# Patient Record
Sex: Female | Born: 1940 | Race: Black or African American | Hispanic: No | Smoking: Current some day smoker
Health system: Southern US, Community
[De-identification: ages and names within clinical notes are randomized; demographics above are authoritative.]

## PROBLEM LIST (undated history)

## (undated) DIAGNOSIS — J84112 Idiopathic pulmonary fibrosis: Secondary | ICD-10-CM

## (undated) DIAGNOSIS — E119 Type 2 diabetes mellitus without complications: Secondary | ICD-10-CM

## (undated) DIAGNOSIS — D696 Thrombocytopenia, unspecified: Secondary | ICD-10-CM

## (undated) DIAGNOSIS — E039 Hypothyroidism, unspecified: Secondary | ICD-10-CM

## (undated) DIAGNOSIS — I10 Essential (primary) hypertension: Secondary | ICD-10-CM

## (undated) DIAGNOSIS — E785 Hyperlipidemia, unspecified: Secondary | ICD-10-CM

## (undated) DIAGNOSIS — I4891 Unspecified atrial fibrillation: Secondary | ICD-10-CM

## (undated) HISTORY — DX: Thrombocytopenia, unspecified: D69.6

## (undated) HISTORY — PX: TUBAL LIGATION: SHX77

## (undated) HISTORY — DX: Hypothyroidism, unspecified: E03.9

## (undated) HISTORY — DX: Hyperlipidemia, unspecified: E78.5

## (undated) HISTORY — DX: Unspecified atrial fibrillation: I48.91

## (undated) HISTORY — DX: Type 2 diabetes mellitus without complications: E11.9

---

## 2003-03-20 ENCOUNTER — Inpatient Hospital Stay (HOSPITAL_COMMUNITY): Admission: EM | Admit: 2003-03-20 | Discharge: 2003-03-21 | Payer: Self-pay | Admitting: Emergency Medicine

## 2003-03-20 ENCOUNTER — Encounter: Payer: Self-pay | Admitting: Emergency Medicine

## 2003-03-21 ENCOUNTER — Encounter: Payer: Self-pay | Admitting: Cardiology

## 2007-06-25 ENCOUNTER — Other Ambulatory Visit: Admission: RE | Admit: 2007-06-25 | Discharge: 2007-06-25 | Payer: Self-pay | Admitting: *Deleted

## 2008-10-05 ENCOUNTER — Ambulatory Visit: Payer: Self-pay | Admitting: Hematology

## 2008-10-09 LAB — MORPHOLOGY
PLT EST: DECREASED
RBC Comments: NORMAL

## 2008-10-09 LAB — CBC WITH DIFFERENTIAL/PLATELET
BASO%: 0.1 % (ref 0.0–2.0)
Eosinophils Absolute: 0 10*3/uL (ref 0.0–0.5)
HCT: 45.9 % (ref 34.8–46.6)
MCHC: 34.5 g/dL (ref 32.0–36.0)
MONO#: 0.4 10*3/uL (ref 0.1–0.9)
NEUT#: 4.6 10*3/uL (ref 1.5–6.5)
NEUT%: 63.6 % (ref 39.6–76.8)
RBC: 4.43 10*6/uL (ref 3.70–5.32)
WBC: 7.2 10*3/uL (ref 3.9–10.0)
lymph#: 2.1 10*3/uL (ref 0.9–3.3)

## 2008-10-09 LAB — CHCC SMEAR

## 2008-10-13 LAB — SPEP & IFE WITH QIG
Albumin ELP: 53.6 % — ABNORMAL LOW (ref 55.8–66.1)
Alpha-2-Globulin: 12.5 % — ABNORMAL HIGH (ref 7.1–11.8)
Beta 2: 5.7 % (ref 3.2–6.5)
Beta Globulin: 5.8 % (ref 4.7–7.2)
IgA: 392 mg/dL — ABNORMAL HIGH (ref 68–378)

## 2008-10-13 LAB — TSH: TSH: 11.328 u[IU]/mL — ABNORMAL HIGH (ref 0.350–4.500)

## 2008-10-13 LAB — T4, FREE: Free T4: 0.65 ng/dL — ABNORMAL LOW (ref 0.89–1.80)

## 2008-10-13 LAB — VITAMIN B12: Vitamin B-12: 837 pg/mL (ref 211–911)

## 2008-10-20 ENCOUNTER — Ambulatory Visit (HOSPITAL_COMMUNITY): Admission: RE | Admit: 2008-10-20 | Discharge: 2008-10-20 | Payer: Self-pay | Admitting: Oncology

## 2008-10-26 ENCOUNTER — Encounter: Admission: RE | Admit: 2008-10-26 | Discharge: 2008-10-26 | Payer: Self-pay | Admitting: Family Medicine

## 2008-12-07 ENCOUNTER — Ambulatory Visit: Payer: Self-pay | Admitting: Hematology

## 2009-04-02 ENCOUNTER — Encounter (HOSPITAL_BASED_OUTPATIENT_CLINIC_OR_DEPARTMENT_OTHER): Admission: RE | Admit: 2009-04-02 | Discharge: 2009-07-01 | Payer: Self-pay | Admitting: General Surgery

## 2011-01-01 ENCOUNTER — Encounter: Payer: Self-pay | Admitting: Oncology

## 2011-04-25 NOTE — Assessment & Plan Note (Signed)
[facility]   NAME:  Elizabeth Melton, Elizabeth Melton NO.:  [id number]   MEDICAL RECORD NO.:  [id number]      DATE OF BIRTH:  June 08, 1941   PHYSICIAN:  Kathrin Penner, M.D.    VISIT DATE:  04/12/2009                                   OFFICE VISIT   PROBLEM:  Traumatic laceration of the right lower extremity over the  shin anteromedially, on last measurement was 0.9 x 0.5 x 0.1.   The patient is a diabetic female who hurt her leg on the [employer] stove.  The ulcer had been present previously for approximately 2 months.   Last treatment, she was selectively debrided and treated with Promogran  and hydrogel 3 times weekly.  The patient returns today for followup  evaluation.   PHYSICAL EXAMINATION:  VITAL SIGNS:  Temperature 99.1, pulse 84,  respirations 18, and blood pressure 147/75.  Capillary blood glucose  119.  The wound is now healed.  The base of the wound is completely  epithelized with return of melanin-pigmented cells.  There is no  erythema or edema.   ASSESSMENT:  Healed traumatic wound of the right anterior leg.   PLAN:  To discharge from clinic.  Followup p.r.n.      Kathrin Penner, M.D.  Electronically Signed     PB/MEDQ  D:  04/12/2009  T:  04/13/2009  Job:  [id number]

## 2011-04-25 NOTE — Assessment & Plan Note (Signed)
[facility]   NAME:  Elizabeth Melton, KIGER NO.:  [id number]   MEDICAL RECORD NO.:  [id number]      DATE OF BIRTH:  1941-03-24   PHYSICIAN:  Kathrin Penner, M.D.    VISIT DATE:  04/05/2009                                   OFFICE VISIT   PROBLEM:  Traumatic laceration of the right lower extremity (shin);  wound size 0.9 x 0.5 x 0.1 cm; onset of injury, March 2010.   The patient is a 70 year old female with history of recent onset of  diabetes mellitus, which is currently being controlled on diet.  The  patient noted that she cut her shin on the stove in March of this year  and now she has a nonhealing pretibial ulcer.   PAST MEDICAL HISTORY:  The patient has had no previous surgical  operations.   CURRENT MEDICATIONS:  1. Lovastatin 20 mg daily.  2. Levothyroxine 25 mcg daily.  3. Fish oil 1000 mg daily.  4. Vitamin D3 2000 units daily.  5. B complex.   This patient is a known recent diabetic.  She has a history of  hypertension.   ALLERGIES:  She has no known drug allergies.   SOCIAL HISTORY:  She is a married black female.  She smokes  approximately one-half pack of cigarettes daily.  She drinks alcohol two  to thrice 3 times weekly.   REVIEW OF SYSTEMS:  Twelve-point review of systems is carried out and is  negative, except as noted above.   PHYSICAL EXAMINATION:  GENERAL:  This patient is 5 feet 6 inches tall  and weight 160 pounds.  She has had some recent weight gain.  VITAL SIGNS:  Temperature is 99.1, pulse is 71, respirations 18, blood  pressure 144/76.  Her most recent capillary blood glucose is 162.  HEENT/NECK:  There is no thyromegaly and no cervical adenopathy.  No  carotid bruits.  No scleral icterus.  CARDIOPULMONARY:  Lungs are clear to auscultation bilaterally.  Heart  shows regular rate and rhythm without murmurs, rubs, or gallops.  ABDOMEN:  Normoactive bowel sounds with no palpable masses or  visceromegaly.  There  are no hernias noted.  EXTREMITIES:  Right pretibial ulcer with necrotic base.  No odor and  with minimal exudate.   TREATMENT:  Selective debridement of this ulcer following topical  anesthetic with lidocaine gel and Promogran hydrogel wound dressing, to  be changed 3 times weekly by the patient.   DISPOSITION:  Return to clinic in 1 week for wound care.  Referral to  the diabetic teaching service.      Kathrin Penner, M.D.  Electronically Signed     PB/MEDQ  D:  04/06/2009  T:  04/06/2009  Job:  [id number]

## 2011-04-26 ENCOUNTER — Other Ambulatory Visit (HOSPITAL_COMMUNITY)
Admission: RE | Admit: 2011-04-26 | Discharge: 2011-04-26 | Disposition: A | Discharge status: Home / Self Care | Payer: Medicare Other | Source: Ambulatory Visit | Attending: Family Medicine | Admitting: Family Medicine

## 2011-04-26 DIAGNOSIS — Z124 Encounter for screening for malignant neoplasm of cervix: Secondary | ICD-10-CM | POA: Insufficient documentation

## 2011-04-28 NOTE — Discharge Summary (Signed)
   NAMESAMANTHAMARIE, EZZELL NO.:  [id number]   MEDICAL RECORD NO.:  [id number]                   PATIENT TYPE:  INP   LOCATION:  2001                                 FACILITY:  [place]   PHYSICIAN:  Satira Sark, M.D. [facility]        DATE OF BIRTH:  Apr 10, 1941   DATE OF ADMISSION:  03/20/2003  DATE OF DISCHARGE:  03/21/2003                           DISCHARGE SUMMARY - REFERRING   DISCHARGE DIAGNOSES:  1. Chest pain.  2. Short PR interval.  3. Hyperthyroidism status post radioiodine ablation in 1980.  4. History of laceration to the tip of the right index finger secondary to     machine accident in the 1980s.  5. Tobacco abuse.   HOSPITAL COURSE:  The patient is a 70 year old female who was admitted on  03/20/2003 with a six month history of progressive angina.  She was admitted  on 03/20/2003 and underwent further cardiac testing which included a cardiac  enzymes which were negative.  Sodium 140, potassium 3.7, BUN 12, creatinine  1.0.  Hemoglobin 14.0, hematocrit 41.7.  Total cholesterol 183.  Triglycerides 117.  HDL 51, LDL 109.  TSH 21.205 and hemoglobin A-1C 6.2.  She underwent a stress Cardiolite that revealed no ischemia with a normal  ejection fraction.  She was also noted on the EKG to have a short PR  interval, and there was a question of __________  .  After an overnight stay  in the hospital the patient was discharged to home in stable condition, and  she will need to have a Holter and echo arranged in the office.  In addition  at discharge, her TSH with a stat hemoglobin A-1C status __________  needed  to be followed up as an outpatient.  She will be contacted by the office for  follow up appointment and at this time her labs will need to be reviewed  with her.   PREADMISSION MEDICATIONS:  She is to resume her medications that she was  taking prior to admission which included over the counter medicine.   DISPOSITION:  She is discharged  home in stable condition.      Joesphine Bare, P.A. [facility]                      Satira Sark, M.D. [facility]    LB/MEDQ  D:  04/15/2003  T:  04/15/2003  Job:  [id number]

## 2011-04-28 NOTE — H&P (Signed)
Elizabeth Melton, OYSTER NO.:  [id number]   MEDICAL RECORD NO.:  [id number]                   PATIENT TYPE:  EMS   LOCATION:  [facility]                                 FACILITY:  [place]   PHYSICIAN:  Satira Sark, M.D. [facility]        DATE OF BIRTH:  Jan 16, 1941   DATE OF ADMISSION:  03/20/2003  DATE OF DISCHARGE:                                HISTORY & PHYSICAL   PRIMARY CARE PHYSICIAN:  None.   CHIEF COMPLAINT:  Chest pain.   HISTORY OF PRESENT ILLNESS:  The patient is a 70 year old woman with a  longstanding history of tobacco abuse and reported previous history of  hyperthyroidism, status post radioiodine ablation in the late 1980s, who  presents with a six-month history of progressive chest discomfort.  She  describes a sensation of palpitations with pain in her left scapular area  which radiates around the left breast and ultimately to the left arm with a  feeling of paresthesia.  Her chest pain feels like a squeezing and is also  associated with dyspnea.  Typically these symptoms last about five minutes  and do not occur in any specific pattern or with any specific precipitant.  She was at work today and developed an episode that was more prolonged,  lasting up to three hours, which was unusual.  She presented to the  emergency department after being referred to urgent care.  At present, she  feels better.  She does not have any typical exertional symptomatology and  has no known history of coronary artery disease or myocardial infarction.  She does not have regular medical care and has had no prior screening for  coronary artery disease.   ALLERGIES:  No known drug allergies.   CURRENT MEDICATIONS:  No prescription medicines are taken.  The patient uses  over the counter sinus preparations.   PAST MEDICAL HISTORY:  1. Reported history of hyperthyroidism, status post radioiodine ablation in     the late 1980s.  The patient is not on Synthroid  replacement at this time     and she has not had her thyroid status checked in many years.  2. History of laceration with loss of the tip of the right index finger     secondary to a machine accident in the 1980s.  3. Unknown lipid status.  4. No known history of hypertension or type 2 diabetes mellitus.   SOCIAL HISTORY:  The patient is married and has three grown children.  She  lives in [place], [place].  She works at [employer] on an  [employer].  She has smoked one pack per day for approximately 45 years.  She drinks alcohol regularly, admitting to one or two drinks each evening  after work, perhaps more on the weekends.  She denies any illicit drug use.   FAMILY HISTORY:  Noncontributory for premature coronary artery disease,  arrhythmia, or sudden death.   REVIEW OF  SYSTEMS:  As described in the history of present illness.  The  patient complains of chronic pollen allergies and sinus congestion.  She has  had no recent fever or chills.  She denies productive cough, nausea, emesis,  melena, hematochezia, or changes in bowel or bladder habits.   PHYSICAL EXAMINATION:  VITAL SIGNS:  The blood pressure is 140/80, heart  rate 60 and regular, respirations 24 initially and down to 18, and the  patient is afebrile.  WEIGHT:  165 pounds.  HEIGHT:  5 feet 6 inches tall.  GENERAL APPEARANCE:  This is an overweight woman lying supine in no acute  distress.  HEENT:  Conjunctivae and lids normal.  The oropharynx is clear.  NECK:  Supple without elevated jugular venous pressure, thyromegaly, or  thyroid tenderness.  No carotid bruits are noted.  LUNGS:  Clear to auscultation bilaterally with no wheezing or rhonchi.  CARDIAC:  Regular rate and rhythm without S3 gallop or significant murmur.  There is no pericardial rub.  ABDOMEN:  Obese without hepatomegaly or bruits.  EXTREMITIES:  No significant cyanosis, clubbing, or edema.  Pulses are 2+.  SKIN:  No ulcerative changes  are noted.  MUSCULOSKELETAL:  No kyphosis is noted.  NEUROPSYCHIATRIC:  The patient is alert and oriented x 3.   LABORATORY DATA:  The 12-lead electrocardiogram today shows normal sinus  rhythm with a short PR interval measuring 129 msec.  There are no clear  delta waves noted.  There are nonspecific inferolateral T-wave changes with  predominantly flattening, but some inversion in lead 3.   The initial CK was 304, CK-MB 4.1, relative index 1.3, and troponin I less  than 0.01.  BUN 7, creatinine 0.9.  Liver function tests are normal.  The  potassium is 3.9.  The INR is 0.9.  The WBC is 6.9, hemoglobin 14.2, and  platelets 151.   The chest x-ray is currently pending.   IMPRESSION:  1. Progressive chest pain syndrome over the last six months with typical and     atypical features in a 70 year old woman with longstanding tobacco use     and reported history of prior hyperthyroidism, status post radioiodine     ablation.  She had a more prolonged episode today at rest.  The     electrocardiogram is nonspecific, but does show a short PR interval with     no clear delta waves.  She has had no arrhythmias on telemetry.  The     initial troponin I is negative.  2. Unknown lipid status.  3. Mildly elevated glucose at 120 on random check today.  The patient denies     any prior history of type 2 diabetes mellitus.  4. Unknown thyroid status following remote radioiodine ablation for     hyperthyroidism in the late 1980s.   PLAN:  1. Will admit the patient to telemetry, cycle cardiac markers, and     tentatively plan on an exercise Cardiolite for tomorrow, assuming the     patient rules out for myocardial infarction.  2. Follow telemetry for any specific arrhythmias, specifically given the     short PR interval.  There is a possibility of a preexcitation syndrome,     although this is not clearly defined at this time. 3. Would check a TSH, fasting lipid profile, and a hemoglobin A1C.  4.  May ultimately need an event monitor and 2-D echocardiography as an     outpatient if the above work-up is reassuring.  Satira Sark, M.D. [facility]    SGM/MEDQ  D:  03/20/2003  T:  03/20/2003  Job:  [id number]

## 2012-08-26 ENCOUNTER — Other Ambulatory Visit: Payer: Self-pay

## 2012-08-26 DIAGNOSIS — I739 Peripheral vascular disease, unspecified: Secondary | ICD-10-CM

## 2012-09-20 ENCOUNTER — Encounter: Payer: Self-pay | Admitting: Vascular Surgery

## 2012-09-23 ENCOUNTER — Encounter: Payer: Self-pay | Admitting: Vascular Surgery

## 2012-09-24 ENCOUNTER — Encounter (INDEPENDENT_AMBULATORY_CARE_PROVIDER_SITE_OTHER): Payer: Medicare Other | Admitting: *Deleted

## 2012-09-24 ENCOUNTER — Encounter: Payer: Self-pay | Admitting: Vascular Surgery

## 2012-09-24 ENCOUNTER — Ambulatory Visit (INDEPENDENT_AMBULATORY_CARE_PROVIDER_SITE_OTHER): Payer: Medicare Other | Admitting: Vascular Surgery

## 2012-09-24 VITALS — BP 167/75 | HR 71 | Resp 20 | Ht 66.5 in | Wt 160.0 lb

## 2012-09-24 DIAGNOSIS — M79606 Pain in leg, unspecified: Secondary | ICD-10-CM | POA: Insufficient documentation

## 2012-09-24 DIAGNOSIS — I739 Peripheral vascular disease, unspecified: Secondary | ICD-10-CM

## 2012-09-24 DIAGNOSIS — M79609 Pain in unspecified limb: Secondary | ICD-10-CM

## 2012-09-24 NOTE — Progress Notes (Signed)
[facility]   Patient name: Elizabeth Melton MRN: [id number] DOB: 09-Jan-1941 Sex: female   Referred by: Drema Dallas  Reason for referral:  Chief Complaint  Patient presents with  . New Evaluation    C/O LEG PAIN AND CRAMPING    HISTORY OF PRESENT ILLNESS: The patient reports that he had diffuse unusual complex of lower extremity complaints. She reports feeling as though something is moving inside the calf muscles on her right leg greater than left. It appears though she is describing muscle spasms or fasciculation in her calf. She also has complaints of numbness in both thighs. This is greater on the left side in the right. She reports this is worse when she is standing and reports no relationship with this with walking. She was a reports fairly typical bilateral night cramps. Does have a history of prior diabetes and hypertension. No history of DVT or lower extremity tissue loss.  Past Medical History  Diagnosis Date  . Hypothyroidism   . Hyperlipidemia   . Diabetes mellitus without complication   . Thrombocytopenia     Past Surgical History  Procedure Date  . Tubal ligation     History   Social History  . Marital Status: Married    Spouse Name: N/A    Number of Children: N/A  . Years of Education: N/A   Occupational History  . Not on file.   Social History Main Topics  . Smoking status: Current Some Day Smoker -- 50 years    Types: Cigarettes  . Smokeless tobacco: Former Systems developer    Quit date: 09/24/1962  . Alcohol Use: Yes  . Drug Use: No  . Sexually Active: Not on file   Other Topics Concern  . Not on file   Social History Narrative  . No narrative on file    Family History  Problem Relation Age of Onset  . Diabetes Mother   . Kidney disease Mother   . Other Father     tuberculosis    Allergies as of 09/24/2012  . (No Known Allergies)    Current Outpatient Prescriptions on File Prior to Visit  Medication Sig Dispense  Refill  . acetaminophen (TYLENOL) 500 MG tablet Take 500 mg by mouth every 6 (six) hours as needed.      Marland Kitchen b complex vitamins tablet Take 1 tablet by mouth daily.      . Calcium Carb-Cholecalciferol (CALCIUM + D3) 600-200 MG-UNIT TABS Take by mouth.      Marland Kitchen glucose blood test strip 1 each by Other route as needed. Use as instructed      . levothyroxine (SYNTHROID, LEVOTHROID) 75 MCG tablet Take 75 mcg by mouth daily.      Marland Kitchen lovastatin (MEVACOR) 40 MG tablet Take 40 mg by mouth at bedtime.      Marland Kitchen SMART SENSE THIN LANCETS 26G MISC by Does not apply route.         REVIEW OF SYSTEMS:  Positives indicated with an "X"  CARDIOVASCULAR:  _0  chest pain   _1  chest pressure   _2  palpitations   _3  orthopnea   _4  dyspnea on exertion   _5  claudication   _6  rest pain   _7  DVT   _8  phlebitis PULMONARY:   _9  productive cough   _10  asthma   _11  wheezing NEUROLOGIC:   _12  weakness  [x ] paresthesias  _13  aphasia  _14   amaurosis  [x ] dizziness HEMATOLOGIC:   _0  bleeding problems   _1  clotting disorders MUSCULOSKELETAL:  _2  joint pain   _3  joint swelling GASTROINTESTINAL: _4   blood in stool  _5   hematemesis GENITOURINARY:  _6   dysuria  _7   hematuria PSYCHIATRIC:  _8  history of major depression INTEGUMENTARY:  [x ] rashes  _9  ulcers CONSTITUTIONAL:  _10  fever   [x ] chills  PHYSICAL EXAMINATION:  General: The patient is a well-nourished female, in no acute distress. Vital signs are BP 167/75  Pulse 71  Resp 20  Ht 5' 6.5" (1.689 m)  Wt 160 lb (72.576 kg)  BMI 25.44 kg/m2 Pulmonary: There is a good air exchange bilaterally without wheezing or rales. Abdomen: Soft and non-tender with normal pitch bowel sounds. Musculoskeletal: There are no major deformities.  There is no significant extremity pain. Neurologic: No focal weakness or paresthesias are detected, Skin: There are no ulcer or rashes noted. Psychiatric: The patient has normal affect. Cardiovascular: There is a regular  rate and rhythm without significant murmur appreciated. Pulse status: 2+ radial pulses bilaterally. She has 2+ femoral pulses bilaterally. 2+ left dorsalis pedis pulse and 1+ left posterior tibial pulse. No palpable pedal pulses on the right  I have reviewed lower extremity ankle arm indices from inside imaging on 06/12/2012. Right ankle arm index was 0.5 left 0.78 [facility] Vascular Lab Studies:  Ordered and Independently Reviewed she had duplex imaging of her lower trim of these. This reveals bilateral superficial femoral artery stenoses greater in the right than on the left. She has biphasic waveforms at the tibial vessels on the left and monophasic on the right.  Impression and Plan:  Had a long discussion with the patient. I do not feel that any of her current symptoms are related to her superficial femoral occlusive disease. I explained that certainly would not explain her numbness in her thighs and the fasciculations in her calves. On further questioning she specifically denies any claudication type symptoms. I reassured her that she is a not in the limb threatening ischemia or level of arterial insufficiency. I explained risk factor modification such as smoking cessation. Patient understands. She will notify should she develop any tissue loss or worsening symptoms. Otherwise she will see [place] on an as-needed basis    Emigdio Wildeman [facility] Office: [phone]

## 2014-03-22 ENCOUNTER — Encounter: Payer: Self-pay | Admitting: *Deleted

## 2014-03-22 DIAGNOSIS — E119 Type 2 diabetes mellitus without complications: Secondary | ICD-10-CM

## 2014-03-22 DIAGNOSIS — E1169 Type 2 diabetes mellitus with other specified complication: Secondary | ICD-10-CM | POA: Insufficient documentation

## 2014-03-22 DIAGNOSIS — E1165 Type 2 diabetes mellitus with hyperglycemia: Secondary | ICD-10-CM | POA: Insufficient documentation

## 2014-03-22 DIAGNOSIS — E039 Hypothyroidism, unspecified: Secondary | ICD-10-CM | POA: Insufficient documentation

## 2014-03-22 DIAGNOSIS — E785 Hyperlipidemia, unspecified: Secondary | ICD-10-CM

## 2014-12-16 ENCOUNTER — Other Ambulatory Visit: Payer: Self-pay | Admitting: Family Medicine

## 2014-12-16 ENCOUNTER — Ambulatory Visit
Admission: RE | Admit: 2014-12-16 | Discharge: 2014-12-16 | Disposition: A | Discharge status: Home / Self Care | Payer: Commercial Managed Care - HMO | Source: Ambulatory Visit | Attending: Family Medicine | Admitting: Family Medicine

## 2014-12-16 DIAGNOSIS — L989 Disorder of the skin and subcutaneous tissue, unspecified: Secondary | ICD-10-CM

## 2014-12-16 DIAGNOSIS — M7989 Other specified soft tissue disorders: Secondary | ICD-10-CM | POA: Diagnosis not present

## 2015-04-22 DIAGNOSIS — M899 Disorder of bone, unspecified: Secondary | ICD-10-CM | POA: Diagnosis not present

## 2015-04-22 DIAGNOSIS — Z1231 Encounter for screening mammogram for malignant neoplasm of breast: Secondary | ICD-10-CM | POA: Diagnosis not present

## 2015-06-07 DIAGNOSIS — E039 Hypothyroidism, unspecified: Secondary | ICD-10-CM | POA: Diagnosis not present

## 2015-06-07 DIAGNOSIS — E119 Type 2 diabetes mellitus without complications: Secondary | ICD-10-CM | POA: Diagnosis not present

## 2015-06-07 DIAGNOSIS — E559 Vitamin D deficiency, unspecified: Secondary | ICD-10-CM | POA: Diagnosis not present

## 2015-06-07 DIAGNOSIS — Z1211 Encounter for screening for malignant neoplasm of colon: Secondary | ICD-10-CM | POA: Diagnosis not present

## 2015-06-07 DIAGNOSIS — Z0000 Encounter for general adult medical examination without abnormal findings: Secondary | ICD-10-CM | POA: Diagnosis not present

## 2015-06-07 DIAGNOSIS — E785 Hyperlipidemia, unspecified: Secondary | ICD-10-CM | POA: Diagnosis not present

## 2015-06-07 DIAGNOSIS — D696 Thrombocytopenia, unspecified: Secondary | ICD-10-CM | POA: Diagnosis not present

## 2015-06-07 DIAGNOSIS — Z1389 Encounter for screening for other disorder: Secondary | ICD-10-CM | POA: Diagnosis not present

## 2015-09-09 DIAGNOSIS — R7989 Other specified abnormal findings of blood chemistry: Secondary | ICD-10-CM | POA: Diagnosis not present

## 2015-09-09 DIAGNOSIS — E119 Type 2 diabetes mellitus without complications: Secondary | ICD-10-CM | POA: Diagnosis not present

## 2015-09-09 DIAGNOSIS — E559 Vitamin D deficiency, unspecified: Secondary | ICD-10-CM | POA: Diagnosis not present

## 2015-09-09 DIAGNOSIS — R740 Nonspecific elevation of levels of transaminase and lactic acid dehydrogenase [LDH]: Secondary | ICD-10-CM | POA: Diagnosis not present

## 2016-01-06 DIAGNOSIS — M25569 Pain in unspecified knee: Secondary | ICD-10-CM | POA: Diagnosis not present

## 2016-01-10 DIAGNOSIS — M76892 Other specified enthesopathies of left lower limb, excluding foot: Secondary | ICD-10-CM | POA: Diagnosis not present

## 2016-01-10 DIAGNOSIS — M1712 Unilateral primary osteoarthritis, left knee: Secondary | ICD-10-CM | POA: Diagnosis not present

## 2016-01-10 DIAGNOSIS — M170 Bilateral primary osteoarthritis of knee: Secondary | ICD-10-CM | POA: Diagnosis not present

## 2016-01-19 DIAGNOSIS — M1712 Unilateral primary osteoarthritis, left knee: Secondary | ICD-10-CM | POA: Diagnosis not present

## 2016-01-24 DIAGNOSIS — M1712 Unilateral primary osteoarthritis, left knee: Secondary | ICD-10-CM | POA: Diagnosis not present

## 2016-01-26 DIAGNOSIS — M1712 Unilateral primary osteoarthritis, left knee: Secondary | ICD-10-CM | POA: Diagnosis not present

## 2016-01-31 DIAGNOSIS — M1712 Unilateral primary osteoarthritis, left knee: Secondary | ICD-10-CM | POA: Diagnosis not present

## 2016-02-02 DIAGNOSIS — M1712 Unilateral primary osteoarthritis, left knee: Secondary | ICD-10-CM | POA: Diagnosis not present

## 2016-02-21 DIAGNOSIS — M76892 Other specified enthesopathies of left lower limb, excluding foot: Secondary | ICD-10-CM | POA: Diagnosis not present

## 2016-02-21 DIAGNOSIS — M1712 Unilateral primary osteoarthritis, left knee: Secondary | ICD-10-CM | POA: Diagnosis not present

## 2016-05-19 DIAGNOSIS — Z1231 Encounter for screening mammogram for malignant neoplasm of breast: Secondary | ICD-10-CM | POA: Diagnosis not present

## 2016-06-07 DIAGNOSIS — E785 Hyperlipidemia, unspecified: Secondary | ICD-10-CM | POA: Diagnosis not present

## 2016-06-07 DIAGNOSIS — E039 Hypothyroidism, unspecified: Secondary | ICD-10-CM | POA: Diagnosis not present

## 2016-06-07 DIAGNOSIS — D696 Thrombocytopenia, unspecified: Secondary | ICD-10-CM | POA: Diagnosis not present

## 2016-06-07 DIAGNOSIS — Z1389 Encounter for screening for other disorder: Secondary | ICD-10-CM | POA: Diagnosis not present

## 2016-06-07 DIAGNOSIS — Z0000 Encounter for general adult medical examination without abnormal findings: Secondary | ICD-10-CM | POA: Diagnosis not present

## 2016-06-07 DIAGNOSIS — E559 Vitamin D deficiency, unspecified: Secondary | ICD-10-CM | POA: Diagnosis not present

## 2016-06-07 DIAGNOSIS — H538 Other visual disturbances: Secondary | ICD-10-CM | POA: Diagnosis not present

## 2016-06-07 DIAGNOSIS — R011 Cardiac murmur, unspecified: Secondary | ICD-10-CM | POA: Diagnosis not present

## 2016-06-07 DIAGNOSIS — E119 Type 2 diabetes mellitus without complications: Secondary | ICD-10-CM | POA: Diagnosis not present

## 2016-06-14 ENCOUNTER — Other Ambulatory Visit (HOSPITAL_COMMUNITY): Payer: Self-pay | Admitting: Family Medicine

## 2016-06-14 DIAGNOSIS — R011 Cardiac murmur, unspecified: Secondary | ICD-10-CM

## 2016-06-29 ENCOUNTER — Ambulatory Visit (HOSPITAL_COMMUNITY): Payer: Commercial Managed Care - HMO | Attending: Cardiology

## 2016-06-29 ENCOUNTER — Other Ambulatory Visit: Payer: Self-pay

## 2016-06-29 DIAGNOSIS — I340 Nonrheumatic mitral (valve) insufficiency: Secondary | ICD-10-CM | POA: Insufficient documentation

## 2016-06-29 DIAGNOSIS — R011 Cardiac murmur, unspecified: Secondary | ICD-10-CM | POA: Diagnosis not present

## 2016-06-29 DIAGNOSIS — I351 Nonrheumatic aortic (valve) insufficiency: Secondary | ICD-10-CM | POA: Insufficient documentation

## 2016-06-29 DIAGNOSIS — E119 Type 2 diabetes mellitus without complications: Secondary | ICD-10-CM | POA: Insufficient documentation

## 2016-06-29 DIAGNOSIS — I517 Cardiomegaly: Secondary | ICD-10-CM | POA: Diagnosis not present

## 2016-06-29 DIAGNOSIS — E785 Hyperlipidemia, unspecified: Secondary | ICD-10-CM | POA: Diagnosis not present

## 2016-06-29 DIAGNOSIS — Z720 Tobacco use: Secondary | ICD-10-CM | POA: Diagnosis not present

## 2016-08-07 DIAGNOSIS — E119 Type 2 diabetes mellitus without complications: Secondary | ICD-10-CM | POA: Diagnosis not present

## 2017-04-30 DIAGNOSIS — M810 Age-related osteoporosis without current pathological fracture: Secondary | ICD-10-CM | POA: Diagnosis not present

## 2017-04-30 DIAGNOSIS — M85852 Other specified disorders of bone density and structure, left thigh: Secondary | ICD-10-CM | POA: Diagnosis not present

## 2017-05-21 DIAGNOSIS — Z1231 Encounter for screening mammogram for malignant neoplasm of breast: Secondary | ICD-10-CM | POA: Diagnosis not present

## 2017-06-19 DIAGNOSIS — Z0000 Encounter for general adult medical examination without abnormal findings: Secondary | ICD-10-CM | POA: Diagnosis not present

## 2017-06-19 DIAGNOSIS — E785 Hyperlipidemia, unspecified: Secondary | ICD-10-CM | POA: Diagnosis not present

## 2017-06-19 DIAGNOSIS — E039 Hypothyroidism, unspecified: Secondary | ICD-10-CM | POA: Diagnosis not present

## 2017-06-19 DIAGNOSIS — Z1389 Encounter for screening for other disorder: Secondary | ICD-10-CM | POA: Diagnosis not present

## 2017-06-19 DIAGNOSIS — Z720 Tobacco use: Secondary | ICD-10-CM | POA: Diagnosis not present

## 2017-06-19 DIAGNOSIS — M810 Age-related osteoporosis without current pathological fracture: Secondary | ICD-10-CM | POA: Diagnosis not present

## 2017-06-19 DIAGNOSIS — E119 Type 2 diabetes mellitus without complications: Secondary | ICD-10-CM | POA: Diagnosis not present

## 2017-06-19 DIAGNOSIS — D696 Thrombocytopenia, unspecified: Secondary | ICD-10-CM | POA: Diagnosis not present

## 2017-06-19 DIAGNOSIS — E559 Vitamin D deficiency, unspecified: Secondary | ICD-10-CM | POA: Diagnosis not present

## 2017-07-16 DIAGNOSIS — E119 Type 2 diabetes mellitus without complications: Secondary | ICD-10-CM | POA: Diagnosis not present

## 2017-07-16 DIAGNOSIS — E559 Vitamin D deficiency, unspecified: Secondary | ICD-10-CM | POA: Diagnosis not present

## 2017-07-16 DIAGNOSIS — Z79899 Other long term (current) drug therapy: Secondary | ICD-10-CM | POA: Diagnosis not present

## 2018-02-19 DIAGNOSIS — Z1211 Encounter for screening for malignant neoplasm of colon: Secondary | ICD-10-CM | POA: Diagnosis not present

## 2018-02-19 DIAGNOSIS — Z1212 Encounter for screening for malignant neoplasm of rectum: Secondary | ICD-10-CM | POA: Diagnosis not present

## 2018-05-28 DIAGNOSIS — Z1231 Encounter for screening mammogram for malignant neoplasm of breast: Secondary | ICD-10-CM | POA: Diagnosis not present

## 2018-06-19 DIAGNOSIS — Z1389 Encounter for screening for other disorder: Secondary | ICD-10-CM | POA: Diagnosis not present

## 2018-06-19 DIAGNOSIS — E039 Hypothyroidism, unspecified: Secondary | ICD-10-CM | POA: Diagnosis not present

## 2018-06-19 DIAGNOSIS — D696 Thrombocytopenia, unspecified: Secondary | ICD-10-CM | POA: Diagnosis not present

## 2018-06-19 DIAGNOSIS — E119 Type 2 diabetes mellitus without complications: Secondary | ICD-10-CM | POA: Diagnosis not present

## 2018-06-19 DIAGNOSIS — E785 Hyperlipidemia, unspecified: Secondary | ICD-10-CM | POA: Diagnosis not present

## 2018-06-19 DIAGNOSIS — Z0000 Encounter for general adult medical examination without abnormal findings: Secondary | ICD-10-CM | POA: Diagnosis not present

## 2018-06-19 DIAGNOSIS — E559 Vitamin D deficiency, unspecified: Secondary | ICD-10-CM | POA: Diagnosis not present

## 2018-06-19 DIAGNOSIS — Z720 Tobacco use: Secondary | ICD-10-CM | POA: Diagnosis not present

## 2018-06-19 DIAGNOSIS — M810 Age-related osteoporosis without current pathological fracture: Secondary | ICD-10-CM | POA: Diagnosis not present

## 2018-07-25 ENCOUNTER — Encounter: Payer: Self-pay | Admitting: Cardiology

## 2018-08-01 ENCOUNTER — Telehealth: Payer: Self-pay | Admitting: Acute Care

## 2018-08-06 NOTE — Telephone Encounter (Signed)
Called and spoke with pt who stated she was wanting to know when her appt was supposed to be that Dr. Drema Dallas called to set up.  Stated to pt that I am going to send the message to Doroteo Glassman for her to call pt to let her know when the visit with Judson Roch will be.  Am also going to route this to Eric Form, NP as well.

## 2018-08-06 NOTE — Telephone Encounter (Signed)
Langley Gauss would you please follow up on this? Do we have a referral for the screening program?? Thanks so much.

## 2018-08-07 ENCOUNTER — Other Ambulatory Visit: Payer: Self-pay | Admitting: Acute Care

## 2018-08-07 DIAGNOSIS — F17210 Nicotine dependence, cigarettes, uncomplicated: Secondary | ICD-10-CM

## 2018-08-07 DIAGNOSIS — Z122 Encounter for screening for malignant neoplasm of respiratory organs: Secondary | ICD-10-CM

## 2018-08-07 NOTE — Telephone Encounter (Signed)
Referral is in workque.  I have Left message for pt to call to schedule.  Will close this message and refer to referral notes.

## 2018-08-15 ENCOUNTER — Encounter: Payer: Self-pay | Admitting: Acute Care

## 2018-08-15 ENCOUNTER — Ambulatory Visit (INDEPENDENT_AMBULATORY_CARE_PROVIDER_SITE_OTHER): Payer: Medicare HMO | Admitting: Acute Care

## 2018-08-15 ENCOUNTER — Ambulatory Visit (INDEPENDENT_AMBULATORY_CARE_PROVIDER_SITE_OTHER)
Admission: RE | Admit: 2018-08-15 | Discharge: 2018-08-15 | Disposition: A | Discharge status: Home / Self Care | Payer: Medicare HMO | Source: Ambulatory Visit | Attending: Acute Care | Admitting: Acute Care

## 2018-08-15 DIAGNOSIS — F17210 Nicotine dependence, cigarettes, uncomplicated: Secondary | ICD-10-CM

## 2018-08-15 DIAGNOSIS — Z87891 Personal history of nicotine dependence: Secondary | ICD-10-CM

## 2018-08-15 DIAGNOSIS — Z122 Encounter for screening for malignant neoplasm of respiratory organs: Secondary | ICD-10-CM

## 2018-08-15 NOTE — Progress Notes (Signed)
Shared Decision Making Visit Lung Cancer Screening Program [phone])   Eligibility:  Age 77 y.o.  Pack Years Smoking History Calculation 45 pack year smoking history (# packs/per year x # years smoked)  Recent History of coughing up blood  no  Unexplained weight loss? no ( >Than 15 pounds within the last 6 months )  Prior History Lung / other cancer no (Diagnosis within the last 5 years already requiring surveillance chest CT Scans).  Smoking Status Current Smoker  Former Smokers: Years since quit: NA  Quit Date: NA  Visit Components:  Discussion included one or more decision making aids. yes  Discussion included risk/benefits of screening. yes  Discussion included potential follow up diagnostic testing for abnormal scans. yes  Discussion included meaning and risk of over diagnosis. yes  Discussion included meaning and risk of False Positives. yes  Discussion included meaning of total radiation exposure. yes  Counseling Included:  Importance of adherence to annual lung cancer LDCT screening. yes  Impact of comorbidities on ability to participate in the program. yes  Ability and willingness to under diagnostic treatment. yes  Smoking Cessation Counseling:  Current Smokers:   Discussed importance of smoking cessation. yes  Information about tobacco cessation classes and interventions provided to patient. yes  Patient provided with "ticket" for LDCT Scan. yes  Symptomatic Patient. no  Counseling  Diagnosis Code: Tobacco Use Z72.0  Asymptomatic Patient yes  Counseling (Intermediate counseling: > three minutes counseling) V7846  Former Smokers:   Discussed the importance of maintaining cigarette abstinence. yes  Diagnosis Code: Personal History of Nicotine Dependence. N62.952  Information about tobacco cessation classes and interventions provided to patient. Yes  Patient provided with "ticket" for LDCT Scan. yes  Written Order for Lung Cancer  Screening with LDCT placed in Epic. Yes (CT Chest Lung Cancer Screening Low Dose W/O CM) [id number] Z12.2-Screening of respiratory organs Z87.891-Personal history of nicotine dependence  I have spent 25 minutes of face to face time with Ms. Lemme discussing the risks and benefits of lung cancer screening. We viewed a power point together that explained in detail the above noted topics. We paused at intervals to allow for questions to be asked and answered to ensure understanding.We discussed that the single most powerful action that she can take to decrease her risk of developing lung cancer is to quit smoking. We discussed whether or not she is ready to commit to setting a quit date. We discussed options for tools to aid in quitting smoking including nicotine replacement therapy, non-nicotine medications, support groups, Quit Smart classes, and behavior modification. We discussed that often times setting smaller, more achievable goals, such as eliminating 1 cigarette a day for a week and then 2 cigarettes a day for a week can be helpful in slowly decreasing the number of cigarettes smoked. This allows for a sense of accomplishment as well as providing a clinical benefit. I gave her the " Be Stronger Than Your Excuses" card with contact information for community resources, classes, free nicotine replacement therapy, and access to mobile apps, text messaging, and on-line smoking cessation help. I have also given her my card and contact information in the event she needs to contact me. We discussed the time and location of the scan, and that either Doroteo Glassman RN or I will call with the results within 24-48 hours of receiving them. I have offered her  a copy of the power point we viewed  as a resource in the event they need reinforcement of  the concepts we discussed today in the office. The patient verbalized understanding of all of  the above and had no further questions upon leaving the office. They have my  contact information in the event they have any further questions.  I spent 4 minutes counseling on smoking cessation and the health risks of continued tobacco abuse.  I explained to the patient that there has been a high incidence of coronary artery disease noted on these exams. I explained that this is a non-gated exam therefore degree or severity cannot be determined. This patient is on statin therapy. I have asked the patient to follow-up with their PCP regarding any incidental finding of coronary artery disease and management with diet or medication as their PCP  feels is clinically indicated. The patient verbalized understanding of the above and had no further questions upon completion of the visit.      Magdalen Spatz, NP 08/15/2018 12:05 PM

## 2018-08-16 ENCOUNTER — Encounter: Payer: Self-pay | Admitting: Cardiology

## 2018-08-16 ENCOUNTER — Ambulatory Visit: Payer: Medicare HMO | Admitting: Cardiology

## 2018-08-16 VITALS — BP 158/74 | HR 72 | Ht 66.0 in | Wt 188.4 lb

## 2018-08-16 DIAGNOSIS — I10 Essential (primary) hypertension: Secondary | ICD-10-CM

## 2018-08-16 DIAGNOSIS — M79606 Pain in leg, unspecified: Secondary | ICD-10-CM | POA: Diagnosis not present

## 2018-08-16 DIAGNOSIS — E118 Type 2 diabetes mellitus with unspecified complications: Secondary | ICD-10-CM | POA: Diagnosis not present

## 2018-08-16 DIAGNOSIS — I351 Nonrheumatic aortic (valve) insufficiency: Secondary | ICD-10-CM

## 2018-08-16 DIAGNOSIS — R0602 Shortness of breath: Secondary | ICD-10-CM | POA: Diagnosis not present

## 2018-08-16 DIAGNOSIS — E785 Hyperlipidemia, unspecified: Secondary | ICD-10-CM | POA: Diagnosis not present

## 2018-08-16 DIAGNOSIS — Z720 Tobacco use: Secondary | ICD-10-CM | POA: Diagnosis not present

## 2018-08-16 NOTE — Patient Instructions (Addendum)
Medication Instructions: Your physician recommends that you continue on your current medications as directed. Please refer to the Current Medication list given to you today.   Labwork: None  Procedures/Testing: Your physician has requested that you have an echocardiogram. Echocardiography is a painless test that uses sound waves to create images of your heart. It provides your doctor with information about the size and shape of your heart and how well your heart's chambers and valves are working. This procedure takes approximately one hour. There are no restrictions for this procedure.   Your physician has requested that you have a lexiscan myoview. For further information please visit [email or web address]. Please follow instruction sheet, as given.   Your physician has requested that you have an ankle brachial index (ABI). During this test an ultrasound and blood pressure cuff are used to evaluate the arteries that supply the arms and legs with blood. Allow thirty minutes for this exam. There are no restrictions or special instructions.    Follow-Up: Your physician recommends that you schedule a follow-up appointment in: 1 month with Dr. Meda Coffee or Pecolia Ades NP   Any Additional Special Instructions Will Be Listed Below (If Applicable).    DASH Eating Plan DASH stands for "Dietary Approaches to Stop Hypertension." The DASH eating plan is a healthy eating plan that has been shown to reduce high blood pressure (hypertension). It may also reduce your risk for type 2 diabetes, heart disease, and stroke. The DASH eating plan may also help with weight loss. What are tips for following this plan? General guidelines  Avoid eating more than 2,300 mg (milligrams) of salt (sodium) a day. If you have hypertension, you may need to reduce your sodium intake to 1,500 mg a day.  Limit alcohol intake to no more than 1 drink a day for nonpregnant women and 2 drinks a day for men. One drink equals 12  oz of beer, 5 oz of wine, or 1 oz of hard liquor.  Work with your health care provider to maintain a healthy body weight or to lose weight. Ask what an ideal weight is for you.  Get at least 30 minutes of exercise that causes your heart to beat faster (aerobic exercise) most days of the week. Activities may include walking, swimming, or biking.  Work with your health care provider or diet and nutrition specialist (dietitian) to adjust your eating plan to your individual calorie needs. Reading food labels  Check food labels for the amount of sodium per serving. Choose foods with less than 5 percent of the Daily Value of sodium. Generally, foods with less than 300 mg of sodium per serving fit into this eating plan.  To find whole grains, look for the word "whole" as the first word in the ingredient list. Shopping  Buy products labeled as "low-sodium" or "no salt added."  Buy fresh foods. Avoid canned foods and premade or frozen meals. Cooking  Avoid adding salt when cooking. Use salt-free seasonings or herbs instead of table salt or sea salt. Check with your health care provider or pharmacist before using salt substitutes.  Do not fry foods. Cook foods using healthy methods such as baking, boiling, grilling, and broiling instead.  Cook with heart-healthy oils, such as olive, canola, soybean, or sunflower oil. Meal planning   Eat a balanced diet that includes: ? 5 or more servings of fruits and vegetables each day. At each meal, try to fill half of your plate with fruits and vegetables. ? Up to 6-8 servings  of whole grains each day. ? Less than 6 oz of lean meat, poultry, or fish each day. A 3-oz serving of meat is about the same size as a deck of cards. One egg equals 1 oz. ? 2 servings of low-fat dairy each day. ? A serving of nuts, seeds, or beans 5 times each week. ? Heart-healthy fats. Healthy fats called Omega-3 fatty acids are found in foods such as flaxseeds and coldwater fish,  like sardines, salmon, and mackerel.  Limit how much you eat of the following: ? Canned or prepackaged foods. ? Food that is high in trans fat, such as fried foods. ? Food that is high in saturated fat, such as fatty meat. ? Sweets, desserts, sugary drinks, and other foods with added sugar. ? Full-fat dairy products.  Do not salt foods before eating.  Try to eat at least 2 vegetarian meals each week.  Eat more home-cooked food and less restaurant, buffet, and fast food.  When eating at a restaurant, ask that your food be prepared with less salt or no salt, if possible. What foods are recommended? The items listed may not be a complete list. Talk with your dietitian about what dietary choices are best for you. Grains Whole-grain or whole-wheat bread. Whole-grain or whole-wheat pasta. Brown rice. [place] Morrow. Bulgur. Whole-grain and low-sodium cereals. Pita bread. Low-fat, low-sodium crackers. Whole-wheat flour tortillas. Vegetables Fresh or frozen vegetables (raw, steamed, roasted, or grilled). Low-sodium or reduced-sodium tomato and vegetable juice. Low-sodium or reduced-sodium tomato sauce and tomato paste. Low-sodium or reduced-sodium canned vegetables. Fruits All fresh, dried, or frozen fruit. Canned fruit in natural juice (without added sugar). Meat and other protein foods Skinless chicken or [place]. Ground chicken or [place]. Pork with fat trimmed off. Fish and seafood. Egg whites. Dried beans, peas, or lentils. Unsalted nuts, nut butters, and seeds. Unsalted canned beans. Lean cuts of beef with fat trimmed off. Low-sodium, lean deli meat. Dairy Low-fat (1%) or fat-free (skim) milk. Fat-free, low-fat, or reduced-fat cheeses. Nonfat, low-sodium ricotta or cottage cheese. Low-fat or nonfat yogurt. Low-fat, low-sodium cheese. Fats and oils Soft margarine without trans fats. Vegetable oil. Low-fat, reduced-fat, or light mayonnaise and salad dressings (reduced-sodium). Canola,  safflower, olive, soybean, and sunflower oils. Avocado. Seasoning and other foods Herbs. Spices. Seasoning mixes without salt. Unsalted popcorn and pretzels. Fat-free sweets. What foods are not recommended? The items listed may not be a complete list. Talk with your dietitian about what dietary choices are best for you. Grains Baked goods made with fat, such as croissants, muffins, or some breads. Dry pasta or rice meal packs. Vegetables Creamed or fried vegetables. Vegetables in a cheese sauce. Regular canned vegetables (not low-sodium or reduced-sodium). Regular canned tomato sauce and paste (not low-sodium or reduced-sodium). Regular tomato and vegetable juice (not low-sodium or reduced-sodium). Angie Fava. Olives. Fruits Canned fruit in a light or heavy syrup. Fried fruit. Fruit in cream or butter sauce. Meat and other protein foods Fatty cuts of meat. Ribs. Fried meat. Berniece Salines. Sausage. Bologna and other processed lunch meats. Salami. Fatback. Hotdogs. Bratwurst. Salted nuts and seeds. Canned beans with added salt. Canned or smoked fish. Whole eggs or egg yolks. Chicken or [place] with skin. Dairy Whole or 2% milk, cream, and half-and-half. Whole or full-fat cream cheese. Whole-fat or sweetened yogurt. Full-fat cheese. Nondairy creamers. Whipped toppings. Processed cheese and cheese spreads. Fats and oils Butter. Stick margarine. Lard. Shortening. Ghee. Bacon fat. Tropical oils, such as coconut, palm kernel, or palm oil. Seasoning and other foods  Salted popcorn and pretzels. Onion salt, garlic salt, seasoned salt, table salt, and sea salt. Worcestershire sauce. Tartar sauce. Barbecue sauce. Teriyaki sauce. Soy sauce, including reduced-sodium. Steak sauce. Canned and packaged gravies. Fish sauce. Oyster sauce. Cocktail sauce. Horseradish that you find on the shelf. Ketchup. Mustard. Meat flavorings and tenderizers. Bouillon cubes. Hot sauce and Tabasco sauce. Premade or packaged marinades. Premade or  packaged taco seasonings. Relishes. Regular salad dressings. Where to find more information:  National Heart, Lung, and [place]: https://wilson-eaton.com/  American Heart Association: www.heart.org Summary  The DASH eating plan is a healthy eating plan that has been shown to reduce high blood pressure (hypertension). It may also reduce your risk for type 2 diabetes, heart disease, and stroke.  With the DASH eating plan, you should limit salt (sodium) intake to 2,300 mg a day. If you have hypertension, you may need to reduce your sodium intake to 1,500 mg a day.  When on the DASH eating plan, aim to eat more fresh fruits and vegetables, whole grains, lean proteins, low-fat dairy, and heart-healthy fats.  Work with your health care provider or diet and nutrition specialist (dietitian) to adjust your eating plan to your individual calorie needs. This information is not intended to replace advice given to you by your health care provider. Make sure you discuss any questions you have with your health care provider. Document Released: 11/16/2011 Document Revised: 11/20/2016 Document Reviewed: 11/20/2016 Elsevier Interactive Patient Education  [employer].   If you need a refill on your cardiac medications before your next appointment, please call your pharmacy.

## 2018-08-16 NOTE — Progress Notes (Signed)
Cardiology Office Note:    Date:  08/16/2018   ID:  Elizabeth Melton, DOB 1941/11/01, MRN [id number]  PCP:  Leighton Ruff, MD  Cardiologist:   Ena Dawley, MD  - New today     Referring MD: Leighton Ruff, MD   Chief Complaint  Patient presents with  . Aortic Insuffiency    History of Present Illness:    Elizabeth Melton is a 77 y.o. female who is being seen today for the evaluation of valvular heart disease at the request of Leighton Ruff, MD.   The patient has a past medical history significant for HTN, HLD, DM type 2, hypothyroidism, current tobacco use, vitamin D deficiency. She has moderate aortic regurgitation by echo in 06/2016 with normal EF.   She is here today alone. She is not sure why she is here. She notes a cramping in her right lower scapular area that sometimes is very sharp. It occurs sometimes at night and at random times, not related to activity. Not every day, about 3-4 times per week. She sometimes drinks apple cider vinegar in water which helps or her husband messages it. She also gets leg cramps which have recently been better controlled with  [employer] topical foam.   She denies any chest pain/pressure/tightness. She has DOE with minimal exertion liking walking ~500 feet and especially with going up hill or up stairs. She has to stop for a break going up 1 flight of stairs. She has some limitation with activity by her knees. She denies palpitations, significant lightheadedness, syncope. She has occ mild ankle/pedal edema, none now.  She has burning in her feet like standing on hot coals with activity and standing like when doing the dishes. She also gets pain in bilateral thighs with walking.   She eats seafood twice a week but fries it. She eats a lot of snacks foods and not much fruits and vegetables. She has smoked 1 PPD since her early 14's. She is trying to quit and has cut back to 1/2 PPD. She is not aware of any significant family history  of CAD.   No flowsheet data found.   Past Medical History:  Diagnosis Date  . Diabetes mellitus without complication ([place])   . Hyperlipidemia   . Hypothyroidism   . Thrombocytopenia ([place])     Past Surgical History:  Procedure Laterality Date  . TUBAL LIGATION      Current Medications: Current Meds  Medication Sig  . acetaminophen (TYLENOL) 500 MG tablet Take 500 mg by mouth every 6 (six) hours as needed.  Marland Kitchen b complex vitamins tablet Take 1 tablet by mouth daily.  . Calcium Carb-Cholecalciferol (CALCIUM + D3) 600-200 MG-UNIT TABS Take by mouth.  Marland Kitchen glucose blood test strip 1 each by Other route as needed. Use as instructed  . levothyroxine (SYNTHROID, LEVOTHROID) 75 MCG tablet Take 75 mcg by mouth daily.  Marland Kitchen lovastatin (MEVACOR) 40 MG tablet Take 40 mg by mouth at bedtime.  Marland Kitchen SMART SENSE THIN LANCETS 26G MISC by Does not apply route.     Allergies:   Patient has no known allergies.   Social History   Socioeconomic History  . Marital status: Married    Spouse name: Not on file  . Number of children: Not on file  . Years of education: Not on file  . Highest education level: Not on file  Occupational History  . Not on file  Social Needs  . Financial resource strain: Not on file  .  Food insecurity:    Worry: Not on file    Inability: Not on file  . Transportation needs:    Medical: Not on file    Non-medical: Not on file  Tobacco Use  . Smoking status: Current Every Day Smoker    Packs/day: 0.75    Years: 60.00    Pack years: 45.00    Types: Cigarettes  . Smokeless tobacco: Former Systems developer    Quit date: 09/24/1962  . Tobacco comment: Planning to start patches 08/2018  Substance and Sexual Activity  . Alcohol use: Yes  . Drug use: No  . Sexual activity: Not on file  Lifestyle  . Physical activity:    Days per week: Not on file    Minutes per session: Not on file  . Stress: Not on file  Relationships  . Social connections:    Talks on phone: Not on file     Gets together: Not on file    Attends religious service: Not on file    Active member of club or organization: Not on file    Attends meetings of clubs or organizations: Not on file    Relationship status: Not on file  Other Topics Concern  . Not on file  Social History Narrative  . Not on file     Family History: The patient's family history includes Diabetes in her mother; Hyperlipidemia in her sister; Hypertension in her mother and sister; Kidney disease in her mother; Other in her father. ROS:   Please see the history of present illness.     All other systems reviewed and are negative.  EKGs/Labs/Other Studies Reviewed:    The following studies were reviewed today:  Echo 06/29/2016 Study Conclusions - Left ventricle: The cavity size was normal. Systolic function was   normal. The estimated ejection fraction was in the range of 60%   to 65%. Wall motion was normal; there were no regional wall   motion abnormalities. Features are consistent with a pseudonormal   left ventricular filling pattern, with concomitant abnormal   relaxation and increased filling pressure (grade 2 diastolic   dysfunction). Doppler parameters are consistent with high   ventricular filling pressure. - Aortic valve: There was moderate regurgitation. - Mitral valve: Calcified annulus. Mildly thickened, mildly   calcified leaflets . There was mild regurgitation. - Left atrium: The atrium was severely dilated. - Pulmonary arteries: PA peak pressure: 46 mm Hg (S).  Impressions: - The right ventricular systolic pressure was increased consistent   with moderate pulmonary hypertension.  EKG:  EKG is ordered today.  The ekg ordered today demonstrates NSR with flattened T waves inferiorly.   Recent Labs: No results found for requested labs within last 8760 hours.   Recent Lipid Panel No results found for: CHOL, TRIG, HDL, CHOLHDL, VLDL, LDLCALC, LDLDIRECT  Physical Exam:    VS:  BP (!) 158/74    Pulse 72   Ht _0  (1.676 m)   Wt 188 lb 6.4 oz (85.5 kg)   SpO2 91%   BMI 30.41 kg/m     Wt Readings from Last 3 Encounters:  08/16/18 188 lb 6.4 oz (85.5 kg)  09/24/12 160 lb (72.6 kg)     GEN:  Well nourished, well developed in no acute distress HEENT: Normal NECK: No JVD; No carotid bruits LYMPHATICS: No lymphadenopathy CARDIAC: RRR, no murmurs, rubs, gallops RESPIRATORY:  Clear to auscultation without rales, wheezing or rhonchi  ABDOMEN: Soft, non-tender, non-distended MUSCULOSKELETAL:  No edema; No deformity  SKIN: Warm and dry NEUROLOGIC:  Alert and oriented x 3 PSYCHIATRIC:  Normal affect   ASSESSMENT:    1. SOB (shortness of breath) on exertion   2. Essential (primary) hypertension   3. Hyperlipidemia, unspecified hyperlipidemia type   4. Aortic valve insufficiency, etiology of cardiac valve disease unspecified   5. Type 2 diabetes mellitus with complication, without long-term current use of insulin ([place])   6. Tobacco abuse   7. Pain of lower extremity, unspecified laterality    PLAN:    This patient's case was discussed in depth with Dr. Meda Coffee. The plan below was formulated per our discussion.  In order of problems listed above:  CV risk factors: diabetes, age, HTN, HLD, smoking. She is noted to have coronary calcium on LM, LAD and RCA on low dose chest CT done yesterday.   DOE: She has significant DOE on minimal exertion which may be attributed to long time smoking. She has some intermittent "cramping" in her right scapular region which could represent an anginal equivalent. Considering her risk factors and significant DOE, Will check lexiscan myoview. I discussed my suspicion for coronary artery disease and that if her test is abnormal we would arrange for cardiac cath to definitively evaluate, with possibel intervention. She is in agreement.   Hypertension: losartan 25 in the past, stopped as it made her feel bad.  Normal renal function by labs in 06/2018. I  advised her on good BP control with goal <130/80. We' ll see her back in a month after testing for further discussion of risk factor modification.   Hyperlipidemia: fish oil, lovastatin 40 mg daily. LDL was 95 in 05/2016. 98 06/20/2017. Good control. Would need to be lower if we discover CAD.   Aortic regurgitation: moderate by echo in 06/2016. No murmur on exam. Will update echo.   DM Type 2: pt reports blood sugars at home run <140. A1c 7.7 in July. Recommend goal <7.   Tobacco abuse: 1 PPD since her early 20's. Working on cessation. Down to 1/2 PPD. She has a plan to help her quit, weaning down with nicotine gum.   Leg pain with activity: Thigh pain with walking. Long time smoker at risk for PAD. Will check ABI's.   Hypothyroidism: s/p radioactive iodine in 1980's. On thyroid replacement. followed by PCP. TSH 1.74 06/21/2018  Medication Adjustments/Labs and Tests Ordered: Current medicines are reviewed at length with the patient today.  Concerns regarding medicines are outlined above. Labs and tests ordered and medication changes are outlined in the patient instructions below:  Patient Instructions  Medication Instructions: Your physician recommends that you continue on your current medications as directed. Please refer to the Current Medication list given to you today.   Labwork: None  Procedures/Testing: Your physician has requested that you have an echocardiogram. Echocardiography is a painless test that uses sound waves to create images of your heart. It provides your doctor with information about the size and shape of your heart and how well your heart's chambers and valves are working. This procedure takes approximately one hour. There are no restrictions for this procedure.   Your physician has requested that you have a lexiscan myoview. For further information please visit [email or web address]. Please follow instruction sheet, as given.   Your physician has requested that you  have an ankle brachial index (ABI). During this test an ultrasound and blood pressure cuff are used to evaluate the arteries that supply the arms and legs with blood. Allow thirty minutes for this  exam. There are no restrictions or special instructions.    Follow-Up: Your physician recommends that you schedule a follow-up appointment in: 1 month with Dr. Meda Coffee or Pecolia Ades NP   Any Additional Special Instructions Will Be Listed Below (If Applicable).    DASH Eating Plan DASH stands for "Dietary Approaches to Stop Hypertension." The DASH eating plan is a healthy eating plan that has been shown to reduce high blood pressure (hypertension). It may also reduce your risk for type 2 diabetes, heart disease, and stroke. The DASH eating plan may also help with weight loss. What are tips for following this plan? General guidelines  Avoid eating more than 2,300 mg (milligrams) of salt (sodium) a day. If you have hypertension, you may need to reduce your sodium intake to 1,500 mg a day.  Limit alcohol intake to no more than 1 drink a day for nonpregnant women and 2 drinks a day for men. One drink equals 12 oz of beer, 5 oz of wine, or 1 oz of hard liquor.  Work with your health care provider to maintain a healthy body weight or to lose weight. Ask what an ideal weight is for you.  Get at least 30 minutes of exercise that causes your heart to beat faster (aerobic exercise) most days of the week. Activities may include walking, swimming, or biking.  Work with your health care provider or diet and nutrition specialist (dietitian) to adjust your eating plan to your individual calorie needs. Reading food labels  Check food labels for the amount of sodium per serving. Choose foods with less than 5 percent of the Daily Value of sodium. Generally, foods with less than 300 mg of sodium per serving fit into this eating plan.  To find whole grains, look for the word "whole" as the first word in the  ingredient list. Shopping  Buy products labeled as "low-sodium" or "no salt added."  Buy fresh foods. Avoid canned foods and premade or frozen meals. Cooking  Avoid adding salt when cooking. Use salt-free seasonings or herbs instead of table salt or sea salt. Check with your health care provider or pharmacist before using salt substitutes.  Do not fry foods. Cook foods using healthy methods such as baking, boiling, grilling, and broiling instead.  Cook with heart-healthy oils, such as olive, canola, soybean, or sunflower oil. Meal planning   Eat a balanced diet that includes: ? 5 or more servings of fruits and vegetables each day. At each meal, try to fill half of your plate with fruits and vegetables. ? Up to 6-8 servings of whole grains each day. ? Less than 6 oz of lean meat, poultry, or fish each day. A 3-oz serving of meat is about the same size as a deck of cards. One egg equals 1 oz. ? 2 servings of low-fat dairy each day. ? A serving of nuts, seeds, or beans 5 times each week. ? Heart-healthy fats. Healthy fats called Omega-3 fatty acids are found in foods such as flaxseeds and coldwater fish, like sardines, salmon, and mackerel.  Limit how much you eat of the following: ? Canned or prepackaged foods. ? Food that is high in trans fat, such as fried foods. ? Food that is high in saturated fat, such as fatty meat. ? Sweets, desserts, sugary drinks, and other foods with added sugar. ? Full-fat dairy products.  Do not salt foods before eating.  Try to eat at least 2 vegetarian meals each week.  Eat more home-cooked food  and less restaurant, buffet, and fast food.  When eating at a restaurant, ask that your food be prepared with less salt or no salt, if possible. What foods are recommended? The items listed may not be a complete list. Talk with your dietitian about what dietary choices are best for you. Grains Whole-grain or whole-wheat bread. Whole-grain or whole-wheat  pasta. Brown rice. [place]. Bulgur. Whole-grain and low-sodium cereals. Pita bread. Low-fat, low-sodium crackers. Whole-wheat flour tortillas. Vegetables Fresh or frozen vegetables (raw, steamed, roasted, or grilled). Low-sodium or reduced-sodium tomato and vegetable juice. Low-sodium or reduced-sodium tomato sauce and tomato paste. Low-sodium or reduced-sodium canned vegetables. Fruits All fresh, dried, or frozen fruit. Canned fruit in natural juice (without added sugar). Meat and other protein foods Skinless chicken or [place]. Ground chicken or [place]. Pork with fat trimmed off. Fish and seafood. Egg whites. Dried beans, peas, or lentils. Unsalted nuts, nut butters, and seeds. Unsalted canned beans. Lean cuts of beef with fat trimmed off. Low-sodium, lean deli meat. Dairy Low-fat (1%) or fat-free (skim) milk. Fat-free, low-fat, or reduced-fat cheeses. Nonfat, low-sodium ricotta or cottage cheese. Low-fat or nonfat yogurt. Low-fat, low-sodium cheese. Fats and oils Soft margarine without trans fats. Vegetable oil. Low-fat, reduced-fat, or light mayonnaise and salad dressings (reduced-sodium). Canola, safflower, olive, soybean, and sunflower oils. Avocado. Seasoning and other foods Herbs. Spices. Seasoning mixes without salt. Unsalted popcorn and pretzels. Fat-free sweets. What foods are not recommended? The items listed may not be a complete list. Talk with your dietitian about what dietary choices are best for you. Grains Baked goods made with fat, such as croissants, muffins, or some breads. Dry pasta or rice meal packs. Vegetables Creamed or fried vegetables. Vegetables in a cheese sauce. Regular canned vegetables (not low-sodium or reduced-sodium). Regular canned tomato sauce and paste (not low-sodium or reduced-sodium). Regular tomato and vegetable juice (not low-sodium or reduced-sodium). Angie Fava. Olives. Fruits Canned fruit in a light or heavy syrup. Fried fruit. Fruit in cream or  butter sauce. Meat and other protein foods Fatty cuts of meat. Ribs. Fried meat. Berniece Salines. Sausage. Bologna and other processed lunch meats. Salami. Fatback. Hotdogs. Bratwurst. Salted nuts and seeds. Canned beans with added salt. Canned or smoked fish. Whole eggs or egg yolks. Chicken or [place] with skin. Dairy Whole or 2% milk, cream, and half-and-half. Whole or full-fat cream cheese. Whole-fat or sweetened yogurt. Full-fat cheese. Nondairy creamers. Whipped toppings. Processed cheese and cheese spreads. Fats and oils Butter. Stick margarine. Lard. Shortening. Ghee. Bacon fat. Tropical oils, such as coconut, palm kernel, or palm oil. Seasoning and other foods Salted popcorn and pretzels. Onion salt, garlic salt, seasoned salt, table salt, and sea salt. Worcestershire sauce. Tartar sauce. Barbecue sauce. Teriyaki sauce. Soy sauce, including reduced-sodium. Steak sauce. Canned and packaged gravies. Fish sauce. Oyster sauce. Cocktail sauce. Horseradish that you find on the shelf. Ketchup. Mustard. Meat flavorings and tenderizers. Bouillon cubes. Hot sauce and Tabasco sauce. Premade or packaged marinades. Premade or packaged taco seasonings. Relishes. Regular salad dressings. Where to find more information:  National Heart, Lung, and [place]: https://wilson-eaton.com/  American Heart Association: www.heart.org Summary  The DASH eating plan is a healthy eating plan that has been shown to reduce high blood pressure (hypertension). It may also reduce your risk for type 2 diabetes, heart disease, and stroke.  With the DASH eating plan, you should limit salt (sodium) intake to 2,300 mg a day. If you have hypertension, you may need to reduce your sodium intake to 1,500 mg a  day.  When on the DASH eating plan, aim to eat more fresh fruits and vegetables, whole grains, lean proteins, low-fat dairy, and heart-healthy fats.  Work with your health care provider or diet and nutrition specialist (dietitian) to  adjust your eating plan to your individual calorie needs. This information is not intended to replace advice given to you by your health care provider. Make sure you discuss any questions you have with your health care provider. Document Released: 11/16/2011 Document Revised: 11/20/2016 Document Reviewed: 11/20/2016 Elsevier Interactive Patient Education  [employer].   If you need a refill on your cardiac medications before your next appointment, please call your pharmacy.      Signed, Daune Perch, NP  08/16/2018 5:58 PM    [facility]

## 2018-08-19 ENCOUNTER — Telehealth: Payer: Self-pay | Admitting: Acute Care

## 2018-08-19 ENCOUNTER — Other Ambulatory Visit: Payer: Self-pay | Admitting: Acute Care

## 2018-08-19 ENCOUNTER — Encounter: Payer: Self-pay | Admitting: *Deleted

## 2018-08-19 DIAGNOSIS — J849 Interstitial pulmonary disease, unspecified: Secondary | ICD-10-CM

## 2018-08-19 DIAGNOSIS — F17210 Nicotine dependence, cigarettes, uncomplicated: Secondary | ICD-10-CM

## 2018-08-19 DIAGNOSIS — Z122 Encounter for screening for malignant neoplasm of respiratory organs: Secondary | ICD-10-CM

## 2018-08-19 NOTE — Telephone Encounter (Signed)
  Sarah: thanks  Raquel Sarna  Please get HRCT supine and prone + get ILD questionnaire done and full  PFT and Serum: ESR, ACE, ANA, DS-DNA, RF, anti-CCP, ssA, ssB, scl-70, ANCA screen, MPO, PR-3, Total CK,  RNP, Aldolase,  Hypersensitivity Pneumonitis Panel   And get her to ILD clinic please next 1 - 2 months. Please complete all of above first  Thanks    SIGNATURE    Dr. Brand Males, M.D., F.C.C.P,  Pulmonary and Critical Care Medicine Staff Physician, [place] Director - Interstitial Lung Disease  Program  [facility] [place], [place], [address]  Pager: [phone], If no answer or between  15:00h - 7:00h: call [phone] Telephone: [phone]  3:15 PM 08/19/2018

## 2018-08-19 NOTE — Addendum Note (Signed)
Addended by: Lorretta Harp on: 08/19/2018 05:31 PM   Modules accepted: Orders

## 2018-08-19 NOTE — Telephone Encounter (Signed)
Here is another patient with ILD changes noted on Lung Cancer Screening scan. She is having a full cards work up 9/18 and 9/20. I spoke with her about being seen by Pulmonary in October to evaluate the findings . She has a follow up 6 month scan for the nodule finding, but I thought you may want to assess her as a consult for possible ILD. She was in agreement with this plan. Thanks

## 2018-08-19 NOTE — Telephone Encounter (Signed)
Called and spoke with pt letting her know that based on the lung cancer screen ct, Judson Roch had spoken with MR and both agreed for her to come in for a consult with Dr. [name].  Pt expressed understanding. I have scheduled pt's consult with MR and the Full PFT and placed all the orders for the HRCT, labs, and PFT orders.  I am mailing the ILD questionaire packet and also a letter to pt with all the appointment inforamtion or her reminder.  Nothing further needed.

## 2018-08-20 ENCOUNTER — Other Ambulatory Visit: Payer: Self-pay | Admitting: Cardiology

## 2018-08-20 DIAGNOSIS — I739 Peripheral vascular disease, unspecified: Secondary | ICD-10-CM

## 2018-08-22 NOTE — Addendum Note (Signed)
Addended by: Rose Phi on: 08/22/2018 09:43 AM   Modules accepted: Orders

## 2018-08-26 ENCOUNTER — Telehealth (HOSPITAL_COMMUNITY): Payer: Self-pay | Admitting: *Deleted

## 2018-08-26 NOTE — Telephone Encounter (Signed)
Patient given detailed instructions per Myocardial Perfusion Study Information Sheet for the test on 08/28/18 at 1015. Patient notified to arrive 15 minutes early and that it is imperative to arrive on time for appointment to keep from having the test rescheduled.  If you need to cancel or reschedule your appointment, please call the office within 24 hours of your appointment. . Patient verbalized understanding.Melat Wrisley, Ranae Palms

## 2018-08-28 ENCOUNTER — Ambulatory Visit (INDEPENDENT_AMBULATORY_CARE_PROVIDER_SITE_OTHER)
Admission: RE | Admit: 2018-08-28 | Discharge: 2018-08-28 | Disposition: A | Discharge status: Home / Self Care | Payer: Medicare HMO | Source: Ambulatory Visit | Attending: Internal Medicine | Admitting: Internal Medicine

## 2018-08-28 ENCOUNTER — Ambulatory Visit (HOSPITAL_COMMUNITY): Payer: Medicare HMO | Attending: Cardiology

## 2018-08-28 ENCOUNTER — Other Ambulatory Visit: Payer: Self-pay

## 2018-08-28 ENCOUNTER — Ambulatory Visit (HOSPITAL_BASED_OUTPATIENT_CLINIC_OR_DEPARTMENT_OTHER): Payer: Medicare HMO

## 2018-08-28 VITALS — Ht 66.0 in | Wt 188.0 lb

## 2018-08-28 DIAGNOSIS — Z720 Tobacco use: Secondary | ICD-10-CM | POA: Diagnosis not present

## 2018-08-28 DIAGNOSIS — R918 Other nonspecific abnormal finding of lung field: Secondary | ICD-10-CM | POA: Diagnosis not present

## 2018-08-28 DIAGNOSIS — I083 Combined rheumatic disorders of mitral, aortic and tricuspid valves: Secondary | ICD-10-CM | POA: Diagnosis not present

## 2018-08-28 DIAGNOSIS — E119 Type 2 diabetes mellitus without complications: Secondary | ICD-10-CM | POA: Diagnosis not present

## 2018-08-28 DIAGNOSIS — E785 Hyperlipidemia, unspecified: Secondary | ICD-10-CM | POA: Diagnosis not present

## 2018-08-28 DIAGNOSIS — R0602 Shortness of breath: Secondary | ICD-10-CM | POA: Diagnosis not present

## 2018-08-28 DIAGNOSIS — I351 Nonrheumatic aortic (valve) insufficiency: Secondary | ICD-10-CM

## 2018-08-28 DIAGNOSIS — J849 Interstitial pulmonary disease, unspecified: Secondary | ICD-10-CM | POA: Diagnosis not present

## 2018-08-28 DIAGNOSIS — I119 Hypertensive heart disease without heart failure: Secondary | ICD-10-CM | POA: Insufficient documentation

## 2018-08-28 LAB — MYOCARDIAL PERFUSION IMAGING
CHL CUP NUCLEAR SDS: 1
CSEPPHR: 91 {beats}/min
LV dias vol: 76 mL (ref 46–106)
LV sys vol: 29 mL
NUC STRESS TID: 1.14
Rest HR: 66 {beats}/min
SRS: 0
SSS: 1

## 2018-08-28 MED ORDER — REGADENOSON 0.4 MG/5ML IV SOLN
0.4000 mg | Freq: Once | INTRAVENOUS | Status: AC
  Administered 2018-08-28: 0.4 mg via INTRAVENOUS

## 2018-08-28 MED ORDER — TECHNETIUM TC 99M TETROFOSMIN IV KIT
30.1000 | PACK | Freq: Once | INTRAVENOUS | Status: AC | PRN
  Administered 2018-08-28: 30.1 via INTRAVENOUS
  Filled 2018-08-28: qty 31

## 2018-08-28 MED ORDER — TECHNETIUM TC 99M TETROFOSMIN IV KIT
9.7000 | PACK | Freq: Once | INTRAVENOUS | Status: AC | PRN
  Administered 2018-08-28: 9.7 via INTRAVENOUS
  Filled 2018-08-28: qty 10

## 2018-08-29 ENCOUNTER — Telehealth: Payer: Self-pay

## 2018-08-29 NOTE — Telephone Encounter (Signed)
-----  Message from Daune Perch, NP sent at 08/28/2018  7:09 PM EDT ----- Stress test was normal. I am not sure why pt has shortness of breath with activity but I would strongly advise smoking cessation. Try to start some gentle exercise for a few minutes per day and work up to walking for 30 minutes daily.   Please send copy to Leighton Ruff, MD  Daune Perch, NP

## 2018-08-29 NOTE — Telephone Encounter (Signed)
Notes recorded by Frederik Schmidt, RN on 08/29/2018 at 8:23 AM EDT Informed patient of stress test results and recommendations. She verbalized understanding. ------

## 2018-08-30 ENCOUNTER — Ambulatory Visit (HOSPITAL_COMMUNITY)
Admission: RE | Admit: 2018-08-30 | Discharge: 2018-08-30 | Disposition: A | Discharge status: Home / Self Care | Payer: Medicare HMO | Source: Ambulatory Visit | Attending: Internal Medicine | Admitting: Internal Medicine

## 2018-08-30 ENCOUNTER — Other Ambulatory Visit: Payer: Self-pay | Admitting: Cardiology

## 2018-08-30 DIAGNOSIS — I739 Peripheral vascular disease, unspecified: Secondary | ICD-10-CM

## 2018-09-02 ENCOUNTER — Telehealth: Payer: Self-pay

## 2018-09-02 DIAGNOSIS — I739 Peripheral vascular disease, unspecified: Secondary | ICD-10-CM

## 2018-09-02 NOTE — Telephone Encounter (Signed)
Spoke with pt she is aware of results. Pt verbalized understanding. Referral put in for [facility]

## 2018-09-04 ENCOUNTER — Ambulatory Visit (INDEPENDENT_AMBULATORY_CARE_PROVIDER_SITE_OTHER): Payer: Medicare HMO | Admitting: Vascular Surgery

## 2018-09-04 ENCOUNTER — Encounter: Payer: Self-pay | Admitting: Vascular Surgery

## 2018-09-04 VITALS — BP 143/68 | HR 81 | Temp 97.7°F | Resp 18 | Ht 66.0 in | Wt 188.0 lb

## 2018-09-04 DIAGNOSIS — I739 Peripheral vascular disease, unspecified: Secondary | ICD-10-CM | POA: Diagnosis not present

## 2018-09-04 NOTE — Progress Notes (Signed)
REASON FOR CONSULT:    Peripheral vascular disease.  The consult is requested by Daune Perch.   HPI:   Elizabeth Melton is a pleasant 77 y.o. female, who is referred for evaluation of peripheral vascular disease.  The patient was previously seen in our office in October 2013 by Dr. Sherren Mocha Early with lower extremity complaints.  At that time, the patient had an ABI of 0.5 on the right and 0.78 on the left.  She had bilateral superficial femoral artery occlusive disease at that time.  She was to follow-up as needed.  On my history, she states that she has been having pain in both legs for several years.  She experiences pain in both calves which is brought on by ambulation and relieved with rest.  She also experiences some pain even with standing however.  In addition she has a left knee pain.  Otherwise her symptoms appear to be more significant on the right side.  She describes cramping in her calves when she sitting and at night but I do not get any clear-cut history of rest pain or history of nonhealing ulcers.  She also describes burning pain in both feet when she is standing and sometimes at night.  Her risk factors for peripheral vascular disease include diabetes, hypercholesterolemia, and tobacco use.  She had smoked 1 pack/days for many years and is cut back to half a pack per day.  She started smoking when she was 77 years old.  She denies any history of hypertension or family history of premature cardiovascular disease.  Past Medical History:  Diagnosis Date  . Diabetes mellitus without complication ([place])   . Hyperlipidemia   . Hypothyroidism   . Thrombocytopenia ([place])     Family History  Problem Relation Age of Onset  . Diabetes Mother   . Kidney disease Mother   . Hypertension Mother   . Other Father        tuberculosis  . Hypertension Sister   . Hyperlipidemia Sister     SOCIAL HISTORY: Social History   Socioeconomic History  . Marital status: Married   Spouse name: Not on file  . Number of children: Not on file  . Years of education: Not on file  . Highest education level: Not on file  Occupational History  . Not on file  Social Needs  . Financial resource strain: Not on file  . Food insecurity:    Worry: Not on file    Inability: Not on file  . Transportation needs:    Medical: Not on file    Non-medical: Not on file  Tobacco Use  . Smoking status: Current Every Day Smoker    Packs/day: 0.75    Years: 60.00    Pack years: 45.00    Types: Cigarettes  . Smokeless tobacco: Former Systems developer    Quit date: 09/24/1962  . Tobacco comment: Planning to start patches 08/2018  Substance and Sexual Activity  . Alcohol use: Yes  . Drug use: No  . Sexual activity: Not on file  Lifestyle  . Physical activity:    Days per week: Not on file    Minutes per session: Not on file  . Stress: Not on file  Relationships  . Social connections:    Talks on phone: Not on file    Gets together: Not on file    Attends religious service: Not on file    Active member of club or organization: Not on file  Attends meetings of clubs or organizations: Not on file    Relationship status: Not on file  . Intimate partner violence:    Fear of current or ex partner: Not on file    Emotionally abused: Not on file    Physically abused: Not on file    Forced sexual activity: Not on file  Other Topics Concern  . Not on file  Social History Narrative  . Not on file    No Known Allergies  Current Outpatient Medications  Medication Sig Dispense Refill  . acetaminophen (TYLENOL) 500 MG tablet Take 500 mg by mouth every 6 (six) hours as needed.    Marland Kitchen b complex vitamins tablet Take 1 tablet by mouth daily.    . Calcium Carb-Cholecalciferol (CALCIUM + D3) 600-200 MG-UNIT TABS Take by mouth.    Marland Kitchen glucose blood test strip 1 each by Other route as needed. Use as instructed    . levothyroxine (SYNTHROID, LEVOTHROID) 75 MCG tablet Take 75 mcg by mouth daily.    Marland Kitchen  lovastatin (MEVACOR) 40 MG tablet Take 40 mg by mouth at bedtime.    Marland Kitchen SMART SENSE THIN LANCETS 26G MISC by Does not apply route.    . Vitamin D, Ergocalciferol, (DRISDOL) 50000 units CAPS capsule Take 1 capsule by mouth once a week.     No current facility-administered medications for this visit.     REVIEW OF SYSTEMS:  _0  denotes positive finding, _1  denotes negative finding Cardiac  Comments:  Chest pain or chest pressure:    Shortness of breath upon exertion:    Short of breath when lying flat:    Irregular heart rhythm:        Vascular    Pain in calf, thigh, or hip brought on by ambulation: x   Pain in feet at night that wakes you up from your sleep:     Blood clot in your veins:    Leg swelling:         Pulmonary    Oxygen at home:    Productive cough:     Wheezing:         Neurologic    Sudden weakness in arms or legs:     Sudden numbness in arms or legs:     Sudden onset of difficulty speaking or slurred speech:    Temporary loss of vision in one eye:     Problems with dizziness:         Gastrointestinal    Blood in stool:     Vomited blood:         Genitourinary    Burning when urinating:     Blood in urine:        Psychiatric    Major depression:         Hematologic    Bleeding problems:    Problems with blood clotting too easily:        Skin    Rashes or ulcers:        Constitutional    Fever or chills:     PHYSICAL EXAM:   Vitals:   09/04/18 1249  BP: (!) 143/68  Pulse: 81  Resp: 18  Temp: 97.7 F (36.5 C)  SpO2: 93%  Weight: 188 lb (85.3 kg)  Height: 5' 6" (1.676 m)    GENERAL: The patient is a well-nourished female, in no acute distress. The vital signs are documented above. CARDIAC: There is a regular rate and rhythm.  VASCULAR: I do  not detect carotid bruits. She has palpable femoral pulses.  I cannot palpate popliteal or pedal pulses. On my exam she has a monophasic dorsalis pedis and posterior tibial signal on the right. On  the left side she has a biphasic posterior tibial signal with a monophasic dorsalis pedis signal. PULMONARY: There is good air exchange bilaterally without wheezing or rales. ABDOMEN: Soft and non-tender with normal pitched bowel sounds.  I do not palpate an abdominal aortic aneurysm. MUSCULOSKELETAL: There are no major deformities or cyanosis. NEUROLOGIC: No focal weakness or paresthesias are detected. SKIN: There are no ulcers or rashes noted. PSYCHIATRIC: The patient has a normal affect.  DATA:    DUPLEX: I did review the duplex scan of the lower extremities that was done on 08/30/2018.  On the right side there was some stenosis in the common femoral artery and also moderate to severe stenosis in the superficial femoral artery.  On the left side there was moderate to severe stenosis in the common femoral artery and also disease in the superficial femoral artery and popliteal artery.  ARTERIAL DOPPLER STUDY: I have also reviewed the arterial Doppler study that was done on 08/30/2018.  On the right side there is a monophasic anterior tibial signal and peroneal signal with a multiphasic posterior tibial signal.  ABI on the right is 98% and toe pressure is 55 mmHg.  On the left side there is a monophasic dorsalis pedis with a multiphasic posterior tibial and peroneal signal.  ABI is 67% on the left with a toe pressure of 63 mmHg.  ECHO: I reviewed the echo that was done on 08/28/2018.  Systolic function was normal.  Ejection fraction was estimated at 60 to 65%.   ASSESSMENT & PLAN:   PERIPHERAL VASCULAR DISEASE: This patient does have evidence of infrainguinal arterial occlusive disease bilaterally.  She has stable claudication with no rest pain or nonhealing ulcers.  We have discussed the importance of tobacco cessation.  I have explained that continued tobacco use will likely result in progression of her disease and this this could become a more serious problem.  In addition I encouraged her  to get on a structured walking program.  I explained that if her symptoms are disabling that we could consider arteriography in hopes of finding something that can be addressed from an endovascular standpoint.  However based on her previous duplex she had common femoral artery disease also bilaterally.  She is comfortable continuing with conservative treatment for now.  I have ordered follow-up ABIs in 9 months and I will see her back at that time.  She knows to call sooner if her symptoms progress.   Deitra Mayo [facility] [phone]

## 2018-09-18 ENCOUNTER — Ambulatory Visit: Payer: Medicare HMO | Admitting: Cardiology

## 2018-09-18 DIAGNOSIS — Z720 Tobacco use: Secondary | ICD-10-CM | POA: Insufficient documentation

## 2018-09-18 DIAGNOSIS — I739 Peripheral vascular disease, unspecified: Secondary | ICD-10-CM | POA: Insufficient documentation

## 2018-09-18 DIAGNOSIS — R0989 Other specified symptoms and signs involving the circulatory and respiratory systems: Secondary | ICD-10-CM

## 2018-09-18 DIAGNOSIS — I10 Essential (primary) hypertension: Secondary | ICD-10-CM | POA: Insufficient documentation

## 2018-09-18 DIAGNOSIS — I351 Nonrheumatic aortic (valve) insufficiency: Secondary | ICD-10-CM | POA: Insufficient documentation

## 2018-09-18 NOTE — Progress Notes (Deleted)
Cardiology Office Note:    Date:  09/18/2018   ID:  Elizabeth Melton, DOB 01/22/1941, MRN [id number]  PCP:  Leighton Ruff, MD  Cardiologist:  Ena Dawley, MD  Referring MD: Leighton Ruff, MD   No chief complaint on file. ***  History of Present Illness:    Elizabeth Melton is a 77 y.o. female with a past medical history significant for HTN, HLD, DM type 2, hypothyroidism, current tobacco use, vitamin D deficiency. She has moderate aortic regurgitation by echo in 06/2016 with normal EF.   I saw her in the office on 08/16/2018 at which time she had complaints of dyspnea on exertion.  She underwent a Lexiscan Myoview on 08/28/2018 which was normal with no evidence of ischemia and normal EF.  She also had echocardiogram that showed normal LV function with EF 60-65%, no regional wall motion abnormalities, mild-moderate AI, MR and moderate TR, relatively unchanged since 2017.   She also complained of leg pain.  ABIs were checked and were abnormal.  Lower extremity Dopplers were done on 08/30/2018 and revealed infra inguinal arterial occlusive disease bilaterally.  She was referred to vascular surgeon and was seen by Dr. Doren Custard on 09/04/2018 who outlined a program of conservative treatment for now.  She was advised on smoking cessation and a structured walking program.  He will follow-up on ABIs in 9 months.  She is here today  Past Medical History:  Diagnosis Date  . Diabetes mellitus without complication ([place])   . Hyperlipidemia   . Hypothyroidism   . Thrombocytopenia ([place])     Past Surgical History:  Procedure Laterality Date  . TUBAL LIGATION      Current Medications: No outpatient medications have been marked as taking for the 09/18/18 encounter (Appointment) with Daune Perch, NP.     Allergies:   Patient has no known allergies.   Social History   Socioeconomic History  . Marital status: Married    Spouse name: Not on file  . Number of children: Not on file    . Years of education: Not on file  . Highest education level: Not on file  Occupational History  . Not on file  Social Needs  . Financial resource strain: Not on file  . Food insecurity:    Worry: Not on file    Inability: Not on file  . Transportation needs:    Medical: Not on file    Non-medical: Not on file  Tobacco Use  . Smoking status: Current Every Day Smoker    Packs/day: 0.75    Years: 60.00    Pack years: 45.00    Types: Cigarettes  . Smokeless tobacco: Former Systems developer    Quit date: 09/24/1962  . Tobacco comment: Planning to start patches 08/2018  Substance and Sexual Activity  . Alcohol use: Yes  . Drug use: No  . Sexual activity: Not on file  Lifestyle  . Physical activity:    Days per week: Not on file    Minutes per session: Not on file  . Stress: Not on file  Relationships  . Social connections:    Talks on phone: Not on file    Gets together: Not on file    Attends religious service: Not on file    Active member of club or organization: Not on file    Attends meetings of clubs or organizations: Not on file    Relationship status: Not on file  Other Topics Concern  . Not on file  Social History Narrative  . Not on file     Family History: The patient's ***family history includes Diabetes in her mother; Hyperlipidemia in her sister; Hypertension in her mother and sister; Kidney disease in her mother; Other in her father. ROS:   Please see the history of present illness.    *** All other systems reviewed and are negative.  EKGs/Labs/Other Studies Reviewed:    The following studies were reviewed today:  Echocardiogram 08/28/2018 Study Conclusions - Left ventricle: The cavity size was normal. Wall thickness was   normal. Systolic function was normal. The estimated ejection   fraction was in the range of 60% to 65%. Wall motion was normal;   there were no regional wall motion abnormalities. Normal GLS at   -18%. Doppler parameters are consistent with  abnormal left   ventricular relaxation (grade 1 diastolic dysfunction). The E/e&'   ratio is >15, suggesting elevated LV filling pressure. - Aortic valve: Trileaflet. Sclerosis without stenosis. There was   mild to moderate regurgitation. - Mitral valve: Mildly thickened leaflets . There was mild to   moderate regurgitation. - Left atrium: Moderately dilated. - Tricuspid valve: There was moderate regurgitation. - Pulmonary arteries: PA peak pressure: 48 mm Hg (S). - Inferior vena cava: The vessel was normal in size. The   respirophasic diameter changes were in the normal range (>= 50%),   consistent with normal central venous pressure.  Impressions: - Compared to a prior study in 2017, there are few changes. There   is mild to moderate AI, MR and moderate TR with a stable RVSP of   48 mmHg.  Myocardial perfusion imaging 08/28/2018  Nuclear stress EF: 62%.  There was no ST segment deviation noted during stress.  The left ventricular ejection fraction is normal (55-65%).  The study is normal.   1. EF 62%, normal wall motion.  2. No evidence for ischemia or infarction on perfusion images.   Normal study.   Lower extremity Dopplers 08/30/2018 Final Interpretation: Right: 30-49% stenosis noted in the common femoral artery. 75-99% stenosis noted in the proximal SFA, distal to the ostium. 50-74% stenosis in the mid SFA. Three vessel run-off.  Left: 50-74% stenosis noted in the common femoral artery. 30-49% stenosis noted in the proximal SFA, distal to the ostium and mid SFA. 75-99% stenosis noted in the AK popliteal artery. Three vessel run-off.  ABIs 08/30/2018 Right: ABIs are unreliable suggesting medial calcifications. The right toe-brachial index is abnormal.  Left: Resting left ankle-brachial index indicates moderate left lower extremity arterial disease. The left toe-brachial index is abnormal.   EKG:  EKG is *** ordered today.  The ekg ordered today demonstrates  ***  Recent Labs: No results found for requested labs within last 8760 hours.   Recent Lipid Panel No results found for: CHOL, TRIG, HDL, CHOLHDL, VLDL, LDLCALC, LDLDIRECT  Physical Exam:    VS:  There were no vitals taken for this visit.    Wt Readings from Last 3 Encounters:  09/04/18 188 lb (85.3 kg)  08/28/18 188 lb (85.3 kg)  08/16/18 188 lb 6.4 oz (85.5 kg)     Physical Exam***   ASSESSMENT:    1. Hyperlipidemia, unspecified hyperlipidemia type   2. Essential (primary) hypertension   3. PAD (peripheral artery disease) ([place])   4. Diabetes mellitus without complication ([place])   5. Tobacco abuse   6. Aortic valve insufficiency, etiology of cardiac valve disease unspecified    PLAN:    In order of problems  listed above:  DOE: Patient had normal stress test and echocardiogram.  Hypertension:   Add amlodipine (losartan 25 in the past, stopped as it made her feel bad.  Normal renal function by labs in 06/2018. I advised her on good BP control with goal <130/80. We' ll see her back in a month after testing for further discussion of risk factor modification. )  Hyperlipidemia:  fish oil, lovastatin 40 mg daily. LDL was 95 in 05/2016. 98 06/20/2017. Good control.     Peripheral vascular disease: Evidence of infra inguinal arterial occlusive disease bilaterally.  Now being followed by vascular surgery.  On statin.  DM Type 2: pt reports blood sugars at home run <140. A1c 7.7 in July. Recommend goal <7.   Tobacco abuse: 1 PPD since her early 20's. Working on cessation. Down to 1/2 PPD. She has a plan to help her quit, weaning down with nicotine gum.   Aortic regurgitation: moderate by echo in 06/2016. No murmur on exam.  Echo on 08/30/2018 shows mild-moderate AI, little change since last echo.  Continue to follow with echo every 1-2 years  Medication Adjustments/Labs and Tests Ordered: Current medicines are reviewed at length with the patient today.  Concerns regarding  medicines are outlined above. Labs and tests ordered and medication changes are outlined in the patient instructions below:  There are no Patient Instructions on file for this visit.   Signed, Daune Perch, NP  09/18/2018 5:13 AM    [facility]

## 2018-09-20 ENCOUNTER — Encounter: Payer: Self-pay | Admitting: Cardiology

## 2018-09-24 ENCOUNTER — Institutional Professional Consult (permissible substitution): Payer: Medicare HMO | Admitting: Internal Medicine

## 2018-11-18 ENCOUNTER — Telehealth: Payer: Self-pay | Admitting: Internal Medicine

## 2018-11-18 NOTE — Telephone Encounter (Signed)
  Elizabeth Melton  was a referral from [place]. HRCT showed ILD in sept 2019. But she has not followed up with autoimmune panel or PFT and visit. Please see your sept 2019 phone note . Ensure patient comes to ILD clinic first avai  Thanks    SIGNATURE    Dr. Brand Males, M.D., F.C.C.P,  Pulmonary and Critical Care Medicine Staff Physician, [facility] Director - Interstitial Lung Disease  Program  [facility] [place] [facility] [place], [place], [address]  Pager: [phone], If no answer or between  15:00h - 7:00h: call [phone] Telephone: [phone]  4:42 PM 11/18/2018    IMPRESSION: 1. 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 2. Scattered solid pulmonary nodules, largest 7 mm in the basilar right upper lobe, all stable since recent screening chest CT study. Please refer to 08/15/2018 screening chest CT report for follow-up recommendations. 3. Mild mediastinal lymphadenopathy is stable since 08/15/2018 CT and nonspecific, and can be reassessed on the follow-up screening chest CT study. 4. Three-vessel coronary atherosclerosis.  Aortic Atherosclerosis (ICD10-I70.0) and Emphysema (ICD10-J43.9).   Electronically Signed   By: Ilona Sorrel M.D.   On: 08/28/2018 12:56

## 2018-11-19 NOTE — Telephone Encounter (Signed)
Pt was scheduled for a consult with MR 09/24/18 but she did not show up for the consult appt. Pt was to have a PFT at 1pm followed by the consult appt with MR 09/24/18 but the appts were cancelled. The reason stated why pt cancelled appts was due to pt not wishing to reschedule at this time. labwork was also ordered but pt did not get the labwork done either.  Routing to MR as an Pharmacist, hospital

## 2018-11-19 NOTE — Telephone Encounter (Signed)
Called and spoke with pt. Pt stated due to cost of doctor's appt bills and also the bills from tests that will need to be done, pt stated she cannot afford any of that at this time and as of right now, pt is not going to schedule an appt. I stated to pt if she decided that she wanted to schedule a consult with MR to call our office. Pt expressed understanding. Nothing further needed.

## 2018-11-19 NOTE — Telephone Encounter (Signed)
Please call her again and see if she wants to come. IF she says no, then document and we can close off

## 2019-02-26 ENCOUNTER — Other Ambulatory Visit: Payer: Self-pay | Admitting: Family Medicine

## 2019-02-26 DIAGNOSIS — R911 Solitary pulmonary nodule: Secondary | ICD-10-CM

## 2019-06-03 DIAGNOSIS — Z1231 Encounter for screening mammogram for malignant neoplasm of breast: Secondary | ICD-10-CM | POA: Diagnosis not present

## 2019-06-23 DIAGNOSIS — Z720 Tobacco use: Secondary | ICD-10-CM | POA: Diagnosis not present

## 2019-06-23 DIAGNOSIS — E039 Hypothyroidism, unspecified: Secondary | ICD-10-CM | POA: Diagnosis not present

## 2019-06-23 DIAGNOSIS — J449 Chronic obstructive pulmonary disease, unspecified: Secondary | ICD-10-CM | POA: Diagnosis not present

## 2019-06-23 DIAGNOSIS — I700 Atherosclerosis of aorta: Secondary | ICD-10-CM | POA: Diagnosis not present

## 2019-06-23 DIAGNOSIS — E785 Hyperlipidemia, unspecified: Secondary | ICD-10-CM | POA: Diagnosis not present

## 2019-06-23 DIAGNOSIS — D696 Thrombocytopenia, unspecified: Secondary | ICD-10-CM | POA: Diagnosis not present

## 2019-06-23 DIAGNOSIS — E559 Vitamin D deficiency, unspecified: Secondary | ICD-10-CM | POA: Diagnosis not present

## 2019-06-23 DIAGNOSIS — E119 Type 2 diabetes mellitus without complications: Secondary | ICD-10-CM | POA: Diagnosis not present

## 2019-06-23 DIAGNOSIS — M810 Age-related osteoporosis without current pathological fracture: Secondary | ICD-10-CM | POA: Diagnosis not present

## 2019-06-23 DIAGNOSIS — Z0389 Encounter for observation for other suspected diseases and conditions ruled out: Secondary | ICD-10-CM | POA: Diagnosis not present

## 2019-07-15 DIAGNOSIS — Z13820 Encounter for screening for osteoporosis: Secondary | ICD-10-CM | POA: Diagnosis not present

## 2019-07-15 DIAGNOSIS — R29890 Loss of height: Secondary | ICD-10-CM | POA: Diagnosis not present

## 2019-07-15 DIAGNOSIS — M8589 Other specified disorders of bone density and structure, multiple sites: Secondary | ICD-10-CM | POA: Diagnosis not present

## 2019-07-17 ENCOUNTER — Ambulatory Visit
Admission: RE | Admit: 2019-07-17 | Discharge: 2019-07-17 | Disposition: A | Discharge status: Home / Self Care | Payer: Medicare HMO | Source: Ambulatory Visit | Attending: Family Medicine | Admitting: Family Medicine

## 2019-07-17 DIAGNOSIS — R918 Other nonspecific abnormal finding of lung field: Secondary | ICD-10-CM | POA: Diagnosis not present

## 2019-07-17 DIAGNOSIS — R911 Solitary pulmonary nodule: Secondary | ICD-10-CM

## 2019-08-11 DIAGNOSIS — I10 Essential (primary) hypertension: Secondary | ICD-10-CM | POA: Diagnosis not present

## 2019-08-11 DIAGNOSIS — E119 Type 2 diabetes mellitus without complications: Secondary | ICD-10-CM | POA: Diagnosis not present

## 2019-08-11 DIAGNOSIS — E039 Hypothyroidism, unspecified: Secondary | ICD-10-CM | POA: Diagnosis not present

## 2019-08-11 DIAGNOSIS — E785 Hyperlipidemia, unspecified: Secondary | ICD-10-CM | POA: Diagnosis not present

## 2019-08-11 DIAGNOSIS — M810 Age-related osteoporosis without current pathological fracture: Secondary | ICD-10-CM | POA: Diagnosis not present

## 2019-08-11 DIAGNOSIS — J449 Chronic obstructive pulmonary disease, unspecified: Secondary | ICD-10-CM | POA: Diagnosis not present

## 2019-08-13 DIAGNOSIS — E119 Type 2 diabetes mellitus without complications: Secondary | ICD-10-CM | POA: Diagnosis not present

## 2019-08-13 DIAGNOSIS — I10 Essential (primary) hypertension: Secondary | ICD-10-CM | POA: Diagnosis not present

## 2019-08-13 DIAGNOSIS — M810 Age-related osteoporosis without current pathological fracture: Secondary | ICD-10-CM | POA: Diagnosis not present

## 2019-08-13 DIAGNOSIS — J449 Chronic obstructive pulmonary disease, unspecified: Secondary | ICD-10-CM | POA: Diagnosis not present

## 2019-08-13 DIAGNOSIS — E039 Hypothyroidism, unspecified: Secondary | ICD-10-CM | POA: Diagnosis not present

## 2019-08-13 DIAGNOSIS — E785 Hyperlipidemia, unspecified: Secondary | ICD-10-CM | POA: Diagnosis not present

## 2019-09-04 ENCOUNTER — Other Ambulatory Visit: Payer: Self-pay

## 2019-09-04 ENCOUNTER — Other Ambulatory Visit: Payer: Self-pay | Admitting: *Deleted

## 2019-09-04 ENCOUNTER — Ambulatory Visit: Payer: Medicare HMO | Admitting: Internal Medicine

## 2019-09-04 ENCOUNTER — Encounter: Payer: Self-pay | Admitting: Internal Medicine

## 2019-09-04 VITALS — BP 144/60 | HR 66 | Temp 97.6°F | Ht 66.0 in | Wt 186.6 lb

## 2019-09-04 DIAGNOSIS — J849 Interstitial pulmonary disease, unspecified: Secondary | ICD-10-CM | POA: Diagnosis not present

## 2019-09-04 DIAGNOSIS — Z122 Encounter for screening for malignant neoplasm of respiratory organs: Secondary | ICD-10-CM

## 2019-09-04 DIAGNOSIS — Z87891 Personal history of nicotine dependence: Secondary | ICD-10-CM

## 2019-09-04 DIAGNOSIS — F17210 Nicotine dependence, cigarettes, uncomplicated: Secondary | ICD-10-CM

## 2019-09-04 LAB — SEDIMENTATION RATE: Sed Rate: 40 mm/hr — ABNORMAL HIGH (ref 0–30)

## 2019-09-04 NOTE — Patient Instructions (Addendum)
Lung nodule - 97m Right Middle Lobe Sept 2019 -> Aug 2020 with increase  Plan  - do HRCT in around Thanksgiving 2020  Smoking  - please quit  - high dose flu shot 09/04/2019 if you have not had it  Interstitial Lung Disease    - I am concerned you  have Interstitial Lung Disease (ILD)  -  There are MANY varieties of this - To narrow down possibilities and assess severity please do the following tests  - do questionnaire 09/04/2019 itself  - do spirometry and dlco test (no BD response, no DLCO; choose a location depending on schedule and convenience)  - do walking test on room air in the office (not 6 min walk test)  - if walk test 09/04/2019 abnormal - do overnight pulse ox  -  Do Serum: ESR, ACE, ANA, DS-DNA, RF, ANCA profieanti-CCP,Total CK,  Aldolase, scl-70, ssA, ssB, , anti-JO-1,  & Hypersensitivity Pneumonitis Panel   Followup   - 2-4 weeks to discuss results and next step

## 2019-09-04 NOTE — Progress Notes (Signed)
OV 09/04/2019  Subjective:  Patient ID: Elizabeth Melton, female , DOB: Jan 08, 1941 , age 78 y.o. , MRN: [id number] , ADDRESS: [id number] [place] [address]   09/04/2019 -   Chief Complaint  Patient presents with  . Pulmonary Consult    Pt referred here by Dr. Drema Dallas for abnormal CT chest. Pt c/o DOE X years - states this is stable. Pt denies cough and CP/tightness and f/c/s.      HPI Elizabeth Melton 78 y.o. -presents to the interstitial lung disease clinic for evaluation of pulmonary fibrosis.  She was seen by the lung nodule program approximately a year ago when lung cancer screening CT chest showed interstitial lung disease changes.  The CT scan also showed a 7 mm right middle lobe nodule by the minor fissure.  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  [.  We then asked her to do interstitial lung disease questionnaire and serology and show for the clinic.  However she did not.  She is here today a year later.  In the interim she did have another CT scan of the chest.  The CT scan of the chest is not high-resolution CT chest.  I visualized the scan.  The 7 mm right middle lobe nodule has enlarged.  The ILD changes are still present.  She tells me that she skipped visit a year ago because of insurance issues.  [place] Integrated Comprehensive ILD Questionnaire  Symptoms: Reports insidious onset of shortness of breath for the last few to several years.  He does the same since onset.  She also reports its episodic dyspnea.  SYMPTOM SCALE - ILD 09/04/2019   O2 use ra  Shortness of Breath 0 -> 5 scale with 5 being worst (score 6 If unable to do)  At rest 0  Simple tasks - showers, clothes change, eating, shaving 3  Household (dishes, doing bed, laundry) 3  Shopping 3  Walking level at own pace 3  Walking  keeping up with others of same age 66  Walking up Stairs 4.5  Walking up Hill 4.5  Total (40 - 48) Dyspnea Score x  How bad is your cough? x  How bad is your fatigue x    Her activities are limited by joint pain.  She also reports a cough it is the same since it started.  It is mild in intensity.  Occasionally brings out clear white phlegm.  It does affect her voice and she does clear her throat and she does feel a tickle in her throat.  No hemoptysis.    Past Medical History : Positive for diabetes blood clots not otherwise specified but negative for asthma, COPD, heart failure, rheumatoid arthritis, scleroderma, lupus, polymyositis, Sjogren's, GERD or hiatal hernia, sleep apnea, pulmonary hypertension, stroke.  No seizures.  Denies any heart disease or pleurisy   ROS: Positive for fatigue for the last few years and arthralgia for the last few years.  Also dry mouth for the last few years.  Does admit to snoring for unclear amount of time.  Has nonspecific rash for the last few weeks.  Denies any dysphagia or acid reflux or weight loss or ulcers in the mouth or vagina.   FAMILY HISTORY of LUNG DISEASE: Denies any COPD or pulmonary fibrosis or hypersensitivity pneumonitis  EXPOSURE HISTORY: Continues to smoke cigarettes since age 14.  He currently smokes 10 cigarettes a day.  She smokes cigars in the past.  Never smoked.  No electronic cigarette use.  No marijuana use currently but did smoke marijuana 20 years ago.  No cocaine use.  No intravenous drug use.   HOME and HOBBY DETAILS : Lives in a single-family [place] home for the last 34 years.  Extensive organic antigen exposure in the house history is negative.  In particular noted to have environment.  No more than will be on the shower curtain.  No mold or mildew anywhere in the house.  Does use a humidifier but no more than it.  Does not use a steam iron.  Does not have a Jacuzzi of swimming pool.  No misting found inside the house.  No  pet birds or parakeets.  No pet gerbils.  Does not use feather pillows.  Does not play any wind instruments.  Does not do much gardening.   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  Has worked as a Theme park manager.  Has worked as a Insurance claims handler with cosmetics.  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  Details are not known.  She has worked opening In jars of relaxer chemicals and during this time was exposed with his gas and fumes.  Rest are negative.   PULMONARY TOXICITY HISTORY (27 items):  -Denies     Simple office walk 185 feet x  3 laps goal with forehead probe 09/04/2019   O2 used RA  Number laps completed 3 attempted but at stopped at 1 due to dyspnea an left knee pain  Comments about pace slow  Resting Pulse Ox/HR 99% and 66/min  Final Pulse Ox/HR 93% and 105/min  Desaturated </= 88% No but did not do all 3  Desaturated <= 3% points yes  Got Tachycardic >/= 90/min Yes, 6 points  Symptoms at end of test Dyspnea and left knee pain  Miscellaneous comments Stopped early due to knee pain and dyspnea         ROS - per HPI     has a past medical history of Diabetes mellitus without complication ([place]), Hyperlipidemia, Hypothyroidism, and Thrombocytopenia ([place]).   reports that she has been smoking cigarettes. She has a 45.00 pack-year smoking history. She quit smokeless tobacco use about 56 years ago.  Past Surgical History:  Procedure Laterality Date  . TUBAL LIGATION      No Known Allergies   There is no immunization history on file for this patient.  Family History  Problem Relation Age of Onset  . Diabetes Mother   . Kidney disease Mother   . Hypertension Mother   . Other Father        tuberculosis  . Hypertension Sister   . Hyperlipidemia Sister      Current Outpatient Medications:  .  acetaminophen (TYLENOL) 500 MG tablet, Take 500 mg by mouth every 6 (six) hours as needed., Disp: , Rfl:  .   b complex vitamins tablet, Take 1 tablet by mouth daily., Disp: , Rfl:  .  Calcium Carb-Cholecalciferol (CALCIUM + D3) 600-200 MG-UNIT TABS, Take by mouth., Disp: , Rfl:  .  glucose blood test strip, 1 each by Other route as needed. Use as instructed, Disp: , Rfl:  .  levothyroxine (SYNTHROID, LEVOTHROID) 75 MCG tablet, Take 75 mcg by mouth daily., Disp: , Rfl:  .  lovastatin (MEVACOR) 40  MG tablet, Take 40 mg by mouth at bedtime., Disp: , Rfl:  .  Omega-3 1000 MG CAPS, Take by mouth., Disp: , Rfl:  .  SMART SENSE THIN LANCETS 26G MISC, by Does not apply route., Disp: , Rfl:  .  Vitamin D, Ergocalciferol, (DRISDOL) 50000 units CAPS capsule, Take 1 capsule by mouth once a week., Disp: , Rfl:       Objective:   Vitals:   09/04/19 1341  BP: (!) 144/60  Pulse: 66  Temp: 97.6 F (36.4 C)  TempSrc: Skin  SpO2: 98%  Weight: 186 lb 9.6 oz (84.6 kg)  Height: _0  (1.676 m)    Estimated body mass index is 30.12 kg/m as calculated from the following:   Height as of this encounter: _1  (1.676 m).   Weight as of this encounter: 186 lb 9.6 oz (84.6 kg).  _2 @  [employer]   09/04/19 1341  Weight: 186 lb 9.6 oz (84.6 kg)     Physical Exam  General Appearance:    Alert, cooperative, no distress, appears stated age - yes , Deconditioned looking - no , OBESE  - yes, Sitting on Wheelchair -  no  Head:    Normocephalic, without obvious abnormality, atraumatic  Eyes:    PERRL, conjunctiva/corneas clear,  Ears:    Normal TM's and external ear canals, both ears  Nose:   Nares normal, septum midline, mucosa normal, no drainage    or sinus tenderness. OXYGEN ON  - no . Patient is @ RA  Throat:   Lips, mucosa, and tongue normal; teeth and gums normal. Cyanosis on lips - no  Neck:   Supple, symmetrical, trachea midline, no adenopathy;    thyroid:  no enlargement/tenderness/nodules; no carotid   bruit or JVD  Back:     Symmetric, no curvature, ROM normal, no CVA tenderness   Lungs:     Distress - no , Wheeze no, Barrell Chest - no, Purse lip breathing - no, Crackles - mild at base   Chest Wall:    No tenderness or deformity.    Heart:    Regular rate and rhythm, S1 and S2 normal, no rub   or gallop, Murmur - no  Breast Exam:    NOT DONE  Abdomen:     Soft, non-tender, bowel sounds active all four quadrants,    no masses, no organomegaly. Visceral obesity - yes  Genitalia:   NOT DONE  Rectal:   NOT DONE  Extremities:   Extremities - normal, Has Cane - no, Clubbing - MILD POSSIBLY YES, Edema - no  Pulses:   2+ and symmetric all extremities  Skin:   Stigmata of Connective Tissue Disease - no .STIGMATA of CONNECTIVE TISSUE DISEASE  - Distal digital fissuring (ie, "mechanic hands") - no - Distal digital tip ulceration - no -Inflammatory arthritis or polyarticular morning joint stiffness ?60 minutes - no - Palmar telangiectasia - no - Raynaud phenomenon - no - Unexplained digital edema - no - Unexplained fixed rash on the digital extensor surfaces (Gottron's sign) - no ... - Deformities of RA - no - Scleroderma  - no - Malar Rash -  no   Lymph nodes:   Cervical, supraclavicular, and axillary nodes normal  Psychiatric:  Neurologic:   Pleasant - yes, Anxious - no, Flat affect - no  CAm-ICU - neg, Alert and Oriented x 3 - yes, Moves all 4s - yes, Speech - normal, Cognition - intact  Assessment:       ICD-10-CM   1. Interstitial pulmonary disease (HCC)  J84.9 Pulmonary Function Test    Sedimentation rate    Angiotensin converting enzyme    ANA    Anti-DNA antibody, double-stranded    Rheumatoid factor    Cyclic citrul peptide antibody, IgG    ANCA Screen Reflex Titer    Mpo/pr-3 (anca) antibodies    CK total and CKMB (cardiac)not at [facility]    Aldolase    Anti-scleroderma antibody    Sjogrens syndrome-A extractable nuclear antibody    Sjogrens syndrome-B extractable nuclear antibody    RNP Antibodies    Jo-1 antibody-IgG     Hypersensitivity pnuemonitis profile   With a probable UIP pattern and in my opinion this could even be consistent with UIP and history being positive mainly for inorganic exposure and ongoing smoking 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    Plan:     Patient Instructions  Lung nodule - 1m Right Middle Lobe Sept 2019 -> Aug 2020 with increase  Plan  - do HRCT in around Thanksgiving 2020  Smoking  - please quit  - high dose flu shot 09/04/2019 if you have not had it  Interstitial Lung Disease    - I am concerned you  have Interstitial Lung Disease (ILD)  -  There are MANY varieties of this - To narrow down possibilities and assess severity please do the following tests  - do questionnaire 09/04/2019 itself  - do spirometry and dlco test (no BD response, no DLCO; choose a location depending on schedule and convenience)  - do walking test on room air in the office (not 6 min walk test)  - if walk test 09/04/2019 abnormal - do overnight pulse ox  -  Do Serum: ESR, ACE, ANA, DS-DNA, RF, anti-CCP,Total CK,  Aldolase, scl-70, ssA, ssB, , anti-JO-1,  & Hypersensitivity Pneumonitis Panel   Followup   - 2-4 weeks to discuss results and next step > 50% of this > 40 min visit spent in face to face counseling or/and coordination of care - by this undersigned MD - Dr MBrand Males This includes one or more of the following documented above: discussion of test results, diagnostic or treatment recommendations, prognosis, risks and benefits of management options, instructions, education, compliance or risk-factor reduction   SIGNATURE    Dr. MBrand Males M.D., F.C.C.P,  Pulmonary and Critical Care Medicine Staff Physician, CTedrowDirector - Interstitial Lung Disease  Program  [place] [id number]  Pager: [phone] If no answer or between  15:00h - 7:00h: call [phone] Telephone: [phone]  2:05 PM 09/04/2019

## 2019-09-05 LAB — RNP ANTIBODIES: ENA RNP Ab: <0.2 AI (ref 0.0–0.9)

## 2019-09-10 LAB — CK TOTAL AND CKMB (NOT AT ARMC)
CK, MB: 3.5 ng/mL (ref 0–5.0)
Relative Index: 1.8 (ref 0–4.0)
Total CK: 196 U/L — ABNORMAL HIGH (ref 29–143)

## 2019-09-10 LAB — JO-1 ANTIBODY-IGG: Jo-1 Autoabs: <1 AI

## 2019-09-10 LAB — ANTI-DNA ANTIBODY, DOUBLE-STRANDED: ds DNA Ab: 6 IU/mL — ABNORMAL HIGH

## 2019-09-10 LAB — HYPERSENSITIVITY PNUEMONITIS PROFILE
ASPERGILLUS FUMIGATUS: NEGATIVE
Faenia retivirgula: NEGATIVE
Pigeon Serum: NEGATIVE
S. VIRIDIS: NEGATIVE
T. CANDIDUS: NEGATIVE
T. VULGARIS: NEGATIVE

## 2019-09-10 LAB — ANTI-SCLERODERMA ANTIBODY: Scleroderma (Scl-70) (ENA) Antibody, IgG: <1 AI

## 2019-09-10 LAB — MPO/PR-3 (ANCA) ANTIBODIES
Myeloperoxidase Abs: <1 AI
Serine Protease 3: <1 AI

## 2019-09-10 LAB — CYCLIC CITRUL PEPTIDE ANTIBODY, IGG: Cyclic Citrullin Peptide Ab: <16 UNITS

## 2019-09-10 LAB — ANTI-NUCLEAR AB-TITER (ANA TITER): ANA Titer 1: 1:80 {titer} — ABNORMAL HIGH

## 2019-09-10 LAB — ANGIOTENSIN CONVERTING ENZYME: Angiotensin-Converting Enzyme: 49 U/L (ref 9–67)

## 2019-09-10 LAB — ANA: Anti Nuclear Antibody (ANA): POSITIVE — AB

## 2019-09-10 LAB — SJOGRENS SYNDROME-A EXTRACTABLE NUCLEAR ANTIBODY: SSA (Ro) (ENA) Antibody, IgG: <1 AI

## 2019-09-10 LAB — SJOGRENS SYNDROME-B EXTRACTABLE NUCLEAR ANTIBODY: SSB (La) (ENA) Antibody, IgG: <1 AI

## 2019-09-10 LAB — ALDOLASE: Aldolase: 5.2 U/L (ref <=8.1)

## 2019-09-10 LAB — ANCA SCREEN W REFLEX TITER: ANCA Screen: NEGATIVE

## 2019-09-10 LAB — RHEUMATOID FACTOR: Rheumatoid fact SerPl-aCnc: <14 IU/mL (ref <14)

## 2019-09-18 ENCOUNTER — Encounter: Payer: Self-pay | Admitting: Internal Medicine

## 2019-09-22 DIAGNOSIS — J849 Interstitial pulmonary disease, unspecified: Secondary | ICD-10-CM | POA: Diagnosis not present

## 2019-09-26 ENCOUNTER — Ambulatory Visit: Payer: Medicare HMO | Admitting: Internal Medicine

## 2019-09-26 ENCOUNTER — Telehealth: Payer: Self-pay | Admitting: Internal Medicine

## 2019-09-26 DIAGNOSIS — G4734 Idiopathic sleep related nonobstructive alveolar hypoventilation: Secondary | ICD-10-CM

## 2019-09-26 NOTE — Telephone Encounter (Signed)
Let Elizabeth Melton knw that ono 09/19/2019 pulse  </= 88% for 303 min. Plan start 2L [place] at night

## 2019-09-30 NOTE — Telephone Encounter (Signed)
Called and spoke to patient. Relayed results from Dr. [name]. Patient verbalized understanding. Oxygen order placed. Nothing further needed at this time.

## 2019-10-02 ENCOUNTER — Telehealth: Payer: Self-pay | Admitting: Internal Medicine

## 2019-10-02 NOTE — Telephone Encounter (Signed)
Spoke with the pt  She is asking how much o2 to use at night and how long she will have to use this  I advised her phone note 09/26/19 she is to use 2lpm with sleep  I advised she should use this until she can discuss with MR at the next visit and then he can let her know how long she may need this  She verbalized understanding  Nothing further needed

## 2019-10-06 DIAGNOSIS — J849 Interstitial pulmonary disease, unspecified: Secondary | ICD-10-CM | POA: Diagnosis not present

## 2019-10-06 DIAGNOSIS — G4736 Sleep related hypoventilation in conditions classified elsewhere: Secondary | ICD-10-CM | POA: Diagnosis not present

## 2019-10-20 ENCOUNTER — Other Ambulatory Visit: Payer: Self-pay

## 2019-10-20 ENCOUNTER — Ambulatory Visit (INDEPENDENT_AMBULATORY_CARE_PROVIDER_SITE_OTHER)
Admission: RE | Admit: 2019-10-20 | Discharge: 2019-10-20 | Disposition: A | Discharge status: Home / Self Care | Payer: Medicare HMO | Source: Ambulatory Visit | Attending: Internal Medicine | Admitting: Internal Medicine

## 2019-10-20 ENCOUNTER — Other Ambulatory Visit (HOSPITAL_COMMUNITY)
Admission: RE | Admit: 2019-10-20 | Discharge: 2019-10-20 | Disposition: A | Discharge status: Home / Self Care | Payer: Medicare HMO | Source: Ambulatory Visit | Attending: Adult Health | Admitting: Adult Health

## 2019-10-20 DIAGNOSIS — R0602 Shortness of breath: Secondary | ICD-10-CM | POA: Diagnosis not present

## 2019-10-20 DIAGNOSIS — Z01812 Encounter for preprocedural laboratory examination: Secondary | ICD-10-CM | POA: Insufficient documentation

## 2019-10-20 DIAGNOSIS — Z20828 Contact with and (suspected) exposure to other viral communicable diseases: Secondary | ICD-10-CM | POA: Insufficient documentation

## 2019-10-20 DIAGNOSIS — J849 Interstitial pulmonary disease, unspecified: Secondary | ICD-10-CM | POA: Diagnosis not present

## 2019-10-21 LAB — NOVEL CORONAVIRUS, NAA (HOSP ORDER, SEND-OUT TO REF LAB; TAT 18-24 HRS): SARS-CoV-2, NAA: NOT DETECTED

## 2019-10-23 ENCOUNTER — Other Ambulatory Visit: Payer: Self-pay

## 2019-10-23 ENCOUNTER — Encounter: Payer: Self-pay | Admitting: Adult Health

## 2019-10-23 ENCOUNTER — Ambulatory Visit (INDEPENDENT_AMBULATORY_CARE_PROVIDER_SITE_OTHER): Payer: Medicare HMO | Admitting: Internal Medicine

## 2019-10-23 ENCOUNTER — Ambulatory Visit: Payer: Medicare HMO | Admitting: Adult Health

## 2019-10-23 DIAGNOSIS — Z720 Tobacco use: Secondary | ICD-10-CM | POA: Diagnosis not present

## 2019-10-23 DIAGNOSIS — I700 Atherosclerosis of aorta: Secondary | ICD-10-CM | POA: Diagnosis not present

## 2019-10-23 DIAGNOSIS — J9611 Chronic respiratory failure with hypoxia: Secondary | ICD-10-CM | POA: Diagnosis not present

## 2019-10-23 DIAGNOSIS — J849 Interstitial pulmonary disease, unspecified: Secondary | ICD-10-CM | POA: Diagnosis not present

## 2019-10-23 LAB — PULMONARY FUNCTION TEST
DL/VA % pred: 59 %
DL/VA: 2.42 ml/min/mmHg/L
DLCO unc % pred: 46 %
DLCO unc: 8.62 ml/min/mmHg
FEF 25-75 Pre: 1.14 L/sec
FEF2575-%Pred-Pre: 81 %
FEV1-%Pred-Pre: 95 %
FEV1-Pre: 1.53 L
FEV1FVC-%Pred-Pre: 96 %
FEV6-%Pred-Pre: 103 %
FEV6-Pre: 2.06 L
FEV6FVC-%Pred-Pre: 104 %
FVC-%Pred-Pre: 100 %
FVC-Pre: 2.1 L
Pre FEV1/FVC ratio: 73 %
Pre FEV6/FVC Ratio: 100 %

## 2019-10-23 NOTE — Progress Notes (Signed)
Spirometry and Dlco done today. 

## 2019-10-23 NOTE — Progress Notes (Signed)
_0  ID: Lelon Perla, female    DOB: 1941-08-27, 78 y.o.   MRN: [id number]  Chief Complaint  Patient presents with   Follow-up    ILD     Referring provider: Leighton Ruff, MD  HPI: 78 year old female active smoker seen for pulmonary consult September 04, 2019 for possible pulmonary fibrosis.  Patient underwent lung cancer screening CT chest 2019 that showed interstitial lung changes.  And pulmonary nodule  TEST/EVENTS :  Lung cancer screening CT chest September 2019 showed mild emphysema, 6.4 mm right upper lobe nodule, interstitial lung changes  High-resolution CT chest August 28, 2018-scattered pulmonary nodules largest 7 mm stable since 2019.  Extensive patchy confluent subpleural reticulation and groundglass attenuation, mild traction bronchiectasis and scattered areas of probable early honeycombing. 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  She was seen for pulmonary consult last visit for possible pulmonary fibrosis noted on screening CT chest.  Patient is currently an active smoker.  Pulmonary function testing done today shows no airflow obstruction or restriction.  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  Moderate emphysema.  Dilation of pulmonary trunk concerning for pulmonary artery hypertension.  And aortic  atherosclerosis. Overnight oximetry test 09/19/19/2020 showed nocturnal desaturations.  Patient was started on oxygen 2 L at nighttime.   Feels that since starting oxygen she is feeling better especially in the morning hours. She says she does get short of breath with activity.  But remains active and is able to do all of her housework and cooking without any difficulty.  Says she does occasionally have to stop and rest.  She does have some cough that is minimally productive. We discussed her test results.  And recent CT chest findings. She does not want to begin meds at this time, did discuss that she would discuss later dater if necessay but does not want to .   We discussed smoking cessation.  Declines pharm referral for smoking cessation   No Known Allergies   There is no immunization history on file for this patient.  Past Medical History:  Diagnosis Date   Diabetes mellitus without complication ([place]    Hyperlipidemia    Hypothyroidism    Thrombocytopenia (HCC)     Tobacco History: Social History   Tobacco Use  Smoking Status Current Every Day Smoker   Packs/day: 0.75   Years: 60.00   Pack years: 45.00   Types: Cigarettes  Smokeless Tobacco Former [employer] developer  Quit date: 09/24/1962  Tobacco Comment   Planning to start patches 08/2018   Ready to quit: No Counseling given: Yes Comment: Planning to start patches 08/2018   Outpatient Medications Prior to Visit  Medication Sig Dispense Refill   acetaminophen (TYLENOL) 500 MG tablet Take 500 mg by mouth every 6 (six) hours as needed.     b complex vitamins tablet Take 1 tablet by mouth daily.     Calcium Carb-Cholecalciferol (CALCIUM +  D3) 600-200 MG-UNIT TABS Take 2 tablets by mouth daily.      cholecalciferol (VITAMIN D3) 25 MCG (1000 UT) tablet Take 1,000 Units by mouth daily.     glucose blood test strip 1 each by Other route as needed. Use as instructed     levothyroxine (SYNTHROID, LEVOTHROID) 75 MCG tablet  Take 75 mcg by mouth daily.     lovastatin (MEVACOR) 40 MG tablet Take 40 mg by mouth at bedtime.     Omega-3 1000 MG CAPS Take by mouth.     SMART SENSE THIN LANCETS 26G MISC by Does not apply route.     Vitamin D, Ergocalciferol, (DRISDOL) 50000 units CAPS capsule Take 1 capsule by mouth once a week.     No facility-administered medications prior to visit.      Review of Systems:   Constitutional:   No  weight loss, night sweats,  Fevers, chills,  +fatigue, or  lassitude.  HEENT:   No headaches,  Difficulty swallowing,  Tooth/dental problems, or  Sore throat,                No sneezing, itching, ear ache, nasal congestion, post nasal drip,   CV:  No chest pain,  Orthopnea, PND, swelling in lower extremities, anasarca, dizziness, palpitations, syncope.   GI  No heartburn, indigestion, abdominal pain, nausea, vomiting, diarrhea, change in bowel habits, loss of appetite, bloody stools.   Resp: .  No wheezing.  No chest wall deformity  Skin: no rash or lesions.  GU: no dysuria, change in color of urine, no urgency or frequency.  No flank pain, no hematuria   MS:  No joint pain or swelling.  No decreased range of motion.  No back pain.    Physical Exam  BP 140/68 (BP Location: Left Arm, Cuff Size: Normal)    Pulse 76    Temp (!) 96.3 F (35.7 C) (Temporal)    Ht 5' 3.5" (1.613 m)    Wt 184 lb (83.5 kg)    SpO2 92%    BMI 32.08 kg/m   GEN: A/Ox3; pleasant , NAD elderly   HEENT:  [place]/AT, NOSE-clear, THROAT-clear, no lesions, no postnasal drip or exudate noted.   NECK:  Supple w/ fair ROM; no JVD; normal carotid impulses w/o bruits; no thyromegaly or nodules palpated; no lymphadenopathy.    RESP bibasilar crackles faint . no accessory muscle use, no dullness to percussion  CARD:  RRR, no m/r/g, no peripheral edema, pulses intact, no cyanosis or clubbing.  GI:   Soft & nt; nml bowel sounds; no organomegaly or masses detected.   Musco: Warm bil, no deformities or joint  swelling noted.   Neuro: alert, no focal deficits noted.    Skin: Warm, no lesions or rashes    Lab Results:   BMET No results found for: NA, K, CL, CO2, GLUCOSE, BUN, CREATININE, CALCIUM, GFRNONAA, GFRAA  BNP No results found for: BNP  ProBNP No results found for: PROBNP  Imaging: Ct Chest High Resolution  Result Date: 10/20/2019 CLINICAL DATA:  78 year old female with history of increasing shortness of breath with exertion for the past 3 weeks. EXAM: CT CHEST WITHOUT CONTRAST TECHNIQUE: Multidetector CT imaging of the chest was performed following the standard protocol without intravenous contrast. High resolution imaging of the lungs, as well as inspiratory and expiratory imaging, was performed. COMPARISON:  Chest CT 07/17/2019. FINDINGS: Cardiovascular: Heart size is normal. There is no significant pericardial fluid, thickening or pericardial calcification. There is  aortic atherosclerosis, as well as atherosclerosis of the great vessels of the mediastinum and the coronary arteries, including calcified atherosclerotic plaque in the left main, left anterior descending, left circumflex and right coronary arteries. Calcifications of the aortic valve. Dilatation of the pulmonic trunk (3.6 cm in diameter). Mediastinum/Nodes: No pathologically enlarged mediastinal or hilar lymph nodes. Esophagus is unremarkable in appearance. No axillary lymphadenopathy. Lungs/Pleura: High-resolution images demonstrate widespread areas of septal thickening, subpleural reticulation, traction bronchiectasis and areas of probable early honeycombing, similar to the prior study. No definitive craniocaudal gradient. There is also diffuse bronchial wall thickening with moderate centrilobular and paraseptal emphysema. Inspiratory and expiratory imaging demonstrates mild air trapping indicative of small airways disease. No acute consolidative airspace disease. No pleural effusions. Small pulmonary nodule in the inferior  aspect of the right upper lobe abutting the minor fissure (axial image 75 of series 3) measuring 7 x 6 mm, stable. Several other smaller pulmonary nodules have either resolved or stable compared to the prior examinations as well. No other definite suspicious appearing pulmonary nodules or masses are noted. Upper Abdomen: Aortic atherosclerosis. Musculoskeletal: There are no aggressive appearing lytic or blastic lesions noted in the visualized portions of the skeleton. IMPRESSION: 1. There is a spectrum of findings in the lungs considered probable usual interstitial pneumonia (UIP) per current ATS guidelines, stable compared to the prior examination. 2. Diffuse bronchial wall thickening with moderate centrilobular and paraseptal emphysema; imaging findings suggestive of underlying COPD. 3. Dilatation of the pulmonic trunk (3.6 cm in diameter), concerning for pulmonary arterial hypertension. 4. Aortic atherosclerosis, in addition to left main and 3 vessel coronary artery disease. Assessment for potential risk factor modification, dietary therapy or pharmacologic therapy may be warranted, if clinically indicated. 5. There are calcifications of the aortic valve. Echocardiographic correlation for evaluation of potential valvular dysfunction may be warranted if clinically indicated. Aortic Atherosclerosis (ICD10-I70.0) and Emphysema (ICD10-J43.9). Electronically Signed   By: Vinnie Langton M.D.   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  Work-up for autoimmune and connective tissue diseases essentially unrevealing except for a weakly positive ANA , .  Double-stranded  DNA antibody was indeterminate at level at 6.  ESR was elevated at 40 O2 saturations are adequate on room air.  Nocturnal hypoxemia noted on overnight oximetry test now on nocturnal oxygen.  Patient is active with no significant change in her level of activity due to breathing at this time.  We discussed her CT findings and went over interstitial lung disease reviewing her scans.  Pulmonary function testing shows normal lung function with no airflow obstruction or restriction however she has a significant severe diffusing defect.  We went over possible treatment options including antifibrotic's with Ofev and Esbriet.  She declines medications at this time.  After discussion she says she would discuss this again in the future but at this time does not want to begin on medications.  She does not review her breathing as having significant issues.  We discussed smoking cessation in detail.  Also could consider pulmonary rehab referral going forward.  Plan  Patient Instructions  Work on not smoking .  Activity as tolerated.  Continue on Oxygen 2l/m At bedtime   Follow up with Dr. [name] in 3 months in ILD clinic  Please contact office for sooner follow up if symptoms do not improve or worsen or seek emergency care         Chronic respiratory failure with hypoxia ([place]) Continue oxygen at bedtime 2 L  Tobacco abuse Smoking cessation discussed     Rexene Edison, NP 10/23/2019

## 2019-10-23 NOTE — Assessment & Plan Note (Signed)
Smoking cessation discussed

## 2019-10-23 NOTE — Assessment & Plan Note (Signed)
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  Work-up for autoimmune and connective tissue diseases essentially unrevealing except for a weakly positive ANA , .  Double-stranded DNA antibody was indeterminate at level at 6.  ESR was elevated at 40 O2 saturations are adequate on room air.  Nocturnal hypoxemia noted on overnight oximetry test now on nocturnal oxygen.  Patient is active with no significant change in her level of activity due to breathing at this time.  We discussed her CT findings and went over interstitial lung disease reviewing her scans.  Pulmonary function testing shows normal lung function with no airflow obstruction or restriction however she has a significant severe diffusing defect.  We went over possible treatment options including antifibrotic's with Ofev and Esbriet.  She declines medications at this time.  After discussion she says she would discuss this again in the future but at this time does not want to begin on medications.  She does not review her breathing as having significant issues.  We discussed smoking cessation in detail.  Also could consider pulmonary rehab referral going forward.  Plan  Patient Instructions  Work on not smoking .  Activity as tolerated.  Continue on Oxygen 2l/m At bedtime   Follow up with Dr. [name] in 3 months in ILD clinic  Please contact office for sooner follow up if symptoms do not improve or worsen or seek emergency care

## 2019-10-23 NOTE — Assessment & Plan Note (Signed)
Continue oxygen at bedtime 2 L

## 2019-10-23 NOTE — Patient Instructions (Addendum)
Work on not smoking .  Activity as tolerated.  Continue on Oxygen 2l/m At bedtime   Follow up with Dr. [name] in 3 months in ILD clinic  Please contact office for sooner follow up if symptoms do not improve or worsen or seek emergency care

## 2019-10-28 DIAGNOSIS — E785 Hyperlipidemia, unspecified: Secondary | ICD-10-CM | POA: Diagnosis not present

## 2019-10-28 DIAGNOSIS — J449 Chronic obstructive pulmonary disease, unspecified: Secondary | ICD-10-CM | POA: Diagnosis not present

## 2019-10-28 DIAGNOSIS — I10 Essential (primary) hypertension: Secondary | ICD-10-CM | POA: Diagnosis not present

## 2019-10-28 DIAGNOSIS — E039 Hypothyroidism, unspecified: Secondary | ICD-10-CM | POA: Diagnosis not present

## 2019-10-28 DIAGNOSIS — M810 Age-related osteoporosis without current pathological fracture: Secondary | ICD-10-CM | POA: Diagnosis not present

## 2019-10-28 DIAGNOSIS — E119 Type 2 diabetes mellitus without complications: Secondary | ICD-10-CM | POA: Diagnosis not present

## 2019-11-06 DIAGNOSIS — G4736 Sleep related hypoventilation in conditions classified elsewhere: Secondary | ICD-10-CM | POA: Diagnosis not present

## 2019-11-06 DIAGNOSIS — J849 Interstitial pulmonary disease, unspecified: Secondary | ICD-10-CM | POA: Diagnosis not present

## 2019-11-25 NOTE — Progress Notes (Signed)
Elizabeth Melton,   She has ILD and has seen TP the APP. TP has recommended followup in 3 months. Plese ensure followup in Feb-April (March prob best) with spiro/dlco. She has declined anti fibrotic.  ILD clinic in 30 months  Thanks  MR

## 2019-11-27 ENCOUNTER — Telehealth: Payer: Self-pay | Admitting: Internal Medicine

## 2019-11-27 NOTE — Telephone Encounter (Signed)
Spoke with pt, had questions regarding her O2 tubing.  All questions were answered to the best of my ability.  Nothing further needed at this time- will close encounter.

## 2019-12-06 DIAGNOSIS — J849 Interstitial pulmonary disease, unspecified: Secondary | ICD-10-CM | POA: Diagnosis not present

## 2019-12-06 DIAGNOSIS — G4736 Sleep related hypoventilation in conditions classified elsewhere: Secondary | ICD-10-CM | POA: Diagnosis not present

## 2019-12-08 DIAGNOSIS — R197 Diarrhea, unspecified: Secondary | ICD-10-CM | POA: Diagnosis not present

## 2019-12-08 DIAGNOSIS — R6883 Chills (without fever): Secondary | ICD-10-CM | POA: Diagnosis not present

## 2019-12-08 DIAGNOSIS — R110 Nausea: Secondary | ICD-10-CM | POA: Diagnosis not present

## 2019-12-08 DIAGNOSIS — R5383 Other fatigue: Secondary | ICD-10-CM | POA: Diagnosis not present

## 2019-12-09 DIAGNOSIS — R5383 Other fatigue: Secondary | ICD-10-CM | POA: Diagnosis not present

## 2019-12-09 DIAGNOSIS — R6883 Chills (without fever): Secondary | ICD-10-CM | POA: Diagnosis not present

## 2019-12-09 DIAGNOSIS — R197 Diarrhea, unspecified: Secondary | ICD-10-CM | POA: Diagnosis not present

## 2019-12-09 DIAGNOSIS — R110 Nausea: Secondary | ICD-10-CM | POA: Diagnosis not present

## 2020-01-05 ENCOUNTER — Telehealth: Payer: Self-pay | Admitting: Internal Medicine

## 2020-01-05 DIAGNOSIS — J849 Interstitial pulmonary disease, unspecified: Secondary | ICD-10-CM

## 2020-01-05 NOTE — Telephone Encounter (Signed)
Spoke with pt, requesting an order placed to [employer] to pick up and d/c all supplemental O2.  Pt states she is not wearing it at rest or with exertion and feels fine.    MR please advise if ok to d/c.  Thanks!

## 2020-01-06 DIAGNOSIS — J849 Interstitial pulmonary disease, unspecified: Secondary | ICD-10-CM | POA: Diagnosis not present

## 2020-01-06 DIAGNOSIS — G4736 Sleep related hypoventilation in conditions classified elsewhere: Secondary | ICD-10-CM | POA: Diagnosis not present

## 2020-01-08 NOTE — Telephone Encounter (Signed)
Ok that is her wish - that is fine then  Plan  - ensure followup per precent ov

## 2020-01-09 NOTE — Telephone Encounter (Signed)
Spoke with the pt and notified order sent She verbalized understanding  Nothing further needed

## 2020-01-09 NOTE — Telephone Encounter (Signed)
Pt called back.  Let her know order was placed.  Pt requesting call back.  [phone].

## 2020-01-09 NOTE — Telephone Encounter (Signed)
Order placed. I called pt to let her know but there was no answer. I LM to have her call back. She has an appt with MR on 01/29/2020.

## 2020-01-14 ENCOUNTER — Telehealth: Payer: Self-pay | Admitting: Internal Medicine

## 2020-01-14 DIAGNOSIS — E119 Type 2 diabetes mellitus without complications: Secondary | ICD-10-CM | POA: Diagnosis not present

## 2020-01-14 DIAGNOSIS — J849 Interstitial pulmonary disease, unspecified: Secondary | ICD-10-CM

## 2020-01-14 DIAGNOSIS — J449 Chronic obstructive pulmonary disease, unspecified: Secondary | ICD-10-CM | POA: Diagnosis not present

## 2020-01-14 DIAGNOSIS — E039 Hypothyroidism, unspecified: Secondary | ICD-10-CM | POA: Diagnosis not present

## 2020-01-14 DIAGNOSIS — M810 Age-related osteoporosis without current pathological fracture: Secondary | ICD-10-CM | POA: Diagnosis not present

## 2020-01-14 DIAGNOSIS — I10 Essential (primary) hypertension: Secondary | ICD-10-CM | POA: Diagnosis not present

## 2020-01-14 DIAGNOSIS — E785 Hyperlipidemia, unspecified: Secondary | ICD-10-CM | POA: Diagnosis not present

## 2020-01-14 DIAGNOSIS — J9611 Chronic respiratory failure with hypoxia: Secondary | ICD-10-CM

## 2020-01-14 NOTE — Telephone Encounter (Signed)
pt calling to inform Dr. [name] that the oxygen company has not came to pick up the oxygen supplies. They stated they have not received the faxed order. can be faxed to [employer] [phone]

## 2020-01-14 NOTE — Telephone Encounter (Signed)
Order placed for pt to have her O2 picked up by [employer]. Called and spoke with pt letting her know this had been done and pt verbalized understanding. Nothing further needed.

## 2020-01-29 ENCOUNTER — Ambulatory Visit: Payer: Medicare HMO | Admitting: Internal Medicine

## 2020-02-05 NOTE — Progress Notes (Signed)
Ild - prob uip. No change in seveity since aug 2020. Also emphysema and enlarged PA and co at calcification. Will discuss all this at fu

## 2020-02-12 DIAGNOSIS — J449 Chronic obstructive pulmonary disease, unspecified: Secondary | ICD-10-CM | POA: Diagnosis not present

## 2020-02-12 DIAGNOSIS — E119 Type 2 diabetes mellitus without complications: Secondary | ICD-10-CM | POA: Diagnosis not present

## 2020-02-12 DIAGNOSIS — I10 Essential (primary) hypertension: Secondary | ICD-10-CM | POA: Diagnosis not present

## 2020-02-12 DIAGNOSIS — E785 Hyperlipidemia, unspecified: Secondary | ICD-10-CM | POA: Diagnosis not present

## 2020-02-12 DIAGNOSIS — M810 Age-related osteoporosis without current pathological fracture: Secondary | ICD-10-CM | POA: Diagnosis not present

## 2020-02-12 DIAGNOSIS — E039 Hypothyroidism, unspecified: Secondary | ICD-10-CM | POA: Diagnosis not present

## 2020-02-23 ENCOUNTER — Ambulatory Visit: Payer: Medicare HMO | Admitting: Internal Medicine

## 2020-02-25 ENCOUNTER — Ambulatory Visit: Payer: Medicare HMO | Admitting: Internal Medicine

## 2020-02-25 ENCOUNTER — Other Ambulatory Visit: Payer: Self-pay

## 2020-02-25 ENCOUNTER — Encounter: Payer: Self-pay | Admitting: Internal Medicine

## 2020-02-25 VITALS — BP 162/72 | HR 82 | Temp 97.3°F | Ht 66.0 in | Wt 180.8 lb

## 2020-02-25 DIAGNOSIS — Z720 Tobacco use: Secondary | ICD-10-CM | POA: Diagnosis not present

## 2020-02-25 DIAGNOSIS — G4734 Idiopathic sleep related nonobstructive alveolar hypoventilation: Secondary | ICD-10-CM

## 2020-02-25 DIAGNOSIS — J849 Interstitial pulmonary disease, unspecified: Secondary | ICD-10-CM

## 2020-02-25 DIAGNOSIS — I288 Other diseases of pulmonary vessels: Secondary | ICD-10-CM | POA: Diagnosis not present

## 2020-02-25 DIAGNOSIS — R911 Solitary pulmonary nodule: Secondary | ICD-10-CM | POA: Diagnosis not present

## 2020-02-25 MED ORDER — SPIRIVA RESPIMAT 2.5 MCG/ACT IN AERS: 2.0000 | INHALATION_SPRAY | Freq: Every day | RESPIRATORY_TRACT | 0 refills | Status: DC

## 2020-02-25 NOTE — Patient Instructions (Addendum)
Lung nodule - 14m Right Middle Lobe Sept 2019 -> Aug 2020 with increase - stable Nov 2020  Plan  - rpeat HRCT June/July 2021   Smoking  - please quit  -  Interstitial Lung Disease  - you might have  A variety called IPF or another variety called NSIP or DIP from smoking - some of this can be progressive  - youy like bronchoscopy method for more confirmatory diagnosis - but respect your reluctance for going through workup or anti-fibrotic medicine at this stage - regardless you need cardiac evaluatipn and clearance first  Plan - hold off diagnostic workup til cardiac evaluation complete  - do spirometry and dlco in June/july 2021  - rpeat HRCT June/July 2021  Pulmonary artery enlargement on CT  Plan- - get ECHO  - see Dr BHaroldine Lawsor Dr MAundra Dubinnext few to several weeks for evaluation of right heart cath  Followup   - June/july 2021 but after completing above

## 2020-02-25 NOTE — Progress Notes (Signed)
OV 09/04/2019  Subjective:  Patient ID: Elizabeth Melton, female , DOB: Jan 08, 1941 , age 79 y.o. , MRN: [id number] , ADDRESS: [address] [place] [address]   09/04/2019 -   Chief Complaint  Patient presents with  . Pulmonary Consult    Pt referred here by Dr. Drema Dallas for abnormal CT chest. Pt c/o DOE X years - states this is stable. Pt denies cough and CP/tightness and f/c/s.      HPI Elizabeth Melton 79 y.o. -presents to the interstitial lung disease clinic for evaluation of pulmonary fibrosis.  She was seen by the lung nodule program approximately a year ago when lung cancer screening CT chest showed interstitial lung disease changes.  The CT scan also showed a 7 mm right middle lobe nodule by the minor fissure.  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  [.  We then asked her to do interstitial lung disease questionnaire and serology and show for the clinic.  However she did not.  She is here today a year later.  In the interim she did have another CT scan of the chest.  The CT scan of the chest is not high-resolution CT chest.  I visualized the scan.  The 7 mm right middle lobe nodule has enlarged.  The ILD changes are still present.  She tells me that she skipped visit a year ago because of insurance issues.  [facility] ILD Questionnaire  Symptoms: Reports insidious onset of shortness of breath for the last few to several years.  He does the same since onset.  She also reports its episodic dyspnea.  SYMPTOM SCALE - ILD 09/04/2019   O2 use ra  Shortness of Breath 0 -> 5 scale with 5 being worst (score 6 If unable to do)  At rest 0  Simple tasks - showers, clothes change, eating, shaving 3  Household (dishes, doing bed, laundry) 3  Shopping 3  Walking level at own pace 3  Walking  keeping up with others of same age 66  Walking up Stairs 4.5  Walking up Hill 4.5  Total (40 - 48) Dyspnea Score x  How bad is your cough? x  How bad is your fatigue x    Her activities are limited by joint pain.  She also reports a cough it is the same since it started.  It is mild in intensity.  Occasionally brings out clear white phlegm.  It does affect her voice and she does clear her throat and she does feel a tickle in her throat.  No hemoptysis.    Past Medical History : Positive for diabetes blood clots not otherwise specified but negative for asthma, COPD, heart failure, rheumatoid arthritis, scleroderma, lupus, polymyositis, Sjogren's, GERD or hiatal hernia, sleep apnea, pulmonary hypertension, stroke.  No seizures.  Denies any heart disease or pleurisy   ROS: Positive for fatigue for the last few years and arthralgia for the last few years.  Also dry mouth for the last few years.  Does admit to snoring for unclear amount of time.  Has nonspecific rash for the last few weeks.  Denies any dysphagia or acid reflux or weight loss or ulcers in the mouth or vagina.   FAMILY HISTORY of LUNG DISEASE: Denies any COPD or pulmonary fibrosis or hypersensitivity pneumonitis  EXPOSURE HISTORY: Continues to smoke cigarettes since age 89.  He currently smokes 10 cigarettes a day.  She smokes cigars in the past.  Never smoked.  No electronic cigarette use.  No marijuana use currently but did smoke marijuana 20 years ago.  No cocaine use.  No intravenous drug use.   HOME and HOBBY DETAILS : Lives in a single-family [place] home for the last 34 years.  Extensive organic antigen exposure in the house history is negative.  In particular noted to have environment.  No more than will be on the shower curtain.  No mold or mildew anywhere in the house.  Does use a humidifier but no more than it.  Does not use a steam iron.  Does not have a Jacuzzi of swimming pool.  No misting found inside the house.  No  pet birds or parakeets.  No pet gerbils.  Does not use feather pillows.  Does not play any wind instruments.  Does not do much gardening.   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  Has worked as a Theme park manager.  Has worked as a Insurance claims handler with cosmetics.  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  Details are not known.  She has worked opening In jars of relaxer chemicals and during this time was exposed with his gas and fumes.  Rest are negative.   PULMONARY TOXICITY HISTORY (27 items):  -Denies         10/23/2019 Follow up : ILD , O2 RF  Patient returns for a 69-monthfollow-up.  She was seen for pulmonary consult last visit for possible pulmonary fibrosis noted on screening CT chest.  Patient is currently an active smoker.  Pulmonary function testing done today shows no airflow obstruction or restriction.  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  Moderate emphysema.  Dilation of pulmonary trunk concerning for pulmonary artery hypertension.  And aortic atherosclerosis. Overnight oximetry test 09/19/19/2020 showed nocturnal desaturations.  Patient was started on oxygen 2 L at nighttime.   Feels that since starting oxygen she is feeling better especially in the morning hours. She says she does get short of breath with activity.  But remains active and is able to do all of her housework and cooking without any difficulty.  Says she does occasionally have to stop and rest.  She does have some cough that is minimally productive. We discussed her test results.  And recent CT chest findings. She does not want to begin meds at this time, did discuss that she would discuss later dater if necessay but does not want to .   We discussed smoking cessation.  Declines pharm referral for smoking cessation   No Known Allergies  Study  Conclusions   - Left ventricle: The cavity size was normal. Wall thickness was  normal. Systolic function was normal. The estimated ejection  fraction was in the range of 60% to 65%. Wall motion was normal;  there were no regional wall motion abnormalities. Normal GLS at  -18%. Doppler parameters are consistent with abnormal left  ventricular relaxation (grade 1 diastolic dysfunction). The E/e&'  ratio is >15, suggesting elevated LV filling pressure.  - Aortic valve: Trileaflet. Sclerosis without stenosis. There was  mild to moderate regurgitation.  - Mitral valve: Mildly thickened leaflets . There was mild to  moderate regurgitation.  - Left atrium: Moderately  dilated.  - Tricuspid valve: There was moderate regurgitation.  - Pulmonary arteries: PA peak pressure: 48 mm Hg (S).  - Inferior vena cava: The vessel was normal in size. The  respirophasic diameter changes were in the normal range (>= 50%),  consistent with normal central venous pressure.   Impressions:   - Compared to a prior study in 2017, there are few changes. There  is mild to moderate AI, MR and moderate TR with a stable RVSP of  48 mmHg.    ROS - per HPI   OV 02/25/2020  Subjective:  Patient ID: Elizabeth Melton, female , DOB: [birth year] , age 8 y.o. , MRN: [id number] , ADDRESS: [address] [place] [address]   02/25/2020 -   Chief Complaint  Patient presents with  . Follow-up   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 and trace positive ANA [occupational farm work and cosmetic work]  Follow-up associated emphysema  Follow-up ongoing smoking  Background echocardiogram 2017 with moderate aortic incompetence moderate tricuspid regurgitation and right ventricular systolic pressure of 48  HPI Stephani L Boreman 52 y.o. -presents for follow-up.  Last visit with me was September 2020.  In the interim followed up with nurse practitioner nov 2020.  Initially was given nighttime  oxygen.  She said it helped but later she is now saying it did not help.  So she discontinued it.  She says it has made no difference.  In the interim she is continue to smoke.  She has significant amount of dyspnea burden as documented below she is also very fatigued.  However she is more bothered right now by her left knee pain and left ankle pain.  She is uses a cane chronically but the pain in her left ankle is been present only for a few days.  She got a talk about this with her primary care physician.  She continues to smoke.  She is not on any inhalers for emphysema.  At last visit with nurse practitioner she was reluctant to take antifibrotic's.   SYMPTOM SCALE - ILD 02/25/2020   O2 use ra  Shortness of Breath 0 -> 5 scale with 5 being worst (score 6 If unable to do)  At rest 1.5  Simple tasks - showers, clothes change, eating, shaving 3  Household (dishes, doing bed, laundry) 2.5  Shopping 3.5  Walking level at own pace 3.5  Walking up Stairs 4.5  Total (30-36) Dyspnea Score 18.5  How bad is your cough? 0  How bad is your fatigue 2.5  How bad is nausea 0  How bad is vomiting?  0  How bad is diarrhea? 0  How bad is anxiety? 0  How bad is depression 0         Simple office walk 185 feet x  3 laps goal with forehead probe 09/04/2019  02/25/2020   O2 used RA ra  Number laps completed 3 attempted but at stopped at 1 due to dyspnea an left knee pain Did not walk due to left heel pain. Has cane  Comments about pace slow   Resting Pulse Ox/HR 99% and 66/min   Final Pulse Ox/HR 93% and 105/min   Desaturated </= 88% No but did not do all 3   Desaturated <= 3% points yes   Got Tachycardic >/= 90/min Yes, 6 points   Symptoms at end of test Dyspnea and left knee pain   Miscellaneous comments Stopped early due to knee pain and dyspnea  ROS - per HPI  Results for TERRYANN, VERBEEK (MRN [id number]) as of 02/25/2020 12:29  Ref. Range 09/04/2019 14:19  CK Total Latest Ref Range:  29 - 143 U/L 196 (H)   Results for OFELIA, PODOLSKI (MRN [id number]) as of 02/25/2020 12:29  Ref. Range 09/04/2019 14:19  Anti Nuclear Antibody (ANA) Latest Ref Range: NEGATIVE  POSITIVE (A)  ANA Pattern 1 Unknown Cytoplasmic (A)  ANA Titer 1 Latest Units: titer 1:80 (H)  Results for SHANA, ZAVALETA (MRN [id number]) as of 02/25/2020 12:29  Ref. Range 09/04/2019 14:19  Sed Rate Latest Ref Range: 0 - 30 mm/hr 40 (H)    has a past medical history of Diabetes mellitus without complication ([facility]), Hyperlipidemia, Hypothyroidism, and Thrombocytopenia ([place]).   reports that she has been smoking cigarettes. She has a 45.00 pack-year smoking history. She quit smokeless tobacco use about 57 years ago.  Past Surgical History:  Procedure Laterality Date  . TUBAL LIGATION      No Known Allergies   There is no immunization history on file for this patient.  Family History  Problem Relation Age of Onset  . Diabetes Mother   . Kidney disease Mother   . Hypertension Mother   . Other Father        tuberculosis  . Hypertension Sister   . Hyperlipidemia Sister      Current Outpatient Medications:  .  acetaminophen (TYLENOL) 500 MG tablet, Take 500 mg by mouth every 6 (six) hours as needed., Disp: , Rfl:  .  b complex vitamins tablet, Take 1 tablet by mouth daily., Disp: , Rfl:  .  Calcium Carb-Cholecalciferol (CALCIUM + D3) 600-200 MG-UNIT TABS, Take 2 tablets by mouth daily. , Disp: , Rfl:  .  cholecalciferol (VITAMIN D3) 25 MCG (1000 UT) tablet, Take 1,000 Units by mouth daily., Disp: , Rfl:  .  glucose blood test strip, 1 each by Other route as needed. Use as instructed, Disp: , Rfl:  .  levothyroxine (SYNTHROID, LEVOTHROID) 75 MCG tablet, Take 75 mcg by mouth daily., Disp: , Rfl:  .  lovastatin (MEVACOR) 40 MG tablet, Take 40 mg by mouth at bedtime., Disp: , Rfl:  .  Omega-3 1000 MG CAPS, Take by mouth., Disp: , Rfl:  .  SMART SENSE THIN LANCETS 26G MISC, by Does not apply route., Disp:  , Rfl:  .  Tiotropium Bromide Monohydrate (SPIRIVA RESPIMAT) 2.5 MCG/ACT AERS, Inhale 2 puffs into the lungs daily., Disp: 8 g, Rfl: 0      Objective:   Vitals:   02/25/20 1157  BP: (!) 162/72  Pulse: 82  Temp: (!) 97.3 F (36.3 C)  TempSrc: Temporal  SpO2: 96%  Weight: 180 lb 12.8 oz (82 kg)  Height: _0  (1.676 m)    Estimated body mass index is 29.18 kg/m as calculated from the following:   Height as of this encounter: _1  (1.676 m).   Weight as of this encounter: 180 lb 12.8 oz (82 kg).  _2 @  Filed Weights   02/25/20 1157  Weight: 180 lb 12.8 oz (82 kg)     Physical Exam Overweight female sitting with a cane.  Scattered crackles present.  Visceral obesity present.  No stigmata of connective tissue disease.  No distress.    Assessment:       ICD-10-CM   1. ILD (interstitial lung disease) ([facility])  J84.9 ECHOCARDIOGRAM COMPLETE  2. Nocturnal hypoxia  G47.34   3. Tobacco abuse  Z72.0   4.  Solitary pulmonary nodule  R91.1   5. 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 She Is not ready for diagnostics and concept of fibrosis and anti-fibrotics. Also, at risk for bronchoscopy esp ith enlarged PA. Will get RHC eval first.     Plan:     Patient Instructions  Lung nodule - 52m Right Middle Lobe Sept 2019 -> Aug 2020 with increase - stable Nov 2020  Plan  - rpeat HRCT June/July 2021   Smoking  - please quit  -  Interstitial Lung Disease  - you might have  A variety called IPF or another variety called NSIP or DIP from smoking - some of this can be progressive  - youy like bronchoscopy method for more confirmatory diagnosis - but respect your reluctance for going through workup or anti-fibrotic medicine at this stage - regardless you need cardiac evaluatipn and clearance first  Plan - hold off diagnostic workup til cardiac evaluation complete  - do spirometry and dlco in June/july 2021  - rpeat HRCT June/July 2021  Pulmonary  artery enlargement on CT  Plan- - get ECHO  - see Dr BHaroldine Lawsor Dr MAundra Dubinnext few to several weeks for evaluation of right heart cath  Followup   - June/july 2021 but after completing above  ( Level 05 visit: Estb 40-54 min  in  visit type: on-site physical face to visit  in total care time and counseling or/and coordination of care by this undersigned MD - Dr MBrand Males This includes one or more of the following on this same day 02/25/2020: pre-charting, chart review, note writing, documentation discussion of test results, diagnostic or treatment recommendations, prognosis, risks and benefits of management options, instructions, education, compliance or risk-factor reduction. It excludes time spent by the CNorveltor office staff in the care of the patient. Actual time 40 min)    SIGNATURE    Dr. MBrand Males M.D., F.C.C.P,  Pulmonary and Critical Care Medicine Staff Physician, CElk RapidsDirector - Interstitial Lung Disease  Program  Pulmonary [place] [address] Pager: [phone] If no answer or between  15:00h - 7:00h: call [phone] Telephone: [phone]  3:21 PM 02/25/2020

## 2020-03-08 ENCOUNTER — Ambulatory Visit (HOSPITAL_COMMUNITY): Payer: Medicare HMO | Attending: Internal Medicine

## 2020-03-13 ENCOUNTER — Other Ambulatory Visit (HOSPITAL_COMMUNITY)
Admission: RE | Admit: 2020-03-13 | Discharge: 2020-03-13 | Disposition: A | Discharge status: Home / Self Care | Payer: Medicare HMO | Source: Ambulatory Visit | Attending: Internal Medicine | Admitting: Internal Medicine

## 2020-03-13 DIAGNOSIS — Z01812 Encounter for preprocedural laboratory examination: Secondary | ICD-10-CM | POA: Insufficient documentation

## 2020-03-13 DIAGNOSIS — Z20822 Contact with and (suspected) exposure to covid-19: Secondary | ICD-10-CM | POA: Diagnosis not present

## 2020-03-13 LAB — SARS CORONAVIRUS 2 (TAT 6-24 HRS): SARS Coronavirus 2: NEGATIVE

## 2020-03-16 ENCOUNTER — Other Ambulatory Visit: Payer: Self-pay | Admitting: Internal Medicine

## 2020-03-16 DIAGNOSIS — J849 Interstitial pulmonary disease, unspecified: Secondary | ICD-10-CM

## 2020-03-17 ENCOUNTER — Ambulatory Visit: Payer: Medicare HMO | Admitting: Internal Medicine

## 2020-03-17 ENCOUNTER — Other Ambulatory Visit: Payer: Self-pay

## 2020-03-17 DIAGNOSIS — J849 Interstitial pulmonary disease, unspecified: Secondary | ICD-10-CM | POA: Diagnosis not present

## 2020-03-17 LAB — PULMONARY FUNCTION TEST
DL/VA % pred: 72 %
DL/VA: 2.99 ml/min/mmHg/L
DLCO cor % pred: 47 %
DLCO cor: 8.93 ml/min/mmHg
DLCO unc % pred: 47 %
DLCO unc: 8.93 ml/min/mmHg
FEF 25-75 Pre: 1.1 L/sec
FEF2575-%Pred-Pre: 78 %
FEV1-%Pred-Pre: 99 %
FEV1-Pre: 1.59 L
FEV1FVC-%Pred-Pre: 97 %
FEV6-%Pred-Pre: 108 %
FEV6-Pre: 2.15 L
FEV6FVC-%Pred-Pre: 104 %
FVC-%Pred-Pre: 103 %
FVC-Pre: 2.15 L
Pre FEV1/FVC ratio: 74 %
Pre FEV6/FVC Ratio: 100 %

## 2020-03-17 NOTE — Progress Notes (Signed)
Spirometry and Dlco done today. 

## 2020-03-21 ENCOUNTER — Telehealth: Payer: Self-pay | Admitting: Internal Medicine

## 2020-03-21 NOTE — Telephone Encounter (Signed)
I see her PFT report. I do not see followup with me or appt with DR Bensimohn. Will be opening up clinic in June soon. Please schedule

## 2020-03-22 NOTE — Telephone Encounter (Signed)
Recall has been placed for pt to have appt scheduled in June. Nothing further needed.

## 2020-03-23 DIAGNOSIS — E039 Hypothyroidism, unspecified: Secondary | ICD-10-CM | POA: Diagnosis not present

## 2020-03-23 DIAGNOSIS — I10 Essential (primary) hypertension: Secondary | ICD-10-CM | POA: Diagnosis not present

## 2020-03-23 DIAGNOSIS — E785 Hyperlipidemia, unspecified: Secondary | ICD-10-CM | POA: Diagnosis not present

## 2020-03-23 DIAGNOSIS — M810 Age-related osteoporosis without current pathological fracture: Secondary | ICD-10-CM | POA: Diagnosis not present

## 2020-03-23 DIAGNOSIS — E119 Type 2 diabetes mellitus without complications: Secondary | ICD-10-CM | POA: Diagnosis not present

## 2020-03-23 DIAGNOSIS — J449 Chronic obstructive pulmonary disease, unspecified: Secondary | ICD-10-CM | POA: Diagnosis not present

## 2020-03-26 DIAGNOSIS — Z720 Tobacco use: Secondary | ICD-10-CM | POA: Diagnosis not present

## 2020-03-26 DIAGNOSIS — F1220 Cannabis dependence, uncomplicated: Secondary | ICD-10-CM | POA: Diagnosis not present

## 2020-03-26 DIAGNOSIS — J849 Interstitial pulmonary disease, unspecified: Secondary | ICD-10-CM | POA: Diagnosis not present

## 2020-03-29 ENCOUNTER — Other Ambulatory Visit: Payer: Self-pay

## 2020-03-29 ENCOUNTER — Ambulatory Visit (HOSPITAL_COMMUNITY): Payer: Medicare HMO | Attending: Cardiovascular Disease

## 2020-03-29 DIAGNOSIS — E785 Hyperlipidemia, unspecified: Secondary | ICD-10-CM | POA: Insufficient documentation

## 2020-03-29 DIAGNOSIS — I340 Nonrheumatic mitral (valve) insufficiency: Secondary | ICD-10-CM | POA: Diagnosis not present

## 2020-03-29 DIAGNOSIS — I272 Pulmonary hypertension, unspecified: Secondary | ICD-10-CM

## 2020-03-29 DIAGNOSIS — J849 Interstitial pulmonary disease, unspecified: Secondary | ICD-10-CM

## 2020-03-29 DIAGNOSIS — I10 Essential (primary) hypertension: Secondary | ICD-10-CM | POA: Insufficient documentation

## 2020-03-30 ENCOUNTER — Other Ambulatory Visit: Payer: Self-pay | Admitting: *Deleted

## 2020-03-30 DIAGNOSIS — Z87891 Personal history of nicotine dependence: Secondary | ICD-10-CM

## 2020-03-30 DIAGNOSIS — F17210 Nicotine dependence, cigarettes, uncomplicated: Secondary | ICD-10-CM

## 2020-05-07 ENCOUNTER — Telehealth: Payer: Self-pay | Admitting: Internal Medicine

## 2020-05-07 DIAGNOSIS — I288 Other diseases of pulmonary vessels: Secondary | ICD-10-CM

## 2020-05-07 DIAGNOSIS — J849 Interstitial pulmonary disease, unspecified: Secondary | ICD-10-CM

## 2020-05-07 NOTE — Telephone Encounter (Signed)
Echo April 2021 shows heart muscle stiffnesss . Still do not see cardiac appt for eval for RHC.   pls give appt for cards pls give appt to see me in July 2021

## 2020-05-11 NOTE — Telephone Encounter (Signed)
Instructions from last OV 02/25/20  Lung nodule - 94m Right Middle Lobe Sept 2019 -> Aug 2020 with increase - stable Nov 2020  Plan  - rpeat HRCT June/July 2021   Smoking  - please quit  -  Interstitial Lung Disease  - you might have  A variety called IPF or another variety called NSIP or DIP from smoking - some of this can be progressive  - youy like bronchoscopy method for more confirmatory diagnosis - but respect your reluctance for going through workup or anti-fibrotic medicine at this stage - regardless you need cardiac evaluatipn and clearance first  Plan - hold off diagnostic workup til cardiac evaluation complete  - do spirometry and dlco in June/july 2021  - rpeat HRCT June/July 2021  Pulmonary artery enlargement on CT  Plan- - get ECHO  - see Dr BHaroldine Lawsor Dr MAundra Dubinnext few to several weeks for evaluation of right heart cath  Followup   - June/july 2021 but after completing above       No order was ever placed for the cardiology referral at pt's last OV. Referral has been placed. There is also a recall in pt's chart for their next OV and PFT.

## 2020-05-13 DIAGNOSIS — R911 Solitary pulmonary nodule: Secondary | ICD-10-CM | POA: Diagnosis not present

## 2020-05-13 DIAGNOSIS — R0989 Other specified symptoms and signs involving the circulatory and respiratory systems: Secondary | ICD-10-CM | POA: Diagnosis not present

## 2020-05-13 DIAGNOSIS — R7989 Other specified abnormal findings of blood chemistry: Secondary | ICD-10-CM | POA: Diagnosis not present

## 2020-05-13 DIAGNOSIS — E785 Hyperlipidemia, unspecified: Secondary | ICD-10-CM | POA: Diagnosis not present

## 2020-05-13 DIAGNOSIS — Z720 Tobacco use: Secondary | ICD-10-CM | POA: Diagnosis not present

## 2020-05-13 DIAGNOSIS — Z0389 Encounter for observation for other suspected diseases and conditions ruled out: Secondary | ICD-10-CM | POA: Diagnosis not present

## 2020-05-13 DIAGNOSIS — M810 Age-related osteoporosis without current pathological fracture: Secondary | ICD-10-CM | POA: Diagnosis not present

## 2020-05-13 DIAGNOSIS — E039 Hypothyroidism, unspecified: Secondary | ICD-10-CM | POA: Diagnosis not present

## 2020-05-13 DIAGNOSIS — J849 Interstitial pulmonary disease, unspecified: Secondary | ICD-10-CM | POA: Diagnosis not present

## 2020-05-13 DIAGNOSIS — E1169 Type 2 diabetes mellitus with other specified complication: Secondary | ICD-10-CM | POA: Diagnosis not present

## 2020-05-13 DIAGNOSIS — E559 Vitamin D deficiency, unspecified: Secondary | ICD-10-CM | POA: Diagnosis not present

## 2020-05-13 DIAGNOSIS — I10 Essential (primary) hypertension: Secondary | ICD-10-CM | POA: Diagnosis not present

## 2020-07-23 ENCOUNTER — Telehealth (HOSPITAL_COMMUNITY): Payer: Self-pay | Admitting: *Deleted

## 2020-07-23 ENCOUNTER — Encounter (HOSPITAL_COMMUNITY): Payer: Self-pay | Admitting: *Deleted

## 2020-07-23 NOTE — Telephone Encounter (Signed)
Attempted to reach pt by phone to go over cath instructions. No answer/left vm for pt to return call. Cath instructions mailed to pts home.   [facility] [address] [place], [place]  [address] Phone:  [phone]   Fax:  [phone]   July 23, 2020  MRN: [id number]  Elizabeth Melton [address] [place] [address]  Dear Elizabeth Melton,  You are scheduled for Cardiac Catheterization on Thursday 08/05/2020 with Dr. Haroldine Laws.  Please arrive at [facility] at 7:00 a.m. on the day of your procedure.  You must have a Covid test 3 days before your procedure and you will need to quarantine after your test until your procedure. Please see appointment below. Monday  08/02/2020 at 12:05pm. This is a Drive Up Visitat [address] [place] [place], [place] [address] Someone will direct you to the appropriate testing line. Stay in your car and someone will be with you shortly.  1. DIET _X__ Nothing to eat or drink after midnight except your medications with a sip of water.  3. Plan for one night stay - bring personal belongings (i.e. toothpaste, toothbrush, etc.)  4. Bring a current list of your medications and current insurance cards.  5. Must have a responsible person to drive you home.  6. Someone must be with you for the first 24 hours after you arrive home.  7. Please wear clothes that are easy to get on and off and wear slip-on shoes.  * Special note: Every effort is made to have your procedure done on time. Occasionally there are emergencies that present themselves at the hospital that may cause delays. Please be patient if a delay does occur.  If you have any questions after you get home, please call the office at the number listed above.  Harvie Junior  [facility]                            [place], [phone]                                    1

## 2020-07-30 ENCOUNTER — Telehealth (HOSPITAL_COMMUNITY): Payer: Self-pay | Admitting: Internal Medicine

## 2020-07-30 NOTE — Telephone Encounter (Signed)
Pt stated she is not able to do the procedure on 08/26, or  Lab on 08/23 please call to r/s [phone]. thanks

## 2020-07-30 NOTE — Telephone Encounter (Signed)
Left VM requesting pt return call to r/s cath.

## 2020-08-02 ENCOUNTER — Other Ambulatory Visit (HOSPITAL_COMMUNITY): Payer: Medicare HMO

## 2020-08-06 ENCOUNTER — Encounter (HOSPITAL_COMMUNITY): Payer: Self-pay

## 2020-08-06 ENCOUNTER — Ambulatory Visit (HOSPITAL_COMMUNITY): Admit: 2020-08-06 | Payer: Medicare HMO | Admitting: Internal Medicine

## 2020-08-06 SURGERY — RIGHT HEART CATH
Anesthesia: LOCAL

## 2020-09-05 ENCOUNTER — Encounter (HOSPITAL_COMMUNITY): Payer: Self-pay | Admitting: Emergency Medicine

## 2020-09-05 ENCOUNTER — Other Ambulatory Visit: Payer: Self-pay

## 2020-09-05 ENCOUNTER — Inpatient Hospital Stay (HOSPITAL_COMMUNITY)
Admission: EM | Admit: 2020-09-05 | Discharge: 2020-09-10 | DRG: 175 | Disposition: A | Discharge status: Home Health | Payer: Medicare HMO | Attending: Student | Admitting: Student

## 2020-09-05 ENCOUNTER — Emergency Department (HOSPITAL_COMMUNITY): Payer: Medicare HMO

## 2020-09-05 DIAGNOSIS — Z79899 Other long term (current) drug therapy: Secondary | ICD-10-CM

## 2020-09-05 DIAGNOSIS — J9611 Chronic respiratory failure with hypoxia: Secondary | ICD-10-CM | POA: Diagnosis not present

## 2020-09-05 DIAGNOSIS — I2692 Saddle embolus of pulmonary artery without acute cor pulmonale: Secondary | ICD-10-CM | POA: Diagnosis not present

## 2020-09-05 DIAGNOSIS — D6959 Other secondary thrombocytopenia: Secondary | ICD-10-CM | POA: Diagnosis present

## 2020-09-05 DIAGNOSIS — I351 Nonrheumatic aortic (valve) insufficiency: Secondary | ICD-10-CM | POA: Diagnosis not present

## 2020-09-05 DIAGNOSIS — I82431 Acute embolism and thrombosis of right popliteal vein: Secondary | ICD-10-CM | POA: Diagnosis present

## 2020-09-05 DIAGNOSIS — E1165 Type 2 diabetes mellitus with hyperglycemia: Secondary | ICD-10-CM

## 2020-09-05 DIAGNOSIS — E785 Hyperlipidemia, unspecified: Secondary | ICD-10-CM | POA: Diagnosis present

## 2020-09-05 DIAGNOSIS — I272 Pulmonary hypertension, unspecified: Secondary | ICD-10-CM | POA: Diagnosis not present

## 2020-09-05 DIAGNOSIS — J849 Interstitial pulmonary disease, unspecified: Secondary | ICD-10-CM | POA: Diagnosis not present

## 2020-09-05 DIAGNOSIS — I82441 Acute embolism and thrombosis of right tibial vein: Secondary | ICD-10-CM | POA: Diagnosis not present

## 2020-09-05 DIAGNOSIS — Z20822 Contact with and (suspected) exposure to covid-19: Secondary | ICD-10-CM | POA: Diagnosis not present

## 2020-09-05 DIAGNOSIS — Z86711 Personal history of pulmonary embolism: Secondary | ICD-10-CM | POA: Diagnosis not present

## 2020-09-05 DIAGNOSIS — Z720 Tobacco use: Secondary | ICD-10-CM | POA: Diagnosis present

## 2020-09-05 DIAGNOSIS — J9601 Acute respiratory failure with hypoxia: Secondary | ICD-10-CM | POA: Diagnosis not present

## 2020-09-05 DIAGNOSIS — E1151 Type 2 diabetes mellitus with diabetic peripheral angiopathy without gangrene: Secondary | ICD-10-CM | POA: Diagnosis present

## 2020-09-05 DIAGNOSIS — R0902 Hypoxemia: Secondary | ICD-10-CM | POA: Diagnosis not present

## 2020-09-05 DIAGNOSIS — I2699 Other pulmonary embolism without acute cor pulmonale: Secondary | ICD-10-CM | POA: Diagnosis not present

## 2020-09-05 DIAGNOSIS — I10 Essential (primary) hypertension: Secondary | ICD-10-CM | POA: Diagnosis not present

## 2020-09-05 DIAGNOSIS — Z79890 Hormone replacement therapy (postmenopausal): Secondary | ICD-10-CM | POA: Diagnosis not present

## 2020-09-05 DIAGNOSIS — F17210 Nicotine dependence, cigarettes, uncomplicated: Secondary | ICD-10-CM | POA: Diagnosis present

## 2020-09-05 DIAGNOSIS — R5381 Other malaise: Secondary | ICD-10-CM | POA: Diagnosis not present

## 2020-09-05 DIAGNOSIS — R0689 Other abnormalities of breathing: Secondary | ICD-10-CM | POA: Diagnosis not present

## 2020-09-05 DIAGNOSIS — I517 Cardiomegaly: Secondary | ICD-10-CM | POA: Diagnosis not present

## 2020-09-05 DIAGNOSIS — Z7951 Long term (current) use of inhaled steroids: Secondary | ICD-10-CM | POA: Diagnosis not present

## 2020-09-05 DIAGNOSIS — I82451 Acute embolism and thrombosis of right peroneal vein: Secondary | ICD-10-CM | POA: Diagnosis present

## 2020-09-05 DIAGNOSIS — I340 Nonrheumatic mitral (valve) insufficiency: Secondary | ICD-10-CM | POA: Diagnosis not present

## 2020-09-05 DIAGNOSIS — Z86718 Personal history of other venous thrombosis and embolism: Secondary | ICD-10-CM

## 2020-09-05 DIAGNOSIS — J9600 Acute respiratory failure, unspecified whether with hypoxia or hypercapnia: Secondary | ICD-10-CM

## 2020-09-05 DIAGNOSIS — R918 Other nonspecific abnormal finding of lung field: Secondary | ICD-10-CM | POA: Diagnosis not present

## 2020-09-05 DIAGNOSIS — J9621 Acute and chronic respiratory failure with hypoxia: Secondary | ICD-10-CM | POA: Diagnosis present

## 2020-09-05 DIAGNOSIS — D696 Thrombocytopenia, unspecified: Secondary | ICD-10-CM | POA: Diagnosis not present

## 2020-09-05 DIAGNOSIS — J84112 Idiopathic pulmonary fibrosis: Secondary | ICD-10-CM | POA: Diagnosis present

## 2020-09-05 DIAGNOSIS — I82401 Acute embolism and thrombosis of unspecified deep veins of right lower extremity: Secondary | ICD-10-CM | POA: Diagnosis not present

## 2020-09-05 DIAGNOSIS — R0602 Shortness of breath: Secondary | ICD-10-CM | POA: Diagnosis not present

## 2020-09-05 DIAGNOSIS — I2602 Saddle embolus of pulmonary artery with acute cor pulmonale: Secondary | ICD-10-CM

## 2020-09-05 DIAGNOSIS — R0789 Other chest pain: Secondary | ICD-10-CM | POA: Diagnosis not present

## 2020-09-05 DIAGNOSIS — J811 Chronic pulmonary edema: Secondary | ICD-10-CM | POA: Diagnosis not present

## 2020-09-05 DIAGNOSIS — E1169 Type 2 diabetes mellitus with other specified complication: Secondary | ICD-10-CM

## 2020-09-05 DIAGNOSIS — E119 Type 2 diabetes mellitus without complications: Secondary | ICD-10-CM | POA: Diagnosis not present

## 2020-09-05 DIAGNOSIS — R069 Unspecified abnormalities of breathing: Secondary | ICD-10-CM | POA: Diagnosis not present

## 2020-09-05 DIAGNOSIS — E039 Hypothyroidism, unspecified: Secondary | ICD-10-CM | POA: Diagnosis not present

## 2020-09-05 DIAGNOSIS — J9811 Atelectasis: Secondary | ICD-10-CM | POA: Diagnosis not present

## 2020-09-05 LAB — COMPREHENSIVE METABOLIC PANEL
ALT: 67 U/L — ABNORMAL HIGH (ref 0–44)
AST: 69 U/L — ABNORMAL HIGH (ref 15–41)
Albumin: 3.6 g/dL (ref 3.5–5.0)
Alkaline Phosphatase: 86 U/L (ref 38–126)
Anion gap: 12 (ref 5–15)
BUN: 26 mg/dL — ABNORMAL HIGH (ref 8–23)
CO2: 21 mmol/L — ABNORMAL LOW (ref 22–32)
Calcium: 8.9 mg/dL (ref 8.9–10.3)
Chloride: 106 mmol/L (ref 98–111)
Creatinine, Ser: 1.05 mg/dL — ABNORMAL HIGH (ref 0.44–1.00)
GFR calc Af Amer: 59 mL/min — ABNORMAL LOW (ref >60)
GFR calc non Af Amer: 51 mL/min — ABNORMAL LOW (ref >60)
Glucose, Bld: 171 mg/dL — ABNORMAL HIGH (ref 70–99)
Potassium: 4.2 mmol/L (ref 3.5–5.1)
Sodium: 139 mmol/L (ref 135–145)
Total Bilirubin: 0.9 mg/dL (ref 0.3–1.2)
Total Protein: 8.2 g/dL — ABNORMAL HIGH (ref 6.5–8.1)

## 2020-09-05 LAB — CBC WITH DIFFERENTIAL/PLATELET
Abs Immature Granulocytes: 0.06 10*3/uL (ref 0.00–0.07)
Basophils Absolute: 0 10*3/uL (ref 0.0–0.1)
Basophils Relative: 0 %
Eosinophils Absolute: 0 10*3/uL (ref 0.0–0.5)
Eosinophils Relative: 0 %
HCT: 49.1 % — ABNORMAL HIGH (ref 36.0–46.0)
Hemoglobin: 16.1 g/dL — ABNORMAL HIGH (ref 12.0–15.0)
Immature Granulocytes: 0 %
Lymphocytes Relative: 13 %
Lymphs Abs: 1.9 10*3/uL (ref 0.7–4.0)
MCH: 33.3 pg (ref 26.0–34.0)
MCHC: 32.8 g/dL (ref 30.0–36.0)
MCV: 101.4 fL — ABNORMAL HIGH (ref 80.0–100.0)
Monocytes Absolute: 0.9 10*3/uL (ref 0.1–1.0)
Monocytes Relative: 6 %
Neutro Abs: 11.7 10*3/uL — ABNORMAL HIGH (ref 1.7–7.7)
Neutrophils Relative %: 81 %
Platelets: 102 10*3/uL — ABNORMAL LOW (ref 150–400)
RBC: 4.84 MIL/uL (ref 3.87–5.11)
RDW: 15.2 % (ref 11.5–15.5)
WBC: 14.6 10*3/uL — ABNORMAL HIGH (ref 4.0–10.5)
nRBC: 0.1 % (ref 0.0–0.2)

## 2020-09-05 LAB — FIBRINOGEN: Fibrinogen: 405 mg/dL (ref 210–475)

## 2020-09-05 LAB — TRIGLYCERIDES: Triglycerides: 75 mg/dL (ref <150)

## 2020-09-05 LAB — PROTIME-INR
INR: 1.3 — ABNORMAL HIGH (ref 0.8–1.2)
Prothrombin Time: 15.9 seconds — ABNORMAL HIGH (ref 11.4–15.2)

## 2020-09-05 LAB — FERRITIN: Ferritin: 537 ng/mL — ABNORMAL HIGH (ref 11–307)

## 2020-09-05 LAB — BRAIN NATRIURETIC PEPTIDE: B Natriuretic Peptide: 850 pg/mL — ABNORMAL HIGH (ref 0.0–100.0)

## 2020-09-05 LAB — D-DIMER, QUANTITATIVE: D-Dimer, Quant: >20 ug/mL-FEU — ABNORMAL HIGH (ref 0.00–0.50)

## 2020-09-05 LAB — MRSA PCR SCREENING: MRSA by PCR: NEGATIVE

## 2020-09-05 LAB — APTT: aPTT: 32 seconds (ref 24–36)

## 2020-09-05 LAB — SARS CORONAVIRUS 2 BY RT PCR (HOSPITAL ORDER, PERFORMED IN ~~LOC~~ HOSPITAL LAB): SARS Coronavirus 2: NEGATIVE

## 2020-09-05 LAB — C-REACTIVE PROTEIN: CRP: 10.4 mg/dL — ABNORMAL HIGH (ref <1.0)

## 2020-09-05 LAB — LACTIC ACID, PLASMA
Lactic Acid, Venous: 3.4 mmol/L (ref 0.5–1.9)
Lactic Acid, Venous: 1.8 mmol/L (ref 0.5–1.9)

## 2020-09-05 LAB — PROCALCITONIN: Procalcitonin: 0.16 ng/mL

## 2020-09-05 LAB — LACTATE DEHYDROGENASE: LDH: 279 U/L — ABNORMAL HIGH (ref 98–192)

## 2020-09-05 MED ORDER — CHLORHEXIDINE GLUCONATE CLOTH 2 % EX PADS
6.0000 | MEDICATED_PAD | Freq: Every day | CUTANEOUS | Status: DC
  Administered 2020-09-05 – 2020-09-09 (×4): 6 via TOPICAL

## 2020-09-05 MED ORDER — SENNOSIDES-DOCUSATE SODIUM 8.6-50 MG PO TABS
1.0000 | ORAL_TABLET | Freq: Every evening | ORAL | Status: DC | PRN
  Administered 2020-09-08: 1 via ORAL
  Filled 2020-09-05: qty 1

## 2020-09-05 MED ORDER — HEPARIN (PORCINE) 25000 UT/250ML-% IV SOLN
1200.0000 [IU]/h | INTRAVENOUS | Status: DC
  Administered 2020-09-05 – 2020-09-08 (×4): 1200 [IU]/h via INTRAVENOUS
  Filled 2020-09-05 (×5): qty 250

## 2020-09-05 MED ORDER — CHLORHEXIDINE GLUCONATE 0.12 % MT SOLN
15.0000 mL | Freq: Two times a day (BID) | OROMUCOSAL | Status: DC
  Administered 2020-09-05 – 2020-09-07 (×4): 15 mL via OROMUCOSAL
  Filled 2020-09-05 (×4): qty 15

## 2020-09-05 MED ORDER — ACETAMINOPHEN 325 MG PO TABS: 650.0000 mg | ORAL_TABLET | Freq: Four times a day (QID) | ORAL | Status: DC | PRN

## 2020-09-05 MED ORDER — ALBUTEROL SULFATE (2.5 MG/3ML) 0.083% IN NEBU: 2.5000 mg | INHALATION_SOLUTION | RESPIRATORY_TRACT | Status: DC | PRN

## 2020-09-05 MED ORDER — HEPARIN BOLUS VIA INFUSION
2500.0000 [IU] | Freq: Once | INTRAVENOUS | Status: AC
  Administered 2020-09-05: 2500 [IU] via INTRAVENOUS
  Filled 2020-09-05: qty 2500

## 2020-09-05 MED ORDER — ORAL CARE MOUTH RINSE
15.0000 mL | Freq: Two times a day (BID) | OROMUCOSAL | Status: DC
  Administered 2020-09-06: 15 mL via OROMUCOSAL

## 2020-09-05 MED ORDER — IOHEXOL 350 MG/ML SOLN
100.0000 mL | Freq: Once | INTRAVENOUS | Status: AC | PRN
  Administered 2020-09-05: 100 mL via INTRAVENOUS

## 2020-09-05 MED ORDER — TIOTROPIUM BROMIDE MONOHYDRATE 2.5 MCG/ACT IN AERS: 2.0000 | INHALATION_SPRAY | Freq: Every day | RESPIRATORY_TRACT | Status: DC

## 2020-09-05 MED ORDER — SODIUM CHLORIDE 0.9% FLUSH
3.0000 mL | Freq: Two times a day (BID) | INTRAVENOUS | Status: DC
  Administered 2020-09-06 – 2020-09-09 (×7): 3 mL via INTRAVENOUS

## 2020-09-05 MED ORDER — NICOTINE 21 MG/24HR TD PT24
21.0000 mg | MEDICATED_PATCH | Freq: Every day | TRANSDERMAL | Status: DC
  Filled 2020-09-05 (×4): qty 1

## 2020-09-05 MED ORDER — UMECLIDINIUM BROMIDE 62.5 MCG/INH IN AEPB
1.0000 | INHALATION_SPRAY | Freq: Every day | RESPIRATORY_TRACT | Status: DC
  Administered 2020-09-06 – 2020-09-10 (×5): 1 via RESPIRATORY_TRACT
  Filled 2020-09-05: qty 7

## 2020-09-05 MED ORDER — LEVOTHYROXINE SODIUM 75 MCG PO TABS
75.0000 ug | ORAL_TABLET | Freq: Every day | ORAL | Status: DC
  Administered 2020-09-06 – 2020-09-10 (×5): 75 ug via ORAL
  Filled 2020-09-05 (×6): qty 1

## 2020-09-05 MED ORDER — PRAVASTATIN SODIUM 20 MG PO TABS
40.0000 mg | ORAL_TABLET | Freq: Every day | ORAL | Status: DC
  Administered 2020-09-06 – 2020-09-09 (×4): 40 mg via ORAL
  Filled 2020-09-05 (×4): qty 2

## 2020-09-05 MED ORDER — SODIUM CHLORIDE (PF) 0.9 % IJ SOLN
INTRAMUSCULAR | Status: AC
  Administered 2020-09-05: 3 mL via INTRAVENOUS
  Filled 2020-09-05: qty 50

## 2020-09-05 MED ORDER — BISACODYL 10 MG RE SUPP: 10.0000 mg | Freq: Every day | RECTAL | Status: DC | PRN

## 2020-09-05 MED ORDER — FENTANYL CITRATE (PF) 100 MCG/2ML IJ SOLN: 50.0000 ug | Freq: Once | INTRAMUSCULAR | Status: DC

## 2020-09-05 MED ORDER — OXYCODONE HCL 5 MG PO TABS
5.0000 mg | ORAL_TABLET | ORAL | Status: DC | PRN
  Administered 2020-09-05 – 2020-09-07 (×3): 5 mg via ORAL
  Filled 2020-09-05 (×3): qty 1

## 2020-09-05 MED ORDER — HYDROMORPHONE HCL 1 MG/ML IJ SOLN
0.5000 mg | INTRAMUSCULAR | Status: DC | PRN
  Administered 2020-09-06 (×3): 0.5 mg via INTRAVENOUS
  Filled 2020-09-05 (×3): qty 1

## 2020-09-05 MED ORDER — ACETAMINOPHEN 650 MG RE SUPP: 650.0000 mg | Freq: Four times a day (QID) | RECTAL | Status: DC | PRN

## 2020-09-05 NOTE — ED Notes (Signed)
Pt transport to CT

## 2020-09-05 NOTE — Progress Notes (Signed)
ANTICOAGULATION CONSULT NOTE - Initial Consult  Pharmacy Consult for Heparin Indication: pulmonary embolus  No Known Allergies  Patient Measurements:    Estimated height: 66 inches Heparin Dosing Weight:   Vital Signs: Temp: 97.7 F (36.5 C) (09/26 1450) Temp Source: Oral (09/26 1450) BP: 142/63 (09/26 1853) Pulse Rate: 91 (09/26 1853)  Labs: Recent Labs    09/05/20 1454  HGB 16.1*  HCT 49.1*  PLT 102*  CREATININE 1.05*    CrCl cannot be calculated (Unknown ideal weight.).   Medical History: Past Medical History:  Diagnosis Date   Diabetes mellitus without complication (HCC)    Hyperlipidemia    Hypothyroidism    Thrombocytopenia (HCC)     Medications:  Scheduled:  Infusions:  PRN:   Assessment: 79 yo female with interstitial lung disease presents with shortness of breath x 3 days.  CTa shows large PE.  Pharmacy consulted to dose IV heparin.  No PTA anticoagulant use reported.   Baseline PTT, PT/INR pending  H/H elevated, Plts low (chronic per MD note)  SCr 1.05  D-dimer > 20  Goal of Therapy:  Heparin level 0.3-0.7 units/ml Monitor platelets by anticoagulation protocol: Yes   Plan:  Heparin IV bolus 2500 units x 1 Heparin IV continuous infusion 1200 units/hr Check heparin level in 8hrs Daily heparin level and CBC Monitor for signs/symptoms of bleeding  Peggyann Juba, PharmD, BCPS Pharmacy: [phone] 09/05/2020,7:47 PM

## 2020-09-05 NOTE — ED Provider Notes (Signed)
[facility] DEPT Provider Note   CSN: [id number] Arrival date & time: 09/05/20  1443     History Chief Complaint  Patient presents with  . Shortness of Breath    Elizabeth Melton is a 79 y.o. female.  HPI  Patient presents in respiratory distress prelunch arrives via EMS, there are some limits to her ability to provide her details secondary to ongoing respiratory difficulty, level 5 caveat. Per EMS, the patient has had worsening dyspnea for about 3 days. She has a history of interstitial lung disease, chronic respiratory failure. Now, for the past 3 days the patient has had worsening symptoms, and on their arrival patient was found to be hypoxic, 70%. She improved with nonrebreather mask at 15 L, but remained tachypneic in route.   Past Medical History:  Diagnosis Date  . Diabetes mellitus without complication ([place])   . Hyperlipidemia   . Hypothyroidism   . Thrombocytopenia [facility])     Patient Active Problem List   Diagnosis Date Noted  . ILD (interstitial lung disease) ([place]) 10/23/2019  . Chronic respiratory failure with hypoxia ([place]) 10/23/2019  . Essential (primary) hypertension 09/18/2018  . PAD (peripheral artery disease) ([place]) 09/18/2018  . Tobacco abuse 09/18/2018  . Aortic regurgitation 09/18/2018  . Hypothyroidism   . Hyperlipidemia   . Diabetes mellitus without complication ([place])   . Lower extremity pain 09/24/2012    Past Surgical History:  Procedure Laterality Date  . TUBAL LIGATION       OB History   No obstetric history on file.     Family History  Problem Relation Age of Onset  . Diabetes Mother   . Kidney disease Mother   . Hypertension Mother   . Other Father        tuberculosis  . Hypertension Sister   . Hyperlipidemia Sister     Social History   Tobacco Use  . Smoking status: Current Every Day Smoker    Packs/day: 0.75    Years: 60.00    Pack years: 45.00    Types: Cigarettes  . Smokeless  tobacco: Former Systems developer    Quit date: 09/24/1962  . Tobacco comment: Planning to start patches 08/2018  Vaping Use  . Vaping Use: Never used  Substance Use Topics  . Alcohol use: Yes  . Drug use: No    Home Medications Prior to Admission medications   Medication Sig Start Date End Date Taking? Authorizing Provider  acetaminophen (TYLENOL) 500 MG tablet Take 500 mg by mouth every 6 (six) hours as needed.    [provider]  b complex vitamins tablet Take 1 tablet by mouth daily.    [provider]  Calcium Carb-Cholecalciferol (CALCIUM + D3) 600-200 MG-UNIT TABS Take 2 tablets by mouth daily.     [provider]  cholecalciferol (VITAMIN D3) 25 MCG (1000 UT) tablet Take 1,000 Units by mouth daily.    [provider]  glucose blood test strip 1 each by Other route as needed. Use as instructed    [provider]  levothyroxine (SYNTHROID, LEVOTHROID) 75 MCG tablet Take 75 mcg by mouth daily.    [provider]  lovastatin (MEVACOR) 40 MG tablet Take 40 mg by mouth at bedtime.    [provider]  Omega-3 1000 MG CAPS Take by mouth.    [provider]  SMART SENSE THIN LANCETS 26G MISC by Does not apply route.    [provider]  Tiotropium Bromide Monohydrate (SPIRIVA  RESPIMAT) 2.5 MCG/ACT AERS Inhale 2 puffs into the lungs daily. 02/25/20   Brand Males, MD    Allergies    Patient has no known allergies.  Review of Systems   Review of Systems  Unable to perform ROS: Acuity of condition    Physical Exam Updated Vital Signs BP (!) 166/81 (BP Location: Left Arm)   Pulse 88   Temp 97.7 F (36.5 C) (Oral)   Resp (!) 24   SpO2 99%   Physical Exam Vitals and nursing note reviewed.  Constitutional:      Appearance: She is well-developed. She is ill-appearing.  HENT:     Head: Normocephalic and atraumatic.  Eyes:     Conjunctiva/sclera: Conjunctivae normal.  Cardiovascular:     Rate and Rhythm:  Normal rate and regular rhythm.  Pulmonary:     Effort: Tachypnea, accessory muscle usage and respiratory distress present.  Abdominal:     General: There is no distension.  Skin:    General: Skin is warm and dry.  Neurological:     Mental Status: She is alert and oriented to person, place, and time.     Cranial Nerves: No cranial nerve deficit.      ED Results / Procedures / Treatments   Labs (all labs ordered are listed, but only abnormal results are displayed) Labs Reviewed  LACTIC ACID, PLASMA - Abnormal; Notable for the following components:      Result Value   Lactic Acid, Venous 3.4 (*)    All other components within normal limits  LACTATE DEHYDROGENASE - Abnormal; Notable for the following components:   LDH 279 (*)    All other components within normal limits  FERRITIN - Abnormal; Notable for the following components:   Ferritin 537 (*)    All other components within normal limits  C-REACTIVE PROTEIN - Abnormal; Notable for the following components:   CRP 10.4 (*)    All other components within normal limits  COMPREHENSIVE METABOLIC PANEL - Abnormal; Notable for the following components:   CO2 21 (*)    Glucose, Bld 171 (*)    BUN 26 (*)    Creatinine, Ser 1.05 (*)    Total Protein 8.2 (*)    AST 69 (*)    ALT 67 (*)    GFR calc non Af Amer 51 (*)    GFR calc Af Amer 59 (*)    All other components within normal limits  CBC WITH DIFFERENTIAL/PLATELET - Abnormal; Notable for the following components:   WBC 14.6 (*)    Hemoglobin 16.1 (*)    HCT 49.1 (*)    MCV 101.4 (*)    Platelets 102 (*)    Neutro Abs 11.7 (*)    All other components within normal limits  BRAIN NATRIURETIC PEPTIDE - Abnormal; Notable for the following components:   B Natriuretic Peptide 850.0 (*)    All other components within normal limits  D-DIMER, QUANTITATIVE (NOT AT [facility]) - Abnormal; Notable for the following components:   D-Dimer, Quant >20.00 (*)    All other components within  normal limits  PROTIME-INR - Abnormal; Notable for the following components:   Prothrombin Time 15.9 (*)    INR 1.3 (*)    All other components within normal limits  SARS CORONAVIRUS 2 BY RT PCR (HOSPITAL ORDER, [place] [facility])  CULTURE, BLOOD (ROUTINE X 2)  CULTURE, BLOOD (ROUTINE X 2)  MRSA PCR SCREENING  LACTIC ACID, PLASMA  PROCALCITONIN  TRIGLYCERIDES  FIBRINOGEN  APTT  BASIC METABOLIC PANEL  CBC  HEPARIN LEVEL (UNFRACTIONATED)    EKG EKG Interpretation  Date/Time:  Sunday September 05 2020 15:10:10 EDT Ventricular Rate:  91 PR Interval:    QRS Duration: 104 QT Interval:  422 QTC Calculation: 520 R Axis:   72 Text Interpretation: Sinus rhythm T wave abnormality Artifact Baseline wander Abnormal ekg Confirmed by Carmin Muskrat [id number]) on 09/05/2020 5:33:24 PM   Radiology DG Chest Port 1 View  Result Date: 09/05/2020 CLINICAL DATA:  Worsening shortness of breath for 3 days. EXAM: PORTABLE CHEST 1 VIEW COMPARISON:  Chest CT 10/20/2019 FINDINGS: Cardiomegaly is similar to prior CT allowing for differences in modality. Unchanged mediastinal contours. There is aortic atherosclerosis. Reticular opacities throughout both lungs corresponding to interstitial lung disease on prior CT. No confluent consolidation. No pleural effusion or pneumothorax. Stable osseous structures. IMPRESSION: Cardiomegaly. Chronic reticular opacities throughout both lungs corresponding to interstitial lung disease and emphysema on prior CT. No superimposed acute abnormality. Aortic Atherosclerosis (ICD10-I70.0). Electronically Signed   By: Keith Rake M.D.   On: 09/05/2020 16:26    Procedures Procedures (including critical care time)  Medications Ordered in ED Medications  heparin ADULT infusion 100 units/mL (25000 units/221m sodium chloride 0.45%) (1,200 Units/hr Intravenous New Bag/Given 09/05/20 2035)  sodium chloride flush (NS) 0.9 % injection 3 mL (3 mLs Intravenous  Given 09/05/20 2152)  acetaminophen (TYLENOL) tablet 650 mg (has no administration in time range)    Or  acetaminophen (TYLENOL) suppository 650 mg (has no administration in time range)  oxyCODONE (Oxy IR/ROXICODONE) immediate release tablet 5 mg (5 mg Oral Given 09/05/20 2148)  senna-docusate (Senokot-S) tablet 1 tablet (has no administration in time range)  bisacodyl (DULCOLAX) suppository 10 mg (has no administration in time range)  albuterol (PROVENTIL) (2.5 MG/3ML) 0.083% nebulizer solution 2.5 mg (has no administration in time range)  nicotine (NICODERM CQ - dosed in mg/24 hours) patch 21 mg (21 mg Transdermal Not Given 09/05/20 2152)  pravastatin (PRAVACHOL) tablet 40 mg (has no administration in time range)  levothyroxine (SYNTHROID) tablet 75 mcg (has no administration in time range)  umeclidinium bromide (INCRUSE ELLIPTA) 62.5 MCG/INH 1 puff (has no administration in time range)  Chlorhexidine Gluconate Cloth 2 % PADS 6 each (6 each Topical Given 09/05/20 2152)  chlorhexidine (PERIDEX) 0.12 % solution 15 mL (15 mLs Mouth Rinse Given 09/05/20 2149)  MEDLINE mouth rinse (has no administration in time range)  HYDROmorphone (DILAUDID) injection 0.5 mg (has no administration in time range)  iohexol (OMNIPAQUE) 350 MG/ML injection 100 mL (100 mLs Intravenous Contrast Given 09/05/20 1922)  heparin bolus via infusion 2,500 Units (2,500 Units Intravenous Bolus from Bag 09/05/20 2035)    ED Course  I have reviewed the triage vital signs and the nursing notes.  Pertinent labs & imaging results that were available during my care of the patient were reviewed by me and considered in my medical decision making (see chart for details).    6:52 PM On repeat exam patient is awake and alert.  She continues to require supplemental oxygen. I reviewed the patient's initial labs which are notable for substantially elevated D-dimer, though negative coronavirus test.  She is now accompanied by a daughter, and  together we discussed the patient's history again, including what she now notes is a history of prior thrombosis in her right groin. She states that she is not currently taking blood thinning medication. With elevated D-dimer, ongoing need for respiratory care, patient will require admission. CT  scan has been ordered, is pending currently.  Update:, CT scan discussed with radiology, and subsequently with the patient, as well as her daughter. Patient found to have bilateral pulmonary embolism, right heart strain. However, on repeat exam patient has no hypotension, and remains with nasal cannula for oxygen supplementation at 100%.  Elderly female with prior clot, not on blood thinners presents with dyspnea, is found to have bilateral pulmonary embolism, new oxygen requirement, requires admission for further monitoring, management. MDM Rules/Calculators/A&P MDM Number of Diagnoses or Management Options Acute saddle pulmonary embolism with acute cor pulmonale (HCC): new, needed workup   Amount and/or Complexity of Data Reviewed Clinical lab tests: reviewed Tests in the radiology section of CPT: reviewed Tests in the medicine section of CPT: reviewed Discussion of test results with the performing providers: yes Decide to obtain previous medical records or to obtain history from someone other than the patient: yes Obtain history from someone other than the patient: yes Review and summarize past medical records: yes Discuss the patient with other providers: yes Independent visualization of images, tracings, or specimens: yes  Risk of Complications, Morbidity, and/or Mortality Presenting problems: high Diagnostic procedures: high Management options: high  Critical Care Total time providing critical care: 30-74 minutes (40)  Patient Progress Patient progress: stable  Final Clinical Impression(s) / ED Diagnoses Final diagnoses:  Acute saddle pulmonary embolism with acute cor pulmonale  ([place])     Carmin Muskrat, MD 09/05/20 2258

## 2020-09-05 NOTE — ED Triage Notes (Signed)
BIBA Per EMS: Pt coming from home complain of shortness of breath worsening X3 days  satting 78% when EMS arrives and in respiratory distress. Pt placed on nonrebreather at 15L  24 RR 189/86 100% on nonrebreather  93 HR 87 CBG 22 L hand

## 2020-09-05 NOTE — ED Notes (Signed)
Date and time results received: 09/05/20 5:10 PM  (use smartphrase ".now" to insert current time)  Test: Lactic  Critical Value: 3.4  Name of Provider Notified: Abby and [place], RN  Orders Received? Or Actions Taken?: Actions Taken: Chiropodist and EDP

## 2020-09-06 ENCOUNTER — Inpatient Hospital Stay (HOSPITAL_COMMUNITY): Payer: Medicare HMO

## 2020-09-06 DIAGNOSIS — I2692 Saddle embolus of pulmonary artery without acute cor pulmonale: Secondary | ICD-10-CM

## 2020-09-06 DIAGNOSIS — I2699 Other pulmonary embolism without acute cor pulmonale: Secondary | ICD-10-CM

## 2020-09-06 DIAGNOSIS — J9601 Acute respiratory failure with hypoxia: Secondary | ICD-10-CM | POA: Diagnosis present

## 2020-09-06 DIAGNOSIS — I351 Nonrheumatic aortic (valve) insufficiency: Secondary | ICD-10-CM

## 2020-09-06 DIAGNOSIS — I340 Nonrheumatic mitral (valve) insufficiency: Secondary | ICD-10-CM

## 2020-09-06 HISTORY — DX: Acute respiratory failure with hypoxia: J96.01

## 2020-09-06 LAB — BASIC METABOLIC PANEL
Anion gap: 11 (ref 5–15)
BUN: 23 mg/dL (ref 8–23)
CO2: 22 mmol/L (ref 22–32)
Calcium: 8.3 mg/dL — ABNORMAL LOW (ref 8.9–10.3)
Chloride: 105 mmol/L (ref 98–111)
Creatinine, Ser: 1.03 mg/dL — ABNORMAL HIGH (ref 0.44–1.00)
GFR calc Af Amer: >60 mL/min (ref >60)
GFR calc non Af Amer: 52 mL/min — ABNORMAL LOW (ref >60)
Glucose, Bld: 170 mg/dL — ABNORMAL HIGH (ref 70–99)
Potassium: 4.4 mmol/L (ref 3.5–5.1)
Sodium: 138 mmol/L (ref 135–145)

## 2020-09-06 LAB — ECHOCARDIOGRAM COMPLETE
AR max vel: 1.42 cm2
AV Area VTI: 1.54 cm2
AV Area mean vel: 1.42 cm2
AV Mean grad: 8 mmHg
AV Peak grad: 16.2 mmHg
Ao pk vel: 2.01 m/s
Area-P 1/2: 3.21 cm2
Height: 65 in
MV M vel: 5.19 m/s
MV Peak grad: 107.7 mmHg
P 1/2 time: 443 msec
S' Lateral: 3.2 cm
Weight: 2786.61 oz

## 2020-09-06 LAB — CBC
HCT: 40.6 % (ref 36.0–46.0)
Hemoglobin: 13.2 g/dL (ref 12.0–15.0)
MCH: 33.5 pg (ref 26.0–34.0)
MCHC: 32.5 g/dL (ref 30.0–36.0)
MCV: 103 fL — ABNORMAL HIGH (ref 80.0–100.0)
Platelets: 103 10*3/uL — ABNORMAL LOW (ref 150–400)
RBC: 3.94 MIL/uL (ref 3.87–5.11)
RDW: 14.6 % (ref 11.5–15.5)
WBC: 16 10*3/uL — ABNORMAL HIGH (ref 4.0–10.5)
nRBC: 0.3 % — ABNORMAL HIGH (ref 0.0–0.2)

## 2020-09-06 LAB — HEPARIN LEVEL (UNFRACTIONATED)
Heparin Unfractionated: 0.46 IU/mL (ref 0.30–0.70)
Heparin Unfractionated: 0.62 IU/mL (ref 0.30–0.70)

## 2020-09-06 MED ORDER — LABETALOL HCL 5 MG/ML IV SOLN
10.0000 mg | Freq: Once | INTRAVENOUS | Status: AC
  Administered 2020-09-06: 10 mg via INTRAVENOUS
  Filled 2020-09-06: qty 4

## 2020-09-06 NOTE — Progress Notes (Signed)
ANTICOAGULATION CONSULT NOTE  Pharmacy Consult for Heparin Indication: pulmonary embolus  No Known Allergies  Patient Measurements: Height: _0  (165.1 cm) Weight: 79 kg (174 lb 2.6 oz) IBW/kg (Calculated) : 57  Estimated height: 66 inches Heparin Dosing Weight:   Vital Signs: Temp: 98.9 F (37.2 C) (09/27 0821) Temp Source: Oral (09/27 0821) BP: 135/59 (09/27 0816) Pulse Rate: 72 (09/27 0816)  Labs: Recent Labs    09/05/20 1454 09/05/20 2005 09/06/20 0301 09/06/20 1144  HGB 16.1*  --  13.2  --   HCT 49.1*  --  40.6  --   PLT 102*  --  103*  --   APTT  --  32  --   --   LABPROT  --  15.9*  --   --   INR  --  1.3*  --   --   HEPARINUNFRC  --   --  0.46 0.62  CREATININE 1.05*  --  1.03*  --     Estimated Creatinine Clearance: 46.8 mL/min (A) (by C-G formula based on SCr of 1.03 mg/dL (H)).  Assessment: 79 yo female with interstitial lung disease presents with shortness of breath x 3 days.  CTa shows large PE.  Pharmacy consulted to dose IV heparin.  No PTA anticoagulant use reported. 09/06/2020  Confirmatory heparin level remains therapeutic at 0.62 on heparin at 1200 units/hr PLTC remains low as PTA w/ hx thrombocytopenia HG 16.1> 13.2, ? If dehydrated on admission. No bleeding reported. Pt refused thrombolytics for PE w/ R heart strain  Goal of Therapy:  Heparin level 0.3-0.7 units/ml Monitor platelets by anticoagulation protocol: Yes   Plan:   Continue heparin IV continuous infusion 1200 units/hr  Daily heparin level and CBC  Monitor for signs/symptoms of bleeding  F/u for transition to [employer], Pharm.D 09/06/2020 12:08 PM

## 2020-09-06 NOTE — Progress Notes (Signed)
79 year old with DM, hypothyroidism, hyperlipidemia, chronic thrombocytopenia came in for chest discomfort , was found to have submassive PE.  She was admitted earlier by Dr. Benny Lennert please see her note for detailed H&P. Patient seen and examined this morning she is alert comfortable on 5 L of nasal cannula oxygen. On exam she has diminished air entry at bases. CT angiogram showed saddle embolus with right heart strain.  She was started on IV heparin and PCCM consulted for possible thrombolysis. Patient at this time does not want thrombolysis at this time wants to continue with IV heparin.   Hosie Poisson, ,MD

## 2020-09-06 NOTE — Progress Notes (Signed)
ANTICOAGULATION CONSULT NOTE -  Consult  Pharmacy Consult for Heparin Indication: pulmonary embolus  No Known Allergies  Patient Measurements: Height: _0  (165.1 cm) Weight: 79 kg (174 lb 2.6 oz) IBW/kg (Calculated) : 57  Estimated height: 66 inches Heparin Dosing Weight:   Vital Signs: Temp: 97.6 F (36.4 C) (09/26 2057) Temp Source: Oral (09/26 2057) BP: 119/56 (09/27 0200) Pulse Rate: 67 (09/27 0200)  Labs: Recent Labs    09/05/20 1454 09/05/20 2005 09/06/20 0301  HGB 16.1*  --  13.2  HCT 49.1*  --  40.6  PLT 102*  --  103*  APTT  --  32  --   LABPROT  --  15.9*  --   INR  --  1.3*  --   HEPARINUNFRC  --   --  0.46  CREATININE 1.05*  --  1.03*    Estimated Creatinine Clearance: 46.8 mL/min (A) (by C-G formula based on SCr of 1.03 mg/dL (H)).   Medical History: Past Medical History:  Diagnosis Date  . Diabetes mellitus without complication ([place])   . Hyperlipidemia   . Hypothyroidism   . Thrombocytopenia (HCC)     Medications:  Scheduled:  Infusions:  PRN:   Assessment: 79 yo female with interstitial lung disease presents with shortness of breath x 3 days.  CTa shows large PE.  Pharmacy consulted to dose IV heparin.  No PTA anticoagulant use reported.   Baseline PT 15.0, INR 1.3, PT/INR pending  H/H WNL , Plts low (chronic per MD note)  SCr 1.03  D-dimer > 20  No charted line or bleeding issues   Goal of Therapy:  Heparin level 0.3-0.7 units/ml Monitor platelets by anticoagulation protocol: Yes   Plan:   Continue heparin IV continuous infusion 1200 units/hr  Obtain confirmatory heparin level in 8hrs  Daily heparin level and CBC  Monitor for signs/symptoms of bleeding   Royetta Asal, PharmD, BCPS 09/06/2020 5:32 AM

## 2020-09-06 NOTE — H&P (Signed)
Elizabeth Melton is an 79 y.o. female.   Chief Complaint: Respiratory distress, chest and back pain.  HPI: The patient is a 79 yr old woman who [facility] with the above complaints. She has a past medical history significant for prior PE, ILD, chronic respiratory failure for which she is on 3L O2 at home. En route to the ED she was placed on 15L O2 by EMS, but remained tachypneic.   The patient also has DM II, hypothyroidism, hyperlipidemia, and thrombocytopenia. Also of interest is the fact that the patient was recently tested for autoimmune disease. The assay demonstrated a positive ANA with a cytoplasmic pattern. No further detail is given. It is also positive for DS DNA.  In the ED the patient was found to require 5L O2 to maintain saturations in the 90's. Although she was found to be negative for COVID-19, she was found to have LDH of 279, Ferritin of 537, Lactic acid of 3.4, CRP of 10.4, DDimer of greater than 20 and WBC of 14.6.  CTA of the chest demonstrated saddle embolus with right heart strain. IR and PCCM have been consulted. The patient has been placed on a heparin drip.   [facility] were consulted to admit the patient for further evaluation and care.  The patient denies fever or chills. She does complain of shortness of breath, back pain and chest pain. No nausea or vomiting. She had a dry cough. Increased chest pain with deep respiration or cough. No neurological changes. No rashes, sores, or lesions.  Past Medical History:  Diagnosis Date  . Diabetes mellitus without complication ([place])   . Hyperlipidemia   . Hypothyroidism   . Thrombocytopenia ([place])     Past Surgical History:  Procedure Laterality Date  . TUBAL LIGATION      Family History  Problem Relation Age of Onset  . Diabetes Mother   . Kidney disease Mother   . Hypertension Mother   . Other Father        tuberculosis  . Hypertension Sister   . Hyperlipidemia Sister    Social  History:  reports that she has been smoking cigarettes. She has a 45.00 pack-year smoking history. She quit smokeless tobacco use about 57 years ago. She reports current alcohol use. She reports that she does not use drugs. Medications Prior to Admission  Medication Sig Dispense Refill  . acetaminophen (TYLENOL) 500 MG tablet Take 500 mg by mouth daily as needed for headache (pain).     Marland Kitchen b complex vitamins tablet Take 1 tablet by mouth daily.    . Calcium Carb-Cholecalciferol (CALCIUM + D3) 600-200 MG-UNIT TABS Take 2 tablets by mouth daily.     . cholecalciferol (VITAMIN D3) 25 MCG (1000 UT) tablet Take 1,000 Units by mouth daily.    Marland Kitchen levothyroxine (SYNTHROID, LEVOTHROID) 75 MCG tablet Take 75 mcg by mouth daily before breakfast.     . lovastatin (MEVACOR) 40 MG tablet Take 40 mg by mouth at bedtime.    . nicotine (NICODERM CQ - DOSED IN MG/24 HOURS) 21 mg/24hr patch Place 21 mg onto the skin daily as needed (smoking cessation).    . Omega-3 Fatty Acids (FISH OIL PO) Take 1 capsule by mouth daily.    Marland Kitchen glucose blood test strip 1 each by Other route as needed. Use as instructed    . SMART SENSE THIN LANCETS 26G MISC by Does not apply route.    . Tiotropium Bromide Monohydrate (SPIRIVA RESPIMAT) 2.5  MCG/ACT AERS Inhale 2 puffs into the lungs daily. (Patient not taking: Reported on 09/05/2020) 8 g 0    Allergies: No Known Allergies  Pertinent items noted in HPI and remainder of comprehensive ROS otherwise negative.   General appearance: alert, cooperative and moderate distress Head: Normocephalic, without obvious abnormality, atraumatic Eyes: conjunctivae/corneas clear. PERRL, EOM's intact. Fundi benign. Throat: lips, mucosa, and tongue normal; teeth and gums normal Neck: no adenopathy, no carotid bruit, no JVD, supple, symmetrical, trachea midline and thyroid not enlarged, symmetric, no tenderness/mass/nodules Resp: Positive for increased work of breathing. No wheezes, rales, or rhonchi. No  tactile fremitus. Chest wall: no tenderness Cardio: regular rate and rhythm, S1, S2 normal, no murmur, click, rub or gallop GI: soft, non-tender; bowel sounds normal; no masses,  no organomegaly Extremities: extremities normal, atraumatic, no cyanosis or edema Pulses: 2+ and symmetric Skin: Skin color, texture, turgor normal. No rashes or lesions Lymph nodes: Cervical, supraclavicular, and axillary nodes normal. Neurologic: Alert and oriented X 3, normal strength and tone. Normal symmetric reflexes. Normal coordination and gait  Results for orders placed or performed during the hospital encounter of 09/05/20 (from the past 48 hour(s))  SARS Coronavirus 2 by RT PCR (hospital order, performed in [facility] hospital lab) Nasopharyngeal Nasopharyngeal Swab     Status: None   Collection Time: 09/05/20  2:54 PM   Specimen: Nasopharyngeal Swab  Result Value Ref Range   SARS Coronavirus 2 NEGATIVE NEGATIVE    Comment: (NOTE) SARS-CoV-2 target nucleic acids are NOT DETECTED.  The SARS-CoV-2 RNA is generally detectable in upper and lower respiratory specimens during the acute phase of infection. The lowest concentration of SARS-CoV-2 viral copies this assay can detect is 250 copies / mL. A negative result does not preclude SARS-CoV-2 infection and should not be used as the sole basis for treatment or other patient management decisions.  A negative result may occur with improper specimen collection / handling, submission of specimen other than nasopharyngeal swab, presence of viral mutation(s) within the areas targeted by this assay, and inadequate number of viral copies (<250 copies / mL). A negative result must be combined with clinical observations, patient history, and epidemiological information.  Fact Sheet for Patients:   StrictlyIdeas.no  Fact Sheet for Healthcare Providers: BankingDealers.co.za  This test is not yet approved or   cleared by the [place] FDA and has been authorized for detection and/or diagnosis of SARS-CoV-2 by FDA under an Emergency Use Authorization (EUA).  This EUA will remain in effect (meaning this test can be used) for the duration of the COVID-19 declaration under Section 564(b)(1) of the Act, 21 U.S.C. section 360bbb-3(b)(1), unless the authorization is terminated or revoked sooner.  Performed at [facility], [place] [address], [place], [place] [address]   Lactic acid, plasma     Status: Abnormal   Collection Time: 09/05/20  2:54 PM  Result Value Ref Range   Lactic Acid, Venous 3.4 (HH) 0.5 - 1.9 mmol/L    Comment: CRITICAL RESULT CALLED TO, READ BACK BY AND VERIFIED WITH: S.CLAP,RN [id number] _0  BY V.WILKINS Performed at [facility], [place] [address] [place], [place] [address]   Blood Culture (routine x 2)     Status: None (Preliminary result)   Collection Time: 09/05/20  2:54 PM   Specimen: BLOOD  Result Value Ref Range   Specimen Description      BLOOD RIGHT ANTECUBITAL Performed at [facility], [place] [address] [place], [place] [address]  Special Requests      BOTTLES DRAWN AEROBIC AND ANAEROBIC Blood Culture adequate volume Performed at [place] [address], [place], [place] [address]    Culture PENDING    Report Status PENDING   Procalcitonin     Status: None   Collection Time: 09/05/20  2:54 PM  Result Value Ref Range   Procalcitonin 0.16 ng/mL    Comment:        Interpretation: PCT (Procalcitonin) <= 0.5 ng/mL: Systemic infection (sepsis) is not likely. Local bacterial infection is possible. (NOTE)       Sepsis PCT Algorithm           Lower Respiratory Tract                                      Infection PCT Algorithm    ----------------------------     ----------------------------         PCT < 0.25 ng/mL                PCT < 0.10 ng/mL          Strongly encourage             Strongly  discourage   discontinuation of antibiotics    initiation of antibiotics    ----------------------------     -----------------------------       PCT 0.25 - 0.50 ng/mL            PCT 0.10 - 0.25 ng/mL               OR       >80% decrease in PCT            Discourage initiation of                                            antibiotics      Encourage discontinuation           of antibiotics    ----------------------------     -----------------------------         PCT >= 0.50 ng/mL              PCT 0.26 - 0.50 ng/mL               AND        <80% decrease in PCT             Encourage initiation of                                             antibiotics       Encourage continuation           of antibiotics    ----------------------------     -----------------------------        PCT >= 0.50 ng/mL                  PCT > 0.50 ng/mL               AND         increase in PCT  Strongly encourage                                      initiation of antibiotics    Strongly encourage escalation           of antibiotics                                     -----------------------------                                           PCT <= 0.25 ng/mL                                                 OR                                        > 80% decrease in PCT                                      Discontinue / Do not initiate                                             antibiotics  Performed at [place] [address], [place], [place] [address]   Lactate dehydrogenase     Status: Abnormal   Collection Time: 09/05/20  2:54 PM  Result Value Ref Range   LDH 279 (H) 98 - 192 U/L    Comment: Performed at [facility], [place] [address] [place], [place] [address]  Ferritin     Status: Abnormal   Collection Time: 09/05/20  2:54 PM  Result Value Ref Range   Ferritin 537 (H) 11 - 307 ng/mL    Comment: Performed at [facility], [place] [address], [place], [place] [address]  Triglycerides     Status: None   Collection Time: 09/05/20  2:54 PM  Result Value Ref Range   Triglycerides 75 <150 mg/dL    Comment: Performed at [facility], [place] [address] [place], [place] [address]  C-reactive protein     Status: Abnormal   Collection Time: 09/05/20  2:54 PM  Result Value Ref Range   CRP 10.4 (H) <1.0 mg/dL    Comment: Performed at [facility], [place] [address], [place], [place] [address]  Comprehensive metabolic panel     Status: Abnormal   Collection Time: 09/05/20  2:54 PM  Result Value Ref Range   Sodium 139 135 - 145 mmol/L   Potassium 4.2 3.5 - 5.1 mmol/L   Chloride 106 98 - 111 mmol/L   CO2 21 (L) 22 - 32 mmol/L   Glucose, Bld 171 (H) 70 - 99 mg/dL    Comment: Glucose reference range applies only to  samples taken after fasting for at least 8 hours.   BUN 26 (H) 8 - 23 mg/dL   Creatinine, Ser 1.05 (H) 0.44 - 1.00 mg/dL   Calcium 8.9 8.9 - 10.3 mg/dL   Total Protein 8.2 (H) 6.5 - 8.1 g/dL   Albumin 3.6 3.5 - 5.0 g/dL   AST 69 (H) 15 - 41 U/L   ALT 67 (H) 0 - 44 U/L   Alkaline Phosphatase 86 38 - 126 U/L   Total Bilirubin 0.9 0.3 - 1.2 mg/dL   GFR calc non Af Amer 51 (L) >60 mL/min   GFR calc Af Amer 59 (L) >60 mL/min   Anion gap 12 5 - 15    Comment: Performed at [facility], [place] [address], [place], [place] [address]  CBC WITH DIFFERENTIAL     Status: Abnormal   Collection Time: 09/05/20  2:54 PM  Result Value Ref Range   WBC 14.6 (H) 4.0 - 10.5 K/uL   RBC 4.84 3.87 - 5.11 MIL/uL   Hemoglobin 16.1 (H) 12.0 - 15.0 g/dL   HCT 49.1 (H) 36 - 46 %   MCV 101.4 (H) 80.0 - 100.0 fL   MCH 33.3 26.0 - 34.0 pg   MCHC 32.8 30.0 - 36.0 g/dL   RDW 15.2 11.5 - 15.5 %   Platelets 102 (L) 150 - 400 K/uL    Comment: PLATELET COUNT CONFIRMED BY SMEAR Immature Platelet Fraction may be clinically indicated, consider ordering this additional test [id number]    nRBC  0.1 0.0 - 0.2 %   Neutrophils Relative % 81 %   Neutro Abs 11.7 (H) 1.7 - 7.7 K/uL   Lymphocytes Relative 13 %   Lymphs Abs 1.9 0.7 - 4.0 K/uL   Monocytes Relative 6 %   Monocytes Absolute 0.9 0 - 1 K/uL   Eosinophils Relative 0 %   Eosinophils Absolute 0.0 0 - 0 K/uL   Basophils Relative 0 %   Basophils Absolute 0.0 0 - 0 K/uL   Immature Granulocytes 0 %   Abs Immature Granulocytes 0.06 0.00 - 0.07 K/uL    Comment: Performed at [facility], [place] [address], [place], [place] [address]  Brain natriuretic peptide     Status: Abnormal   Collection Time: 09/05/20  2:54 PM  Result Value Ref Range   B Natriuretic Peptide 850.0 (H) 0.0 - 100.0 pg/mL    Comment: Performed at [facility], [place] [address], [place], [place] [address]  D-dimer, quantitative (not at [facility])     Status: Abnormal   Collection Time: 09/05/20  3:36 PM  Result Value Ref Range   D-Dimer, Quant >20.00 (H) 0.00 - 0.50 ug/mL-FEU    Comment: (NOTE) At the manufacturer cut-off of 0.50 ug/mL FEU, this assay has been documented to exclude PE with a sensitivity and negative predictive value of 97 to 99%.  At this time, this assay has not been approved by the FDA to exclude DVT/VTE. Results should be correlated with clinical presentation. Performed at [facility], [place] [address], [place], [place] [address]   Fibrinogen     Status: None   Collection Time: 09/05/20  3:36 PM  Result Value Ref Range   Fibrinogen 405 210 - 475 mg/dL    Comment: Performed at [facility], [place] [address] [place], [place] [address]  Lactic acid, plasma     Status: None   Collection Time: 09/05/20  4:54 PM  Result Value  Ref Range   Lactic Acid, Venous 1.8 0.5 - 1.9 mmol/L    Comment: Performed at [facility], [place] [address] [place], [place] [address]  APTT     Status: None   Collection Time: 09/05/20  8:05 PM  Result Value Ref Range   aPTT 32 24  - 36 seconds    Comment: Performed at [facility], [place] [address] [place], [place] [address]  Protime-INR     Status: Abnormal   Collection Time: 09/05/20  8:05 PM  Result Value Ref Range   Prothrombin Time 15.9 (H) 11.4 - 15.2 seconds   INR 1.3 (H) 0.8 - 1.2    Comment: (NOTE) INR goal varies based on device and disease states. Performed at [facility], [place] [address], [place], [place] [address]   MRSA PCR Screening     Status: None   Collection Time: 09/05/20  9:34 PM   Specimen: Nasal Mucosa; Nasopharyngeal  Result Value Ref Range   MRSA by PCR NEGATIVE NEGATIVE    Comment:        The GeneXpert MRSA Assay (FDA approved for NASAL specimens only), is one component of a comprehensive MRSA colonization surveillance program. It is not intended to diagnose MRSA infection nor to guide or monitor treatment for MRSA infections. Performed at [facility], [place] [address] [place], [place] [address]    _0 @  Blood pressure (!) 114/53, pulse 76, temperature 97.6 F (36.4 C), temperature source Oral, resp. rate 18, height 5' 5" (1.651 m), weight 79 kg, SpO2 98 %.   Assessment/Plan Problem  Acute On Chronic Respiratory Failure With Hypoxia (Hcc)  Saddle Embolus of Pulmonary Artery ([facility])  Ild (Interstitial Lung Disease) ([facility])  Essential (Primary) Hypertension  Tobacco Abuse  Hypothyroidism  Hyperlipidemia  Diabetes mellitus without complication ([place])   Acute on chronic hypoxic respiratory failure/Interstitial lung disease: The patient is on 3L o2 chronically at home due to her interstitial lung disease. Tonight she is requiring 5L O2 to maintain saturations greater than 90%.   Saddle embolus with right heart strain: I have discussed the patient with PCCM and IR. They have been consulted and will see the patient in the morning. Although the patient gives a history of previous clot in her pelvis, she is on no  anticoagulation. The patient smokes. 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 I will check for antiphospholipid antibody syndrome. Her DDimer is greater than 20. However, she is negative for COVID-19. She is a smoker.  Hypertension: Patient currently has blood pressures that are normotensive to borderline low. He does not appears to be on antihypertensives at home.  Hypothyroidism: Continue synthroid as at home.  Hyperlipidemia: Continue pravacholla as at home.  DM II: The patient does not appear to be on medication for her diabetes at home. Glucoses will be followed with FSBS and SSI. She will be on a heart healthy/modified carbohydrate diet and a hypoglycemic protocol.  I have seen and examined this patient myself. I have spent 82 minutes in her evaluation and care. More than 50% of this was spent in coordination of care.  DVT Prophylaxis: heparin drip CODE STATUS; Full Code Family Communication: None available Disposition: The patient has been admitted as inpatient to a step down bed.  Status is: Inpatient  Remains inpatient appropriate because:Inpatient level of care appropriate due to severity of illness  Dispo: The patient is from: Home  Anticipated d/c is to: Home              Anticipated d/c date is: > 3 days              Patient currently is not medically stable to d/c.  Severity of Illness: The appropriate patient status for this patient is INPATIENT. Inpatient status is judged to be reasonable and necessary in order to provide the required intensity of service to ensure the patient's safety. The patient's presenting symptoms, physical exam findings, and initial radiographic and laboratory data in the context of their chronic comorbidities is felt to place them at high risk for further clinical deterioration. Furthermore, it is not anticipated that the patient will be medically stable for discharge from the hospital within 2  midnights of admission. The following factors support the patient status of inpatient.   " The patient's presenting symptoms include Respiratory distress, chest pain. " The worrisome physical exam findings include pain with deep respirations or cough. " The initial radiographic and laboratory data are worrisome because of saddle embolus with right heart strain. " The chronic co-morbidities include prior DVT, possible autoimmune disease, interstitial lung disease.  * I certify that at the point of admission it is my clinical judgment that the patient will require inpatient hospital care spanning beyond 2 midnights from the point of admission due to high intensity of service, high risk for further deterioration and high frequency of surveillance required.*  Elizabeth Melton 09/06/2020, 12:07 AM

## 2020-09-06 NOTE — Progress Notes (Signed)
Echocardiogram 2D Echocardiogram has been performed.  Jennette Dubin 09/06/2020, 2:12 PM

## 2020-09-06 NOTE — Consult Note (Signed)
NAME:  Elizabeth Melton, MRN:  [id number], DOB:  [birth year], LOS: 1 ADMISSION DATE:  09/05/2020, CONSULTATION DATE:  09/06/2020 REFERRING MD:  Dr Karleen Hampshire, CHIEF COMPLAINT:  PE   Brief History   Patient came into the hospital with complaints of chest discomfort some back pain Found to have submassive PE  History of present illness   Came into the hospital worsening symptoms of chest discomfort Was requiring 5 L of oxygen when she got to the emergency department for saturations of 90%, usually on 3 L of oxygen at home D-dimer was elevated CTA shows saddle embolus with right heart strain Started on heparin Past Medical History   Past Medical History:  Diagnosis Date  . Diabetes mellitus without complication ([place])   . Hyperlipidemia   . Hypothyroidism   . Thrombocytopenia ([place])    Significant Hospital Events   She feels comfortable -States that she coughed up small amount of blood with phlegm  Consults:  Interventional radiology Pccm  Procedures:  None  Significant Diagnostic Tests:  CT chest IMPRESSION: 1. Positive for acute pulmonary embolus with large volume thrombus, including a thin saddle. Thromboembolic burden is large, particularly in the right lung. There is right heart strain with RV to LV ratio of 1.59, right heart dilatation, and reflux of contrast into the hepatic veins and IVC consistent with elevated right heart pressures. 2. Emphysema with chronic interstitial lung disease. No evidence of pulmonary infarct. No definite acute pulmonary parenchymal findings, slight motion limitations. 3. Incidental cholelithiasis. Micro Data:  None  Antimicrobials:  None  Interim history/subjective:  Feels comfortable at present, still feels short of breath with any activity  Objective   Blood pressure 131/73, pulse 74, temperature 98.9 F (37.2 C), temperature source Oral, resp. rate (!) 21, height _0  (1.651 m), weight 79 kg, SpO2 94 %.        Intake/Output  Summary (Last 24 hours) at 09/06/2020 2409 Last data filed at 09/06/2020 7353 Gross per 24 hour  Intake 124.59 ml  Output 200 ml  Net -75.41 ml   Filed Weights   09/05/20 2025  Weight: 79 kg    Examination: General: Elderly lady, comfortable, does not appear to be in respiratory distress at rest HENT: Moist oral mucosa Lungs: Decreased air entry bilaterally Cardiovascular: S1-S2 appreciated Abdomen: Bowel sounds appreciated Extremities: No clubbing, no edema Neuro: Alert and oriented x3 GU:   Resolved Hospital Problem list     Assessment & Plan:   Saddle pulmonary embolism with high clot burden, right heart strain PESI score of 108-intermediate risk -Blood pressure is stable 130/73, no hypotension documented -Respiratory rate less than 25 -Heart rate about 80 Discussed thrombolytics with patient -She absolutely does not want to take even a low risk with bleeding or a CVA -She feels a 0-2% risk of a CVA is too high to take -aware its going to take longer to recover with ongoing care versus use of thrombolytics  We will continue heparin at present  Acute on chronic respiratory failure with hypoxia -Usually requires about 3 L -Maintain oxygen supplementation to keep saturations greater than 88%  History of pulmonary hypertension  Active smoker -Smoking cessation counseling  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 -Continue support  Hypertension Diabetes -Continue home medications  Best practice:  Diet: regular Pain/Anxiety/Delirium protocol (if indicated): hydrocodone VAP protocol (if indicated): n/a DVT prophylaxis: on full anticoagulation GI prophylaxis:  Glucose control:  Mobility: bed rest Code Status: full code Family Communication: patient fully aware  and understands ongoing health issues Disposition: stepdown  Labs   CBC: Recent Labs  Lab 09/05/20 1454 09/06/20 0301  WBC 14.6* 16.0*  NEUTROABS 11.7*  --   HGB 16.1* 13.2  HCT  49.1* 40.6  MCV 101.4* 103.0*  PLT 102* 103*    Basic Metabolic Panel: Recent Labs  Lab 09/05/20 1454 09/06/20 0301  NA 139 138  K 4.2 4.4  CL 106 105  CO2 21* 22  GLUCOSE 171* 170*  BUN 26* 23  CREATININE 1.05* 1.03*  CALCIUM 8.9 8.3*   GFR: Estimated Creatinine Clearance: 46.8 mL/min (A) (by C-G formula based on SCr of 1.03 mg/dL (H)). Recent Labs  Lab 09/05/20 1454 09/05/20 1654 09/06/20 0301  PROCALCITON 0.16  --   --   WBC 14.6*  --  16.0*  LATICACIDVEN 3.4* 1.8  --     Liver Function Tests: Recent Labs  Lab 09/05/20 1454  AST 69*  ALT 67*  ALKPHOS 86  BILITOT 0.9  PROT 8.2*  ALBUMIN 3.6   No results for input(s): LIPASE, AMYLASE in the last 168 hours. No results for input(s): AMMONIA in the last 168 hours.  ABG No results found for: PHART, PCO2ART, PO2ART, HCO3, TCO2, ACIDBASEDEF, O2SAT   Coagulation Profile: Recent Labs  Lab 09/05/20 2005  INR 1.3*    Cardiac Enzymes: No results for input(s): CKTOTAL, CKMB, CKMBINDEX, TROPONINI in the last 168 hours.  HbA1C: No results found for: HGBA1C  CBG: No results for input(s): GLUCAP in the last 168 hours.  Review of Systems:   Some chest discomfort  Past Medical History  She,  has a past medical history of Diabetes mellitus without complication ([place]), Hyperlipidemia, Hypothyroidism, and Thrombocytopenia ([place]).   Surgical History    Past Surgical History:  Procedure Laterality Date  . TUBAL LIGATION       Social History   reports that she has been smoking cigarettes. She has a 45.00 pack-year smoking history. She quit smokeless tobacco use about 57 years ago. She reports current alcohol use. She reports that she does not use drugs.   Family History   Her family history includes Diabetes in her mother; Hyperlipidemia in her sister; Hypertension in her mother and sister; Kidney disease in her mother; Other in her father.   Allergies No Known Allergies   Home Medications  Prior to  Admission medications   Medication Sig Start Date End Date Taking? Authorizing Provider  acetaminophen (TYLENOL) 500 MG tablet Take 500 mg by mouth daily as needed for headache (pain).    Yes [provider]  b complex vitamins tablet Take 1 tablet by mouth daily.   Yes [provider]  Calcium Carb-Cholecalciferol (CALCIUM + D3) 600-200 MG-UNIT TABS Take 2 tablets by mouth daily.    Yes [provider]  cholecalciferol (VITAMIN D3) 25 MCG (1000 UT) tablet Take 1,000 Units by mouth daily.   Yes [provider]  levothyroxine (SYNTHROID, LEVOTHROID) 75 MCG tablet Take 75 mcg by mouth daily before breakfast.    Yes [provider]  lovastatin (MEVACOR) 40 MG tablet Take 40 mg by mouth at bedtime.   Yes [provider]  nicotine (NICODERM CQ - DOSED IN MG/24 HOURS) 21 mg/24hr patch Place 21 mg onto the skin daily as needed (smoking cessation).   Yes [provider]  Omega-3 Fatty Acids (FISH OIL PO) Take 1 capsule by mouth daily.   Yes [provider]  glucose blood test strip 1 each by Other route  as needed. Use as instructed    [provider]  SMART SENSE THIN LANCETS 26G MISC by Does not apply route.    [provider]  Tiotropium Bromide Monohydrate (SPIRIVA RESPIMAT) 2.5 MCG/ACT AERS Inhale 2 puffs into the lungs daily. Patient not taking: Reported on 09/05/2020 02/25/20   Brand Males, MD    The patient is critically ill with multiple organ systems failure and requires high complexity decision making for assessment and support, frequent evaluation and titration of therapies, application of advanced monitoring technologies and extensive interpretation of multiple databases. Critical Care Time devoted to patient care services described in this note independent of APP/resident time (if applicable)  is 32 minutes.   Sherrilyn Rist MD [facility] Personal pager: [phone] If  unanswered, please page CCM On-call: [phone]

## 2020-09-06 NOTE — TOC Initial Note (Signed)
Transition of Care [facility]) - Initial/Assessment Note    Patient Details  Name: Elizabeth Melton MRN: [id number] Date of Birth: 1941/05/18  [facility] CM/SW Contact:    Leeroy Cha, RN Phone Number: 09/06/2020, 8:55 AM  Clinical Narrative:                 79 yr old woman who presents to [facility] ED with the above complaints. She has a past medical history significant for prior PE, ILD, chronic respiratory failure for which she is on 3L O2 at home. En route to the ED she was placed on 15L O2 by EMS, but remained tachypneic.   The patient also has DM II, hypothyroidism, hyperlipidemia, and thrombocytopenia. Also of interest is the fact that the patient was recently tested for autoimmune disease. The assay demonstrated a positive ANA with a cytoplasmic pattern. No further detail is given. It is also positive for DS DNA.  In the ED the patient was found to require 5L O2 to maintain saturations in the 90's. Although she was found to be negative for COVID-19, she was found to have LDH of 279, Ferritin of 537, Lactic acid of 3.4, CRP of 10.4, DDimer of greater than 20 and WBC of 14.6.  CTA of the chest demonstrated saddle embolus with right heart strain. IR and PCCM have been consulted. The patient has been placed on a heparin drip.  Plan home with husband and self care Following for progression Expected Discharge Plan: Home/Self Care Barriers to Discharge: Continued Medical Work up   Patient Goals and CMS Choice Patient states their goals for this hospitalization and ongoing recovery are:: to go home CMS Medicare.gov Compare Post Acute Care list provided to:: Patient Choice offered to / list presented to : Patient  Expected Discharge Plan and Services Expected Discharge Plan: Home/Self Care   Discharge Planning Services: CM Consult   Living arrangements for the past 2 months: Single Family Home                                      Prior Living  Arrangements/Services Living arrangements for the past 2 months: Single Family Home Lives with:: Spouse Patient language and need for interpreter reviewed:: Yes Do you feel safe going back to the place where you live?: Yes      Need for Family Participation in Patient Care: Yes (Comment) Care giver support system in place?: Yes (comment)   Criminal Activity/Legal Involvement Pertinent to Current Situation/Hospitalization: No - Comment as needed  Activities of Daily Living Home Assistive Devices/Equipment: Cane (specify quad or straight) ADL Screening (condition at time of admission) Patient's cognitive ability adequate to safely complete daily activities?: Yes Is the patient deaf or have difficulty hearing?: No Does the patient have difficulty seeing, even when wearing glasses/contacts?: No Does the patient have difficulty concentrating, remembering, or making decisions?: No Patient able to express need for assistance with ADLs?: Yes Does the patient have difficulty dressing or bathing?: No Independently performs ADLs?: Yes (appropriate for developmental age) Does the patient have difficulty walking or climbing stairs?: No Weakness of Legs: Both Weakness of Arms/Hands: None  Permission Sought/Granted                  Emotional Assessment Appearance:: Appears stated age Attitude/Demeanor/Rapport: Engaged Affect (typically observed): Calm Orientation: : Oriented to Place, Oriented to  Time, Oriented to Self, Oriented to  Situation Alcohol / Substance Use: Not Applicable Psych Involvement: No (comment)  Admission diagnosis:  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[place]) [I26.92] Patient Active Problem List   Diagnosis Date Noted  . Acute on chronic respiratory failure with hypoxia ([place]) 09/06/2020  . 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[place]) 09/05/2020  . ILD (interstitial lung disease) ([place]) 10/23/2019  . Chronic respiratory failure with hypoxia ([place]) 10/23/2019  . Essential  (primary) hypertension 09/18/2018  . PAD (peripheral artery disease) ([place]) 09/18/2018  . Tobacco abuse 09/18/2018  . Aortic regurgitation 09/18/2018  . Hypothyroidism   . Hyperlipidemia   . Diabetes mellitus without complication ([place])   . Lower extremity pain 09/24/2012   PCP:  Leighton Ruff, MD Pharmacy:   [id number] [place] [address] [place] [address]Phone: [phone]: [phone] [facility] [address] [place]Phone: [phone]: [phone]    Social Determinants of Health (SDOH) Interventions    Readmission Risk Interventions No flowsheet data found.

## 2020-09-07 ENCOUNTER — Inpatient Hospital Stay (HOSPITAL_COMMUNITY): Payer: Medicare HMO

## 2020-09-07 DIAGNOSIS — I2602 Saddle embolus of pulmonary artery with acute cor pulmonale: Secondary | ICD-10-CM

## 2020-09-07 DIAGNOSIS — R0602 Shortness of breath: Secondary | ICD-10-CM

## 2020-09-07 DIAGNOSIS — I2699 Other pulmonary embolism without acute cor pulmonale: Secondary | ICD-10-CM

## 2020-09-07 DIAGNOSIS — J9621 Acute and chronic respiratory failure with hypoxia: Secondary | ICD-10-CM

## 2020-09-07 DIAGNOSIS — I10 Essential (primary) hypertension: Secondary | ICD-10-CM

## 2020-09-07 LAB — CBC
HCT: 40.8 % (ref 36.0–46.0)
Hemoglobin: 13.3 g/dL (ref 12.0–15.0)
MCH: 33.6 pg (ref 26.0–34.0)
MCHC: 32.6 g/dL (ref 30.0–36.0)
MCV: 103 fL — ABNORMAL HIGH (ref 80.0–100.0)
Platelets: 116 10*3/uL — ABNORMAL LOW (ref 150–400)
RBC: 3.96 MIL/uL (ref 3.87–5.11)
RDW: 14.6 % (ref 11.5–15.5)
WBC: 13.4 10*3/uL — ABNORMAL HIGH (ref 4.0–10.5)
nRBC: 0.4 % — ABNORMAL HIGH (ref 0.0–0.2)

## 2020-09-07 LAB — HEPARIN LEVEL (UNFRACTIONATED): Heparin Unfractionated: 0.61 IU/mL (ref 0.30–0.70)

## 2020-09-07 MED ORDER — OXYCODONE-ACETAMINOPHEN 5-325 MG PO TABS
1.0000 | ORAL_TABLET | ORAL | Status: DC | PRN
  Administered 2020-09-07: 1 via ORAL
  Administered 2020-09-08: 2 via ORAL
  Filled 2020-09-07: qty 2
  Filled 2020-09-07: qty 1

## 2020-09-07 MED ORDER — ORAL CARE MOUTH RINSE
15.0000 mL | Freq: Two times a day (BID) | OROMUCOSAL | Status: DC
  Administered 2020-09-07 – 2020-09-09 (×5): 15 mL via OROMUCOSAL

## 2020-09-07 NOTE — Progress Notes (Signed)
PROGRESS NOTE    Elizabeth Melton  BHA:[id number] DOB: 11-10-1941 DOA: 09/05/2020 PCP: Leighton Ruff, MD   Chief Complaint  Patient presents with  . Shortness of Breath    Brief Narrative:   79 year old with DM, hypothyroidism, hyperlipidemia, chronic thrombocytopenia came in for chest discomfort , was found to have submassive PE. CTA of the chest shows Positive for acute pulmonary embolus with large volume thrombus, including a thin saddle. Thromboembolic burden is large, particularly in the right lung. There is right heart strain with RV to LV ratio of 1.59, right heart dilatation, and reflux of contrast into the hepatic veins and IVC consistent with elevated right heart pressures. Venous duplex of the lower extremity shows Findings consistent with acute deep vein thrombosis involving the right  popliteal vein, a single right posterior tibial vein, and right peroneal veins. Echocardiogram shows Since the last study on 03/29/2020 right ventricular dilatation and  moderate systolic dysfunction is new. There is severe pulmonary hypertension with RVSP 70 mmHg.  Left ventricular ejection fraction, by estimation, is 60 to 65%. The  left ventricle has normal function. The left ventricle has no regional  wall motion abnormalities. Left ventricular diastolic parameters are  consistent with Grade I diastolic  dysfunction (impaired relaxation). Elevated left atrial pressure. PCCM/ IR consulted for TPA consideration.  But patient absolutely does not want to take any risk of bleeding or CVA and she has declined thrombolytics unless she continues to decline with associated worsening hemodynamic and respiratory failure.  She was started on IV heparin, recommend 72 hours of IV heparin and transition to DOAC of choice. Patient seen and examined at bedside she continues to require up to 8 L of nasal cannula oxygen to keep sats greater than 90%.  Blood pressure parameters are borderline to normal. Assessment &  Plan:   Principal Problem:   Acute on chronic respiratory failure with hypoxia (HCC) Active Problems:   Hypothyroidism   Hyperlipidemia   Diabetes mellitus without complication (HCC)   Essential (primary) hypertension   Tobacco abuse   ILD (interstitial lung disease) (HCC)   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HCC)   Acute on chronic hypoxic respiratory failure with submassive saddle pulmonary embolism with right heart strain Patient currently requiring up to 8 L of nasal cannula oxygen to keep sats greater than 90%.  Echocardiogram showed right ventricular strain and reduced systolic function. Patient does not want thrombolytics at this time unless she declines. Recommend 72 hours of IV heparin followed by oral anticoagulation. PCCM on board and appreciate recommendations.  History of interstitial lung disease Nasal cannula oxygen to keep sats greater than 90%. Recommend outpatient follow-up with pulmonology for pulmonary hypertension.   Smoking cessation counseling given.     Essential hypertension Blood pressure parameters are borderline to optimal.    Type 2 diabetes mellitus CBG (last 3)  No results for input(s): GLUCAP in the last 72 hours.   Leukocytosis Probably reactive    Mild thrombocytopenia Continue to monitor.  DVT prophylaxis: (Heparin) Code Status: (Full code.  Family Communication: discussed with daughter over the phone Disposition:   Status is: Inpatient  Remains inpatient appropriate because:Hemodynamically unstable, Ongoing diagnostic testing needed not appropriate for outpatient work up, IV treatments appropriate due to intensity of illness or inability to take PO and Inpatient level of care appropriate due to severity of illness, still on 8 lit of [place] oxygen.    Dispo: The patient is from: Home  Anticipated d/c is to: pending.               Anticipated d/c date is: 2 days              Patient currently is not medically  stable to d/c.       Consultants:   PCCM.   Procedures: None  Antimicrobials: None   Subjective:   Objective: Vitals:   09/07/20 0600 09/07/20 0700 09/07/20 0800 09/07/20 1100  BP: (!) 173/60  138/70 (!) 141/109  Pulse: (!) 49 (!) 59 65 76  Resp: (!) _0 (!) 21  Temp:   97.9 F (36.6 C)   TempSrc:   Oral   SpO2: 93% 93% 97% 91%  Weight:      Height:        Intake/Output Summary (Last 24 hours) at 09/07/2020 1315 Last data filed at 09/07/2020 0900 Gross per 24 hour  Intake 338.28 ml  Output --  Net 338.28 ml   Filed Weights   09/05/20 2025  Weight: 79 kg    Examination:  General exam: Appears in mild distress from sob.  Respiratory system: diminished at bases, shallow breathing, tachypnea on 8 lit of [place] oxygen.  Cardiovascular system: S1 & S2 heard, RRR. No JVD, murmurs, No pedal edema. Gastrointestinal system: Abdomen is nondistended, soft and nontender. Normal bowel sounds heard. Central nervous system: Alert and oriented. No focal neurological deficits. Extremities: no pedal edema.  Skin: No rashes  Psychiatry:Mood & affect appropriate.     Data Reviewed: I have personally reviewed following labs and imaging studies  CBC: Recent Labs  Lab 09/05/20 1454 09/06/20 0301 09/07/20 0251  WBC 14.6* 16.0* 13.4*  NEUTROABS 11.7*  --   --   HGB 16.1* 13.2 13.3  HCT 49.1* 40.6 40.8  MCV 101.4* 103.0* 103.0*  PLT 102* 103* 116*    Basic Metabolic Panel: Recent Labs  Lab 09/05/20 1454 09/06/20 0301  NA 139 138  K 4.2 4.4  CL 106 105  CO2 21* 22  GLUCOSE 171* 170*  BUN 26* 23  CREATININE 1.05* 1.03*  CALCIUM 8.9 8.3*    GFR: Estimated Creatinine Clearance: 46.8 mL/min (A) (by C-G formula based on SCr of 1.03 mg/dL (H)).  Liver Function Tests: Recent Labs  Lab 09/05/20 1454  AST 69*  ALT 67*  ALKPHOS 86  BILITOT 0.9  PROT 8.2*  ALBUMIN 3.6    CBG: No results for input(s): GLUCAP in the last 168 hours.   Recent Results (from  the past 240 hour(s))  SARS Coronavirus 2 by RT PCR (hospital order, performed in [facility] hospital lab) Nasopharyngeal Nasopharyngeal Swab     Status: None   Collection Time: 09/05/20  2:54 PM   Specimen: Nasopharyngeal Swab  Result Value Ref Range Status   SARS Coronavirus 2 NEGATIVE NEGATIVE Final    Comment: (NOTE) SARS-CoV-2 target nucleic acids are NOT DETECTED.  The SARS-CoV-2 RNA is generally detectable in upper and lower respiratory specimens during the acute phase of infection. The lowest concentration of SARS-CoV-2 viral copies this assay can detect is 250 copies / mL. A negative result does not preclude SARS-CoV-2 infection and should not be used as the sole basis for treatment or other patient management decisions.  A negative result may occur with improper specimen collection / handling, submission of specimen other than nasopharyngeal swab, presence of viral mutation(s) within the areas targeted by this assay, and inadequate number of viral copies (<250 copies / mL).  A negative result must be combined with clinical observations, patient history, and epidemiological information.  Fact Sheet for Patients:   StrictlyIdeas.no  Fact Sheet for Healthcare Providers: BankingDealers.co.za  This test is not yet approved or  cleared by the [place] FDA and has been authorized for detection and/or diagnosis of SARS-CoV-2 by FDA under an Emergency Use Authorization (EUA).  This EUA will remain in effect (meaning this test can be used) for the duration of the COVID-19 declaration under Section 564(b)(1) of the Act, 21 U.S.C. section 360bbb-3(b)(1), unless the authorization is terminated or revoked sooner.  Performed at [facility], [place] [address], [place], [place] [address]   Blood Culture (routine x 2)     Status: None (Preliminary result)   Collection Time: 09/05/20  2:54 PM   Specimen: BLOOD  Result  Value Ref Range Status   Specimen Description   Final    BLOOD RIGHT ANTECUBITAL Performed at [facility], [place] [address], [place], [place] [address]    Special Requests   Final    BOTTLES DRAWN AEROBIC AND ANAEROBIC Blood Culture adequate volume Performed at [place] [address] [place], [place] [address]    Culture   Final    NO GROWTH 2 DAYS Performed at [place] [address], [place], [place] [address]    Report Status PENDING  Incomplete  Blood Culture (routine x 2)     Status: None (Preliminary result)   Collection Time: 09/05/20  2:59 PM   Specimen: Left Antecubital; Blood  Result Value Ref Range Status   Specimen Description   Final    LEFT ANTECUBITAL Performed at [place] [address] [place], [place] [address]    Special Requests   Final    BOTTLES DRAWN AEROBIC AND ANAEROBIC Blood Culture results may not be optimal due to an excessive volume of blood received in culture bottles Performed at [place] [address], [place], [place] [address]    Culture   Final    NO GROWTH 1 DAY Performed at [facility], [place] [address] [place], [place] [address]    Report Status PENDING  Incomplete  MRSA PCR Screening     Status: None   Collection Time: 09/05/20  9:34 PM   Specimen: Nasal Mucosa; Nasopharyngeal  Result Value Ref Range Status   MRSA by PCR NEGATIVE NEGATIVE Final    Comment:        The GeneXpert MRSA Assay (FDA approved for NASAL specimens only), is one component of a comprehensive MRSA colonization surveillance program. It is not intended to diagnose MRSA infection nor to guide or monitor treatment for MRSA infections. Performed at [facility], [place] [address], [place], [place] [address]          Radiology Studies: CT Angio Chest PE W/Cm &/Or Wo Cm  Result Date: 09/05/2020 CLINICAL DATA:  Shortness of breath for 3 days.   Hypoxia. EXAM: CT ANGIOGRAPHY CHEST WITH CONTRAST TECHNIQUE: Multidetector CT imaging of the chest was performed using the standard protocol during bolus administration of intravenous contrast. Multiplanar CT image reconstructions and MIPs were obtained to evaluate the vascular anatomy. CONTRAST:  128m OMNIPAQUE IOHEXOL 350 MG/ML SOLN COMPARISON:  Radiograph earlier today. High-resolution chest CT 10/20/2019 FINDINGS: Cardiovascular: Positive for acute pulmonary embolus with thin saddle embolus. There are filling defects within the main right pulmonary artery extending into all lobar and many segmental  branches. Filling defect within the distal left pulmonary artery extends into the lower lobar and segmental branches. Thromboembolic burden is large. There is straightening of the intraventricular septum. Elevated RV to LV ratio of 1.59. Multi chamber cardiomegaly with right heart dilatation and reflux of contrast into the hepatic veins and IVC. Aortic atherosclerosis. Cannot assess for dissection given phase of contrast bolus timing tailored to pulmonary artery evaluation. No pericardial effusion. Mediastinum/Nodes: Calcified right hilar/mediastinal nodes consistent with prior granulomatous disease. No definite noncalcified adenopathy. No esophageal wall thickening. No visualized thyroid nodule. Lungs/Pleura: Emphysema as well as interstitial lung disease with peripheral honeycombing. Mild motion artifact limits detailed parenchymal assessment. No evidence of pulmonary infarct. There is no confluent consolidation. No pneumothorax. Trachea and central bronchi are patent. Upper Abdomen: Contrast refluxes into the hepatic veins and IVC. Gallstones incidentally noted. Musculoskeletal: Degenerative change in the spine. There are no acute or suspicious osseous abnormalities. Review of the MIP images confirms the above findings. IMPRESSION: 1. Positive for acute pulmonary embolus with large volume thrombus, including a  thin saddle. Thromboembolic burden is large, particularly in the right lung. There is right heart strain with RV to LV ratio of 1.59, right heart dilatation, and reflux of contrast into the hepatic veins and IVC consistent with elevated right heart pressures. 2. Emphysema with chronic interstitial lung disease. No evidence of pulmonary infarct. No definite acute pulmonary parenchymal findings, slight motion limitations. 3. Incidental cholelithiasis. Aortic Atherosclerosis (ICD10-I70.0) and Emphysema (ICD10-J43.9). Critical Value/emergent results were called by telephone at the time of interpretation on 09/05/2020 at 7:41 pm to Dr Carmin Muskrat , who verbally acknowledged these results. Electronically Signed   By: Keith Rake M.D.   On: 09/05/2020 19:41   DG Chest Port 1 View  Result Date: 09/07/2020 CLINICAL DATA:  Chest discomfort. EXAM: PORTABLE CHEST 1 VIEW COMPARISON:  CT 09/05/2020, 08/28/2018.  Chest x-ray 09/05/2020. FINDINGS: Cardiomegaly. Diffuse chronic bilateral interstitial prominence. Active superimposed interstitial process cannot be excluded. Low lung volumes with basilar atelectasis. Stable cardiomegaly. No pulmonary venous congestion. No acute bony abnormality. IMPRESSION: 1. Cardiomegaly. No pulmonary venous congestion. 2. Diffuse chronic bilateral interstitial prominence. Active superimposed interstitial process cannot be excluded. Low lung volumes with basilar atelectasis. Chest appears similar to prior studies. Electronically Signed   By: Marcello Moores  Register   On: 09/07/2020 11:05   DG Chest Port 1 View  Result Date: 09/05/2020 CLINICAL DATA:  Worsening shortness of breath for 3 days. EXAM: PORTABLE CHEST 1 VIEW COMPARISON:  Chest CT 10/20/2019 FINDINGS: Cardiomegaly is similar to prior CT allowing for differences in modality. Unchanged mediastinal contours. There is aortic atherosclerosis. Reticular opacities throughout both lungs corresponding to interstitial lung disease on prior  CT. No confluent consolidation. No pleural effusion or pneumothorax. Stable osseous structures. IMPRESSION: Cardiomegaly. Chronic reticular opacities throughout both lungs corresponding to interstitial lung disease and emphysema on prior CT. No superimposed acute abnormality. Aortic Atherosclerosis (ICD10-I70.0). Electronically Signed   By: Keith Rake M.D.   On: 09/05/2020 16:26   ECHOCARDIOGRAM COMPLETE  Result Date: 09/06/2020    ECHOCARDIOGRAM REPORT   Patient Name:   KACEE SUKHU Date of Exam: 09/06/2020 Medical Rec #:  [id number]          Height:       65.0 in Accession #:    [id number]         Weight:       174.2 lb Date of Birth:  03-02-1941          BSA:  1.865 m Patient Age:    53 years           BP:           169/75 mmHg Patient Gender: F                  HR:           74 bpm. Exam Location:  Inpatient Procedure: 2D Echo Indications:    Pulmonary Embolus I26.99  History:        Patient has prior history of Echocardiogram examinations, most                 recent 03/29/2020. Risk Factors:Hypertension and Diabetes.  Sonographer:    Mikki Santee RDCS (AE) Referring Phys: [id number] AVA SWAYZE IMPRESSIONS  1. Since the last study on 03/29/2020 right ventricular dilatation and moderate systolic dysfunction is new. There is severe pulmonary hypertension with RVSP 70 mmHg.  2. Left ventricular ejection fraction, by estimation, is 60 to 65%. The left ventricle has normal function. The left ventricle has no regional wall motion abnormalities. Left ventricular diastolic parameters are consistent with Grade I diastolic dysfunction (impaired relaxation). Elevated left atrial pressure.  3. Right ventricular systolic function is severely reduced. The right ventricular size is severely enlarged. There is severely elevated pulmonary artery systolic pressure. The estimated right ventricular systolic pressure is 47.0 mmHg.  4. Left atrial size was moderately dilated.  5. Right atrial size was moderately  dilated.  6. The mitral valve is normal in structure. Moderate mitral valve regurgitation. No evidence of mitral stenosis. Moderate mitral annular calcification.  7. The aortic valve is normal in structure. Aortic valve regurgitation is moderate. No aortic stenosis is present.  8. The inferior vena cava is normal in size with greater than 50% respiratory variability, suggesting right atrial pressure of 3 mmHg. FINDINGS  Left Ventricle: Left ventricular ejection fraction, by estimation, is 60 to 65%. The left ventricle has normal function. The left ventricle has no regional wall motion abnormalities. The left ventricular internal cavity size was normal in size. There is  no left ventricular hypertrophy. Left ventricular diastolic parameters are consistent with Grade I diastolic dysfunction (impaired relaxation). Elevated left atrial pressure. Right Ventricle: The right ventricular size is severely enlarged. No increase in right ventricular wall thickness. Right ventricular systolic function is severely reduced. There is severely elevated pulmonary artery systolic pressure. The tricuspid regurgitant velocity is 3.96 m/s, and with an assumed right atrial pressure of 15 mmHg, the estimated right ventricular systolic pressure is 96.2 mmHg. Left Atrium: Left atrial size was moderately dilated. Right Atrium: Right atrial size was moderately dilated. Pericardium: There is no evidence of pericardial effusion. Mitral Valve: The mitral valve is normal in structure. There is mild thickening of the mitral valve leaflet(s). There is mild calcification of the mitral valve leaflet(s). Moderate mitral annular calcification. Moderate mitral valve regurgitation. No evidence of mitral valve stenosis. Tricuspid Valve: The tricuspid valve is normal in structure. Tricuspid valve regurgitation is not demonstrated. No evidence of tricuspid stenosis. Aortic Valve: The aortic valve is normal in structure. Aortic valve regurgitation is  moderate. Aortic regurgitation PHT measures 443 msec. No aortic stenosis is present. Aortic valve mean gradient measures 8.0 mmHg. Aortic valve peak gradient measures 16.2 mmHg. Aortic valve area, by VTI measures 1.54 cm. Pulmonic Valve: The pulmonic valve was normal in structure. Pulmonic valve regurgitation is not visualized. No evidence of pulmonic stenosis. Aorta: The aortic root is normal in size and structure.  Venous: The inferior vena cava is normal in size with greater than 50% respiratory variability, suggesting right atrial pressure of 3 mmHg. IAS/Shunts: No atrial level shunt detected by color flow Doppler.  LEFT VENTRICLE PLAX 2D LVIDd:         4.70 cm  Diastology LVIDs:         3.20 cm  LV e' medial:    6.74 cm/s LV PW:         1.00 cm  LV E/e' medial:  12.4 LV IVS:        1.00 cm  LV e' lateral:   6.96 cm/s LVOT diam:     1.90 cm  LV E/e' lateral: 12.1 LV SV:         58 LV SV Index:   31 LVOT Area:     2.84 cm  RIGHT VENTRICLE TAPSE (M-mode): 1.5 cm LEFT ATRIUM             Index       RIGHT ATRIUM           Index LA diam:        3.90 cm 2.09 cm/m  RA Area:     20.10 cm LA Vol (A2C):   96.2 ml 51.58 ml/m RA Volume:   56.60 ml  30.35 ml/m LA Vol (A4C):   84.3 ml 45.20 ml/m LA Biplane Vol: 91.6 ml 49.11 ml/m  AORTIC VALVE AV Area (Vmax):    1.42 cm AV Area (Vmean):   1.42 cm AV Area (VTI):     1.54 cm AV Vmax:           201.00 cm/s AV Vmean:          128.000 cm/s AV VTI:            0.380 m AV Peak Grad:      16.2 mmHg AV Mean Grad:      8.0 mmHg LVOT Vmax:         101.00 cm/s LVOT Vmean:        63.900 cm/s LVOT VTI:          0.206 m LVOT/AV VTI ratio: 0.54 AI PHT:            443 msec  AORTA Ao Root diam: 3.30 cm MITRAL VALVE               TRICUSPID VALVE MV Area (PHT): 3.21 cm    TR Peak grad:   62.7 mmHg MV Decel Time: 236 msec    TR Vmax:        396.00 cm/s MR Peak grad: 107.7 mmHg MR Mean grad: 64.0 mmHg    SHUNTS MR Vmax:      519.00 cm/s  Systemic VTI:  0.21 m MR Vmean:     376.0 cm/s    Systemic Diam: 1.90 cm MV E velocity: 83.90 cm/s Ena Dawley MD Electronically signed by Ena Dawley MD Signature Date/Time: 09/06/2020/2:37:03 PM    Final    VAS [place] LOWER EXTREMITY VENOUS (DVT)  Result Date: 09/07/2020  Lower Venous DVTStudy Indications: SOB, and pulmonary embolism.  Limitations: Poor ultrasound/tissue interface. Comparison Study: No prior study Performing Technologist: Maudry Mayhew RDMS, RVT, RDCS  Examination Guidelines: A complete evaluation includes B-mode imaging, spectral Doppler, color Doppler, and power Doppler as needed of all accessible portions of each vessel. Bilateral testing is considered an integral part of a complete examination. Limited examinations for reoccurring indications may be performed as noted. The reflux portion of  the exam is performed with the patient in reverse Trendelenburg.  +---------+---------------+---------+-----------+----------+--------------+ RIGHT    CompressibilityPhasicitySpontaneityPropertiesThrombus Aging +---------+---------------+---------+-----------+----------+--------------+ CFV      Full           Yes      Yes                                 +---------+---------------+---------+-----------+----------+--------------+ SFJ      Full                                                        +---------+---------------+---------+-----------+----------+--------------+ FV Prox  Full                                                        +---------+---------------+---------+-----------+----------+--------------+ FV Mid   Full                                                        +---------+---------------+---------+-----------+----------+--------------+ FV DistalFull                                                        +---------+---------------+---------+-----------+----------+--------------+ PFV      Full                                                         +---------+---------------+---------+-----------+----------+--------------+ POP      None                    No                   Acute          +---------+---------------+---------+-----------+----------+--------------+ PTV      None                    No                   Acute          +---------+---------------+---------+-----------+----------+--------------+ PERO     None                    No                   Acute          +---------+---------------+---------+-----------+----------+--------------+   +---------+---------------+---------+-----------+----------+--------------+ LEFT     CompressibilityPhasicitySpontaneityPropertiesThrombus Aging +---------+---------------+---------+-----------+----------+--------------+ CFV      Full           Yes      Yes                                 +---------+---------------+---------+-----------+----------+--------------+  SFJ      Full                                                        +---------+---------------+---------+-----------+----------+--------------+ FV Prox  Full                                                        +---------+---------------+---------+-----------+----------+--------------+ FV Mid   Full                                                        +---------+---------------+---------+-----------+----------+--------------+ FV DistalFull                                                        +---------+---------------+---------+-----------+----------+--------------+ PFV      Full                                                        +---------+---------------+---------+-----------+----------+--------------+ POP      Full           Yes      Yes                                 +---------+---------------+---------+-----------+----------+--------------+ PTV      Full                    Yes                                  +---------+---------------+---------+-----------+----------+--------------+ PERO     Full                    Yes                                 +---------+---------------+---------+-----------+----------+--------------+     Summary: RIGHT: - Findings consistent with acute deep vein thrombosis involving the right popliteal vein, a single right posterior tibial vein, and right peroneal veins. - No cystic structure found in the popliteal fossa.  LEFT: - There is no evidence of deep vein thrombosis in the lower extremity.  - No cystic structure found in the popliteal fossa.  *See table(s) above for measurements and observations.    Preliminary         Scheduled Meds: . Chlorhexidine Gluconate Cloth  6 each Topical Daily  . levothyroxine  75 mcg Oral QAC breakfast  . mouth rinse  15 mL Mouth Rinse BID  . nicotine  21  mg Transdermal Daily  . pravastatin  40 mg Oral q1800  . sodium chloride flush  3 mL Intravenous Q12H  . umeclidinium bromide  1 puff Inhalation Daily   Continuous Infusions: . heparin 1,200 Units/hr (09/07/20 0900)     LOS: 2 days        Hosie Poisson, MD [facility]   To contact the attending provider between 7A-7P or the covering provider during after hours 7P-7A, please log into the web site [email or web address] and access using universal [place] password for that web site. If you do not have the password, please call the hospital operator.  09/07/2020, 1:15 PM

## 2020-09-07 NOTE — Progress Notes (Signed)
ANTICOAGULATION CONSULT NOTE  Pharmacy Consult for Heparin Indication: pulmonary embolus  No Known Allergies  Patient Measurements: Height: 5' 5" (165.1 cm) Weight: 79 kg (174 lb 2.6 oz) IBW/kg (Calculated) : 57  Estimated height: 66 inches Heparin Dosing Weight:   Vital Signs: Temp: 98.1 F (36.7 C) (09/28 1200) Temp Source: Oral (09/28 1200) BP: 141/109 (09/28 1100) Pulse Rate: 76 (09/28 1100)  Labs: Recent Labs    09/05/20 1454 09/05/20 1454 09/05/20 2005 09/06/20 0301 09/06/20 1144 09/07/20 0251  HGB 16.1*   < >  --  13.2  --  13.3  HCT 49.1*  --   --  40.6  --  40.8  PLT 102*  --   --  103*  --  116*  APTT  --   --  32  --   --   --   LABPROT  --   --  15.9*  --   --   --   INR  --   --  1.3*  --   --   --   HEPARINUNFRC  --   --   --  0.46 0.62 0.61  CREATININE 1.05*  --   --  1.03*  --   --    < > = values in this interval not displayed.    Estimated Creatinine Clearance: 46.8 mL/min (A) (by C-G formula based on SCr of 1.03 mg/dL (H)).  Assessment: 79 yo female with interstitial lung disease presents with shortness of breath x 3 days.  CTa shows large PE.  Pharmacy consulted to dose IV heparin.  No PTA anticoagulant use reported. 09/07/2020   heparin level remains therapeutic at 0.61 on heparin at 1200 units/hr PLTC remains low as PTA w/ hx thrombocytopenia Hg 16.1> 13.2> 13.3 no bleeding reported. Pt refused thrombolytics for PE w/ R heart strain Doppler: acute R DVT  Goal of Therapy:  Heparin level 0.3-0.7 units/ml Monitor platelets by anticoagulation protocol: Yes   Plan:   Continue heparin IV continuous infusion 1200 units/hr  Daily heparin level and CBC  Monitor for signs/symptoms of bleeding  F/u for transition to [place], Pharm.D 09/07/2020 2:13 PM

## 2020-09-07 NOTE — Progress Notes (Signed)
NAME:  Elizabeth Melton, MRN:  [id number], DOB:  Oct 24, 1941, LOS: 2 ADMISSION DATE:  09/05/2020, CONSULTATION DATE:  09/06/2020 REFERRING MD:  Dr Karleen Hampshire, CHIEF COMPLAINT:  PE   Brief History   Patient came into the hospital with complaints of chest discomfort some back pain Found to have submassive PE  Past Medical History   Past Medical History:  Diagnosis Date  . Diabetes mellitus without complication ([place])   . Hyperlipidemia   . Hypothyroidism   . Thrombocytopenia ([place])    Significant Hospital Events     Consults:  Interventional radiology Pccm  Procedures:  None  Significant Diagnostic Tests:  CT chest IMPRESSION: 1. Positive for acute pulmonary embolus with large volume thrombus, including a thin saddle. Thromboembolic burden is large, particularly in the right lung. There is right heart strain with RV to LV ratio of 1.59, right heart dilatation, and reflux of contrast into the hepatic veins and IVC consistent with elevated right heart pressures. 2. Emphysema with chronic interstitial lung disease. No evidence of pulmonary infarct. No definite acute pulmonary parenchymal findings, slight motion limitations. 3. Incidental cholelithiasis. ECHO 9/27: Left Ventricle: Left ventricular ejection fraction, by estimation, is 60 to 65%. The left ventricle has normal function. The left ventricle has no regional wall motion abnormalities. Left ventricular diastolic parameters  are consistent with Grade I diastolic dysfunction (impaired relaxation).  Elevated left atrial pressure.  Right Ventricle: The right ventricular size is severely enlarged. No  increase in right ventricular wall thickness. Right ventricular systolic  function is severely reduced. There is severely elevated pulmonary artery  systolic pressure. The tricuspid regurgitant velocity is 3.96 m/s, and with an assumed right atrial pressure of 15 mmHg, the estimated right ventricular systolic pressure is 17.0  mmHg.  Micro Data:  None  Antimicrobials:  None  Interim history/subjective:   Still short of breath but comparing to yesterday feels better. She thinks that her chest pain is better.   Objective   Blood pressure 138/70, pulse 65, temperature 97.9 F (36.6 C), temperature source Oral, resp. rate 20, height _0  (1.651 m), weight 79 kg, SpO2 97 %.        Intake/Output Summary (Last 24 hours) at 09/07/2020 1000 Last data filed at 09/07/2020 0900 Gross per 24 hour  Intake 386.48 ml  Output no documentation  Net 386.48 ml   Filed Weights   09/05/20 2025  Weight: 79 kg    Examination:  General 79 year old black female resting in bed but does have sig WOB w/ any exertion HENT NCAT trace JVD MMM pulm decreased t/o mild accessory use w/ exertion. Currently on 6 liters high flow  Card RRR w/out sig murmur abd not tender Ext warm and dry w/ brisk CR no edema Neuro intact  Resolved Hospital Problem list     Assessment & Plan:   Acute on chronic  hypoxic respiratory failure w/ Submassive Saddle pulmonary embolism with high clot burden, right heart strain H/o PH  Pulse ox was 78% on EMS arrival  PESI score of 108-intermediate risk ECHO: did show evidence RV strain & reduced systolic fxn -She absolutely does not want to take even a low risk with bleeding or a CVA so she has declined thrombolytics UNLESS she was to have acute decline w/ associated worsening  hemodynamic or respiratory failure  Plan Would continue IV heparin thru today TPA would be a consideration IF she were to acutely decline further  If clinically remains stable would initiate DOAC of  choice  F/u LE [place]  PCXR now eval for evidence of infarct  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

## 2020-09-07 NOTE — Progress Notes (Addendum)
Bilateral lower extremity venous duplex completed. Refer to "CV Proc" under chart review to view preliminary results.  Preliminary results were discussed with Dr. Karleen Hampshire.  09/07/2020 11:07 AM Kelby Aline., MHA, RVT, RDCS, RDMS

## 2020-09-08 DIAGNOSIS — E119 Type 2 diabetes mellitus without complications: Secondary | ICD-10-CM

## 2020-09-08 DIAGNOSIS — R5381 Other malaise: Secondary | ICD-10-CM

## 2020-09-08 DIAGNOSIS — E039 Hypothyroidism, unspecified: Secondary | ICD-10-CM

## 2020-09-08 DIAGNOSIS — I82401 Acute embolism and thrombosis of unspecified deep veins of right lower extremity: Secondary | ICD-10-CM

## 2020-09-08 DIAGNOSIS — E785 Hyperlipidemia, unspecified: Secondary | ICD-10-CM

## 2020-09-08 DIAGNOSIS — Z720 Tobacco use: Secondary | ICD-10-CM

## 2020-09-08 DIAGNOSIS — J849 Interstitial pulmonary disease, unspecified: Secondary | ICD-10-CM

## 2020-09-08 LAB — CBC
HCT: 39.1 % (ref 36.0–46.0)
Hemoglobin: 12.8 g/dL (ref 12.0–15.0)
MCH: 33.6 pg (ref 26.0–34.0)
MCHC: 32.7 g/dL (ref 30.0–36.0)
MCV: 102.6 fL — ABNORMAL HIGH (ref 80.0–100.0)
Platelets: 125 10*3/uL — ABNORMAL LOW (ref 150–400)
RBC: 3.81 MIL/uL — ABNORMAL LOW (ref 3.87–5.11)
RDW: 14.4 % (ref 11.5–15.5)
WBC: 14.1 10*3/uL — ABNORMAL HIGH (ref 4.0–10.5)
nRBC: 0.2 % (ref 0.0–0.2)

## 2020-09-08 LAB — GLUCOSE, CAPILLARY: Glucose-Capillary: 138 mg/dL — ABNORMAL HIGH (ref 70–99)

## 2020-09-08 LAB — HEPARIN LEVEL (UNFRACTIONATED): Heparin Unfractionated: 0.32 IU/mL (ref 0.30–0.70)

## 2020-09-08 MED ORDER — OXYCODONE-ACETAMINOPHEN 5-325 MG PO TABS
1.0000 | ORAL_TABLET | Freq: Four times a day (QID) | ORAL | Status: DC | PRN
  Administered 2020-09-08 – 2020-09-09 (×2): 1 via ORAL
  Filled 2020-09-08 (×2): qty 1

## 2020-09-08 MED ORDER — LIP MEDEX EX OINT: TOPICAL_OINTMENT | CUTANEOUS | Status: DC | PRN

## 2020-09-08 MED ORDER — APIXABAN 5 MG PO TABS: 5.0000 mg | ORAL_TABLET | Freq: Two times a day (BID) | ORAL | Status: DC

## 2020-09-08 MED ORDER — LIP MEDEX EX OINT
TOPICAL_OINTMENT | CUTANEOUS | Status: AC
  Administered 2020-09-08: 1
  Filled 2020-09-08: qty 7

## 2020-09-08 MED ORDER — APIXABAN 5 MG PO TABS
10.0000 mg | ORAL_TABLET | Freq: Two times a day (BID) | ORAL | Status: DC
  Administered 2020-09-08 – 2020-09-10 (×5): 10 mg via ORAL
  Filled 2020-09-08 (×5): qty 2

## 2020-09-08 NOTE — Progress Notes (Signed)
PROGRESS NOTE  Elizabeth Melton EUM:[id number] DOB: 18-Sep-1941   PCP: Leighton Ruff, MD  Patient is from: Home  DOA: 09/05/2020 LOS: 3  Brief Narrative / Interim history: 79 year old female with history of PE, DM-2, ILD, chronic thrombocytopenia, HTN and HLD presenting with chest discomfort and found to have submassive saddle PE with right heart strain, and RLE DVT.  TTE showed severe P HTN with RVSP of 70 mmHg.  PCCM and IR consulted for TPA consideration that patient declined.  She was treated with IV heparin for 48 hours.  Eventually transitioned to starter pack Eliquis on 9/29.  Still requiring 8 L by [place] to maintain appropriate saturation.  Subjective: Seen and examined earlier this morning.  No major events overnight of this morning.  Reports improvement in his breathing.  She denies chest pain.  Has not had a bowel movement since admission.  Denies UTI symptoms.  Denies nausea or vomiting.  Objective: Vitals:   09/08/20 0700 09/08/20 0800 09/08/20 0821 09/08/20 0840  BP: (!) 147/49 (!) 165/56    Pulse: (!) 45 74    Resp: (!) 9 (!) 32    Temp:    97.7 F (36.5 C)  TempSrc:    Oral  SpO2: 98% 94% (!) 89%   Weight:      Height:        Intake/Output Summary (Last 24 hours) at 09/08/2020 1123 Last data filed at 09/08/2020 0600 Gross per 24 hour  Intake 478.43 ml  Output 200 ml  Net 278.43 ml   Filed Weights   09/05/20 2025  Weight: 79 kg    Examination:  GENERAL: No apparent distress.  Nontoxic. HEENT: MMM.  Vision and hearing grossly intact.  NECK: Supple.  No apparent JVD.  RESP:  No IWOB.  Fair aeration bilaterally. CVS:  RRR. Heart sounds normal.  ABD/GI/GU: BS+. Abd soft, NTND.  MSK/EXT:  Moves extremities. No apparent deformity. No edema.  SKIN: no apparent skin lesion or wound NEURO: Awake, alert and oriented appropriately.  No apparent focal neuro deficit. PSYCH: Calm. Normal affect.  Procedures:  None  Microbiology summarized: COVID-19 PCR  negative.  Assessment & Plan: Acute hypoxemic respiratory failure-due to submassive PE-Currently requiring 8 liter to maintain appropriate saturation. Per daughter not on oxygen at home also H&P says on 3L at baseline. -Wean oxygen as able -Manage PE as below  Acute submassive saddle PE with right heart strain/RLE DVT-prior history of PE.  Hemodynamically stable.  Declined TPA.  -Was on IV heparin.  Transitioned to starter pack Eliquis per PCCM on 9/29. -She needs to be on anticoagulation indefinitely given recurrent PE.  Severe pulmonary hypertension: Due to PE and possibly ILD: TTE revealed RVSP of 70 mmHg. -May need repeat TTE outpatient -Outpatient follow-up with pulmonology  History of interstitial lung disease -Outpatient follow-up with pulmonology.  Uncontrolled DM-2 Recent Labs  Lab 09/08/20 0746  GLUCAP 138*  -Check hemoglobin A1c -Continue current insulin regimen  Essential hypertension?  Normotensive.  Not on medication at home. -Continue monitoring  Chronic thrombocytopenia: Improved.  Platelet 125 today. -Continue monitoring  Tobacco use disorder -Encouraged tobacco cessation -Continue nicotine patch  Leukocytosis: Likely demargination versus infectious process. - continue monitoring  Hypothyroidism -Continue Synthroid  Debility/physical deconditioning -PT/OT eval   Body mass index is 28.98 kg/m.         DVT prophylaxis:  On Eliquis for PE/DVT.   Code Status: Full code Family Communication: Updated patient's daughter over the phone. Status is: Inpatient  Remains inpatient  appropriate because:IV treatments appropriate due to intensity of illness or inability to take PO, Inpatient level of care appropriate due to severity of illness and High oxygen requirement   Dispo: The patient is from: Home              Anticipated d/c is to: Home              Anticipated d/c date is: 2 days              Patient currently is not medically stable to  d/c.       Consultants:  IR PCCM   Sch Meds:  Scheduled Meds: . Chlorhexidine Gluconate Cloth  6 each Topical Daily  . levothyroxine  75 mcg Oral QAC breakfast  . mouth rinse  15 mL Mouth Rinse BID  . nicotine  21 mg Transdermal Daily  . pravastatin  40 mg Oral q1800  . sodium chloride flush  3 mL Intravenous Q12H  . umeclidinium bromide  1 puff Inhalation Daily   Continuous Infusions: . heparin 1,200 Units/hr (09/08/20 1100)   PRN Meds:.albuterol, bisacodyl, HYDROmorphone (DILAUDID) injection, lip balm, oxyCODONE-acetaminophen, senna-docusate  Antimicrobials: Anti-infectives (From admission, onward)   None       I have personally reviewed the following labs and images: CBC: Recent Labs  Lab 09/05/20 1454 09/06/20 0301 09/07/20 0251 09/08/20 0242  WBC 14.6* 16.0* 13.4* 14.1*  NEUTROABS 11.7*  --   --   --   HGB 16.1* 13.2 13.3 12.8  HCT 49.1* 40.6 40.8 39.1  MCV 101.4* 103.0* 103.0* 102.6*  PLT 102* 103* 116* 125*   BMP &GFR Recent Labs  Lab 09/05/20 1454 09/06/20 0301  NA 139 138  K 4.2 4.4  CL 106 105  CO2 21* 22  GLUCOSE 171* 170*  BUN 26* 23  CREATININE 1.05* 1.03*  CALCIUM 8.9 8.3*   Estimated Creatinine Clearance: 46.8 mL/min (A) (by C-G formula based on SCr of 1.03 mg/dL (H)). Liver & Pancreas: Recent Labs  Lab 09/05/20 1454  AST 69*  ALT 67*  ALKPHOS 86  BILITOT 0.9  PROT 8.2*  ALBUMIN 3.6   No results for input(s): LIPASE, AMYLASE in the last 168 hours. No results for input(s): AMMONIA in the last 168 hours. Diabetic: No results for input(s): HGBA1C in the last 72 hours. Recent Labs  Lab 09/08/20 0746  GLUCAP 138*   Cardiac Enzymes: No results for input(s): CKTOTAL, CKMB, CKMBINDEX, TROPONINI in the last 168 hours. No results for input(s): PROBNP in the last 8760 hours. Coagulation Profile: Recent Labs  Lab 09/05/20 2005  INR 1.3*   Thyroid Function Tests: No results for input(s): TSH, T4TOTAL, FREET4, T3FREE,  THYROIDAB in the last 72 hours. Lipid Profile: Recent Labs    09/05/20 1454  TRIG 75   Anemia Panel: Recent Labs    09/05/20 1454  FERRITIN 537*   Urine analysis: No results found for: COLORURINE, APPEARANCEUR, LABSPEC, PHURINE, GLUCOSEU, HGBUR, BILIRUBINUR, KETONESUR, PROTEINUR, UROBILINOGEN, NITRITE, LEUKOCYTESUR Sepsis Labs: Invalid input(s): PROCALCITONIN, [place]  Microbiology: Recent Results (from the past 240 hour(s))  SARS Coronavirus 2 by RT PCR (hospital order, performed in [facility]) Nasopharyngeal Nasopharyngeal Swab     Status: None   Collection Time: 09/05/20  2:54 PM   Specimen: Nasopharyngeal Swab  Result Value Ref Range Status   SARS Coronavirus 2 NEGATIVE NEGATIVE Final    Comment: (NOTE) SARS-CoV-2 target nucleic acids are NOT DETECTED.  The SARS-CoV-2 RNA is generally detectable in upper and  lower respiratory specimens during the acute phase of infection. The lowest concentration of SARS-CoV-2 viral copies this assay can detect is 250 copies / mL. A negative result does not preclude SARS-CoV-2 infection and should not be used as the sole basis for treatment or other patient management decisions.  A negative result may occur with improper specimen collection / handling, submission of specimen other than nasopharyngeal swab, presence of viral mutation(s) within the areas targeted by this assay, and inadequate number of viral copies (<250 copies / mL). A negative result must be combined with clinical observations, patient history, and epidemiological information.  Fact Sheet for Patients:   StrictlyIdeas.no  Fact Sheet for Healthcare Providers: BankingDealers.co.za  This test is not yet approved or  cleared by the [place] FDA and has been authorized for detection and/or diagnosis of SARS-CoV-2 by FDA under an Emergency Use Authorization (EUA).  This EUA will remain in effect  (meaning this test can be used) for the duration of the COVID-19 declaration under Section 564(b)(1) of the Act, 21 U.S.C. section 360bbb-3(b)(1), unless the authorization is terminated or revoked sooner.  Performed at [facility], [place] [address] [place], [place] [address]   Blood Culture (routine x 2)     Status: None (Preliminary result)   Collection Time: 09/05/20  2:54 PM   Specimen: BLOOD  Result Value Ref Range Status   Specimen Description   Final    BLOOD RIGHT ANTECUBITAL Performed at [facility], [place] [address] [place], [place] [address]    Special Requests   Final    BOTTLES DRAWN AEROBIC AND ANAEROBIC Blood Culture adequate volume Performed at [place] [address] [place], [place] [address]    Culture   Final    NO GROWTH 3 DAYS Performed at [facility], [place] [address] [place], [place] [address]    Report Status PENDING  Incomplete  Blood Culture (routine x 2)     Status: None (Preliminary result)   Collection Time: 09/05/20  2:59 PM   Specimen: Left Antecubital; Blood  Result Value Ref Range Status   Specimen Description   Final    LEFT ANTECUBITAL Performed at [place] [address], [place], [place] [address]    Special Requests   Final    BOTTLES DRAWN AEROBIC AND ANAEROBIC Blood Culture results may not be optimal due to an excessive volume of blood received in culture bottles Performed at [place] [address] [place], [place] [address]    Culture   Final    NO GROWTH 2 DAYS Performed at [place] [address], [place], [place] [address]    Report Status PENDING  Incomplete  MRSA PCR Screening     Status: None   Collection Time: 09/05/20  9:34 PM   Specimen: Nasal Mucosa; Nasopharyngeal  Result Value Ref Range Status   MRSA by PCR NEGATIVE NEGATIVE Final    Comment:        The GeneXpert MRSA Assay (FDA approved for NASAL  specimens only), is one component of a comprehensive MRSA colonization surveillance program. It is not intended to diagnose MRSA infection nor to guide or monitor treatment for MRSA infections. Performed at [facility] [place] [address] [place], [place] [address]     Radiology Studies: No results found.    Arlyss Weathersby T. [place]  If 7PM-7AM, please contact night-coverage [email or web address] 09/08/2020, 11:23 AM

## 2020-09-08 NOTE — Discharge Instructions (Addendum)
Information on my medicine - ELIQUIS (apixaban)  This medication education was reviewed with me or my healthcare representative as part of my discharge preparation.   Why was Eliquis prescribed for you? Eliquis was prescribed to treat blood clots that may have been found in the veins of your legs (deep vein thrombosis) or in your lungs (pulmonary embolism) and to reduce the risk of them occurring again.  What do You need to know about Eliquis ? The starting dose is 10 mg (two 5 mg tablets) taken TWICE daily for the FIRST SEVEN (7) DAYS, then on Wednesday 09/15/2020 he dose is reduced to ONE 5 mg tablet taken TWICE daily.  Eliquis may be taken with or without food.   Try to take the dose about the same time in the morning and in the evening. If you have difficulty swallowing the tablet whole please discuss with your pharmacist how to take the medication safely.  Take Eliquis exactly as prescribed and DO NOT stop taking Eliquis without talking to the doctor who prescribed the medication.  Stopping may increase your risk of developing a new blood clot.  Refill your prescription before you run out.  After discharge, you should have regular check-up appointments with your healthcare provider that is prescribing your Eliquis.    What do you do if you miss a dose? If a dose of ELIQUIS is not taken at the scheduled time, take it as soon as possible on the same day and twice-daily administration should be resumed. The dose should not be doubled to make up for a missed dose.  Important Safety Information A possible side effect of Eliquis is bleeding. You should call your healthcare provider right away if you experience any of the following: ? Bleeding from an injury or your nose that does not stop. ? Unusual colored urine (red or dark brown) or unusual colored stools (red or black). ? Unusual bruising for unknown reasons. ? A serious fall or if you hit your head (even if there is no  bleeding).  Some medicines may interact with Eliquis and might increase your risk of bleeding or clotting while on Eliquis. To help avoid this, consult your healthcare provider or pharmacist prior to using any new prescription or non-prescription medications, including herbals, vitamins, non-steroidal anti-inflammatory drugs (NSAIDs) and supplements.  This website has more information on Eliquis (apixaban): http://www.eliquis.com/eliquis/home

## 2020-09-08 NOTE — Progress Notes (Signed)
NAME:  Elizabeth Melton, MRN:  [id number], DOB:  10-Oct-1941, LOS: 3 ADMISSION DATE:  09/05/2020, CONSULTATION DATE:  09/06/2020 REFERRING MD:  Dr Karleen Hampshire, CHIEF COMPLAINT:  PE   Brief History   Patient came into the hospital with complaints of chest discomfort some back pain Found to have submassive PE  Past Medical History   Past Medical History:  Diagnosis Date  . Diabetes mellitus without complication ([place])   . Hyperlipidemia   . Hypothyroidism   . Thrombocytopenia ([place])    Significant Hospital Events     Consults:  Interventional radiology Pccm  Procedures:  None  Significant Diagnostic Tests:  CT chest IMPRESSION: 1. Positive for acute pulmonary embolus with large volume thrombus, including a thin saddle. Thromboembolic burden is large, particularly in the right lung. There is right heart strain with RV to LV ratio of 1.59, right heart dilatation, and reflux of contrast into the hepatic veins and IVC consistent with elevated right heart pressures. 2. Emphysema with chronic interstitial lung disease. No evidence of pulmonary infarct. No definite acute pulmonary parenchymal findings, slight motion limitations. 3. Incidental cholelithiasis. ECHO 9/27: Left Ventricle: Left ventricular ejection fraction, by estimation, is 60 to 65%. The left ventricle has normal function. The left ventricle has no regional wall motion abnormalities. Left ventricular diastolic parameters  are consistent with Grade I diastolic dysfunction (impaired relaxation).  Elevated left atrial pressure.  Right Ventricle: The right ventricular size is severely enlarged. No  increase in right ventricular wall thickness. Right ventricular systolic  function is severely reduced. There is severely elevated pulmonary artery  systolic pressure. The tricuspid regurgitant velocity is 3.96 m/s, and with an assumed right atrial pressure of 15 mmHg, the estimated right ventricular systolic pressure is 62.6  mmHg.  Bilateral lower extremity ultrasound 9/28: Right lower extremity consistent with acute DVT involving the popliteal vein, a single right posterior tibial vein and right peroneal vein, the left is without DVT Micro Data:  None  Antimicrobials:  None  Interim history/subjective:   Still short of breath but comparing to yesterday feels better. She thinks that her chest pain is better.   Objective   Blood pressure (Abnormal) 165/56, pulse 74, temperature 97.7 F (36.5 C), temperature source Oral, resp. rate (Abnormal) 32, height _0  (1.651 m), weight 79 kg, SpO2 (Abnormal) 89 %.        Intake/Output Summary (Last 24 hours) at 09/08/2020 9485 Last data filed at 09/08/2020 0600 Gross per 24 hour  Intake 478.43 ml  Output 200 ml  Net 278.43 ml   Filed Weights   09/05/20 2025  Weight: 79 kg    Examination:  General: This is a pleasant 79 year old black female she is resting in bed and currently in no acute distress.  Her work of breathing looks improved when comparing to exam on 9/28 HEENT: Normocephalic atraumatic, mild JVD but unchanged from exam prior mucous membranes moist Pulmonary: Able to speak longer word phrases without dyspnea.  Still has some accessory use with exertion.  Crackles both bases.  Currently 6 L/min via nasal cannula Cardiac: Regular rate and rhythm Abdomen soft not tender no organomegaly Extremities trace edema Neuro intact Resolved Hospital Problem list     Assessment & Plan:   Acute on chronic  hypoxic respiratory failure w/ Submassive Saddle pulmonary embolism with high clot burden, right heart strain, and right lower extremity DVT H/o PH  Pulse ox was 78% on EMS arrival  PESI score of 108-intermediate risk ECHO: did show evidence  RV strain & reduced systolic fxn -She absolutely does not want to take even a low risk with bleeding or a CVA so she has declined thrombolytics UNLESS she was to have acute decline w/ associated worsening   hemodynamic or respiratory failure -Clinically her symptom burden has improved Plan Would transition to oral DOAC  I think she will need at least 6 months of therapy, will also need follow-up echocardiogram, might need to consider lifelong anticoagulation if has significant underlying pulmonary hypertension given risk of chronic thromboembolic disease  Continue to wean supplemental oxygen  We will set her up with follow-up in our office  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  Specifically to follow-up on thromboembolic disease but also underlying ILD.  Next step will be to determine length of anticoagulation therapy, echocardiogram should be helpful with this as well  Erick Colace ACNP-BC [place] Pager # [phone] OR # [phone] if no answer

## 2020-09-08 NOTE — Progress Notes (Addendum)
ANTICOAGULATION CONSULT NOTE  Pharmacy Consult for Heparin>>apixaban Indication: pulmonary embolus  No Known Allergies  Patient Measurements: Height: 5' 5" (165.1 cm) Weight: 79 kg (174 lb 2.6 oz) IBW/kg (Calculated) : 57  Estimated height: 66 inches Heparin Dosing Weight:   Vital Signs: Temp: 97.7 F (36.5 C) (09/29 0840) Temp Source: Oral (09/29 0840) BP: 165/56 (09/29 0800) Pulse Rate: 74 (09/29 0800)  Labs: Recent Labs    09/05/20 1454 09/05/20 1454 09/05/20 2005 09/06/20 0301 09/06/20 0301 09/06/20 1144 09/07/20 0251 09/08/20 0242  HGB 16.1*   < >  --  13.2   < >  --  13.3 12.8  HCT 49.1*   < >  --  40.6  --   --  40.8 39.1  PLT 102*   < >  --  103*  --   --  116* 125*  APTT  --   --  32  --   --   --   --   --   LABPROT  --   --  15.9*  --   --   --   --   --   INR  --   --  1.3*  --   --   --   --   --   HEPARINUNFRC  --   --   --  0.46   < > 0.62 0.61 0.32  CREATININE 1.05*  --   --  1.03*  --   --   --   --    < > = values in this interval not displayed.    Estimated Creatinine Clearance: 46.8 mL/min (A) (by C-G formula based on SCr of 1.03 mg/dL (H)).  Assessment: 79 yo female with interstitial lung disease presents with shortness of breath x 3 days.  CTa shows large PE.  Pharmacy consulted to dose IV heparin.  No PTA anticoagulant use reported. 09/08/2020   heparin level remains therapeutic at 0.32 on heparin at 1200 units/hr PLTC remains low as PTA w/ hx thrombocytopenia Hg 16.1> 13.2> 13.3> 12.8 no bleeding reported. Pt refused thrombolytics for PE w/ R heart strain Doppler: acute R DVT To transition to apixaban  Goal of Therapy:  Heparin level 0.3-0.7 units/ml Monitor platelets by anticoagulation protocol: Yes   Plan:  Dc heparin drip apixaban 10 mg po bid x 7 days followed by apixaban 5 mg po BID  Will educate and give 30 day free card closer to DC date  Eudelia Bunch, Pharm.D 09/08/2020 10:21 AM

## 2020-09-08 NOTE — TOC Progression Note (Signed)
Transition of Care [facility]) - Progression Note    Patient Details  Name: Elizabeth Melton MRN: [id number] Date of Birth: 01/02/1941  Transition of [facility]) CM/SW Contact  Leeroy Cha, RN Phone Number: 09/08/2020, 9:18 AM  Clinical Narrative:    79 year old with DM, hypothyroidism, hyperlipidemia, chronic thrombocytopenia came in for chest discomfort , was found to have submassive PE. CTA of the chest shows Positive for acute pulmonary embolus with large volume thrombus, including a thin saddle. Thromboembolic burden is large, particularly in the right lung. There is right heart strain with RV to LV ratio of 1.59, right heart dilatation, and reflux of contrast into the hepatic veins and IVC consistent with elevated right heart pressures. Venous duplex of the lower extremity shows Findings consistent with acute deep vein thrombosis involving the right  popliteal vein, a single right posterior tibial vein, and right peroneal veins. Echocardiogram shows Since the last study on 03/29/2020 right ventricular dilatation and  moderate systolic dysfunction is new. There is severe pulmonary hypertension with RVSP 70 mmHg.  Left ventricular ejection fraction, by estimation, is 60 to 65%. The  left ventricle has normal function. The left ventricle has no regional  wall motion abnormalities. Left ventricular diastolic parameters are  consistent with Grade I diastolic  dysfunction (impaired relaxation). Elevated left atrial pressure. PCCM/ IR consulted for TPA consideration.  But patient absolutely does not want to take any risk of bleeding or CVA and she has declined thrombolytics unless she continues to decline with associated worsening hemodynamic and respiratory failure.  She was started on IV heparin, recommend 72 hours of IV heparin and transition to DOAC of choice. Patient seen and examined at bedside she continues to require up to 8 L of nasal cannula oxygen to keep sats greater than 90%.  Blood  pressure parameters are borderline to normal o2 at 8l/min via [place], iv heparin, wbc 14.1 Following for progression and toc needs From home plan is to return to home at this time.  Expected Discharge Plan: Home/Self Care Barriers to Discharge: Continued Medical Work up  Expected Discharge Plan and Services Expected Discharge Plan: Home/Self Care   Discharge Planning Services: CM Consult   Living arrangements for the past 2 months: Single Family Home                                       Social Determinants of Health (SDOH) Interventions    Readmission Risk Interventions No flowsheet data found.

## 2020-09-09 LAB — RENAL FUNCTION PANEL
Albumin: 2.5 g/dL — ABNORMAL LOW (ref 3.5–5.0)
Anion gap: 6 (ref 5–15)
BUN: 15 mg/dL (ref 8–23)
CO2: 24 mmol/L (ref 22–32)
Calcium: 8.2 mg/dL — ABNORMAL LOW (ref 8.9–10.3)
Chloride: 105 mmol/L (ref 98–111)
Creatinine, Ser: 0.75 mg/dL (ref 0.44–1.00)
GFR calc Af Amer: >60 mL/min (ref >60)
GFR calc non Af Amer: >60 mL/min (ref >60)
Glucose, Bld: 139 mg/dL — ABNORMAL HIGH (ref 70–99)
Phosphorus: 2.4 mg/dL — ABNORMAL LOW (ref 2.5–4.6)
Potassium: 4 mmol/L (ref 3.5–5.1)
Sodium: 135 mmol/L (ref 135–145)

## 2020-09-09 LAB — HEMOGLOBIN A1C
Hgb A1c MFr Bld: 7.3 % — ABNORMAL HIGH (ref 4.8–5.6)
Mean Plasma Glucose: 162.81 mg/dL

## 2020-09-09 LAB — CBC
HCT: 39.3 % (ref 36.0–46.0)
Hemoglobin: 12.5 g/dL (ref 12.0–15.0)
MCH: 32.9 pg (ref 26.0–34.0)
MCHC: 31.8 g/dL (ref 30.0–36.0)
MCV: 103.4 fL — ABNORMAL HIGH (ref 80.0–100.0)
Platelets: 139 10*3/uL — ABNORMAL LOW (ref 150–400)
RBC: 3.8 MIL/uL — ABNORMAL LOW (ref 3.87–5.11)
RDW: 14.4 % (ref 11.5–15.5)
WBC: 12.3 10*3/uL — ABNORMAL HIGH (ref 4.0–10.5)
nRBC: 0 % (ref 0.0–0.2)

## 2020-09-09 LAB — GLUCOSE, CAPILLARY
Glucose-Capillary: 119 mg/dL — ABNORMAL HIGH (ref 70–99)
Glucose-Capillary: 201 mg/dL — ABNORMAL HIGH (ref 70–99)

## 2020-09-09 LAB — MAGNESIUM: Magnesium: 2.5 mg/dL — ABNORMAL HIGH (ref 1.7–2.4)

## 2020-09-09 MED ORDER — INSULIN ASPART 100 UNIT/ML ~~LOC~~ SOLN
0.0000 [IU] | Freq: Every day | SUBCUTANEOUS | Status: DC
  Administered 2020-09-09: 2 [IU] via SUBCUTANEOUS

## 2020-09-09 MED ORDER — INSULIN ASPART 100 UNIT/ML ~~LOC~~ SOLN
0.0000 [IU] | Freq: Three times a day (TID) | SUBCUTANEOUS | Status: DC
  Administered 2020-09-10: 1 [IU] via SUBCUTANEOUS

## 2020-09-09 NOTE — Evaluation (Addendum)
Physical Therapy Evaluation Patient Details Name: Elizabeth Melton MRN: [id number] DOB: 04/27/1941 Today's Date: 09/09/2020   History of Present Illness  79 year old female with history of PE, DM-2, ILD, chronic thrombocytopenia, HTN and HLD presenting with chest discomfort and found to have submassive saddle PE with right heart strain, and RLE DVT.    PCCM and IR consulted for TPA consideration that patient declined.  She was treated with IV heparin for 48 hours.  Eventually transitioned to starter pack Eliquis on 9/29.  Clinical Impression  The patient resting in bed on 8 L HFNC at 95%, HR 65. Patient mobilized with min guard to recliner. SPO2 dropped to 85%, RR 25. Patient should progress  Mobility wise, possible need for supplemental O2 at DC. Continue to progress mobility and monitor Saturation. Pt admitted with above diagnosis.  Pt currently with functional limitations due to the deficits listed below (see PT Problem List). Pt will benefit from skilled PT to increase their independence and safety with mobility to allow discharge to the venue listed below.       Follow Up Recommendations Home health PT;Supervision - Intermittent    Equipment Recommendations   patient may want a shower seat.    Recommendations for Other Services       Precautions / Restrictions Precautions Precaution Comments: monitor sats      Mobility  Bed Mobility Overal bed mobility: Needs Assistance Bed Mobility: Supine to Sit     Supine to sit: Supervision;HOB elevated     General bed mobility comments: use of bed rail to push self up  Transfers Overall transfer level: Needs assistance Equipment used: 1 person hand held assist Transfers: Sit to/from Stand;Stand Pivot Transfers Sit to Stand: Min guard Stand pivot transfers: Min guard       General transfer comment: slow to rise, steadied self with bed rail. several small  steps to recliner  Ambulation/Gait                Stairs             Wheelchair Mobility    Modified Rankin (Stroke Patients Only)       Balance Overall balance assessment: Needs assistance Sitting-balance support: No upper extremity supported;Feet supported Sitting balance-Leahy Scale: Good     Standing balance support: During functional activity;Single extremity supported   Standing balance comment: reliant on 1 UE to support while taking steps                             Pertinent Vitals/Pain Pain Assessment: No/denies pain    Home Living Family/patient expects to be discharged to:: Private residence Living Arrangements: Children Available Help at Discharge: Family Type of Home: House Home Access: Stairs to enter     Home Layout: One level Home Equipment: Radio producer - single point Additional Comments: Daughter on RW post recent hip surgery so unable to assist.    Prior Function Level of Independence: Independent with assistive device(s)         Comments: uses cane outside     Hand Dominance   Dominant Hand: Right    Extremity/Trunk Assessment   Upper Extremity Assessment Upper Extremity Assessment: Defer to OT evaluation    Lower Extremity Assessment Lower Extremity Assessment: Generalized weakness    Cervical / Trunk Assessment Cervical / Trunk Assessment: Normal  Communication   Communication: No difficulties  Cognition Arousal/Alertness: Awake/alert Behavior During Therapy: WFL for tasks assessed/performed Overall Cognitive Status:  Within Functional Limits for tasks assessed                                        General Comments      Exercises General Exercises - Lower Extremity Long Arc Quad: AROM;Seated;5 reps;Both Hip Flexion/Marching: Standing;5 reps;AAROM   Assessment/Plan    PT Assessment Patient needs continued PT services  PT Problem List Decreased strength;Decreased activity tolerance;Cardiopulmonary status limiting activity;Decreased mobility       PT  Treatment Interventions DME instruction;Gait training;Functional mobility training;Stair training;Therapeutic activities;Therapeutic exercise;Patient/family education    PT Goals (Current goals can be found in the Care Plan section)  Acute Rehab PT Goals Patient Stated Goal: to go home PT Goal Formulation: With patient Time For Goal Achievement: 09/23/20 Potential to Achieve Goals: Good    Frequency Min 3X/week   Barriers to discharge        Co-evaluation               AM-PAC PT "6 Clicks" Mobility  Outcome Measure Help needed turning from your back to your side while in a flat bed without using bedrails?: None Help needed moving from lying on your back to sitting on the side of a flat bed without using bedrails?: None Help needed moving to and from a bed to a chair (including a wheelchair)?: A Little Help needed standing up from a chair using your arms (e.g., wheelchair or bedside chair)?: A Little Help needed to walk in hospital room?: A Little Help needed climbing 3-5 steps with a railing? : A Lot 6 Click Score: 19    End of Session Equipment Utilized During Treatment: Oxygen Activity Tolerance: Patient tolerated treatment well Patient left: in chair;with call bell/phone within reach Nurse Communication: Mobility status PT Visit Diagnosis: Difficulty in walking, not elsewhere classified (R26.2)    Time: 1798-1025 PT Time Calculation (min) (ACUTE ONLY): 20 min   Charges:   PT Evaluation $PT Eval Low Complexity: [place] Pager [phone] Office [phone]   Claretha Cooper 09/09/2020, 9:50 AM

## 2020-09-09 NOTE — Progress Notes (Signed)
PROGRESS NOTE  Elizabeth Melton:[id number] DOB: Aug 27, 1941   PCP: Leighton Ruff, MD  Patient is from: Home  DOA: 09/05/2020 LOS: 4  Brief Narrative / Interim history: 79 year old female with history of PE, DM-2, ILD, chronic thrombocytopenia, HTN and HLD presenting with chest discomfort and found to have submassive saddle PE with right heart strain, and RLE DVT.  TTE showed severe P HTN with RVSP of 70 mmHg.  PCCM and IR consulted for TPA consideration that patient declined.  She was treated with IV heparin for 48 hours.  Eventually transitioned to starter pack Eliquis on 9/29.  Oxygen requirement improved.  She is currently on 5 L.  Subjective: Seen and examined earlier this morning.  No major events overnight of this morning.  Reports bilateral lower back pain.  No neuro symptoms in her legs.  Breathing improved.  Reports intermittent cough.   Objective: Vitals:   09/09/20 0400 09/09/20 0536 09/09/20 0800 09/09/20 0904  BP:  (!) 147/36    Pulse:  (!) 58    Resp:  14    Temp: 98.9 F (37.2 C)  97.9 F (36.6 C)   TempSrc:   Oral   SpO2:  97%  97%  Weight:      Height:        Intake/Output Summary (Last 24 hours) at 09/09/2020 1209 Last data filed at 09/09/2020 0500 Gross per 24 hour  Intake 240 ml  Output 200 ml  Net 40 ml   Filed Weights   09/05/20 2025  Weight: 79 kg    Examination:  GENERAL: No apparent distress.  Nontoxic. HEENT: MMM.  Vision and hearing grossly intact.  NECK: Supple.  No apparent JVD.  RESP: 94% on 5 L.  No IWOB.  Fair aeration bilaterally. CVS:  RRR. Heart sounds normal.  ABD/GI/GU: BS+. Abd soft, NTND.  MSK/EXT:  Moves extremities. No apparent deformity. No edema.  SKIN: no apparent skin lesion or wound NEURO: Awake, alert and oriented appropriately.  No apparent focal neuro deficit. PSYCH: Calm. Normal affect.  Procedures:  None  Microbiology summarized: COVID-19 PCR negative.  Assessment & Plan: Acute hypoxemic  respiratory failure-due to submassive PE-improving.  She is now down to 5 L by nasal cannula -Wean oxygen as able -OOB/PT/OT -Encourage incentive spirometry -Manage PE as below  Acute submassive saddle PE with right heart strain/RLE DVT-prior history of PE. Declined TPA.  Hemodynamically stable.   -IV heparin>>starter pack Eliquis per PCCM on 9/29. -She needs to be on anticoagulation indefinitely given recurrent PE.  Severe pulmonary hypertension: Due to PE and possibly ILD: TTE revealed RVSP of 70 mmHg. -Needs repeat TTE outpatient -Outpatient follow-up with pulmonology  History of interstitial lung disease -Outpatient follow-up with pulmonology.  Uncontrolled DM-2 with hyperglycemia: A1c 7.3%.  Not on medication at home. Recent Labs  Lab 09/08/20 0746  GLUCAP 138*  -Start SSI-thin -CBG monitoring  Essential hypertension?  BP within fair range.  Not on medication at home. -Continue monitoring  Chronic thrombocytopenia: Improved.  Platelet 139 today. -Continue monitoring  Tobacco use disorder -Encouraged tobacco cessation -Continue nicotine patch  Leukocytosis: Likely demargination versus infectious process.  Improved. - continue monitoring  Hypothyroidism -Continue Synthroid  Debility/physical deconditioning -PT/OT eval   Body mass index is 28.98 kg/m.         DVT prophylaxis:  On Eliquis for PE/DVT.  apixaban (ELIQUIS) tablet 10 mg  apixaban (ELIQUIS) tablet 5 mg  Code Status: Full code Family Communication: Updated patient's daughter over the phone. Status  is: Inpatient  Remains inpatient appropriate because:Inpatient level of care appropriate due to severity of illness and High oxygen requirement   Dispo: The patient is from: Home              Anticipated d/c is to: Home              Anticipated d/c date is: 2 days              Patient currently is not medically stable to d/c.       Consultants:  IR PCCM   Sch Meds:  Scheduled  Meds: . apixaban  10 mg Oral BID   Followed by  . [START ON 09/15/2020] apixaban  5 mg Oral BID  . Chlorhexidine Gluconate Cloth  6 each Topical Daily  . levothyroxine  75 mcg Oral QAC breakfast  . mouth rinse  15 mL Mouth Rinse BID  . nicotine  21 mg Transdermal Daily  . pravastatin  40 mg Oral q1800  . sodium chloride flush  3 mL Intravenous Q12H  . umeclidinium bromide  1 puff Inhalation Daily   Continuous Infusions:  PRN Meds:.albuterol, bisacodyl, HYDROmorphone (DILAUDID) injection, lip balm, oxyCODONE-acetaminophen, senna-docusate  Antimicrobials: Anti-infectives (From admission, onward)   None       I have personally reviewed the following labs and images: CBC: Recent Labs  Lab 09/05/20 1454 09/06/20 0301 09/07/20 0251 09/08/20 0242 09/09/20 0252  WBC 14.6* 16.0* 13.4* 14.1* 12.3*  NEUTROABS 11.7*  --   --   --   --   HGB 16.1* 13.2 13.3 12.8 12.5  HCT 49.1* 40.6 40.8 39.1 39.3  MCV 101.4* 103.0* 103.0* 102.6* 103.4*  PLT 102* 103* 116* 125* 139*   BMP &GFR Recent Labs  Lab 09/05/20 1454 09/06/20 0301 09/09/20 0252  NA 139 138 135  K 4.2 4.4 4.0  CL 106 105 105  CO2 21* 22 24  GLUCOSE 171* 170* 139*  BUN 26* 23 15  CREATININE 1.05* 1.03* 0.75  CALCIUM 8.9 8.3* 8.2*  MG  --   --  2.5*  PHOS  --   --  2.4*   Estimated Creatinine Clearance: 60.2 mL/min (by C-G formula based on SCr of 0.75 mg/dL). Liver & Pancreas: Recent Labs  Lab 09/05/20 1454 09/09/20 0252  AST 69*  --   ALT 67*  --   ALKPHOS 86  --   BILITOT 0.9  --   PROT 8.2*  --   ALBUMIN 3.6 2.5*   No results for input(s): LIPASE, AMYLASE in the last 168 hours. No results for input(s): AMMONIA in the last 168 hours. Diabetic: Recent Labs    09/09/20 0252  HGBA1C 7.3*   Recent Labs  Lab 09/08/20 0746  GLUCAP 138*   Cardiac Enzymes: No results for input(s): CKTOTAL, CKMB, CKMBINDEX, TROPONINI in the last 168 hours. No results for input(s): PROBNP in the last 8760  hours. Coagulation Profile: Recent Labs  Lab 09/05/20 2005  INR 1.3*   Thyroid Function Tests: No results for input(s): TSH, T4TOTAL, FREET4, T3FREE, THYROIDAB in the last 72 hours. Lipid Profile: No results for input(s): CHOL, HDL, LDLCALC, TRIG, CHOLHDL, LDLDIRECT in the last 72 hours. Anemia Panel: No results for input(s): VITAMINB12, FOLATE, FERRITIN, TIBC, IRON, RETICCTPCT in the last 72 hours. Urine analysis: No results found for: COLORURINE, APPEARANCEUR, LABSPEC, PHURINE, GLUCOSEU, HGBUR, BILIRUBINUR, KETONESUR, PROTEINUR, UROBILINOGEN, NITRITE, LEUKOCYTESUR Sepsis Labs: Invalid input(s): PROCALCITONIN, LACTICIDVEN  Microbiology: Recent Results (from the past 240 hour(s))  SARS Coronavirus  2 by RT PCR (hospital order, performed in [facility] hospital lab) Nasopharyngeal Nasopharyngeal Swab     Status: None   Collection Time: 09/05/20  2:54 PM   Specimen: Nasopharyngeal Swab  Result Value Ref Range Status   SARS Coronavirus 2 NEGATIVE NEGATIVE Final    Comment: (NOTE) SARS-CoV-2 target nucleic acids are NOT DETECTED.  The SARS-CoV-2 RNA is generally detectable in upper and lower respiratory specimens during the acute phase of infection. The lowest concentration of SARS-CoV-2 viral copies this assay can detect is 250 copies / mL. A negative result does not preclude SARS-CoV-2 infection and should not be used as the sole basis for treatment or other patient management decisions.  A negative result may occur with improper specimen collection / handling, submission of specimen other than nasopharyngeal swab, presence of viral mutation(s) within the areas targeted by this assay, and inadequate number of viral copies (<250 copies / mL). A negative result must be combined with clinical observations, patient history, and epidemiological information.  Fact Sheet for Patients:   StrictlyIdeas.no  Fact Sheet for Healthcare  Providers: BankingDealers.co.za  This test is not yet approved or  cleared by the [place] FDA and has been authorized for detection and/or diagnosis of SARS-CoV-2 by FDA under an Emergency Use Authorization (EUA).  This EUA will remain in effect (meaning this test can be used) for the duration of the COVID-19 declaration under Section 564(b)(1) of the Act, 21 U.S.C. section 360bbb-3(b)(1), unless the authorization is terminated or revoked sooner.  Performed at [facility], [place] [address], [place], [place] [address]   Blood Culture (routine x 2)     Status: None (Preliminary result)   Collection Time: 09/05/20  2:54 PM   Specimen: BLOOD  Result Value Ref Range Status   Specimen Description   Final    BLOOD RIGHT ANTECUBITAL Performed at [facility], [place] [address] [place], [place] [address]    Special Requests   Final    BOTTLES DRAWN AEROBIC AND ANAEROBIC Blood Culture adequate volume Performed at [place] [address] [place], [place] [address]    Culture   Final    NO GROWTH 4 DAYS Performed at [facility], [place] [address], [place], [place] [address]    Report Status PENDING  Incomplete  Blood Culture (routine x 2)     Status: None (Preliminary result)   Collection Time: 09/05/20  2:59 PM   Specimen: Left Antecubital; Blood  Result Value Ref Range Status   Specimen Description   Final    LEFT ANTECUBITAL Performed at [place] [address], [place], [place] [address]    Special Requests   Final    BOTTLES DRAWN AEROBIC AND ANAEROBIC Blood Culture results may not be optimal due to an excessive volume of blood received in culture bottles Performed at [place] [address], [place], [place] [address]    Culture   Final    NO GROWTH 3 DAYS Performed at [facility], [place] [address] [place], [place] [address]    Report Status  PENDING  Incomplete  MRSA PCR Screening     Status: None   Collection Time: 09/05/20  9:34 PM   Specimen: Nasal Mucosa; Nasopharyngeal  Result Value Ref Range Status   MRSA by PCR NEGATIVE NEGATIVE Final    Comment:        The GeneXpert MRSA Assay (FDA approved for NASAL  specimens only), is one component of a comprehensive MRSA colonization surveillance program. It is not intended to diagnose MRSA infection nor to guide or monitor treatment for MRSA infections. Performed at [facility], [place] [address], [place], [place] [address]     Radiology Studies: No results found.    Carola Viramontes T. [place]  If 7PM-7AM, please contact night-coverage [email or web address] 09/09/2020, 12:09 PM

## 2020-09-09 NOTE — Evaluation (Signed)
Occupational Therapy Evaluation Patient Details Name: Elizabeth Melton MRN: [id number] DOB: 24-Jan-1941 Today's Date: 09/09/2020    History of Present Illness 79 year old female with history of PE, DM-2, ILD, chronic thrombocytopenia, HTN and HLD presenting with chest discomfort and found to have submassive saddle PE with right heart strain, and RLE DVT.   Clinical Impression   Elizabeth Melton is a 79 year year woman with above medical history history who presents on 4 liters [place] seated in recliner. On evaluation patient demonstrates the physical abilities to perform functional mobility and ADLs. Patient able to donn socks, stand and march in place, side step left and right and maintain standing balance without upper extremity support. Patient's o2 sat predominantly between 94-96% but dropped to 88% with marching but patient asymptomatic without complaints of dyspnea or fatigue. Patient and therapist discussed the use of a shower chair at home for safety and energy conservation. Patient reports have shower doors which limits the use. Other alternatives were discussed. No further OT needs at this time.   Follow Up Recommendations  No OT follow up    Equipment Recommendations  Tub/shower seat    Recommendations for Other Services       Precautions / Restrictions Precautions Precautions: None Precaution Comments: monitor sats, patient on 4 L [place] Restrictions Weight Bearing Restrictions: No      Mobility Bed Mobility               General bed mobility comments: seated in chair  Transfers                 General transfer comment: Able to stand, take steps left and right, march in place for 30 seconds with supervision.    Balance Overall balance assessment: No apparent balance deficits (not formally assessed)                                         ADL either performed or assessed with clinical judgement   ADL Overall ADL's : At baseline                                              Vision Baseline Vision/History: Wears glasses Wears Glasses: At all times       Perception     Praxis      Pertinent Vitals/Pain Pain Assessment: No/denies pain     Hand Dominance Right   Extremity/Trunk Assessment Upper Extremity Assessment Upper Extremity Assessment: Overall WFL for tasks assessed   Lower Extremity Assessment Lower Extremity Assessment: Defer to PT evaluation   Cervical / Trunk Assessment Cervical / Trunk Assessment: Normal   Communication Communication Communication: No difficulties   Cognition Arousal/Alertness: Awake/alert Behavior During Therapy: WFL for tasks assessed/performed Overall Cognitive Status: Within Functional Limits for tasks assessed                                     General Comments       Exercises     Shoulder Instructions      Home Living Family/patient expects to be discharged to:: Private residence Living Arrangements: Children Available Help at Discharge: Family Type of Home: House Home Access: Stairs to enter  Home Layout: One level     Bathroom Shower/Tub: Teacher, early years/pre: Standard     Home Equipment: Cane - single point   Additional Comments: tun/shower has shower doors.      Prior Functioning/Environment Level of Independence: Independent with assistive device(s)        Comments: uses cane outside. daughter s/p hip surgery so her help is limited.        OT Problem List: Cardiopulmonary status limiting activity      OT Treatment/Interventions:      OT Goals(Current goals can be found in the care plan section) Acute Rehab OT Goals Patient Stated Goal: to go home OT Goal Formulation: All assessment and education complete, DC therapy Time For Goal Achievement: 09/09/20 Potential to Achieve Goals: Good  OT Frequency:     Barriers to D/C:            Co-evaluation              AM-PAC  OT "6 Clicks" Daily Activity     Outcome Measure Help from another person eating meals?: None Help from another person taking care of personal grooming?: None Help from another person toileting, which includes using toliet, bedpan, or urinal?: None Help from another person bathing (including washing, rinsing, drying)?: None Help from another person to put on and taking off regular upper body clothing?: None Help from another person to put on and taking off regular lower body clothing?: None 6 Click Score: 24   End of Session Equipment Utilized During Treatment: Oxygen Nurse Communication:  (requests bath)  Activity Tolerance: Patient tolerated treatment well Patient left: in chair;with call bell/phone within reach  OT Visit Diagnosis: Muscle weakness (generalized) (M62.81)                Time: 6808-8110 OT Time Calculation (min): 20 min Charges:  OT General Charges $OT Visit: 1 Visit OT Evaluation $OT Eval Low Complexity: 1 Low  Machaela Caterino, OTR/L [place]  Office [phone] Pager: [phone]   Lenward Chancellor 09/09/2020, 4:00 PM

## 2020-09-10 DIAGNOSIS — I272 Pulmonary hypertension, unspecified: Secondary | ICD-10-CM

## 2020-09-10 DIAGNOSIS — J9601 Acute respiratory failure with hypoxia: Secondary | ICD-10-CM

## 2020-09-10 DIAGNOSIS — D696 Thrombocytopenia, unspecified: Secondary | ICD-10-CM

## 2020-09-10 LAB — CULTURE, BLOOD (ROUTINE X 2)
Culture: NO GROWTH
Special Requests: ADEQUATE

## 2020-09-10 LAB — GLUCOSE, CAPILLARY
Glucose-Capillary: 144 mg/dL — ABNORMAL HIGH (ref 70–99)
Glucose-Capillary: 117 mg/dL — ABNORMAL HIGH (ref 70–99)

## 2020-09-10 MED ORDER — SIMETHICONE 80 MG PO CHEW
80.0000 mg | CHEWABLE_TABLET | Freq: Three times a day (TID) | ORAL | Status: DC | PRN
  Administered 2020-09-10: 80 mg via ORAL
  Filled 2020-09-10: qty 1

## 2020-09-10 MED ORDER — APIXABAN 5 MG PO TABS: 5.0000 mg | ORAL_TABLET | Freq: Two times a day (BID) | ORAL | 1 refills | Status: DC

## 2020-09-10 MED ORDER — APIXABAN (ELIQUIS) VTE STARTER PACK (10MG AND 5MG): ORAL_TABLET | ORAL | 0 refills | Status: DC

## 2020-09-10 MED ORDER — STIOLTO RESPIMAT 2.5-2.5 MCG/ACT IN AERS: 2.0000 | INHALATION_SPRAY | Freq: Every day | RESPIRATORY_TRACT | 1 refills | Status: DC

## 2020-09-10 NOTE — Progress Notes (Signed)
SATURATION QUALIFICATIONS: (This note is used to comply with regulatory documentation for home oxygen)  Patient Saturations on Room Air at Rest = 89%  Patient Saturations on Room Air while Ambulating = 83%  Patient Saturations on 3  Liters of oxygen while Ambulating = 91%  Please briefly explain why patient needs home oxygen:to maintain  Oxygenation > 88% while ambulating. [place] Pager [phone] Office [phone]

## 2020-09-10 NOTE — Discharge Summary (Signed)
Physician Discharge Summary  KIRRA VERGA NKN:[id number] DOB: 08-04-1941 DOA: 09/05/2020  PCP: Leighton Ruff, MD  Admit date: 09/05/2020 Discharge date: 09/10/2020  Admitted From: Home Disposition: Home  Recommendations for Outpatient Follow-up:  1. Follow ups as below. 2. Please obtain CBC/BMP/Mag at follow up 3. Please follow up on the following pending results: None  Home Health: PT/OT Equipment/Devices: Home oxygen 3 L by [place], and shower set  Discharge Condition: Stable CODE STATUS: Full code   Follow-up Information    Leighton Ruff, MD. Schedule an appointment as soon as possible for a visit in 1 week(s).   Specialty: Family Medicine Contact information: [place] [id number] [address], [facility] Follow up.   Specialty: Home Health Services Why: to provide home health therapies Contact information: [address] [place] [address] [place] [address] [phone]              Hospital Course: 79 year old female with history of PE, DM-2, ILD, chronic thrombocytopenia, HTN and HLD presenting with chest discomfort and found to have submassive saddle PE with right heart strain, and RLE DVT.  TTE showed severe P HTN with RVSP of 70 mmHg.  PCCM and IR consulted for TPA consideration that patient declined.  She was treated with IV heparin for 48 hours.  Eventually transitioned to starter pack Eliquis on 9/29.  Oxygen requirement improved, and discharged on a starter pack Eliquis on 3 L oxygen by nasal cannula.  Pulmonology to arrange outpatient follow-up but ambulatory referral order to ensure this happens.  Home health PT/OT and shower seat ordered as recommended by therapy.  See individual problem list below for more hospital course. Discharge Diagnoses:  Acute hypoxemic respiratory failure-due to submassive PE-improved.  Required 3 L by [place] with exertion. -Discharged on 3 L by nasal cannula -Change to Spiriva to Stiolto  Respimat -Reassess at follow-up -Pulmonology to arrange outpatient follow-up.  Ambulatory referral ordered as well  Acute submassive saddle PE with right heart strain/RLE DVT-prior history of PE. -IV heparin>>starter pack Eliquis.  -She needs to be on anticoagulation indefinitely given recurrent PE.  Severe pulmonary hypertension: Due to PE and possibly ILD: TTE revealed new elevated RVSP of 70 mmHg. -Needs repeat TTE outpatient -Outpatient follow-up with pulmonology  History of interstitial lung disease -Outpatient follow-up with pulmonology.  Uncontrolled DM-2 with hyperglycemia: A1c 7.3%.  Not on medication at home. -Consider p.o. Metformin  Essential hypertension?  BP within fair range.  Not on medication at home.  Chronic thrombocytopenia: Improved.  -Check CBC at follow-up  Tobacco use disorder -Encouraged tobacco cessation  Leukocytosis: Likely demargination versus infectious process.  Improved. -Check CBC at follow-up  Hypothyroidism -Continue Synthroid  Debility/physical deconditioning -Home health PT/OT and shower set  Body mass index is 28.98 kg/m.            Discharge Exam: Vitals:   09/10/20 1230 09/10/20 1245  BP:    Pulse: 76   Resp: 20   Temp:  98.5 F (36.9 C)  SpO2: 92%     GENERAL: No apparent distress.  Nontoxic. HEENT: MMM.  Vision and hearing grossly intact.  NECK: Supple.  No apparent JVD.  RESP: 93% on 0.5 L by [place] at rest.  No IWOB.  Fair aeration bilaterally. CVS:  RRR. Heart sounds normal.  ABD/GI/GU: Bowel sounds present. Soft. Non tender.  MSK/EXT:  Moves extremities. No apparent deformity. No edema.  SKIN: no apparent skin lesion  or wound NEURO: Awake, alert and oriented appropriately.  No apparent focal neuro deficit. PSYCH: Calm. Normal affect.   Discharge Instructions  Discharge Instructions    Ambulatory referral to Pulmonology   Complete by: As directed    ILD, PE and PHTN   Reason for referral:  Interstitial Lung Disease(ILD)   Call MD for:  difficulty breathing, headache or visual disturbances   Complete by: As directed    Call MD for:  extreme fatigue   Complete by: As directed    Call MD for:  severe uncontrolled pain   Complete by: As directed    Diet general   Complete by: As directed    Discharge instructions   Complete by: As directed    It has been a pleasure taking care of you!  You were hospitalized and treated for pulmonary embolism (blood clot in your lungs) and DVT (blood clot in your leg).  We are discharging you on Eliquis (high blood thinner).  It is important that you take this medication as prescribed.  We strongly recommend you avoid any over-the-counter pain medication other than plain Tylenol while taking this medication.   Please follow-up with your primary care doctor in 1 to 2 weeks.  Lung doctors will arrange outpatient follow-up in 3 to 4 weeks  Please review your new medication list and the directions on your medications before you take them.   Take care,   Increase activity slowly   Complete by: As directed      Allergies as of 09/10/2020   No Known Allergies     Medication List    STOP taking these medications   Spiriva Respimat 2.5 MCG/ACT Aers Generic drug: Tiotropium Bromide Monohydrate     TAKE these medications   acetaminophen 500 MG tablet Commonly known as: TYLENOL Take 500 mg by mouth daily as needed for headache (pain).   Apixaban Starter Pack (48m and 534m Commonly known as: ELIQUIS STARTER PACK Take as directed on package: start with two-83m53mablets twice daily for 7 days. On day 8, switch to one-83mg18mblet twice daily.   apixaban 5 MG Tabs tablet Commonly known as: ELIQUIS Take 1 tablet (5 mg total) by mouth 2 (two) times daily. Start taking on: October 11, 2020   b complex vitamins tablet Take 1 tablet by mouth daily.   Calcium + D3 600-200 MG-UNIT Tabs Take 2 tablets by mouth daily.   cholecalciferol 25 MCG  (1000 UNIT) tablet Commonly known as: VITAMIN D3 Take 1,000 Units by mouth daily.   FISH OIL PO Take 1 capsule by mouth daily.   glucose blood test strip 1 each by Other route as needed. Use as instructed   levothyroxine 75 MCG tablet Commonly known as: SYNTHROID Take 75 mcg by mouth daily before breakfast.   lovastatin 40 MG tablet Commonly known as: MEVACOR Take 40 mg by mouth at bedtime.   nicotine 21 mg/24hr patch Commonly known as: NICODERM CQ - dosed in mg/24 hours Place 21 mg onto the skin daily as needed (smoking cessation).   Smart Sense Thin Lancets 26G Misc by Does not apply route.   Stiolto Respimat 2.5-2.5 MCG/ACT Aers Generic drug: Tiotropium Bromide-Olodaterol Inhale 2 puffs into the lungs daily.            Durable Medical Equipment  (From admission, onward)         Start     Ordered   09/10/20 1127  For home use only DME oxygen  Once  Question Answer Comment  Length of Need 6 Months   Mode or (Route) Nasal cannula   Liters per Minute 3   Frequency Continuous (stationary and portable oxygen unit needed)   Oxygen delivery system Gas      09/10/20 1126   09/10/20 0954  DME Shower stool  Once        09/10/20 0958          Consultations:  PCCM/pulmonology  Procedures/Studies:  2D Echo on 09/06/2020 1. Since the last study on 03/29/2020 right ventricular dilatation and  moderate systolic dysfunction is new. There is severe pulmonary  hypertension with RVSP 70 mmHg.  2. Left ventricular ejection fraction, by estimation, is 60 to 65%. The  left ventricle has normal function. The left ventricle has no regional  wall motion abnormalities. Left ventricular diastolic parameters are  consistent with Grade I diastolic  dysfunction (impaired relaxation). Elevated left atrial pressure.  3. Right ventricular systolic function is severely reduced. The right  ventricular size is severely enlarged. There is severely elevated  pulmonary artery  systolic pressure. The estimated right ventricular  systolic pressure is 76.7 mmHg.  4. Left atrial size was moderately dilated.  5. Right atrial size was moderately dilated.  6. The mitral valve is normal in structure. Moderate mitral valve  regurgitation. No evidence of mitral stenosis. Moderate mitral annular  calcification.  7. The aortic valve is normal in structure. Aortic valve regurgitation is  moderate. No aortic stenosis is present.  8. The inferior vena cava is normal in size with greater than 50%  respiratory variability, suggesting right atrial pressure of 3 mmHg.    CT Angio Chest PE W/Cm &/Or Wo Cm  Result Date: 09/05/2020 CLINICAL DATA:  Shortness of breath for 3 days.  Hypoxia. EXAM: CT ANGIOGRAPHY CHEST WITH CONTRAST TECHNIQUE: Multidetector CT imaging of the chest was performed using the standard protocol during bolus administration of intravenous contrast. Multiplanar CT image reconstructions and MIPs were obtained to evaluate the vascular anatomy. CONTRAST:  161m OMNIPAQUE IOHEXOL 350 MG/ML SOLN COMPARISON:  Radiograph earlier today. High-resolution chest CT 10/20/2019 FINDINGS: Cardiovascular: Positive for acute pulmonary embolus with thin saddle embolus. There are filling defects within the main right pulmonary artery extending into all lobar and many segmental branches. Filling defect within the distal left pulmonary artery extends into the lower lobar and segmental branches. Thromboembolic burden is large. There is straightening of the intraventricular septum. Elevated RV to LV ratio of 1.59. Multi chamber cardiomegaly with right heart dilatation and reflux of contrast into the hepatic veins and IVC. Aortic atherosclerosis. Cannot assess for dissection given phase of contrast bolus timing tailored to pulmonary artery evaluation. No pericardial effusion. Mediastinum/Nodes: Calcified right hilar/mediastinal nodes consistent with prior granulomatous disease. No definite  noncalcified adenopathy. No esophageal wall thickening. No visualized thyroid nodule. Lungs/Pleura: Emphysema as well as interstitial lung disease with peripheral honeycombing. Mild motion artifact limits detailed parenchymal assessment. No evidence of pulmonary infarct. There is no confluent consolidation. No pneumothorax. Trachea and central bronchi are patent. Upper Abdomen: Contrast refluxes into the hepatic veins and IVC. Gallstones incidentally noted. Musculoskeletal: Degenerative change in the spine. There are no acute or suspicious osseous abnormalities. Review of the MIP images confirms the above findings. IMPRESSION: 1. Positive for acute pulmonary embolus with large volume thrombus, including a thin saddle. Thromboembolic burden is large, particularly in the right lung. There is right heart strain with RV to LV ratio of 1.59, right heart dilatation, and reflux of contrast into the  hepatic veins and IVC consistent with elevated right heart pressures. 2. Emphysema with chronic interstitial lung disease. No evidence of pulmonary infarct. No definite acute pulmonary parenchymal findings, slight motion limitations. 3. Incidental cholelithiasis. Aortic Atherosclerosis (ICD10-I70.0) and Emphysema (ICD10-J43.9). Critical Value/emergent results were called by telephone at the time of interpretation on 09/05/2020 at 7:41 pm to Dr Carmin Muskrat , who verbally acknowledged these results. Electronically Signed   By: Keith Rake M.D.   On: 09/05/2020 19:41   DG Chest Port 1 View  Result Date: 09/07/2020 CLINICAL DATA:  Chest discomfort. EXAM: PORTABLE CHEST 1 VIEW COMPARISON:  CT 09/05/2020, 08/28/2018.  Chest x-ray 09/05/2020. FINDINGS: Cardiomegaly. Diffuse chronic bilateral interstitial prominence. Active superimposed interstitial process cannot be excluded. Low lung volumes with basilar atelectasis. Stable cardiomegaly. No pulmonary venous congestion. No acute bony abnormality. IMPRESSION: 1.  Cardiomegaly. No pulmonary venous congestion. 2. Diffuse chronic bilateral interstitial prominence. Active superimposed interstitial process cannot be excluded. Low lung volumes with basilar atelectasis. Chest appears similar to prior studies. Electronically Signed   By: Marcello Moores  Register   On: 09/07/2020 11:05   DG Chest Port 1 View  Result Date: 09/05/2020 CLINICAL DATA:  Worsening shortness of breath for 3 days. EXAM: PORTABLE CHEST 1 VIEW COMPARISON:  Chest CT 10/20/2019 FINDINGS: Cardiomegaly is similar to prior CT allowing for differences in modality. Unchanged mediastinal contours. There is aortic atherosclerosis. Reticular opacities throughout both lungs corresponding to interstitial lung disease on prior CT. No confluent consolidation. No pleural effusion or pneumothorax. Stable osseous structures. IMPRESSION: Cardiomegaly. Chronic reticular opacities throughout both lungs corresponding to interstitial lung disease and emphysema on prior CT. No superimposed acute abnormality. Aortic Atherosclerosis (ICD10-I70.0). Electronically Signed   By: Keith Rake M.D.   On: 09/05/2020 16:26   ECHOCARDIOGRAM COMPLETE  Result Date: 09/06/2020    ECHOCARDIOGRAM REPORT   Patient Name:   GWYNNETH FABIO Date of Exam: 09/06/2020 Medical Rec #:  [id number]          Height:       65.0 in Accession #:    [id number]         Weight:       174.2 lb Date of Birth:  1941-08-05          BSA:          1.865 m Patient Age:    40 years           BP:           169/75 mmHg Patient Gender: F                  HR:           74 bpm. Exam Location:  Inpatient Procedure: 2D Echo Indications:    Pulmonary Embolus I26.99  History:        Patient has prior history of Echocardiogram examinations, most                 recent 03/29/2020. Risk Factors:Hypertension and Diabetes.  Sonographer:    Mikki Santee RDCS (AE) Referring Phys: [id number] AVA SWAYZE IMPRESSIONS  1. Since the last study on 03/29/2020 right ventricular dilatation and  moderate systolic dysfunction is new. There is severe pulmonary hypertension with RVSP 70 mmHg.  2. Left ventricular ejection fraction, by estimation, is 60 to 65%. The left ventricle has normal function. The left ventricle has no regional wall motion abnormalities. Left ventricular diastolic parameters are consistent with Grade I diastolic dysfunction (impaired relaxation). Elevated left atrial pressure.  3. Right ventricular systolic function is severely reduced. The right ventricular size is severely enlarged. There is severely elevated pulmonary artery systolic pressure. The estimated right ventricular systolic pressure is 21.1 mmHg.  4. Left atrial size was moderately dilated.  5. Right atrial size was moderately dilated.  6. The mitral valve is normal in structure. Moderate mitral valve regurgitation. No evidence of mitral stenosis. Moderate mitral annular calcification.  7. The aortic valve is normal in structure. Aortic valve regurgitation is moderate. No aortic stenosis is present.  8. The inferior vena cava is normal in size with greater than 50% respiratory variability, suggesting right atrial pressure of 3 mmHg. FINDINGS  Left Ventricle: Left ventricular ejection fraction, by estimation, is 60 to 65%. The left ventricle has normal function. The left ventricle has no regional wall motion abnormalities. The left ventricular internal cavity size was normal in size. There is  no left ventricular hypertrophy. Left ventricular diastolic parameters are consistent with Grade I diastolic dysfunction (impaired relaxation). Elevated left atrial pressure. Right Ventricle: The right ventricular size is severely enlarged. No increase in right ventricular wall thickness. Right ventricular systolic function is severely reduced. There is severely elevated pulmonary artery systolic pressure. The tricuspid regurgitant velocity is 3.96 m/s, and with an assumed right atrial pressure of 15 mmHg, the estimated right  ventricular systolic pressure is 17.3 mmHg. Left Atrium: Left atrial size was moderately dilated. Right Atrium: Right atrial size was moderately dilated. Pericardium: There is no evidence of pericardial effusion. Mitral Valve: The mitral valve is normal in structure. There is mild thickening of the mitral valve leaflet(s). There is mild calcification of the mitral valve leaflet(s). Moderate mitral annular calcification. Moderate mitral valve regurgitation. No evidence of mitral valve stenosis. Tricuspid Valve: The tricuspid valve is normal in structure. Tricuspid valve regurgitation is not demonstrated. No evidence of tricuspid stenosis. Aortic Valve: The aortic valve is normal in structure. Aortic valve regurgitation is moderate. Aortic regurgitation PHT measures 443 msec. No aortic stenosis is present. Aortic valve mean gradient measures 8.0 mmHg. Aortic valve peak gradient measures 16.2 mmHg. Aortic valve area, by VTI measures 1.54 cm. Pulmonic Valve: The pulmonic valve was normal in structure. Pulmonic valve regurgitation is not visualized. No evidence of pulmonic stenosis. Aorta: The aortic root is normal in size and structure. Venous: The inferior vena cava is normal in size with greater than 50% respiratory variability, suggesting right atrial pressure of 3 mmHg. IAS/Shunts: No atrial level shunt detected by color flow Doppler.  LEFT VENTRICLE PLAX 2D LVIDd:         4.70 cm  Diastology LVIDs:         3.20 cm  LV e' medial:    6.74 cm/s LV PW:         1.00 cm  LV E/e' medial:  12.4 LV IVS:        1.00 cm  LV e' lateral:   6.96 cm/s LVOT diam:     1.90 cm  LV E/e' lateral: 12.1 LV SV:         58 LV SV Index:   31 LVOT Area:     2.84 cm  RIGHT VENTRICLE TAPSE (M-mode): 1.5 cm LEFT ATRIUM             Index       RIGHT ATRIUM           Index LA diam:        3.90 cm 2.09 cm/m  RA Area:  20.10 cm LA Vol (A2C):   96.2 ml 51.58 ml/m RA Volume:   56.60 ml  30.35 ml/m LA Vol (A4C):   84.3 ml 45.20 ml/m LA  Biplane Vol: 91.6 ml 49.11 ml/m  AORTIC VALVE AV Area (Vmax):    1.42 cm AV Area (Vmean):   1.42 cm AV Area (VTI):     1.54 cm AV Vmax:           201.00 cm/s AV Vmean:          128.000 cm/s AV VTI:            0.380 m AV Peak Grad:      16.2 mmHg AV Mean Grad:      8.0 mmHg LVOT Vmax:         101.00 cm/s LVOT Vmean:        63.900 cm/s LVOT VTI:          0.206 m LVOT/AV VTI ratio: 0.54 AI PHT:            443 msec  AORTA Ao Root diam: 3.30 cm MITRAL VALVE               TRICUSPID VALVE MV Area (PHT): 3.21 cm    TR Peak grad:   62.7 mmHg MV Decel Time: 236 msec    TR Vmax:        396.00 cm/s MR Peak grad: 107.7 mmHg MR Mean grad: 64.0 mmHg    SHUNTS MR Vmax:      519.00 cm/s  Systemic VTI:  0.21 m MR Vmean:     376.0 cm/s   Systemic Diam: 1.90 cm MV E velocity: 83.90 cm/s Ena Dawley MD Electronically signed by Ena Dawley MD Signature Date/Time: 09/06/2020/2:37:03 PM    Final    VAS [place] LOWER EXTREMITY VENOUS (DVT)  Result Date: 09/07/2020  Lower Venous DVTStudy Indications: SOB, and pulmonary embolism.  Limitations: Poor ultrasound/tissue interface. Comparison Study: No prior study Performing Technologist: Maudry Mayhew RDMS, RVT, RDCS  Examination Guidelines: A complete evaluation includes B-mode imaging, spectral Doppler, color Doppler, and power Doppler as needed of all accessible portions of each vessel. Bilateral testing is considered an integral part of a complete examination. Limited examinations for reoccurring indications may be performed as noted. The reflux portion of the exam is performed with the patient in reverse Trendelenburg.  +---------+---------------+---------+-----------+----------+--------------+ RIGHT    CompressibilityPhasicitySpontaneityPropertiesThrombus Aging +---------+---------------+---------+-----------+----------+--------------+ CFV      Full           Yes      Yes                                  +---------+---------------+---------+-----------+----------+--------------+ SFJ      Full                                                        +---------+---------------+---------+-----------+----------+--------------+ FV Prox  Full                                                        +---------+---------------+---------+-----------+----------+--------------+ FV Mid   Full                                                        +---------+---------------+---------+-----------+----------+--------------+  FV DistalFull                                                        +---------+---------------+---------+-----------+----------+--------------+ PFV      Full                                                        +---------+---------------+---------+-----------+----------+--------------+ POP      None                    No                   Acute          +---------+---------------+---------+-----------+----------+--------------+ PTV      None                    No                   Acute          +---------+---------------+---------+-----------+----------+--------------+ PERO     None                    No                   Acute          +---------+---------------+---------+-----------+----------+--------------+   +---------+---------------+---------+-----------+----------+--------------+ LEFT     CompressibilityPhasicitySpontaneityPropertiesThrombus Aging +---------+---------------+---------+-----------+----------+--------------+ CFV      Full           Yes      Yes                                 +---------+---------------+---------+-----------+----------+--------------+ SFJ      Full                                                        +---------+---------------+---------+-----------+----------+--------------+ FV Prox  Full                                                         +---------+---------------+---------+-----------+----------+--------------+ FV Mid   Full                                                        +---------+---------------+---------+-----------+----------+--------------+ FV DistalFull                                                        +---------+---------------+---------+-----------+----------+--------------+ PFV  Full                                                        +---------+---------------+---------+-----------+----------+--------------+ POP      Full           Yes      Yes                                 +---------+---------------+---------+-----------+----------+--------------+ PTV      Full                    Yes                                 +---------+---------------+---------+-----------+----------+--------------+ PERO     Full                    Yes                                 +---------+---------------+---------+-----------+----------+--------------+     Summary: RIGHT: - Findings consistent with acute deep vein thrombosis involving the right popliteal vein, a single right posterior tibial vein, and right peroneal veins. - No cystic structure found in the popliteal fossa.  LEFT: - There is no evidence of deep vein thrombosis in the lower extremity.  - No cystic structure found in the popliteal fossa.  *See table(s) above for measurements and observations. Electronically signed by Deitra Mayo MD on 09/07/2020 at 2:11:39 PM.    Final        The results of significant diagnostics from this hospitalization (including imaging, microbiology, ancillary and laboratory) are listed below for reference.     Microbiology: Recent Results (from the past 240 hour(s))  SARS Coronavirus 2 by RT PCR (hospital order, performed in [facility] hospital lab) Nasopharyngeal Nasopharyngeal Swab     Status: None   Collection Time: 09/05/20  2:54 PM   Specimen: Nasopharyngeal Swab  Result Value Ref  Range Status   SARS Coronavirus 2 NEGATIVE NEGATIVE Final    Comment: (NOTE) SARS-CoV-2 target nucleic acids are NOT DETECTED.  The SARS-CoV-2 RNA is generally detectable in upper and lower respiratory specimens during the acute phase of infection. The lowest concentration of SARS-CoV-2 viral copies this assay can detect is 250 copies / mL. A negative result does not preclude SARS-CoV-2 infection and should not be used as the sole basis for treatment or other patient management decisions.  A negative result may occur with improper specimen collection / handling, submission of specimen other than nasopharyngeal swab, presence of viral mutation(s) within the areas targeted by this assay, and inadequate number of viral copies (<250 copies / mL). A negative result must be combined with clinical observations, patient history, and epidemiological information.  Fact Sheet for Patients:   StrictlyIdeas.no  Fact Sheet for Healthcare Providers: BankingDealers.co.za  This test is not yet approved or  cleared by the [place] FDA and has been authorized for detection and/or diagnosis of SARS-CoV-2 by FDA under an Emergency Use Authorization (EUA).  This EUA will remain in effect (meaning this test can be used) for the duration of the  COVID-19 declaration under Section 564(b)(1) of the Act, 21 U.S.C. section 360bbb-3(b)(1), unless the authorization is terminated or revoked sooner.  Performed at [facility], [place] [address], [place], [place] [address]   Blood Culture (routine x 2)     Status: None   Collection Time: 09/05/20  2:54 PM   Specimen: BLOOD  Result Value Ref Range Status   Specimen Description   Final    BLOOD RIGHT ANTECUBITAL Performed at [facility], [place] [address] [place], [place] [address]    Special Requests   Final    BOTTLES DRAWN AEROBIC AND ANAEROBIC Blood Culture adequate volume Performed  at [facility] [address], [place], [place] [address]    Culture   Final    NO GROWTH 5 DAYS Performed at [facility], [place] [address], [place], [place] [address]    Report Status 09/10/2020 FINAL  Final  Blood Culture (routine x 2)     Status: None (Preliminary result)   Collection Time: 09/05/20  2:59 PM   Specimen: Left Antecubital; Blood  Result Value Ref Range Status   Specimen Description   Final    LEFT ANTECUBITAL Performed at [facility] [address], [place], [place] [address]    Special Requests   Final    BOTTLES DRAWN AEROBIC AND ANAEROBIC Blood Culture results may not be optimal due to an excessive volume of blood received in culture bottles Performed at [facility] [address] [place], [place] [address]    Culture   Final    NO GROWTH 4 DAYS Performed at [facility], [place] [address], [place], [place] [address]    Report Status PENDING  Incomplete  MRSA PCR Screening     Status: None   Collection Time: 09/05/20  9:34 PM   Specimen: Nasal Mucosa; Nasopharyngeal  Result Value Ref Range Status   MRSA by PCR NEGATIVE NEGATIVE Final    Comment:        The GeneXpert MRSA Assay (FDA approved for NASAL specimens only), is one component of a comprehensive MRSA colonization surveillance program. It is not intended to diagnose MRSA infection nor to guide or monitor treatment for MRSA infections. Performed at [facility], [place] [address] [place],  [address]      Labs: BNP (last 3 results) Recent Labs    09/05/20 1454  BNP 149.7*   Basic Metabolic Panel: Recent Labs  Lab 09/05/20 1454 09/06/20 0301 09/09/20 0252  NA 139 138 135  K 4.2 4.4 4.0  CL 106 105 105  CO2 21* 22 24  GLUCOSE 171* 170* 139*  BUN 26* 23 15  CREATININE 1.05* 1.03* 0.75  CALCIUM 8.9 8.3* 8.2*  MG  --   --  2.5*  PHOS  --   --  2.4*   Liver Function Tests: Recent Labs   Lab 09/05/20 1454 09/09/20 0252  AST 69*  --   ALT 67*  --   ALKPHOS 86  --   BILITOT 0.9  --   PROT 8.2*  --   ALBUMIN 3.6 2.5*   No results for input(s): LIPASE, AMYLASE in the last 168 hours. No results for input(s): AMMONIA in the last 168 hours. CBC: Recent Labs  Lab 09/05/20 1454 09/06/20 0301 09/07/20 0251 09/08/20 0242 09/09/20 0252  WBC 14.6* 16.0* 13.4* 14.1* 12.3*  NEUTROABS 11.7*  --   --   --   --  HGB 16.1* 13.2 13.3 12.8 12.5  HCT 49.1* 40.6 40.8 39.1 39.3  MCV 101.4* 103.0* 103.0* 102.6* 103.4*  PLT 102* 103* 116* 125* 139*   Cardiac Enzymes: No results for input(s): CKTOTAL, CKMB, CKMBINDEX, TROPONINI in the last 168 hours. BNP: Invalid input(s): POCBNP CBG: Recent Labs  Lab 09/08/20 0746 09/09/20 1810 09/09/20 2056 09/10/20 0822 09/10/20 1141  GLUCAP 138* 119* 201* 144* 117*   D-Dimer No results for input(s): DDIMER in the last 72 hours. Hgb A1c Recent Labs    09/09/20 0252  HGBA1C 7.3*   Lipid Profile No results for input(s): CHOL, HDL, LDLCALC, TRIG, CHOLHDL, LDLDIRECT in the last 72 hours. Thyroid function studies No results for input(s): TSH, T4TOTAL, T3FREE, THYROIDAB in the last 72 hours.  Invalid input(s): FREET3 Anemia work up No results for input(s): VITAMINB12, FOLATE, FERRITIN, TIBC, IRON, RETICCTPCT in the last 72 hours. Urinalysis No results found for: COLORURINE, APPEARANCEUR, LABSPEC, PHURINE, GLUCOSEU, HGBUR, BILIRUBINUR, KETONESUR, PROTEINUR, UROBILINOGEN, NITRITE, LEUKOCYTESUR Sepsis Labs Invalid input(s): PROCALCITONIN,  WBC,  LACTICIDVEN   Time coordinating discharge: 35 minutes  SIGNED:  Mercy Riding, MD  [facility] 09/10/2020, 4:39 PM  If 7PM-7AM, please contact night-coverage [email or web address]

## 2020-09-10 NOTE — Plan of Care (Signed)
Up in chair most of the day, on o2 at 3 liters for transfer home on o2.  Awaiting arrival of home care equipment, o2 tank for transport home and availability of ride home.

## 2020-09-10 NOTE — TOC Transition Note (Signed)
Transition of Care [facility]) - CM/SW Discharge Note   Patient Details  Name: NIZA SODERHOLM MRN: [id number] Date of Birth: Oct 11, 1941  [facility] CM/SW Contact:  Lennart Pall, LCSW Phone Number: 09/10/2020, 12:39 PM   Clinical Narrative:   Met with pt to review dc needs. Pt is agreeable to oxygen and tub seat order as well as HHPT/OT.  Referrals made (see below).  Pt to d/c home with daughter later today.  NO further TOC needs.    Final next level of care: [place] Barriers to Discharge: Barriers Resolved   Patient Goals and CMS Choice Patient states their goals for this hospitalization and ongoing recovery are:: to go home CMS Medicare.gov Compare Post Acute Care list provided to:: Patient Choice offered to / list presented to : Patient  Discharge Placement                       Discharge Plan and Services   Discharge Planning Services: CM Consult            DME Arranged: Oxygen, Shower stool DME Agency: [employer] Date DME Agency Contacted: 09/10/20 Time DME Agency Contacted: 1200 Representative spoke with at DME Agency: Bethanne Ginger; Freda Munro HH Arranged: PT, OT HH Agency: [facility] Date [place] Agency Contacted: 09/10/20 Time [place] Agency Contacted: 1200 Representative spoke with at [place]: [place] ([place]) Interventions     Readmission Risk Interventions Readmission Risk Prevention Plan 09/10/2020  Post Dischage Appt Complete  Medication Screening Complete  Transportation Screening Complete  Some recent data might be hidden

## 2020-09-10 NOTE — Progress Notes (Signed)
Physical Therapy Treatment Patient Details Name: Elizabeth Melton MRN: [id number] DOB: 1941/12/09 Today's Date: 09/10/2020    History of Present Illness 79 year old female with history of PE, DM-2, ILD, chronic thrombocytopenia, HTN and HLD presenting with chest discomfort and found to have submassive saddle PE with right heart strain, and RLE DVT.    PT Comments    The patient ambulated x 100' using cane. Gait is slow. Patient reports that she usually holds onto walls and furniture as needed. Declined need for RW. See saturation walk note for O2. Patient will need supplemental oxygen.  Follow Up Recommendations  Home health PT;Supervision - Intermittent     Equipment Recommendations  None recommended by PT    Recommendations for Other Services       Precautions / Restrictions Precautions Precaution Comments: monitor sats,    Mobility  Bed Mobility               General bed mobility comments: seated in chair  Transfers   Equipment used: Straight cane   Sit to Stand: Min guard            Ambulation/Gait Ambulation/Gait assistance: Min guard Gait Distance (Feet): 100 Feet Assistive device: Straight cane Gait Pattern/deviations: Step-through pattern;Drifts right/left Gait velocity: decr   General Gait Details: gait is slow, reports cruises at home on walls and furniture. dclined RW   Stairs             Wheelchair Mobility    Modified Rankin (Stroke Patients Only)       Balance     Sitting balance-Leahy Scale: Good     Standing balance support: Single extremity supported Standing balance-Leahy Scale: Fair Standing balance comment: reliant on 1 UE to support while taking steps                            Cognition Arousal/Alertness: Awake/alert                                     General Comments: anxious to DC      Exercises      General Comments        Pertinent Vitals/Pain Pain Assessment:  No/denies pain    Home Living                      Prior Function            PT Goals (current goals can now be found in the care plan section) Progress towards PT goals: Progressing toward goals    Frequency    Min 3X/week      PT Plan Current plan remains appropriate    Co-evaluation              AM-PAC PT "6 Clicks" Mobility   Outcome Measure  Help needed turning from your back to your side while in a flat bed without using bedrails?: None Help needed moving from lying on your back to sitting on the side of a flat bed without using bedrails?: None Help needed moving to and from a bed to a chair (including a wheelchair)?: A Little Help needed standing up from a chair using your arms (e.g., wheelchair or bedside chair)?: A Little Help needed to walk in hospital room?: A Little Help needed climbing 3-5 steps with a railing? : A Lot 6 Click Score:  19    End of Session Equipment Utilized During Treatment: Oxygen Activity Tolerance: Patient tolerated treatment well Patient left: in chair;with call bell/phone within reach Nurse Communication: Mobility status PT Visit Diagnosis: Difficulty in walking, not elsewhere classified (R26.2)     Time: 1050-1110 PT Time Calculation (min) (ACUTE ONLY): 20 min  Charges:  $Gait Training: 8-22 mins                     [place] Pager [phone] Office [phone]    Claretha Cooper 09/10/2020, 2:18 PM

## 2020-09-11 LAB — CULTURE, BLOOD (ROUTINE X 2): Culture: NO GROWTH

## 2020-09-12 DIAGNOSIS — E039 Hypothyroidism, unspecified: Secondary | ICD-10-CM | POA: Diagnosis not present

## 2020-09-12 DIAGNOSIS — I2602 Saddle embolus of pulmonary artery with acute cor pulmonale: Secondary | ICD-10-CM | POA: Diagnosis not present

## 2020-09-12 DIAGNOSIS — J439 Emphysema, unspecified: Secondary | ICD-10-CM | POA: Diagnosis not present

## 2020-09-12 DIAGNOSIS — I351 Nonrheumatic aortic (valve) insufficiency: Secondary | ICD-10-CM | POA: Diagnosis not present

## 2020-09-12 DIAGNOSIS — I10 Essential (primary) hypertension: Secondary | ICD-10-CM | POA: Diagnosis not present

## 2020-09-12 DIAGNOSIS — D696 Thrombocytopenia, unspecified: Secondary | ICD-10-CM | POA: Diagnosis not present

## 2020-09-12 DIAGNOSIS — J9611 Chronic respiratory failure with hypoxia: Secondary | ICD-10-CM | POA: Diagnosis not present

## 2020-09-12 DIAGNOSIS — E1151 Type 2 diabetes mellitus with diabetic peripheral angiopathy without gangrene: Secondary | ICD-10-CM | POA: Diagnosis not present

## 2020-09-12 DIAGNOSIS — J849 Interstitial pulmonary disease, unspecified: Secondary | ICD-10-CM | POA: Diagnosis not present

## 2020-09-16 DIAGNOSIS — E1169 Type 2 diabetes mellitus with other specified complication: Secondary | ICD-10-CM | POA: Diagnosis not present

## 2020-09-16 DIAGNOSIS — J449 Chronic obstructive pulmonary disease, unspecified: Secondary | ICD-10-CM | POA: Diagnosis not present

## 2020-09-16 DIAGNOSIS — J849 Interstitial pulmonary disease, unspecified: Secondary | ICD-10-CM | POA: Diagnosis not present

## 2020-09-16 DIAGNOSIS — Z7901 Long term (current) use of anticoagulants: Secondary | ICD-10-CM | POA: Diagnosis not present

## 2020-09-16 DIAGNOSIS — I2699 Other pulmonary embolism without acute cor pulmonale: Secondary | ICD-10-CM | POA: Diagnosis not present

## 2020-09-16 DIAGNOSIS — E878 Other disorders of electrolyte and fluid balance, not elsewhere classified: Secondary | ICD-10-CM | POA: Diagnosis not present

## 2020-09-16 DIAGNOSIS — Z0389 Encounter for observation for other suspected diseases and conditions ruled out: Secondary | ICD-10-CM | POA: Diagnosis not present

## 2020-09-16 DIAGNOSIS — E785 Hyperlipidemia, unspecified: Secondary | ICD-10-CM | POA: Diagnosis not present

## 2020-09-21 DIAGNOSIS — D696 Thrombocytopenia, unspecified: Secondary | ICD-10-CM | POA: Diagnosis not present

## 2020-09-21 DIAGNOSIS — I2602 Saddle embolus of pulmonary artery with acute cor pulmonale: Secondary | ICD-10-CM | POA: Diagnosis not present

## 2020-09-21 DIAGNOSIS — E1151 Type 2 diabetes mellitus with diabetic peripheral angiopathy without gangrene: Secondary | ICD-10-CM | POA: Diagnosis not present

## 2020-09-21 DIAGNOSIS — E039 Hypothyroidism, unspecified: Secondary | ICD-10-CM | POA: Diagnosis not present

## 2020-09-21 DIAGNOSIS — J9611 Chronic respiratory failure with hypoxia: Secondary | ICD-10-CM | POA: Diagnosis not present

## 2020-09-21 DIAGNOSIS — I10 Essential (primary) hypertension: Secondary | ICD-10-CM | POA: Diagnosis not present

## 2020-09-21 DIAGNOSIS — J849 Interstitial pulmonary disease, unspecified: Secondary | ICD-10-CM | POA: Diagnosis not present

## 2020-09-21 DIAGNOSIS — J439 Emphysema, unspecified: Secondary | ICD-10-CM | POA: Diagnosis not present

## 2020-09-21 DIAGNOSIS — I351 Nonrheumatic aortic (valve) insufficiency: Secondary | ICD-10-CM | POA: Diagnosis not present

## 2020-09-23 DIAGNOSIS — D7589 Other specified diseases of blood and blood-forming organs: Secondary | ICD-10-CM | POA: Diagnosis not present

## 2020-09-23 DIAGNOSIS — E1165 Type 2 diabetes mellitus with hyperglycemia: Secondary | ICD-10-CM | POA: Diagnosis not present

## 2020-09-23 DIAGNOSIS — E559 Vitamin D deficiency, unspecified: Secondary | ICD-10-CM | POA: Diagnosis not present

## 2020-09-23 DIAGNOSIS — R82998 Other abnormal findings in urine: Secondary | ICD-10-CM | POA: Diagnosis not present

## 2020-09-23 DIAGNOSIS — E538 Deficiency of other specified B group vitamins: Secondary | ICD-10-CM | POA: Diagnosis not present

## 2020-09-29 ENCOUNTER — Ambulatory Visit: Payer: Medicare HMO | Admitting: Pulmonary Disease

## 2020-09-29 ENCOUNTER — Other Ambulatory Visit: Payer: Self-pay

## 2020-09-29 ENCOUNTER — Encounter: Payer: Self-pay | Admitting: Pulmonary Disease

## 2020-09-29 VITALS — BP 138/62 | HR 90 | Temp 97.2°F | Ht 66.0 in | Wt 171.6 lb

## 2020-09-29 DIAGNOSIS — I270 Primary pulmonary hypertension: Secondary | ICD-10-CM | POA: Diagnosis not present

## 2020-09-29 NOTE — Progress Notes (Signed)
Elizabeth Melton    [id number]    [birth year]  Primary Care Physician:Barnes, Benjamine Mola, MD  Referring Physician: Leighton Ruff, [place],  [place] [address]  Chief complaint:   Patient was recently hospitalized for pulmonary embolism  HPI:  Symptoms are much better No shortness of breath No chronic cough No chest pains or chest discomfort Claims excellent compliance with blood thinners  We did talk about previous visits with Dr. [name] for a pulmonary fibrosis -Evaluation did show positive ANA   She feels she may want further evaluation and management options for the pulmonary fibrosis   Outpatient Encounter Medications as of 09/29/2020  Medication Sig  . acetaminophen (TYLENOL) 500 MG tablet Take 500 mg by mouth daily as needed for headache (pain).   Derrill Memo ON 10/11/2020] apixaban (ELIQUIS) 5 MG TABS tablet Take 1 tablet (5 mg total) by mouth 2 (two) times daily.  . APIXABAN (ELIQUIS) VTE STARTER PACK (10MG AND 5MG) Take as directed on package: start with two-54m tablets twice daily for 7 days. On day 8, switch to one-581mtablet twice daily.  . Marland Kitchen complex vitamins tablet Take 1 tablet by mouth daily.  . Calcium Carb-Cholecalciferol (CALCIUM + D3) 600-200 MG-UNIT TABS Take 2 tablets by mouth daily.   . cholecalciferol (VITAMIN D3) 25 MCG (1000 UT) tablet Take 1,000 Units by mouth daily.  . Marland Kitchenlucose blood test strip 1 each by Other route as needed. Use as instructed  . levothyroxine (SYNTHROID, LEVOTHROID) 75 MCG tablet Take 75 mcg by mouth daily before breakfast.   . lovastatin (MEVACOR) 40 MG tablet Take 40 mg by mouth at bedtime.  . nicotine (NICODERM CQ - DOSED IN MG/24 HOURS) 21 mg/24hr patch Place 21 mg onto the skin daily as needed (smoking cessation).  . Omega-3 Fatty Acids (FISH OIL PO) Take 1 capsule by mouth daily.  . Marland KitchenMART SENSE THIN LANCETS 26G MISC by Does not apply route.  . Tiotropium Bromide-Olodaterol (STIOLTO RESPIMAT)  2.5-2.5 MCG/ACT AERS Inhale 2 puffs into the lungs daily.   No facility-administered encounter medications on file as of 09/29/2020.    Allergies as of 09/29/2020  . (No Known Allergies)    Past Medical History:  Diagnosis Date  . Diabetes mellitus without complication ([place]  . Hyperlipidemia   . Hypothyroidism   . Thrombocytopenia ([facility]    Past Surgical History:  Procedure Laterality Date  . TUBAL LIGATION      Family History  Problem Relation Age of Onset  . Diabetes Mother   . Kidney disease Mother   . Hypertension Mother   . Other Father        tuberculosis  . Hypertension Sister   . Hyperlipidemia Sister     Social History   Socioeconomic History  . Marital status: Married    Spouse name: Not on file  . Number of children: Not on file  . Years of education: Not on file  . Highest education level: Not on file  Occupational History  . Not on file  Tobacco Use  . Smoking status: Current Every Day Smoker    Packs/day: 0.75    Years: 60.00    Pack years: 45.00    Types: Cigarettes  . Smokeless tobacco: Former [employer] developer  Quit date: 09/24/1962  . Tobacco comment: 1 pack lasts 4 days  Vaping Use  . Vaping Use: Never used  Substance and Sexual Activity  . Alcohol use:  Yes  . Drug use: No  . Sexual activity: Not on file  Other Topics Concern  . Not on file  Social History Narrative  . Not on file   Social Determinants of Health   Financial Resource Strain:   . Difficulty of Paying Living Expenses: Not on file  Food Insecurity:   . Worried About Charity fundraiser in the Last Year: Not on file  . Ran Out of Food in the Last Year: Not on file  Transportation Needs:   . Lack of Transportation (Medical): Not on file  . Lack of Transportation (Non-Medical): Not on file  Physical Activity:   . Days of Exercise per Week: Not on file  . Minutes of Exercise per Session: Not on file  Stress:   . Feeling of Stress : Not on file  Social Connections:   .  Frequency of Communication with Friends and Family: Not on file  . Frequency of Social Gatherings with Friends and Family: Not on file  . Attends Religious Services: Not on file  . Active Member of Clubs or Organizations: Not on file  . Attends Archivist Meetings: Not on file  . Marital Status: Not on file  Intimate Partner Violence:   . Fear of Current or Ex-Partner: Not on file  . Emotionally Abused: Not on file  . Physically Abused: Not on file  . Sexually Abused: Not on file    Review of Systems  Respiratory: Positive for shortness of breath.   Musculoskeletal: Negative for arthralgias.  Psychiatric/Behavioral: Negative for sleep disturbance.    Vitals:   09/29/20 1054  BP: 138/62  Pulse: 90  Temp: (!) 97.2 F (36.2 C)  SpO2: 91%     Physical Exam HENT:     Nose: No congestion.     Mouth/Throat:     Pharynx: No oropharyngeal exudate.  Eyes:     General:        Right eye: No discharge.        Left eye: No discharge.  Cardiovascular:     Rate and Rhythm: Normal rate and regular rhythm.     Heart sounds: No murmur heard.   Pulmonary:     Effort: No respiratory distress.     Breath sounds: No stridor. No wheezing or rhonchi.  Musculoskeletal:     Cervical back: No rigidity or tenderness.  Neurological:     Mental Status: She is alert.  Psychiatric:        Mood and Affect: Mood normal.    Data Reviewed: CT scan of the chest reviewed with the patient compared with normal CTs  Hospital records reviewed  Blood work reviewed  Assessment:  Pulmonary embolism -Continue anticoagulation for 6 months in toto  Repeat echocardiogram in 3 months to assess right heart function  Pulmonary fibrosis -Saw Dr. [name] recently -ANA positive -She had indicated that did not want any further evaluation or intervention previously but will like to review this further  Plan/Recommendations: Echocardiogram in 3 months  Continue anticoagulation for total of  6 months  Follow-up appointment with Dr. [name]  I will see her back in about 6 months   Sherrilyn Rist MD [facility] 09/29/2020, 11:25 AM  CC: Leighton Ruff, MD

## 2020-09-29 NOTE — Patient Instructions (Signed)
Pulmonary embolism -Improving symptoms -Continue blood thinners for 6 months in total -Echocardiogram in about 3 months from now to assess right ventricular function  Pulmonary fibrosis -Seen by Dr. [name] recently -Schedule appointment with Dr. [name] -To evaluate options of treatment  Call with any significant concerns  I will see you back in about 6 months

## 2020-09-30 DIAGNOSIS — D696 Thrombocytopenia, unspecified: Secondary | ICD-10-CM | POA: Diagnosis not present

## 2020-09-30 DIAGNOSIS — J439 Emphysema, unspecified: Secondary | ICD-10-CM | POA: Diagnosis not present

## 2020-09-30 DIAGNOSIS — J9611 Chronic respiratory failure with hypoxia: Secondary | ICD-10-CM | POA: Diagnosis not present

## 2020-09-30 DIAGNOSIS — I351 Nonrheumatic aortic (valve) insufficiency: Secondary | ICD-10-CM | POA: Diagnosis not present

## 2020-09-30 DIAGNOSIS — J849 Interstitial pulmonary disease, unspecified: Secondary | ICD-10-CM | POA: Diagnosis not present

## 2020-09-30 DIAGNOSIS — I10 Essential (primary) hypertension: Secondary | ICD-10-CM | POA: Diagnosis not present

## 2020-09-30 DIAGNOSIS — E1151 Type 2 diabetes mellitus with diabetic peripheral angiopathy without gangrene: Secondary | ICD-10-CM | POA: Diagnosis not present

## 2020-09-30 DIAGNOSIS — I2602 Saddle embolus of pulmonary artery with acute cor pulmonale: Secondary | ICD-10-CM | POA: Diagnosis not present

## 2020-09-30 DIAGNOSIS — E039 Hypothyroidism, unspecified: Secondary | ICD-10-CM | POA: Diagnosis not present

## 2020-10-08 DIAGNOSIS — E1151 Type 2 diabetes mellitus with diabetic peripheral angiopathy without gangrene: Secondary | ICD-10-CM | POA: Diagnosis not present

## 2020-10-08 DIAGNOSIS — J439 Emphysema, unspecified: Secondary | ICD-10-CM | POA: Diagnosis not present

## 2020-10-08 DIAGNOSIS — I10 Essential (primary) hypertension: Secondary | ICD-10-CM | POA: Diagnosis not present

## 2020-10-08 DIAGNOSIS — I2602 Saddle embolus of pulmonary artery with acute cor pulmonale: Secondary | ICD-10-CM | POA: Diagnosis not present

## 2020-10-08 DIAGNOSIS — D696 Thrombocytopenia, unspecified: Secondary | ICD-10-CM | POA: Diagnosis not present

## 2020-10-08 DIAGNOSIS — I351 Nonrheumatic aortic (valve) insufficiency: Secondary | ICD-10-CM | POA: Diagnosis not present

## 2020-10-08 DIAGNOSIS — E039 Hypothyroidism, unspecified: Secondary | ICD-10-CM | POA: Diagnosis not present

## 2020-10-08 DIAGNOSIS — J9611 Chronic respiratory failure with hypoxia: Secondary | ICD-10-CM | POA: Diagnosis not present

## 2020-10-08 DIAGNOSIS — J849 Interstitial pulmonary disease, unspecified: Secondary | ICD-10-CM | POA: Diagnosis not present

## 2020-10-11 DIAGNOSIS — J9611 Chronic respiratory failure with hypoxia: Secondary | ICD-10-CM | POA: Diagnosis not present

## 2020-10-11 DIAGNOSIS — J849 Interstitial pulmonary disease, unspecified: Secondary | ICD-10-CM | POA: Diagnosis not present

## 2020-10-11 DIAGNOSIS — J9601 Acute respiratory failure with hypoxia: Secondary | ICD-10-CM | POA: Diagnosis not present

## 2020-10-19 ENCOUNTER — Other Ambulatory Visit: Payer: Self-pay | Admitting: Internal Medicine

## 2020-10-19 MED ORDER — STIOLTO RESPIMAT 2.5-2.5 MCG/ACT IN AERS: 2.0000 | INHALATION_SPRAY | Freq: Every day | RESPIRATORY_TRACT | 1 refills | Status: DC

## 2020-11-03 DIAGNOSIS — I10 Essential (primary) hypertension: Secondary | ICD-10-CM | POA: Diagnosis not present

## 2020-11-03 DIAGNOSIS — M810 Age-related osteoporosis without current pathological fracture: Secondary | ICD-10-CM | POA: Diagnosis not present

## 2020-11-03 DIAGNOSIS — E785 Hyperlipidemia, unspecified: Secondary | ICD-10-CM | POA: Diagnosis not present

## 2020-11-03 DIAGNOSIS — E1165 Type 2 diabetes mellitus with hyperglycemia: Secondary | ICD-10-CM | POA: Diagnosis not present

## 2020-11-03 DIAGNOSIS — J449 Chronic obstructive pulmonary disease, unspecified: Secondary | ICD-10-CM | POA: Diagnosis not present

## 2020-11-03 DIAGNOSIS — E039 Hypothyroidism, unspecified: Secondary | ICD-10-CM | POA: Diagnosis not present

## 2020-11-03 DIAGNOSIS — E1169 Type 2 diabetes mellitus with other specified complication: Secondary | ICD-10-CM | POA: Diagnosis not present

## 2020-11-19 ENCOUNTER — Telehealth: Payer: Self-pay | Admitting: Internal Medicine

## 2020-11-19 NOTE — Telephone Encounter (Signed)
Dr. [name]- this ILD patient was erroneously put on your schedule for Monday in 15 minute slot. Is it okay to leave her or does she to be moved - your next available is 01/14/2021. Heather Reechio our former Garment/textile technologist scheduled it, when she was helping with scheduling. - Thanks -pr

## 2020-11-19 NOTE — Telephone Encounter (Signed)
Ok will manage. Thanks for headsup

## 2020-11-22 ENCOUNTER — Encounter: Payer: Self-pay | Admitting: Internal Medicine

## 2020-11-22 ENCOUNTER — Other Ambulatory Visit: Payer: Self-pay

## 2020-11-22 ENCOUNTER — Ambulatory Visit: Payer: Medicare HMO | Admitting: Internal Medicine

## 2020-11-22 VITALS — BP 108/72 | HR 77 | Temp 97.2°F | Ht 66.0 in | Wt 177.4 lb

## 2020-11-22 DIAGNOSIS — F17210 Nicotine dependence, cigarettes, uncomplicated: Secondary | ICD-10-CM

## 2020-11-22 DIAGNOSIS — J849 Interstitial pulmonary disease, unspecified: Secondary | ICD-10-CM | POA: Diagnosis not present

## 2020-11-22 DIAGNOSIS — Z86711 Personal history of pulmonary embolism: Secondary | ICD-10-CM

## 2020-11-22 DIAGNOSIS — R911 Solitary pulmonary nodule: Secondary | ICD-10-CM

## 2020-11-22 DIAGNOSIS — I288 Other diseases of pulmonary vessels: Secondary | ICD-10-CM

## 2020-11-22 NOTE — Patient Instructions (Addendum)
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  Plan -Respect your reluctance to undergo biopsy procedures or start antifibrotic's -Respective position to adopt an approach of serial monitoring and reassess the need for antifibrotic's  -Do high-resolution CT chest in  3 months  -Do spirometry and DLCO in 3 months  Pulmonary emphysema   Stable disease  Plan -Continue Spiriva   Pulmonary artery enlargement on CT  Pulmonary embolism September 2021  -Heart catheterization held off in August/September 2021 due to blood clot in the lung in September 2021  Plan- -continue Eliquis -Hold off on right heart catheterization for the moment   Followup   - J 3-4 months but after completing the above; Dr. RChase Callerin 30-minute slot

## 2020-11-22 NOTE — Progress Notes (Signed)
OV 09/04/2019  Subjective:  Patient ID: Elizabeth Melton, female , DOB: Jan 08, 1941 , age 79 y.o. , MRN: [id number] , ADDRESS: [address] [place] [address]   09/04/2019 -   Chief Complaint  Patient presents with  . Pulmonary Consult    Pt referred here by Dr. Drema Dallas for abnormal CT chest. Pt c/o DOE X years - states this is stable. Pt denies cough and CP/tightness and f/c/s.      HPI Elizabeth Melton 79 y.o. -presents to the interstitial lung disease clinic for evaluation of pulmonary fibrosis.  She was seen by the lung nodule program approximately a year ago when lung cancer screening CT chest showed interstitial lung disease changes.  The CT scan also showed a 7 mm right middle lobe nodule by the minor fissure.  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  [.  We then asked her to do interstitial lung disease questionnaire and serology and show for the clinic.  However she did not.  She is here today a year later.  In the interim she did have another CT scan of the chest.  The CT scan of the chest is not high-resolution CT chest.  I visualized the scan.  The 7 mm right middle lobe nodule has enlarged.  The ILD changes are still present.  She tells me that she skipped visit a year ago because of insurance issues.  [facility] Comprehensive ILD Questionnaire  Symptoms: Reports insidious onset of shortness of breath for the last few to several years.  He does the same since onset.  She also reports its episodic dyspnea.  SYMPTOM SCALE - ILD 09/04/2019   O2 use ra  Shortness of Breath 0 -> 5 scale with 5 being worst (score 6 If unable to do)  At rest 0  Simple tasks - showers, clothes change, eating, shaving 3  Household (dishes, doing bed, laundry) 3  Shopping 3  Walking level at own pace 3  Walking  keeping up with others of same age 66  Walking up Stairs 4.5  Walking up Hill 4.5  Total (40 - 48) Dyspnea Score x  How bad is your cough? x  How bad is your fatigue x    Her activities are limited by joint pain.  She also reports a cough it is the same since it started.  It is mild in intensity.  Occasionally brings out clear white phlegm.  It does affect her voice and she does clear her throat and she does feel a tickle in her throat.  No hemoptysis.    Past Medical History : Positive for diabetes blood clots not otherwise specified but negative for asthma, COPD, heart failure, rheumatoid arthritis, scleroderma, lupus, polymyositis, Sjogren's, GERD or hiatal hernia, sleep apnea, pulmonary hypertension, stroke.  No seizures.  Denies any heart disease or pleurisy   ROS: Positive for fatigue for the last few years and arthralgia for the last few years.  Also dry mouth for the last few years.  Does admit to snoring for unclear amount of time.  Has nonspecific rash for the last few weeks.  Denies any dysphagia or acid reflux or weight loss or ulcers in the mouth or vagina.   FAMILY HISTORY of LUNG DISEASE: Denies any COPD or pulmonary fibrosis or hypersensitivity pneumonitis  EXPOSURE HISTORY: Continues to smoke cigarettes since age 89.  He currently smokes 10 cigarettes a day.  She smokes cigars in the past.  Never smoked.  No electronic cigarette use.  No marijuana use currently but did smoke marijuana 20 years ago.  No cocaine use.  No intravenous drug use.   HOME and HOBBY DETAILS : Lives in a single-family [place] home for the last 34 years.  Extensive organic antigen exposure in the house history is negative.  In particular noted to have environment.  No more than will be on the shower curtain.  No mold or mildew anywhere in the house.  Does use a humidifier but no more than it.  Does not use a steam iron.  Does not have a Jacuzzi of swimming pool.  No misting found inside the house.  No  pet birds or parakeets.  No pet gerbils.  Does not use feather pillows.  Does not play any wind instruments.  Does not do much gardening.   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  Has worked as a Theme park manager.  Has worked as a Insurance claims handler with cosmetics.  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  Details are not known.  She has worked opening In jars of relaxer chemicals and during this time was exposed with his gas and fumes.  Rest are negative.   PULMONARY TOXICITY HISTORY (27 items):  -Denies         10/23/2019 Follow up : ILD , O2 RF  Patient returns for a 69-monthfollow-up.  She was seen for pulmonary consult last visit for possible pulmonary fibrosis noted on screening CT chest.  Patient is currently an active smoker.  Pulmonary function testing done today shows no airflow obstruction or restriction.  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  Moderate emphysema.  Dilation of pulmonary trunk concerning for pulmonary artery hypertension.  And aortic atherosclerosis. Overnight oximetry test 09/19/19/2020 showed nocturnal desaturations.  Patient was started on oxygen 2 L at nighttime.   Feels that since starting oxygen she is feeling better especially in the morning hours. She says she does get short of breath with activity.  But remains active and is able to do all of her housework and cooking without any difficulty.  Says she does occasionally have to stop and rest.  She does have some cough that is minimally productive. We discussed her test results.  And recent CT chest findings. She does not want to begin meds at this time, did discuss that she would discuss later dater if necessay but does not want to .   We discussed smoking cessation.  Declines pharm referral for smoking cessation   No Known Allergies  Study  Conclusions   - Left ventricle: The cavity size was normal. Wall thickness was  normal. Systolic function was normal. The estimated ejection  fraction was in the range of 60% to 65%. Wall motion was normal;  there were no regional wall motion abnormalities. Normal GLS at  -18%. Doppler parameters are consistent with abnormal left  ventricular relaxation (grade 1 diastolic dysfunction). The E/e&'  ratio is >15, suggesting elevated LV filling pressure.  - Aortic valve: Trileaflet. Sclerosis without stenosis. There was  mild to moderate regurgitation.  - Mitral valve: Mildly thickened leaflets . There was mild to  moderate regurgitation.  - Left atrium: Moderately  dilated.  - Tricuspid valve: There was moderate regurgitation.  - Pulmonary arteries: PA peak pressure: 48 mm Hg (S).  - Inferior vena cava: The vessel was normal in size. The  respirophasic diameter changes were in the normal range (>= 50%),  consistent with normal central venous pressure.   Impressions:   - Compared to a prior study in 2017, there are few changes. There  is mild to moderate AI, MR and moderate TR with a stable RVSP of  48 mmHg.    ROS - per HPI   OV 02/25/2020  Subjective:  Patient ID: Elizabeth Melton, female , DOB: 1941-02-22 , age 65 y.o. , MRN: [id number] , ADDRESS: [address] [place] [address]   02/25/2020 -   Chief Complaint  Patient presents with  . 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 -presents for follow-up.  Last visit with me was September 2020.  In the interim followed up with nurse practitioner nov 2020.  Initially was given nighttime  oxygen.  She said it helped but later she is now saying it did not help.  So she discontinued it.  She says it has made no difference.  In the interim she is continue to smoke.  She has significant amount of dyspnea burden as documented below she is also very fatigued.  However she is more bothered right now by her left knee pain and left ankle pain.  She is uses a cane chronically but the pain in her left ankle is been present only for a few days.  She got a talk about this with her primary care physician.  She continues to smoke.  She is not on any inhalers for emphysema.  At last visit with nurse practitioner she was reluctant to take antifibrotic's.     OV 11/22/2020   Subjective:  Patient ID: Elizabeth Melton, female , DOB: 07-09-1941, age 72 y.o. years. , MRN: [id number],  ADDRESS: [place] [id number] PCP  Leighton Ruff, MD Providers : Treatment Team:  Attending Provider: Brand Males, MD Patient Care Team: Leighton Ruff, MD as PCP - General (Family Medicine) Dorothy Spark, MD as PCP - Cardiology (Cardiology)    Chief Complaint  Patient presents with  . Follow-up    Breathing is unchanged since her last visit.     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 -returns for her ILD follow-up.  Last seen in spring 2021.  After that a try to set her up for a right heart catheterization  but in the interim she got pulmonary embolism in September 2021.  She has seen Dr. Jenetta Downer for sleep apnea work-up.  She continues to smoke.  For associated emphysema she takes Spiriva.  For her ILD she continues to be reluctant to have any biopsy or take antifibrotic's.  She expresses again to me today.  She does not want a right heart catheterization either.  On the other hand she is willing to have an expectant follow-up with serial monitoring.  This is because she is afraid of side effects.  Overall she is stable.  Not using oxygen.  She is more limited by sciatic pain in her feet.  This was evidenced in a walking desaturation test today.  Symptom scores are listed below.  In terms of pulmonary embolism: We discussed potential risk factors.  She denies any family history but it appears obesity sedentary lifestyle pulmonary fibrosis and ongoing smoking or risk factors.  I counseled her about quitting smoking.  She plans to do that.  SYMPTOM SCALE - ILD 02/25/2020  11/22/2020   O2 use ra ra  Shortness of Breath 0 -> 5 scale with 5 being worst (score 6 If unable to do)   At rest 1.5 0  Simple tasks - showers, clothes change, eating, shaving 3 5  Household (dishes, doing bed, laundry) 2.5 5  Shopping 3.5 3  Walking level at own pace 3.5 3  Walking up Stairs 4.5 5  Total (30-36) Dyspnea Score 18.5 21  How bad is your cough? 0 0  How bad is your fatigue 2.5 3  How bad is nausea 0 0  How bad is vomiting?  0 0  How bad is diarrhea? 0 2  How bad is anxiety? 0 2  How bad is depression 0 2         Simple office walk 185 feet x  3 laps goal with forehead probe 09/04/2019  02/25/2020  11/22/2020   O2 used RA ra ra  Number laps completed 3 attempted but at stopped at 1 due to dyspnea an left knee pain Did  not walk due to left heel pain. Has cane Did only 1 lap - stopped due to feet burn  Comments about pace slow    Resting Pulse Ox/HR 99% and 66/min  97% and 77/min  Final Pulse Ox/HR 93% and 105/min  94% and 105/min  Desaturated </= 88% No but did not do all 3  No,  Desaturated <= 3% points yes  Uyes, 3 points  Got Tachycardic >/= 90/min Yes, 6 points  yes  Symptoms at end of test Dyspnea and left knee pain  Stopped due to feet pain and some dyspnea but mostly feet  Miscellaneous comments Stopped early due to knee pain and dyspnea       PFT Results Latest Ref Rng & Units 03/17/2020 10/23/2019  FVC-Pre L 2.15 2.10  FVC-Predicted Pre % 103 100  Pre FEV1/FVC % % 74 73  FEV1-Pre L 1.59 1.53  FEV1-Predicted Pre % 99 95  DLCO uncorrected ml/min/mmHg 8.93 8.62  DLCO UNC% % 47 46  DLCO corrected ml/min/mmHg 8.93 -  DLCO COR %Predicted % 47 -  DLVA Predicted % 72 59     Results for Elizabeth Melton, Elizabeth Melton (MRN [id number]) as of 02/25/2020 12:29  Ref. Range 09/04/2019 14:19  CK Total Latest Ref Range: 29 - 143 U/L 196 (H)   Results for Elizabeth Melton, Elizabeth Melton (MRN [id number]) as of 02/25/2020 12:29  Ref. Range 09/04/2019 14:19  Anti Nuclear Antibody (ANA) Latest Ref Range: NEGATIVE  POSITIVE (A)  ANA Pattern 1 Unknown Cytoplasmic (A)  ANA Titer 1 Latest Units: titer 1:80 (H)  Results for Elizabeth Melton, Elizabeth Melton (MRN [id number]) as of 02/25/2020 12:29  Ref. Range 09/04/2019 14:19  Sed Rate Latest Ref Range: 0 - 30 mm/hr 40 (H)       has a past medical history of Diabetes mellitus without complication ([facility]), Hyperlipidemia, Hypothyroidism, and Thrombocytopenia ([facility]).   reports that she has been smoking cigarettes. She has a 45.00 pack-year smoking history. She quit smokeless tobacco use about 58 years ago.  Past Surgical History:  Procedure Laterality Date  . TUBAL LIGATION      No Known Allergies   There is no immunization history on file for this patient.  Family History  Problem Relation Age  of Onset  . Diabetes Mother   . Kidney disease Mother   . Hypertension Mother   . Other Father        tuberculosis  . Hypertension Sister   . Hyperlipidemia Sister      Current Outpatient Medications:  .  acetaminophen (TYLENOL) 500 MG tablet, Take 500 mg by mouth daily as needed for headache (pain). , Disp: , Rfl:  .  apixaban (ELIQUIS) 5 MG TABS tablet, Take 1 tablet (5 mg total) by mouth 2 (two) times daily., Disp: 60 tablet, Rfl: 1 .  b complex vitamins tablet, Take 1 tablet by mouth daily., Disp: , Rfl:  .  Calcium Carb-Cholecalciferol (CALCIUM + D3) 600-200 MG-UNIT TABS, Take 2 tablets by mouth daily. , Disp: , Rfl:  .  cholecalciferol (VITAMIN D3) 25 MCG (1000 UT) tablet, Take 1,000 Units by mouth daily., Disp: , Rfl:  .  glucose blood test strip, 1 each by Other route as needed. Use as instructed, Disp: , Rfl:  .  levothyroxine (SYNTHROID, LEVOTHROID) 75 MCG tablet, Take 75 mcg by mouth daily before breakfast. , Disp: , Rfl:  .  lovastatin (MEVACOR) 40 MG tablet, Take 40 mg by mouth at bedtime., Disp: , Rfl:  .  nicotine (NICODERM CQ - DOSED IN MG/24 HOURS) 21 mg/24hr patch, Place 21 mg onto the skin daily as needed (smoking cessation)., Disp: , Rfl:  .  Omega-3 Fatty Acids (FISH OIL PO), Take 1 capsule by mouth daily., Disp: , Rfl:  .  SMART SENSE THIN LANCETS 26G MISC, by Does not apply route., Disp: , Rfl:  .  Tiotropium Bromide-Olodaterol (STIOLTO RESPIMAT) 2.5-2.5 MCG/ACT AERS, Inhale 2 puffs into the lungs daily., Disp: 1 each, Rfl: 1      Objective:   Vitals:   11/22/20 1157  BP: 108/72  Pulse: 77  Temp: (!) 97.2 F (36.2 C)  TempSrc: Temporal  SpO2: 97%  Weight: 177 lb 6.4 oz (80.5 kg)  Height: _0  (1.676 m)    Estimated body mass index is 28.63 kg/m as calculated from the following:   Height as of this encounter: _1  (1.676 m).   Weight as of this encounter: 177 lb 6.4 oz (80.5 kg).  _2 @  Filed Weights   11/22/20 1157  Weight: 177 lb  6.4 oz (80.5 kg)     Physical Exam  General: No distress. obese Neuro: Alert and Oriented x 3. GCS 15. Speech normal Psych: Pleasant Resp:  Barrel Chest - no.  Wheeze - no, Crackles - yes fine at base, No overt respiratory distress CVS: Normal heart sounds. Murmurs - no Ext: Stigmata of Connective Tissue Disease - no  HEENT: Normal upper airway. PEERL +. No post nasal drip        Assessment:       ICD-10-CM   1. ILD (interstitial lung disease) ([facility])  J84.9   2. Moderate cigarette smoker (10-19 per day)  F17.210   3. Solitary pulmonary nodule  R91.1   4. 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 WHICH CAN BE PROGRESSIVE  Plan -Respect your reluctance to undergo biopsy procedures or start antifibrotic's -Respective position to adopt an approach of serial monitoring and reassess the need for antifibrotic's  -Do high-resolution CT chest in  3 months  -Do spirometry and DLCO in 3 months  Pulmonary emphysema   Stable disease  Plan -Continue Spiriva   Pulmonary artery enlargement on CT  Pulmonary embolism September 2021  -Heart catheterization held off in August/September 2021 due to blood clot in the lung in September 2021  Plan- -continue Eliquis -Hold off on right heart catheterization for the moment   Followup   - J 3-4 months but after completing the above; Dr. RChase Callerin 30-minute slot     SIGNATURE    Dr. MBrand Males M.D., F.C.C.P,  Pulmonary and Critical Care Medicine Staff Physician, [facility] Director - Interstitial Lung Disease  Program  [place] [id number] Pager: [phone] If no answer or between  15:00h - 7:00h: call [phone] Telephone: [phone]  12:26 PM 11/22/2020

## 2020-12-30 ENCOUNTER — Other Ambulatory Visit (HOSPITAL_COMMUNITY): Payer: Medicare HMO

## 2021-01-04 DIAGNOSIS — E119 Type 2 diabetes mellitus without complications: Secondary | ICD-10-CM | POA: Diagnosis not present

## 2021-01-04 DIAGNOSIS — J449 Chronic obstructive pulmonary disease, unspecified: Secondary | ICD-10-CM | POA: Diagnosis not present

## 2021-01-04 DIAGNOSIS — E1169 Type 2 diabetes mellitus with other specified complication: Secondary | ICD-10-CM | POA: Diagnosis not present

## 2021-01-04 DIAGNOSIS — I10 Essential (primary) hypertension: Secondary | ICD-10-CM | POA: Diagnosis not present

## 2021-01-04 DIAGNOSIS — E1165 Type 2 diabetes mellitus with hyperglycemia: Secondary | ICD-10-CM | POA: Diagnosis not present

## 2021-01-04 DIAGNOSIS — M810 Age-related osteoporosis without current pathological fracture: Secondary | ICD-10-CM | POA: Diagnosis not present

## 2021-01-04 DIAGNOSIS — E039 Hypothyroidism, unspecified: Secondary | ICD-10-CM | POA: Diagnosis not present

## 2021-01-04 DIAGNOSIS — E785 Hyperlipidemia, unspecified: Secondary | ICD-10-CM | POA: Diagnosis not present

## 2021-01-06 ENCOUNTER — Other Ambulatory Visit: Payer: Self-pay | Admitting: Internal Medicine

## 2021-01-07 ENCOUNTER — Ambulatory Visit (HOSPITAL_COMMUNITY)
Admission: RE | Admit: 2021-01-07 | Discharge: 2021-01-07 | Disposition: A | Discharge status: Home / Self Care | Payer: Medicare HMO | Source: Ambulatory Visit | Attending: Pulmonary Disease | Admitting: Pulmonary Disease

## 2021-01-07 ENCOUNTER — Other Ambulatory Visit: Payer: Self-pay

## 2021-01-07 DIAGNOSIS — I270 Primary pulmonary hypertension: Secondary | ICD-10-CM

## 2021-01-07 DIAGNOSIS — I10 Essential (primary) hypertension: Secondary | ICD-10-CM | POA: Insufficient documentation

## 2021-01-07 DIAGNOSIS — I272 Pulmonary hypertension, unspecified: Secondary | ICD-10-CM | POA: Diagnosis not present

## 2021-01-07 DIAGNOSIS — E119 Type 2 diabetes mellitus without complications: Secondary | ICD-10-CM | POA: Diagnosis not present

## 2021-01-07 DIAGNOSIS — F17200 Nicotine dependence, unspecified, uncomplicated: Secondary | ICD-10-CM | POA: Diagnosis not present

## 2021-01-07 DIAGNOSIS — E785 Hyperlipidemia, unspecified: Secondary | ICD-10-CM | POA: Diagnosis not present

## 2021-01-07 LAB — ECHOCARDIOGRAM COMPLETE
AR max vel: 1.33 cm2
AV Area VTI: 1.71 cm2
AV Area mean vel: 1.46 cm2
AV Mean grad: 8 mmHg
AV Peak grad: 18.8 mmHg
Ao pk vel: 2.17 m/s
Area-P 1/2: 4.31 cm2
MV M vel: 5.4 m/s
MV Peak grad: 116.6 mmHg
P 1/2 time: 298 msec
S' Lateral: 3.1 cm

## 2021-01-07 NOTE — Telephone Encounter (Signed)
Received refill request from [facility]  Medication name/strength/dose: Stiolto 2.5 mcg Medication last rx'd: 10/19/2020 Quantity and number of refills last rx'd: 1 Instructions: Inhale 2 puffs into lungs daily  Last OV: 11/22/2020 Next OV: recall placed  Dr [name] please advise on refill request. In your OV it states to continue spiriva, there isn't anything about whether to continue Stiolto.  No Known Allergies Current Outpatient Medications on File Prior to Visit  Medication Sig Dispense Refill  . acetaminophen (TYLENOL) 500 MG tablet Take 500 mg by mouth daily as needed for headache (pain).     Marland Kitchen apixaban (ELIQUIS) 5 MG TABS tablet Take 1 tablet (5 mg total) by mouth 2 (two) times daily. 60 tablet 1  . b complex vitamins tablet Take 1 tablet by mouth daily.    . Calcium Carb-Cholecalciferol (CALCIUM + D3) 600-200 MG-UNIT TABS Take 2 tablets by mouth daily.     . cholecalciferol (VITAMIN D3) 25 MCG (1000 UT) tablet Take 1,000 Units by mouth daily.    Marland Kitchen glucose blood test strip 1 each by Other route as needed. Use as instructed    . levothyroxine (SYNTHROID, LEVOTHROID) 75 MCG tablet Take 75 mcg by mouth daily before breakfast.     . lovastatin (MEVACOR) 40 MG tablet Take 40 mg by mouth at bedtime.    . nicotine (NICODERM CQ - DOSED IN MG/24 HOURS) 21 mg/24hr patch Place 21 mg onto the skin daily as needed (smoking cessation).    . Omega-3 Fatty Acids (FISH OIL PO) Take 1 capsule by mouth daily.    Marland Kitchen SMART SENSE THIN LANCETS 26G MISC by Does not apply route.    . Tiotropium Bromide-Olodaterol (STIOLTO RESPIMAT) 2.5-2.5 MCG/ACT AERS Inhale 2 puffs into the lungs daily. 1 each 1   No current facility-administered medications on file prior to visit.

## 2021-01-07 NOTE — Progress Notes (Signed)
*  PRELIMINARY RESULTS* Echocardiogram 2D Echocardiogram has been performed.  Elizabeth Melton 01/07/2021, 11:32 AM

## 2021-01-13 ENCOUNTER — Telehealth: Payer: Self-pay | Admitting: Internal Medicine

## 2021-01-13 DIAGNOSIS — Z5181 Encounter for therapeutic drug level monitoring: Secondary | ICD-10-CM

## 2021-01-13 NOTE — Telephone Encounter (Signed)
Patient called Let her know Dr. Golden Pop recommendations.  She is going to see AO on 01/20/21. She will go to [facility] on Monday to have labs drawn per MR.  Patient wrote down all appointments Nothing further needed at this time.

## 2021-01-13 NOTE — Telephone Encounter (Signed)
Patient reports having no energy, stuffy head, left side of her face feels swollen, no appetite, black stools.   Her symptoms started months ago. She has been taking blood thinner since October 2021 for PE, first time she took it she experienced side effects. Each day it seems to be getting worse.  Instructed her to hold Eliquis. Please advise on further recommendations

## 2021-01-13 NOTE — Telephone Encounter (Signed)
Plan -Stop Eliquis.  Could be Eliquis side effect although I have not come across this and it could be a rare side effect it is certainly possible.  -Check D-dimer in the next few to several days  -Give nurse practitioner appointment in the next few to several days to a week/10 days.  She will have to come in to discuss options because the side effects could be a class effect or specific to Eliquis.  Regardless we need to try as much as we can to put her on anticoagulation because it was a significant amount of pulmonary embolism only a few months ago for which she was admitted

## 2021-01-16 ENCOUNTER — Emergency Department (HOSPITAL_COMMUNITY): Payer: Medicare HMO

## 2021-01-16 ENCOUNTER — Other Ambulatory Visit: Payer: Self-pay

## 2021-01-16 ENCOUNTER — Inpatient Hospital Stay (HOSPITAL_COMMUNITY)
Admission: EM | Admit: 2021-01-16 | Discharge: 2021-01-27 | DRG: 177 | Disposition: A | Discharge status: Home Health | Payer: Medicare HMO | Attending: Internal Medicine | Admitting: Internal Medicine

## 2021-01-16 ENCOUNTER — Encounter (HOSPITAL_COMMUNITY): Payer: Self-pay

## 2021-01-16 DIAGNOSIS — R197 Diarrhea, unspecified: Secondary | ICD-10-CM | POA: Diagnosis not present

## 2021-01-16 DIAGNOSIS — E1169 Type 2 diabetes mellitus with other specified complication: Secondary | ICD-10-CM

## 2021-01-16 DIAGNOSIS — F17210 Nicotine dependence, cigarettes, uncomplicated: Secondary | ICD-10-CM | POA: Diagnosis present

## 2021-01-16 DIAGNOSIS — D696 Thrombocytopenia, unspecified: Secondary | ICD-10-CM | POA: Diagnosis present

## 2021-01-16 DIAGNOSIS — I350 Nonrheumatic aortic (valve) stenosis: Secondary | ICD-10-CM | POA: Diagnosis not present

## 2021-01-16 DIAGNOSIS — Z79890 Hormone replacement therapy (postmenopausal): Secondary | ICD-10-CM

## 2021-01-16 DIAGNOSIS — Z83438 Family history of other disorder of lipoprotein metabolism and other lipidemia: Secondary | ICD-10-CM | POA: Diagnosis not present

## 2021-01-16 DIAGNOSIS — R42 Dizziness and giddiness: Secondary | ICD-10-CM | POA: Diagnosis present

## 2021-01-16 DIAGNOSIS — I361 Nonrheumatic tricuspid (valve) insufficiency: Secondary | ICD-10-CM | POA: Diagnosis not present

## 2021-01-16 DIAGNOSIS — R0602 Shortness of breath: Secondary | ICD-10-CM | POA: Diagnosis not present

## 2021-01-16 DIAGNOSIS — R079 Chest pain, unspecified: Secondary | ICD-10-CM | POA: Diagnosis not present

## 2021-01-16 DIAGNOSIS — R918 Other nonspecific abnormal finding of lung field: Secondary | ICD-10-CM | POA: Diagnosis present

## 2021-01-16 DIAGNOSIS — J9601 Acute respiratory failure with hypoxia: Secondary | ICD-10-CM | POA: Diagnosis present

## 2021-01-16 DIAGNOSIS — I083 Combined rheumatic disorders of mitral, aortic and tricuspid valves: Secondary | ICD-10-CM | POA: Diagnosis present

## 2021-01-16 DIAGNOSIS — E11649 Type 2 diabetes mellitus with hypoglycemia without coma: Secondary | ICD-10-CM | POA: Diagnosis present

## 2021-01-16 DIAGNOSIS — R7989 Other specified abnormal findings of blood chemistry: Secondary | ICD-10-CM | POA: Diagnosis not present

## 2021-01-16 DIAGNOSIS — E785 Hyperlipidemia, unspecified: Secondary | ICD-10-CM | POA: Diagnosis present

## 2021-01-16 DIAGNOSIS — U071 COVID-19: Secondary | ICD-10-CM | POA: Diagnosis not present

## 2021-01-16 DIAGNOSIS — E119 Type 2 diabetes mellitus without complications: Secondary | ICD-10-CM

## 2021-01-16 DIAGNOSIS — I340 Nonrheumatic mitral (valve) insufficiency: Secondary | ICD-10-CM | POA: Diagnosis not present

## 2021-01-16 DIAGNOSIS — K5900 Constipation, unspecified: Secondary | ICD-10-CM | POA: Diagnosis present

## 2021-01-16 DIAGNOSIS — R001 Bradycardia, unspecified: Secondary | ICD-10-CM | POA: Diagnosis present

## 2021-01-16 DIAGNOSIS — I2692 Saddle embolus of pulmonary artery without acute cor pulmonale: Secondary | ICD-10-CM | POA: Diagnosis present

## 2021-01-16 DIAGNOSIS — I10 Essential (primary) hypertension: Secondary | ICD-10-CM | POA: Diagnosis present

## 2021-01-16 DIAGNOSIS — Z7901 Long term (current) use of anticoagulants: Secondary | ICD-10-CM

## 2021-01-16 DIAGNOSIS — I429 Cardiomyopathy, unspecified: Secondary | ICD-10-CM | POA: Diagnosis not present

## 2021-01-16 DIAGNOSIS — Z79899 Other long term (current) drug therapy: Secondary | ICD-10-CM | POA: Diagnosis not present

## 2021-01-16 DIAGNOSIS — E039 Hypothyroidism, unspecified: Secondary | ICD-10-CM | POA: Diagnosis not present

## 2021-01-16 DIAGNOSIS — Z86711 Personal history of pulmonary embolism: Secondary | ICD-10-CM | POA: Diagnosis not present

## 2021-01-16 DIAGNOSIS — J1282 Pneumonia due to coronavirus disease 2019: Secondary | ICD-10-CM

## 2021-01-16 DIAGNOSIS — I351 Nonrheumatic aortic (valve) insufficiency: Secondary | ICD-10-CM | POA: Diagnosis not present

## 2021-01-16 DIAGNOSIS — K449 Diaphragmatic hernia without obstruction or gangrene: Secondary | ICD-10-CM | POA: Diagnosis not present

## 2021-01-16 DIAGNOSIS — I959 Hypotension, unspecified: Secondary | ICD-10-CM | POA: Diagnosis present

## 2021-01-16 DIAGNOSIS — I4891 Unspecified atrial fibrillation: Secondary | ICD-10-CM | POA: Diagnosis not present

## 2021-01-16 DIAGNOSIS — Z8249 Family history of ischemic heart disease and other diseases of the circulatory system: Secondary | ICD-10-CM

## 2021-01-16 DIAGNOSIS — R768 Other specified abnormal immunological findings in serum: Secondary | ICD-10-CM | POA: Diagnosis present

## 2021-01-16 DIAGNOSIS — J849 Interstitial pulmonary disease, unspecified: Secondary | ICD-10-CM | POA: Diagnosis not present

## 2021-01-16 DIAGNOSIS — I517 Cardiomegaly: Secondary | ICD-10-CM | POA: Diagnosis not present

## 2021-01-16 DIAGNOSIS — I248 Other forms of acute ischemic heart disease: Secondary | ICD-10-CM | POA: Diagnosis not present

## 2021-01-16 DIAGNOSIS — E1165 Type 2 diabetes mellitus with hyperglycemia: Secondary | ICD-10-CM

## 2021-01-16 DIAGNOSIS — R319 Hematuria, unspecified: Secondary | ICD-10-CM | POA: Diagnosis not present

## 2021-01-16 DIAGNOSIS — Z833 Family history of diabetes mellitus: Secondary | ICD-10-CM | POA: Diagnosis not present

## 2021-01-16 DIAGNOSIS — R5383 Other fatigue: Secondary | ICD-10-CM | POA: Diagnosis not present

## 2021-01-16 HISTORY — DX: Pneumonia due to coronavirus disease 2019: J12.82

## 2021-01-16 HISTORY — DX: Essential (primary) hypertension: I10

## 2021-01-16 LAB — COMPREHENSIVE METABOLIC PANEL
ALT: 37 U/L (ref 0–44)
AST: 82 U/L — ABNORMAL HIGH (ref 15–41)
Albumin: 3.1 g/dL — ABNORMAL LOW (ref 3.5–5.0)
Alkaline Phosphatase: 54 U/L (ref 38–126)
Anion gap: 10 (ref 5–15)
BUN: 14 mg/dL (ref 8–23)
CO2: 27 mmol/L (ref 22–32)
Calcium: 8.2 mg/dL — ABNORMAL LOW (ref 8.9–10.3)
Chloride: 101 mmol/L (ref 98–111)
Creatinine, Ser: 0.67 mg/dL (ref 0.44–1.00)
GFR, Estimated: >60 mL/min (ref >60)
Glucose, Bld: 130 mg/dL — ABNORMAL HIGH (ref 70–99)
Potassium: 4 mmol/L (ref 3.5–5.1)
Sodium: 138 mmol/L (ref 135–145)
Total Bilirubin: 0.9 mg/dL (ref 0.3–1.2)
Total Protein: 7.4 g/dL (ref 6.5–8.1)

## 2021-01-16 LAB — CBC WITH DIFFERENTIAL/PLATELET
Abs Immature Granulocytes: 0.02 10*3/uL (ref 0.00–0.07)
Basophils Absolute: 0 10*3/uL (ref 0.0–0.1)
Basophils Relative: 0 %
Eosinophils Absolute: 0 10*3/uL (ref 0.0–0.5)
Eosinophils Relative: 0 %
HCT: 42.9 % (ref 36.0–46.0)
Hemoglobin: 14.4 g/dL (ref 12.0–15.0)
Immature Granulocytes: 1 %
Lymphocytes Relative: 43 %
Lymphs Abs: 1.3 10*3/uL (ref 0.7–4.0)
MCH: 33 pg (ref 26.0–34.0)
MCHC: 33.6 g/dL (ref 30.0–36.0)
MCV: 98.2 fL (ref 80.0–100.0)
Monocytes Absolute: 0.2 10*3/uL (ref 0.1–1.0)
Monocytes Relative: 8 %
Neutro Abs: 1.4 10*3/uL — ABNORMAL LOW (ref 1.7–7.7)
Neutrophils Relative %: 48 %
Platelets: 96 10*3/uL — ABNORMAL LOW (ref 150–400)
RBC: 4.37 MIL/uL (ref 3.87–5.11)
RDW: 14.4 % (ref 11.5–15.5)
WBC: 2.9 10*3/uL — ABNORMAL LOW (ref 4.0–10.5)
nRBC: 2 % — ABNORMAL HIGH (ref 0.0–0.2)

## 2021-01-16 LAB — CBC
HCT: 48.2 % — ABNORMAL HIGH (ref 36.0–46.0)
Hemoglobin: 15.7 g/dL — ABNORMAL HIGH (ref 12.0–15.0)
MCH: 32.6 pg (ref 26.0–34.0)
MCHC: 32.6 g/dL (ref 30.0–36.0)
MCV: 100.2 fL — ABNORMAL HIGH (ref 80.0–100.0)
Platelets: 107 10*3/uL — ABNORMAL LOW (ref 150–400)
RBC: 4.81 MIL/uL (ref 3.87–5.11)
RDW: 14.5 % (ref 11.5–15.5)
WBC: 3.8 10*3/uL — ABNORMAL LOW (ref 4.0–10.5)
nRBC: 1.9 % — ABNORMAL HIGH (ref 0.0–0.2)

## 2021-01-16 LAB — URINALYSIS, ROUTINE W REFLEX MICROSCOPIC
Bacteria, UA: NONE SEEN
Bilirubin Urine: NEGATIVE
Glucose, UA: 50 mg/dL — AB
Hgb urine dipstick: NEGATIVE
Ketones, ur: 5 mg/dL — AB
Leukocytes,Ua: NEGATIVE
Nitrite: NEGATIVE
Protein, ur: >=300 mg/dL — AB
Specific Gravity, Urine: 1.02 (ref 1.005–1.030)
pH: 5 (ref 5.0–8.0)

## 2021-01-16 LAB — CBG MONITORING, ED: Glucose-Capillary: 108 mg/dL — ABNORMAL HIGH (ref 70–99)

## 2021-01-16 LAB — LACTATE DEHYDROGENASE: LDH: 495 U/L — ABNORMAL HIGH (ref 98–192)

## 2021-01-16 LAB — FIBRINOGEN: Fibrinogen: 452 mg/dL (ref 210–475)

## 2021-01-16 LAB — LACTIC ACID, PLASMA
Lactic Acid, Venous: 1.8 mmol/L (ref 0.5–1.9)
Lactic Acid, Venous: 1.4 mmol/L (ref 0.5–1.9)

## 2021-01-16 LAB — TROPONIN I (HIGH SENSITIVITY)
Troponin I (High Sensitivity): 13 ng/L (ref <18)
Troponin I (High Sensitivity): 13 ng/L (ref <18)

## 2021-01-16 LAB — D-DIMER, QUANTITATIVE: D-Dimer, Quant: 2.76 ug/mL-FEU — ABNORMAL HIGH (ref 0.00–0.50)

## 2021-01-16 LAB — C-REACTIVE PROTEIN: CRP: 3.5 mg/dL — ABNORMAL HIGH (ref <1.0)

## 2021-01-16 LAB — BRAIN NATRIURETIC PEPTIDE: B Natriuretic Peptide: 115 pg/mL — ABNORMAL HIGH (ref 0.0–100.0)

## 2021-01-16 LAB — PROCALCITONIN: Procalcitonin: 0.1 ng/mL

## 2021-01-16 LAB — SARS CORONAVIRUS 2 BY RT PCR (HOSPITAL ORDER, PERFORMED IN ~~LOC~~ HOSPITAL LAB): SARS Coronavirus 2: POSITIVE — AB

## 2021-01-16 LAB — TSH: TSH: 2.081 u[IU]/mL (ref 0.350–4.500)

## 2021-01-16 LAB — FERRITIN: Ferritin: 3688 ng/mL — ABNORMAL HIGH (ref 11–307)

## 2021-01-16 LAB — TRIGLYCERIDES: Triglycerides: 95 mg/dL (ref <150)

## 2021-01-16 LAB — GLUCOSE, CAPILLARY
Glucose-Capillary: 117 mg/dL — ABNORMAL HIGH (ref 70–99)
Glucose-Capillary: 318 mg/dL — ABNORMAL HIGH (ref 70–99)

## 2021-01-16 MED ORDER — SODIUM CHLORIDE 0.9 % IV SOLN
100.0000 mg | Freq: Every day | INTRAVENOUS | Status: AC
  Administered 2021-01-17 – 2021-01-20 (×4): 100 mg via INTRAVENOUS
  Filled 2021-01-16 (×4): qty 20

## 2021-01-16 MED ORDER — ACETAMINOPHEN 650 MG RE SUPP: 650.0000 mg | Freq: Four times a day (QID) | RECTAL | Status: DC | PRN

## 2021-01-16 MED ORDER — INSULIN ASPART 100 UNIT/ML ~~LOC~~ SOLN
0.0000 [IU] | Freq: Every day | SUBCUTANEOUS | Status: DC
  Administered 2021-01-16: 4 [IU] via SUBCUTANEOUS
  Administered 2021-01-17: 3 [IU] via SUBCUTANEOUS
  Filled 2021-01-16: qty 0.05

## 2021-01-16 MED ORDER — UMECLIDINIUM BROMIDE 62.5 MCG/INH IN AEPB
1.0000 | INHALATION_SPRAY | Freq: Every day | RESPIRATORY_TRACT | Status: DC
  Administered 2021-01-16 – 2021-01-27 (×9): 1 via RESPIRATORY_TRACT
  Filled 2021-01-16 (×2): qty 7

## 2021-01-16 MED ORDER — IPRATROPIUM-ALBUTEROL 20-100 MCG/ACT IN AERS
1.0000 | INHALATION_SPRAY | Freq: Four times a day (QID) | RESPIRATORY_TRACT | Status: DC
  Administered 2021-01-16 – 2021-01-17 (×5): 1 via RESPIRATORY_TRACT
  Filled 2021-01-16: qty 4

## 2021-01-16 MED ORDER — SODIUM CHLORIDE 0.9 % IV SOLN
200.0000 mg | Freq: Once | INTRAVENOUS | Status: AC
  Administered 2021-01-16: 200 mg via INTRAVENOUS
  Filled 2021-01-16: qty 200

## 2021-01-16 MED ORDER — ASCORBIC ACID 500 MG PO TABS
500.0000 mg | ORAL_TABLET | Freq: Every day | ORAL | Status: DC
  Administered 2021-01-17 – 2021-01-27 (×11): 500 mg via ORAL
  Filled 2021-01-16 (×11): qty 1

## 2021-01-16 MED ORDER — ONDANSETRON HCL 4 MG/2ML IJ SOLN
4.0000 mg | Freq: Four times a day (QID) | INTRAMUSCULAR | Status: DC | PRN
  Administered 2021-01-21 – 2021-01-22 (×3): 4 mg via INTRAVENOUS
  Filled 2021-01-16 (×3): qty 2

## 2021-01-16 MED ORDER — ACETAMINOPHEN 325 MG PO TABS
650.0000 mg | ORAL_TABLET | Freq: Four times a day (QID) | ORAL | Status: DC | PRN
  Administered 2021-01-16 – 2021-01-19 (×3): 650 mg via ORAL
  Filled 2021-01-16 (×3): qty 2

## 2021-01-16 MED ORDER — APIXABAN 5 MG PO TABS
5.0000 mg | ORAL_TABLET | Freq: Two times a day (BID) | ORAL | Status: DC
  Administered 2021-01-16 – 2021-01-27 (×22): 5 mg via ORAL
  Filled 2021-01-16 (×23): qty 1

## 2021-01-16 MED ORDER — INSULIN ASPART 100 UNIT/ML ~~LOC~~ SOLN
0.0000 [IU] | Freq: Three times a day (TID) | SUBCUTANEOUS | Status: DC
  Administered 2021-01-17: 3 [IU] via SUBCUTANEOUS
  Administered 2021-01-17 – 2021-01-18 (×2): 2 [IU] via SUBCUTANEOUS
  Filled 2021-01-16: qty 0.06

## 2021-01-16 MED ORDER — GUAIFENESIN-DM 100-10 MG/5ML PO SYRP
10.0000 mL | ORAL_SOLUTION | ORAL | Status: DC | PRN
  Filled 2021-01-16 (×2): qty 10

## 2021-01-16 MED ORDER — HYDROCOD POLST-CPM POLST ER 10-8 MG/5ML PO SUER
5.0000 mL | Freq: Two times a day (BID) | ORAL | Status: DC | PRN
Start: 2021-01-16 — End: 2021-01-27

## 2021-01-16 MED ORDER — ONDANSETRON HCL 4 MG PO TABS: 4.0000 mg | ORAL_TABLET | Freq: Four times a day (QID) | ORAL | Status: DC | PRN

## 2021-01-16 MED ORDER — ZINC SULFATE 220 (50 ZN) MG PO CAPS
220.0000 mg | ORAL_CAPSULE | Freq: Every day | ORAL | Status: DC
  Administered 2021-01-16 – 2021-01-27 (×12): 220 mg via ORAL
  Filled 2021-01-16 (×12): qty 1

## 2021-01-16 MED ORDER — PRAVASTATIN SODIUM 20 MG PO TABS
40.0000 mg | ORAL_TABLET | Freq: Every day | ORAL | Status: DC
  Administered 2021-01-16 – 2021-01-23 (×7): 40 mg via ORAL
  Filled 2021-01-16 (×7): qty 2

## 2021-01-16 MED ORDER — LEVOTHYROXINE SODIUM 75 MCG PO TABS
75.0000 ug | ORAL_TABLET | Freq: Every day | ORAL | Status: DC
  Administered 2021-01-17 – 2021-01-27 (×11): 75 ug via ORAL
  Filled 2021-01-16 (×11): qty 1

## 2021-01-16 MED ORDER — OXYCODONE HCL 5 MG PO TABS
5.0000 mg | ORAL_TABLET | ORAL | Status: DC | PRN
Start: 2021-01-16 — End: 2021-01-27
  Administered 2021-01-17: 5 mg via ORAL
  Filled 2021-01-16 (×2): qty 1

## 2021-01-16 MED ORDER — IOHEXOL 350 MG/ML SOLN
100.0000 mL | Freq: Once | INTRAVENOUS | Status: AC | PRN
  Administered 2021-01-16: 100 mL via INTRAVENOUS

## 2021-01-16 MED ORDER — METHYLPREDNISOLONE SODIUM SUCC 125 MG IJ SOLR
1.0000 mg/kg | Freq: Two times a day (BID) | INTRAMUSCULAR | Status: DC
  Administered 2021-01-16 – 2021-01-18 (×4): 80.625 mg via INTRAVENOUS
  Filled 2021-01-16 (×4): qty 2

## 2021-01-16 NOTE — ED Notes (Signed)
Attempted to call and give report, will attempt again soon.

## 2021-01-16 NOTE — ED Triage Notes (Signed)
Pt reports SHOB and head congestion that began a few days ago. Pt also endorses intermittent dizziness x1 month. Pt O2 is 78-82% on RA. Placed on 5L in triage and SpO2 is now 97%. Pt denies hx of COPD and CHF. Pt also denies being vaccinated for COVID.

## 2021-01-16 NOTE — H&P (Signed)
History and Physical    Elizabeth Melton DGU:[id number] DOB: 07/06/1941 DOA: 01/16/2021  PCP: Kathyrn Lass, MD  Patient coming from: Home  I have personally briefly reviewed patient's old medical records in [place]  Chief Complaint: Shortness of breath  HPI: Elizabeth Melton is a 80 y.o. female with medical history significant of ILD (ANA positive) who was previously on nocturnal oxygen but has been off for some time, history of saddle PE recently stopped Eliquis, essential hypertension, hypothyroidism, hyperlipidemia, type 2 diabetes mellitus who presented to the ED with progressive shortness of breath and intermittent dizziness over the past week.  Patient initially thought it was related to her Eliquis, she contacted her pulmonologist who elected to have patient stop her anticoagulant.  Patient symptoms continue to progress now associated with decreased appetite, myalgias.  Patient denies any sick contacts, no recent travel and no other changes in medication regimen.  Patient specifically denies visual changes, no chest pain, no palpitations, no nausea/vomiting/diarrhea, no abdominal pain, no chills/night sweats, no paresthesias.  ED Course: Temperature 99.2, HR 58, RR 17, BP 158/55, SPO2 78% on room air.  WBC 3.8, hemoglobin 15.7, platelet 107.  Sodium 138, potassium 4.0, chloride 101, CO2 27, BUN 14, creatinine 0.67.  LDH 495, troponin XIII, ferritin 3688, CRP 3.5, lactic acid 1.8, procalcitonin 0.10.  D-dimer 2.76.  Covid-19/SARS-CoV-2 positive.  Blood cultures x2 ordered.  Chest x-ray with stable diffuse interstitial densities bilaterally.  CT angiogram chest pending.  Hospitalist service consulted for further evaluation and management of acute hypoxic respite failure secondary to Covid-19 viral pneumonia.  Review of Systems: As per HPI otherwise 10 point review of systems negative.    Past Medical History:  Diagnosis Date  . Diabetes mellitus without complication ([name])   .  Hyperlipidemia   . Hypertension   . Hypothyroidism   . Thrombocytopenia ([place])     Past Surgical History:  Procedure Laterality Date  . TUBAL LIGATION       reports that she has been smoking cigarettes. She has a 45.00 pack-year smoking history. She quit smokeless tobacco use about 58 years ago. She reports current alcohol use. She reports that she does not use drugs.  No Known Allergies  Family History  Problem Relation Age of Onset  . Diabetes Mother   . Kidney disease Mother   . Hypertension Mother   . Other Father        tuberculosis  . Hypertension Sister   . Hyperlipidemia Sister     Family history reviewed and not pertinent   Prior to Admission medications   Medication Sig Start Date End Date Taking? Authorizing Provider  acetaminophen (TYLENOL) 500 MG tablet Take 500 mg by mouth daily as needed for headache (pain).    Yes [provider]  apixaban (ELIQUIS) 5 MG TABS tablet Take 1 tablet (5 mg total) by mouth 2 (two) times daily. 10/11/20  Yes Mercy Riding, MD  b complex vitamins tablet Take 1 tablet by mouth daily.   Yes [provider]  Blood Glucose Monitoring Suppl (TRUE METRIX METER) w/Device KIT 1 each by Other route in the morning and at bedtime. 12/25/20  Yes [provider]  Calcium Carb-Cholecalciferol (CALCIUM + D3) 600-200 MG-UNIT TABS Take 2 tablets by mouth daily.    Yes [provider]  cholecalciferol (VITAMIN D3) 25 MCG (1000 UT) tablet Take 1,000 Units by mouth daily.   Yes [provider]  glucose blood test strip 1 each by Other  route as needed for other (blood sugar).   Yes [provider]  levothyroxine (SYNTHROID, LEVOTHROID) 75 MCG tablet Take 75 mcg by mouth daily before breakfast.    Yes [provider]  lovastatin (MEVACOR) 40 MG tablet Take 40 mg by mouth at bedtime.   Yes [provider]  Omega-3 Fatty Acids (FISH OIL PO) Take 1 capsule by mouth daily.   Yes [provider]  SMART SENSE THIN LANCETS 26G MISC 1 each by Does not apply route 2 (two) times daily.   Yes [provider]  Tiotropium Bromide-Olodaterol (STIOLTO RESPIMAT) 2.5-2.5 MCG/ACT AERS Inhale 2 puffs into the lungs daily. 10/19/20  Yes Brand Males, MD    Physical Exam: Vitals:   01/16/21 1445 01/16/21 1500 01/16/21 1515 01/16/21 1530  BP: (!) 143/60  (!) 124/102 (!) 158/55  Pulse: 75 74 71 (!) 58  Resp: _0 Temp:      TempSrc:      SpO2: 97% 94% (!) 88% 92%  Weight:      Height:        Constitutional: NAD, calm, comfortable Eyes: PERRL, lids and conjunctivae normal ENMT: Mucous membranes are dry. Posterior pharynx clear of any exudate or lesions.Normal dentition.  Neck: normal, supple, no masses, no thyromegaly Respiratory: Breath sounds slightly decreased bilateral bases with late expiratory wheezing, normal respiratory effort without accessory muscle use, on 2 L nasal cannula with SPO2 97% Cardiovascular: Bradycardic, regular rhythm, no murmurs / rubs / gallops. No extremity edema. 2+ pedal pulses. No carotid bruits.  Abdomen: no tenderness, no masses palpated. No hepatosplenomegaly. Bowel sounds positive.  Musculoskeletal: no clubbing / cyanosis. No joint deformity upper and lower extremities. Good ROM, no contractures. Normal muscle tone.  Skin: no rashes, lesions, ulcers. No induration Neurologic: CN 2-12 grossly intact. Sensation intact, DTR normal. Strength 5/5 in all 4.  Psychiatric: Normal judgment and insight. Alert and oriented x 3. Normal mood.    Labs on Admission: I have personally reviewed following labs and imaging studies  CBC: Recent Labs  Lab 01/16/21 1318  WBC 3.8*  HGB 15.7*  HCT 48.2*  MCV 100.2*  PLT 539*   Basic Metabolic Panel: Recent Labs  Lab 01/16/21 1356  NA 138  K 4.0  CL 101  CO2 27  GLUCOSE 130*  BUN 14  CREATININE 0.67  CALCIUM 8.2*   GFR: Estimated Creatinine Clearance: 62.1 mL/min (by C-G  formula based on SCr of 0.67 mg/dL). Liver Function Tests: Recent Labs  Lab 01/16/21 1356  AST 82*  ALT 37  ALKPHOS 54  BILITOT 0.9  PROT 7.4  ALBUMIN 3.1*   No results for input(s): LIPASE, AMYLASE in the last 168 hours. No results for input(s): AMMONIA in the last 168 hours. Coagulation Profile: No results for input(s): INR, PROTIME in the last 168 hours. Cardiac Enzymes: No results for input(s): CKTOTAL, CKMB, CKMBINDEX, TROPONINI in the last 168 hours. BNP (last 3 results) No results for input(s): PROBNP in the last 8760 hours. HbA1C: No results for input(s): HGBA1C in the last 72 hours. CBG: Recent Labs  Lab 01/16/21 1434  GLUCAP 108*   Lipid Profile: Recent Labs    01/16/21 1356  TRIG 95   Thyroid Function Tests: No results for input(s): TSH, T4TOTAL, FREET4, T3FREE, THYROIDAB in the last 72 hours. Anemia Panel: Recent Labs    01/16/21 1356  FERRITIN 3,688*   Urine analysis: No results found for: COLORURINE, APPEARANCEUR, [place], [place], [place], [place], BILIRUBINUR, KETONESUR,  PROTEINUR, UROBILINOGEN, NITRITE, LEUKOCYTESUR  Radiological Exams on Admission: DG Chest 2 View  Result Date: 01/16/2021 CLINICAL DATA:  Shortness of breath. EXAM: CHEST - 2 VIEW COMPARISON:  September 07, 2020. FINDINGS: Stable cardiomegaly. No pneumothorax or pleural effusion is noted. Stable diffuse interstitial densities are noted which may represent chronic interstitial lung disease, but acute superimposed edema or inflammation cannot be excluded. Bony thorax is unremarkable. IMPRESSION: Stable diffuse interstitial densities are noted which may represent chronic interstitial lung disease, but acute superimposed edema or inflammation cannot be excluded. Aortic Atherosclerosis (ICD10-I70.0). Electronically Signed   By: Marijo Conception M.D.   On: 01/16/2021 13:47    EKG: Independently reviewed.   Assessment/Plan Principal Problem:   Pneumonia due to COVID-19 virus Active  Problems:   Hypothyroidism   Hyperlipidemia   Diabetes mellitus without complication ([place])   Essential (primary) hypertension   ILD (interstitial lung disease) ([place])   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H[facility])   Acute respiratory failure with hypoxia ([facility])    Acute hypoxic respiratory failure secondary to acute Covid-19 viral pneumonia during the ongoing Covid 19 Pandemic - POA Patient presenting to ED with progressive shortness of breath with associated myalgias and decreased appetite over the last week.  Patient initially thought this was related to her home Eliquis, which she stopped taking on 01/13/2021 after discussion with her pulmonologist.  Patient is unvaccinated against Covid-19.  Patient denies any recent sick contacts.  Patient was found to be hypoxic with SPO2 77 on room air on arrival.  Chest x-ray consistent with multifocal pneumonia.  Covid-19/SARS-CoV-2 positive.  Procalcitonin within normal limits. --COVID test: + 01/16/2021 --CRP pending --ddimer 2.76 --Remdesivir, plan 5-day course (Day #1/5) --Continue Solumedrol 48m IV q12h --prone for 2-3hrs every 12hrs if able --Continue supplemental oxygen, titrate to maintain SPO2 greater than 88%, on 2 L nasal cannula with SPO2 97% --Continue supportive care with albuterol MDI prn, vitamin C, zinc, Tylenol, antitussives (benzonatate/ Mucinex/Tussionex) --Follow CBC, CMP, D-dimer, and CRP daily --CTA chest pending given recent stop of her NOAC with history PE September 2021 --Continue airborne/contact isolation precautions  The treatment plan and use of medications and known side effects were discussed with patient/family. Some of the medications used are based on case reports/anecdotal data.  All other medications being used in the management of COVID-19 based on limited study data.  Complete risks and long-term side effects are unknown, however in the best clinical judgment they seem to be of some benefit.  Patient wanted to proceed  with treatment options provided.  Recent saddle pulmonary embolism Patient with recent large volume thrombus including a thin saddle PE with associated right heart strain on CTA 09/05/2020.  Patient was started on IV heparin at that time and PCCM and IR consulted for TPA consideration but patient declined.  Patient was on IV heparin for 48 hours and transition to Eliquis.  Patient recently stopped her Eliquis after contacting her pulmonology office as thought her current shortness of breath related to Eliquis use. --CT angiogram chest: Pending --Will resume patient's home Eliquis  Essential hypertension Currently off antihypertensives.  BP 158/55. --Hydralazine 25 mg p.o. every 6 hours as needed for SBP >180 or DBP >110 --Continue monitor blood pressure closely, if remains chronically elevated may initiate antihypertensive therapy  Hyperlipidemia On lovastatin 40 mg p.o. daily at home, will continue with pravastatin hospital substitution while inpatient  Interstitial lung disease History of pulmonary nodules Patient follows with pulmonology, Dr. RChase Calleroutpatient.  History of ANA positive.  Was previously on nocturnal oxygen, but has been off for some time.  CT high-resolution chest 08/28/2018 with scattered solid pulmonary nodules, largest 7 mm basilar right upper lobe which have been stable. --Continue Combivent inhaler as above --Continue supplemental oxygen, titrate to maintain SPO2 greater than 88% --Outpatient follow-up with pulmonology on discharge   DVT prophylaxis: Eliquis Code Status: Full code Family Communication: Updated patient extensively at bedside, patient updated daughter regarding plan of care Disposition Plan: Anticipate discharge home following treatment Consults called: None Admission status: Inpatient Level of care: Telemetry   Severity of Illness: The appropriate patient status for this patient is INPATIENT. Inpatient status is judged to be reasonable and  necessary in order to provide the required intensity of service to ensure the patient's safety. The patient's presenting symptoms, physical exam findings, and initial radiographic and laboratory data in the context of their chronic comorbidities is felt to place them at high risk for further clinical deterioration. Furthermore, it is not anticipated that the patient will be medically stable for discharge from the hospital within 2 midnights of admission. The following factors support the patient status of inpatient.   " The patient's presenting symptoms include shortness of breath, decreased appetite, myalgias " The worrisome physical exam findings include hypoxia " The initial radiographic and laboratory data are worrisome because of multifocal infiltrates on chest x-ray " The chronic co-morbidities include advanced age, unvaccinated status, hypertension, hyperlipidemia, type 2 diabetes mellitus, history of recent pulmonary embolism.   * I certify that at the point of admission it is my clinical judgment that the patient will require inpatient hospital care spanning beyond 2 midnights from the point of admission due to high intensity of service, high risk for further deterioration and high frequency of surveillance required.*    Julina Altmann J [place] DO [facility] Available via Epic secure chat 7am-7pm After these hours, please refer to coverage provider listed on [email or web address] 01/16/2021, 4:56 PM

## 2021-01-16 NOTE — ED Provider Notes (Signed)
[facility] EMERGENCY DEPARTMENT Provider Note  CSN: [id number] Arrival date & time: 01/16/21 1254    History Chief Complaint  Patient presents with  . Shortness of Breath  . Dizziness    HPI  Elizabeth Melton is a 80 y.o. female with history of interstitial lung disease (per EMR, patient denies having heard this diagnosis before) who was previous on home oxygen at night but she stopped using it over a year ago, also found to have saddle PE in Sept 2021, started on Eliquis then. She is here today for progressive weakness, dizziness, worse with walking although she denies SOB during walking she does get SOB after walking. She states she has had a poor appetite and attributes all these symptoms to Eliquis since they started at the same time she began taking it. She had not considered that her symptoms could be due to effects of the PE itself. She had called her pulmonologist's office earlier this week and advised to hold her eliquis but did not improved and so she came to the ED today. She was noted to be hypoxic and tachypneic in triage, improved with rest and supplemental O2.  Denies history of breathing problems in general but she is a smoker. She denies any history of heart problems. She has not had a Covid vaccine.    Past Medical History:  Diagnosis Date  . Diabetes mellitus without complication ([place])   . Hyperlipidemia   . Hypothyroidism   . Thrombocytopenia ([place])     Past Surgical History:  Procedure Laterality Date  . TUBAL LIGATION      Family History  Problem Relation Age of Onset  . Diabetes Mother   . Kidney disease Mother   . Hypertension Mother   . Other Father        tuberculosis  . Hypertension Sister   . Hyperlipidemia Sister     Social History   Tobacco Use  . Smoking status: Current Every Day Smoker    Packs/day: 0.75    Years: 60.00    Pack years: 45.00    Types: Cigarettes  . Smokeless tobacco: Former Systems developer    Quit date: 09/24/1962  .  Tobacco comment: 1 pack lasts 4 days  Vaping Use  . Vaping Use: Never used  Substance Use Topics  . Alcohol use: Yes  . Drug use: No     Home Medications Prior to Admission medications   Medication Sig Start Date End Date Taking? Authorizing Provider  acetaminophen (TYLENOL) 500 MG tablet Take 500 mg by mouth daily as needed for headache (pain).    Yes [provider]  apixaban (ELIQUIS) 5 MG TABS tablet Take 1 tablet (5 mg total) by mouth 2 (two) times daily. 10/11/20  Yes Mercy Riding, MD  b complex vitamins tablet Take 1 tablet by mouth daily.   Yes [provider]  Blood Glucose Monitoring Suppl (TRUE METRIX METER) w/Device KIT 1 each by Other route in the morning and at bedtime. 12/25/20  Yes [provider]  Calcium Carb-Cholecalciferol (CALCIUM + D3) 600-200 MG-UNIT TABS Take 2 tablets by mouth daily.    Yes [provider]  cholecalciferol (VITAMIN D3) 25 MCG (1000 UT) tablet Take 1,000 Units by mouth daily.   Yes [provider]  glucose blood test strip 1 each by Other route as needed for other (blood sugar).   Yes [provider]  levothyroxine (SYNTHROID, LEVOTHROID) 75 MCG tablet Take 75 mcg by mouth daily before breakfast.  Yes [provider]  lovastatin (MEVACOR) 40 MG tablet Take 40 mg by mouth at bedtime.   Yes [provider]  Omega-3 Fatty Acids (FISH OIL PO) Take 1 capsule by mouth daily.   Yes [provider]  SMART SENSE THIN LANCETS 26G MISC 1 each by Does not apply route 2 (two) times daily.   Yes [provider]  Tiotropium Bromide-Olodaterol (STIOLTO RESPIMAT) 2.5-2.5 MCG/ACT AERS Inhale 2 puffs into the lungs daily. 10/19/20  Yes Brand Males, MD     Allergies    Patient has no known allergies.   Review of Systems   Review of Systems A comprehensive review of systems was completed and negative except as noted in HPI.    Physical Exam BP (!) 158/55   Pulse  (!) 58   Temp 99.2 F (37.3 C) (Oral)   Resp 17   Ht _0  (1.676 m)   Wt 80.7 kg   SpO2 92%   BMI 28.73 kg/m   Physical Exam Vitals and nursing note reviewed.  Constitutional:      Appearance: Normal appearance.  HENT:     Head: Normocephalic and atraumatic.     Nose: Nose normal.     Mouth/Throat:     Mouth: Mucous membranes are moist.  Eyes:     Extraocular Movements: Extraocular movements intact.     Conjunctiva/sclera: Conjunctivae normal.  Cardiovascular:     Rate and Rhythm: Normal rate.  Pulmonary:     Effort: Pulmonary effort is normal. Tachypnea present. No accessory muscle usage or respiratory distress.     Breath sounds: Rales present.  Abdominal:     General: Abdomen is flat.     Palpations: Abdomen is soft.     Tenderness: There is no abdominal tenderness.  Musculoskeletal:        General: No swelling. Normal range of motion.     Cervical back: Neck supple.  Skin:    General: Skin is warm and dry.  Neurological:     General: No focal deficit present.     Mental Status: She is alert.  Psychiatric:        Mood and Affect: Mood normal.      ED Results / Procedures / Treatments   Labs (all labs ordered are listed, but only abnormal results are displayed) Labs Reviewed  SARS CORONAVIRUS 2 BY RT PCR ([facility]) - Abnormal; Notable for the following components:      Result Value   SARS Coronavirus 2 POSITIVE (*)    All other components within normal limits  CBC - Abnormal; Notable for the following components:   WBC 3.8 (*)    Hemoglobin 15.7 (*)    HCT 48.2 (*)    MCV 100.2 (*)    Platelets 107 (*)    nRBC 1.9 (*)    All other components within normal limits  COMPREHENSIVE METABOLIC PANEL - Abnormal; Notable for the following components:   Glucose, Bld 130 (*)    Calcium 8.2 (*)    Albumin 3.1 (*)    AST 82 (*)    All other components within normal limits  D-DIMER, QUANTITATIVE (NOT AT [facility]) -  Abnormal; Notable for the following components:   D-Dimer, Quant 2.76 (*)    All other components within normal limits  LACTATE DEHYDROGENASE - Abnormal; Notable for the following components:   LDH 495 (*)    All other components within normal limits  FERRITIN -  Abnormal; Notable for the following components:   Ferritin 3,688 (*)    All other components within normal limits  C-REACTIVE PROTEIN - Abnormal; Notable for the following components:   CRP 3.5 (*)    All other components within normal limits  BRAIN NATRIURETIC PEPTIDE - Abnormal; Notable for the following components:   B Natriuretic Peptide 115.0 (*)    All other components within normal limits  CBG MONITORING, ED - Abnormal; Notable for the following components:   Glucose-Capillary 108 (*)    All other components within normal limits  CULTURE, BLOOD (ROUTINE X 2)  CULTURE, BLOOD (ROUTINE X 2)  LACTIC ACID, PLASMA  PROCALCITONIN  TRIGLYCERIDES  FIBRINOGEN  URINALYSIS, ROUTINE W REFLEX MICROSCOPIC  LACTIC ACID, PLASMA  DIFFERENTIAL  TROPONIN I (HIGH SENSITIVITY)  TROPONIN I (HIGH SENSITIVITY)    EKG EKG Interpretation  Date/Time:  Sunday January 16 2021 13:10:54 EST Ventricular Rate:  78 PR Interval:    QRS Duration: 94 QT Interval:  381 QTC Calculation: 434 R Axis:   35 Text Interpretation: Sinus or ectopic atrial rhythm Borderline short PR interval Low voltage, extremity leads Repol abnrm, severe global ischemia (LM/MVD) 12 Lead; [place] not significantly different than prior Confirmed by Malvin Johns [phone]) on 01/16/2021 1:23:00 PM   Radiology DG Chest 2 View  Result Date: 01/16/2021 CLINICAL DATA:  Shortness of breath. EXAM: CHEST - 2 VIEW COMPARISON:  September 07, 2020. FINDINGS: Stable cardiomegaly. No pneumothorax or pleural effusion is noted. Stable diffuse interstitial densities are noted which may represent chronic interstitial lung disease, but acute superimposed edema or inflammation cannot be  excluded. Bony thorax is unremarkable. IMPRESSION: Stable diffuse interstitial densities are noted which may represent chronic interstitial lung disease, but acute superimposed edema or inflammation cannot be excluded. Aortic Atherosclerosis (ICD10-I70.0). Electronically Signed   By: Marijo Conception M.D.   On: 01/16/2021 13:47    Procedures .Critical Care Performed by: Truddie Hidden, MD Authorized by: Truddie Hidden, MD   Critical care provider statement:    Critical care time (minutes):  40   Critical care time was exclusive of:  Separately billable procedures and treating other patients   Critical care was necessary to treat or prevent imminent or life-threatening deterioration of the following conditions:  Respiratory failure   Critical care was time spent personally by me on the following activities:  Discussions with consultants, evaluation of patient's response to treatment, examination of patient, ordering and performing treatments and interventions, ordering and review of laboratory studies, ordering and review of radiographic studies, pulse oximetry, re-evaluation of patient's condition, obtaining history from patient or surrogate and review of old charts    Medications Ordered in the ED Medications  methylPREDNISolone sodium succinate (SOLU-MEDROL) 125 mg/2 mL injection 80.625 mg (80.625 mg Intravenous Given 01/16/21 1616)  iohexol (OMNIPAQUE) 350 MG/ML injection 100 mL (has no administration in time range)  remdesivir 200 mg in sodium chloride 0.9% 250 mL IVPB (has no administration in time range)    Followed by  remdesivir 100 mg in sodium chloride 0.9 % 100 mL IVPB (has no administration in time range)     MDM Rules/Calculators/A&P MDM CXR with similar appearing ILD from September when she was also found to have PE. Also concern for Covid given hypoxia, low grade fever and unvaccinated status. Will begin Covid pre-admission orders, add Trop and BNP as well. O2 titrated  down during my evaluation now on 2L with SpO2 mid 90s.   ED Course  I have  reviewed the triage vital signs and the nursing notes.  Pertinent labs & imaging results that were available during my care of the patient were reviewed by me and considered in my medical decision making (see chart for details).  Clinical Course as of 01/16/21 1619  Sun Jan 16, 2021  1522 CMP is unremarkable, LDH and dimer both elevated, Lactic acid is neg.  [CS]  1523 CBC with leukopenia. Diff pending.  [CS]  8208 Trop is neg. Covid is confirmed positive. Will check CTA to eval recurrence of PE given hypoxia, elevated dimer and not taking Eliquis. Begin solumedrol, remdesivir.  [CS]  1615 Spoke with Dr. [place] Hospitalist who will evaluate for admission.  [CS]    Clinical Course User Index [CS] Truddie Hidden, MD    Final Clinical Impression(s) / ED Diagnoses Final diagnoses:  COVID-19  Acute respiratory failure with hypoxia [facility])    Rx / DC Orders ED Discharge Orders    None       Truddie Hidden, MD 01/16/21 [phone]

## 2021-01-17 DIAGNOSIS — J1282 Pneumonia due to coronavirus disease 2019: Secondary | ICD-10-CM | POA: Diagnosis not present

## 2021-01-17 DIAGNOSIS — U071 COVID-19: Secondary | ICD-10-CM | POA: Diagnosis not present

## 2021-01-17 LAB — CBC WITH DIFFERENTIAL/PLATELET
Abs Immature Granulocytes: 0 10*3/uL (ref 0.00–0.07)
Basophils Absolute: 0 10*3/uL (ref 0.0–0.1)
Basophils Relative: 0 %
Blasts: 4 %
Eosinophils Absolute: 0 10*3/uL (ref 0.0–0.5)
Eosinophils Relative: 1 %
HCT: 46.2 % — ABNORMAL HIGH (ref 36.0–46.0)
Hemoglobin: 15 g/dL (ref 12.0–15.0)
Lymphocytes Relative: 18 %
Lymphs Abs: 0.5 10*3/uL — ABNORMAL LOW (ref 0.7–4.0)
MCH: 32.5 pg (ref 26.0–34.0)
MCHC: 32.5 g/dL (ref 30.0–36.0)
MCV: 100.2 fL — ABNORMAL HIGH (ref 80.0–100.0)
Monocytes Absolute: 0.2 10*3/uL (ref 0.1–1.0)
Monocytes Relative: 6 %
Neutro Abs: 1.8 10*3/uL (ref 1.7–7.7)
Neutrophils Relative %: 71 %
Platelets: 108 10*3/uL — ABNORMAL LOW (ref 150–400)
RBC: 4.61 MIL/uL (ref 3.87–5.11)
RDW: 14.4 % (ref 11.5–15.5)
WBC: 2.6 10*3/uL — ABNORMAL LOW (ref 4.0–10.5)
nRBC: 3.1 % — ABNORMAL HIGH (ref 0.0–0.2)
nRBC: 6 /100 WBC — ABNORMAL HIGH

## 2021-01-17 LAB — COMPREHENSIVE METABOLIC PANEL
ALT: 34 U/L (ref 0–44)
AST: 74 U/L — ABNORMAL HIGH (ref 15–41)
Albumin: 2.7 g/dL — ABNORMAL LOW (ref 3.5–5.0)
Alkaline Phosphatase: 49 U/L (ref 38–126)
Anion gap: 10 (ref 5–15)
BUN: 16 mg/dL (ref 8–23)
CO2: 25 mmol/L (ref 22–32)
Calcium: 7.9 mg/dL — ABNORMAL LOW (ref 8.9–10.3)
Chloride: 102 mmol/L (ref 98–111)
Creatinine, Ser: 0.74 mg/dL (ref 0.44–1.00)
GFR, Estimated: >60 mL/min (ref >60)
Glucose, Bld: 230 mg/dL — ABNORMAL HIGH (ref 70–99)
Potassium: 4 mmol/L (ref 3.5–5.1)
Sodium: 137 mmol/L (ref 135–145)
Total Bilirubin: 0.7 mg/dL (ref 0.3–1.2)
Total Protein: 6.5 g/dL (ref 6.5–8.1)

## 2021-01-17 LAB — D-DIMER, QUANTITATIVE: D-Dimer, Quant: 3.39 ug/mL-FEU — ABNORMAL HIGH (ref 0.00–0.50)

## 2021-01-17 LAB — GLUCOSE, CAPILLARY
Glucose-Capillary: 173 mg/dL — ABNORMAL HIGH (ref 70–99)
Glucose-Capillary: 256 mg/dL — ABNORMAL HIGH (ref 70–99)
Glucose-Capillary: 237 mg/dL — ABNORMAL HIGH (ref 70–99)
Glucose-Capillary: 255 mg/dL — ABNORMAL HIGH (ref 70–99)

## 2021-01-17 LAB — HEMOGLOBIN A1C
Hgb A1c MFr Bld: 7.6 % — ABNORMAL HIGH (ref 4.8–5.6)
Mean Plasma Glucose: 171.42 mg/dL

## 2021-01-17 LAB — C-REACTIVE PROTEIN: CRP: 3.4 mg/dL — ABNORMAL HIGH

## 2021-01-17 LAB — MAGNESIUM: Magnesium: 2.2 mg/dL (ref 1.7–2.4)

## 2021-01-17 NOTE — Evaluation (Signed)
Physical Therapy Evaluation Patient Details Name: Elizabeth Melton MRN: [id number] DOB: [birth year] Today's Date: 01/17/2021   History of Present Illness  80 yo female admitted with COVID. Hx of PE, DM, ILD, R LE DVT  Clinical Impression  On eval, pt required Min assist for mobility. She walked ~20 feet x 2 around the room with use of IV pole for support. Pt is unsteady. O2 dropped to 81% on RA while getting to EOB. Walked x 1 on 2L and O2 dropped to 84%. Walked x 1 on 3L and O2 86%. Assisted pt into recliner to sit up for a bit. Will plan to follow and progress activity as tolerated.     Follow Up Recommendations Home health PT;Supervision - Intermittent    Equipment Recommendations  None recommended by PT    Recommendations for Other Services       Precautions / Restrictions Precautions Precautions: Fall Restrictions Weight Bearing Restrictions: No      Mobility  Bed Mobility Overal bed mobility: Modified Independent                  Transfers Overall transfer level: Needs assistance Equipment used: Rolling walker (2 wheeled) Transfers: Sit to/from Stand Sit to Stand: Min guard         General transfer comment: Increased time. Min guard for safety. Cues for hand placement.  Ambulation/Gait Ambulation/Gait assistance: Min assist Gait Distance (Feet): 20 Feet (x2) Assistive device: IV Pole Gait Pattern/deviations: Step-through pattern;Decreased stride length     General Gait Details: Pt used 2 hands on IV pole for support. Unsteady. Slow gait speed. O2 84% on 2L [place], dyspnea 2/4. Seated rest break between walks.  Stairs            Wheelchair Mobility    Modified Rankin (Stroke Patients Only)       Balance Overall balance assessment: Needs assistance           Standing balance-Leahy Scale: Fair                               Pertinent Vitals/Pain Pain Assessment: No/denies pain    Home Living Family/patient expects to be  discharged to:: Private residence Living Arrangements: Children Available Help at Discharge: Family;Available PRN/intermittently Type of Home: House Home Access: Stairs to enter   Entrance Stairs-Number of Steps: 3 Home Layout: One level Home Equipment: Cane - single point;Walker - 2 wheels      Prior Function Level of Independence: Independent with assistive device(s)         Comments: uses cane     Hand Dominance        Extremity/Trunk Assessment   Upper Extremity Assessment Upper Extremity Assessment: Overall WFL for tasks assessed    Lower Extremity Assessment Lower Extremity Assessment: Generalized weakness    Cervical / Trunk Assessment Cervical / Trunk Assessment: Normal  Communication   Communication: No difficulties  Cognition Arousal/Alertness: Awake/alert Behavior During Therapy: WFL for tasks assessed/performed Overall Cognitive Status: Within Functional Limits for tasks assessed                                        General Comments      Exercises     Assessment/Plan    PT Assessment Patient needs continued PT services  PT Problem List Decreased mobility;Decreased activity tolerance;Decreased balance  PT Treatment Interventions DME instruction;Gait training;Therapeutic activities;Therapeutic exercise;Patient/family education;Balance training;Functional mobility training    PT Goals (Current goals can be found in the Care Plan section)  Acute Rehab PT Goals Patient Stated Goal: to get better and get home PT Goal Formulation: With patient Time For Goal Achievement: 01/31/21 Potential to Achieve Goals: Good    Frequency Min 3X/week   Barriers to discharge        Co-evaluation               AM-PAC PT "6 Clicks" Mobility  Outcome Measure Help needed turning from your back to your side while in a flat bed without using bedrails?: None Help needed moving from lying on your back to sitting on the side of a  flat bed without using bedrails?: None Help needed moving to and from a bed to a chair (including a wheelchair)?: A Little Help needed standing up from a chair using your arms (e.g., wheelchair or bedside chair)?: A Little Help needed to walk in hospital room?: A Little Help needed climbing 3-5 steps with a railing? : A Little 6 Click Score: 20    End of Session Equipment Utilized During Treatment: Oxygen Activity Tolerance: Patient tolerated treatment well Patient left: in chair;with call bell/phone within reach;with chair alarm set   PT Visit Diagnosis: Unsteadiness on feet (R26.81);Difficulty in walking, not elsewhere classified (R26.2)    Time: 2426-8341 PT Time Calculation (min) (ACUTE ONLY): 32 min   Charges:   PT Evaluation $PT Eval Moderate Complexity: 1 Mod PT Treatments $Gait Training: 8-22 mins           Doreatha Massed, PT Acute Rehabilitation  Office: [phone] Pager: [phone]

## 2021-01-17 NOTE — Plan of Care (Signed)
  Problem: Education: Goal: Knowledge of General Education information will improve Description: Including pain rating scale, medication(s)/side effects and non-pharmacologic comfort measures Outcome: Progressing   Problem: Clinical Measurements: Goal: Respiratory complications will improve Outcome: Progressing   Problem: Coping: Goal: Level of anxiety will decrease Outcome: Progressing

## 2021-01-17 NOTE — Plan of Care (Addendum)
1207- Per cardiac monitoring patient had 12 beats V. Tach on 2/6 _0 . Previous RN reported she was asymptomatic. Patient also reported her urine had blood  overnight x2. She was concerned about taking eliquis. MD was notified and said okay to give eliquis, to continue tele and monitor Hgb. RN explained this to patient.   1640- MD was notified of Patient 4 sec run of SVT. Patient asymptomatic. Per MD will continue to monitor.  Problem: Clinical Measurements: Goal: Cardiovascular complication will be avoided Outcome: Progressing Note: Pt was educated on eliquis and RN notified MD of her concerns. Will remain on tele and monitor her Hgb

## 2021-01-17 NOTE — Progress Notes (Signed)
PROGRESS NOTE    Elizabeth Melton  UMP:[id number] DOB: [birth year] DOA: 01/16/2021 PCP: Kathyrn Lass, MD    Brief Narrative:  Elizabeth Melton is a 80 y.o. female with medical history significant of ILD (ANA positive) who was previously on nocturnal oxygen but has been off for some time, history of saddle PE recently stopped Eliquis, essential hypertension, hypothyroidism, hyperlipidemia, type 2 diabetes mellitus who presented to the ED with progressive shortness of breath and intermittent dizziness over the past week.  Patient initially thought it was related to her Eliquis, she contacted her pulmonologist who elected to have patient stop her anticoagulant.  Patient symptoms continue to progress now associated with decreased appetite, myalgias.  Patient denies any sick contacts, no recent travel and no other changes in medication regimen.  Patient specifically denies visual changes, no chest pain, no palpitations, no nausea/vomiting/diarrhea, no abdominal pain, no chills/night sweats, no paresthesias.  In the ED, Temperature 99.2, HR 58, RR 17, BP 158/55, SPO2 78% on room air.  WBC 3.8, hemoglobin 15.7, platelet 107.  Sodium 138, potassium 4.0, chloride 101, CO2 27, BUN 14, creatinine 0.67.  LDH 495, troponin XIII, ferritin 3688, CRP 3.5, lactic acid 1.8, procalcitonin 0.10.  D-dimer 2.76.  Covid-19/SARS-CoV-2 positive.  Blood cultures x2 ordered.  Chest x-ray with stable diffuse interstitial densities bilaterally.  CT angiogram chest pending.  Hospitalist service consulted for further evaluation and management of acute hypoxic respite failure secondary to Covid-19 viral pneumonia.   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  Patient initially thought this was related to her home Eliquis, which she stopped taking on 01/13/2021 after discussion with her pulmonologist.  Patient is unvaccinated against Covid-19.  Patient denies any recent sick contacts.  Patient was found to be hypoxic with SPO2 77 on room air on arrival.  Chest x-ray consistent with multifocal pneumonia.  Covid-19/SARS-CoV-2 positive.  Procalcitonin within normal limits.  CT angiogram chest with no evidence of acute PE with increased superimposed ill-defined groundglass opacities and interlobar septal thickening consistent with multifocal pneumonia. --COVID test: + 01/16/2021 --CRP 3.5>3.4 --ddimer 2.76>3.39 --Remdesivir, plan 5-day course (Day #2/5) --Continue Solumedrol 79m IV q12h --prone for 2-3hrs every 12hrs if able --Continue supplemental oxygen, titrate to maintain SPO2 greater than 88%, on 2 L nasal cannula with SPO2 97% --Continue supportive care with albuterol MDI prn, vitamin C, zinc, Tylenol, antitussives (benzonatate/ Mucinex/Tussionex) --Follow CBC, CMP, D-dimer, and CRP daily --CTA chest pending given recent stop of her NOAC with history PE September 2021 --Continue airborne/contact isolation precautions  The treatment plan and use of medications and known side effects were discussed with patient/family. Some of the medications used are based on case reports/anecdotal data.  All other medications being used in the management of COVID-19 based on limited study data. Complete risks and long-term side effects are unknown, however in the best clinical judgment they seem to be of some benefit.  Patient wanted to proceed with treatment options provided.  Recent saddle  pulmonary  embolism Patient with recent large volume thrombus including a thin saddle PE with associated right heart strain on CTA 09/05/2020.  Patient was started on IV heparin at that time and PCCM and IR consulted for TPA consideration but patient declined.  Patient was on IV heparin for 48 hours and transition to Eliquis.  Patient recently stopped her Eliquis after contacting her pulmonology office as thought her current shortness of breath related to Eliquis use. CT angiogram chest negative for PE. --Continue home Eliquis  Essential hypertension Currently off antihypertensives.  BP 158/55. --Hydralazine 25 mg p.o. every 6 hours as needed for SBP >180 or DBP >110 --Continue monitor blood pressure closely, if remains chronically elevated may initiate antihypertensive therapy  Hyperlipidemia On lovastatin 40 mg p.o. daily at home, will continue with pravastatin hospital substitution while inpatient  Interstitial lung disease History of pulmonary nodules Patient follows with pulmonology, Dr. [name] outpatient.  History of ANA positive.  Was previously on nocturnal oxygen, but has been off for some time.  CT high-resolution chest 08/28/2018 with scattered solid pulmonary nodules, largest 7 mm basilar right upper lobe which have been stable. --Continue Combivent inhaler as above --Continue supplemental oxygen, titrate to maintain SPO2 greater than 88% --Outpatient follow-up with pulmonology on discharge   DVT prophylaxis: Eliquis   Code Status: Full Code Family Communication: Updated patient extensively at bedside  Disposition Plan:  Level of care: Telemetry Status is: Inpatient  Remains inpatient appropriate because:Ongoing diagnostic testing needed not appropriate for outpatient work up, Unsafe d/c plan, IV treatments appropriate due to intensity of illness or inability to take PO and Inpatient level of care appropriate due to severity of illness   Dispo: The patient is from:  Home              Anticipated d/c is to: Home              Anticipated d/c date is: 3 days              Patient currently is not medically stable to d/c.   Difficult to place patient No   Consultants:   none  Procedures:   none  Antimicrobials:   none   Subjective: Patient seen and examined bedside, resting comfortably.  Continues on submental oxygen.  Reports some blood in her urine overnight.  Concerned that she is on Eliquis.  Hemoglobin stable 15.0 today.  No other questions or concerns at this time.  Denies headache, no visual changes, no chest pain, palpitations, no shortness of breath, no abdominal pain, no fever/chills/night sweats, no nausea/vomiting/diarrhea.  No other acute concerns overnight per nursing staff.  Objective: Vitals:   01/16/21 1818 01/16/21 2147 01/17/21 0148 01/17/21 0554  BP: (!) 172/58 (!) 140/49 133/80 (!) 150/60  Pulse: 77 72 64 70  Resp: _0 (!) 22  Temp: 100.2 F (37.9 C) 97.8 F (36.6 C) 98.7 F (37.1 C) 97.7 F (36.5 C)  TempSrc: Oral Oral  Oral  SpO2: 99% 91% 96% 90%  Weight:      Height:        Intake/Output Summary (Last 24 hours) at 01/17/2021 1157 Last data filed at 01/16/2021 1750 Gross per 24 hour  Intake 290.5 ml  Output --  Net 290.5 ml   Filed Weights   01/16/21 1312  Weight: 80.7 kg    Examination:  General exam: Appears calm and comfortable  Respiratory system: Clear to auscultation. Respiratory effort normal.  On 2 L nasal cannula with SPO2 90%  Cardiovascular system: S1 & S2 heard, RRR. No JVD, murmurs, rubs, gallops or clicks. No pedal edema. Gastrointestinal system: Abdomen is nondistended, soft and nontender. No organomegaly or masses felt. Normal bowel sounds heard. Central nervous system: Alert and oriented. No focal neurological deficits. Extremities: Symmetric 5 x 5 power. Skin: No rashes, lesions or ulcers Psychiatry: Judgement and insight appear normal. Mood & affect appropriate.     Data  Reviewed: I have personally reviewed following labs and imaging studies  CBC: Recent Labs  Lab 01/16/21 1318 01/16/21 1622 01/17/21 0319  WBC 3.8* 2.9* 2.6*  NEUTROABS  --  1.4* 1.8  HGB 15.7* 14.4 15.0  HCT 48.2* 42.9 46.2*  MCV 100.2* 98.2 100.2*  PLT 107* 96* 518*   Basic Metabolic Panel: Recent Labs  Lab 01/16/21 1356 01/17/21 0319  NA 138 137  K 4.0 4.0  CL 101 102  CO2 27 25  GLUCOSE 130* 230*  BUN 14 16  CREATININE 0.67 0.74  CALCIUM 8.2* 7.9*  MG  --  2.2   GFR: Estimated Creatinine Clearance: 62.1 mL/min (by C-G formula based on SCr of 0.74 mg/dL). Liver Function Tests: Recent Labs  Lab 01/16/21 1356 01/17/21 0319  AST 82* 74*  ALT 37 34  ALKPHOS 54 49  BILITOT 0.9 0.7  PROT 7.4 6.5  ALBUMIN 3.1* 2.7*   No results for input(s): LIPASE, AMYLASE in the last 168 hours. No results for input(s): AMMONIA in the last 168 hours. Coagulation Profile: No results for input(s): INR, PROTIME in the last 168 hours. Cardiac Enzymes: No results for input(s): CKTOTAL, CKMB, CKMBINDEX, TROPONINI in the last 168 hours. BNP (last 3 results) No results for input(s): PROBNP in the last 8760 hours. HbA1C: Recent Labs    01/16/21 1655  HGBA1C 7.6*   CBG: Recent Labs  Lab 01/16/21 1434 01/16/21 1829 01/16/21 2149 01/17/21 0759 01/17/21 1141  GLUCAP 108* 117* 318* 173* 256*   Lipid Profile: Recent Labs    01/16/21 1356  TRIG 95   Thyroid Function Tests: Recent Labs    01/16/21 1655  TSH 2.081   Anemia Panel: Recent Labs    01/16/21 1356  FERRITIN 3,688*   Sepsis Labs: Recent Labs  Lab 01/16/21 1356 01/16/21 1556  PROCALCITON 0.10  --   LATICACIDVEN 1.8 1.4    Recent Results (from the past 240 hour(s))  SARS Coronavirus 2 by RT PCR (hospital order, performed in [place] hospital lab) Nasopharyngeal Nasopharyngeal Swab     Status: Abnormal   Collection Time: 01/16/21  1:56 PM   Specimen: Nasopharyngeal Swab  Result Value Ref Range  Status   SARS Coronavirus 2 POSITIVE (A) NEGATIVE Final    Comment: RESULT CALLED TO, READ BACK BY AND VERIFIED WITH: RIVERS,TJ AT 1552 ON 01/16/2021 BY JPM (NOTE) SARS-CoV-2 target nucleic acids are DETECTED  SARS-CoV-2 RNA is generally detectable in upper respiratory specimens  during the acute phase of infection.  Positive results are indicative  of the presence of the identified virus, but do not rule out bacterial infection or co-infection with other pathogens not detected by the test.  Clinical correlation with patient history and  other diagnostic information is necessary to determine patient infection status.  The expected result is negative.  Fact Sheet for Patients:   StrictlyIdeas.no   Fact Sheet for Healthcare Providers:   BankingDealers.co.za    This test is not yet approved or cleared by the [place] FDA and  has been authorized for detection and/or diagnosis of SARS-CoV-2  by FDA under an Emergency Use Authorization (EUA).  This EUA will remain in effect (meaning this  test can be used) for the duration of  the COVID-19 declaration under Section 564(b)(1) of the Act, 21 U.S.C. section 360-bbb-3(b)(1), unless the authorization is terminated or revoked sooner.  Performed at [facility], [place] [address] [place], [place] [address]   Blood Culture (routine x 2)     Status: None (Preliminary result)   Collection Time: 01/16/21  2:01 PM   Specimen: BLOOD  Result Value Ref Range Status   Specimen Description   Final    BLOOD RIGHT ANTECUBITAL Performed at [facility] [address], [place], [place] [address]    Special Requests   Final    BOTTLES DRAWN AEROBIC AND ANAEROBIC Blood Culture adequate volume Performed at [facility] [address] [place], [place] [address]    Culture   Final    NO GROWTH < 24 HOURS Performed at [facility] [address], [place], [place] [address]    Report Status PENDING  Incomplete  Blood Culture (routine x 2)     Status: None (Preliminary result)   Collection Time: 01/16/21  2:30 PM   Specimen: BLOOD  Result Value Ref Range Status   Specimen Description   Final    BLOOD LEFT ANTECUBITAL BOTTLES DRAWN AEROBIC AND ANAEROBIC Blood Culture adequate volume Performed at [facility] [address], [place], [place] [address]    Special Requests   Final    BOTTLES DRAWN AEROBIC AND ANAEROBIC Blood Culture adequate volume   Culture   Final    NO GROWTH < 24 HOURS Performed at [facility], [place] [address], [place], [place] [address]    Report Status PENDING  Incomplete         Radiology Studies: DG Chest 2 View  Result Date: 01/16/2021 CLINICAL DATA:  Shortness of breath. EXAM: CHEST - 2 VIEW COMPARISON:  September 07, 2020. FINDINGS: Stable cardiomegaly. No pneumothorax or pleural effusion is noted. Stable diffuse interstitial densities are noted which may represent chronic interstitial lung disease, but acute superimposed edema or inflammation cannot be excluded. Bony thorax is unremarkable. IMPRESSION: Stable diffuse interstitial densities are noted which may represent chronic interstitial lung disease, but acute superimposed edema or inflammation cannot be excluded. Aortic Atherosclerosis (ICD10-I70.0). Electronically Signed   By: Marijo Conception M.D.   On: 01/16/2021 13:47   CT Angio Chest PE W and/or Wo Contrast  Result Date: 01/16/2021 CLINICAL DATA:  COVID.  History of ILD EXAM: CT ANGIOGRAPHY CHEST WITH CONTRAST TECHNIQUE: Multidetector CT imaging of the chest was performed using the standard protocol during bolus administration of intravenous contrast. Multiplanar CT image reconstructions and MIPs were obtained to evaluate the vascular anatomy. CONTRAST:  143m OMNIPAQUE IOHEXOL 350 MG/ML SOLN COMPARISON:  September 05, 2020, October 20, 2019 FINDINGS: Cardiovascular:  Limited evaluation of the bases secondary to respiratory motion. Satisfactory opacification of the pulmonary arteries to the segmental level. No evidence of pulmonary embolism. Heart is enlarged. No pericardial effusion. Calcifications of the aortic valve. Atherosclerotic calcifications of the aorta. Predominately LEFT-sided coronary artery atherosclerotic calcifications. Mediastinum/Nodes: Thyroid is unremarkable. Calcified subcarinal lymph node consistent with sequela of prior granulomatous infection. Revisualization of mediastinal and hilar adenopathy. Lymph node borders are difficult to ascertain. Pretracheal lymph node measures 15 mm in the short axis, previously 9 mm in 2020 (series 5, image 57). RIGHT infrahilar lymph node  measures 12 mm in the short axis (series 5, image 116). Preaortic lymph node measures 8 mm in the short axis, previously 5 mm in 2020 (series 4, image 41). Lungs/Pleura: No pneumothorax. Subpleural reticulation, traction bronchiectasis and honeycombing are similar in comparison to prior study from 2020. However there is increased superimposed ill-defined ground-glass opacities and interlobular septal thickening in comparison to prior. Ground-glass opacities are in a predominantly peripheral distribution. Increased bronchial wall thickening in comparison to prior. Debris within the trachea. More focal nodular opacity of the RIGHT upper lobe along the fissure measures 7 by 7 mm, unchanged in comparison to prior (series 6, image 65). Upper Abdomen: Small hiatal hernia.  No acute findings. Musculoskeletal: Degenerative changes of the thoracic spine. Review of the MIP images confirms the above findings. IMPRESSION: 1. No evidence of acute pulmonary embolism. 2. Increased superimposed ill-defined ground-glass opacities and interlobular septal thickening in comparison to prior study from 2020. Findings are consistent with superimposed COVID 19 infection on a background of interstitial lung  disease. 3. Mediastinal and hilar adenopathy, likely reactive. Aortic Atherosclerosis (ICD10-I70.0). Electronically Signed   By: Valentino Saxon MD   On: 01/16/2021 16:50        Scheduled Meds: . apixaban  5 mg Oral BID  . vitamin C  500 mg Oral Daily  . insulin aspart  0-5 Units Subcutaneous QHS  . insulin aspart  0-6 Units Subcutaneous TID WC  . Ipratropium-Albuterol  1 puff Inhalation Q6H  . levothyroxine  75 mcg Oral QAC breakfast  . methylPREDNISolone (SOLU-MEDROL) injection  1 mg/kg Intravenous Q12H  . pravastatin  40 mg Oral q1800  . umeclidinium bromide  1 puff Inhalation Daily  . zinc sulfate  220 mg Oral Daily   Continuous Infusions: . remdesivir 100 mg in NS 100 mL 100 mg (01/17/21 1059)     LOS: 1 day    Time spent: 36 minutes spent on chart review, discussion with nursing staff, consultants, updating family and interview/physical exam; more than 50% of that time was spent in counseling and/or coordination of care.    Mekaela Azizi J [place] DO [facility] Available via Epic secure chat 7am-7pm After these hours, please refer to coverage provider listed on [email or web address] 01/17/2021, 11:57 AM

## 2021-01-18 DIAGNOSIS — J1282 Pneumonia due to coronavirus disease 2019: Secondary | ICD-10-CM | POA: Diagnosis not present

## 2021-01-18 DIAGNOSIS — U071 COVID-19: Secondary | ICD-10-CM | POA: Diagnosis not present

## 2021-01-18 LAB — GLUCOSE, CAPILLARY
Glucose-Capillary: 249 mg/dL — ABNORMAL HIGH (ref 70–99)
Glucose-Capillary: 328 mg/dL — ABNORMAL HIGH (ref 70–99)
Glucose-Capillary: 253 mg/dL — ABNORMAL HIGH (ref 70–99)
Glucose-Capillary: 254 mg/dL — ABNORMAL HIGH (ref 70–99)

## 2021-01-18 LAB — CBC WITH DIFFERENTIAL/PLATELET
Abs Immature Granulocytes: 0.04 10*3/uL (ref 0.00–0.07)
Basophils Absolute: 0 10*3/uL (ref 0.0–0.1)
Basophils Relative: 0 %
Eosinophils Absolute: 0 10*3/uL (ref 0.0–0.5)
Eosinophils Relative: 0 %
HCT: 43.6 % (ref 36.0–46.0)
Hemoglobin: 14.2 g/dL (ref 12.0–15.0)
Immature Granulocytes: 1 %
Lymphocytes Relative: 13 %
Lymphs Abs: 1 10*3/uL (ref 0.7–4.0)
MCH: 32.6 pg (ref 26.0–34.0)
MCHC: 32.6 g/dL (ref 30.0–36.0)
MCV: 100.2 fL — ABNORMAL HIGH (ref 80.0–100.0)
Monocytes Absolute: 0.5 10*3/uL (ref 0.1–1.0)
Monocytes Relative: 7 %
Neutro Abs: 6 10*3/uL (ref 1.7–7.7)
Neutrophils Relative %: 79 %
Platelets: 138 10*3/uL — ABNORMAL LOW (ref 150–400)
RBC: 4.35 MIL/uL (ref 3.87–5.11)
RDW: 14.1 % (ref 11.5–15.5)
WBC: 7.5 10*3/uL (ref 4.0–10.5)
nRBC: 1.7 % — ABNORMAL HIGH (ref 0.0–0.2)

## 2021-01-18 LAB — COMPREHENSIVE METABOLIC PANEL
ALT: 31 U/L (ref 0–44)
AST: 57 U/L — ABNORMAL HIGH (ref 15–41)
Albumin: 2.6 g/dL — ABNORMAL LOW (ref 3.5–5.0)
Alkaline Phosphatase: 50 U/L (ref 38–126)
Anion gap: 9 (ref 5–15)
BUN: 24 mg/dL — ABNORMAL HIGH (ref 8–23)
CO2: 24 mmol/L (ref 22–32)
Calcium: 7.9 mg/dL — ABNORMAL LOW (ref 8.9–10.3)
Chloride: 102 mmol/L (ref 98–111)
Creatinine, Ser: 0.8 mg/dL (ref 0.44–1.00)
GFR, Estimated: >60 mL/min (ref >60)
Glucose, Bld: 306 mg/dL — ABNORMAL HIGH (ref 70–99)
Potassium: 4.1 mmol/L (ref 3.5–5.1)
Sodium: 135 mmol/L (ref 135–145)
Total Bilirubin: 0.8 mg/dL (ref 0.3–1.2)
Total Protein: 6.2 g/dL — ABNORMAL LOW (ref 6.5–8.1)

## 2021-01-18 LAB — D-DIMER, QUANTITATIVE: D-Dimer, Quant: 1.89 ug/mL-FEU — ABNORMAL HIGH (ref 0.00–0.50)

## 2021-01-18 LAB — C-REACTIVE PROTEIN: CRP: 1.8 mg/dL — ABNORMAL HIGH (ref <1.0)

## 2021-01-18 MED ORDER — INSULIN GLARGINE 100 UNIT/ML ~~LOC~~ SOLN
10.0000 [IU] | Freq: Every day | SUBCUTANEOUS | Status: DC
  Administered 2021-01-18 – 2021-01-19 (×2): 10 [IU] via SUBCUTANEOUS
  Filled 2021-01-18 (×2): qty 0.1

## 2021-01-18 MED ORDER — METHYLPREDNISOLONE SODIUM SUCC 40 MG IJ SOLR
40.0000 mg | Freq: Two times a day (BID) | INTRAMUSCULAR | Status: AC
  Administered 2021-01-18 – 2021-01-21 (×7): 40 mg via INTRAVENOUS
  Filled 2021-01-18 (×7): qty 1

## 2021-01-18 MED ORDER — IPRATROPIUM-ALBUTEROL 20-100 MCG/ACT IN AERS
1.0000 | INHALATION_SPRAY | Freq: Two times a day (BID) | RESPIRATORY_TRACT | Status: DC
  Administered 2021-01-18 – 2021-01-27 (×19): 1 via RESPIRATORY_TRACT

## 2021-01-18 MED ORDER — INSULIN ASPART 100 UNIT/ML ~~LOC~~ SOLN
0.0000 [IU] | Freq: Three times a day (TID) | SUBCUTANEOUS | Status: DC
  Administered 2021-01-18: 11 [IU] via SUBCUTANEOUS
  Administered 2021-01-18: 8 [IU] via SUBCUTANEOUS
  Administered 2021-01-19 (×2): 11 [IU] via SUBCUTANEOUS
  Administered 2021-01-20 (×2): 8 [IU] via SUBCUTANEOUS
  Administered 2021-01-20: 5 [IU] via SUBCUTANEOUS
  Administered 2021-01-21 (×2): 8 [IU] via SUBCUTANEOUS
  Administered 2021-01-21: 11 [IU] via SUBCUTANEOUS
  Administered 2021-01-22 – 2021-01-23 (×4): 5 [IU] via SUBCUTANEOUS
  Administered 2021-01-24 (×2): 3 [IU] via SUBCUTANEOUS
  Administered 2021-01-25: 5 [IU] via SUBCUTANEOUS
  Administered 2021-01-25: 3 [IU] via SUBCUTANEOUS
  Administered 2021-01-26: 5 [IU] via SUBCUTANEOUS
  Administered 2021-01-26: 2 [IU] via SUBCUTANEOUS
  Administered 2021-01-26: 3 [IU] via SUBCUTANEOUS
  Administered 2021-01-27: 11 [IU] via SUBCUTANEOUS
  Administered 2021-01-27: 2 [IU] via SUBCUTANEOUS

## 2021-01-18 NOTE — Progress Notes (Signed)
PROGRESS NOTE    [name]  DSK:[id number] DOB: 1941/07/21 DOA: 01/16/2021 PCP: Kathyrn Lass, MD    Brief Narrative:  Elizabeth Melton is a 80 y.o. female with medical history significant of ILD (ANA positive) who was previously on nocturnal oxygen but has been off for some time, history of saddle PE recently stopped Eliquis, essential hypertension, hypothyroidism, hyperlipidemia, type 2 diabetes mellitus who presented to the ED with progressive shortness of breath and intermittent dizziness over the past week.  Patient initially thought it was related to her Eliquis, she contacted her pulmonologist who elected to have patient stop her anticoagulant.  Patient symptoms continue to progress now associated with decreased appetite, myalgias.  Patient denies any sick contacts, no recent travel and no other changes in medication regimen.  Patient specifically denies visual changes, no chest pain, no palpitations, no nausea/vomiting/diarrhea, no abdominal pain, no chills/night sweats, no paresthesias.  In the ED, Temperature 99.2, HR 58, RR 17, BP 158/55, SPO2 78% on room air.  WBC 3.8, hemoglobin 15.7, platelet 107.  Sodium 138, potassium 4.0, chloride 101, CO2 27, BUN 14, creatinine 0.67.  LDH 495, troponin XIII, ferritin 3688, CRP 3.5, lactic acid 1.8, procalcitonin 0.10.  D-dimer 2.76.  Covid-19/SARS-CoV-2 positive.  Blood cultures x2 ordered.  Chest x-ray with stable diffuse interstitial densities bilaterally.  CT angiogram chest pending.  Hospitalist service consulted for further evaluation and management of acute hypoxic respite failure secondary to Covid-19 viral pneumonia.   Assessment & Plan:   Principal Problem:   Pneumonia due to COVID-19 virus Active Problems:   Hypothyroidism   Hyperlipidemia   Diabetes mellitus without complication ([place])   Essential (primary) hypertension   ILD (interstitial lung disease) ([place])   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H[facility])   Acute respiratory  failure with hypoxia ([place])   Acute hypoxic respiratory failure secondary to acute Covid-19 viral pneumonia during the ongoing Covid 19 Pandemic - POA Patient presenting to ED with progressive shortness of breath with associated myalgias and decreased appetite over the last week.  Patient initially thought this was related to her home Eliquis, which she stopped taking on 01/13/2021 after discussion with her pulmonologist.  Patient is unvaccinated against Covid-19.  Patient denies any recent sick contacts.  Patient was found to be hypoxic with SPO2 77 on room air on arrival.  Chest x-ray consistent with multifocal pneumonia.  Covid-19/SARS-CoV-2 positive.  Procalcitonin within normal limits.  CT angiogram chest with no evidence of acute PE with increased superimposed ill-defined groundglass opacities and interlobar septal thickening consistent with multifocal pneumonia. --COVID test: + 01/16/2021 --CRP 3.5>3.4>1.8 --ddimer 2.76>3.39>1.89 --Remdesivir, plan 5-day course (Day #3/5) --Decrease Solumedrol to 4m IV q12h --prone for 2-3hrs every 12hrs if able --Continue supplemental oxygen, titrate to maintain SPO2 greater than 88%, on 2 L nasal cannula with SPO2 94% --Continue supportive care with albuterol MDI prn, vitamin C, zinc, Tylenol, antitussives (benzonatate/ Mucinex/Tussionex) --Follow CBC, CMP, D-dimer, and CRP daily --CTA chest pending given recent stop of her NOAC with history PE September 2021 --Continue airborne/contact isolation precautions  The treatment plan and use of medications and known side effects were discussed with patient/family. Some of the medications used are based on case reports/anecdotal data.  All other medications being used in the management of COVID-19 based on limited study data. Complete risks and long-term side effects are unknown, however in the best clinical judgment they seem to be of some benefit.  Patient wanted to proceed with treatment options  provided.  Recent saddle  pulmonary embolism Patient with recent large volume thrombus including a thin saddle PE with associated right heart strain on CTA 09/05/2020.  Patient was started on IV heparin at that time and PCCM and IR consulted for TPA consideration but patient declined.  Patient was on IV heparin for 48 hours and transition to Eliquis.  Patient recently stopped her Eliquis after contacting her pulmonology office as thought her current shortness of breath related to Eliquis use. CT angiogram chest negative for PE. --Continue home Eliquis  Essential hypertension Currently off antihypertensives.  BP 127/52 this am. --Hydralazine 25 mg p.o. every 6 hours as needed for SBP >180 or DBP >110 --Continue monitor blood pressure closely, if remains chronically elevated may initiate antihypertensive therapy  Hyperlipidemia On lovastatin 40 mg p.o. daily at home, will continue with pravastatin hospital substitution while inpatient  Interstitial lung disease History of pulmonary nodules Patient follows with pulmonology, Dr. [name] outpatient.  History of ANA positive.  Was previously on nocturnal oxygen, but has been off for some time.  CT high-resolution chest 08/28/2018 with scattered solid pulmonary nodules, largest 7 mm basilar right upper lobe which have been stable. --Continue Combivent inhaler as above --Continue supplemental oxygen, titrate to maintain SPO2 greater than 88% --Outpatient follow-up with pulmonology on discharge   DVT prophylaxis: Eliquis   Code Status: Full Code Family Communication: Updated patient extensively at bedside  Disposition Plan:  Level of care: Telemetry Status is: Inpatient  Remains inpatient appropriate because:Ongoing diagnostic testing needed not appropriate for outpatient work up, Unsafe d/c plan, IV treatments appropriate due to intensity of illness or inability to take PO and Inpatient level of care appropriate due to severity of  illness   Dispo: The patient is from: Home              Anticipated d/c is to: Home              Anticipated d/c date is: 3 days              Patient currently is not medically stable to d/c.   Difficult to place patient No   Consultants:   none  Procedures:   none  Antimicrobials:   none   Subjective: Patient seen and examined bedside, resting comfortably.  Continues on 2L [place].  Long discussion with patient this morning regarding benefits of receiving Covid-19 vaccination; she will continue to consider this over the next few weeks. No other questions or concerns at this time.  Denies headache, no visual changes, no chest pain, palpitations, no shortness of breath, no abdominal pain, no fever/chills/night sweats, no nausea/vomiting/diarrhea.  No acute concerns overnight per nursing staff.  Objective: Vitals:   01/17/21 0554 01/17/21 1342 01/17/21 2050 01/18/21 0407  BP: (!) 150/60 (!) 146/67 (!) 128/52 (!) 127/52  Pulse: 70 62 65 65  Resp: (!) _0 Temp: 97.7 F (36.5 C)  97.9 F (36.6 C) 98.3 F (36.8 C)  TempSrc: Oral  Oral   SpO2: 90% 98% 90% 94%  Weight:      Height:        Intake/Output Summary (Last 24 hours) at 01/18/2021 1204 Last data filed at 01/18/2021 0245 Gross per 24 hour  Intake 340 ml  Output 1000 ml  Net -660 ml   Filed Weights   01/16/21 1312  Weight: 80.7 kg    Examination:  General exam: Appears calm and comfortable  Respiratory system: Clear to auscultation. Respiratory effort normal.  On 2 L nasal  cannula with SPO2 94% Cardiovascular system: S1 & S2 heard, RRR. No JVD, murmurs, rubs, gallops or clicks. No pedal edema. Gastrointestinal system: Abdomen is nondistended, soft and nontender. No organomegaly or masses felt. Normal bowel sounds heard. Central nervous system: Alert and oriented. No focal neurological deficits. Extremities: Symmetric 5 x 5 power. Skin: No rashes, lesions or ulcers Psychiatry: Judgement and insight appear  normal. Mood & affect appropriate.     Data Reviewed: I have personally reviewed following labs and imaging studies  CBC: Recent Labs  Lab 01/16/21 1318 01/16/21 1622 01/17/21 0319 01/18/21 0401  WBC 3.8* 2.9* 2.6* 7.5  NEUTROABS  --  1.4* 1.8 6.0  HGB 15.7* 14.4 15.0 14.2  HCT 48.2* 42.9 46.2* 43.6  MCV 100.2* 98.2 100.2* 100.2*  PLT 107* 96* 108* 660*   Basic Metabolic Panel: Recent Labs  Lab 01/16/21 1356 01/17/21 0319 01/18/21 0401  NA 138 137 135  K 4.0 4.0 4.1  CL 101 102 102  CO2 _0 GLUCOSE 130* 230* 306*  BUN 14 16 24*  CREATININE 0.67 0.74 0.80  CALCIUM 8.2* 7.9* 7.9*  MG  --  2.2  --    GFR: Estimated Creatinine Clearance: 62.1 mL/min (by C-G formula based on SCr of 0.8 mg/dL). Liver Function Tests: Recent Labs  Lab 01/16/21 1356 01/17/21 0319 01/18/21 0401  AST 82* 74* 57*  ALT 37 34 31  ALKPHOS 54 49 50  BILITOT 0.9 0.7 0.8  PROT 7.4 6.5 6.2*  ALBUMIN 3.1* 2.7* 2.6*   No results for input(s): LIPASE, AMYLASE in the last 168 hours. No results for input(s): AMMONIA in the last 168 hours. Coagulation Profile: No results for input(s): INR, PROTIME in the last 168 hours. Cardiac Enzymes: No results for input(s): CKTOTAL, CKMB, CKMBINDEX, TROPONINI in the last 168 hours. BNP (last 3 results) No results for input(s): PROBNP in the last 8760 hours. HbA1C: Recent Labs    01/16/21 1655  HGBA1C 7.6*   CBG: Recent Labs  Lab 01/17/21 1141 01/17/21 1639 01/17/21 2051 01/18/21 0750 01/18/21 1149  GLUCAP 256* 237* 255* 249* 328*   Lipid Profile: Recent Labs    01/16/21 1356  TRIG 95   Thyroid Function Tests: Recent Labs    01/16/21 1655  TSH 2.081   Anemia Panel: Recent Labs    01/16/21 1356  FERRITIN 3,688*   Sepsis Labs: Recent Labs  Lab 01/16/21 1356 01/16/21 1556  PROCALCITON 0.10  --   LATICACIDVEN 1.8 1.4    Recent Results (from the past 240 hour(s))  SARS Coronavirus 2 by RT PCR (hospital order, performed  in [place] hospital lab) Nasopharyngeal Nasopharyngeal Swab     Status: Abnormal   Collection Time: 01/16/21  1:56 PM   Specimen: Nasopharyngeal Swab  Result Value Ref Range Status   SARS Coronavirus 2 POSITIVE (A) NEGATIVE Final    Comment: RESULT CALLED TO, READ BACK BY AND VERIFIED WITH: RIVERS,TJ AT 1552 ON 01/16/2021 BY JPM (NOTE) SARS-CoV-2 target nucleic acids are DETECTED  SARS-CoV-2 RNA is generally detectable in upper respiratory specimens  during the acute phase of infection.  Positive results are indicative  of the presence of the identified virus, but do not rule out bacterial infection or co-infection with other pathogens not detected by the test.  Clinical correlation with patient history and  other diagnostic information is necessary to determine patient infection status.  The expected result is negative.  Fact Sheet for Patients:   StrictlyIdeas.no   Fact Sheet  for Healthcare Providers:   BankingDealers.co.za    This test is not yet approved or cleared by the [place] and  has been authorized for detection and/or diagnosis of SARS-CoV-2 by FDA under an Emergency Use Authorization (EUA).  This EUA will remain in effect (meaning this  test can be used) for the duration of  the COVID-19 declaration under Section 564(b)(1) of the Act, 21 U.S.C. section 360-bbb-3(b)(1), unless the authorization is terminated or revoked sooner.  Performed at [facility], [place] [address], [place], [place] [address]   Blood Culture (routine x 2)     Status: None (Preliminary result)   Collection Time: 01/16/21  2:01 PM   Specimen: BLOOD  Result Value Ref Range Status   Specimen Description   Final    BLOOD RIGHT ANTECUBITAL Performed at [facility] [address] [place], [place] [address]    Special Requests   Final    BOTTLES DRAWN AEROBIC AND ANAEROBIC Blood Culture adequate  volume Performed at [facility] [address], [place], [place] [address]    Culture   Final    NO GROWTH 2 DAYS Performed at [facility] [address] [place], [place] [address]    Report Status PENDING  Incomplete  Blood Culture (routine x 2)     Status: None (Preliminary result)   Collection Time: 01/16/21  2:30 PM   Specimen: BLOOD  Result Value Ref Range Status   Specimen Description   Final    BLOOD LEFT ANTECUBITAL BOTTLES DRAWN AEROBIC AND ANAEROBIC Blood Culture adequate volume Performed at [facility] [address], [place], [place] [address]    Special Requests   Final    BOTTLES DRAWN AEROBIC AND ANAEROBIC Blood Culture adequate volume   Culture   Final    NO GROWTH 2 DAYS Performed at [facility], [place] [address], [place], [place] [address]    Report Status PENDING  Incomplete         Radiology Studies: DG Chest 2 View  Result Date: 01/16/2021 CLINICAL DATA:  Shortness of breath. EXAM: CHEST - 2 VIEW COMPARISON:  September 07, 2020. FINDINGS: Stable cardiomegaly. No pneumothorax or pleural effusion is noted. Stable diffuse interstitial densities are noted which may represent chronic interstitial lung disease, but acute superimposed edema or inflammation cannot be excluded. Bony thorax is unremarkable. IMPRESSION: Stable diffuse interstitial densities are noted which may represent chronic interstitial lung disease, but acute superimposed edema or inflammation cannot be excluded. Aortic Atherosclerosis (ICD10-I70.0). Electronically Signed   By: Marijo Conception M.D.   On: 01/16/2021 13:47   CT Angio Chest PE W and/or Wo Contrast  Result Date: 01/16/2021 CLINICAL DATA:  COVID.  History of ILD EXAM: CT ANGIOGRAPHY CHEST WITH CONTRAST TECHNIQUE: Multidetector CT imaging of the chest was performed using the standard protocol during bolus administration of intravenous contrast. Multiplanar CT image reconstructions and  MIPs were obtained to evaluate the vascular anatomy. CONTRAST:  15m OMNIPAQUE IOHEXOL 350 MG/ML SOLN COMPARISON:  September 05, 2020, October 20, 2019 FINDINGS: Cardiovascular: Limited evaluation of the bases secondary to respiratory motion. Satisfactory opacification of the pulmonary arteries to the segmental level. No evidence of pulmonary embolism. Heart is enlarged. No pericardial effusion. Calcifications of the aortic valve. Atherosclerotic calcifications of the aorta. Predominately LEFT-sided coronary artery atherosclerotic calcifications. Mediastinum/Nodes: Thyroid is unremarkable. Calcified subcarinal lymph node consistent with sequela of prior granulomatous infection. Revisualization of mediastinal and hilar  adenopathy. Lymph node borders are difficult to ascertain. Pretracheal lymph node measures 15 mm in the short axis, previously 9 mm in 2020 (series 5, image 57). RIGHT infrahilar lymph node measures 12 mm in the short axis (series 5, image 116). Preaortic lymph node measures 8 mm in the short axis, previously 5 mm in 2020 (series 4, image 41). Lungs/Pleura: No pneumothorax. Subpleural reticulation, traction bronchiectasis and honeycombing are similar in comparison to prior study from 2020. However there is increased superimposed ill-defined ground-glass opacities and interlobular septal thickening in comparison to prior. Ground-glass opacities are in a predominantly peripheral distribution. Increased bronchial wall thickening in comparison to prior. Debris within the trachea. More focal nodular opacity of the RIGHT upper lobe along the fissure measures 7 by 7 mm, unchanged in comparison to prior (series 6, image 65). Upper Abdomen: Small hiatal hernia.  No acute findings. Musculoskeletal: Degenerative changes of the thoracic spine. Review of the MIP images confirms the above findings. IMPRESSION: 1. No evidence of acute pulmonary embolism. 2. Increased superimposed ill-defined ground-glass opacities  and interlobular septal thickening in comparison to prior study from 2020. Findings are consistent with superimposed COVID 19 infection on a background of interstitial lung disease. 3. Mediastinal and hilar adenopathy, likely reactive. Aortic Atherosclerosis (ICD10-I70.0). Electronically Signed   By: Valentino Saxon MD   On: 01/16/2021 16:50        Scheduled Meds: . apixaban  5 mg Oral BID  . vitamin C  500 mg Oral Daily  . insulin aspart  0-15 Units Subcutaneous TID WC  . insulin glargine  10 Units Subcutaneous Daily  . Ipratropium-Albuterol  1 puff Inhalation BID  . levothyroxine  75 mcg Oral QAC breakfast  . methylPREDNISolone (SOLU-MEDROL) injection  40 mg Intravenous Q12H  . pravastatin  40 mg Oral q1800  . umeclidinium bromide  1 puff Inhalation Daily  . zinc sulfate  220 mg Oral Daily   Continuous Infusions: . remdesivir 100 mg in NS 100 mL 100 mg (01/18/21 1005)     LOS: 2 days    Time spent: 41 minutes spent on chart review, discussion with nursing staff, consultants, updating family and interview/physical exam; more than 50% of that time was spent in counseling and/or coordination of care.    Eliud Polo J [place] DO [facility] Available via Epic secure chat 7am-7pm After these hours, please refer to coverage provider listed on [email or web address] 01/18/2021, 12:04 PM

## 2021-01-18 NOTE — TOC Initial Note (Signed)
Transition of Care [facility]) - Initial/Assessment Note    Patient Details  Name: Elizabeth Melton MRN: [id number] Date of Birth: 19-Nov-1941  Transition of Care [facility]) CM/SW Contact:    Trish Mage, LCSW Phone Number: 01/18/2021, 10:48 AM  Clinical Narrative:  Followed up with patient in response to PT recommendation of HH PT.  Ms Harshman states she lives at home with daughter, has all needed DME, and is open to [facility] referral.  No preference of agencies.  Contacted Cindie with [facility] who agreed to service patient in home for [facility] PT. TOC will continue to follow during the course of hospitalization.                  Expected Discharge Plan: [place] Barriers to Discharge: No Barriers Identified   Patient Goals and CMS Choice     Choice offered to / list presented to : Patient  Expected Discharge Plan and Services Expected Discharge Plan: [place]   Discharge Planning Services: CM Consult Post Acute Care Choice: [place] arrangements for the past 2 months: Single Family Home                                      Prior Living Arrangements/Services Living arrangements for the past 2 months: Single Family Home Lives with:: Adult Children Patient language and need for interpreter reviewed:: Yes Do you feel safe going back to the place where you live?: Yes      Need for Family Participation in Patient Care: Yes (Comment) Care giver support system in place?: Yes (comment) Current home services: DME Criminal Activity/Legal Involvement Pertinent to Current Situation/Hospitalization: No - Comment as needed  Activities of Daily Living Home Assistive Devices/Equipment: Eyeglasses ADL Screening (condition at time of admission) Patient's cognitive ability adequate to safely complete daily activities?: Yes Is the patient deaf or have difficulty hearing?: No Does the patient have difficulty seeing, even when wearing glasses/contacts?:  No Does the patient have difficulty concentrating, remembering, or making decisions?: No Patient able to express need for assistance with ADLs?: Yes Does the patient have difficulty dressing or bathing?: No Independently performs ADLs?: Yes (appropriate for developmental age) Does the patient have difficulty walking or climbing stairs?: No Weakness of Legs: Both Weakness of Arms/Hands: None  Permission Sought/Granted                  Emotional Assessment Appearance:: Appears stated age Attitude/Demeanor/Rapport: Engaged Affect (typically observed): Appropriate Orientation: : Oriented to Self,Oriented to Place,Oriented to  Time,Oriented to Situation Alcohol / Substance Use: Not Applicable Psych Involvement: No (comment)  Admission diagnosis:  Acute respiratory failure with hypoxia ([place]) [J96.01] Pneumonia due to COVID-19 virus [U07.1, J12.82] COVID-19 [U07.1] Patient Active Problem List   Diagnosis Date Noted  . Pneumonia due to COVID-19 virus 01/16/2021  . Acute respiratory failure with hypoxia ([place]) 09/06/2020  . 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[place]) 09/05/2020  . ILD (interstitial lung disease) ([place]) 10/23/2019  . Chronic respiratory failure with hypoxia ([place]) 10/23/2019  . Essential (primary) hypertension 09/18/2018  . PAD (peripheral artery disease) ([place]) 09/18/2018  . Tobacco abuse 09/18/2018  . Aortic regurgitation 09/18/2018  . Hypothyroidism   . Hyperlipidemia   . Diabetes mellitus without complication ([place])   . Lower extremity pain 09/24/2012   PCP:  Kathyrn Lass, MD Pharmacy:   [id number] [place], [place] -  [address] [address] Phone: [phone] Fax: [phone]  [facility] - [address], [place] [address] Phone: [phone] Fax: [phone]     Social Determinants of Health (SDOH) Interventions    Readmission Risk  Interventions Readmission Risk Prevention Plan 09/10/2020  Post Dischage Appt Complete  Medication Screening Complete  Transportation Screening Complete  Some recent data might be hidden

## 2021-01-18 NOTE — Progress Notes (Signed)
Inpatient Diabetes Program Recommendations  AACE/ADA: New Consensus Statement on Inpatient Glycemic Control (2015)  Target Ranges:  Prepandial:   less than 140 mg/dL      Peak postprandial:   less than 180 mg/dL (1-2 hours)      Critically ill patients:  140 - 180 mg/dL   Lab Results  Component Value Date   GLUCAP 328 (H) 01/18/2021   HGBA1C 7.6 (H) 01/16/2021    Review of Glycemic Control  Diabetes history: DM2 Outpatient Diabetes medications: None Current orders for Inpatient glycemic control: Lantus 10 units QD, Novolog 0-15 units, Novolog 0-15 units TID with meals  On Solumedrol 40 mg Q12H HgbA1C - 7.6% CBGs: 249, 328   Glucose 306  Inpatient Diabetes Program Recommendations:     Add Novolog 0-5 units QHS Add Novolog 3 units TID with meals  Continue to follow.  Thank you. Lorenda Peck, RD, LDN, CDE Inpatient Diabetes Coordinator [phone]

## 2021-01-19 ENCOUNTER — Other Ambulatory Visit: Payer: Self-pay

## 2021-01-19 DIAGNOSIS — U071 COVID-19: Principal | ICD-10-CM

## 2021-01-19 DIAGNOSIS — J1282 Pneumonia due to coronavirus disease 2019: Secondary | ICD-10-CM | POA: Diagnosis not present

## 2021-01-19 LAB — COMPREHENSIVE METABOLIC PANEL
ALT: 32 U/L (ref 0–44)
AST: 54 U/L — ABNORMAL HIGH (ref 15–41)
Albumin: 2.6 g/dL — ABNORMAL LOW (ref 3.5–5.0)
Alkaline Phosphatase: 50 U/L (ref 38–126)
Anion gap: 9 (ref 5–15)
BUN: 21 mg/dL (ref 8–23)
CO2: 25 mmol/L (ref 22–32)
Calcium: 8.4 mg/dL — ABNORMAL LOW (ref 8.9–10.3)
Chloride: 105 mmol/L (ref 98–111)
Creatinine, Ser: 0.69 mg/dL (ref 0.44–1.00)
GFR, Estimated: >60 mL/min (ref >60)
Glucose, Bld: 258 mg/dL — ABNORMAL HIGH (ref 70–99)
Potassium: 4.3 mmol/L (ref 3.5–5.1)
Sodium: 139 mmol/L (ref 135–145)
Total Bilirubin: 0.8 mg/dL (ref 0.3–1.2)
Total Protein: 6.4 g/dL — ABNORMAL LOW (ref 6.5–8.1)

## 2021-01-19 LAB — GLUCOSE, CAPILLARY
Glucose-Capillary: 348 mg/dL — ABNORMAL HIGH (ref 70–99)
Glucose-Capillary: 322 mg/dL — ABNORMAL HIGH (ref 70–99)
Glucose-Capillary: 107 mg/dL — ABNORMAL HIGH (ref 70–99)
Glucose-Capillary: 90 mg/dL (ref 70–99)
Glucose-Capillary: 155 mg/dL — ABNORMAL HIGH (ref 70–99)

## 2021-01-19 LAB — BASIC METABOLIC PANEL
Anion gap: 9 (ref 5–15)
BUN: 32 mg/dL — ABNORMAL HIGH (ref 8–23)
CO2: 23 mmol/L (ref 22–32)
Calcium: 8.4 mg/dL — ABNORMAL LOW (ref 8.9–10.3)
Chloride: 109 mmol/L (ref 98–111)
Creatinine, Ser: 1.12 mg/dL — ABNORMAL HIGH (ref 0.44–1.00)
GFR, Estimated: 50 mL/min — ABNORMAL LOW (ref >60)
Glucose, Bld: 113 mg/dL — ABNORMAL HIGH (ref 70–99)
Potassium: 4.5 mmol/L (ref 3.5–5.1)
Sodium: 141 mmol/L (ref 135–145)

## 2021-01-19 LAB — CBC WITH DIFFERENTIAL/PLATELET
Abs Immature Granulocytes: 0.09 10*3/uL — ABNORMAL HIGH (ref 0.00–0.07)
Basophils Absolute: 0.1 10*3/uL (ref 0.0–0.1)
Basophils Relative: 0 %
Eosinophils Absolute: 0.1 10*3/uL (ref 0.0–0.5)
Eosinophils Relative: 1 %
HCT: 46.5 % — ABNORMAL HIGH (ref 36.0–46.0)
Hemoglobin: 15.1 g/dL — ABNORMAL HIGH (ref 12.0–15.0)
Immature Granulocytes: 1 %
Lymphocytes Relative: 10 %
Lymphs Abs: 1.3 10*3/uL (ref 0.7–4.0)
MCH: 32.4 pg (ref 26.0–34.0)
MCHC: 32.5 g/dL (ref 30.0–36.0)
MCV: 99.8 fL (ref 80.0–100.0)
Monocytes Absolute: 0.6 10*3/uL (ref 0.1–1.0)
Monocytes Relative: 5 %
Neutro Abs: 10.4 10*3/uL — ABNORMAL HIGH (ref 1.7–7.7)
Neutrophils Relative %: 83 %
Platelets: 161 10*3/uL (ref 150–400)
RBC: 4.66 MIL/uL (ref 3.87–5.11)
RDW: 14.2 % (ref 11.5–15.5)
WBC: 12.5 10*3/uL — ABNORMAL HIGH (ref 4.0–10.5)
nRBC: 0.8 % — ABNORMAL HIGH (ref 0.0–0.2)

## 2021-01-19 LAB — TROPONIN I (HIGH SENSITIVITY)
Troponin I (High Sensitivity): 400 ng/L (ref <18)
Troponin I (High Sensitivity): 746 ng/L (ref <18)

## 2021-01-19 LAB — MAGNESIUM: Magnesium: 2.4 mg/dL (ref 1.7–2.4)

## 2021-01-19 LAB — MRSA PCR SCREENING: MRSA by PCR: NEGATIVE

## 2021-01-19 LAB — D-DIMER, QUANTITATIVE: D-Dimer, Quant: 1.63 ug/mL-FEU — ABNORMAL HIGH (ref 0.00–0.50)

## 2021-01-19 LAB — C-REACTIVE PROTEIN: CRP: 0.7 mg/dL (ref <1.0)

## 2021-01-19 MED ORDER — METOPROLOL TARTRATE 5 MG/5ML IV SOLN
5.0000 mg | INTRAVENOUS | Status: AC | PRN
  Administered 2021-01-19 (×3): 5 mg via INTRAVENOUS
  Filled 2021-01-19 (×3): qty 5

## 2021-01-19 MED ORDER — LACTATED RINGERS IV BOLUS
1000.0000 mL | Freq: Once | INTRAVENOUS | Status: AC
  Administered 2021-01-19: 1000 mL via INTRAVENOUS

## 2021-01-19 MED ORDER — AMIODARONE HCL IN DEXTROSE 360-4.14 MG/200ML-% IV SOLN
60.0000 mg/h | INTRAVENOUS | Status: AC
  Administered 2021-01-19 (×2): 60 mg/h via INTRAVENOUS
  Filled 2021-01-19: qty 200

## 2021-01-19 MED ORDER — NITROGLYCERIN 0.4 MG SL SUBL: 0.4000 mg | SUBLINGUAL_TABLET | SUBLINGUAL | Status: DC | PRN

## 2021-01-19 MED ORDER — AMIODARONE HCL IN DEXTROSE 360-4.14 MG/200ML-% IV SOLN
30.0000 mg/h | INTRAVENOUS | Status: DC
  Administered 2021-01-20 – 2021-01-22 (×6): 30 mg/h via INTRAVENOUS
  Filled 2021-01-19 (×8): qty 200

## 2021-01-19 MED ORDER — ASPIRIN 81 MG PO CHEW
81.0000 mg | CHEWABLE_TABLET | Freq: Every day | ORAL | Status: DC
  Administered 2021-01-19 – 2021-01-22 (×4): 81 mg via ORAL
  Filled 2021-01-19 (×4): qty 1

## 2021-01-19 MED ORDER — AMIODARONE LOAD VIA INFUSION
150.0000 mg | Freq: Once | INTRAVENOUS | Status: AC
  Administered 2021-01-19: 150 mg via INTRAVENOUS
  Filled 2021-01-19: qty 83.34

## 2021-01-19 MED ORDER — METOPROLOL TARTRATE 25 MG PO TABS
25.0000 mg | ORAL_TABLET | Freq: Three times a day (TID) | ORAL | Status: DC
  Administered 2021-01-19: 25 mg via ORAL
  Filled 2021-01-19: qty 1

## 2021-01-19 MED ORDER — INSULIN GLARGINE 100 UNIT/ML ~~LOC~~ SOLN
15.0000 [IU] | Freq: Every day | SUBCUTANEOUS | Status: DC
  Administered 2021-01-20: 15 [IU] via SUBCUTANEOUS
  Filled 2021-01-19 (×2): qty 0.15

## 2021-01-19 MED ORDER — ORAL CARE MOUTH RINSE
15.0000 mL | Freq: Two times a day (BID) | OROMUCOSAL | Status: DC
  Administered 2021-01-19 – 2021-01-27 (×15): 15 mL via OROMUCOSAL

## 2021-01-19 MED ORDER — AMIODARONE IV BOLUS ONLY 150 MG/100ML
150.0000 mg | Freq: Once | INTRAVENOUS | Status: AC
  Administered 2021-01-19: 150 mg via INTRAVENOUS
  Filled 2021-01-19: qty 100

## 2021-01-19 MED ORDER — DILTIAZEM LOAD VIA INFUSION
10.0000 mg | Freq: Once | INTRAVENOUS | Status: AC
  Administered 2021-01-19: 10 mg via INTRAVENOUS
  Filled 2021-01-19: qty 10

## 2021-01-19 MED ORDER — CHLORHEXIDINE GLUCONATE CLOTH 2 % EX PADS
6.0000 | MEDICATED_PAD | Freq: Every day | CUTANEOUS | Status: DC
  Administered 2021-01-19 – 2021-01-27 (×8): 6 via TOPICAL

## 2021-01-19 MED ORDER — DILTIAZEM HCL-DEXTROSE 125-5 MG/125ML-% IV SOLN (PREMIX)
5.0000 mg/h | INTRAVENOUS | Status: DC
  Administered 2021-01-19: 5 mg/h via INTRAVENOUS
  Filled 2021-01-19: qty 125

## 2021-01-19 NOTE — Progress Notes (Signed)
   01/19/21 1827  Vitals  BP 100/67  MAP (mmHg) 79  Pulse Rate (!) 122  ECG Heart Rate (!) 110  Resp (!) 36  MEWS COLOR  MEWS Score Color Red  Oxygen Therapy  SpO2 93 %  O2 Device Nasal Cannula  O2 Flow Rate (L/min) 2 L/min  Report given to [employer]. Patient still showing atrial fibrillation on the monitor. Transferred to 1425 with charge RN Abby.  Patient alert and oriented. " Denying dizziness/sob and endorses feeling a lot better." Denying having any needs. Daughter called to provide updates. Per daughter she was currently on the line with the MD.

## 2021-01-19 NOTE — Progress Notes (Signed)
Blood pressure 82/48 with a map of 55.  Patient has LR bolus going. Along with Amiodorone going per [facility] @  [place] noted profusely. Asked patient if she was having chest pain, patient unable to describe region of pain. Complaining mostly of back-pain. Made MD Florene Glen aware.

## 2021-01-19 NOTE — Plan of Care (Signed)
  Problem: Nutrition: Goal: Adequate nutrition will be maintained Outcome: Progressing   Problem: Elimination: Goal: Will not experience complications related to bowel motility Outcome: Progressing   Problem: Safety: Goal: Ability to remain free from injury will improve Outcome: Progressing

## 2021-01-19 NOTE — Significant Event (Signed)
Rapid Response Event Note   Reason for Call :  Pt still in afib RVR and BP dropping in the 35D and 89B systolic  Initial Focused Assessment:  Neuro: A&O x4, says she feels worse than earlier today  Resp: RA satting 94%. RR 18 Cardiac: HR 140s-170s. BP 84R systolic (see flowsheets)  Interventions:  MD at bedside. Cardizem gtt stopped, NS bolus started Amio gtt ordered by Dr. Florene Glen Amio bolus given per order and continuous rate after that  Plan of Care:  CCM MD consulted by [facility] physician and came to bedside. Decision made to transfer to [place]. Upon this RN leaving bedside, pt HR was controlled between 90-110s. Still in afib. BP 84X systolic and above    Event Summary:   MD Notified: Dr. Florene Glen  Call Time: 2820 Arrival Time: 8138 End Time: [place], RN, BSN

## 2021-01-19 NOTE — Progress Notes (Signed)
01/19/21 1757  Vitals  BP (!) 86/56  MAP (mmHg) 66  Pulse Rate 71  ECG Heart Rate (!) 128  Resp 17  MEWS COLOR  MEWS Score Color Yellow  Oxygen Therapy  SpO2 99 %  O2 Device Nasal Cannula  O2 Flow Rate (L/min) 2 L/min  Placed on 2L of nasal cannula for comfort. Patient is alert and oriented x 4. Sweating resolved still complaining of pain, unsure of region. Still showing Atrial fibrillation on the monitor in the lower 120s. Amiodarone still going per [facility]. Given call bell. Will continue to monitor.

## 2021-01-19 NOTE — Progress Notes (Signed)
eLink Physician-Brief Progress Note Patient Name: Elizabeth Melton DOB: August 25, 1941 MRN: [id number]   Date of Service  01/19/2021  HPI/Events of Note  AFIB with RVR - Ventricular rte = 126-136. Troponin = 400. EKG revealss Atrial fibrillation. Low voltage QRS. Nonspecific ST and T wave abnormality. Demand ischemia?  eICU Interventions  Plan: 1. Rebolus with Amiodarone150 mg IV over 10 minutes now.  2. BMP and Mg++ level STAT. 3. ASA 80 mg PO now and Q day.  4. Continue to trend Troponin.       Intervention Category Major Interventions: Other:;Arrhythmia - evaluation and management  Lysle Dingwall 01/19/2021, 8:24 PM

## 2021-01-19 NOTE — Progress Notes (Signed)
Physical Therapy Treatment Patient Details Name: Elizabeth Melton MRN: [id number] DOB: 1941-10-18 Today's Date: 01/19/2021    History of Present Illness 80 yo female admitted with COVID. Hx of PE, DM, ILD, R LE DVT    PT Comments    Modified independent with supine to sit. In sitting pt's HR was 150-195, she reported mild dizziness. HR confirmed manually. SaO2 97% on room air. RN and MD verbally notified of HR. Further mobility deferred 2* tachycardia. Will follow.    Follow Up Recommendations  Home health PT;Supervision - Intermittent     Equipment Recommendations  None recommended by PT    Recommendations for Other Services       Precautions / Restrictions Precautions Precautions: Fall Precaution Comments: monitor HR Restrictions Weight Bearing Restrictions: No    Mobility  Bed Mobility Overal bed mobility: Modified Independent Bed Mobility: Supine to Sit;Sit to Supine     Supine to sit: Modified independent (Device/Increase time);HOB elevated Sit to supine: Modified independent (Device/Increase time)   General bed mobility comments: used bedrail. No assist needed. HR 150-195 in sitting so returned to supine where HR was 150s-170s. HR confirmed manually at wrist. SaO2 97% on room air. No dyspnea. Pt reported mild dizziness in sitting. RN and MD verbally notified of HR.  Transfers                    Ambulation/Gait             General Gait Details: deferred 2* tachycardia in sitting   Stairs             Wheelchair Mobility    Modified Rankin (Stroke Patients Only)       Balance     Sitting balance-Leahy Scale: Good                                      Cognition Arousal/Alertness: Awake/alert Behavior During Therapy: WFL for tasks assessed/performed Overall Cognitive Status: Within Functional Limits for tasks assessed                                        Exercises      General Comments         Pertinent Vitals/Pain Pain Assessment: No/denies pain    Home Living                      Prior Function            PT Goals (current goals can now be found in the care plan section) Acute Rehab PT Goals Patient Stated Goal: to get better and get home PT Goal Formulation: With patient Time For Goal Achievement: 01/31/21 Potential to Achieve Goals: Good Progress towards PT goals: Not progressing toward goals - comment (tachycardia limiting activity)    Frequency    Min 3X/week      PT Plan Current plan remains appropriate    Co-evaluation              AM-PAC PT "6 Clicks" Mobility   Outcome Measure  Help needed turning from your back to your side while in a flat bed without using bedrails?: None Help needed moving from lying on your back to sitting on the side of a flat bed without using bedrails?: None Help needed  moving to and from a bed to a chair (including a wheelchair)?: A Little Help needed standing up from a chair using your arms (e.g., wheelchair or bedside chair)?: A Little Help needed to walk in hospital room?: A Little Help needed climbing 3-5 steps with a railing? : A Little 6 Click Score: 20    End of Session   Activity Tolerance: Treatment limited secondary to medical complications (Comment) (tachycardia) Patient left: with call bell/phone within reach;in bed Nurse Communication: Mobility status;Other (comment) (tachycardia) PT Visit Diagnosis: Unsteadiness on feet (R26.81);Difficulty in walking, not elsewhere classified (R26.2)     Time: 1018-1030 PT Time Calculation (min) (ACUTE ONLY): 12 min  Charges:  $Therapeutic Activity: 8-22 mins                    Blondell Reveal Kistler PT 01/19/2021  Acute Rehabilitation Services Pager [phone] Office [phone]

## 2021-01-19 NOTE — Progress Notes (Signed)
Patient will be transferred to 4th floor (progressive unit). HR 120-163's after 3 doses of IV Lopressor 29m.

## 2021-01-19 NOTE — Progress Notes (Signed)
   01/19/21 1210  Assess: MEWS Score  Temp 98.3 F (36.8 C)  BP 118/66  Pulse Rate (!) 122  ECG Heart Rate (!) 152  Resp 20  Level of Consciousness Alert  SpO2 90 %  O2 Device Room Air  Assess: MEWS Score  MEWS Temp 0  MEWS Systolic 0  MEWS Pulse 3  MEWS RR 0  MEWS LOC 0  MEWS Score 3  MEWS Score Color Yellow  Assess: if the MEWS score is Yellow or Red  Were vital signs taken at a resting state? Yes  Focused Assessment No change from prior assessment  Early Detection of Sepsis Score *See Row Information* High  MEWS guidelines implemented *See Row Information* No, vital signs rechecked  Treat  MEWS Interventions Administered prn meds/treatments  Pain Scale 0-10  Pain Score 1  Pain Type Acute pain  Pain Location Head  Take Vital Signs  Increase Vital Sign Frequency  Yellow: Q 2hr X 2 then Q 4hr X 2, if remains yellow, continue Q 4hrs  Escalate  MEWS: Escalate Yellow: discuss with charge nurse/RN and consider discussing with provider and RRT (discussed with director)  Notify: Charge Nurse/RN  Name of Charge Nurse/RN Notified Kaushik Maul  Date Charge Nurse/RN Notified 01/19/21  Time Charge Nurse/RN Notified 1020  Notify: Provider  Provider Name/Title Florene Glen  Date Provider Notified 01/19/21  Time Provider Notified 1037  Notification Type Face-to-face  Notification Reason Other (Comment) (increase HR)  Response See new orders  Date of Provider Response 01/19/21  Time of Provider Response 1037  Notify: Rapid Response  Name of Rapid Response RN Notified Mel Almond  Date Rapid Response Notified 01/19/21  Time Rapid Response Notified 1448  Document  Patient Outcome Transferred/level of care increased

## 2021-01-19 NOTE — Progress Notes (Signed)
Patient arrived to unit from 4W with two belongings bag and purse. Patient began searching for her wallet, could not find it. Called both 5W and 4W where patient had previously been to attempt to find wallet. Could not find wallet. Wallet is noted to be in patient's belonging assessment upon admission.

## 2021-01-19 NOTE — Consult Note (Signed)
NAME:  Elizabeth Melton, MRN:  [id number], DOB:  03-Apr-1941, LOS: 3 ADMISSION DATE:  01/16/2021, CONSULTATION DATE: 01/19/2021 REFERRING MD: Fayrene Helper, MD, CHIEF COMPLAINT: Rapid atrial fibrillation, hypertension  Brief History:  80 year old with history of unspecified ILD on oxygen, PE on September 2021 [recently stopped Eliquis on 2/3 as an outpatient due to side effects] admitted with dyspnea, dizziness.  CTA on admission was negative for PE but showed bilateral infiltrates consistent with COVID-19 pneumonia which was treated with remdesivir and Solu-Medrol  She is doing well until today when she developed rapid atrial fibrillation to 160s.  She is given IV metoprolol and started on Cardizem drip, transferred to progressive care.  However today afternoon her blood pressure dropped while on Cardizem drip.  PCCM consulted for help with management  Cardizem drip has been stopped.  She is currently receiving 1 L LR and an amnio bolus with heart rate better controlled to 100s  Past Medical History:    has a past medical history of Diabetes mellitus without complication ([place]), Hyperlipidemia, Hypertension, Hypothyroidism, and Thrombocytopenia ([place]).  Significant Hospital Events:  2/6-admitted with COVID-19 pneumonia 2/9-rapid atrial fibrillation with hypotension  Consults:  PCCM  Procedures:    Significant Diagnostic Tests:   CTA 01/16/2021-mild groundglass opacities superimposed on background of interstitial lung disease, mediastinal and hilar lymphadenopathy.  Micro Data:    Antimicrobials:    Interim History / Subjective:     Objective   Blood pressure (!) 80/56, pulse (!) 157, temperature 98.5 F (36.9 C), temperature source Oral, resp. rate 16, height 5' 6" (1.676 m), weight 80.7 kg, SpO2 94 %.        Intake/Output Summary (Last 24 hours) at 01/19/2021 1741 Last data filed at 01/19/2021 0600 Gross per 24 hour  Intake 360 ml  Output --  Net 360 ml   Filed  Weights   01/16/21 1312  Weight: 80.7 kg    Examination: Blood pressure (!) 80/56, pulse (!) 157, temperature 98.5 F (36.9 C), temperature source Oral, resp. rate 16, height 5' 6" (1.676 m), weight 80.7 kg, SpO2 94 %. Gen:      Mild distress HEENT:  EOMI, sclera anicteric Neck:     No masses; no thyromegaly Lungs:    Scattered crackles CV:         Irregular, tachycardic Abd:      + bowel sounds; soft, non-tender; no palpable masses, no distension Ext:    No edema; adequate peripheral perfusion Skin:      Warm and dry; no rash Neuro: alert and oriented x 3  Resolved Hospital Problem list     Assessment & Plan:  Rapid atrial fibrillation with hypotension Stop metoprolol and Cardizem Amnio bolus and drip IV fluids with LR Transferring to stepdown/ICU.  May need cardioversion if she continues to be hypotensive Cardiology already consulted by primary team. Follow EKG, troponins.  COVID-19 pneumonia Continue remdesivir, Solu-Medrol.  She is currently on room air Awake proning, incentive spirometer, flutter valve  History of PE She had a brief interruption of therapy.  Back on anticoagulation with Eliquis  ILD, pulmonary nodule As needed nebs Follow-up as outpatient   Best practice (evaluated daily)  Diet: NPO Pain/Anxiety/Delirium protocol (if indicated): NA VAP protocol (if indicated): NA DVT prophylaxis: Eliquis GI prophylaxis: NA Glucose control: Monitor Mobility: Bed Disposition:ICU  Goals of Care:  Last date of multidisciplinary goals of care discussion:Pending Family and staff present: None Summary of discussion:  Follow up goals of care discussion due:  Code Status: Full  Labs   CBC: Recent Labs  Lab 01/16/21 1318 01/16/21 1622 01/17/21 0319 01/18/21 0401 01/19/21 0410  WBC 3.8* 2.9* 2.6* 7.5 12.5*  NEUTROABS  --  1.4* 1.8 6.0 10.4*  HGB 15.7* 14.4 15.0 14.2 15.1*  HCT 48.2* 42.9 46.2* 43.6 46.5*  MCV 100.2* 98.2 100.2* 100.2* 99.8  PLT 107*  96* 108* 138* 182    Basic Metabolic Panel: Recent Labs  Lab 01/16/21 1356 01/17/21 0319 01/18/21 0401 01/19/21 0410  NA 138 137 135 139  K 4.0 4.0 4.1 4.3  CL 101 102 102 105  CO2 _0 GLUCOSE 130* 230* 306* 258*  BUN 14 16 24* 21  CREATININE 0.67 0.74 0.80 0.69  CALCIUM 8.2* 7.9* 7.9* 8.4*  MG  --  2.2  --   --    GFR: Estimated Creatinine Clearance: 62.1 mL/min (by C-G formula based on SCr of 0.69 mg/dL). Recent Labs  Lab 01/16/21 1356 01/16/21 1556 01/16/21 1622 01/17/21 0319 01/18/21 0401 01/19/21 0410  PROCALCITON 0.10  --   --   --   --   --   WBC  --   --  2.9* 2.6* 7.5 12.5*  LATICACIDVEN 1.8 1.4  --   --   --   --     Liver Function Tests: Recent Labs  Lab 01/16/21 1356 01/17/21 0319 01/18/21 0401 01/19/21 0410  AST 82* 74* 57* 54*  ALT 37 34 31 32  ALKPHOS 54 49 50 50  BILITOT 0.9 0.7 0.8 0.8  PROT 7.4 6.5 6.2* 6.4*  ALBUMIN 3.1* 2.7* 2.6* 2.6*   No results for input(s): LIPASE, AMYLASE in the last 168 hours. No results for input(s): AMMONIA in the last 168 hours.  ABG No results found for: PHART, PCO2ART, PO2ART, HCO3, TCO2, ACIDBASEDEF, O2SAT   Coagulation Profile: No results for input(s): INR, PROTIME in the last 168 hours.  Cardiac Enzymes: No results for input(s): CKTOTAL, CKMB, CKMBINDEX, TROPONINI in the last 168 hours.  HbA1C: Hgb A1c MFr Bld  Date/Time Value Ref Range Status  01/16/2021 04:55 PM 7.6 (H) 4.8 - 5.6 % Final    Comment:    (NOTE) Pre diabetes:          5.7%-6.4%  Diabetes:              >6.4%  Glycemic control for   <7.0% adults with diabetes   09/09/2020 02:52 AM 7.3 (H) 4.8 - 5.6 % Final    Comment:    (NOTE) Pre diabetes:          5.7%-6.4%  Diabetes:              >6.4%  Glycemic control for   <7.0% adults with diabetes     CBG: Recent Labs  Lab 01/18/21 1718 01/18/21 2103 01/19/21 0744 01/19/21 1154 01/19/21 1705  GLUCAP 253* 254* 348* 322* 90    Review of Systems:   REVIEW OF  SYSTEMS:   All negative; except for those that are bolded, which indicate positives.  Constitutional: weight loss, weight gain, night sweats, fevers, chills, fatigue, weakness.  HEENT: headaches, sore throat, sneezing, nasal congestion, post nasal drip, difficulty swallowing, tooth/dental problems, visual complaints, visual changes, ear aches. Neuro: difficulty with speech, weakness, numbness, ataxia. CV:  chest pain, orthopnea, PND, swelling in lower extremities, dizziness, palpitations, syncope.  Resp: cough, hemoptysis, dyspnea, wheezing. GI: heartburn, indigestion, abdominal pain, nausea, vomiting, diarrhea, constipation, change in bowel habits, loss of appetite, hematemesis, melena,  hematochezia.  GU: dysuria, change in color of urine, urgency or frequency, flank pain, hematuria. MSK: joint pain or swelling, decreased range of motion. Psych: change in mood or affect, depression, anxiety, suicidal ideations, homicidal ideations. Skin: rash, itching, bruising.  Past Medical History:  She,  has a past medical history of Diabetes mellitus without complication ([place]), Hyperlipidemia, Hypertension, Hypothyroidism, and Thrombocytopenia ([place]).   Surgical History:   Past Surgical History:  Procedure Laterality Date  . TUBAL LIGATION       Social History:   reports that she has been smoking cigarettes. She has a 45.00 pack-year smoking history. She quit smokeless tobacco use about 58 years ago. She reports current alcohol use. She reports that she does not use drugs.   Family History:  Her family history includes Diabetes in her mother; Hyperlipidemia in her sister; Hypertension in her mother and sister; Kidney disease in her mother; Other in her father.   Allergies No Known Allergies   Home Medications  Prior to Admission medications   Medication Sig Start Date End Date Taking? Authorizing Provider  acetaminophen (TYLENOL) 500 MG tablet Take 500 mg by mouth daily as needed for headache  (pain).    Yes [provider]  apixaban (ELIQUIS) 5 MG TABS tablet Take 1 tablet (5 mg total) by mouth 2 (two) times daily. 10/11/20  Yes Mercy Riding, MD  b complex vitamins tablet Take 1 tablet by mouth daily.   Yes [provider]  Blood Glucose Monitoring Suppl (TRUE METRIX METER) w/Device KIT 1 each by Other route in the morning and at bedtime. 12/25/20  Yes [provider]  Calcium Carb-Cholecalciferol (CALCIUM + D3) 600-200 MG-UNIT TABS Take 2 tablets by mouth daily.    Yes [provider]  cholecalciferol (VITAMIN D3) 25 MCG (1000 UT) tablet Take 1,000 Units by mouth daily.   Yes [provider]  glucose blood test strip 1 each by Other route as needed for other (blood sugar).   Yes [provider]  levothyroxine (SYNTHROID, LEVOTHROID) 75 MCG tablet Take 75 mcg by mouth daily before breakfast.    Yes [provider]  lovastatin (MEVACOR) 40 MG tablet Take 40 mg by mouth at bedtime.   Yes [provider]  Omega-3 Fatty Acids (FISH OIL PO) Take 1 capsule by mouth daily.   Yes [provider]  SMART SENSE THIN LANCETS 26G MISC 1 each by Does not apply route 2 (two) times daily.   Yes [provider]  Tiotropium Bromide-Olodaterol (STIOLTO RESPIMAT) 2.5-2.5 MCG/ACT AERS Inhale 2 puffs into the lungs daily. 10/19/20  Yes Brand Males, MD     Critical care time:    The patient is critically ill with multiple organ system failure and requires high complexity decision making for assessment and support, frequent evaluation and titration of therapies, advanced monitoring, review of radiographic studies and interpretation of complex data.   Critical Care Time devoted to patient care services, exclusive of separately billable procedures, described in this note is 45 minutes.   Marshell Garfinkel MD [facility] See Amion for pager  If no response to pager , please call [phone]  until 7pm After 7:00 pm call Elink  [phone] 01/19/2021, 5:55 PM

## 2021-01-19 NOTE — Progress Notes (Signed)
CRITICAL VALUE ALERT  Critical Value: troponin 746 Date & Time Notied: 01/19/21 2150   Provider Notified: E-link notified  Orders Received/Actions taken: awaiting any new orders, EKG.

## 2021-01-19 NOTE — Progress Notes (Signed)
Inpatient Diabetes Program Recommendations  AACE/ADA: New Consensus Statement on Inpatient Glycemic Control (2015)  Target Ranges:  Prepandial:   less than 140 mg/dL      Peak postprandial:   less than 180 mg/dL (1-2 hours)      Critically ill patients:  140 - 180 mg/dL   Lab Results  Component Value Date   GLUCAP 348 (H) 01/19/2021   HGBA1C 7.6 (H) 01/16/2021    Review of Glycemic Control  Diabetes history: DM2 Outpatient Diabetes medications: None Current orders for Inpatient glycemic control: Lantus 10 units QD, Novolog 0-15 units TID with meals  HgbA1C - 7.6% Blood sugars above goal of 140-180 mg/dL  Inpatient Diabetes Program Recommendations:     Increase Lantus to 10 units BID Add Novolog 4 units TID with meals if eating > 50% meal Add Novolog HS correction  Continue to follow glucose trends.  Thank you. Lorenda Peck, RD, LDN, CDE Inpatient Diabetes Coordinator [phone]

## 2021-01-19 NOTE — Progress Notes (Signed)
Received report from [facility]. Awaiting room to be cleaned. And awaiting patient arrival to [address].

## 2021-01-19 NOTE — Progress Notes (Signed)
eLink Physician-Brief Progress Note Patient Name: Elizabeth Melton DOB: September 22, 1941 MRN: [id number]   Date of Service  01/19/2021  HPI/Events of Note  Multiple issues: 1. AFIB with RVR - HR = 110-140. BP = 102/70. K+ = 4.5 and Mg++ = 2.4 and 2. Patient c/o back pain radiating to neck. EKG repeated, however, not in Epic. Troponin = 400 --> 746. Clinical picture based on earlier EKG c/w NSTEMI vs demand ischemia. Patient is already anticoagulated with Eliquis.   eICU Interventions  Plan: 1. Amiodarone 150 mg IV over 10 minutes now.  2. NTG 1/150 gr SL Q 5 min x 3 PRN chest pain. Hold dose for SBP < 100.      Intervention Category Major Interventions: Other:  Orson Rho Cornelia Copa 01/19/2021, 10:12 PM

## 2021-01-19 NOTE — Plan of Care (Signed)
  Problem: Education: Goal: Knowledge of General Education information will improve Description: Including pain rating scale, medication(s)/side effects and non-pharmacologic comfort measures Outcome: Adequate for Discharge   Problem: Nutrition: Goal: Adequate nutrition will be maintained Outcome: Adequate for Discharge   Problem: Coping: Goal: Level of anxiety will decrease Outcome: Adequate for Discharge   Problem: Elimination: Goal: Will not experience complications related to bowel motility Outcome: Adequate for Discharge   Problem: Pain Managment: Goal: General experience of comfort will improve Outcome: Adequate for Discharge

## 2021-01-19 NOTE — Progress Notes (Incomplete)
[place] daughter

## 2021-01-19 NOTE — Significant Event (Signed)
Rapid Response Event Note   Reason for Call :  Called to bedside due to HR in the 130-160s  Initial Focused Assessment:  Neuro: A&o x 4, states she "feels bad" Resp: Clear/diminshed. RR 21, equal. 92% on RA Cards: BP 118/66 (84). HR 122   Interventions:  32m IV lopressor given per order (3rd dose). MD notified and progressive ordered placed. IV lopressor not effective. EKG showed afib RVR  Plan of Care:  MD wanting to transfer pt to progressive care due to 3 doses of 580mIV lopressor not effective at reducing HR   Event Summary:   MD Notified: Dr. PoFlorene GlenCall Time: [id number] Time: 1156 End Time: 12CliftonRN

## 2021-01-19 NOTE — Progress Notes (Signed)
Patient found wallet

## 2021-01-19 NOTE — Progress Notes (Signed)
   01/19/21 1518  Vitals  Temp 98.5 F (36.9 C)  Temp Source Oral  BP (!) 104/39  MAP (mmHg) (!) 51  BP Location Right Arm  BP Method Automatic  Pulse Rate (!) 170  Pulse Rate Source Monitor  Level of Consciousness  Level of Consciousness Alert  MEWS COLOR  MEWS Score Color Yellow  Oxygen Therapy  SpO2 98 %  O2 Device Room Air  Pulse Oximetry Type Intermittent  Pain Assessment  Pain Scale 0-10  Pain Score 0  Patient arrived to 1429. Alert and oriented x 4 via bed. Patient HR in the 160s-180s. Paged MD via secure method Powel Lamonte Sakai MD for further orders. Patient endorsing " I feel much better." Awaiting orders. Placed on purewick for safety. No other needs identified at this time. Will continue to monitor.

## 2021-01-19 NOTE — Progress Notes (Addendum)
PROGRESS NOTE    Elizabeth Melton  JJK:[id number] DOB: [birth year] DOA: 01/16/2021 PCP: Kathyrn Lass, MD  Chief Complaint  Patient presents with  . Shortness of Breath  . Dizziness    Brief Narrative: Elizabeth Melton Elizabeth Melton 80 y.o.femalewith medical history significant ofILD(ANA positive)who was previously on nocturnal oxygen but has been off for some time, history of saddle PE recently stopped Eliquis, essential hypertension, hypothyroidism, hyperlipidemia, type 2 diabetes mellitus who presented to the ED with progressive shortness of breath and intermittent dizziness over the past week. Patient initially thought it was related to her Eliquis, she contacted her pulmonologist who elected to have patient stop her anticoagulant. Patient symptoms continue to progress now associated with decreased appetite, myalgias.Patient denies any sick contacts, no recent travel and no other changes in medication regimen. Patient specifically denies visual changes, no chest pain, no palpitations, no nausea/vomiting/diarrhea, no abdominal pain, no chills/night sweats, no paresthesias.  She was admitted for acute hypoxic respiratory failure 2/2 COVID 19 pneumonia.   Assessment & Plan:   Principal Problem:   Pneumonia due to COVID-19 virus Active Problems:   Hypothyroidism   Hyperlipidemia   Diabetes mellitus without complication ([place])   Essential (primary) hypertension   ILD (interstitial lung disease) ([place])   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[place])   Acute respiratory failure with hypoxia ([place])  Addendum:  Continued RVR.  She failed metop.  Developed hypotension with diltiazem.  Bolus ordered.  Diltiazem d/c'd.  Start amiodarone.  Pressures down to the 37'J systolic.  Now improved to 80's.  Case discussed with PCCM.  May need cardioversion if doesn't improve with amio.   Atrial Fibrillation with RVR Rate up to the 170's-180's at times  S/p metoprolol IV 5 mg x3 with 25 PO q8 with  inadequate response (improved to 110's, but subsequently worsened) Continue metop 25 mg q8, will add dilt gtt EKG with atrial fib with RVR, T wave inversion II, III, aVF, ST depression from V3-V6.  Suspect rate related changes.  Denies CP.   TSH wnl 2/6.  Echo with grade II diastolic dysfunction.  Severely elevated PASP.   chadvasc at least 6, she's already on eliquis, continue Cardiology consult, appreciate assistance  Acute hypoxic respiratory failure secondary to acute Covid-19 viral pneumonia during the ongoing Covid 19 Pandemic - POA Progressive shortness of breath with associated myalgias and decreased appetite over the last week. Patient initially thought this was related to her home Eliquis, which she stopped taking on 01/13/2021 after discussion with her pulmonologist. Patient is unvaccinated against Covid-19.  -- Currently on RA -- CT PE protocol 2/6 without evidence of acute PE, superimposed covid 19 infection on [place] background of ILD, mediastinal and hilar adenopathy (likely reactive) --COVID test:+ 01/16/2021 --Remdesivir, plan 5-day course (Day#4/5) --Continue Solumedrolto 2m IV q12h -- I/O, daily weights -- prone as able, OOB, IS, flutter  COVID-19 Labs  Recent Labs    01/17/21 0319 01/18/21 0401 01/19/21 0410  DDIMER 3.39* 1.89* 1.63*  CRP 3.4* 1.8* 0.7    Lab Results  Component Value Date   SARSCOV2NAA POSITIVE (Henrik Orihuela) 01/16/2021   SWaianaeNEGATIVE 09/05/2020   SNewberryNEGATIVE 03/13/2020   SMays ChapelNOT DETECTED 10/20/2019   Recent saddle pulmonary embolism Patient with recent large volume thrombus including Elizabeth Melton thin saddle PE with associated right heart strain on CTA 09/05/2020. Patient was started on IV heparin at that time and PCCM and IR consulted for TPA consideration but patient declined. Patient was on IV heparin for 48 hours  and transition to Eliquis. Patient recently stopped her Eliquis after contacting her pulmonology office as thought her  current shortness of breath related to Eliquis use. CT angiogram chest negative for PE. --Continue home Eliquis  Essential hypertension Currently off antihypertensives.  Continue to monitor on AV nodal blockers  Hyperlipidemia On lovastatin 40 mg p.o. daily at home, will continue with pravastatin hospital substitution while inpatient  Interstitial lung disease History of pulmonary nodules Patient follows with pulmonology, Dr. [name] outpatient. History of ANA positive. Was previously on nocturnal oxygen, but has been off for some time. CT high-resolution chest 08/28/2018 with scattered solid pulmonary nodules, largest 7 mm basilar right upper lobe which have been stable. --Continue Combivent inhaler as above --Continue supplemental oxygen, titrate to maintain SPO2 greater than 88% --Outpatient follow-up with pulmonology on discharge  DVT prophylaxis: eliquis Code Status: full Family Communication: none at bedside Disposition:   Status is: Inpatient  Remains inpatient appropriate because:Inpatient level of care appropriate due to severity of illness   Dispo: The patient is from: Home              Anticipated d/c is to: Home              Anticipated d/c date is: > 3 days              Patient currently is not medically stable to d/c.   Difficult to place patient No  Consultants:   cardiology  Procedures:  Echo IMPRESSIONS    1. Left ventricular ejection fraction, by estimation, is 60 to 65%. The  left ventricle has normal function. The left ventricle has no regional  wall motion abnormalities. There is mild left ventricular hypertrophy.  Left ventricular diastolic parameters  are consistent with Grade II diastolic dysfunction (pseudonormalization).  Elevated left ventricular end-diastolic pressure. The E/e' is 24.  2. Right ventricular systolic function is normal. The right ventricular  size is normal. There is severely elevated pulmonary artery systolic   pressure. The estimated right ventricular systolic pressure is 03.4 mmHg.  3. Left atrial size was severely dilated.  4. Right atrial size was mildly dilated.  5. The mitral valve is grossly normal. Trivial mitral valve  regurgitation.  6. The aortic valve is tricuspid. Aortic valve regurgitation is mild.  Mild aortic valve stenosis. Aortic valve area, by VTI measures 1.71 cm.  Aortic valve mean gradient measures 8.0 mmHg. Aortic valve Vmax measures  2.17 m/s.  7. The inferior vena cava is normal in size with greater than 50%  respiratory variability, suggesting right atrial pressure of 3 mmHg.   Comparison(s): Changes from prior study are noted. 09/06/20: LVEF 60-65%,  RVSP 70 mmHG, severely dilated and hypokinetic RV. In my review of the  previous study, the RV does not appear significantly dilated or  hypokinetic.   Antimicrobials: Anti-infectives (From admission, onward)   Start     Dose/Rate Route Frequency Ordered Stop   01/17/21 1000  remdesivir 100 mg in sodium chloride 0.9 % 100 mL IVPB       "Followed by" Linked Group Details   100 mg 200 mL/hr over 30 Minutes Intravenous Daily 01/16/21 1614 01/21/21 0959   01/16/21 1700  remdesivir 200 mg in sodium chloride 0.9% 250 mL IVPB       "Followed by" Linked Group Details   200 mg 580 mL/hr over 30 Minutes Intravenous Once 01/16/21 1614 01/16/21 1750     Subjective: Eager to try to discharge home if possible No complaints Says  she's seen lots of docs, laughs in frustration about this  Objective: Vitals:   01/19/21 1210 01/19/21 1220 01/19/21 1421 01/19/21 1518  BP: 118/66 108/73 100/61 (!) 104/39  Pulse: (!) 122 (!) 119 96 (!) 170  Resp: _0 Temp: 98.3 F (36.8 C) 98.5 F (36.9 C) 98 F (36.7 C) 98.5 F (36.9 C)  TempSrc: Oral Oral Oral Oral  SpO2: 90% 98% 93% 98%  Weight:      Height:        Intake/Output Summary (Last 24 hours) at 01/19/2021 1534 Last data filed at 01/19/2021 0600 Gross per 24 hour   Intake 360 ml  Output --  Net 360 ml   Filed Weights   01/16/21 1312  Weight: 80.7 kg    Examination:  General exam: Appears calm and comfortable  Respiratory system: Clear to auscultation. Respiratory effort normal. Cardiovascular system: S1 & S2 heard, RRR (seen prior to development of afib with RVR) Gastrointestinal system: Abdomen is nondistended, soft and nontender Central nervous system: Alert and oriented. No focal neurological deficits. Extremities: no LEE Skin: No rashes, lesions or ulcers Psychiatry: Judgement and insight appear normal. Mood & affect appropriate.     Data Reviewed: I have personally reviewed following labs and imaging studies  CBC: Recent Labs  Lab 01/16/21 1318 01/16/21 1622 01/17/21 0319 01/18/21 0401 01/19/21 0410  WBC 3.8* 2.9* 2.6* 7.5 12.5*  NEUTROABS  --  1.4* 1.8 6.0 10.4*  HGB 15.7* 14.4 15.0 14.2 15.1*  HCT 48.2* 42.9 46.2* 43.6 46.5*  MCV 100.2* 98.2 100.2* 100.2* 99.8  PLT 107* 96* 108* 138* 177    Basic Metabolic Panel: Recent Labs  Lab 01/16/21 1356 01/17/21 0319 01/18/21 0401 01/19/21 0410  NA 138 137 135 139  K 4.0 4.0 4.1 4.3  CL 101 102 102 105  CO2 _1 GLUCOSE 130* 230* 306* 258*  BUN 14 16 24* 21  CREATININE 0.67 0.74 0.80 0.69  CALCIUM 8.2* 7.9* 7.9* 8.4*  MG  --  2.2  --   --     GFR: Estimated Creatinine Clearance: 62.1 mL/min (by C-G formula based on SCr of 0.69 mg/dL).  Liver Function Tests: Recent Labs  Lab 01/16/21 1356 01/17/21 0319 01/18/21 0401 01/19/21 0410  AST 82* 74* 57* 54*  ALT 37 34 31 32  ALKPHOS 54 49 50 50  BILITOT 0.9 0.7 0.8 0.8  PROT 7.4 6.5 6.2* 6.4*  ALBUMIN 3.1* 2.7* 2.6* 2.6*    CBG: Recent Labs  Lab 01/18/21 1149 01/18/21 1718 01/18/21 2103 01/19/21 0744 01/19/21 1154  GLUCAP 328* 253* 254* 348* 322*     Recent Results (from the past 240 hour(s))  SARS Coronavirus 2 by RT PCR (hospital order, performed in [place] hospital lab)  Nasopharyngeal Nasopharyngeal Swab     Status: Abnormal   Collection Time: 01/16/21  1:56 PM   Specimen: Nasopharyngeal Swab  Result Value Ref Range Status   SARS Coronavirus 2 POSITIVE (Helio Lack) NEGATIVE Final    Comment: RESULT CALLED TO, READ BACK BY AND VERIFIED WITH: RIVERS,TJ AT 1552 ON 01/16/2021 BY JPM (NOTE) SARS-CoV-2 target nucleic acids are DETECTED  SARS-CoV-2 RNA is generally detectable in upper respiratory specimens  during the acute phase of infection.  Positive results are indicative  of the presence of the identified virus, but do not rule out bacterial infection or co-infection with other pathogens not detected by the test.  Clinical correlation with patient history and  other diagnostic  information is necessary to determine patient infection status.  The expected result is negative.  Fact Sheet for Patients:   StrictlyIdeas.no   Fact Sheet for Healthcare Providers:   BankingDealers.co.za    This test is not yet approved or cleared by the [place] FDA and  has been authorized for detection and/or diagnosis of SARS-CoV-2 by FDA under an Emergency Use Authorization (EUA).  This EUA will remain in effect (meaning this  test can be used) for the duration of  the COVID-19 declaration under Section 564(b)(1) of the Act, 21 U.S.C. section 360-bbb-3(b)(1), unless the authorization is terminated or revoked sooner.  Performed at [facility], [place] [address] [place], [place] [address]   Blood Culture (routine x 2)     Status: None (Preliminary result)   Collection Time: 01/16/21  2:01 PM   Specimen: BLOOD  Result Value Ref Range Status   Specimen Description   Final    BLOOD RIGHT ANTECUBITAL Performed at [facility] [address], [place], [place] [address]    Special Requests   Final    BOTTLES DRAWN AEROBIC AND ANAEROBIC Blood Culture adequate volume Performed at [facility] [address] [place], [place] [address]    Culture   Final    NO GROWTH 3 DAYS Performed at [facility], [place] [address], [place], [place] [address]    Report Status PENDING  Incomplete  Blood Culture (routine x 2)     Status: None (Preliminary result)   Collection Time: 01/16/21  2:30 PM   Specimen: BLOOD  Result Value Ref Range Status   Specimen Description   Final    BLOOD LEFT ANTECUBITAL BOTTLES DRAWN AEROBIC AND ANAEROBIC Blood Culture adequate volume Performed at [facility] [address] [place], [place] [address]    Special Requests   Final    BOTTLES DRAWN AEROBIC AND ANAEROBIC Blood Culture adequate volume   Culture   Final    NO GROWTH 3 DAYS Performed at [facility], [place] [address], [place], [place] [address]    Report Status PENDING  Incomplete         Radiology Studies: No results found.      Scheduled Meds: . apixaban  5 mg Oral BID  . vitamin C  500 mg Oral Daily  . diltiazem  10 mg Intravenous Once  . insulin aspart  0-15 Units Subcutaneous TID WC  . insulin glargine  10 Units Subcutaneous Daily  . Ipratropium-Albuterol  1 puff Inhalation BID  . levothyroxine  75 mcg Oral QAC breakfast  . methylPREDNISolone (SOLU-MEDROL) injection  40 mg Intravenous Q12H  . metoprolol tartrate  25 mg Oral Q8H  . pravastatin  40 mg Oral q1800  . umeclidinium bromide  1 puff Inhalation Daily  . zinc sulfate  220 mg Oral Daily   Continuous Infusions: . diltiazem (CARDIZEM) infusion    . remdesivir 100 mg in NS 100 mL 100 mg (01/19/21 1001)     LOS: 3 days    Time spent: over 30 min 40 min critical care time with new onset afib with RVR    Fayrene Helper, MD [facility]   To contact the attending provider between 7A-7P or the covering provider during after hours 7P-7A, please log into the web site [email or web address] and access using universal [place] password for that web site. If you do  not have the password, please call the hospital operator.  01/19/2021, 3:34 PM

## 2021-01-19 NOTE — Care Management Important Message (Signed)
Important Message  Patient Details IM Letter placed in Patient's door Caddy. Name: Elizabeth Melton MRN: [id number] Date of Birth: 09/23/1941   Medicare Important Message Given:  Yes     Kerin Salen 01/19/2021, 1:39 PM

## 2021-01-19 NOTE — Progress Notes (Signed)
Manual blood pressure checked. Blood pressure obtained 90/45. Patient alert and oriented.

## 2021-01-19 NOTE — Progress Notes (Signed)
01/19/2021  Patient vital signs did not cross over in the computer, but in the dinamap the MEWS score was Green. MD notified of the vital signs, EKG completed, and ordered medicine was given. Put vitals in computer then it flag the MEWS as RED. Vitals signs repeated MEWS Yellow. Started patient on Yellow MEWS protocol.

## 2021-01-19 NOTE — Progress Notes (Addendum)
01/19/21 1618  Vitals  BP 99/68  MAP (mmHg) 77  BP Location Left Arm  BP Method Automatic  Pulse Rate (!) 165  Level of Consciousness  Level of Consciousness Alert  MEWS COLOR  MEWS Score Color Red  Oxygen Therapy  SpO2 98 %  O2 Device Room Air  Pulse Oximetry Type Intermittent  Cardiziem gtt initiated per orders. Patient alert and oriented. Denying any pain. " Endorsing, feeling fine." Will continue to monitor patient MAR/protocol.

## 2021-01-19 NOTE — Progress Notes (Signed)
   01/19/21 1645  Vitals  BP (!) 68/38  MAP (mmHg) (!) 45  BP Location Right Arm  BP Method Automatic  Patient Position (if appropriate) Lying  Pulse Rate (!) 160  Pulse Rate Source Monitor  Level of Consciousness  Level of Consciousness Alert  Oxygen Therapy  SpO2 98 %  O2 Device Room Air  Pulse Oximetry Type Intermittent  Patient alert and oriented x 4. RR even and unlabored. Denying chest pain Endorsing, " I just don't feel well."  Cardieziem gtt stopped. Charge RN called. Rapid response activated by charge RN. Made MD Florene Glen aware. Per MD Florene Glen. "Start LR bolus."

## 2021-01-19 NOTE — Progress Notes (Signed)
CRITICAL VALUE ALERT  Critical Value:  Troponin 400  Date & Time Notied:  2/9 1930  Provider Notified: E-link notified  Orders Received/Actions taken: awaiting orders, will continue to monitor

## 2021-01-20 ENCOUNTER — Ambulatory Visit: Payer: Medicare HMO | Admitting: Pulmonary Disease

## 2021-01-20 DIAGNOSIS — I248 Other forms of acute ischemic heart disease: Secondary | ICD-10-CM

## 2021-01-20 DIAGNOSIS — U071 COVID-19: Secondary | ICD-10-CM | POA: Diagnosis not present

## 2021-01-20 DIAGNOSIS — J1282 Pneumonia due to coronavirus disease 2019: Secondary | ICD-10-CM | POA: Diagnosis not present

## 2021-01-20 DIAGNOSIS — I4891 Unspecified atrial fibrillation: Secondary | ICD-10-CM

## 2021-01-20 LAB — COMPREHENSIVE METABOLIC PANEL
ALT: 48 U/L — ABNORMAL HIGH (ref 0–44)
AST: 91 U/L — ABNORMAL HIGH (ref 15–41)
Albumin: 2.7 g/dL — ABNORMAL LOW (ref 3.5–5.0)
Alkaline Phosphatase: 53 U/L (ref 38–126)
Anion gap: 11 (ref 5–15)
BUN: 32 mg/dL — ABNORMAL HIGH (ref 8–23)
CO2: 22 mmol/L (ref 22–32)
Calcium: 8.2 mg/dL — ABNORMAL LOW (ref 8.9–10.3)
Chloride: 103 mmol/L (ref 98–111)
Creatinine, Ser: 0.94 mg/dL (ref 0.44–1.00)
GFR, Estimated: >60 mL/min (ref >60)
Glucose, Bld: 273 mg/dL — ABNORMAL HIGH (ref 70–99)
Potassium: 4.7 mmol/L (ref 3.5–5.1)
Sodium: 136 mmol/L (ref 135–145)
Total Bilirubin: 0.8 mg/dL (ref 0.3–1.2)
Total Protein: 6.2 g/dL — ABNORMAL LOW (ref 6.5–8.1)

## 2021-01-20 LAB — CBC WITH DIFFERENTIAL/PLATELET
Abs Immature Granulocytes: 0.09 10*3/uL — ABNORMAL HIGH (ref 0.00–0.07)
Basophils Absolute: 0 10*3/uL (ref 0.0–0.1)
Basophils Relative: 0 %
Eosinophils Absolute: 0 10*3/uL (ref 0.0–0.5)
Eosinophils Relative: 0 %
HCT: 46.3 % — ABNORMAL HIGH (ref 36.0–46.0)
Hemoglobin: 15.4 g/dL — ABNORMAL HIGH (ref 12.0–15.0)
Immature Granulocytes: 1 %
Lymphocytes Relative: 12 %
Lymphs Abs: 1.5 10*3/uL (ref 0.7–4.0)
MCH: 32.6 pg (ref 26.0–34.0)
MCHC: 33.3 g/dL (ref 30.0–36.0)
MCV: 97.9 fL (ref 80.0–100.0)
Monocytes Absolute: 0.5 10*3/uL (ref 0.1–1.0)
Monocytes Relative: 4 %
Neutro Abs: 10.4 10*3/uL — ABNORMAL HIGH (ref 1.7–7.7)
Neutrophils Relative %: 83 %
Platelets: 161 10*3/uL (ref 150–400)
RBC: 4.73 MIL/uL (ref 3.87–5.11)
RDW: 14.4 % (ref 11.5–15.5)
WBC: 12.4 10*3/uL — ABNORMAL HIGH (ref 4.0–10.5)
nRBC: 1.2 % — ABNORMAL HIGH (ref 0.0–0.2)

## 2021-01-20 LAB — GLUCOSE, CAPILLARY
Glucose-Capillary: 262 mg/dL — ABNORMAL HIGH (ref 70–99)
Glucose-Capillary: 273 mg/dL — ABNORMAL HIGH (ref 70–99)
Glucose-Capillary: 216 mg/dL — ABNORMAL HIGH (ref 70–99)
Glucose-Capillary: 269 mg/dL — ABNORMAL HIGH (ref 70–99)

## 2021-01-20 LAB — C-REACTIVE PROTEIN: CRP: 0.7 mg/dL (ref <1.0)

## 2021-01-20 LAB — TROPONIN I (HIGH SENSITIVITY)
Troponin I (High Sensitivity): 1442 ng/L (ref <18)
Troponin I (High Sensitivity): 1209 ng/L (ref <18)

## 2021-01-20 LAB — PHOSPHORUS: Phosphorus: 3 mg/dL (ref 2.5–4.6)

## 2021-01-20 LAB — D-DIMER, QUANTITATIVE: D-Dimer, Quant: 1.55 ug/mL-FEU — ABNORMAL HIGH (ref 0.00–0.50)

## 2021-01-20 LAB — MAGNESIUM: Magnesium: 2.3 mg/dL (ref 1.7–2.4)

## 2021-01-20 MED ORDER — METOPROLOL TARTRATE 25 MG PO TABS
25.0000 mg | ORAL_TABLET | Freq: Two times a day (BID) | ORAL | Status: DC
  Administered 2021-01-20 – 2021-01-21 (×3): 25 mg via ORAL
  Filled 2021-01-20 (×3): qty 1

## 2021-01-20 NOTE — Consult Note (Addendum)
Cardiology Consultation:   Patient ID: BRITTAN BUTTERBAUGH; [id number]; May 08, 1941   Admit date: 01/16/2021 Date of Consult: 01/20/2021  Primary Care Provider: Kathyrn Lass, MD Primary Cardiologist: Ena Dawley, MD assigned at office visit with Maryla Morrow, NP in 2019, never saw Primary Electrophysiologist:  None   Patient Profile:   Elizabeth Melton is a 80 y.o. 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  History of Present Illness:   Elizabeth Melton was seen in the office 2019 for mod AI seen on echo and DOE. Lexi MV ok.  Admitted 08/2020 for CP and SOB, echo at that time w/ PASP 70 and RV hypokinetic, she was +bilateral PE.  11/2020 office visit for Elizabeth. [name] for ILD, she has stable disease but was to continue the Eliquis. 12/2020 echo showed improvement in RV function, PAS still severely elevated 01/13/2021 phone notes regarding decreased energy, left side of her face feels swollen, black stools.  Elizabeth. [name] recommended stopping the Eliquis  Admitted 01/16/2021 with shortness of breath. +Covid PNA, CT negative for PE.  She was in atrial fibrillation, RVR.  Diltiazem caused her SBP to drop into the 60s, she has been started on amiodarone, heart rate 100s-130s.  BP has improved.  Troponin elevated greater than 700. She has been restarted on the Eliquis, cardiology asked to see.    Past Medical History:  Diagnosis Date  . Diabetes mellitus without complication ([place])   . Hyperlipidemia   . Hypertension   . Hypothyroidism   . Thrombocytopenia ([place])     Past Surgical History:  Procedure Laterality Date  . TUBAL LIGATION       Prior to Admission medications   Medication Sig Start Date  End Date Taking? Authorizing Provider  acetaminophen (TYLENOL) 500 MG tablet Take 500 mg by mouth daily as needed for headache (pain).    Yes [provider]  apixaban (ELIQUIS) 5 MG TABS tablet Take 1 tablet (5 mg total) by mouth 2 (two) times daily. 10/11/20  Yes Mercy Riding, MD  b complex vitamins tablet Take 1 tablet by mouth daily.   Yes [provider]  Blood Glucose Monitoring Suppl (TRUE METRIX METER) w/Device KIT 1 each by Other route in the morning and at bedtime. 12/25/20  Yes [provider]  Calcium Carb-Cholecalciferol (CALCIUM + D3) 600-200 MG-UNIT TABS Take 2 tablets by mouth daily.    Yes [provider]  cholecalciferol (VITAMIN D3) 25 MCG (1000 UT) tablet Take 1,000 Units by mouth daily.   Yes [provider]  glucose blood test strip 1 each by Other route as needed for other (blood sugar).   Yes [provider]  levothyroxine (SYNTHROID, LEVOTHROID) 75 MCG tablet Take 75 mcg by mouth daily before breakfast.    Yes [provider]  lovastatin (MEVACOR) 40 MG tablet Take 40 mg by mouth at bedtime.   Yes [provider]  Omega-3 Fatty Acids (FISH OIL PO) Take 1 capsule by mouth daily.   Yes [provider]  SMART SENSE THIN LANCETS 26G MISC 1 each by Does not apply route 2 (two) times daily.   Yes [provider]  Tiotropium Bromide-Olodaterol (STIOLTO RESPIMAT) 2.5-2.5 MCG/ACT  AERS Inhale 2 puffs into the lungs daily. 10/19/20  Yes Brand Males, MD    Inpatient Medications: Scheduled Meds: . apixaban  5 mg Oral BID  . vitamin C  500 mg Oral Daily  . aspirin  81 mg Oral Daily  . Chlorhexidine Gluconate Cloth  6 each Topical Daily  . insulin aspart  0-15 Units Subcutaneous TID WC  . insulin glargine  15 Units Subcutaneous Daily  . Ipratropium-Albuterol  1 puff Inhalation BID  . levothyroxine  75 mcg Oral QAC breakfast  . mouth rinse  15 mL Mouth Rinse BID  . methylPREDNISolone  (SOLU-MEDROL) injection  40 mg Intravenous Q12H  . pravastatin  40 mg Oral q1800  . umeclidinium bromide  1 puff Inhalation Daily  . zinc sulfate  220 mg Oral Daily   Continuous Infusions: . amiodarone 30 mg/hr (01/20/21 0540)  . remdesivir 100 mg in NS 100 mL Stopped (01/19/21 1032)   PRN Meds: acetaminophen **OR** acetaminophen, chlorpheniramine-HYDROcodone, guaiFENesin-dextromethorphan, nitroGLYCERIN, ondansetron **OR** ondansetron (ZOFRAN) IV, oxyCODONE  Allergies:   No Known Allergies  Social History:   Social History   Socioeconomic History  . Marital status: Married    Spouse name: Not on file  . Number of children: Not on file  . Years of education: Not on file  . Highest education level: Not on file  Occupational History  . Not on file  Tobacco Use  . Smoking status: Current Every Day Smoker    Packs/day: 0.75    Years: 60.00    Pack years: 45.00    Types: Cigarettes  . Smokeless tobacco: Former Systems developer    Quit date: 09/24/1962  . Tobacco comment: 1 pack lasts 4 days  Vaping Use  . Vaping Use: Never used  Substance and Sexual Activity  . Alcohol use: Yes  . Drug use: No  . Sexual activity: Not on file  Other Topics Concern  . Not on file  Social History Narrative  . Not on file   Social Determinants of Health   Financial Resource Strain: Not on file  Food Insecurity: Not on file  Transportation Needs: Not on file  Physical Activity: Not on file  Stress: Not on file  Social Connections: Not on file  Intimate Partner Violence: Not on file    Family History:   Family History  Problem Relation Age of Onset  . Diabetes Mother   . Kidney disease Mother   . Hypertension Mother   . Other Father        tuberculosis  . Hypertension Sister   . Hyperlipidemia Sister    Family Status:  Family Status  Relation Name Status  . Mother  Deceased  . Father  Deceased  . MGM  Deceased  . MGF  Deceased  . PGM  Deceased  . PGF  Deceased  . Sister  Alive  .  Brother  Alive  . Sister  Deceased  . Brother  Deceased  . Brother  Deceased    ROS:  Please see the history of present illness.  All other ROS reviewed and negative.     Physical Exam/Data:   Vitals:   01/20/21 0334 01/20/21 0400 01/20/21 0430 01/20/21 0500  BP:    (!) 120/59  Pulse:  (!) 126 (!) 121   Resp:  16 16 (!) 21  Temp: (!) 97.4 F (36.3 C)     TempSrc: Oral     SpO2:  95% 95%   Weight:  Height:        Intake/Output Summary (Last 24 hours) at 01/20/2021 0730 Last data filed at 01/20/2021 0313 Gross per 24 hour  Intake 745.84 ml  Output -  Net 745.84 ml    Last 3 Weights 01/16/2021 11/22/2020 09/29/2020  Weight (lbs) 178 lb 177 lb 6.4 oz 171 lb 9.6 oz  Weight (kg) 80.74 kg 80.468 kg 77.837 kg     Body mass index is 28.73 kg/m.   General:   in no acute distress Neck: + JVD Cardiac:  normal S1, S2; Irreg R&R, tachycardic, no murmur Lungs:  Diminished breath sounds Abd: soft, nontender, Ext: no edema Musculoskeletal:  No deformities, BUE and BLE strength normal and equal Skin: warm and dry  Neuro:  no focal abnormalities noted Psych:  Normal affect   EKG:  The EKG was personally reviewed and demonstrates:  2/9 initial ECG is atrial fibrillation, heart rate 165, pronounced lateral ST depression  2/9 PM ECG is atrial fibrillation, RVR, heart rate 127, lateral ST segments improved Telemetry:  Telemetry was personally reviewed and demonstrates: Atrial fibrillation, RVR, rates 110-130s   CV studies:   ECHO: 01/07/2021 1. Left ventricular ejection fraction, by estimation, is 60 to 65%. The  left ventricle has normal function. The left ventricle has no regional  wall motion abnormalities. There is mild left ventricular hypertrophy.  Left ventricular diastolic parameters  are consistent with Grade II diastolic dysfunction (pseudonormalization).  Elevated left ventricular end-diastolic pressure. The E/e' is 24.  2. Right ventricular systolic function  is normal. The right ventricular  size is normal. There is severely elevated pulmonary artery systolic  pressure. The estimated right ventricular systolic pressure is 73.5 mmHg.  3. Left atrial size was severely dilated.  4. Right atrial size was mildly dilated.  5. The mitral valve is grossly normal. Trivial mitral valve  regurgitation.  6. The aortic valve is tricuspid. Aortic valve regurgitation is mild.  Mild aortic valve stenosis. Aortic valve area, by VTI measures 1.71 cm.  Aortic valve mean gradient measures 8.0 mmHg. Aortic valve Vmax measures  2.17 m/s.  7. The inferior vena cava is normal in size with greater than 50%  respiratory variability, suggesting right atrial pressure of 3 mmHg.   Comparison(s): Changes from prior study are noted. 09/06/20: LVEF 60-65%,  RVSP 70 mmHG, severely dilated and hypokinetic RV. In my review of the  previous study, the RV does not appear significantly dilated or  hypokinetic.    MYOVIEW: 08/28/2018  Nuclear stress EF: 62%.  There was no ST segment deviation noted during stress.  The left ventricular ejection fraction is normal (55-65%).  The study is normal.   1. EF 62%, normal wall motion.  2. No evidence for ischemia or infarction on perfusion images.   Normal study.     Laboratory Data:   Chemistry Recent Labs  Lab 01/19/21 0410 01/19/21 2004 01/20/21 0239  NA 139 141 136  K 4.3 4.5 4.7  CL 105 109 103  CO2 _0 GLUCOSE 258* 113* 273*  BUN 21 32* 32*  CREATININE 0.69 1.12* 0.94  CALCIUM 8.4* 8.4* 8.2*  GFRNONAA >60 50* >60  ANIONGAP _1 Lab Results  Component Value Date   ALT 48 (H) 01/20/2021   AST 91 (H) 01/20/2021   ALKPHOS 53 01/20/2021   BILITOT 0.8 01/20/2021   Hematology Recent Labs  Lab 01/18/21 0401 01/19/21 0410 01/20/21 0239  WBC 7.5 12.5* 12.4*  RBC 4.35  4.66 4.73  HGB 14.2 15.1* 15.4*  HCT 43.6 46.5* 46.3*  MCV 100.2* 99.8 97.9  MCH 32.6 32.4 32.6  MCHC 32.6 32.5  33.3  RDW 14.1 14.2 14.4  PLT 138* 161 161   Cardiac Enzymes High Sensitivity Troponin:   Recent Labs  Lab 01/16/21 1408 01/16/21 1608 01/19/21 1816 01/19/21 2004  TROPONINIHS 13 13 400* 746*      BNP Recent Labs  Lab 01/16/21 1408  BNP 115.0*    DDimer  Recent Labs  Lab 01/18/21 0401 01/19/21 0410 01/20/21 0239  DDIMER 1.89* 1.63* 1.55*   TSH:  Lab Results  Component Value Date   TSH 2.081 01/16/2021   Lipids: Lab Results  Component Value Date   TRIG 95 01/16/2021   HgbA1c: Lab Results  Component Value Date   HGBA1C 7.6 (H) 01/16/2021   Magnesium:  Magnesium  Date Value Ref Range Status  01/20/2021 2.3 1.7 - 2.4 mg/dL Final    Comment:    Performed at [facility], [place] [address], [place], [place] [address]     Radiology/Studies:  DG Chest 2 View  Result Date: 01/16/2021 CLINICAL DATA:  Shortness of breath. EXAM: CHEST - 2 VIEW COMPARISON:  September 07, 2020. FINDINGS: Stable cardiomegaly. No pneumothorax or pleural effusion is noted. Stable diffuse interstitial densities are noted which may represent chronic interstitial lung disease, but acute superimposed edema or inflammation cannot be excluded. Bony thorax is unremarkable. IMPRESSION: Stable diffuse interstitial densities are noted which may represent chronic interstitial lung disease, but acute superimposed edema or inflammation cannot be excluded. Aortic Atherosclerosis (ICD10-I70.0). Electronically Signed   By: Marijo Conception M.D.   On: 01/16/2021 13:47   CT Angio Chest PE W and/or Wo Contrast  Result Date: 01/16/2021 CLINICAL DATA:  COVID.  History of ILD EXAM: CT ANGIOGRAPHY CHEST WITH CONTRAST TECHNIQUE: Multidetector CT imaging of the chest was performed using the standard protocol during bolus administration of intravenous contrast. Multiplanar CT image reconstructions and MIPs were obtained to evaluate the vascular anatomy. CONTRAST:  177m OMNIPAQUE IOHEXOL 350 MG/ML SOLN  COMPARISON:  September 05, 2020, October 20, 2019 FINDINGS: Cardiovascular: Limited evaluation of the bases secondary to respiratory motion. Satisfactory opacification of the pulmonary arteries to the segmental level. No evidence of pulmonary embolism. Heart is enlarged. No pericardial effusion. Calcifications of the aortic valve. Atherosclerotic calcifications of the aorta. Predominately LEFT-sided coronary artery atherosclerotic calcifications. Mediastinum/Nodes: Thyroid is unremarkable. Calcified subcarinal lymph node consistent with sequela of prior granulomatous infection. Revisualization of mediastinal and hilar adenopathy. Lymph node borders are difficult to ascertain. Pretracheal lymph node measures 15 mm in the short axis, previously 9 mm in 2020 (series 5, image 57). RIGHT infrahilar lymph node measures 12 mm in the short axis (series 5, image 116). Preaortic lymph node measures 8 mm in the short axis, previously 5 mm in 2020 (series 4, image 41). Lungs/Pleura: No pneumothorax. Subpleural reticulation, traction bronchiectasis and honeycombing are similar in comparison to prior study from 2020. However there is increased superimposed ill-defined ground-glass opacities and interlobular septal thickening in comparison to prior. Ground-glass opacities are in a predominantly peripheral distribution. Increased bronchial wall thickening in comparison to prior. Debris within the trachea. More focal nodular opacity of the RIGHT upper lobe along the fissure measures 7 by 7 mm, unchanged in comparison to prior (series 6, image 65). Upper Abdomen: Small hiatal hernia.  No acute findings. Musculoskeletal: Degenerative changes of the thoracic spine. Review of the MIP images confirms the above  findings. IMPRESSION: 1. No evidence of acute pulmonary embolism. 2. Increased superimposed ill-defined ground-glass opacities and interlobular septal thickening in comparison to prior study from 2020. Findings are consistent with  superimposed COVID 19 infection on a background of interstitial lung disease. 3. Mediastinal and hilar adenopathy, likely reactive. Aortic Atherosclerosis (ICD10-I70.0). 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  Had been on Eliquis given PE history but was recently discontinued.  Initially with hypotension with rates in 160s.  Started on amiodarone drip with improvement in rate and BP.  Suspect being driven by acute illness with COVID-19 pneumonia -Agree with restarting Eliquis 5 mg twice daily -Continue IV amiodarone -Became hypotensive with diltiazem.  BP improved, will add low-dose metoprolol for better rate control  Troponin elevation: troponin up to 1442 this morning.  She denies any chest pain.  Suspect demand ischemia in setting of AF with RVR and hypotension yesterday.  Echocardiogram ordered.   For questions or updates, please contact Amber Please consult [email or web address] for contact info under Cardiology/STEMI.   SignedRosaria Ferries, PA-C  01/20/2021 7:30 AM

## 2021-01-20 NOTE — Progress Notes (Addendum)
PROGRESS NOTE    Elizabeth Melton  LSL:[id number] DOB: July 26, 1941 DOA: 01/16/2021 PCP: Kathyrn Lass, MD  Chief Complaint  Patient presents with  . Shortness of Breath  . Dizziness    Brief Narrative: Elizabeth L Brunsonis Elizabeth Melton 80 y.o.femalewith medical history significant ofILD(ANA positive)who was previously on nocturnal oxygen but has been off for some time, history of saddle PE recently stopped Eliquis, essential hypertension, hypothyroidism, hyperlipidemia, type 2 diabetes mellitus who presented to the ED with progressive shortness of breath and intermittent dizziness over the past week. Patient initially thought it was related to her Eliquis, she contacted her pulmonologist who elected to have patient stop her anticoagulant. Patient symptoms continue to progress now associated with decreased appetite, myalgias.Patient denies any sick contacts, no recent travel and no other changes in medication regimen. Patient specifically denies visual changes, no chest pain, no palpitations, no nausea/vomiting/diarrhea, no abdominal pain, no chills/night sweats, no paresthesias.  She was admitted for acute hypoxic respiratory failure 2/2 COVID 19 pneumonia.   Assessment & Plan:   Principal Problem:   Pneumonia due to COVID-19 virus Active Problems:   Hypothyroidism   Hyperlipidemia   Diabetes mellitus without complication ([place])   Essential (primary) hypertension   ILD (interstitial lung disease) ([place])   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HCC)   Acute respiratory failure with hypoxia (HCC)  Atrial Fibrillation with RVR Inadequate response to metop IV/PO yesterday, developed hypotension with diltiazem to 11'X systolic Now on amiodarone, BP improved, but still with RVR with rates in the 100's-110's (up to 130's when I was in the room) PCCM c/s 2/9 in case of need for cardioversion with hypotension  BP improved TSH wnl 2/6.  Echo with grade II diastolic dysfunction.  Severely elevated  PASP.   chadvasc at least 6, she's already on eliquis, continue Cardiology consult, appreciate assistance  Elevated Troponin   NSTEMI vs Demand Ischemia in setting of afib with RVR No CP or SOB.  She did c/o intermittent L flank pain yesterday, resolved today. Initial EKG yesterday with T wave inversion II, III, aVF, ST depression V3-V6, thought to be rate related changes.  Repeat EKG appeared improved.   Troponin rising to 746, repeat this AM pending Aspirin started by e link last night.  On anticoagulation with eliquis. Will repeat limited echo for wall motion abnormality Cardiology c/s, appreciate recs A1c 7.6, lipid panel   Acute hypoxic respiratory failure secondary to acute Covid-19 viral pneumonia during the ongoing Covid 19 Pandemic - POA Progressive shortness of breath with associated myalgias and decreased appetite over the last week. Patient initially thought this was related to her home Eliquis, which she stopped taking on 01/13/2021 after discussion with her pulmonologist. Patient is unvaccinated against Covid-19.  -- Currently on 2 L (wean as tolerated) -- CT PE protocol 2/6 without evidence of acute PE, superimposed covid 19 infection on [name] background of ILD, mediastinal and hilar adenopathy (likely reactive) --COVID test:+ 01/16/2021 --Remdesivir, plan 5-day course (Day#5/5) --Continue Solumedrolto 83m IV q12h -- I/O, daily weights -- prone as able, OOB, IS, flutter  COVID-19 Labs  Recent Labs    01/18/21 0401 01/19/21 0410 01/20/21 0239  DDIMER 1.89* 1.63* 1.55*  CRP 1.8* 0.7 0.7    Lab Results  Component Value Date   SARSCOV2NAA POSITIVE ([name]) 01/16/2021   SGlenvilNEGATIVE 09/05/2020   SRiver OaksNEGATIVE 03/13/2020   SHooperNOT DETECTED 10/20/2019   Recent saddle pulmonary embolism Patient with recent large volume thrombus including [name] thin saddle PE with  associated right heart strain on CTA 09/05/2020. Patient was started on IV heparin at that  time and PCCM and IR consulted for TPA consideration but patient declined. Patient was on IV heparin for 48 hours and transition to Eliquis. Patient recently stopped her Eliquis after contacting her pulmonology office as thought her current shortness of breath related to Eliquis use. CT angiogram chest negative for PE. --Continue home Eliquis  Elevated LFTs: possibly related to covid vs hypotension yesterday.  Continue to follow  Essential hypertension Currently off antihypertensives.  Hypotensive yesterday on AV nodal blockers - follow off antihypertensives  Hyperlipidemia On lovastatin 40 mg p.o. daily at home, will continue with pravastatin hospital substitution while inpatient  Interstitial lung disease History of pulmonary nodules Patient follows with pulmonology, Dr. [name] outpatient. History of ANA positive. Was previously on nocturnal oxygen, but has been off for some time. CT high-resolution chest 08/28/2018 with scattered solid pulmonary nodules, largest 7 mm basilar right upper lobe which have been stable. --Continue Combivent inhaler as above --Continue supplemental oxygen, titrate to maintain SPO2 greater than 88% --Outpatient follow-up with pulmonology on discharge  DVT prophylaxis: eliquis Code Status: full Family Communication: none at bedside - daughter 2/9 over phone.  No answer 2/10, left message. Disposition:   Status is: Inpatient  Remains inpatient appropriate because:Inpatient level of care appropriate due to severity of illness   Dispo: The patient is from: Home              Anticipated d/c is to: Home              Anticipated d/c date is: > 3 days              Patient currently is not medically stable to d/c.   Difficult to place patient No  Consultants:   cardiology  Procedures:  Echo IMPRESSIONS    1. Left ventricular ejection fraction, by estimation, is 60 to 65%. The  left ventricle has normal function. The left ventricle has no  regional  wall motion abnormalities. There is mild left ventricular hypertrophy.  Left ventricular diastolic parameters  are consistent with Grade II diastolic dysfunction (pseudonormalization).  Elevated left ventricular end-diastolic pressure. The E/e' is 24.  2. Right ventricular systolic function is normal. The right ventricular  size is normal. There is severely elevated pulmonary artery systolic  pressure. The estimated right ventricular systolic pressure is 11.0 mmHg.  3. Left atrial size was severely dilated.  4. Right atrial size was mildly dilated.  5. The mitral valve is grossly normal. Trivial mitral valve  regurgitation.  6. The aortic valve is tricuspid. Aortic valve regurgitation is mild.  Mild aortic valve stenosis. Aortic valve area, by VTI measures 1.71 cm.  Aortic valve mean gradient measures 8.0 mmHg. Aortic valve Vmax measures  2.17 m/s.  7. The inferior vena cava is normal in size with greater than 50%  respiratory variability, suggesting right atrial pressure of 3 mmHg.   Comparison(s): Changes from prior study are noted. 09/06/20: LVEF 60-65%,  RVSP 70 mmHG, severely dilated and hypokinetic RV. In my review of the  previous study, the RV does not appear significantly dilated or  hypokinetic.   Antimicrobials: Anti-infectives (From admission, onward)   Start     Dose/Rate Route Frequency Ordered Stop   01/17/21 1000  remdesivir 100 mg in sodium chloride 0.9 % 100 mL IVPB       "Followed by" Linked Group Details   100 mg 200 mL/hr over 30 Minutes  Intravenous Daily 01/16/21 1614 01/21/21 0959   01/16/21 1700  remdesivir 200 mg in sodium chloride 0.9% 250 mL IVPB       "Followed by" Linked Group Details   200 mg 580 mL/hr over 30 Minutes Intravenous Once 01/16/21 1614 01/16/21 1750     Subjective: No CP or SOB Intermittent L flank pain, like she needed Sebasthian Stailey massage - none at this time  Objective: Vitals:   01/20/21 0334 01/20/21 0400 01/20/21 0430  01/20/21 0500  BP:    (!) 120/59  Pulse:  (!) 126 (!) 121   Resp:  16 16 (!) 21  Temp: (!) 97.4 F (36.3 C)     TempSrc: Oral     SpO2:  95% 95%   Weight:      Height:        Intake/Output Summary (Last 24 hours) at 01/20/2021 0749 Last data filed at 01/20/2021 0313 Gross per 24 hour  Intake 745.84 ml  Output -  Net 745.84 ml   Filed Weights   01/16/21 1312  Weight: 80.7 kg    Examination:  General: No acute distress. Cardiovascular: irregularly irregular, tachy Lungs: bilateral rhonchi, no increased wob Abdomen: Soft, nontender, nondistended Neurological: Alert and oriented 3. Moves all extremities 4 . Cranial nerves II through XII grossly intact. Skin: Warm and dry. No rashes or lesions. Extremities: No clubbing or cyanosis. No edema.   Data Reviewed: I have personally reviewed following labs and imaging studies  CBC: Recent Labs  Lab 01/16/21 1622 01/17/21 0319 01/18/21 0401 01/19/21 0410 01/20/21 0239  WBC 2.9* 2.6* 7.5 12.5* 12.4*  NEUTROABS 1.4* 1.8 6.0 10.4* 10.4*  HGB 14.4 15.0 14.2 15.1* 15.4*  HCT 42.9 46.2* 43.6 46.5* 46.3*  MCV 98.2 100.2* 100.2* 99.8 97.9  PLT 96* 108* 138* 161 563    Basic Metabolic Panel: Recent Labs  Lab 01/17/21 0319 01/18/21 0401 01/19/21 0410 01/19/21 2004 01/20/21 0239  NA 137 135 139 141 136  K 4.0 4.1 4.3 4.5 4.7  CL 102 102 105 109 103  CO2 _0 GLUCOSE 230* 306* 258* 113* 273*  BUN 16 24* 21 32* 32*  CREATININE 0.74 0.80 0.69 1.12* 0.94  CALCIUM 7.9* 7.9* 8.4* 8.4* 8.2*  MG 2.2  --   --  2.4 2.3  PHOS  --   --   --   --  3.0    GFR: Estimated Creatinine Clearance: 52.9 mL/min (by C-G formula based on SCr of 0.94 mg/dL).  Liver Function Tests: Recent Labs  Lab 01/16/21 1356 01/17/21 0319 01/18/21 0401 01/19/21 0410 01/20/21 0239  AST 82* 74* 57* 54* 91*  ALT 37 34 31 32 48*  ALKPHOS 54 49 50 50 53  BILITOT 0.9 0.7 0.8 0.8 0.8  PROT 7.4 6.5 6.2* 6.4* 6.2*  ALBUMIN 3.1* 2.7* 2.6*  2.6* 2.7*    CBG: Recent Labs  Lab 01/19/21 0744 01/19/21 1154 01/19/21 1624 01/19/21 1705 01/19/21 2150  GLUCAP 348* 322* 107* 90 155*     Recent Results (from the past 240 hour(s))  SARS Coronavirus 2 by RT PCR (hospital order, performed in [facility] hospital lab) Nasopharyngeal Nasopharyngeal Swab     Status: Abnormal   Collection Time: 01/16/21  1:56 PM   Specimen: Nasopharyngeal Swab  Result Value Ref Range Status   SARS Coronavirus 2 POSITIVE (Naoki Migliaccio) NEGATIVE Final    Comment: RESULT CALLED TO, READ BACK BY AND VERIFIED WITH: RIVERS,TJ AT 1552 ON 01/16/2021 BY JPM (NOTE)  SARS-CoV-2 target nucleic acids are DETECTED  SARS-CoV-2 RNA is generally detectable in upper respiratory specimens  during the acute phase of infection.  Positive results are indicative  of the presence of the identified virus, but do not rule out bacterial infection or co-infection with other pathogens not detected by the test.  Clinical correlation with patient history and  other diagnostic information is necessary to determine patient infection status.  The expected result is negative.  Fact Sheet for Patients:   StrictlyIdeas.no   Fact Sheet for Healthcare Providers:   BankingDealers.co.za    This test is not yet approved or cleared by the [place] FDA and  has been authorized for detection and/or diagnosis of SARS-CoV-2 by FDA under an Emergency Use Authorization (EUA).  This EUA will remain in effect (meaning this  test can be used) for the duration of  the COVID-19 declaration under Section 564(b)(1) of the Act, 21 U.S.C. section 360-bbb-3(b)(1), unless the authorization is terminated or revoked sooner.  Performed at [facility], [place] [address] [place], [place] [address]   Blood Culture (routine x 2)     Status: None (Preliminary result)   Collection Time: 01/16/21  2:01 PM   Specimen: BLOOD  Result Value Ref Range  Status   Specimen Description   Final    BLOOD RIGHT ANTECUBITAL Performed at [place] [address], [place], [place] [address]    Special Requests   Final    BOTTLES DRAWN AEROBIC AND ANAEROBIC Blood Culture adequate volume Performed at [place] [address] [place], [place] [address]    Culture   Final    NO GROWTH 4 DAYS Performed at [facility], [place] [address], [place], [place] [address]    Report Status PENDING  Incomplete  Blood Culture (routine x 2)     Status: None (Preliminary result)   Collection Time: 01/16/21  2:30 PM   Specimen: BLOOD  Result Value Ref Range Status   Specimen Description   Final    BLOOD LEFT ANTECUBITAL BOTTLES DRAWN AEROBIC AND ANAEROBIC Blood Culture adequate volume Performed at [place] [address], [place], [place] [address]    Special Requests   Final    BOTTLES DRAWN AEROBIC AND ANAEROBIC Blood Culture adequate volume   Culture   Final    NO GROWTH 4 DAYS Performed at [facility], [place] [address], [place], [place] [address]    Report Status PENDING  Incomplete  MRSA PCR Screening     Status: None   Collection Time: 01/19/21  6:58 PM   Specimen: Nasal Mucosa; Nasopharyngeal  Result Value Ref Range Status   MRSA by PCR NEGATIVE NEGATIVE Final    Comment:        The GeneXpert MRSA Assay (FDA approved for NASAL specimens only), is one component of Aiyanna Awtrey comprehensive MRSA colonization surveillance program. It is not intended to diagnose MRSA infection nor to guide or monitor treatment for MRSA infections. Performed at [facility], [place] [address], [place], [place] [address]          Radiology Studies: No results found.      Scheduled Meds: . apixaban  5 mg Oral BID  . vitamin C  500 mg Oral Daily  . aspirin  81 mg Oral Daily  . Chlorhexidine Gluconate Cloth  6 each Topical Daily  . insulin aspart  0-15 Units  Subcutaneous TID WC  .  insulin glargine  15 Units Subcutaneous Daily  . Ipratropium-Albuterol  1 puff Inhalation BID  . levothyroxine  75 mcg Oral QAC breakfast  . mouth rinse  15 mL Mouth Rinse BID  . methylPREDNISolone (SOLU-MEDROL) injection  40 mg Intravenous Q12H  . pravastatin  40 mg Oral q1800  . umeclidinium bromide  1 puff Inhalation Daily  . zinc sulfate  220 mg Oral Daily   Continuous Infusions: . amiodarone 30 mg/hr (01/20/21 0540)  . remdesivir 100 mg in NS 100 mL Stopped (01/19/21 1032)     LOS: 4 days    Time spent: over 30 min    Fayrene Helper, MD [facility]   To contact the attending provider between 7A-7P or the covering provider during after hours 7P-7A, please log into the web site [email or web address] and access using universal [place] password for that web site. If you do not have the password, please call the hospital operator.  01/20/2021, 7:49 AM

## 2021-01-20 NOTE — Progress Notes (Signed)
NAME:  Elizabeth Melton, MRN:  [id number], DOB:  10-Jul-1941, LOS: 4 ADMISSION DATE:  01/16/2021, CONSULTATION DATE: 01/19/2021 REFERRING MD: Fayrene Helper, MD, CHIEF COMPLAINT: Rapid atrial fibrillation, hypertension  Brief History:  80 year old with history of unspecified ILD on oxygen, PE on September 2021 [recently stopped Eliquis on 2/3 as an outpatient due to side effects] admitted with dyspnea, dizziness.  CTA on admission was negative for PE but showed bilateral infiltrates consistent with COVID-19 pneumonia which was treated with remdesivir and Solu-Medrol  She is doing well until today when she developed rapid atrial fibrillation to 160s.  She is given IV metoprolol and started on Cardizem drip, transferred to progressive care.  However today afternoon her blood pressure dropped while on Cardizem drip.  PCCM consulted for help with management  Cardizem drip has been stopped.  She is currently receiving 1 L LR and an amnio bolus with heart rate better controlled to 100s  Past Medical History:    has a past medical history of Diabetes mellitus without complication ([place]), Hyperlipidemia, Hypertension, Hypothyroidism, and Thrombocytopenia ([place]).  Significant Hospital Events:  2/6-admitted with COVID-19 pneumonia 2/9-rapid atrial fibrillation with hypotension  Consults:  PCCM  Procedures:    Significant Diagnostic Tests:   CTA 01/16/2021-mild groundglass opacities superimposed on background of interstitial lung disease, mediastinal and hilar lymphadenopathy.  Micro Data:    Antimicrobials:    Interim History / Subjective:  No acute events overnight  No further hypotension with transition to Amio  Objective   Blood pressure (!) 120/59, pulse (!) 121, temperature (!) 97.4 F (36.3 C), temperature source Oral, resp. rate (!) 21, height _0  (1.676 m), weight 80.7 kg, SpO2 95 %.        Intake/Output Summary (Last 24 hours) at 01/20/2021 8916 Last data filed at  01/20/2021 9450 Gross per 24 hour  Intake 745.84 ml  Output --  Net 745.84 ml   Filed Weights   01/16/21 1312  Weight: 80.7 kg    Examination: General: Chronically ill appearing elderly female lying in bed, in NAD HEENT: [place]/AT, [place] in place MM pink/moist, PERRL,  Neuro: Alert and oriented x3, non-focal  CV: s1s2 regular rate and rhythm, no murmur, rubs, or gallops,  PULM:  Faint expiratory wheeze left greater than right, no increased work of breathing, no added breath sounds GI: soft, bowel sounds active in all 4 quadrants, non-tender, non-distended, tolerating TF Extremities: warm/dry, no edema  Skin: no rashes or lesions  Resolved Hospital Problem list     Assessment & Plan:  Rapid atrial fibrillation with hypotension P: Cardiology consulted by [facility] Continue IV Amiodarone  Beta blocker on hold Continuous telemetry  PO eliquis   COVID-19 pneumonia P: Remains stable on [place]  Continue Remdesivir and Solumedrol  Encourage self prone  Pulmonary hygiene  Encourage frequent use of IS and flutter Continue BDs  History of PE -She had a brief interruption of therapy P: Continue PO Eliquis   ILD, pulmonary nodule As needed nebs Follow-up as outpatient   Given resolution of hypotension with Amiodarone there are no more ciritcal care needs. This was discussed with her primary Dr. Florene Glen and he is comfortable assume sole care of patient.PCCM will sign off. Thank you for the opportunity to participate in this patient's care. Please contact if we can be of further assistance.  Best practice (evaluated daily)  Diet: NPO Pain/Anxiety/Delirium protocol (if indicated): NA VAP protocol (if indicated): NA DVT prophylaxis: Eliquis GI prophylaxis: NA Glucose control: Monitor Mobility: Bed  Disposition:ICU  Goals of Care:  Last date of multidisciplinary goals of care discussion:Pending Family and staff present: None Summary of discussion:  Follow up goals of care discussion  due:  Code Status: Full  Labs   CBC: Recent Labs  Lab 01/16/21 1622 01/17/21 0319 01/18/21 0401 01/19/21 0410 01/20/21 0239  WBC 2.9* 2.6* 7.5 12.5* 12.4*  NEUTROABS 1.4* 1.8 6.0 10.4* 10.4*  HGB 14.4 15.0 14.2 15.1* 15.4*  HCT 42.9 46.2* 43.6 46.5* 46.3*  MCV 98.2 100.2* 100.2* 99.8 97.9  PLT 96* 108* 138* 161 270    Basic Metabolic Panel: Recent Labs  Lab 01/17/21 0319 01/18/21 0401 01/19/21 0410 01/19/21 2004 01/20/21 0239  NA 137 135 139 141 136  K 4.0 4.1 4.3 4.5 4.7  CL 102 102 105 109 103  CO2 _0 GLUCOSE 230* 306* 258* 113* 273*  BUN 16 24* 21 32* 32*  CREATININE 0.74 0.80 0.69 1.12* 0.94  CALCIUM 7.9* 7.9* 8.4* 8.4* 8.2*  MG 2.2  --   --  2.4 2.3  PHOS  --   --   --   --  3.0   GFR: Estimated Creatinine Clearance: 52.9 mL/min (by C-G formula based on SCr of 0.94 mg/dL). Recent Labs  Lab 01/16/21 1356 01/16/21 1556 01/16/21 1622 01/17/21 0319 01/18/21 0401 01/19/21 0410 01/20/21 0239  PROCALCITON 0.10  --   --   --   --   --   --   WBC  --   --    < > 2.6* 7.5 12.5* 12.4*  LATICACIDVEN 1.8 1.4  --   --   --   --   --    < > = values in this interval not displayed.    Liver Function Tests: Recent Labs  Lab 01/16/21 1356 01/17/21 0319 01/18/21 0401 01/19/21 0410 01/20/21 0239  AST 82* 74* 57* 54* 91*  ALT 37 34 31 32 48*  ALKPHOS 54 49 50 50 53  BILITOT 0.9 0.7 0.8 0.8 0.8  PROT 7.4 6.5 6.2* 6.4* 6.2*  ALBUMIN 3.1* 2.7* 2.6* 2.6* 2.7*   No results for input(s): LIPASE, AMYLASE in the last 168 hours. No results for input(s): AMMONIA in the last 168 hours.  ABG No results found for: PHART, PCO2ART, PO2ART, HCO3, TCO2, ACIDBASEDEF, O2SAT   Coagulation Profile: No results for input(s): INR, PROTIME in the last 168 hours.  Cardiac Enzymes: No results for input(s): CKTOTAL, CKMB, CKMBINDEX, TROPONINI in the last 168 hours.  HbA1C: Hgb A1c MFr Bld  Date/Time Value Ref Range Status  01/16/2021 04:55 PM 7.6 (H) 4.8 - 5.6 %  Final    Comment:    (NOTE) Pre diabetes:          5.7%-6.4%  Diabetes:              >6.4%  Glycemic control for   <7.0% adults with diabetes   09/09/2020 02:52 AM 7.3 (H) 4.8 - 5.6 % Final    Comment:    (NOTE) Pre diabetes:          5.7%-6.4%  Diabetes:              >6.4%  Glycemic control for   <7.0% adults with diabetes     CBG: Recent Labs  Lab 01/19/21 0744 01/19/21 1154 01/19/21 1624 01/19/21 1705 01/19/21 2150  GLUCAP 348* 322* 107* 90 155*    Signature:   Johnsie Cancel, NP-C [facility] Personal contact  information can be found on Amion  If no response please page: Adult pulmonary and critical care medicine pager on Amion unitl 7pm After 7pm please call [phone] 01/20/2021, 7:10 AM

## 2021-01-20 NOTE — TOC Progression Note (Signed)
Transition of Care [facility]) - Progression Note    Patient Details  Name: Elizabeth Melton MRN: [id number] Date of Birth: January 15, 1941  [facility]) CM/SW Contact  Leeroy Cha, RN Phone Number: 01/20/2021, 7:30 AM  Clinical Narrative:    Transferred to [facility] on pm oif 020922 due to a.fib, with rvr, started on iv amiodarone, was on iv remedesivir through [id number]. Iv siolu medrol. Plan: home with [facility] for hhc.   Expected Discharge Plan: [place] Barriers to Discharge: No Barriers Identified  Expected Discharge Plan and Services Expected Discharge Plan: [place]   Discharge Planning Services: CM Consult Post Acute Care Choice: [place] arrangements for the past 2 months: Single Family Home                                       Social Determinants of Health (SDOH) Interventions    Readmission Risk Interventions Readmission Risk Prevention Plan 09/10/2020  Post Dischage Appt Complete  Medication Screening Complete  Transportation Screening Complete  Some recent data might be hidden

## 2021-01-21 ENCOUNTER — Inpatient Hospital Stay (HOSPITAL_COMMUNITY): Payer: Medicare HMO

## 2021-01-21 DIAGNOSIS — I4891 Unspecified atrial fibrillation: Secondary | ICD-10-CM | POA: Diagnosis not present

## 2021-01-21 DIAGNOSIS — U071 COVID-19: Secondary | ICD-10-CM | POA: Diagnosis not present

## 2021-01-21 DIAGNOSIS — I248 Other forms of acute ischemic heart disease: Secondary | ICD-10-CM | POA: Diagnosis not present

## 2021-01-21 DIAGNOSIS — J1282 Pneumonia due to coronavirus disease 2019: Secondary | ICD-10-CM | POA: Diagnosis not present

## 2021-01-21 LAB — D-DIMER, QUANTITATIVE: D-Dimer, Quant: 1.76 ug/mL-FEU — ABNORMAL HIGH (ref 0.00–0.50)

## 2021-01-21 LAB — CBC WITH DIFFERENTIAL/PLATELET
Abs Immature Granulocytes: 0.1 10*3/uL — ABNORMAL HIGH (ref 0.00–0.07)
Basophils Absolute: 0 10*3/uL (ref 0.0–0.1)
Basophils Relative: 0 %
Eosinophils Absolute: 0 10*3/uL (ref 0.0–0.5)
Eosinophils Relative: 0 %
HCT: 47.4 % — ABNORMAL HIGH (ref 36.0–46.0)
Hemoglobin: 15.1 g/dL — ABNORMAL HIGH (ref 12.0–15.0)
Immature Granulocytes: 1 %
Lymphocytes Relative: 9 %
Lymphs Abs: 1.2 10*3/uL (ref 0.7–4.0)
MCH: 32.4 pg (ref 26.0–34.0)
MCHC: 31.9 g/dL (ref 30.0–36.0)
MCV: 101.7 fL — ABNORMAL HIGH (ref 80.0–100.0)
Monocytes Absolute: 0.6 10*3/uL (ref 0.1–1.0)
Monocytes Relative: 4 %
Neutro Abs: 11.5 10*3/uL — ABNORMAL HIGH (ref 1.7–7.7)
Neutrophils Relative %: 86 %
Platelets: 203 10*3/uL (ref 150–400)
RBC: 4.66 MIL/uL (ref 3.87–5.11)
RDW: 14.8 % (ref 11.5–15.5)
WBC: 13.4 10*3/uL — ABNORMAL HIGH (ref 4.0–10.5)
nRBC: 0.7 % — ABNORMAL HIGH (ref 0.0–0.2)

## 2021-01-21 LAB — CULTURE, BLOOD (ROUTINE X 2)
Culture: NO GROWTH
Culture: NO GROWTH
Special Requests: ADEQUATE
Special Requests: ADEQUATE

## 2021-01-21 LAB — COMPREHENSIVE METABOLIC PANEL
ALT: 45 U/L — ABNORMAL HIGH (ref 0–44)
AST: 67 U/L — ABNORMAL HIGH (ref 15–41)
Albumin: 2.6 g/dL — ABNORMAL LOW (ref 3.5–5.0)
Alkaline Phosphatase: 55 U/L (ref 38–126)
Anion gap: 13 (ref 5–15)
BUN: 23 mg/dL (ref 8–23)
CO2: 19 mmol/L — ABNORMAL LOW (ref 22–32)
Calcium: 8.4 mg/dL — ABNORMAL LOW (ref 8.9–10.3)
Chloride: 106 mmol/L (ref 98–111)
Creatinine, Ser: 0.87 mg/dL (ref 0.44–1.00)
GFR, Estimated: >60 mL/min (ref >60)
Glucose, Bld: 267 mg/dL — ABNORMAL HIGH (ref 70–99)
Potassium: 5.1 mmol/L (ref 3.5–5.1)
Sodium: 138 mmol/L (ref 135–145)
Total Bilirubin: 0.9 mg/dL (ref 0.3–1.2)
Total Protein: 6.3 g/dL — ABNORMAL LOW (ref 6.5–8.1)

## 2021-01-21 LAB — GLUCOSE, CAPILLARY
Glucose-Capillary: 265 mg/dL — ABNORMAL HIGH (ref 70–99)
Glucose-Capillary: 315 mg/dL — ABNORMAL HIGH (ref 70–99)
Glucose-Capillary: 289 mg/dL — ABNORMAL HIGH (ref 70–99)
Glucose-Capillary: 264 mg/dL — ABNORMAL HIGH (ref 70–99)

## 2021-01-21 LAB — LIPID PANEL
Cholesterol: 105 mg/dL (ref 0–200)
HDL: 28 mg/dL — ABNORMAL LOW (ref >40)
LDL Cholesterol: 62 mg/dL (ref 0–99)
Total CHOL/HDL Ratio: 3.8 RATIO
Triglycerides: 77 mg/dL (ref <150)
VLDL: 15 mg/dL (ref 0–40)

## 2021-01-21 LAB — C-REACTIVE PROTEIN: CRP: 0.7 mg/dL (ref <1.0)

## 2021-01-21 MED ORDER — PREDNISONE 20 MG PO TABS
30.0000 mg | ORAL_TABLET | Freq: Every day | ORAL | Status: AC
  Administered 2021-01-25 – 2021-01-26 (×2): 30 mg via ORAL
  Filled 2021-01-21: qty 1

## 2021-01-21 MED ORDER — PREDNISONE 20 MG PO TABS
40.0000 mg | ORAL_TABLET | Freq: Every day | ORAL | Status: AC
  Administered 2021-01-22 – 2021-01-24 (×3): 40 mg via ORAL
  Filled 2021-01-21 (×3): qty 2

## 2021-01-21 MED ORDER — PREDNISONE 20 MG PO TABS
20.0000 mg | ORAL_TABLET | Freq: Every day | ORAL | Status: DC
  Administered 2021-01-27: 20 mg via ORAL
  Filled 2021-01-21: qty 2
  Filled 2021-01-21: qty 1

## 2021-01-21 MED ORDER — INSULIN GLARGINE 100 UNIT/ML ~~LOC~~ SOLN
20.0000 [IU] | Freq: Every day | SUBCUTANEOUS | Status: DC
  Administered 2021-01-21: 20 [IU] via SUBCUTANEOUS
  Filled 2021-01-21 (×2): qty 0.2

## 2021-01-21 MED ORDER — METOPROLOL TARTRATE 25 MG PO TABS
25.0000 mg | ORAL_TABLET | Freq: Four times a day (QID) | ORAL | Status: DC
  Administered 2021-01-21 – 2021-01-22 (×3): 25 mg via ORAL
  Filled 2021-01-21 (×3): qty 1

## 2021-01-21 MED ORDER — PREDNISONE 10 MG PO TABS: 10.0000 mg | ORAL_TABLET | Freq: Every day | ORAL | Status: DC

## 2021-01-21 NOTE — Progress Notes (Addendum)
PROGRESS NOTE    Elizabeth Melton  DIY:[id number] DOB: 1941-04-08 DOA: 01/16/2021 PCP: Kathyrn Lass, MD  Chief Complaint  Patient presents with  . Shortness of Breath  . Dizziness    Brief Narrative: Elizabeth L Brunsonis a 80 y.o.femalewith medical history significant ofILD(ANA positive)who was previously on nocturnal oxygen but has been off for some time, history of saddle PE recently stopped Eliquis, essential hypertension, hypothyroidism, hyperlipidemia, type 2 diabetes mellitus who presented to the ED with progressive shortness of breath and intermittent dizziness over the past week. Patient initially thought it was related to her Eliquis, she contacted her pulmonologist who elected to have patient stop her anticoagulant. Patient symptoms continue to progress now associated with decreased appetite, myalgias.Patient denies any sick contacts, no recent travel and no other changes in medication regimen. Patient specifically denies visual changes, no chest pain, no palpitations, no nausea/vomiting/diarrhea, no abdominal pain, no chills/night sweats, no paresthesias.  She was admitted for acute hypoxic respiratory failure 2/2 COVID 19 pneumonia.   Assessment & Plan:   Principal Problem:   Pneumonia due to COVID-19 virus Active Problems:   Hypothyroidism   Hyperlipidemia   Diabetes mellitus without complication ([place])   Essential (primary) hypertension   ILD (interstitial lung disease) ([place])   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HCC)   Acute respiratory failure with hypoxia ([place])   Atrial fibrillation with RVR (HCC)   Demand ischemia (HCC)  Atrial Fibrillation with RVR Rates in the 130's Continue amiodarone and metoprolol per cardiology (hypotensive on diltiazem) PCCM c/s 2/9 in case of need for cardioversion with hypotension - now signed off BP improved TSH wnl 2/6.  Echo with grade II diastolic dysfunction.  Severely elevated PASP.   chadvasc at least 6, she's  already on eliquis, continue Cardiology consult, appreciate assistance  Elevated Troponin   NSTEMI vs Demand Ischemia in setting of afib with RVR No CP or SOB.  She did c/o intermittent L flank pain yesterday, resolved today. Initial EKG yesterday with T wave inversion II, III, aVF, ST depression V3-V6, thought to be rate related changes.  Repeat EKG appeared improved.   Troponin peaked to 1442 Aspirin started by e link last night.  On anticoagulation with eliquis. Will repeat limited echo for wall motion abnormality Cardiology c/s, appreciate recs A1c 7.6, ldl 62  Acute hypoxic respiratory failure secondary to acute Covid-19 viral pneumonia during the ongoing Covid 19 Pandemic - POA Progressive shortness of breath with associated myalgias and decreased appetite over the last week. Patient initially thought this was related to her home Eliquis, which she stopped taking on 01/13/2021 after discussion with her pulmonologist. Patient is unvaccinated against Covid-19.  -- Currently on 2 L (wean as tolerated) -- CT PE protocol 2/6 without evidence of acute PE, superimposed covid 19 infection on a background of ILD, mediastinal and hilar adenopathy (likely reactive) --COVID test:+ 01/16/2021 --Remdesivir, plan 5-day course (Day#5/5) --Continue Solumedrolto 109m IV q12h - taper -- I/O, daily weights -- prone as able, OOB, IS, flutter  COVID-19 Labs  Recent Labs    01/19/21 0410 01/20/21 0239 01/21/21 0242  DDIMER 1.63* 1.55* 1.76*  CRP 0.7 0.7 0.7    Lab Results  Component Value Date   SARSCOV2NAA POSITIVE (A) 01/16/2021   SLa PlataNEGATIVE 09/05/2020   SStacey StreetNEGATIVE 03/13/2020   STabionaNOT DETECTED 10/20/2019   Recent saddle pulmonary embolism Patient with recent large volume thrombus including a thin saddle PE with associated right heart strain on CTA 09/05/2020. Patient was started on  IV heparin at that time and PCCM and IR consulted for TPA consideration but  patient declined. Patient was on IV heparin for 48 hours and transition to Eliquis. Patient recently stopped her Eliquis after contacting her pulmonology office as thought her current shortness of breath related to Eliquis use. CT angiogram chest negative for PE. --Continue home Eliquis  T2DM: A1c 7.6. doesn't appear she's on home meds. SSI, basal Adjust prn  Elevated LFTs: possibly related to covid vs hypotension yesterday.  Continue to follow  Essential hypertension Currently off antihypertensives.  Follow on metop  Hyperlipidemia On lovastatin 40 mg p.o. daily at home, will continue with pravastatin hospital substitution while inpatient  Interstitial lung disease History of pulmonary nodules Patient follows with pulmonology, Dr. [name] outpatient. History of ANA positive. Was previously on nocturnal oxygen, but has been off for some time. CT high-resolution chest 08/28/2018 with scattered solid pulmonary nodules, largest 7 mm basilar right upper lobe which have been stable. --Continue Combivent inhaler as above --Continue supplemental oxygen, titrate to maintain SPO2 greater than 88% --Outpatient follow-up with pulmonology on discharge  DVT prophylaxis: eliquis Code Status: full Family Communication: none at bedside - daughter 2/11 over phone Disposition:   Status is: Inpatient  Remains inpatient appropriate because:Inpatient level of care appropriate due to severity of illness   Dispo: The patient is from: Home              Anticipated d/c is to: Home              Anticipated d/c date is: > 3 days              Patient currently is not medically stable to d/c.   Difficult to place patient No  Consultants:   cardiology  Procedures:  Echo IMPRESSIONS    1. Left ventricular ejection fraction, by estimation, is 60 to 65%. The  left ventricle has normal function. The left ventricle has no regional  wall motion abnormalities. There is mild left ventricular  hypertrophy.  Left ventricular diastolic parameters  are consistent with Grade II diastolic dysfunction (pseudonormalization).  Elevated left ventricular end-diastolic pressure. The E/e' is 24.  2. Right ventricular systolic function is normal. The right ventricular  size is normal. There is severely elevated pulmonary artery systolic  pressure. The estimated right ventricular systolic pressure is 93.2 mmHg.  3. Left atrial size was severely dilated.  4. Right atrial size was mildly dilated.  5. The mitral valve is grossly normal. Trivial mitral valve  regurgitation.  6. The aortic valve is tricuspid. Aortic valve regurgitation is mild.  Mild aortic valve stenosis. Aortic valve area, by VTI measures 1.71 cm.  Aortic valve mean gradient measures 8.0 mmHg. Aortic valve Vmax measures  2.17 m/s.  7. The inferior vena cava is normal in size with greater than 50%  respiratory variability, suggesting right atrial pressure of 3 mmHg.   Comparison(s): Changes from prior study are noted. 09/06/20: LVEF 60-65%,  RVSP 70 mmHG, severely dilated and hypokinetic RV. In my review of the  previous study, the RV does not appear significantly dilated or  hypokinetic.   Antimicrobials: Anti-infectives (From admission, onward)   Start     Dose/Rate Route Frequency Ordered Stop   01/17/21 1000  remdesivir 100 mg in sodium chloride 0.9 % 100 mL IVPB       "Followed by" Linked Group Details   100 mg 200 mL/hr over 30 Minutes Intravenous Daily 01/16/21 1614 01/20/21 1135   01/16/21 1700  remdesivir 200 mg in sodium chloride 0.9% 250 mL IVPB       "Followed by" Linked Group Details   200 mg 580 mL/hr over 30 Minutes Intravenous Once 01/16/21 1614 01/16/21 1750     Subjective: Asking for help calling family - trouble with phone in room No CP or SOB Food is cold   Objective: Vitals:   01/21/21 0430 01/21/21 0500 01/21/21 0530 01/21/21 0600  BP: (!) 128/114 113/72 110/61 (!) 84/64  Pulse: (!)  135 (!) 111 (!) 106 (!) 123  Resp: 15 (!) 24 (!) 26 (!) 23  Temp:  97.9 F (36.6 C)    TempSrc:  Oral    SpO2: 96% 96% 95% 94%  Weight:      Height:        Intake/Output Summary (Last 24 hours) at 01/21/2021 0835 Last data filed at 01/21/2021 9485 Gross per 24 hour  Intake 446.12 ml  Output 800 ml  Net -353.88 ml   Filed Weights   01/16/21 1312  Weight: 80.7 kg    Examination:  General: No acute distress. Cardiovascular: irreg irreg, tachycardic Lungs: Clear to auscultation bilaterally  Abdomen: Soft, nontender, nondistended Neurological: Alert and oriented 3. Moves all extremities 4 . Cranial nerves II through XII grossly intact. Skin: Warm and dry. No rashes or lesions. Extremities: No clubbing or cyanosis. No edema.   Data Reviewed: I have personally reviewed following labs and imaging studies  CBC: Recent Labs  Lab 01/17/21 0319 01/18/21 0401 01/19/21 0410 01/20/21 0239 01/21/21 0242  WBC 2.6* 7.5 12.5* 12.4* 13.4*  NEUTROABS 1.8 6.0 10.4* 10.4* 11.5*  HGB 15.0 14.2 15.1* 15.4* 15.1*  HCT 46.2* 43.6 46.5* 46.3* 47.4*  MCV 100.2* 100.2* 99.8 97.9 101.7*  PLT 108* 138* 161 161 462    Basic Metabolic Panel: Recent Labs  Lab 01/17/21 0319 01/18/21 0401 01/19/21 0410 01/19/21 2004 01/20/21 0239 01/21/21 0242  NA 137 135 139 141 136 138  K 4.0 4.1 4.3 4.5 4.7 5.1  CL 102 102 105 109 103 106  CO2 _0 19*  GLUCOSE 230* 306* 258* 113* 273* 267*  BUN 16 24* 21 32* 32* 23  CREATININE 0.74 0.80 0.69 1.12* 0.94 0.87  CALCIUM 7.9* 7.9* 8.4* 8.4* 8.2* 8.4*  MG 2.2  --   --  2.4 2.3  --   PHOS  --   --   --   --  3.0  --     GFR: Estimated Creatinine Clearance: 57.1 mL/min (by C-G formula based on SCr of 0.87 mg/dL).  Liver Function Tests: Recent Labs  Lab 01/17/21 0319 01/18/21 0401 01/19/21 0410 01/20/21 0239 01/21/21 0242  AST 74* 57* 54* 91* 67*  ALT 34 31 32 48* 45*  ALKPHOS 49 50 50 53 55  BILITOT 0.7 0.8 0.8 0.8 0.9  PROT 6.5  6.2* 6.4* 6.2* 6.3*  ALBUMIN 2.7* 2.6* 2.6* 2.7* 2.6*    CBG: Recent Labs  Lab 01/20/21 0837 01/20/21 1111 01/20/21 1555 01/20/21 2104 01/21/21 0745  GLUCAP 262* 273* 216* 269* 265*     Recent Results (from the past 240 hour(s))  SARS Coronavirus 2 by RT PCR (hospital order, performed in [facility] hospital lab) Nasopharyngeal Nasopharyngeal Swab     Status: Abnormal   Collection Time: 01/16/21  1:56 PM   Specimen: Nasopharyngeal Swab  Result Value Ref Range Status   SARS Coronavirus 2 POSITIVE (A) NEGATIVE Final    Comment: RESULT CALLED TO, READ BACK BY  AND VERIFIED WITH: RIVERS,TJ AT 2094 ON 01/16/2021 BY JPM (NOTE) SARS-CoV-2 target nucleic acids are DETECTED  SARS-CoV-2 RNA is generally detectable in upper respiratory specimens  during the acute phase of infection.  Positive results are indicative  of the presence of the identified virus, but do not rule out bacterial infection or co-infection with other pathogens not detected by the test.  Clinical correlation with patient history and  other diagnostic information is necessary to determine patient infection status.  The expected result is negative.  Fact Sheet for Patients:   StrictlyIdeas.no   Fact Sheet for Healthcare Providers:   BankingDealers.co.za    This test is not yet approved or cleared by the [place] FDA and  has been authorized for detection and/or diagnosis of SARS-CoV-2 by FDA under an Emergency Use Authorization (EUA).  This EUA will remain in effect (meaning this  test can be used) for the duration of  the COVID-19 declaration under Section 564(b)(1) of the Act, 21 U.S.C. section 360-bbb-3(b)(1), unless the authorization is terminated or revoked sooner.  Performed at [facility], [place] [address], [place], [place] [address]   Blood Culture (routine x 2)     Status: None   Collection Time: 01/16/21  2:01 PM   Specimen: BLOOD   Result Value Ref Range Status   Specimen Description   Final    BLOOD RIGHT ANTECUBITAL Performed at [facility] [address], [place], [place] [address]    Special Requests   Final    BOTTLES DRAWN AEROBIC AND ANAEROBIC Blood Culture adequate volume Performed at [facility] [address] [place], [place] [address]    Culture   Final    NO GROWTH 5 DAYS Performed at [facility], [place] [address], [place], [place] [address]    Report Status 01/21/2021 FINAL  Final  Blood Culture (routine x 2)     Status: None   Collection Time: 01/16/21  2:30 PM   Specimen: BLOOD  Result Value Ref Range Status   Specimen Description   Final    BLOOD LEFT ANTECUBITAL BOTTLES DRAWN AEROBIC AND ANAEROBIC Blood Culture adequate volume Performed at [facility] [address] [place], [place] [address]    Special Requests   Final    BOTTLES DRAWN AEROBIC AND ANAEROBIC Blood Culture adequate volume   Culture   Final    NO GROWTH 5 DAYS Performed at [facility], [place] [address] [place], [place] [address]    Report Status 01/21/2021 FINAL  Final  MRSA PCR Screening     Status: None   Collection Time: 01/19/21  6:58 PM   Specimen: Nasal Mucosa; Nasopharyngeal  Result Value Ref Range Status   MRSA by PCR NEGATIVE NEGATIVE Final    Comment:        The GeneXpert MRSA Assay (FDA approved for NASAL specimens only), is one component of a comprehensive MRSA colonization surveillance program. It is not intended to diagnose MRSA infection nor to guide or monitor treatment for MRSA infections. Performed at [facility], [place] [address] [place], [place] [address]          Radiology Studies: No results found.      Scheduled Meds: . apixaban  5 mg Oral BID  . vitamin C  500 mg Oral Daily  . aspirin  81 mg Oral Daily  . Chlorhexidine Gluconate Cloth  6 each Topical Daily  . insulin aspart  0-15  Units Subcutaneous TID WC  . insulin glargine  15 Units Subcutaneous Daily  . Ipratropium-Albuterol  1 puff Inhalation BID  . levothyroxine  75 mcg Oral QAC breakfast  . mouth rinse  15 mL Mouth Rinse BID  . methylPREDNISolone (SOLU-MEDROL) injection  40 mg Intravenous Q12H  . metoprolol tartrate  25 mg Oral BID  . pravastatin  40 mg Oral q1800  . umeclidinium bromide  1 puff Inhalation Daily  . zinc sulfate  220 mg Oral Daily   Continuous Infusions: . amiodarone 30 mg/hr (01/21/21 0741)     LOS: 5 days    Time spent: over 30 min    Fayrene Helper, MD [facility]   To contact the attending provider between 7A-7P or the covering provider during after hours 7P-7A, please log into the web site [email or web address] and access using universal [place] password for that web site. If you do not have the password, please call the hospital operator.  01/21/2021, 8:35 AM

## 2021-01-21 NOTE — Progress Notes (Signed)
  Echocardiogram 2D Echocardiogram has been attempted. Hold on echo per Dr. Gardiner Rhyme.  Elizabeth Melton 01/21/2021, 12:50 PM

## 2021-01-21 NOTE — TOC Progression Note (Signed)
Transition of Care [facility]) - Progression Note    Patient Details  Name: Elizabeth Melton MRN: [id number] Date of Birth: [birth year]  Transition of Care [facility] CM/SW Contact  Leeroy Cha, RN Phone Number: 01/21/2021, 7:51 AM  Clinical Narrative:    Principal Problem:   Pneumonia due to COVID-19 virus Active Problems:   Hypothyroidism   Hyperlipidemia   Diabetes mellitus without complication ([place])   Essential (primary) hypertension   ILD (interstitial lung disease) ([place])   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[place])   Acute respiratory failure with hypoxia ([place])  Atrial Fibrillation with RVR Inadequate response to metop IV/PO yesterday, developed hypotension with diltiazem to 89'H systolic Now on amiodarone, BP improved, but still with RVR with rates in the 100's-110's (up to 130's when I was in the room) PCCM c/s 2/9 in case of need for cardioversion with hypotension  BP improved TSH wnl 2/6.  Echo with grade II diastolic dysfunction.  Severely elevated PASP.   chadvasc at least 6, she's already on eliquis, continue Cardiology consult, appreciate assistance  o2 at 2l/min [place], iv solu medrol, amiodarone PLAN: to return to home with family. Has [place] for hhc Expected Discharge Plan: [place] Barriers to Discharge: No Barriers Identified  Expected Discharge Plan and Services Expected Discharge Plan: [place]   Discharge Planning Services: CM Consult Post Acute Care Choice: [place] arrangements for the past 2 months: Single Family Home                                       Social Determinants of Health (SDOH) Interventions    Readmission Risk Interventions Readmission Risk Prevention Plan 09/10/2020  Post Dischage Appt Complete  Medication Screening Complete  Transportation Screening Complete  Some recent data might be hidden

## 2021-01-21 NOTE — Progress Notes (Addendum)
Progress Note  Patient Name: Elizabeth Melton Date of Encounter: 01/21/2021  [facility] Cardiologist: Ena Dawley, MD   Subjective   Denies any chest pain, reports dyspnea improved.  Reports nausea this morning.  Inpatient Medications    Scheduled Meds: . apixaban  5 mg Oral BID  . vitamin C  500 mg Oral Daily  . aspirin  81 mg Oral Daily  . Chlorhexidine Gluconate Cloth  6 each Topical Daily  . insulin aspart  0-15 Units Subcutaneous TID WC  . insulin glargine  20 Units Subcutaneous Daily  . Ipratropium-Albuterol  1 puff Inhalation BID  . levothyroxine  75 mcg Oral QAC breakfast  . mouth rinse  15 mL Mouth Rinse BID  . methylPREDNISolone (SOLU-MEDROL) injection  40 mg Intravenous Q12H  . metoprolol tartrate  25 mg Oral BID  . pravastatin  40 mg Oral q1800  . [START ON 01/22/2021] predniSONE  40 mg Oral Q breakfast   Followed by  . [START ON 01/25/2021] predniSONE  30 mg Oral Q breakfast   Followed by  . [START ON 01/27/2021] predniSONE  20 mg Oral Q breakfast   Followed by  . [START ON 01/29/2021] predniSONE  10 mg Oral Q breakfast  . umeclidinium bromide  1 puff Inhalation Daily  . zinc sulfate  220 mg Oral Daily   Continuous Infusions: . amiodarone 30 mg/hr (01/21/21 1200)   PRN Meds: acetaminophen **OR** acetaminophen, chlorpheniramine-HYDROcodone, guaiFENesin-dextromethorphan, nitroGLYCERIN, ondansetron **OR** ondansetron (ZOFRAN) IV, oxyCODONE   Vital Signs    Vitals:   01/21/21 1000 01/21/21 1100 01/21/21 1200 01/21/21 1214  BP: 134/69 (!) 132/99 107/67   Pulse: (!) 138 (!) 124 (!) 116   Resp: (!) 26     Temp:    (!) 97.4 F (36.3 C)  TempSrc:    Oral  SpO2: 95% 95% 92%   Weight:      Height:        Intake/Output Summary (Last 24 hours) at 01/21/2021 1238 Last data filed at 01/21/2021 1200 Gross per 24 hour  Intake 538.12 ml  Output 800 ml  Net -261.88 ml   Last 3 Weights 01/16/2021 11/22/2020 09/29/2020  Weight (lbs) 178 lb 177 lb 6.4 oz  171 lb 9.6 oz  Weight (kg) 80.74 kg 80.468 kg 77.837 kg      Telemetry    Atrial fibrillation, currently with rates 110s to 120s- Personally Reviewed  ECG    No new ECG - Personally Reviewed  Physical Exam   GEN: No acute distress.   Neck: No JVD Cardiac:  Regular, tachycardic no murmurs, rubs, or gallops.  Respiratory:  Diminished breath sounds GI: Soft MS: No edema; No deformity. Neuro:  Nonfocal  Psych: Normal affect   Labs    High Sensitivity Troponin:   Recent Labs  Lab 01/16/21 1608 01/19/21 1816 01/19/21 2004 01/20/21 0727 01/20/21 1636  TROPONINIHS 13 400* 746* 1,442* 1,209*      Chemistry Recent Labs  Lab 01/19/21 0410 01/19/21 2004 01/20/21 0239 01/21/21 0242  NA 139 141 136 138  K 4.3 4.5 4.7 5.1  CL 105 109 103 106  CO2 _0 19*  GLUCOSE 258* 113* 273* 267*  BUN 21 32* 32* 23  CREATININE 0.69 1.12* 0.94 0.87  CALCIUM 8.4* 8.4* 8.2* 8.4*  PROT 6.4*  --  6.2* 6.3*  ALBUMIN 2.6*  --  2.7* 2.6*  AST 54*  --  91* 67*  ALT 32  --  48* 45*  ALKPHOS  50  --  53 55  BILITOT 0.8  --  0.8 0.9  GFRNONAA >60 50* >60 >60  ANIONGAP _0 Hematology Recent Labs  Lab 01/19/21 0410 01/20/21 0239 01/21/21 0242  WBC 12.5* 12.4* 13.4*  RBC 4.66 4.73 4.66  HGB 15.1* 15.4* 15.1*  HCT 46.5* 46.3* 47.4*  MCV 99.8 97.9 101.7*  MCH 32.4 32.6 32.4  MCHC 32.5 33.3 31.9  RDW 14.2 14.4 14.8  PLT 161 161 203    BNP Recent Labs  Lab 01/16/21 1408  BNP 115.0*     DDimer  Recent Labs  Lab 01/19/21 0410 01/20/21 0239 01/21/21 0242  DDIMER 1.63* 1.55* 1.76*     Radiology    No results found.  Cardiac Studies   Echo 01/07/21: 1. Left ventricular ejection fraction, by estimation, is 60 to 65%. The  left ventricle has normal function. The left ventricle has no regional  wall motion abnormalities. There is mild left ventricular hypertrophy.  Left ventricular diastolic parameters  are consistent with Grade II diastolic dysfunction  (pseudonormalization).  Elevated left ventricular end-diastolic pressure. The E/e' is 24.  2. Right ventricular systolic function is normal. The right ventricular  size is normal. There is severely elevated pulmonary artery systolic  pressure. The estimated right ventricular systolic pressure is 74.8 mmHg.  3. Left atrial size was severely dilated.  4. Right atrial size was mildly dilated.  5. The mitral valve is grossly normal. Trivial mitral valve  regurgitation.  6. The aortic valve is tricuspid. Aortic valve regurgitation is mild.  Mild aortic valve stenosis. Aortic valve area, by VTI measures 1.71 cm.  Aortic valve mean gradient measures 8.0 mmHg. Aortic valve Vmax measures  2.17 m/s.  7. The inferior vena cava is normal in size with greater than 50%  respiratory variability, suggesting right atrial pressure of 3 mmHg.   Patient Profile     80 y.o. female with a hx of HTN, HLD, DM type 2, hypothyroidism, current tobacco use, vitamin D deficiency, thrombocytopenia, mildly +DS DNA ab, mild AI w/ RVSP 70 & hypokinetic RV 2nd PE on echo 08/2020 >> improved on echo 01/07/2021, who is being seen today for the evaluation of atrial fib, RVR, elevated troponin, at the request of Dr Florene Glen.  Assessment & Plan    Atrial fibrillation with RVR: New diagnosis, in setting of COVID-19 infection.  Had been on Eliquis given PE history but was recently discontinued.  Initially with hypotension with rates in 160s.  Started on amiodarone drip with improvement in rate and BP.  Suspect being driven by acute illness with COVID-19 pneumonia -Continue Eliquis 5 mg twice daily -Continue IV amiodarone -Became hypotensive with diltiazem.  BP improved, started on low-dose metoprolol yesterday.  Tolerating well, BP stable.  Increase metoprolol dose to 25 mg every 6 hours for better rate control.  Troponin elevation: troponin up to 1442.  She denies any chest pain.  Suspect demand ischemia in setting of AF  with RVR and hypotension on 2/9.  Echocardiogram ordered, can assess once HR better controlled.   For questions or updates, please contact [place] Please consult [email or web address] for contact info under        Signed, [name], MD  01/21/2021, 12:38 PM

## 2021-01-22 ENCOUNTER — Other Ambulatory Visit: Payer: Self-pay

## 2021-01-22 ENCOUNTER — Inpatient Hospital Stay (HOSPITAL_COMMUNITY): Payer: Medicare HMO

## 2021-01-22 DIAGNOSIS — I350 Nonrheumatic aortic (valve) stenosis: Secondary | ICD-10-CM

## 2021-01-22 DIAGNOSIS — I361 Nonrheumatic tricuspid (valve) insufficiency: Secondary | ICD-10-CM

## 2021-01-22 DIAGNOSIS — U071 COVID-19: Secondary | ICD-10-CM | POA: Diagnosis not present

## 2021-01-22 DIAGNOSIS — I351 Nonrheumatic aortic (valve) insufficiency: Secondary | ICD-10-CM

## 2021-01-22 DIAGNOSIS — J1282 Pneumonia due to coronavirus disease 2019: Secondary | ICD-10-CM | POA: Diagnosis not present

## 2021-01-22 DIAGNOSIS — I248 Other forms of acute ischemic heart disease: Secondary | ICD-10-CM

## 2021-01-22 DIAGNOSIS — I10 Essential (primary) hypertension: Secondary | ICD-10-CM

## 2021-01-22 DIAGNOSIS — I340 Nonrheumatic mitral (valve) insufficiency: Secondary | ICD-10-CM | POA: Diagnosis not present

## 2021-01-22 DIAGNOSIS — R079 Chest pain, unspecified: Secondary | ICD-10-CM | POA: Diagnosis not present

## 2021-01-22 DIAGNOSIS — I4891 Unspecified atrial fibrillation: Secondary | ICD-10-CM

## 2021-01-22 LAB — CBC WITH DIFFERENTIAL/PLATELET
Abs Immature Granulocytes: 0.12 10*3/uL — ABNORMAL HIGH (ref 0.00–0.07)
Basophils Absolute: 0 10*3/uL (ref 0.0–0.1)
Basophils Relative: 0 %
Eosinophils Absolute: 0 10*3/uL (ref 0.0–0.5)
Eosinophils Relative: 0 %
HCT: 47.4 % — ABNORMAL HIGH (ref 36.0–46.0)
Hemoglobin: 15.4 g/dL — ABNORMAL HIGH (ref 12.0–15.0)
Immature Granulocytes: 1 %
Lymphocytes Relative: 7 %
Lymphs Abs: 1 10*3/uL (ref 0.7–4.0)
MCH: 32.6 pg (ref 26.0–34.0)
MCHC: 32.5 g/dL (ref 30.0–36.0)
MCV: 100.2 fL — ABNORMAL HIGH (ref 80.0–100.0)
Monocytes Absolute: 0.7 10*3/uL (ref 0.1–1.0)
Monocytes Relative: 5 %
Neutro Abs: 13.5 10*3/uL — ABNORMAL HIGH (ref 1.7–7.7)
Neutrophils Relative %: 87 %
Platelets: 216 10*3/uL (ref 150–400)
RBC: 4.73 MIL/uL (ref 3.87–5.11)
RDW: 14.6 % (ref 11.5–15.5)
WBC: 15.3 10*3/uL — ABNORMAL HIGH (ref 4.0–10.5)
nRBC: 0.5 % — ABNORMAL HIGH (ref 0.0–0.2)

## 2021-01-22 LAB — COMPREHENSIVE METABOLIC PANEL
ALT: 39 U/L (ref 0–44)
AST: 42 U/L — ABNORMAL HIGH (ref 15–41)
Albumin: 2.5 g/dL — ABNORMAL LOW (ref 3.5–5.0)
Alkaline Phosphatase: 53 U/L (ref 38–126)
Anion gap: 11 (ref 5–15)
BUN: 29 mg/dL — ABNORMAL HIGH (ref 8–23)
CO2: 24 mmol/L (ref 22–32)
Calcium: 8 mg/dL — ABNORMAL LOW (ref 8.9–10.3)
Chloride: 100 mmol/L (ref 98–111)
Creatinine, Ser: 0.9 mg/dL (ref 0.44–1.00)
GFR, Estimated: >60 mL/min (ref >60)
Glucose, Bld: 348 mg/dL — ABNORMAL HIGH (ref 70–99)
Potassium: 4.7 mmol/L (ref 3.5–5.1)
Sodium: 135 mmol/L (ref 135–145)
Total Bilirubin: 0.8 mg/dL (ref 0.3–1.2)
Total Protein: 5.9 g/dL — ABNORMAL LOW (ref 6.5–8.1)

## 2021-01-22 LAB — ECHOCARDIOGRAM LIMITED
Calc EF: 42.5 %
Height: 66 in
MV M vel: 4.68 m/s
MV Peak grad: 87.6 mmHg
P 1/2 time: 335 msec
Radius: 0.4 cm
S' Lateral: 2.8 cm
Single Plane A2C EF: 34.7 %
Single Plane A4C EF: 49.5 %
Weight: 2848 oz

## 2021-01-22 LAB — MAGNESIUM: Magnesium: 2.4 mg/dL (ref 1.7–2.4)

## 2021-01-22 LAB — GLUCOSE, CAPILLARY
Glucose-Capillary: 230 mg/dL — ABNORMAL HIGH (ref 70–99)
Glucose-Capillary: 241 mg/dL — ABNORMAL HIGH (ref 70–99)
Glucose-Capillary: 221 mg/dL — ABNORMAL HIGH (ref 70–99)
Glucose-Capillary: 212 mg/dL — ABNORMAL HIGH (ref 70–99)
Glucose-Capillary: 209 mg/dL — ABNORMAL HIGH (ref 70–99)

## 2021-01-22 LAB — D-DIMER, QUANTITATIVE: D-Dimer, Quant: 1.16 ug/mL-FEU — ABNORMAL HIGH (ref 0.00–0.50)

## 2021-01-22 LAB — C-REACTIVE PROTEIN: CRP: 0.7 mg/dL (ref <1.0)

## 2021-01-22 LAB — PHOSPHORUS: Phosphorus: 2.8 mg/dL (ref 2.5–4.6)

## 2021-01-22 MED ORDER — POLYETHYLENE GLYCOL 3350 17 G PO PACK
17.0000 g | PACK | Freq: Two times a day (BID) | ORAL | Status: DC
  Administered 2021-01-22 (×2): 17 g via ORAL
  Filled 2021-01-22 (×2): qty 1

## 2021-01-22 MED ORDER — LACTATED RINGERS IV BOLUS: 1000.0000 mL | Freq: Once | INTRAVENOUS | Status: DC

## 2021-01-22 MED ORDER — LACTATED RINGERS IV BOLUS: 500.0000 mL | Freq: Once | INTRAVENOUS | Status: DC

## 2021-01-22 MED ORDER — PERFLUTREN LIPID MICROSPHERE
1.0000 mL | INTRAVENOUS | Status: AC | PRN
  Administered 2021-01-22: 2 mL via INTRAVENOUS
  Filled 2021-01-22: qty 10

## 2021-01-22 MED ORDER — SENNOSIDES-DOCUSATE SODIUM 8.6-50 MG PO TABS
2.0000 | ORAL_TABLET | Freq: Every day | ORAL | Status: DC
  Administered 2021-01-22 – 2021-01-25 (×4): 2 via ORAL
  Filled 2021-01-22 (×4): qty 2

## 2021-01-22 MED ORDER — BISACODYL 10 MG RE SUPP
10.0000 mg | Freq: Every day | RECTAL | Status: DC | PRN
  Administered 2021-01-22: 10 mg via RECTAL
  Filled 2021-01-22: qty 1

## 2021-01-22 MED ORDER — LACTATED RINGERS IV BOLUS
500.0000 mL | Freq: Once | INTRAVENOUS | Status: AC
  Administered 2021-01-22: 500 mL via INTRAVENOUS

## 2021-01-22 MED ORDER — INSULIN GLARGINE 100 UNIT/ML ~~LOC~~ SOLN
25.0000 [IU] | Freq: Every day | SUBCUTANEOUS | Status: DC
  Administered 2021-01-22: 25 [IU] via SUBCUTANEOUS
  Filled 2021-01-22 (×2): qty 0.25

## 2021-01-22 NOTE — Progress Notes (Signed)
Progress Note   Subjective   Clinically stable.  No events overnight.  Remains in afib.  Inpatient Medications    Scheduled Meds:  apixaban  5 mg Oral BID   vitamin C  500 mg Oral Daily   aspirin  81 mg Oral Daily   Chlorhexidine Gluconate Cloth  6 each Topical Daily   insulin aspart  0-15 Units Subcutaneous TID WC   insulin glargine  25 Units Subcutaneous Daily   Ipratropium-Albuterol  1 puff Inhalation BID   levothyroxine  75 mcg Oral QAC breakfast   mouth rinse  15 mL Mouth Rinse BID   metoprolol tartrate  25 mg Oral Q6H   polyethylene glycol  17 g Oral BID   pravastatin  40 mg Oral q1800   predniSONE  40 mg Oral Q breakfast   Followed by   Derrill Memo ON 01/25/2021] predniSONE  30 mg Oral Q breakfast   Followed by   Derrill Memo ON 01/27/2021] predniSONE  20 mg Oral Q breakfast   Followed by   Derrill Memo ON 01/29/2021] predniSONE  10 mg Oral Q breakfast   umeclidinium bromide  1 puff Inhalation Daily   zinc sulfate  220 mg Oral Daily   Continuous Infusions:  amiodarone 30 mg/hr (01/22/21 0800)   PRN Meds: acetaminophen **OR** acetaminophen, bisacodyl, chlorpheniramine-HYDROcodone, guaiFENesin-dextromethorphan, nitroGLYCERIN, ondansetron **OR** ondansetron (ZOFRAN) IV, oxyCODONE, perflutren lipid microspheres (DEFINITY) IV suspension   Vital Signs    Vitals:   01/22/21 0500 01/22/21 0600 01/22/21 0700 01/22/21 0800  BP: (!) 114/57 117/62 98/78 (!) 92/59  Pulse:   (!) 131 (!) 118  Resp: (!) 26 (!) 28 (!) 23 (!) 27  Temp:   97.7 F (36.5 C)   TempSrc:   Oral   SpO2:   94% 94%  Weight:      Height:        Intake/Output Summary (Last 24 hours) at 01/22/2021 0915 Last data filed at 01/22/2021 0800 Gross per 24 hour  Intake 376.98 ml  Output 250 ml  Net 126.98 ml   Filed Weights   01/16/21 1312  Weight: 80.7 kg    Telemetry    Afib, V rates 110s - Personally Reviewed  Physical Exam   GEN- The patient is ill appearing  Limited exam due to COVID  19 Lungs-  normal work of breathing Heart- irregular rate and rhythm    Labs    Chemistry Recent Labs  Lab 01/20/21 0239 01/21/21 0242 01/22/21 0248  NA 136 138 135  K 4.7 5.1 4.7  CL 103 106 100  CO2 22 19* 24  GLUCOSE 273* 267* 348*  BUN 32* 23 29*  CREATININE 0.94 0.87 0.90  CALCIUM 8.2* 8.4* 8.0*  PROT 6.2* 6.3* 5.9*  ALBUMIN 2.7* 2.6* 2.5*  AST 91* 67* 42*  ALT 48* 45* 39  ALKPHOS 53 55 53  BILITOT 0.8 0.9 0.8  GFRNONAA >60 >60 >60  ANIONGAP _0 Hematology Recent Labs  Lab 01/20/21 0239 01/21/21 0242 01/22/21 0248  WBC 12.4* 13.4* 15.3*  RBC 4.73 4.66 4.73  HGB 15.4* 15.1* 15.4*  HCT 46.3* 47.4* 47.4*  MCV 97.9 101.7* 100.2*  MCH 32.6 32.4 32.6  MCHC 33.3 31.9 32.5  RDW 14.4 14.8 14.6  PLT 161 203 216     Patient ID  80 y.o. female with a hx of HTN, HLD, DM type 2, hypothyroidism, current tobacco use, vitamin D deficiency, thrombocytopenia, mildly +DS DNA ab, mild AI w/ RVSP 70 &  hypokinetic RV2nd PEon echo 08/2020 >> improved on echo 01/07/2021,who is being seen today for the evaluation of atrial fib, RVR, elevated troponin,at the request of Dr Florene Glen.  Assessment & Plan    1.  afib with RVR Likely exacerbated by acute medical condition Continue current rate control Could consider increasing metoprolol to 38m q6h if V rates elevate dfurther chads2vasc score is at least 6 and patient with recent PTE.  [place] Continue IV amiodarone for now for rate control.  As she clinically improves, would transition to amiodarone 2046mdaily.  She has severe LA enlargement, suggesting that AF may not be new and that rhythm control may eventually prove to be a challenge. Will need close follow-up on discharge in the AF clinic. Repeat echo from today is pending (echo tech just left the room).  2. HTN Stable No change required today  3. PTE Severe PASP elevation on recent echo.  Repeat echo is pending  4. Elevated  HStrop Not acute MI Secondary to PTE and AF with RVR  5. COVID 19 pneumonia Likely driving all of the above Prognosis is guarded  Cardiology to see again on Monday  JaThompson GrayerD, [facility]/11/2021 9:15 AM

## 2021-01-22 NOTE — Progress Notes (Signed)
Evaluated pt in regards to earlier complaint She c/o lightheadedness (doesn't sound like vertigo) and feeling like she had to lay her head down to the side.  Can't clearly describe other symptoms, just didn't feel good.   Vitals per discussion with RN with HR 85, BP 122/66 Vitals:   01/22/21 1700 01/22/21 1800  BP: (!) 109/46 103/63  Pulse: 71 (!) 116  Resp: 20 (!) 28  Temp:    SpO2: 93% 92%   AP 80 yo female with ILD, PE on eliquis, HTN, hypothyroid, HLD, T2DM admitted with covid, now with hospitalization c/b afib with RVR c/o lightheadedness.  Lightheadedness: sx resolved without intervention.  BP didn't appear low.  EKG with RVR.  No vertigo.  Exam without focal neurologic deficit.  Unclear what caused symptoms, ? RVR vs relative hypotension.  Will give bolus and follow symptoms.

## 2021-01-22 NOTE — Progress Notes (Signed)
Pt noted to have HR in the 40's starting at approx 1850. Upon assessment pt asymptomatic with no complaints. BP did drop slightly to 96/39 (57). 12 lead obtained and showed Sinus Bradycardia. Dr. Florene Glen called and made aware. Per Dr. Florene Glen he will add parameters to cardiac meds, stop amio gtt, and since pt is asymptomatic will just continue to monitor at this time.

## 2021-01-22 NOTE — Progress Notes (Signed)
PROGRESS NOTE    Elizabeth Melton  NTZ:[id number] DOB: 07-09-1941 DOA: 01/16/2021 PCP: Kathyrn Lass, MD  Chief Complaint  Patient presents with  . Shortness of Breath  . Dizziness    Brief Narrative: Elizabeth Melton Elizabeth Melton 80 y.o.femalewith medical history significant ofILD(ANA positive)who was previously on nocturnal oxygen but has been off for some time, history of saddle PE recently stopped Eliquis, essential hypertension, hypothyroidism, hyperlipidemia, type 2 diabetes mellitus who presented to the ED with progressive shortness of breath and intermittent dizziness over the past week. Patient initially thought it was related to her Eliquis, she contacted her pulmonologist who elected to have patient stop her anticoagulant. Patient symptoms continue to progress now associated with decreased appetite, myalgias.Patient denies any sick contacts, no recent travel and no other changes in medication regimen. Patient specifically denies visual changes, no chest pain, no palpitations, no nausea/vomiting/diarrhea, no abdominal pain, no chills/night sweats, no paresthesias.  She was admitted for acute hypoxic respiratory failure 2/2 COVID 19 pneumonia.   Assessment & Plan:   Principal Problem:   Pneumonia due to COVID-19 virus Active Problems:   Hypothyroidism   Hyperlipidemia   Diabetes mellitus without complication ([place])   Essential (primary) hypertension   ILD (interstitial lung disease) ([place])   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HCC)   Acute respiratory failure with hypoxia ([place])   Atrial fibrillation with RVR (HCC)   Demand ischemia (HCC)  Atrial Fibrillation with RVR Rates in the 100s this AM, pressures soft this AM (SBP in 90's) Continue amiodarone and metoprolol per cardiology (hypotensive on diltiazem) PCCM c/s 2/9 in case of need for cardioversion with hypotension - now signed off TSH wnl 2/6.  Echo with grade II diastolic dysfunction.  Severely elevated PASP.    chadvasc at least 6, she's already on eliquis, continue Cardiology consult, appreciate assistance  Elevated Troponin   NSTEMI vs Demand Ischemia in setting of afib with RVR No CP or SOB.  She did c/o intermittent L flank pain yesterday, resolved today. Initial EKG yesterday with T wave inversion II, III, aVF, ST depression V3-V6, thought to be rate related changes.  Repeat EKG appeared improved.   Troponin peaked to 1442 Aspirin started by e link last night.  On anticoagulation with eliquis. Will repeat limited echo for wall motion abnormality (when able based on HR)  Cardiology c/s, appreciate recs A1c 7.6, ldl 62  Acute hypoxic respiratory failure secondary to acute Covid-19 viral pneumonia during the ongoing Covid 19 Pandemic - POA Progressive shortness of breath with associated myalgias and decreased appetite over the last week. Patient initially thought this was related to her home Eliquis, which she stopped taking on 01/13/2021 after discussion with her pulmonologist. Patient is unvaccinated against Covid-19.  -- Currently on RA (adjust as tolerated) -- CT PE protocol 2/6 without evidence of acute PE, superimposed covid 19 infection on Rudolph Dobler background of ILD, mediastinal and hilar adenopathy (likely reactive) --COVID test:+ 01/16/2021 --Remdesivir, plan 5-day course (Day#5/5) --Continue Solumedrolto 101m IV q12h - taper -- I/O, daily weights -- prone as able, OOB, IS, flutter  COVID-19 Labs  Recent Labs    01/20/21 0239 01/21/21 0242 01/22/21 0248  DDIMER 1.55* 1.76* 1.16*  CRP 0.7 0.7 0.7    Lab Results  Component Value Date   SARSCOV2NAA POSITIVE (Jaquille Kau) 01/16/2021   SFordsNEGATIVE 09/05/2020   SStanwoodNEGATIVE 03/13/2020   SLakelineNOT DETECTED 10/20/2019   Recent saddle pulmonary embolism Patient with recent large volume thrombus including Neda Willenbring thin saddle PE  with associated right heart strain on CTA 09/05/2020. Patient was started on IV heparin at that  time and PCCM and IR consulted for TPA consideration but patient declined. Patient was on IV heparin for 48 hours and transition to Eliquis. Patient recently stopped her Eliquis after contacting her pulmonology office as thought her current shortness of breath related to Eliquis use. CT angiogram chest negative for PE. --Continue home Eliquis  T2DM: A1c 7.6. doesn't appear she's on home meds. SSI, basal Adjust prn  Elevated LFTs: possibly related to covid vs hypotension yesterday.  Continue to follow  Essential hypertension Currently off antihypertensives.  Follow on metop  Hyperlipidemia On lovastatin 40 mg p.o. daily at home, will continue with pravastatin hospital substitution while inpatient  Interstitial lung disease History of pulmonary nodules Patient follows with pulmonology, Dr. [name] outpatient. History of ANA positive. Was previously on nocturnal oxygen, but has been off for some time. CT high-resolution chest 08/28/2018 with scattered solid pulmonary nodules, largest 7 mm basilar right upper lobe which have been stable. --Continue Combivent inhaler as above --Continue supplemental oxygen, titrate to maintain SPO2 greater than 88% --Outpatient follow-up with pulmonology on discharge  DVT prophylaxis: eliquis Code Status: full Family Communication: none at bedside - daughter 2/11 over phone Disposition:   Status is: Inpatient  Remains inpatient appropriate because:Inpatient level of care appropriate due to severity of illness   Dispo: The patient is from: Home              Anticipated d/c is to: Home              Anticipated d/c date is: > 3 days              Patient currently is not medically stable to d/c.   Difficult to place patient No  Consultants:   cardiology  Procedures:  Echo IMPRESSIONS    1. Left ventricular ejection fraction, by estimation, is 60 to 65%. The  left ventricle has normal function. The left ventricle has no regional   wall motion abnormalities. There is mild left ventricular hypertrophy.  Left ventricular diastolic parameters  are consistent with Grade II diastolic dysfunction (pseudonormalization).  Elevated left ventricular end-diastolic pressure. The E/e' is 24.  2. Right ventricular systolic function is normal. The right ventricular  size is normal. There is severely elevated pulmonary artery systolic  pressure. The estimated right ventricular systolic pressure is 94.8 mmHg.  3. Left atrial size was severely dilated.  4. Right atrial size was mildly dilated.  5. The mitral valve is grossly normal. Trivial mitral valve  regurgitation.  6. The aortic valve is tricuspid. Aortic valve regurgitation is mild.  Mild aortic valve stenosis. Aortic valve area, by VTI measures 1.71 cm.  Aortic valve mean gradient measures 8.0 mmHg. Aortic valve Vmax measures  2.17 m/s.  7. The inferior vena cava is normal in size with greater than 50%  respiratory variability, suggesting right atrial pressure of 3 mmHg.   Comparison(s): Changes from prior study are noted. 09/06/20: LVEF 60-65%,  RVSP 70 mmHG, severely dilated and hypokinetic RV. In my review of the  previous study, the RV does not appear significantly dilated or  hypokinetic.   Antimicrobials: Anti-infectives (From admission, onward)   Start     Dose/Rate Route Frequency Ordered Stop   01/17/21 1000  remdesivir 100 mg in sodium chloride 0.9 % 100 mL IVPB       "Followed by" Linked Group Details   100 mg 200 mL/hr over  30 Minutes Intravenous Daily 01/16/21 1614 01/20/21 1135   01/16/21 1700  remdesivir 200 mg in sodium chloride 0.9% 250 mL IVPB       "Followed by" Linked Group Details   200 mg 580 mL/hr over 30 Minutes Intravenous Once 01/16/21 1614 01/16/21 1750     Subjective: C/o nausea - constipation  Objective: Vitals:   01/22/21 0401 01/22/21 0500 01/22/21 0600 01/22/21 0700  BP:  (!) 114/57 117/62 98/78  Pulse:    (!) 131  Resp:  18 (!) 26 (!) 28 (!) 23  Temp:    97.7 F (36.5 C)  TempSrc:    Oral  SpO2:    94%  Weight:      Height:        Intake/Output Summary (Last 24 hours) at 01/22/2021 0827 Last data filed at 01/22/2021 0600 Gross per 24 hour  Intake 165.19 ml  Output 750 ml  Net -584.81 ml   Filed Weights   01/16/21 1312  Weight: 80.7 kg    Examination:  General: No acute distress. Cardiovascular: irreg irreg, tachy Lungs: Clear to auscultation bilaterally Abdomen: Soft, nontender, nondistended Neurological: Alert and oriented 3. Moves all extremities 4. Cranial nerves II through XII grossly intact. Skin: Warm and dry. No rashes or lesions. Extremities: No clubbing or cyanosis. No edema.   Data Reviewed: I have personally reviewed following labs and imaging studies  CBC: Recent Labs  Lab 01/18/21 0401 01/19/21 0410 01/20/21 0239 01/21/21 0242 01/22/21 0248  WBC 7.5 12.5* 12.4* 13.4* 15.3*  NEUTROABS 6.0 10.4* 10.4* 11.5* 13.5*  HGB 14.2 15.1* 15.4* 15.1* 15.4*  HCT 43.6 46.5* 46.3* 47.4* 47.4*  MCV 100.2* 99.8 97.9 101.7* 100.2*  PLT 138* 161 161 203 147    Basic Metabolic Panel: Recent Labs  Lab 01/17/21 0319 01/18/21 0401 01/19/21 0410 01/19/21 2004 01/20/21 0239 01/21/21 0242 01/22/21 0248  NA 137   < > 139 141 136 138 135  K 4.0   < > 4.3 4.5 4.7 5.1 4.7  CL 102   < > 105 109 103 106 100  CO2 25   < > _0 19* 24  GLUCOSE 230*   < > 258* 113* 273* 267* 348*  BUN 16   < > 21 32* 32* 23 29*  CREATININE 0.74   < > 0.69 1.12* 0.94 0.87 0.90  CALCIUM 7.9*   < > 8.4* 8.4* 8.2* 8.4* 8.0*  MG 2.2  --   --  2.4 2.3  --  2.4  PHOS  --   --   --   --  3.0  --  2.8   < > = values in this interval not displayed.    GFR: Estimated Creatinine Clearance: 55.2 mL/min (by C-G formula based on SCr of 0.9 mg/dL).  Liver Function Tests: Recent Labs  Lab 01/18/21 0401 01/19/21 0410 01/20/21 0239 01/21/21 0242 01/22/21 0248  AST 57* 54* 91* 67* 42*  ALT 31 32 48* 45*  39  ALKPHOS 50 50 53 55 53  BILITOT 0.8 0.8 0.8 0.9 0.8  PROT 6.2* 6.4* 6.2* 6.3* 5.9*  ALBUMIN 2.6* 2.6* 2.7* 2.6* 2.5*    CBG: Recent Labs  Lab 01/21/21 0745 01/21/21 1212 01/21/21 1736 01/21/21 2200 01/22/21 0753  GLUCAP 265* 315* 289* 264* 230*     Recent Results (from the past 240 hour(s))  SARS Coronavirus 2 by RT PCR (hospital order, performed in [facility] hospital lab) Nasopharyngeal Nasopharyngeal Swab     Status:  Abnormal   Collection Time: 01/16/21  1:56 PM   Specimen: Nasopharyngeal Swab  Result Value Ref Range Status   SARS Coronavirus 2 POSITIVE (Liron Eissler) NEGATIVE Final    Comment: RESULT CALLED TO, READ BACK BY AND VERIFIED WITH: RIVERS,TJ AT 1552 ON 01/16/2021 BY JPM (NOTE) SARS-CoV-2 target nucleic acids are DETECTED  SARS-CoV-2 RNA is generally detectable in upper respiratory specimens  during the acute phase of infection.  Positive results are indicative  of the presence of the identified virus, but do not rule out bacterial infection or co-infection with other pathogens not detected by the test.  Clinical correlation with patient history and  other diagnostic information is necessary to determine patient infection status.  The expected result is negative.  Fact Sheet for Patients:   StrictlyIdeas.no   Fact Sheet for Healthcare Providers:   BankingDealers.co.za    This test is not yet approved or cleared by the [place] FDA and  has been authorized for detection and/or diagnosis of SARS-CoV-2 by FDA under an Emergency Use Authorization (EUA).  This EUA will remain in effect (meaning this  test can be used) for the duration of  the COVID-19 declaration under Section 564(b)(1) of the Act, 21 U.S.C. section 360-bbb-3(b)(1), unless the authorization is terminated or revoked sooner.  Performed at [facility], [place] [address], [place], [place] [address]   Blood Culture (routine x 2)      Status: None   Collection Time: 01/16/21  2:01 PM   Specimen: BLOOD  Result Value Ref Range Status   Specimen Description   Final    BLOOD RIGHT ANTECUBITAL Performed at [facility] [address], [place], [place] [address]    Special Requests   Final    BOTTLES DRAWN AEROBIC AND ANAEROBIC Blood Culture adequate volume Performed at [facility] [address] [place], [place] [address]    Culture   Final    NO GROWTH 5 DAYS Performed at [facility], [place] [address], [place], [place] [address]    Report Status 01/21/2021 FINAL  Final  Blood Culture (routine x 2)     Status: None   Collection Time: 01/16/21  2:30 PM   Specimen: BLOOD  Result Value Ref Range Status   Specimen Description   Final    BLOOD LEFT ANTECUBITAL BOTTLES DRAWN AEROBIC AND ANAEROBIC Blood Culture adequate volume Performed at [facility] [address] [place], [place] [address]    Special Requests   Final    BOTTLES DRAWN AEROBIC AND ANAEROBIC Blood Culture adequate volume   Culture   Final    NO GROWTH 5 DAYS Performed at [facility], [place] [address], [place], [place] [address]    Report Status 01/21/2021 FINAL  Final  MRSA PCR Screening     Status: None   Collection Time: 01/19/21  6:58 PM   Specimen: Nasal Mucosa; Nasopharyngeal  Result Value Ref Range Status   MRSA by PCR NEGATIVE NEGATIVE Final    Comment:        The GeneXpert MRSA Assay (FDA approved for NASAL specimens only), is one component of [facility] comprehensive MRSA colonization surveillance program. It is not intended to diagnose MRSA infection nor to guide or monitor treatment for MRSA infections. Performed at [facility], [place] [address] [place], [place] [address]          Radiology Studies: No results found.      Scheduled  Meds: . apixaban  5 mg Oral BID  . vitamin C  500 mg Oral Daily  . aspirin  81 mg Oral Daily  .  Chlorhexidine Gluconate Cloth  6 each Topical Daily  . insulin aspart  0-15 Units Subcutaneous TID WC  . insulin glargine  20 Units Subcutaneous Daily  . Ipratropium-Albuterol  1 puff Inhalation BID  . levothyroxine  75 mcg Oral QAC breakfast  . mouth rinse  15 mL Mouth Rinse BID  . metoprolol tartrate  25 mg Oral Q6H  . pravastatin  40 mg Oral q1800  . predniSONE  40 mg Oral Q breakfast   Followed by  . [START ON 01/25/2021] predniSONE  30 mg Oral Q breakfast   Followed by  . [START ON 01/27/2021] predniSONE  20 mg Oral Q breakfast   Followed by  . [START ON 01/29/2021] predniSONE  10 mg Oral Q breakfast  . umeclidinium bromide  1 puff Inhalation Daily  . zinc sulfate  220 mg Oral Daily   Continuous Infusions: . amiodarone 30 mg/hr (01/22/21 0103)     LOS: 6 days    Time spent: over 30 min    Fayrene Helper, MD [facility]   To contact the attending provider between 7A-7P or the covering provider during after hours 7P-7A, please log into the web site [email or web address] and access using universal [place] password for that web site. If you do not have the password, please call the hospital operator.  01/22/2021, 8:27 AM

## 2021-01-22 NOTE — Progress Notes (Signed)
Pt called RN. Complained of dizziness. Patient states, "I'm dizzy, I cannot lift my head, I'm going to die." She did visually appear to be in some distress.  BP 122/66, O2 95% on 2L, EKG obtained and showed Afib at a rate of 112, CBG 221. Dr. Florene Glen made aware.

## 2021-01-22 NOTE — Progress Notes (Signed)
  Echocardiogram 2D Echocardiogram has been performed.  Elizabeth Melton 01/22/2021, 9:15 AM

## 2021-01-22 NOTE — Progress Notes (Signed)
Physical Therapy Treatment Patient Details Name: Elizabeth Melton MRN: [id number] DOB: 1941-12-02 Today's Date: 01/22/2021    History of Present Illness Pt is 80 yo female admitted with COVID and afib RVR. Hx of PE, DM, ILD, R LE DVT    PT Comments    Pt making good progress. She is very motivated and wants to increase her activity.  Pt incorporates extra movements into exercises safely for maximum benefit.  At arrival, pt had c/o earlier episode of dizziness/faintness.  Vitals monitored during session and progressed slowly - pt was able to tolerate moderate standing exercises.  Symptoms improved with exercises.  Did not ambulate away from bed due to earlier c/o dizziness, lines/leads, in COVID room, but did perform exercises in sitting and standing.     Follow Up Recommendations  Home health PT;Supervision - Intermittent     Equipment Recommendations  None recommended by PT    Recommendations for Other Services       Precautions / Restrictions Precautions Precautions: Fall Precaution Comments: monitor HR and BP    Mobility  Bed Mobility Overal bed mobility: Modified Independent Bed Mobility: Supine to Sit;Sit to Supine     Supine to sit: Modified independent (Device/Increase time);HOB elevated Sit to supine: Modified independent (Device/Increase time)   General bed mobility comments: Use of bed rail and assist to manage lines; did not require physical assist    Transfers Overall transfer level: Needs assistance Equipment used: Rolling walker (2 wheeled) Transfers: Sit to/from Stand Sit to Stand: Min guard         General transfer comment: Safe hand placement without cues; Min guard for safety and line/lead management; performed x 2  Ambulation/Gait Ambulation/Gait assistance: Min guard   Assistive device: Rolling walker (2 wheeled)       General Gait Details: Marched in place at good pace with good foot clearance 1 min x 2 with seated rest break.  Stayed  at EOB due to c/o dizziness earlier (have resolved), limited to room only due to COVID, and lines/leads to manage with frequent turns to ambulate in room could be unsafe if pt dizzy   Stairs             Wheelchair Mobility    Modified Rankin (Stroke Patients Only)       Balance Overall balance assessment: Needs assistance Sitting-balance support: No upper extremity supported Sitting balance-Leahy Scale: Good Sitting balance - Comments: Pt performing high knee flexion in bicycling motion with UE movement with stable balance   Standing balance support: Bilateral upper extremity supported;No upper extremity supported Standing balance-Leahy Scale: Fair Standing balance comment: Static stand no AD; RW for exercises                            Cognition Arousal/Alertness: Awake/alert Behavior During Therapy: WFL for tasks assessed/performed Overall Cognitive Status: Within Functional Limits for tasks assessed                                 General Comments: Pleasant and motivated      Exercises General Exercises - Lower Extremity Long Arc Quad: AROM;20 reps;Seated;Both Hip Flexion/Marching: AROM;Seated;Both (Pt incorporating trunk twist with shoulder retraction, high knees, and more of a bicycling motion.  Performed 20 reps x 2.) Heel Raises: AROM;Both;Standing (10x2 with min g and RW) Mini-Sqauts: AROM;10 reps;Standing (10x2 with min g and RW and cues for  correct form)    General Comments General comments (skin integrity, edema, etc.): Pt had c/o earlier episode of dizziness that she described as faintness and RN starting fluid bolus.  Pt still wanting to attempt therapy.  BP in supine was 104/68, transferred to EOB 115/71 - slight "faintness" but improved with exercises.  BP up to 120/70 with exercises and pt had no further c/o lightheadedness with sitting or standing.  HR was 90-120 throughout session.  O2 sats were at least 94%  on 2 L O2       Pertinent Vitals/Pain Pain Assessment: No/denies pain    Home Living                      Prior Function            PT Goals (current goals can now be found in the care plan section) Acute Rehab PT Goals Patient Stated Goal: to get better and get home PT Goal Formulation: With patient Time For Goal Achievement: 01/31/21 Potential to Achieve Goals: Good Progress towards PT goals: Progressing toward goals    Frequency    Min 3X/week      PT Plan Current plan remains appropriate    Co-evaluation              AM-PAC PT "6 Clicks" Mobility   Outcome Measure  Help needed turning from your back to your side while in a flat bed without using bedrails?: None Help needed moving from lying on your back to sitting on the side of a flat bed without using bedrails?: A Little Help needed moving to and from a bed to a chair (including a wheelchair)?: A Little Help needed standing up from a chair using your arms (e.g., wheelchair or bedside chair)?: A Little Help needed to walk in hospital room?: A Little Help needed climbing 3-5 steps with a railing? : A Little 6 Click Score: 19    End of Session Equipment Utilized During Treatment: Oxygen Activity Tolerance: Patient tolerated treatment well Patient left: in bed;with call bell/phone within reach Nurse Communication: Mobility status PT Visit Diagnosis: Unsteadiness on feet (R26.81);Difficulty in walking, not elsewhere classified (R26.2)     Time: 9450-3888 PT Time Calculation (min) (ACUTE ONLY): 34 min  Charges:  $Therapeutic Exercise: 8-22 mins $Therapeutic Activity: 8-22 mins                     Abran Richard, PT Acute Rehab Services Pager [phone] [facility] 01/22/2021, 4:32 PM

## 2021-01-23 DIAGNOSIS — R001 Bradycardia, unspecified: Secondary | ICD-10-CM | POA: Diagnosis not present

## 2021-01-23 DIAGNOSIS — U071 COVID-19: Secondary | ICD-10-CM | POA: Diagnosis not present

## 2021-01-23 DIAGNOSIS — J1282 Pneumonia due to coronavirus disease 2019: Secondary | ICD-10-CM | POA: Diagnosis not present

## 2021-01-23 DIAGNOSIS — I4891 Unspecified atrial fibrillation: Secondary | ICD-10-CM | POA: Diagnosis not present

## 2021-01-23 LAB — COMPREHENSIVE METABOLIC PANEL
ALT: 42 U/L (ref 0–44)
AST: 61 U/L — ABNORMAL HIGH (ref 15–41)
Albumin: 2.5 g/dL — ABNORMAL LOW (ref 3.5–5.0)
Alkaline Phosphatase: 56 U/L (ref 38–126)
Anion gap: 9 (ref 5–15)
BUN: 33 mg/dL — ABNORMAL HIGH (ref 8–23)
CO2: 25 mmol/L (ref 22–32)
Calcium: 8.3 mg/dL — ABNORMAL LOW (ref 8.9–10.3)
Chloride: 104 mmol/L (ref 98–111)
Creatinine, Ser: 0.91 mg/dL (ref 0.44–1.00)
GFR, Estimated: >60 mL/min (ref >60)
Glucose, Bld: 152 mg/dL — ABNORMAL HIGH (ref 70–99)
Potassium: 4.8 mmol/L (ref 3.5–5.1)
Sodium: 138 mmol/L (ref 135–145)
Total Bilirubin: 1 mg/dL (ref 0.3–1.2)
Total Protein: 5.9 g/dL — ABNORMAL LOW (ref 6.5–8.1)

## 2021-01-23 LAB — CBC WITH DIFFERENTIAL/PLATELET
Abs Immature Granulocytes: 0.12 10*3/uL — ABNORMAL HIGH (ref 0.00–0.07)
Basophils Absolute: 0 10*3/uL (ref 0.0–0.1)
Basophils Relative: 0 %
Eosinophils Absolute: 0 10*3/uL (ref 0.0–0.5)
Eosinophils Relative: 0 %
HCT: 47.3 % — ABNORMAL HIGH (ref 36.0–46.0)
Hemoglobin: 15.2 g/dL — ABNORMAL HIGH (ref 12.0–15.0)
Immature Granulocytes: 1 %
Lymphocytes Relative: 9 %
Lymphs Abs: 1.5 10*3/uL (ref 0.7–4.0)
MCH: 32.8 pg (ref 26.0–34.0)
MCHC: 32.1 g/dL (ref 30.0–36.0)
MCV: 101.9 fL — ABNORMAL HIGH (ref 80.0–100.0)
Monocytes Absolute: 1.2 10*3/uL — ABNORMAL HIGH (ref 0.1–1.0)
Monocytes Relative: 7 %
Neutro Abs: 14.6 10*3/uL — ABNORMAL HIGH (ref 1.7–7.7)
Neutrophils Relative %: 83 %
Platelets: 202 10*3/uL (ref 150–400)
RBC: 4.64 MIL/uL (ref 3.87–5.11)
RDW: 14.9 % (ref 11.5–15.5)
WBC: 17.5 10*3/uL — ABNORMAL HIGH (ref 4.0–10.5)
nRBC: 0.7 % — ABNORMAL HIGH (ref 0.0–0.2)

## 2021-01-23 LAB — MAGNESIUM: Magnesium: 2.5 mg/dL — ABNORMAL HIGH (ref 1.7–2.4)

## 2021-01-23 LAB — C-REACTIVE PROTEIN: CRP: 1.2 mg/dL — ABNORMAL HIGH (ref <1.0)

## 2021-01-23 LAB — GLUCOSE, CAPILLARY
Glucose-Capillary: 96 mg/dL (ref 70–99)
Glucose-Capillary: 109 mg/dL — ABNORMAL HIGH (ref 70–99)
Glucose-Capillary: 202 mg/dL — ABNORMAL HIGH (ref 70–99)
Glucose-Capillary: 177 mg/dL — ABNORMAL HIGH (ref 70–99)

## 2021-01-23 LAB — D-DIMER, QUANTITATIVE: D-Dimer, Quant: 1.37 ug/mL-FEU — ABNORMAL HIGH (ref 0.00–0.50)

## 2021-01-23 LAB — PHOSPHORUS: Phosphorus: 3.4 mg/dL (ref 2.5–4.6)

## 2021-01-23 LAB — FERRITIN: Ferritin: 1326 ng/mL — ABNORMAL HIGH (ref 11–307)

## 2021-01-23 MED ORDER — INSULIN GLARGINE 100 UNIT/ML ~~LOC~~ SOLN
15.0000 [IU] | Freq: Every day | SUBCUTANEOUS | Status: DC
  Administered 2021-01-23 – 2021-01-24 (×2): 15 [IU] via SUBCUTANEOUS
  Filled 2021-01-23 (×2): qty 0.15

## 2021-01-23 MED ORDER — SORBITOL 70 % SOLN
960.0000 mL | TOPICAL_OIL | Freq: Once | ORAL | Status: AC
  Administered 2021-01-23: 960 mL via RECTAL
  Filled 2021-01-23: qty 473

## 2021-01-23 MED ORDER — POLYETHYLENE GLYCOL 3350 17 G PO PACK
17.0000 g | PACK | Freq: Three times a day (TID) | ORAL | Status: DC
  Administered 2021-01-23 – 2021-01-27 (×12): 17 g via ORAL
  Filled 2021-01-23 (×12): qty 1

## 2021-01-23 NOTE — Progress Notes (Signed)
Progress Note   Subjective   Converted to sinus bradycardia overnight   Inpatient Medications    Scheduled Meds: . apixaban  5 mg Oral BID  . vitamin C  500 mg Oral Daily  . Chlorhexidine Gluconate Cloth  6 each Topical Daily  . insulin aspart  0-15 Units Subcutaneous TID WC  . insulin glargine  25 Units Subcutaneous Daily  . Ipratropium-Albuterol  1 puff Inhalation BID  . levothyroxine  75 mcg Oral QAC breakfast  . mouth rinse  15 mL Mouth Rinse BID  . polyethylene glycol  17 g Oral TID  . pravastatin  40 mg Oral q1800  . predniSONE  40 mg Oral Q breakfast   Followed by  . [START ON 01/25/2021] predniSONE  30 mg Oral Q breakfast   Followed by  . [START ON 01/27/2021] predniSONE  20 mg Oral Q breakfast   Followed by  . [START ON 01/29/2021] predniSONE  10 mg Oral Q breakfast  . senna-docusate  2 tablet Oral QHS  . sorbitol, milk of mag, mineral oil, glycerin (SMOG) enema  960 mL Rectal Once  . umeclidinium bromide  1 puff Inhalation Daily  . zinc sulfate  220 mg Oral Daily   Continuous Infusions:  PRN Meds: acetaminophen **OR** acetaminophen, bisacodyl, chlorpheniramine-HYDROcodone, guaiFENesin-dextromethorphan, nitroGLYCERIN, ondansetron **OR** ondansetron (ZOFRAN) IV, oxyCODONE   Vital Signs    Vitals:   01/23/21 0500 01/23/21 0600 01/23/21 0700 01/23/21 0800  BP: (!) 124/38 (!) 118/52 (!) 125/45   Pulse: (!) 43 (!) 52 (!) 47 (!) 49  Resp: _0 Temp:      TempSrc:      SpO2: 96% (!) 83% (!) 89% 93%  Weight:      Height:        Intake/Output Summary (Last 24 hours) at 01/23/2021 0828 Last data filed at 01/23/2021 0400 Gross per 24 hour  Intake 1109.5 ml  Output 225 ml  Net 884.5 ml   Filed Weights   01/16/21 1312  Weight: 80.7 kg    Telemetry    afib has converted to sinus bradycardia - Personally Reviewed  Physical Exam   GEN- The patient is less ill appearing, alert   Exam not performed due to covid 19   Labs    Chemistry Recent Labs   Lab 01/21/21 0242 01/22/21 0248 01/23/21 0239  NA 138 135 138  K 5.1 4.7 4.8  CL 106 100 104  CO2 19* 24 25  GLUCOSE 267* 348* 152*  BUN 23 29* 33*  CREATININE 0.87 0.90 0.91  CALCIUM 8.4* 8.0* 8.3*  PROT 6.3* 5.9* 5.9*  ALBUMIN 2.6* 2.5* 2.5*  AST 67* 42* 61*  ALT 45* 39 42  ALKPHOS 55 53 56  BILITOT 0.9 0.8 1.0  GFRNONAA >60 >60 >60  ANIONGAP _1 Hematology Recent Labs  Lab 01/21/21 0242 01/22/21 0248 01/23/21 0239  WBC 13.4* 15.3* 17.5*  RBC 4.66 4.73 4.64  HGB 15.1* 15.4* 15.2*  HCT 47.4* 47.4* 47.3*  MCV 101.7* 100.2* 101.9*  MCH 32.4 32.6 32.8  MCHC 31.9 32.5 32.1  RDW 14.8 14.6 14.9  PLT 203 216 202     Patient ID  [id number] y.o.femalewith a hx of HTN, HLD, DM type 2, hypothyroidism, current tobacco use, vitamin D deficiency, thrombocytopenia, mildly +DS DNA ab, 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  Assessment & Plan    1.  afib with RVR Has converted to sinus rhythm Amiodarone drip has been discontinued Consider starting amiodarone 225m po daily tomorrow if HR > 60 bpm Continue eliquis Discontinue metoprolol She has severe biatrial enlargement I will arrange AF clinic follow-up  2. Sinus bradycardia Discontinue metoprolol Hold amiodarone today (as above)  3. HTN Stable No change required today  4. PTE PASP has improved by repeat echo  5. Tachycardia mediated CM Echo reveals interval reduction in EF (45%) This is likely tachycardia mediated and should recover Would repeat echo in 3 months Could add losartan as BP allows  6. Elevated troponin Not an MI I will stop ASA today as she is on eliquis  7. COVID 19 pneumonia improving  JThompson GrayerMD, FKerrville State Hospital2/13/2022 8:28 AM

## 2021-01-23 NOTE — Progress Notes (Addendum)
PROGRESS NOTE    Elizabeth Melton  ZCH:[id number] DOB: 05/14/1941 DOA: 01/16/2021 PCP: Kathyrn Lass, MD  Chief Complaint  Patient presents with  . Shortness of Breath  . Dizziness    Brief Narrative: Elizabeth Melton 80 y.o.femalewith medical history significant ofILD(ANA positive)who was previously on nocturnal oxygen but has been off for some time, history of saddle PE recently stopped Eliquis, essential hypertension, hypothyroidism, hyperlipidemia, type 2 diabetes mellitus who presented to the ED with progressive shortness of breath and intermittent dizziness over the past week. Patient initially thought it was related to her Eliquis, she contacted her pulmonologist who elected to have patient stop her anticoagulant. Patient symptoms continue to progress now associated with decreased appetite, myalgias.Patient denies any sick contacts, no recent travel and no other changes in medication regimen. Patient specifically denies visual changes, no chest pain, no palpitations, no nausea/vomiting/diarrhea, no abdominal pain, no chills/night sweats, no paresthesias.  She was admitted for acute hypoxic respiratory failure 2/2 COVID 19 pneumonia.   Assessment & Plan:   Principal Problem:   Pneumonia due to COVID-19 virus Active Problems:   Hypothyroidism   Hyperlipidemia   Diabetes mellitus without complication ([place])   Essential (primary) hypertension   ILD (interstitial lung disease) ([place])   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HCC)   Acute respiratory failure with hypoxia (HCC)   Atrial fibrillation with RVR (HCC)   Demand ischemia (HCC)  Atrial Fibrillation with RVR Converted to sinus brady yesterday (2/12) afternoon Holding amiodarone and metoprolol at this time PCCM c/s 2/9 in case of need for cardioversion with hypotension - now signed off TSH wnl 2/6.  Echo with grade II diastolic dysfunction.  Severely elevated PASP.   Repeat echo 2/12 with EF 40-45%, mildly  reduced RVSF, global hypokinesis chadvasc at least 6, she's already on eliquis, continue Cardiology consult, appreciate assistance  Elevated Troponin   NSTEMI vs Demand Ischemia in setting of afib with RVR No CP or SOB.  She did c/o intermittent L flank pain yesterday, resolved today. Initial EKG yesterday with T wave inversion II, III, aVF, ST depression V3-V6, thought to be rate related changes.  Repeat EKG appeared improved.   Troponin peaked to 1442 D/c aspirin.  On anticoagulation with eliquis. 2/12 echo with decreased EF 40-45%, mildly reduce RVSF (see report) per cards Cardiology c/s, appreciate recs A1c 7.6, ldl 62  Lightheadedness: sx resolved without intervention.  BP didn't appear low.  EKG with RVR.  No vertigo.  Exam without focal neurologic deficit.  Unclear what caused symptoms, ? RVR vs relative hypotension.   Seems improved, continue to monitor  Acute hypoxic respiratory failure secondary to acute Covid-19 viral pneumonia during the ongoing Covid 19 Pandemic - POA Progressive shortness of breath with associated myalgias and decreased appetite over the last week. Patient initially thought this was related to her home Eliquis, which she stopped taking on 01/13/2021 after discussion with her pulmonologist. Patient is unvaccinated against Covid-19.  -- Currently on 2 L [place] (adjust as tolerated) -- CT PE protocol 2/6 without evidence of acute PE, superimposed covid 19 infection on Elizabeth Melton background of ILD, mediastinal and hilar adenopathy (likely reactive) --COVID test:+ 01/16/2021 --Remdesivir, plan 5-day course (Day#5/5) --Continue Solumedrolto 30m IV q12h - taper -- I/O, daily weights -- prone as able, OOB, IS, flutter  COVID-19 Labs  Recent Labs    01/21/21 0242 01/22/21 0248 01/23/21 0239  DDIMER 1.76* 1.16* 1.37*  CRP 0.7 0.7  --     Lab Results  Component Value Date   SARSCOV2NAA POSITIVE (Brea Coleson) 01/16/2021   [place] NEGATIVE 09/05/2020   [place] NEGATIVE  03/13/2020   SARSCOV2NAA NOT DETECTED 10/20/2019   Abdominal Discomfort  Constipation: bowel regimen, enema  Recent saddle pulmonary embolism Patient with recent large volume thrombus including Theresa Dohrman thin saddle PE with associated right heart strain on CTA 09/05/2020. Patient was started on IV heparin at that time and PCCM and IR consulted for TPA consideration but patient declined. Patient was on IV heparin for 48 hours and transition to Eliquis. Patient recently stopped her Eliquis after contacting her pulmonology office as thought her current shortness of breath related to Eliquis use. CT angiogram chest negative for PE. --Continue home Eliquis  T2DM: A1c 7.6. doesn't appear she's on home meds. SSI, basal  Adjust prn  Elevated LFTs: possibly related to covid vs hypotension yesterday.  Continue to follow  Essential hypertension Currently off antihypertensives.   Hyperlipidemia On lovastatin 40 mg p.o. daily at home, will continue with pravastatin hospital substitution while inpatient  Interstitial lung disease History of pulmonary nodules Patient follows with pulmonology, Dr. [name] outpatient. History of ANA positive. Was previously on nocturnal oxygen, but has been off for some time. CT high-resolution chest 08/28/2018 with scattered solid pulmonary nodules, largest 7 mm basilar right upper lobe which have been stable. --Continue Combivent inhaler as above --Continue supplemental oxygen, titrate to maintain SPO2 greater than 88% --Outpatient follow-up with pulmonology on discharge  DVT prophylaxis: eliquis Code Status: full Family Communication: none at bedside - daughter 2/13 over phone Disposition:   Status is: Inpatient  Remains inpatient appropriate because:Inpatient level of care appropriate due to severity of illness   Dispo: The patient is from: Home              Anticipated d/c is to: Home              Anticipated d/c date is: > 3 days               Patient currently is not medically stable to d/c.   Difficult to place patient No  Consultants:   cardiology  Procedures:  Echo IMPRESSIONS    1. Left ventricular ejection fraction, by estimation, is 60 to 65%. The  left ventricle has normal function. The left ventricle has no regional  wall motion abnormalities. There is mild left ventricular hypertrophy.  Left ventricular diastolic parameters  are consistent with Grade II diastolic dysfunction (pseudonormalization).  Elevated left ventricular end-diastolic pressure. The E/e' is 24.  2. Right ventricular systolic function is normal. The right ventricular  size is normal. There is severely elevated pulmonary artery systolic  pressure. The estimated right ventricular systolic pressure is 16.1 mmHg.  3. Left atrial size was severely dilated.  4. Right atrial size was mildly dilated.  5. The mitral valve is grossly normal. Trivial mitral valve  regurgitation.  6. The aortic valve is tricuspid. Aortic valve regurgitation is mild.  Mild aortic valve stenosis. Aortic valve area, by VTI measures 1.71 cm.  Aortic valve mean gradient measures 8.0 mmHg. Aortic valve Vmax measures  2.17 m/s.  7. The inferior vena cava is normal in size with greater than 50%  respiratory variability, suggesting right atrial pressure of 3 mmHg.   Comparison(s): Changes from prior study are noted. 09/06/20: LVEF 60-65%,  RVSP 70 mmHG, severely dilated and hypokinetic RV. In my review of the  previous study, the RV does not appear significantly dilated or  hypokinetic.   Antimicrobials: Anti-infectives (From  admission, onward)   Start     Dose/Rate Route Frequency Ordered Stop   01/17/21 1000  remdesivir 100 mg in sodium chloride 0.9 % 100 mL IVPB       "Followed by" Linked Group Details   100 mg 200 mL/hr over 30 Minutes Intravenous Daily 01/16/21 1614 01/20/21 1135   01/16/21 1700  remdesivir 200 mg in sodium chloride 0.9% 250 mL IVPB        "Followed by" Linked Group Details   200 mg 580 mL/hr over 30 Minutes Intravenous Once 01/16/21 1614 01/16/21 1750     Subjective: Uncomfortable with constipation   Objective: Vitals:   01/23/21 0500 01/23/21 0600 01/23/21 0700 01/23/21 0800  BP: (!) 124/38 (!) 118/52 (!) 125/45   Pulse: (!) 43 (!) 52 (!) 47 (!) 49  Resp: _0 Temp:      TempSrc:      SpO2: 96% (!) 83% (!) 89% 93%  Weight:      Height:        Intake/Output Summary (Last 24 hours) at 01/23/2021 0825 Last data filed at 01/23/2021 0400 Gross per 24 hour  Intake 1109.5 ml  Output 225 ml  Net 884.5 ml   Filed Weights   01/16/21 1312  Weight: 80.7 kg    Examination:  General: No acute distress. Cardiovascular: brady, regular rate Lungs: Clear to auscultation bilaterally Abdomen: Soft, nontender, nondistended Neurological: Alert and oriented 3. Moves all extremities 4. Cranial nerves II through XII grossly intact. Skin: Warm and dry. No rashes or lesions. Extremities: No clubbing or cyanosis. No edema.   Data Reviewed: I have personally reviewed following labs and imaging studies  CBC: Recent Labs  Lab 01/19/21 0410 01/20/21 0239 01/21/21 0242 01/22/21 0248 01/23/21 0239  WBC 12.5* 12.4* 13.4* 15.3* 17.5*  NEUTROABS 10.4* 10.4* 11.5* 13.5* 14.6*  HGB 15.1* 15.4* 15.1* 15.4* 15.2*  HCT 46.5* 46.3* 47.4* 47.4* 47.3*  MCV 99.8 97.9 101.7* 100.2* 101.9*  PLT 161 161 203 216 017    Basic Metabolic Panel: Recent Labs  Lab 01/17/21 0319 01/18/21 0401 01/19/21 2004 01/20/21 0239 01/21/21 0242 01/22/21 0248 01/23/21 0239  NA 137   < > 141 136 138 135 138  K 4.0   < > 4.5 4.7 5.1 4.7 4.8  CL 102   < > 109 103 106 100 104  CO2 25   < > 23 22 19* 24 25  GLUCOSE 230*   < > 113* 273* 267* 348* 152*  BUN 16   < > 32* 32* 23 29* 33*  CREATININE 0.74   < > 1.12* 0.94 0.87 0.90 0.91  CALCIUM 7.9*   < > 8.4* 8.2* 8.4* 8.0* 8.3*  MG 2.2  --  2.4 2.3  --  2.4 2.5*  PHOS  --   --   --  3.0   --  2.8 3.4   < > = values in this interval not displayed.    GFR: Estimated Creatinine Clearance: 54.6 mL/min (by C-G formula based on SCr of 0.91 mg/dL).  Liver Function Tests: Recent Labs  Lab 01/19/21 0410 01/20/21 0239 01/21/21 0242 01/22/21 0248 01/23/21 0239  AST 54* 91* 67* 42* 61*  ALT 32 48* 45* 39 42  ALKPHOS 50 53 55 53 56  BILITOT 0.8 0.8 0.9 0.8 1.0  PROT 6.4* 6.2* 6.3* 5.9* 5.9*  ALBUMIN 2.6* 2.7* 2.6* 2.5* 2.5*    CBG: Recent Labs  Lab 01/22/21 1205 01/22/21 1403 01/22/21 1627  01/22/21 2139 01/23/21 0818  GLUCAP 241* 221* 212* 209* 96     Recent Results (from the past 240 hour(s))  SARS Coronavirus 2 by RT PCR (hospital order, performed in [facility] hospital lab) Nasopharyngeal Nasopharyngeal Swab     Status: Abnormal   Collection Time: 01/16/21  1:56 PM   Specimen: Nasopharyngeal Swab  Result Value Ref Range Status   SARS Coronavirus 2 POSITIVE (Keidan Aumiller) NEGATIVE Final    Comment: RESULT CALLED TO, READ BACK BY AND VERIFIED WITH: RIVERS,TJ AT 1552 ON 01/16/2021 BY JPM (NOTE) SARS-CoV-2 target nucleic acids are DETECTED  SARS-CoV-2 RNA is generally detectable in upper respiratory specimens  during the acute phase of infection.  Positive results are indicative  of the presence of the identified virus, but do not rule out bacterial infection or co-infection with other pathogens not detected by the test.  Clinical correlation with patient history and  other diagnostic information is necessary to determine patient infection status.  The expected result is negative.  Fact Sheet for Patients:   StrictlyIdeas.no   Fact Sheet for Healthcare Providers:   BankingDealers.co.za    This test is not yet approved or cleared by the [place] FDA and  has been authorized for detection and/or diagnosis of SARS-CoV-2 by FDA under an Emergency Use Authorization (EUA).  This EUA will remain in effect (meaning this   test can be used) for the duration of  the COVID-19 declaration under Section 564(b)(1) of the Act, 21 U.S.C. section 360-bbb-3(b)(1), unless the authorization is terminated or revoked sooner.  Performed at [facility], [place] [address], [place], [place] [address]   Blood Culture (routine x 2)     Status: None   Collection Time: 01/16/21  2:01 PM   Specimen: BLOOD  Result Value Ref Range Status   Specimen Description   Final    BLOOD RIGHT ANTECUBITAL Performed at [facility] [address] [place], [place] [address]    Special Requests   Final    BOTTLES DRAWN AEROBIC AND ANAEROBIC Blood Culture adequate volume Performed at [facility] [address] [place], [place] [address]    Culture   Final    NO GROWTH 5 DAYS Performed at [facility], [place] [address], [place], [place] [address]    Report Status 01/21/2021 FINAL  Final  Blood Culture (routine x 2)     Status: None   Collection Time: 01/16/21  2:30 PM   Specimen: BLOOD  Result Value Ref Range Status   Specimen Description   Final    BLOOD LEFT ANTECUBITAL BOTTLES DRAWN AEROBIC AND ANAEROBIC Blood Culture adequate volume Performed at [facility] [address], [place], [place] [address]    Special Requests   Final    BOTTLES DRAWN AEROBIC AND ANAEROBIC Blood Culture adequate volume   Culture   Final    NO GROWTH 5 DAYS Performed at [facility], [place] [address] [place], [place] [address]    Report Status 01/21/2021 FINAL  Final  MRSA PCR Screening     Status: None   Collection Time: 01/19/21  6:58 PM   Specimen: Nasal Mucosa; Nasopharyngeal  Result Value Ref Range Status   MRSA by PCR NEGATIVE NEGATIVE Final    Comment:        The GeneXpert MRSA Assay (FDA approved for NASAL specimens only), is one component of Elizabeth Melton comprehensive MRSA colonization surveillance program. It is not intended to  diagnose MRSA infection nor  to guide or monitor treatment for MRSA infections. Performed at [facility], [place], [place] [address], [place], [place] [address]          Radiology Studies: ECHOCARDIOGRAM LIMITED  Result Date: 01/22/2021    ECHOCARDIOGRAM LIMITED REPORT   Patient Name:   Elizabeth Melton Date of Exam: 01/22/2021 Medical Rec #:  [id number]          Height:       66.0 in Accession #:    [id number]         Weight:       178.0 lb Date of Birth:  [birth year]          BSA:          1.903 m Patient Age:    85 years           BP:           98/78 mmHg Patient Gender: F                  HR:           121 bpm. Exam Location:  Inpatient Procedure: Cardiac Doppler, Color Doppler, Intracardiac Opacification Agent,            Limited Echo and 3D Echo Indications:    R07.9* Chest pain, unspecified. Elevated troponin.  History:        Patient has prior history of Echocardiogram examinations.                 Abnormal ECG, Aortic Valve Disease, Arrythmias:Atrial                 Fibrillation, Signs/Symptoms:Dyspnea and Shortness of Breath;                 Risk Factors:Hypertension, Diabetes and Current Smoker. Covid                 positive. Pulmonary embolus. Demand ischemia.  Sonographer:    Roseanna Rainbow RDCS Referring Phys: [phone] Pranit Owensby CALDWELL [place]  1. Left ventricular ejection fraction, by estimation, is 40 to 45%. The left ventricle has mildly decreased function. The left ventricle demonstrates global hypokinesis. There is mild concentric left ventricular hypertrophy. Diastolic function is indeterminant due to Afib.  2. Right ventricular systolic function is mildly reduced. The right ventricular size is normal. There is mildly elevated pulmonary artery systolic pressure.  3. Left atrial size was severely dilated.  4. Right atrial size was severely dilated.  5. The mitral valve is abnormal. Moderate mitral valve regurgitation. Moderate mitral annular calcification.  6. Tricuspid valve regurgitation is mild to  moderate.  7. The aortic valve is tricuspid. There is moderate calcification of the aortic valve. There is moderate thickening of the aortic valve. Aortic valve regurgitation is moderate. Mild aortic valve stenosis.  8. The inferior vena cava is dilated in size with <50% respiratory variability, suggesting right atrial pressure of 15 mmHg. Comparison(s): Compared to prior TTE, the LVEF appears depressed to 40-45%, there is now moderate MR and AI. Patient is also in Afib currently with rapid ventricular response. FINDINGS  Left Ventricle: Left ventricular ejection fraction, by estimation, is 40 to 45%. The left ventricle has mildly decreased function. The left ventricle demonstrates global hypokinesis. Definity contrast agent was given IV to delineate the left ventricular  endocardial borders. The left ventricular internal cavity size was normal in size. There is mild concentric left ventricular hypertrophy. Diastolic function is  indeterminant due to Afib. Right Ventricle: The right ventricular size is normal. Right ventricular systolic function is mildly reduced. There is mildly elevated pulmonary artery systolic pressure. The tricuspid regurgitant velocity is 2.76 m/s, and with an assumed right atrial pressure of 8 mmHg, the estimated right ventricular systolic pressure is 65.7 mmHg. Left Atrium: Left atrial size was severely dilated. Right Atrium: Right atrial size was severely dilated. Mitral Valve: The mitral valve is abnormal. There is moderate thickening of the mitral valve leaflet(s). There is moderate calcification of the mitral valve leaflet(s). Moderate mitral annular calcification. Moderate mitral valve regurgitation. Tricuspid Valve: The tricuspid valve is normal in structure. Tricuspid valve regurgitation is mild to moderate. Aortic Valve: The aortic valve is tricuspid. There is moderate calcification of the aortic valve. There is moderate thickening of the aortic valve. Aortic valve regurgitation is  moderate. Aortic regurgitation PHT measures 335 msec. Mild aortic stenosis is present. Pulmonic Valve: The pulmonic valve was not well visualized. Aorta: The aortic root is normal in size and structure. Venous: The inferior vena cava is dilated in size with less than 50% respiratory variability, suggesting right atrial pressure of 15 mmHg. LEFT VENTRICLE PLAX 2D LVIDd:         3.70 cm LVIDs:         2.80 cm LV PW:         1.50 cm LV IVS:        1.30 cm LVOT diam:     1.70 cm LV SV:         46 LV SV Index:   24 LVOT Area:     2.27 cm  LV Volumes (MOD) LV vol d, MOD A2C: 47.8 ml LV vol d, MOD A4C: 65.3 ml LV vol s, MOD A2C: 31.2 ml LV vol s, MOD A4C: 33.0 ml LV SV MOD A2C:     16.6 ml LV SV MOD A4C:     65.3 ml LV SV MOD BP:      24.0 ml IVC IVC diam: 2.30 cm LEFT ATRIUM         Index LA diam:    4.50 cm 2.36 cm/m  AORTIC VALVE LVOT Vmax:   117.00 cm/s LVOT Vmean:  85.100 cm/s LVOT VTI:    0.202 m AI PHT:      335 msec  AORTA Ao Root diam: 3.10 cm MR Peak grad:    87.6 mmHg   TRICUSPID VALVE MR Mean grad:    57.0 mmHg   TR Peak grad:   30.5 mmHg MR Vmax:         468.00 cm/s TR Vmax:        276.00 cm/s MR Vmean:        361.0 cm/s MR PISA:         1.01 cm    SHUNTS MR PISA Eff ROA: 8 mm       Systemic VTI:  0.20 m MR PISA Radius:  0.40 cm     Systemic Diam: 1.70 cm Gwyndolyn Kaufman MD Electronically signed by Gwyndolyn Kaufman MD Signature Date/Time: 01/22/2021/1:09:48 PM    Final         Scheduled Meds: . apixaban  5 mg Oral BID  . vitamin C  500 mg Oral Daily  . aspirin  81 mg Oral Daily  . Chlorhexidine Gluconate Cloth  6 each Topical Daily  . insulin aspart  0-15 Units Subcutaneous TID WC  . insulin glargine  25 Units Subcutaneous Daily  . Ipratropium-Albuterol  1 puff Inhalation BID  . levothyroxine  75 mcg Oral QAC breakfast  . mouth rinse  15 mL Mouth Rinse BID  . metoprolol tartrate  25 mg Oral Q6H  . polyethylene glycol  17 g Oral TID  . pravastatin  40 mg Oral q1800  . predniSONE  40 mg  Oral Q breakfast   Followed by  . [START ON 01/25/2021] predniSONE  30 mg Oral Q breakfast   Followed by  . [START ON 01/27/2021] predniSONE  20 mg Oral Q breakfast   Followed by  . [START ON 01/29/2021] predniSONE  10 mg Oral Q breakfast  . senna-docusate  2 tablet Oral QHS  . sorbitol, milk of mag, mineral oil, glycerin (SMOG) enema  960 mL Rectal Once  . umeclidinium bromide  1 puff Inhalation Daily  . zinc sulfate  220 mg Oral Daily   Continuous Infusions:    LOS: 7 days    Time spent: over 30 min    Fayrene Helper, MD [facility]   To contact the attending provider between 7A-7P or the covering provider during after hours 7P-7A, please log into the web site [email or web address] and access using universal [place] password for that web site. If you do not have the password, please call the hospital operator.  01/23/2021, 8:25 AM

## 2021-01-24 ENCOUNTER — Inpatient Hospital Stay (HOSPITAL_COMMUNITY): Payer: Medicare HMO

## 2021-01-24 DIAGNOSIS — J1282 Pneumonia due to coronavirus disease 2019: Secondary | ICD-10-CM | POA: Diagnosis not present

## 2021-01-24 DIAGNOSIS — U071 COVID-19: Secondary | ICD-10-CM | POA: Diagnosis not present

## 2021-01-24 LAB — COMPREHENSIVE METABOLIC PANEL
ALT: 64 U/L — ABNORMAL HIGH (ref 0–44)
AST: 148 U/L — ABNORMAL HIGH (ref 15–41)
Albumin: 2.4 g/dL — ABNORMAL LOW (ref 3.5–5.0)
Alkaline Phosphatase: 51 U/L (ref 38–126)
Anion gap: 8 (ref 5–15)
BUN: 27 mg/dL — ABNORMAL HIGH (ref 8–23)
CO2: 27 mmol/L (ref 22–32)
Calcium: 8.3 mg/dL — ABNORMAL LOW (ref 8.9–10.3)
Chloride: 105 mmol/L (ref 98–111)
Creatinine, Ser: 0.78 mg/dL (ref 0.44–1.00)
GFR, Estimated: >60 mL/min (ref >60)
Glucose, Bld: 80 mg/dL (ref 70–99)
Potassium: 4.6 mmol/L (ref 3.5–5.1)
Sodium: 140 mmol/L (ref 135–145)
Total Bilirubin: 1.6 mg/dL — ABNORMAL HIGH (ref 0.3–1.2)
Total Protein: 5.8 g/dL — ABNORMAL LOW (ref 6.5–8.1)

## 2021-01-24 LAB — CBC WITH DIFFERENTIAL/PLATELET
Abs Immature Granulocytes: 0.11 10*3/uL — ABNORMAL HIGH (ref 0.00–0.07)
Basophils Absolute: 0 10*3/uL (ref 0.0–0.1)
Basophils Relative: 0 %
Eosinophils Absolute: 0 10*3/uL (ref 0.0–0.5)
Eosinophils Relative: 0 %
HCT: 46.3 % — ABNORMAL HIGH (ref 36.0–46.0)
Hemoglobin: 15.2 g/dL — ABNORMAL HIGH (ref 12.0–15.0)
Immature Granulocytes: 1 %
Lymphocytes Relative: 14 %
Lymphs Abs: 1.6 10*3/uL (ref 0.7–4.0)
MCH: 32.5 pg (ref 26.0–34.0)
MCHC: 32.8 g/dL (ref 30.0–36.0)
MCV: 99.1 fL (ref 80.0–100.0)
Monocytes Absolute: 0.8 10*3/uL (ref 0.1–1.0)
Monocytes Relative: 7 %
Neutro Abs: 9 10*3/uL — ABNORMAL HIGH (ref 1.7–7.7)
Neutrophils Relative %: 78 %
Platelets: 183 10*3/uL (ref 150–400)
RBC: 4.67 MIL/uL (ref 3.87–5.11)
RDW: 14.7 % (ref 11.5–15.5)
WBC: 11.5 10*3/uL — ABNORMAL HIGH (ref 4.0–10.5)
nRBC: 0.6 % — ABNORMAL HIGH (ref 0.0–0.2)

## 2021-01-24 LAB — GLUCOSE, CAPILLARY
Glucose-Capillary: 103 mg/dL — ABNORMAL HIGH (ref 70–99)
Glucose-Capillary: 67 mg/dL — ABNORMAL LOW (ref 70–99)
Glucose-Capillary: 166 mg/dL — ABNORMAL HIGH (ref 70–99)
Glucose-Capillary: 183 mg/dL — ABNORMAL HIGH (ref 70–99)
Glucose-Capillary: 192 mg/dL — ABNORMAL HIGH (ref 70–99)

## 2021-01-24 LAB — MAGNESIUM: Magnesium: 2.2 mg/dL (ref 1.7–2.4)

## 2021-01-24 LAB — HEPATITIS PANEL, ACUTE
HCV Ab: NONREACTIVE
Hep A IgM: NONREACTIVE
Hep B C IgM: NONREACTIVE
Hepatitis B Surface Ag: NONREACTIVE

## 2021-01-24 LAB — FERRITIN: Ferritin: 1652 ng/mL — ABNORMAL HIGH (ref 11–307)

## 2021-01-24 LAB — D-DIMER, QUANTITATIVE: D-Dimer, Quant: 1.1 ug/mL-FEU — ABNORMAL HIGH (ref 0.00–0.50)

## 2021-01-24 LAB — C-REACTIVE PROTEIN: CRP: 2.2 mg/dL — ABNORMAL HIGH (ref <1.0)

## 2021-01-24 LAB — PHOSPHORUS: Phosphorus: 3.3 mg/dL (ref 2.5–4.6)

## 2021-01-24 LAB — BRAIN NATRIURETIC PEPTIDE: B Natriuretic Peptide: 137.5 pg/mL — ABNORMAL HIGH (ref 0.0–100.0)

## 2021-01-24 MED ORDER — SIMETHICONE 80 MG PO CHEW: 80.0000 mg | CHEWABLE_TABLET | Freq: Four times a day (QID) | ORAL | Status: DC | PRN

## 2021-01-24 MED ORDER — SORBITOL 70 % SOLN
960.0000 mL | TOPICAL_OIL | Freq: Once | ORAL | Status: DC
  Filled 2021-01-24: qty 473

## 2021-01-24 MED ORDER — INSULIN GLARGINE 100 UNIT/ML ~~LOC~~ SOLN: 10.0000 [IU] | Freq: Every day | SUBCUTANEOUS | Status: DC

## 2021-01-24 MED ORDER — IPRATROPIUM-ALBUTEROL 0.5-2.5 (3) MG/3ML IN SOLN
RESPIRATORY_TRACT | Status: AC
  Filled 2021-01-24: qty 3

## 2021-01-24 MED ORDER — LOSARTAN POTASSIUM 25 MG PO TABS
12.5000 mg | ORAL_TABLET | Freq: Every day | ORAL | Status: DC
  Administered 2021-01-24 – 2021-01-27 (×4): 12.5 mg via ORAL
  Filled 2021-01-24 (×3): qty 1
  Filled 2021-01-24: qty 0.5

## 2021-01-24 MED ORDER — AMIODARONE HCL 200 MG PO TABS
200.0000 mg | ORAL_TABLET | Freq: Every day | ORAL | Status: DC
  Administered 2021-01-24 – 2021-01-25 (×2): 200 mg via ORAL
  Filled 2021-01-24 (×2): qty 1

## 2021-01-24 NOTE — Progress Notes (Signed)
Cardiology Progress Note  Patient ID: Elizabeth Melton MRN: [id number] DOB: [birth year] Date of Encounter: 01/24/2021  Primary Cardiologist: Ena Dawley, MD  Subjective   Chief Complaint: Bloating.  Fatigue.  HPI: Maintaining sinus rhythm.  Heart rate in 60s.  ROS:  All other ROS reviewed and negative. Pertinent positives noted in the HPI.     Inpatient Medications  Scheduled Meds: . apixaban  5 mg Oral BID  . vitamin C  500 mg Oral Daily  . Chlorhexidine Gluconate Cloth  6 each Topical Daily  . insulin aspart  0-15 Units Subcutaneous TID WC  . Ipratropium-Albuterol  1 puff Inhalation BID  . ipratropium-albuterol      . levothyroxine  75 mcg Oral QAC breakfast  . mouth rinse  15 mL Mouth Rinse BID  . polyethylene glycol  17 g Oral TID  . [START ON 01/25/2021] predniSONE  30 mg Oral Q breakfast   Followed by  . [START ON 01/27/2021] predniSONE  20 mg Oral Q breakfast   Followed by  . [START ON 01/29/2021] predniSONE  10 mg Oral Q breakfast  . senna-docusate  2 tablet Oral QHS  . sorbitol, milk of mag, mineral oil, glycerin (SMOG) enema  960 mL Rectal Once  . umeclidinium bromide  1 puff Inhalation Daily  . zinc sulfate  220 mg Oral Daily   Continuous Infusions:  PRN Meds: bisacodyl, chlorpheniramine-HYDROcodone, guaiFENesin-dextromethorphan, nitroGLYCERIN, ondansetron **OR** ondansetron (ZOFRAN) IV, oxyCODONE, simethicone   Vital Signs   Vitals:   01/24/21 0600 01/24/21 0800 01/24/21 0900 01/24/21 1232  BP: (!) 138/47 (!) 136/42    Pulse: (!) 57 (!) 52  63  Resp: _0 Temp:  97.7 F (36.5 C)    TempSrc:  Oral    SpO2: (!) 89% (!) 89% 91% 92%  Weight:      Height:        Intake/Output Summary (Last 24 hours) at 01/24/2021 1242 Last data filed at 01/24/2021 0600 Gross per 24 hour  Intake --  Output 700 ml  Net -700 ml   Last 3 Weights 01/16/2021 11/22/2020 09/29/2020  Weight (lbs) 178 lb 177 lb 6.4 oz 171 lb 9.6 oz  Weight (kg) 80.74 kg 80.468 kg  77.837 kg      Telemetry  Overnight telemetry shows sinus rhythm heart rate 60, which I personally reviewed.   ECG  The most recent ECG shows sinus bradycardia heart rate 48, which I personally reviewed.   Physical Exam   Vitals:   01/24/21 0600 01/24/21 0800 01/24/21 0900 01/24/21 1232  BP: (!) 138/47 (!) 136/42    Pulse: (!) 57 (!) 52  63  Resp: _1 Temp:  97.7 F (36.5 C)    TempSrc:  Oral    SpO2: (!) 89% (!) 89% 91% 92%  Weight:      Height:         Intake/Output Summary (Last 24 hours) at 01/24/2021 1242 Last data filed at 01/24/2021 0600 Gross per 24 hour  Intake --  Output 700 ml  Net -700 ml    Last 3 Weights 01/16/2021 11/22/2020 09/29/2020  Weight (lbs) 178 lb 177 lb 6.4 oz 171 lb 9.6 oz  Weight (kg) 80.74 kg 80.468 kg 77.837 kg    Body mass index is 28.73 kg/m.   General: Well nourished, well developed, in no acute distress Head: Atraumatic, normal size  Eyes: PEERLA, EOMI  Neck: Supple, JVD 5 to 7 cm water Endocrine: No  thryomegaly Cardiac: Normal S1, S2; RRR; no murmurs, rubs, or gallops Lungs:  diminished breath bilaterally Abd: Soft, nontender, no hepatomegaly  Ext: No edema, pulses 2+ Musculoskeletal: No deformities, BUE and BLE strength normal and equal Skin: Warm and dry, no rashes   Neuro: Alert and oriented to person, place, time, and situation, CNII-XII grossly intact, no focal deficits  Psych: Normal mood and affect   Labs  High Sensitivity Troponin:   Recent Labs  Lab 01/16/21 1608 01/19/21 1816 01/19/21 2004 01/20/21 0727 01/20/21 1636  TROPONINIHS 13 400* 746* 1,442* 1,209*     Cardiac EnzymesNo results for input(s): TROPONINI in the last 168 hours. No results for input(s): TROPIPOC in the last 168 hours.  Chemistry Recent Labs  Lab 01/22/21 0248 01/23/21 0239 01/24/21 0328  NA 135 138 140  K 4.7 4.8 4.6  CL 100 104 105  CO2 _0 GLUCOSE 348* 152* 80  BUN 29* 33* 27*  CREATININE 0.90 0.91 0.78  CALCIUM 8.0*  8.3* 8.3*  PROT 5.9* 5.9* 5.8*  ALBUMIN 2.5* 2.5* 2.4*  AST 42* 61* 148*  ALT 39 42 64*  ALKPHOS 53 56 51  BILITOT 0.8 1.0 1.6*  GFRNONAA >60 >60 >60  ANIONGAP _1 Hematology Recent Labs  Lab 01/22/21 0248 01/23/21 0239 01/24/21 0328  WBC 15.3* 17.5* 11.5*  RBC 4.73 4.64 4.67  HGB 15.4* 15.2* 15.2*  HCT 47.4* 47.3* 46.3*  MCV 100.2* 101.9* 99.1  MCH 32.6 32.8 32.5  MCHC 32.5 32.1 32.8  RDW 14.6 14.9 14.7  PLT 216 202 183   BNPNo results for input(s): BNP, PROBNP in the last 168 hours.  DDimer  Recent Labs  Lab 01/22/21 0248 01/23/21 0239 01/24/21 0328  DDIMER 1.16* 1.37* 1.10*     Radiology  No results found.  Cardiac Studies  TTE 01/22/2021 1. Left ventricular ejection fraction, by estimation, is 40 to 45%. The  left ventricle has mildly decreased function. The left ventricle  demonstrates global hypokinesis. There is mild concentric left ventricular  hypertrophy. Diastolic function is  indeterminant due to Afib.  2. Right ventricular systolic function is mildly reduced. The right  ventricular size is normal. There is mildly elevated pulmonary artery  systolic pressure.  3. Left atrial size was severely dilated.  4. Right atrial size was severely dilated.  5. The mitral valve is abnormal. Moderate mitral valve regurgitation.  Moderate mitral annular calcification.  6. Tricuspid valve regurgitation is mild to moderate.  7. The aortic valve is tricuspid. There is moderate calcification of the  aortic valve. There is moderate thickening of the aortic valve. Aortic  valve regurgitation is moderate. Mild aortic valve stenosis.  8. The inferior vena cava is dilated in size with <50% respiratory  variability, suggesting right atrial pressure of 15 mmHg.   Patient Profile  KRISTEEN LANTZ is a 80 y.o. female with interstitial lung disease, history of PE, hyperlipidemia, diabetes type 2,.  Course complicated by on 3/61/4431.  Assessment & Plan    1.  Atrial fibrillation -Converted to sinus rhythm.  Was placed on amiodarone drip.  Not on beta-blocker bradycardia.  Amiodarone was held yesterday.  I have restarted amiodarone today. -She will continue amiodarone and watch heart monitor for significant bradycardia. -Holding beta-blocker for now. -Continue Eliquis. -Liver enzymes slightly elevated.  We will continue to monitor this morning.  2.  Sinus bradycardia -Secondary combination of AV block.  Amiodarone restarted today.  3.  Tachycardia mediated cardiomyopathy -  EF 45% and echocardiogram.  Thought to be related to A. Fib. -Continue with uncontrolled Friday. -Holding beta-blocker due to significant bradycardia. -We will add losartan 12.5 mg daily -She can have EF reevaluated in 3 months.  For questions or updates, please contact [place] Please consult [email or web address] for contact info under   Time Spent with Patient: I have spent a total of 25 minutes with patient reviewing hospital notes, telemetry, EKGs, labs and examining the patient as well as establishing an assessment and plan that was discussed with the patient.  > 50% of time was spent in direct patient care.    Signed, Addison Naegeli. Audie Box, [place]  01/24/2021 12:42 PM

## 2021-01-24 NOTE — TOC Progression Note (Addendum)
Transition of Care [facility]) - Progression Note    Patient Details  Name: Elizabeth Melton MRN: [id number] Date of Birth: Jul 26, 1941  Transition of Care [facility]) CM/SW Contact  Leeroy Cha, RN Phone Number: 01/24/2021, 7:37 AM  Clinical Narrative:    80 y.o.femalewith medical history significant ofILD(ANA positive)who was previously on nocturnal oxygen but has been off for some time, history of saddle PE recently stopped Eliquis, essential hypertension, hypothyroidism, hyperlipidemia, type 2 diabetes mellitus who presented to the ED with progressive shortness of breath and intermittent dizziness over the past week. Patient initially thought it was related to her Eliquis, she contacted her pulmonologist who elected to have patient stop her anticoagulant. Patient symptoms continue to progress now associated with decreased appetite, myalgias.Patient denies any sick contacts, no recent travel and no other changes in medication regimen. Patient specifically denies visual changes, no chest pain, no palpitations, no nausea/vomiting/diarrhea, no abdominal pain, no chills/night sweats, no paresthesias.  She was admitted for acute hypoxic respiratory failure 2/2 COVID 19 pneumonia.  PLAN: RETURN TO HOME WITH FAMILY. Patient is unvaccinated against Covid-19.  Expected Discharge Plan: [place] Barriers to Discharge: No Barriers Identified  Expected Discharge Plan and Services Expected Discharge Plan: [place]   Discharge Planning Services: CM Consult Post Acute Care Choice: [place] arrangements for the past 2 months: Single Family Home                                       Social Determinants of Health (SDOH) Interventions    Readmission Risk Interventions Readmission Risk Prevention Plan 09/10/2020  Post Dischage Appt Complete  Medication Screening Complete  Transportation Screening Complete  Some recent data might be  hidden

## 2021-01-24 NOTE — Progress Notes (Addendum)
PROGRESS NOTE    Elizabeth Melton  KGY:[id number] DOB: 1941/10/23 DOA: 01/16/2021 PCP: Kathyrn Lass, MD  Chief Complaint  Patient presents with  . Shortness of Breath  . Dizziness    Brief Narrative: Elizabeth L Brunsonis Elizabeth Melton 80 y.o.femalewith medical history significant ofILD(ANA positive)who was previously on nocturnal oxygen but has been off for some time, history of saddle PE recently stopped Eliquis, essential hypertension, hypothyroidism, hyperlipidemia, type 2 diabetes mellitus who presented to the ED with progressive shortness of breath and intermittent dizziness over the past week. Patient initially thought it was related to her Eliquis, she contacted her pulmonologist who elected to have patient stop her anticoagulant. Patient symptoms continue to progress now associated with decreased appetite, myalgias.Patient denies any sick contacts, no recent travel and no other changes in medication regimen. Patient specifically denies visual changes, no chest pain, no palpitations, no nausea/vomiting/diarrhea, no abdominal pain, no chills/night sweats, no paresthesias.  She was admitted for acute hypoxic respiratory failure 2/2 COVID 19 pneumonia.   Assessment & Plan:   Principal Problem:   Pneumonia due to COVID-19 virus Active Problems:   Hypothyroidism   Hyperlipidemia   Diabetes mellitus without complication ([place])   Essential (primary) hypertension   ILD (interstitial lung disease) ([place])   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[place])   Acute respiratory failure with hypoxia ([place])   Atrial fibrillation with RVR ([place])   Demand ischemia ([facility])  Atrial Fibrillation with RVR Converted to sinus brady (2/12) afternoon Continued sinus brady 2/14 Holding amiodarone and metoprolol at this time PCCM c/s 2/9 in case of need for cardioversion with hypotension - now signed off TSH wnl 2/6.  Echo with grade II diastolic dysfunction.  Severely elevated PASP.   Repeat echo 2/12 with EF  40-45%, mildly reduced RVSF, global hypokinesis - possibly tachycardia mediate (needs repeat echo in 3 months) -> consider losartan as BP allows chadvasc at least 6, she's already on eliquis, continue Cardiology consult, appreciate assistance - will defer amiodarone restart to cards.  Will need AF clinic follow up.  Elevated Troponin   Demand Ischemia in setting of afib with RVR and hypotension No CP or SOB.  She did c/o intermittent L flank pain yesterday, resolved today. Initial EKG yesterday with T wave inversion II, III, aVF, ST depression V3-V6, thought to be rate related changes.  Repeat EKG appeared improved.   Troponin peaked to 1442 D/c aspirin.  On anticoagulation with eliquis. 2/12 echo with decreased EF 40-45%, mildly reduce RVSF (see report) per cards Cardiology c/s, appreciate recs - follow up with cardiology outpatient  A1c 7.6, ldl 62  Lightheadedness: sx resolved without intervention.  BP didn't appear low.  EKG with RVR.  No vertigo.  Exam without focal neurologic deficit.  Unclear what caused symptoms, ? RVR vs relative hypotension.   Seems improved, continue to monitor  Acute hypoxic respiratory failure secondary to acute Covid-19 viral pneumonia during the ongoing Covid 19 Pandemic - POA Progressive shortness of breath with associated myalgias and decreased appetite over the last week. Patient initially thought this was related to her home Eliquis, which she stopped taking on 01/13/2021 after discussion with her pulmonologist. Patient is unvaccinated against Covid-19.  -- Currently on 2 L [place] (adjust as tolerated) -- CT PE protocol 2/6 without evidence of acute PE, superimposed covid 19 infection on Elizabeth Melton background of ILD, mediastinal and hilar adenopathy (likely reactive) --COVID test:+ 01/16/2021 --Remdesivir, plan 5-day course (Day#5/5) --Continue Solumedrolto 23m IV q12h - taper -- I/O, daily weights --  prone as able, OOB, IS, flutter  COVID-19 Labs  Recent Labs     01/22/21 0248 01/23/21 0239 01/24/21 0328  DDIMER 1.16* 1.37* 1.10*  FERRITIN  --  1,326* 1,652*  CRP 0.7 1.2* 2.2*    Lab Results  Component Value Date   SARSCOV2NAA POSITIVE (Elizabeth Melton) 01/16/2021   [place] NEGATIVE 09/05/2020   [place] NEGATIVE 03/13/2020   SARSCOV2NAA NOT DETECTED 10/20/2019   Abdominal Discomfort  Constipation: bowel regimen, enema  Recent saddle pulmonary embolism Patient with recent large volume thrombus including Elizabeth Melton thin saddle PE with associated right heart strain on CTA 09/05/2020. Patient was started on IV heparin at that time and PCCM and IR consulted for TPA consideration but patient declined. Patient was on IV heparin for 48 hours and transition to Eliquis. Patient recently stopped her Eliquis after contacting her pulmonology office as thought her current shortness of breath related to Eliquis use. CT angiogram chest negative for PE. --Continue home Eliquis  T2DM: A1c 7.6. doesn't appear she's on home meds. SSI, basal (will d/c basal with hypoglycemia and tapering steroids)  Adjust prn  Elevated LFTs: possibly related to covid vs hypotension.  Continue to follow.  Worsening today, fluctuating.   Continue to monitor, follow acute hepatitis panel Hold pravastatin for now.  Related to amio?   Essential hypertension Currently off antihypertensives.   Hyperlipidemia On lovastatin 40 mg p.o. daily at home, will continue with pravastatin hospital substitution while inpatient  Interstitial lung disease History of pulmonary nodules Patient follows with pulmonology, Dr. Chase Melton outpatient. History of ANA positive. Was previously on nocturnal oxygen, but has been off for some time. CT high-resolution chest 08/28/2018 with scattered solid pulmonary nodules, largest 7 mm basilar right upper lobe which have been stable. --Continue Combivent inhaler as above --Continue supplemental oxygen, titrate to maintain SPO2 greater than 88% --Outpatient  follow-up with pulmonology on discharge  DVT prophylaxis: eliquis Code Status: full Family Communication: none at bedside - daughter 2/14 over phone Disposition:   Status is: Inpatient  Remains inpatient appropriate because:Inpatient level of care appropriate due to severity of illness   Dispo: The patient is from: Home              Anticipated d/c is to: Home              Anticipated d/c date is: > 3 days              Patient currently is not medically stable to d/c.   Difficult to place patient No  Consultants:   cardiology  Procedures:  Echo IMPRESSIONS    1. Left ventricular ejection fraction, by estimation, is 60 to 65%. The  left ventricle has normal function. The left ventricle has no regional  wall motion abnormalities. There is mild left ventricular hypertrophy.  Left ventricular diastolic parameters  are consistent with Grade II diastolic dysfunction (pseudonormalization).  Elevated left ventricular end-diastolic pressure. The E/e' is 24.  2. Right ventricular systolic function is normal. The right ventricular  size is normal. There is severely elevated pulmonary artery systolic  pressure. The estimated right ventricular systolic pressure is 47.8 mmHg.  3. Left atrial size was severely dilated.  4. Right atrial size was mildly dilated.  5. The mitral valve is grossly normal. Trivial mitral valve  regurgitation.  6. The aortic valve is tricuspid. Aortic valve regurgitation is mild.  Mild aortic valve stenosis. Aortic valve area, by VTI measures 1.71 cm.  Aortic valve mean gradient measures 8.0 mmHg. Aortic valve  Vmax measures  2.17 m/s.  7. The inferior vena cava is normal in size with greater than 50%  respiratory variability, suggesting right atrial pressure of 3 mmHg.   Comparison(s): Changes from prior study are noted. 09/06/20: LVEF 60-65%,  RVSP 70 mmHG, severely dilated and hypokinetic RV. In my review of the  previous study, the RV does not  appear significantly dilated or  hypokinetic.   Antimicrobials: Anti-infectives (From admission, onward)   Start     Dose/Rate Route Frequency Ordered Stop   01/17/21 1000  remdesivir 100 mg in sodium chloride 0.9 % 100 mL IVPB       "Followed by" Linked Group Details   100 mg 200 mL/hr over 30 Minutes Intravenous Daily 01/16/21 1614 01/20/21 1135   01/16/21 1700  remdesivir 200 mg in sodium chloride 0.9% 250 mL IVPB       "Followed by" Linked Group Details   200 mg 580 mL/hr over 30 Minutes Intravenous Once 01/16/21 1614 01/16/21 1750     Subjective: Complains of continued discomfort  Objective: Vitals:   01/24/21 0400 01/24/21 0600 01/24/21 0800 01/24/21 0900  BP: (!) 144/40 (!) 138/47 (!) 136/42   Pulse: (!) 50 (!) 57 (!) 52   Resp: _0 Temp: 98 F (36.7 C)  97.7 F (36.5 C)   TempSrc: Oral  Oral   SpO2: 90% (!) 89% (!) 89% 91%  Weight:      Height:        Intake/Output Summary (Last 24 hours) at 01/24/2021 1147 Last data filed at 01/24/2021 0600 Gross per 24 hour  Intake 14.1 ml  Output 700 ml  Net -685.9 ml   Filed Weights   01/16/21 1312  Weight: 80.7 kg    Examination:  General: No acute distress. Cardiovascular: brady, regular Lungs: Clear to auscultation bilaterally Abdomen: Soft, nontender, nondistended Neurological: Alert and oriented 3. Moves all extremities 4. Cranial nerves II through XII grossly intact. Skin: Warm and dry. No rashes or lesions. Extremities: No clubbing or cyanosis. No edema.    Data Reviewed: I have personally reviewed following labs and imaging studies  CBC: Recent Labs  Lab 01/20/21 0239 01/21/21 0242 01/22/21 0248 01/23/21 0239 01/24/21 0328  WBC 12.4* 13.4* 15.3* 17.5* 11.5*  NEUTROABS 10.4* 11.5* 13.5* 14.6* 9.0*  HGB 15.4* 15.1* 15.4* 15.2* 15.2*  HCT 46.3* 47.4* 47.4* 47.3* 46.3*  MCV 97.9 101.7* 100.2* 101.9* 99.1  PLT 161 203 216 202 638    Basic Metabolic Panel: Recent Labs  Lab  01/19/21 2004 01/20/21 0239 01/21/21 0242 01/22/21 0248 01/23/21 0239 01/24/21 0328  NA 141 136 138 135 138 140  K 4.5 4.7 5.1 4.7 4.8 4.6  CL 109 103 106 100 104 105  CO2 23 22 19* _1 GLUCOSE 113* 273* 267* 348* 152* 80  BUN 32* 32* 23 29* 33* 27*  CREATININE 1.12* 0.94 0.87 0.90 0.91 0.78  CALCIUM 8.4* 8.2* 8.4* 8.0* 8.3* 8.3*  MG 2.4 2.3  --  2.4 2.5* 2.2  PHOS  --  3.0  --  2.8 3.4 3.3    GFR: Estimated Creatinine Clearance: 62.1 mL/min (by C-G formula based on SCr of 0.78 mg/dL).  Liver Function Tests: Recent Labs  Lab 01/20/21 0239 01/21/21 0242 01/22/21 0248 01/23/21 0239 01/24/21 0328  AST 91* 67* 42* 61* 148*  ALT 48* 45* 39 42 64*  ALKPHOS 53 55 53 56 51  BILITOT 0.8 0.9 0.8 1.0 1.6*  PROT 6.2*  6.3* 5.9* 5.9* 5.8*  ALBUMIN 2.7* 2.6* 2.5* 2.5* 2.4*    CBG: Recent Labs  Lab 01/23/21 1249 01/23/21 1711 01/23/21 2123 01/24/21 0900 01/24/21 0904  GLUCAP 109* 202* 177* 67* 103*     Recent Results (from the past 240 hour(s))  SARS Coronavirus 2 by RT PCR (hospital order, performed in [facility] hospital lab) Nasopharyngeal Nasopharyngeal Swab     Status: Abnormal   Collection Time: 01/16/21  1:56 PM   Specimen: Nasopharyngeal Swab  Result Value Ref Range Status   SARS Coronavirus 2 POSITIVE (Elizabeth Melton) NEGATIVE Final    Comment: RESULT CALLED TO, READ BACK BY AND VERIFIED WITH: Elizabeth Melton,Elizabeth Melton AT 1552 ON 01/16/2021 BY JPM (NOTE) SARS-CoV-2 target nucleic acids are DETECTED  SARS-CoV-2 RNA is generally detectable in upper respiratory specimens  during the acute phase of infection.  Positive results are indicative  of the presence of the identified virus, but do not rule out bacterial infection or co-infection with other pathogens not detected by the test.  Clinical correlation with patient history and  other diagnostic information is necessary to determine patient infection status.  The expected result is negative.  Fact Sheet for Patients:    StrictlyIdeas.no   Fact Sheet for Healthcare Providers:   BankingDealers.co.za    This test is not yet approved or cleared by the [place] FDA and  has been authorized for detection and/or diagnosis of SARS-CoV-2 by FDA under an Emergency Use Authorization (EUA).  This EUA will remain in effect (meaning this  test can be used) for the duration of  the COVID-19 declaration under Section 564(b)(1) of the Act, 21 U.S.C. section 360-bbb-3(b)(1), unless the authorization is terminated or revoked sooner.  Performed at [facility], [place] [address], [place], [place] [address]   Blood Culture (routine x 2)     Status: None   Collection Time: 01/16/21  2:01 PM   Specimen: BLOOD  Result Value Ref Range Status   Specimen Description   Final    BLOOD RIGHT ANTECUBITAL Performed at [place] [address] [place], [place] [address]    Special Requests   Final    BOTTLES DRAWN AEROBIC AND ANAEROBIC Blood Culture adequate volume Performed at [place] [address], [place], [place] [address]    Culture   Final    NO GROWTH 5 DAYS Performed at [facility], [place] [address], [place], [place] [address]    Report Status 01/21/2021 FINAL  Final  Blood Culture (routine x 2)     Status: None   Collection Time: 01/16/21  2:30 PM   Specimen: BLOOD  Result Value Ref Range Status   Specimen Description   Final    BLOOD LEFT ANTECUBITAL BOTTLES DRAWN AEROBIC AND ANAEROBIC Blood Culture adequate volume Performed at [place] [address] [place], [place] [address]    Special Requests   Final    BOTTLES DRAWN AEROBIC AND ANAEROBIC Blood Culture adequate volume   Culture   Final    NO GROWTH 5 DAYS Performed at [facility], [place] [address] [place], [place] [address]    Report Status 01/21/2021 FINAL  Final  MRSA PCR Screening     Status: None    Collection Time: 01/19/21  6:58 PM   Specimen: Nasal Mucosa; Nasopharyngeal  Result Value Ref Range Status   MRSA by PCR NEGATIVE NEGATIVE Final    Comment:  The GeneXpert MRSA Assay (FDA approved for NASAL specimens only), is one component of Elizabeth Melton comprehensive MRSA colonization surveillance program. It is not intended to diagnose MRSA infection nor to guide or monitor treatment for MRSA infections. Performed at [facility], [place] [address], [place], [place] [address]          Radiology Studies: No results found.      Scheduled Meds: . apixaban  5 mg Oral BID  . vitamin C  500 mg Oral Daily  . Chlorhexidine Gluconate Cloth  6 each Topical Daily  . insulin aspart  0-15 Units Subcutaneous TID WC  . insulin glargine  15 Units Subcutaneous Daily  . Ipratropium-Albuterol  1 puff Inhalation BID  . ipratropium-albuterol      . levothyroxine  75 mcg Oral QAC breakfast  . mouth rinse  15 mL Mouth Rinse BID  . polyethylene glycol  17 g Oral TID  . pravastatin  40 mg Oral q1800  . [START ON 01/25/2021] predniSONE  30 mg Oral Q breakfast   Followed by  . [START ON 01/27/2021] predniSONE  20 mg Oral Q breakfast   Followed by  . [START ON 01/29/2021] predniSONE  10 mg Oral Q breakfast  . senna-docusate  2 tablet Oral QHS  . sorbitol, milk of mag, mineral oil, glycerin (SMOG) enema  960 mL Rectal Once  . umeclidinium bromide  1 puff Inhalation Daily  . zinc sulfate  220 mg Oral Daily   Continuous Infusions:    LOS: 8 days    Time spent: over 30 min    Fayrene Helper, MD [facility]   To contact the attending provider between 7A-7P or the covering provider during after hours 7P-7A, please log into the web site [email or web address] and access using universal [place] password for that web site. If you do not have the password, please call the hospital operator.  01/24/2021, 11:47 AM

## 2021-01-24 NOTE — Plan of Care (Signed)
  Problem: Education: Goal: Knowledge of General Education information will improve Description: Including pain rating scale, medication(s)/side effects and non-pharmacologic comfort measures Outcome: Progressing   Problem: Elimination: Goal: Will not experience complications related to bowel motility Outcome: Progressing   Problem: Safety: Goal: Ability to remain free from injury will improve Outcome: Progressing

## 2021-01-24 NOTE — Progress Notes (Signed)
Physical Therapy Treatment Patient Details Name: Elizabeth Melton MRN: [id number] DOB: [birth year] Today's Date: 01/24/2021    History of Present Illness Pt is 80 yo female admitted with COVID and afib RVR. Hx of PE, DM, ILD, R LE DVT    PT Comments    Patient  somewhat restless and a bit weaker  Presentation  During mobility, stating  The need to have a BM. Patient's VSS stable during activity. Continue PT and monitor for any changes in DC recommendations.  Follow Up Recommendations  Home health PT;Supervision - Intermittent     Equipment Recommendations  None recommended by PT    Recommendations for Other Services       Precautions / Restrictions Precautions Precautions: Fall Precaution Comments: monitor HR and BP    Mobility  Bed Mobility Overal bed mobility: Needs Assistance Bed Mobility: Supine to Sit     Supine to sit: Min assist     General bed mobility comments: assist with trunk, use of bed rail    Transfers Overall transfer level: Needs assistance Equipment used: Rolling walker (2 wheeled) Transfers: Sit to/from [facility] Sit to Stand: Min assist Stand pivot transfers: Min assist       General transfer comment: Noted moving slowly, needed assistance  for Rw, restless and decreased concentration.  Ambulation/Gait                 Stairs             Wheelchair Mobility    Modified Rankin (Stroke Patients Only)       Balance Overall balance assessment: Needs assistance Sitting-balance support: Feet supported;Bilateral upper extremity supported Sitting balance-Leahy Scale: Fair Sitting balance - Comments: patient  focused on need for BM, decreased concentration on task   Standing balance support: Bilateral upper extremity supported;No upper extremity supported Standing balance-Leahy Scale: Fair                              Cognition Arousal/Alertness: Awake/alert Behavior During Therapy:  Restless Overall Cognitive Status: Impaired/Different from baseline Area of Impairment: Attention;Awareness;Safety/judgement;Following commands;Memory;Problem solving                   Current Attention Level: Selective       Awareness: Emergent Problem Solving: Slow processing General Comments: focused on need for BM      Exercises      General Comments        Pertinent Vitals/Pain Pain Assessment: No/denies pain    Home Living                      Prior Function            PT Goals (current goals can now be found in the care plan section) Progress towards PT goals: Progressing toward goals    Frequency           PT Plan Current plan remains appropriate    Co-evaluation              AM-PAC PT "6 Clicks" Mobility   Outcome Measure  Help needed turning from your back to your side while in a flat bed without using bedrails?: A Little Help needed moving from lying on your back to sitting on the side of a flat bed without using bedrails?: A Little Help needed moving to and from a bed to a chair (including a wheelchair)?: A Lot Help  needed standing up from a chair using your arms (e.g., wheelchair or bedside chair)?: A Lot Help needed to walk in hospital room?: A Lot Help needed climbing 3-5 steps with a railing? : A Lot 6 Click Score: 14    End of Session Equipment Utilized During Treatment: Oxygen Activity Tolerance: Patient limited by fatigue Patient left: in chair;with call bell/phone within reach Nurse Communication: Mobility status PT Visit Diagnosis: Unsteadiness on feet (R26.81);Difficulty in walking, not elsewhere classified (R26.2)     Time: 7092-9574 PT Time Calculation (min) (ACUTE ONLY): 20 min  Charges:  $Therapeutic Activity: 8-22 mins                    Tresa Endo PT Acute Rehabilitation Services Pager [phone] Office [phone]    Claretha Cooper 01/24/2021, 2:39 PM

## 2021-01-25 ENCOUNTER — Inpatient Hospital Stay (HOSPITAL_COMMUNITY): Payer: Medicare HMO

## 2021-01-25 DIAGNOSIS — J1282 Pneumonia due to coronavirus disease 2019: Secondary | ICD-10-CM | POA: Diagnosis not present

## 2021-01-25 DIAGNOSIS — U071 COVID-19: Secondary | ICD-10-CM | POA: Diagnosis not present

## 2021-01-25 DIAGNOSIS — R7989 Other specified abnormal findings of blood chemistry: Secondary | ICD-10-CM

## 2021-01-25 LAB — URINALYSIS, ROUTINE W REFLEX MICROSCOPIC
Bilirubin Urine: NEGATIVE
Glucose, UA: NEGATIVE mg/dL
Ketones, ur: NEGATIVE mg/dL
Nitrite: NEGATIVE
Protein, ur: 30 mg/dL — AB
Specific Gravity, Urine: 1.027 (ref 1.005–1.030)
pH: 6 (ref 5.0–8.0)

## 2021-01-25 LAB — COMPREHENSIVE METABOLIC PANEL
ALT: 80 U/L — ABNORMAL HIGH (ref 0–44)
AST: 156 U/L — ABNORMAL HIGH (ref 15–41)
Albumin: 2.4 g/dL — ABNORMAL LOW (ref 3.5–5.0)
Alkaline Phosphatase: 52 U/L (ref 38–126)
Anion gap: 9 (ref 5–15)
BUN: 20 mg/dL (ref 8–23)
CO2: 26 mmol/L (ref 22–32)
Calcium: 8.3 mg/dL — ABNORMAL LOW (ref 8.9–10.3)
Chloride: 103 mmol/L (ref 98–111)
Creatinine, Ser: 0.73 mg/dL (ref 0.44–1.00)
GFR, Estimated: >60 mL/min (ref >60)
Glucose, Bld: 138 mg/dL — ABNORMAL HIGH (ref 70–99)
Potassium: 4.5 mmol/L (ref 3.5–5.1)
Sodium: 138 mmol/L (ref 135–145)
Total Bilirubin: 1.4 mg/dL — ABNORMAL HIGH (ref 0.3–1.2)
Total Protein: 5.7 g/dL — ABNORMAL LOW (ref 6.5–8.1)

## 2021-01-25 LAB — CBC WITH DIFFERENTIAL/PLATELET
Abs Immature Granulocytes: 0.13 10*3/uL — ABNORMAL HIGH (ref 0.00–0.07)
Basophils Absolute: 0 10*3/uL (ref 0.0–0.1)
Basophils Relative: 0 %
Eosinophils Absolute: 0 10*3/uL (ref 0.0–0.5)
Eosinophils Relative: 0 %
HCT: 46.1 % — ABNORMAL HIGH (ref 36.0–46.0)
Hemoglobin: 15 g/dL (ref 12.0–15.0)
Immature Granulocytes: 1 %
Lymphocytes Relative: 12 %
Lymphs Abs: 1.5 10*3/uL (ref 0.7–4.0)
MCH: 32.1 pg (ref 26.0–34.0)
MCHC: 32.5 g/dL (ref 30.0–36.0)
MCV: 98.5 fL (ref 80.0–100.0)
Monocytes Absolute: 0.9 10*3/uL (ref 0.1–1.0)
Monocytes Relative: 7 %
Neutro Abs: 9.4 10*3/uL — ABNORMAL HIGH (ref 1.7–7.7)
Neutrophils Relative %: 80 %
Platelets: 165 10*3/uL (ref 150–400)
RBC: 4.68 MIL/uL (ref 3.87–5.11)
RDW: 14.6 % (ref 11.5–15.5)
WBC: 11.9 10*3/uL — ABNORMAL HIGH (ref 4.0–10.5)
nRBC: 0.2 % (ref 0.0–0.2)

## 2021-01-25 LAB — GLUCOSE, CAPILLARY
Glucose-Capillary: 116 mg/dL — ABNORMAL HIGH (ref 70–99)
Glucose-Capillary: 211 mg/dL — ABNORMAL HIGH (ref 70–99)
Glucose-Capillary: 168 mg/dL — ABNORMAL HIGH (ref 70–99)
Glucose-Capillary: 281 mg/dL — ABNORMAL HIGH (ref 70–99)

## 2021-01-25 LAB — PHOSPHORUS: Phosphorus: 3 mg/dL (ref 2.5–4.6)

## 2021-01-25 LAB — D-DIMER, QUANTITATIVE: D-Dimer, Quant: 5.03 ug/mL-FEU — ABNORMAL HIGH (ref 0.00–0.50)

## 2021-01-25 LAB — FERRITIN: Ferritin: 1708 ng/mL — ABNORMAL HIGH (ref 11–307)

## 2021-01-25 LAB — C-REACTIVE PROTEIN: CRP: 3.7 mg/dL — ABNORMAL HIGH (ref <1.0)

## 2021-01-25 LAB — MAGNESIUM: Magnesium: 2.3 mg/dL (ref 1.7–2.4)

## 2021-01-25 MED ORDER — METOPROLOL SUCCINATE ER 25 MG PO TB24: 25.0000 mg | ORAL_TABLET | Freq: Every day | ORAL | Status: DC

## 2021-01-25 NOTE — Progress Notes (Signed)
PROGRESS NOTE    Elizabeth Melton  ZOX:[id number] DOB: Mar 21, 1941 DOA: 01/16/2021 PCP: Kathyrn Lass, MD  Chief Complaint  Patient presents with  . Shortness of Breath  . Dizziness    Brief Narrative: Elizabeth Melton [name] 80 y.o.femalewith medical history significant ofILD(ANA positive)who was previously on nocturnal oxygen but has been off for some time, history of saddle PE recently stopped Eliquis, essential hypertension, hypothyroidism, hyperlipidemia, type 2 diabetes mellitus who presented to the ED with progressive shortness of breath and intermittent dizziness over the past week. Patient initially thought it was related to her Eliquis, she contacted her pulmonologist who elected to have patient stop her anticoagulant. Patient symptoms continue to progress now associated with decreased appetite, myalgias.Patient denies any sick contacts, no recent travel and no other changes in medication regimen. Patient specifically denies visual changes, no chest pain, no palpitations, no nausea/vomiting/diarrhea, no abdominal pain, no chills/night sweats, no paresthesias.  She was admitted for acute hypoxic respiratory failure 2/2 COVID 19 pneumonia.  Initially had planned to discharge last week, but developed Elizabeth Melton fib with RVR when working with therapy.  She became hypotensive on diltiazem and was started on amiodarone.  Amiodarone has now been d/c'd due to her elevated liver enzymes.  Cardiology following.  Hopefully d/c 2/16 if stable.   Assessment & Plan:   Principal Problem:   Pneumonia due to COVID-19 virus Active Problems:   Hypothyroidism   Hyperlipidemia   Diabetes mellitus without complication ([place])   Essential (primary) hypertension   ILD (interstitial lung disease) ([place])   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[place])   Acute respiratory failure with hypoxia ([place])   Atrial fibrillation with RVR ([place])   Demand ischemia ([facility])  Atrial Fibrillation with RVR Converted to  sinus brady (2/12) afternoon Continued sinus brady 2/15 Holding amiodarone due to elevated LFT's Cards restarted metoprolol 25 mg daily, follow PCCM c/s 2/9 in case of need for cardioversion with hypotension - now signed off TSH wnl 2/6.  Echo with grade II diastolic dysfunction.  Severely elevated PASP.   Repeat echo 2/12 with EF 40-45%, mildly reduced RVSF, global hypokinesis - possibly tachycardia mediate (needs repeat echo in 3 months) -> losartan per cards, metoprolol.  Recommending 20 mg lasix prn for weight gain or LE edema. chadvasc at least 6, she's already on eliquis, continue Cardiology consult, appreciate assistance - Will need AF clinic follow up.  Elevated Troponin   Demand Ischemia in setting of afib with RVR and hypotension No CP or SOB.  She did c/o intermittent L flank pain yesterday, resolved today. Initial EKG yesterday with T wave inversion II, III, aVF, ST depression V3-V6, thought to be rate related changes.  Repeat EKG appeared improved.   Troponin peaked to 1442 D/c aspirin.  On anticoagulation with eliquis. 2/12 echo with decreased EF 40-45%, mildly reduce RVSF (see report) per cards Cardiology c/s, appreciate recs - follow up with cardiology outpatient  A1c 7.6, ldl 62  Elevated D Dimer: negative LE [place].  Rose to 5 today from ~1. Continue to monitor, she's on eliquis  Lightheadedness: sx resolved without intervention.  BP didn't appear low.  EKG with RVR.  No vertigo.  Exam without focal neurologic deficit.  Unclear what caused symptoms, ? RVR vs relative hypotension.   Seems improved, continue to monitor  Acute hypoxic respiratory failure secondary to acute Covid-19 viral pneumonia during the ongoing Covid 19 Pandemic - POA Progressive shortness of breath with associated myalgias and decreased appetite over the last week.  Patient initially thought this was related to her home Eliquis, which she stopped taking on 01/13/2021 after discussion with her pulmonologist.  Patient is unvaccinated against Covid-19.  -- Currently on 2 L [place] (adjust as tolerated) - she'll need home o2 -- CT PE protocol 2/6 without evidence of acute PE, superimposed covid 19 infection on Keelon Zurn background of ILD, mediastinal and hilar adenopathy (likely reactive) --COVID test:+ 01/16/2021 --Remdesivir, plan 5-day course (Day#5/5) --Continue Solumedrolto 68m IV q12h - taper -- I/O, daily weights -- prone as able, OOB, IS, flutter  COVID-19 Labs  Recent Labs    01/23/21 0239 01/24/21 0328 01/25/21 0441  DDIMER 1.37* 1.10* 5.03*  FERRITIN 1,326* 1,652* 1,708*  CRP 1.2* 2.2* 3.7*    Lab Results  Component Value Date   SARSCOV2NAA POSITIVE ([place]) 01/16/2021   [place]GAT[place] 09/05/2020   [place]GAT[place] 03/13/2020   [place] DETECTED 10/20/2019   Abdominal Discomfort  Constipation: bowel regimen, enema  Recent saddle pulmonary embolism Patient with recent large volume thrombus including [place] thin saddle PE with associated right heart strain on CTA 09/05/2020. Patient was started on IV heparin at that time and PCCM and IR consulted for TPA consideration but patient declined. Patient was on IV heparin for 48 hours and transition to Eliquis. Patient recently stopped her Eliquis after contacting her pulmonology office as thought her current shortness of breath related to Eliquis use. CT angiogram chest negative for PE. --Continue home Eliquis -- follow repeat d dimer as noted above  T2DM: A1c 7.6. doesn't appear she's on home meds. SSI, basal (will d/c basal with hypoglycemia and tapering steroids)  Adjust prn  Elevated LFTs: possibly related to covid vs hypotension.  Continue to follow.  Worsening today, fluctuating.   Continue to monitor, follow acute hepatitis panel (negative) Hold pravastatin for now.  Related to amio?  Continue to monitor  Essential hypertension Currently off antihypertensives.   Hyperlipidemia On hold as noted  above  Hematuria Urine in canister is red.  11-20 RBC's on UA.  Follow culture.   Will need outpatient follow up  Interstitial lung disease History of pulmonary nodules Patient follows with pulmonology, Dr. RChase Calleroutpatient. History of ANA positive. Was previously on nocturnal oxygen, but has been off for some time. CT high-resolution chest 08/28/2018 with scattered solid pulmonary nodules, largest 7 mm basilar right upper lobe which have been stable. --Continue Combivent inhaler as above --Continue supplemental oxygen, titrate to maintain SPO2 greater than 88% --Outpatient follow-up with pulmonology on discharge  DVT prophylaxis: eliquis Code Status: full Family Communication: none at bedside - daughter 2/15 over phone Disposition:   Status is: Inpatient  Remains inpatient appropriate because:Inpatient level of care appropriate due to severity of illness   Dispo: The patient is from: Home              Anticipated d/c is to: Home              Anticipated d/c date is: > 3 days              Patient currently is not medically stable to d/c.   Difficult to place patient No  Consultants:   cardiology  Procedures:  Echo IMPRESSIONS    1. Left ventricular ejection fraction, by estimation, is 60 to 65%. The  left ventricle has normal function. The left ventricle has no regional  wall motion abnormalities. There is mild left ventricular hypertrophy.  Left ventricular diastolic parameters  are consistent with Grade II diastolic dysfunction (pseudonormalization).  Elevated left ventricular end-diastolic pressure. The E/e' is 24.  2. Right ventricular systolic function is normal. The right ventricular  size is normal. There is severely elevated pulmonary artery systolic  pressure. The estimated right ventricular systolic pressure is 60.6 mmHg.  3. Left atrial size was severely dilated.  4. Right atrial size was mildly dilated.  5. The mitral valve is grossly normal.  Trivial mitral valve  regurgitation.  6. The aortic valve is tricuspid. Aortic valve regurgitation is mild.  Mild aortic valve stenosis. Aortic valve area, by VTI measures 1.71 cm.  Aortic valve mean gradient measures 8.0 mmHg. Aortic valve Vmax measures  2.17 m/s.  7. The inferior vena cava is normal in size with greater than 50%  respiratory variability, suggesting right atrial pressure of 3 mmHg.   Comparison(s): Changes from prior study are noted. 09/06/20: LVEF 60-65%,  RVSP 70 mmHG, severely dilated and hypokinetic RV. In my review of the  previous study, the RV does not appear significantly dilated or  hypokinetic.   Antimicrobials: Anti-infectives (From admission, onward)   Start     Dose/Rate Route Frequency Ordered Stop   01/17/21 1000  remdesivir 100 mg in sodium chloride 0.9 % 100 mL IVPB       "Followed by" Linked Group Details   100 mg 200 mL/hr over 30 Minutes Intravenous Daily 01/16/21 1614 01/20/21 1135   01/16/21 1700  remdesivir 200 mg in sodium chloride 0.9% 250 mL IVPB       "Followed by" Linked Group Details   200 mg 580 mL/hr over 30 Minutes Intravenous Once 01/16/21 1614 01/16/21 1750     Subjective: No new complaints, feeling Manny Vitolo little better  Objective: Vitals:   01/24/21 1918 01/24/21 2316 01/25/21 0318 01/25/21 1523  BP: (!) 149/64 (!) 121/56 (!) 124/49 137/81  Pulse: 60 (!) 52 60 89  Resp: _0 Temp: 98.1 F (36.7 C) 97.8 F (36.6 C) 97.7 F (36.5 C) 98.1 F (36.7 C)  TempSrc: Oral   Oral  SpO2: 94% 98% 96% 96%  Weight: 78.4 kg     Height: _1  (1.676 m)       Intake/Output Summary (Last 24 hours) at 01/25/2021 2015 Last data filed at 01/25/2021 1500 Gross per 24 hour  Intake 827 ml  Output 350 ml  Net 477 ml   Filed Weights   01/16/21 1312 01/24/21 1918  Weight: 80.7 kg 78.4 kg    Examination:  General: No acute distress. Cardiovascular: brady, regular Lungs: Clear to auscultation bilaterally  Abdomen: Soft,  nontender, nondistended Neurological: Alert and oriented 3. Moves all extremities 4. Cranial nerves II through XII grossly intact. Skin: Warm and dry. No rashes or lesions. Extremities: No clubbing or cyanosis. No edema.   Data Reviewed: I have personally reviewed following labs and imaging studies  CBC: Recent Labs  Lab 01/21/21 0242 01/22/21 0248 01/23/21 0239 01/24/21 0328 01/25/21 0441  WBC 13.4* 15.3* 17.5* 11.5* 11.9*  NEUTROABS 11.5* 13.5* 14.6* 9.0* 9.4*  HGB 15.1* 15.4* 15.2* 15.2* 15.0  HCT 47.4* 47.4* 47.3* 46.3* 46.1*  MCV 101.7* 100.2* 101.9* 99.1 98.5  PLT 203 216 202 183 770    Basic Metabolic Panel: Recent Labs  Lab 01/20/21 0239 01/21/21 0242 01/22/21 0248 01/23/21 0239 01/24/21 0328 01/25/21 0441  NA 136 138 135 138 140 138  K 4.7 5.1 4.7 4.8 4.6 4.5  CL 103 106 100 104 105 103  CO2 22 19* _2 GLUCOSE  273* 267* 348* 152* 80 138*  BUN 32* 23 29* 33* 27* 20  CREATININE 0.94 0.87 0.90 0.91 0.78 0.73  CALCIUM 8.2* 8.4* 8.0* 8.3* 8.3* 8.3*  MG 2.3  --  2.4 2.5* 2.2 2.3  PHOS 3.0  --  2.8 3.4 3.3 3.0    GFR: Estimated Creatinine Clearance: 61.2 mL/min (by C-G formula based on SCr of 0.73 mg/dL).  Liver Function Tests: Recent Labs  Lab 01/21/21 0242 01/22/21 0248 01/23/21 0239 01/24/21 0328 01/25/21 0441  AST 67* 42* 61* 148* 156*  ALT 45* 39 42 64* 80*  ALKPHOS 55 53 56 51 52  BILITOT 0.9 0.8 1.0 1.6* 1.4*  PROT 6.3* 5.9* 5.9* 5.8* 5.7*  ALBUMIN 2.6* 2.5* 2.5* 2.4* 2.4*    CBG: Recent Labs  Lab 01/24/21 1704 01/24/21 2051 01/25/21 0719 01/25/21 1133 01/25/21 1652  GLUCAP 183* 192* 116* 211* 168*     Recent Results (from the past 240 hour(s))  SARS Coronavirus 2 by RT PCR (hospital order, performed in [facility] hospital lab) Nasopharyngeal Nasopharyngeal Swab     Status: Abnormal   Collection Time: 01/16/21  1:56 PM   Specimen: Nasopharyngeal Swab  Result Value Ref Range Status   SARS Coronavirus 2 POSITIVE ([place])  NEGATIVE Final    Comment: RESULT CALLED TO, READ BACK BY AND VERIFIED WITH: RIVERS,TJ AT 1552 ON 01/16/2021 BY JPM (NOTE) SARS-CoV-2 target nucleic acids are DETECTED  SARS-CoV-2 RNA is generally detectable in upper respiratory specimens  during the acute phase of infection.  Positive results are indicative  of the presence of the identified virus, but do not rule out bacterial infection or co-infection with other pathogens not detected by the test.  Clinical correlation with patient history and  other diagnostic information is necessary to determine patient infection status.  The expected result is negative.  Fact Sheet for Patients:   StrictlyIdeas.no   Fact Sheet for Healthcare Providers:   BankingDealers.co.za    This test is not yet approved or cleared by the [place] FDA and  has been authorized for detection and/or diagnosis of SARS-CoV-2 by FDA under an Emergency Use Authorization (EUA).  This EUA will remain in effect (meaning this  test can be used) for the duration of  the COVID-19 declaration under Section 564(b)(1) of the Act, 21 U.S.C. section 360-bbb-3(b)(1), unless the authorization is terminated or revoked sooner.  Performed at [facility], [place] [address] [place], [place] [address]   Blood Culture (routine x 2)     Status: None   Collection Time: 01/16/21  2:01 PM   Specimen: BLOOD  Result Value Ref Range Status   Specimen Description   Final    BLOOD RIGHT ANTECUBITAL Performed at [facility] [address], [place], [place] [address]    Special Requests   Final    BOTTLES DRAWN AEROBIC AND ANAEROBIC Blood Culture adequate volume Performed at [facility] [address] [place], [place] [address]    Culture   Final    NO GROWTH 5 DAYS Performed at [facility], [place] [address], [place], [place] [address]    Report Status 01/21/2021  FINAL  Final  Blood Culture (routine x 2)     Status: None   Collection Time: 01/16/21  2:30 PM   Specimen: BLOOD  Result Value Ref Range Status   Specimen Description   Final    BLOOD LEFT ANTECUBITAL BOTTLES DRAWN AEROBIC AND ANAEROBIC Blood Culture adequate  volume Performed at [facility], [place] [address], [place], [place] [address]    Special Requests   Final    BOTTLES DRAWN AEROBIC AND ANAEROBIC Blood Culture adequate volume   Culture   Final    NO GROWTH 5 DAYS Performed at [facility], [place] [address] [place], [place] [address]    Report Status 01/21/2021 FINAL  Final  MRSA PCR Screening     Status: None   Collection Time: 01/19/21  6:58 PM   Specimen: Nasal Mucosa; Nasopharyngeal  Result Value Ref Range Status   MRSA by PCR NEGATIVE NEGATIVE Final    Comment:        The GeneXpert MRSA Assay (FDA approved for NASAL specimens only), is one component of Kayren Holck comprehensive MRSA colonization surveillance program. It is not intended to diagnose MRSA infection nor to guide or monitor treatment for MRSA infections. Performed at [facility], [place] [address] [place], [place] [address]          Radiology Studies: DG CHEST PORT 1 VIEW  Result Date: 01/24/2021 CLINICAL DATA:  Shortness of breath EXAM: PORTABLE CHEST 1 VIEW COMPARISON:  01/16/2021 FINDINGS: Cardiac shadow is enlarged but stable. Aortic calcifications are again seen. Lungs show persistent but improved airspace opacity bilaterally. No new focal infiltrate is seen. No bony abnormality is seen. IMPRESSION: Improving airspace disease bilaterally. Electronically Signed   By: Inez Catalina M.D.   On: 01/24/2021 14:08   VAS [place] LOWER EXTREMITY VENOUS (DVT)  Result Date: 01/25/2021  Lower Venous DVT Study Indications: Elevated Ddimer.  Risk Factors: COVID 19 positive. Comparison Study: No prior studies. Performing Technologist: Oliver Hum RVT  Examination Guidelines: Aundrey Elahi  complete evaluation includes B-mode imaging, spectral Doppler, color Doppler, and power Doppler as needed of all accessible portions of each vessel. Bilateral testing is considered an integral part of Soo Steelman complete examination. Limited examinations for reoccurring indications may be performed as noted. The reflux portion of the exam is performed with the patient in reverse Trendelenburg.  +---------+---------------+---------+-----------+----------+--------------+ RIGHT    CompressibilityPhasicitySpontaneityPropertiesThrombus Aging +---------+---------------+---------+-----------+----------+--------------+ CFV      Full           Yes      Yes                                 +---------+---------------+---------+-----------+----------+--------------+ SFJ      Full                                                        +---------+---------------+---------+-----------+----------+--------------+ FV Prox  Full                                                        +---------+---------------+---------+-----------+----------+--------------+ FV Mid   Full                                                        +---------+---------------+---------+-----------+----------+--------------+ FV  DistalFull                                                        +---------+---------------+---------+-----------+----------+--------------+ PFV      Full                                                        +---------+---------------+---------+-----------+----------+--------------+ POP      Full           Yes      Yes                                 +---------+---------------+---------+-----------+----------+--------------+ PTV      Full                                                        +---------+---------------+---------+-----------+----------+--------------+ PERO     Full                                                         +---------+---------------+---------+-----------+----------+--------------+   +---------+---------------+---------+-----------+----------+--------------+ LEFT     CompressibilityPhasicitySpontaneityPropertiesThrombus Aging +---------+---------------+---------+-----------+----------+--------------+ CFV      Full           Yes      Yes                                 +---------+---------------+---------+-----------+----------+--------------+ SFJ      Full                                                        +---------+---------------+---------+-----------+----------+--------------+ FV Prox  Full                                                        +---------+---------------+---------+-----------+----------+--------------+ FV Mid   Full                                                        +---------+---------------+---------+-----------+----------+--------------+ FV DistalFull                                                        +---------+---------------+---------+-----------+----------+--------------+  PFV      Full                                                        +---------+---------------+---------+-----------+----------+--------------+ POP      Full           Yes      Yes                                 +---------+---------------+---------+-----------+----------+--------------+ PTV      Full                                                        +---------+---------------+---------+-----------+----------+--------------+ PERO     Full                                                        +---------+---------------+---------+-----------+----------+--------------+     Summary: RIGHT: - There is no evidence of deep vein thrombosis in the lower extremity.  - No cystic structure found in the popliteal fossa.  LEFT: - There is no evidence of deep vein thrombosis in the lower extremity.  - No cystic structure found in the popliteal fossa.   *See table(s) above for measurements and observations.    Preliminary         Scheduled Meds: . apixaban  5 mg Oral BID  . vitamin C  500 mg Oral Daily  . Chlorhexidine Gluconate Cloth  6 each Topical Daily  . insulin aspart  0-15 Units Subcutaneous TID WC  . Ipratropium-Albuterol  1 puff Inhalation BID  . levothyroxine  75 mcg Oral QAC breakfast  . losartan  12.5 mg Oral Daily  . mouth rinse  15 mL Mouth Rinse BID  . metoprolol succinate  25 mg Oral Daily  . polyethylene glycol  17 g Oral TID  . predniSONE  30 mg Oral Q breakfast   Followed by  . [START ON 01/27/2021] predniSONE  20 mg Oral Q breakfast   Followed by  . [START ON 01/29/2021] predniSONE  10 mg Oral Q breakfast  . senna-docusate  2 tablet Oral QHS  . sorbitol, milk of mag, mineral oil, glycerin (SMOG) enema  960 mL Rectal Once  . umeclidinium bromide  1 puff Inhalation Daily  . zinc sulfate  220 mg Oral Daily   Continuous Infusions:    LOS: 9 days    Time spent: over 30 min    Fayrene Helper, MD [facility]   To contact the attending provider between 7A-7P or the covering provider during after hours 7P-7A, please log into the web site [email or web address] and access using universal  password for that web site. If you do not have the password, please call the hospital operator.  01/25/2021, 8:15 PM

## 2021-01-25 NOTE — Progress Notes (Signed)
Cardiology Progress Note  Patient ID: Elizabeth Melton MRN: [id number] DOB: 10-30-1941 Date of Encounter: 01/25/2021  Primary Cardiologist: Ena Dawley, MD  Subjective   Chief Complaint: None.  HPI: Elevated liver enzymes on amiodarone.  Discussed with hospital medicine we will stop this.  Plan to go back to metoprolol.  She is nearing discharge.  Euvolemic.  ROS:  All other ROS reviewed and negative. Pertinent positives noted in the HPI.     Inpatient Medications  Scheduled Meds: . apixaban  5 mg Oral BID  . vitamin C  500 mg Oral Daily  . Chlorhexidine Gluconate Cloth  6 each Topical Daily  . insulin aspart  0-15 Units Subcutaneous TID WC  . Ipratropium-Albuterol  1 puff Inhalation BID  . levothyroxine  75 mcg Oral QAC breakfast  . losartan  12.5 mg Oral Daily  . mouth rinse  15 mL Mouth Rinse BID  . metoprolol succinate  25 mg Oral Daily  . polyethylene glycol  17 g Oral TID  . predniSONE  30 mg Oral Q breakfast   Followed by  . [START ON 01/27/2021] predniSONE  20 mg Oral Q breakfast   Followed by  . [START ON 01/29/2021] predniSONE  10 mg Oral Q breakfast  . senna-docusate  2 tablet Oral QHS  . sorbitol, milk of mag, mineral oil, glycerin (SMOG) enema  960 mL Rectal Once  . umeclidinium bromide  1 puff Inhalation Daily  . zinc sulfate  220 mg Oral Daily   Continuous Infusions:  PRN Meds: bisacodyl, chlorpheniramine-HYDROcodone, guaiFENesin-dextromethorphan, nitroGLYCERIN, ondansetron **OR** ondansetron (ZOFRAN) IV, oxyCODONE, simethicone   Vital Signs   Vitals:   01/24/21 1600 01/24/21 1918 01/24/21 2316 01/25/21 0318  BP: (!) 151/106 (!) 149/64 (!) 121/56 (!) 124/49  Pulse: 65 60 (!) 52 60  Resp: _0 Temp:  98.1 F (36.7 C) 97.8 F (36.6 C) 97.7 F (36.5 C)  TempSrc:  Oral    SpO2: 94% 94% 98% 96%  Weight:  78.4 kg    Height:  5' 6" (1.676 m)      Intake/Output Summary (Last 24 hours) at 01/25/2021 1237 Last data filed at 01/25/2021  1014 Gross per 24 hour  Intake 472 ml  Output 550 ml  Net -78 ml   Last 3 Weights 01/24/2021 01/16/2021 11/22/2020  Weight (lbs) 172 lb 13.5 oz 178 lb 177 lb 6.4 oz  Weight (kg) 78.4 kg 80.74 kg 80.468 kg      Telemetry  Overnight telemetry shows sinus bradycardia heart rate in the 60s, which I personally reviewed.   ECG  The most recent ECG shows sinus bradycardia heart rate 48, nonspecific ST-T changes, which I personally reviewed.   Physical Exam   Vitals:   01/24/21 1600 01/24/21 1918 01/24/21 2316 01/25/21 0318  BP: (!) 151/106 (!) 149/64 (!) 121/56 (!) 124/49  Pulse: 65 60 (!) 52 60  Resp: _1 Temp:  98.1 F (36.7 C) 97.8 F (36.6 C) 97.7 F (36.5 C)  TempSrc:  Oral    SpO2: 94% 94% 98% 96%  Weight:  78.4 kg    Height:  5' 6" (1.676 m)       Intake/Output Summary (Last 24 hours) at 01/25/2021 1237 Last data filed at 01/25/2021 1014 Gross per 24 hour  Intake 472 ml  Output 550 ml  Net -78 ml    Last 3 Weights 01/24/2021 01/16/2021 11/22/2020  Weight (lbs) 172 lb 13.5 oz 178 lb 177 lb  6.4 oz  Weight (kg) 78.4 kg 80.74 kg 80.468 kg    Body mass index is 27.9 kg/m.   General: Well nourished, well developed, in no acute distress Head: Atraumatic, normal size  Eyes: PEERLA, EOMI  Neck: Supple, no JVD Endocrine: No thryomegaly Cardiac: Normal S1, S2; RRR; no murmurs, rubs, or gallops Lungs: Clear to auscultation bilaterally, no wheezing, rhonchi or rales  Abd: Soft, nontender, no hepatomegaly  Ext: No edema, pulses 2+ Musculoskeletal: No deformities, BUE and BLE strength normal and equal Skin: Warm and dry, no rashes   Neuro: Alert and oriented to person, place, time, and situation, CNII-XII grossly intact, no focal deficits  Psych: Normal mood and affect   Labs  High Sensitivity Troponin:   Recent Labs  Lab 01/16/21 1608 01/19/21 1816 01/19/21 2004 01/20/21 0727 01/20/21 1636  TROPONINIHS 13 400* 746* 1,442* 1,209*     Cardiac EnzymesNo results  for input(s): TROPONINI in the last 168 hours. No results for input(s): TROPIPOC in the last 168 hours.  Chemistry Recent Labs  Lab 01/23/21 0239 01/24/21 0328 01/25/21 0441  NA 138 140 138  K 4.8 4.6 4.5  CL 104 105 103  CO2 _0 GLUCOSE 152* 80 138*  BUN 33* 27* 20  CREATININE 0.91 0.78 0.73  CALCIUM 8.3* 8.3* 8.3*  PROT 5.9* 5.8* 5.7*  ALBUMIN 2.5* 2.4* 2.4*  AST 61* 148* 156*  ALT 42 64* 80*  ALKPHOS 56 51 52  BILITOT 1.0 1.6* 1.4*  GFRNONAA >60 >60 >60  ANIONGAP _1 Hematology Recent Labs  Lab 01/23/21 0239 01/24/21 0328 01/25/21 0441  WBC 17.5* 11.5* 11.9*  RBC 4.64 4.67 4.68  HGB 15.2* 15.2* 15.0  HCT 47.3* 46.3* 46.1*  MCV 101.9* 99.1 98.5  MCH 32.8 32.5 32.1  MCHC 32.1 32.8 32.5  RDW 14.9 14.7 14.6  PLT 202 183 165   BNP Recent Labs  Lab 01/24/21 0322  BNP 137.5*    DDimer  Recent Labs  Lab 01/23/21 0239 01/24/21 0328 01/25/21 0441  DDIMER 1.37* 1.10* 5.03*     Radiology  DG CHEST PORT 1 VIEW  Result Date: 01/24/2021 CLINICAL DATA:  Shortness of breath EXAM: PORTABLE CHEST 1 VIEW COMPARISON:  01/16/2021 FINDINGS: Cardiac shadow is enlarged but stable. Aortic calcifications are again seen. Lungs show persistent but improved airspace opacity bilaterally. No new focal infiltrate is seen. No bony abnormality is seen. IMPRESSION: Improving airspace disease bilaterally. Electronically Signed   By: Inez Catalina M.D.   On: 01/24/2021 14:08    Cardiac Studies  TTE 01/22/2021 1. Left ventricular ejection fraction, by estimation, is 40 to 45%. The  left ventricle has mildly decreased function. The left ventricle  demonstrates global hypokinesis. There is mild concentric left ventricular  hypertrophy. Diastolic function is  indeterminant due to Afib.  2. Right ventricular systolic function is mildly reduced. The right  ventricular size is normal. There is mildly elevated pulmonary artery  systolic pressure.  3. Left atrial size was  severely dilated.  4. Right atrial size was severely dilated.  5. The mitral valve is abnormal. Moderate mitral valve regurgitation.  Moderate mitral annular calcification.  6. Tricuspid valve regurgitation is mild to moderate.  7. The aortic valve is tricuspid. There is moderate calcification of the  aortic valve. There is moderate thickening of the aortic valve. Aortic  valve regurgitation is moderate. Mild aortic valve stenosis.  8. The inferior vena cava is dilated in size with <50%  respiratory  variability, suggesting right atrial pressure of 15 mmHg.   Patient Profile  Elizabeth Melton is a 80 y.o. female with interstitial lung disease, history of submassive PE, diabetes, hyperlipidemia, HFpEF who was admitted on 01/16/2021 for COVID-19 pneumonia.  Course was complicated on 9/49/9718 for A. fib with RVR.  Assessment & Plan   1.  Atrial fibrillation -She had converted back to sinus rhythm while on amiodarone drip.  She apparently had bradycardia on the combination of a beta-blocker and amiodarone.  Her beta-blocker was held.  She remained on amiodarone.  She is remaining in sinus rhythm.  Her COVID-19 infection is improving. -Given elevated liver enzymes I would recommend to stop her amiodarone.  We will go back on metoprolol succinate 25 mg daily.  She has received a lot of amiodarone and I suspect this will help her maintain sinus rhythm. -Continue Eliquis for now. -She will have follow-up with her outpatient cardiologist.  2.  Sinus bradycardia -Had significant bradycardia on amiodarone and beta-blocker.  Stopping amiodarone due to elevated liver enzymes.  Would continue beta-blocker for now.  3.  Tachycardia mediated cardiomyopathy, EF around 45% -Suspect this is driven by A. fib with RVR. -We will continue metoprolol succinate 25 mg daily as above.  Losartan 12.5 mg daily added. -Recommend repeat echocardiogram in 3 to 6 months.  We will arrange hospital follow-up. -She is  euvolemic.  I would recommend 20 mg of Lasix as needed for increased weight gain or lower extremity edema.  [facility] will sign off.   Medication Recommendations: Stop amiodarone, continue Eliquis, continue metoprolol succinate 25 mg daily, continue losartan 12.5 mg daily, Lasix 20 mg daily as needed Other recommendations (labs, testing, etc): None Follow up as an outpatient: We will arrange outpatient follow-up with Dr. Vinie Sill  For questions or updates, please contact [facility] Please consult [email or web address] for contact info under   Time Spent with Patient: I have spent a total of 25 minutes with patient reviewing hospital notes, telemetry, EKGs, labs and examining the patient as well as establishing an assessment and plan that was discussed with the patient.  > 50% of time was spent in direct patient care.    Signed, Addison Naegeli. Audie Box, [place]  01/25/2021 12:37 PM

## 2021-01-25 NOTE — Progress Notes (Signed)
SATURATION QUALIFICATIONS: (This note is used to comply with regulatory documentation for home oxygen)  Patient Saturations on Room Air at Rest = 88-89%  Patient Saturations on Room Air while Ambulating = 83%  Patient Saturations on 2 Liters of oxygen while Ambulating = 92%  Please briefly explain why patient needs home oxygen:  Short of breath with exertion.

## 2021-01-25 NOTE — Progress Notes (Signed)
Bilateral lower extremity venous duplex has been completed. Preliminary results can be found in CV Proc through chart review.   01/25/21 2:48 PM Carlos Levering RVT

## 2021-01-26 DIAGNOSIS — J9601 Acute respiratory failure with hypoxia: Secondary | ICD-10-CM

## 2021-01-26 LAB — COMPREHENSIVE METABOLIC PANEL
ALT: 71 U/L — ABNORMAL HIGH (ref 0–44)
AST: 102 U/L — ABNORMAL HIGH (ref 15–41)
Albumin: 2.4 g/dL — ABNORMAL LOW (ref 3.5–5.0)
Alkaline Phosphatase: 56 U/L (ref 38–126)
Anion gap: 8 (ref 5–15)
BUN: 19 mg/dL (ref 8–23)
CO2: 30 mmol/L (ref 22–32)
Calcium: 8.4 mg/dL — ABNORMAL LOW (ref 8.9–10.3)
Chloride: 104 mmol/L (ref 98–111)
Creatinine, Ser: 0.63 mg/dL (ref 0.44–1.00)
GFR, Estimated: >60 mL/min (ref >60)
Glucose, Bld: 162 mg/dL — ABNORMAL HIGH (ref 70–99)
Potassium: 4.6 mmol/L (ref 3.5–5.1)
Sodium: 142 mmol/L (ref 135–145)
Total Bilirubin: 1.3 mg/dL — ABNORMAL HIGH (ref 0.3–1.2)
Total Protein: 5.7 g/dL — ABNORMAL LOW (ref 6.5–8.1)

## 2021-01-26 LAB — DIFFERENTIAL
Abs Immature Granulocytes: 0.08 10*3/uL — ABNORMAL HIGH (ref 0.00–0.07)
Basophils Absolute: 0 10*3/uL (ref 0.0–0.1)
Basophils Relative: 0 %
Eosinophils Absolute: 0 10*3/uL (ref 0.0–0.5)
Eosinophils Relative: 0 %
Immature Granulocytes: 1 %
Lymphocytes Relative: 18 %
Lymphs Abs: 2.2 10*3/uL (ref 0.7–4.0)
Monocytes Absolute: 1.1 10*3/uL — ABNORMAL HIGH (ref 0.1–1.0)
Monocytes Relative: 9 %
Neutro Abs: 8.6 10*3/uL — ABNORMAL HIGH (ref 1.7–7.7)
Neutrophils Relative %: 72 %

## 2021-01-26 LAB — GLUCOSE, CAPILLARY
Glucose-Capillary: 122 mg/dL — ABNORMAL HIGH (ref 70–99)
Glucose-Capillary: 235 mg/dL — ABNORMAL HIGH (ref 70–99)
Glucose-Capillary: 190 mg/dL — ABNORMAL HIGH (ref 70–99)
Glucose-Capillary: 268 mg/dL — ABNORMAL HIGH (ref 70–99)

## 2021-01-26 LAB — CBC
HCT: 43.6 % (ref 36.0–46.0)
Hemoglobin: 14.4 g/dL (ref 12.0–15.0)
MCH: 33 pg (ref 26.0–34.0)
MCHC: 33 g/dL (ref 30.0–36.0)
MCV: 99.8 fL (ref 80.0–100.0)
Platelets: 140 10*3/uL — ABNORMAL LOW (ref 150–400)
RBC: 4.37 MIL/uL (ref 3.87–5.11)
RDW: 14.6 % (ref 11.5–15.5)
WBC: 12 10*3/uL — ABNORMAL HIGH (ref 4.0–10.5)
nRBC: 0 % (ref 0.0–0.2)

## 2021-01-26 LAB — PHOSPHORUS: Phosphorus: 2.6 mg/dL (ref 2.5–4.6)

## 2021-01-26 LAB — D-DIMER, QUANTITATIVE: D-Dimer, Quant: 1.37 ug/mL-FEU — ABNORMAL HIGH (ref 0.00–0.50)

## 2021-01-26 LAB — FERRITIN: Ferritin: 1356 ng/mL — ABNORMAL HIGH (ref 11–307)

## 2021-01-26 LAB — MAGNESIUM: Magnesium: 2.3 mg/dL (ref 1.7–2.4)

## 2021-01-26 LAB — C-REACTIVE PROTEIN: CRP: 2.1 mg/dL — ABNORMAL HIGH (ref <1.0)

## 2021-01-26 MED ORDER — METOPROLOL SUCCINATE ER 25 MG PO TB24
12.5000 mg | ORAL_TABLET | Freq: Every day | ORAL | Status: DC
  Administered 2021-01-26 – 2021-01-27 (×2): 12.5 mg via ORAL
  Filled 2021-01-26 (×2): qty 1

## 2021-01-26 NOTE — Hospital Course (Addendum)
Elizabeth Melton is a 80 y.o. female with medical history significant of ILD (ANA positive) who was previously on nocturnal oxygen but has been off for some time, history of saddle PE recently stopped Eliquis, essential hypertension, hypothyroidism, hyperlipidemia, type 2 diabetes mellitus who presented to the ED with progressive shortness of breath and intermittent dizziness over the past week.  Patient initially thought it was related to her Eliquis, she contacted her pulmonologist who elected to have patient stop her anticoagulant.  Patient symptoms continue to progress now associated with decreased appetite, myalgias.  Patient denies any sick contacts, no recent travel and no other changes in medication regimen.  Patient specifically denies visual changes, no chest pain, no palpitations, no nausea/vomiting/diarrhea, no abdominal pain, no chills/night sweats, no paresthesias.   She was admitted for acute hypoxic respiratory failure 2/2 COVID 19 pneumonia.  Initially had planned to discharge last week, but developed a fib with RVR when working with therapy.  She became hypotensive on diltiazem and was started on amiodarone.  Amiodarone has now been d/c'd due to her elevated liver enzymes.  She was then started on Toprol.  Heart rate tolerated Toprol which was decreased some for mild bradycardia however patient was asymptomatic.

## 2021-01-26 NOTE — Progress Notes (Signed)
SATURATION QUALIFICATIONS: (This note is used to comply with regulatory documentation for home oxygen)  Patient Saturations on Room Air at Rest = 86%  Patient Saturations on Room Air while Ambulating = 80%  Patient Saturations on 4 Liters of oxygen while Ambulating = 90%  Please briefly explain why patient needs home oxygen: to maintain appropriate SaO2 levles  Philomena Doheny PT 01/26/2021  Acute Rehabilitation Services Pager [phone] Office [phone]

## 2021-01-26 NOTE — Progress Notes (Signed)
Physical Therapy Treatment Patient Details Name: Elizabeth Melton MRN: [id number] DOB: Jan 20, 1941 Today's Date: 01/26/2021    History of Present Illness Pt is 80 yo female admitted with COVID and afib RVR. Hx of PE, DM, ILD, R LE DVT    PT Comments    Pt ambulated 24' with straight cane  x 3 trials with seated rest breaks. SaO2 80% on room air walking, 90% on 4L O2 walking. Instructed pt in seated strengthening exercises. Encouraged proning, reviewed technique for incentive spirometer.  Pt is progressing with activity tolerance.    Follow Up Recommendations  Home health PT;Supervision - Intermittent     Equipment Recommendations  None recommended by PT    Recommendations for Other Services       Precautions / Restrictions Precautions Precautions: Fall Precaution Comments: monitor O2 Restrictions Weight Bearing Restrictions: No    Mobility  Bed Mobility               General bed mobility comments: up in recliner    Transfers Overall transfer level: Needs assistance Equipment used: Straight cane Transfers: Sit to/from Stand Sit to Stand: Supervision         General transfer comment: increased time, supervision for safety  Ambulation/Gait Ambulation/Gait assistance: Supervision Gait Distance (Feet): 72 Feet Assistive device: Straight cane Gait Pattern/deviations: Step-through pattern;Decreased stride length Gait velocity: decr   General Gait Details: 24' x 3 with seated rest between trials, SaO2 80% on RA walking, 90% on 4L O2 walking, VCs pursed lip breathing, 2/4 dyspnea, occaisionally reaches for support on bedrail   Stairs             Wheelchair Mobility    Modified Rankin (Stroke Patients Only)       Balance Overall balance assessment: Needs assistance Sitting-balance support: Feet supported;Bilateral upper extremity supported Sitting balance-Leahy Scale: Good     Standing balance support: Bilateral upper extremity supported;No  upper extremity supported Standing balance-Leahy Scale: Fair                              Cognition Arousal/Alertness: Awake/alert Behavior During Therapy: WFL for tasks assessed/performed Overall Cognitive Status: Within Functional Limits for tasks assessed                                        Exercises General Exercises - Lower Extremity Ankle Circles/Pumps: AROM;Both;10 reps;Seated Long Arc Quad: AROM;Seated;Both;10 reps Hip Flexion/Marching: AROM;Both;10 reps;Seated    General Comments        Pertinent Vitals/Pain Pain Assessment: No/denies pain    Home Living                      Prior Function            PT Goals (current goals can now be found in the care plan section) Acute Rehab PT Goals Patient Stated Goal: to get better and get home, see grandkids PT Goal Formulation: With patient Time For Goal Achievement: 01/31/21 Potential to Achieve Goals: Good Progress towards PT goals: Progressing toward goals    Frequency    Min 3X/week      PT Plan Current plan remains appropriate    Co-evaluation              AM-PAC PT "6 Clicks" Mobility   Outcome Measure  Help needed turning from  your back to your side while in a flat bed without using bedrails?: A Little Help needed moving from lying on your back to sitting on the side of a flat bed without using bedrails?: A Little Help needed moving to and from a bed to a chair (including a wheelchair)?: A Little Help needed standing up from a chair using your arms (e.g., wheelchair or bedside chair)?: A Little Help needed to walk in hospital room?: A Little Help needed climbing 3-5 steps with a railing? : A Little 6 Click Score: 18    End of Session Equipment Utilized During Treatment: Oxygen Activity Tolerance: Patient limited by fatigue Patient left: with call bell/phone within reach;in bed Nurse Communication: Mobility status PT Visit Diagnosis: Unsteadiness on  feet (R26.81);Difficulty in walking, not elsewhere classified (R26.2)     Time: 0034-9179 PT Time Calculation (min) (ACUTE ONLY): 33 min  Charges:  $Gait Training: 8-22 mins $Therapeutic Exercise: 8-22 mins                    Blondell Reveal Kistler PT 01/26/2021  Acute Rehabilitation Services Pager [phone] Office [phone]

## 2021-01-26 NOTE — Progress Notes (Signed)
PROGRESS NOTE    Elizabeth Melton   RUE:[id number]  DOB: 10-Oct-1941  DOA: 01/16/2021     10  PCP: Kathyrn Lass, MD  CC: SOB  Hospital Course: Elizabeth Melton is a 80 y.o. female with medical history significant of ILD (ANA positive) who was previously on nocturnal oxygen but has been off for some time, history of saddle PE recently stopped Eliquis, essential hypertension, hypothyroidism, hyperlipidemia, type 2 diabetes mellitus who presented to the ED with progressive shortness of breath and intermittent dizziness over the past week.  Patient initially thought it was related to her Eliquis, she contacted her pulmonologist who elected to have patient stop her anticoagulant.  Patient symptoms continue to progress now associated with decreased appetite, myalgias.  Patient denies any sick contacts, no recent travel and no other changes in medication regimen.  Patient specifically denies visual changes, no chest pain, no palpitations, no nausea/vomiting/diarrhea, no abdominal pain, no chills/night sweats, no paresthesias.   She was admitted for acute hypoxic respiratory failure 2/2 COVID 19 pneumonia.  Initially had planned to discharge last week, but developed a fib with RVR when working with therapy.  She became hypotensive on diltiazem and was started on amiodarone.  Amiodarone has now been d/c'd due to her elevated liver enzymes.  She was then started on Toprol.    Interval History:  No events overnight.  Anxious for hopefully going home soon when seen this morning.  Underwent walk test and had desaturation on room air requiring ongoing use of oxygen.  Social work assisting with arranging home oxygen.  Still slightly bradycardic this am but does not appear to be symptomatic.   Old records reviewed in assessment of this patient  ROS: Constitutional: negative for chills and fevers, Respiratory: negative for cough, Cardiovascular: negative for chest pain and Gastrointestinal: negative for  abdominal pain  Assessment & Plan: Atrial Fibrillation with RVR - Converted to sinus brady (2/12) afternoon - Continued sinus brady; decrease Toprol further today - TSH wnl 2/6.  Echo with grade II diastolic dysfunction.  Severely elevated PASP.   - Repeat echo 2/12 with EF 40-45%, mildly reduced RVSF, global hypokinesis - possibly tachycardia mediated  - needs repeat echo in 3 months - PRN lasix  - continue Eliquis -Outpatient follow-up with cardiology  Elevated Troponin Demand Ischemia in setting of afib with RVR and hypotension - No CP or SOB.  - Initial EKG with T wave inversion II, III, aVF, ST depression V3-V6, thought to be rate related changes. Repeat EKG appeared improved.   - Troponin peaked to 1442 - D/c aspirin.  On anticoagulation with eliquis. - 2/12 echo with decreased EF 40-45%, mildly reduce RVSF (see report) per cards - Cardiology c/s, appreciate recs - follow up with cardiology outpatient  - A1c 7.6, ldl 62  Elevated D Dimer: negative LE [place] - Continue to monitor -  on eliquis  Lightheadedness: sx resolved without intervention. BP didn't appear low.  - possibly in setting of bradycardia; Toprol adjusted  Acute hypoxic respiratory failure Covid 19 PNA - unvaccinated - still hypoxic; walk test performed on 2/16 - home O2 to be arranged - negative CT PE on 2/6 - s/p 5 days remdesivir - taper steroid course - continue IS, FV, OOB  Constipation: bowel regimen, enema  Recent saddle pulmonary embolism Patient with recent large volume thrombus including a thin saddle PE with associated right heart strain on CTA 09/05/2020. Patient was started on IV heparin at that time and PCCM and IR  consulted for TPA consideration but patient declined. Patient was on IV heparin for 48 hours and transition to Eliquis.  CT angiogram chest negative for PE. --Continue home Eliquis  T2DM A1c 7.6. doesn't appear she's on home meds. SSI, basal (will d/c basal with  hypoglycemia and tapering steroids)   Elevated LFTs - possibly related to covid vs hypotension vs cholestasis of acute illness -Hepatitis panel negative -Amiodarone discontinued - slightly improved today - repeat CMP in am  Essential hypertension Currently off antihypertensives.   Hyperlipidemia On hold as noted above  Hematuria Urine in canister is red.  11-20 RBC's on UA.  Follow culture.   Will need outpatient follow up  Interstitial lung disease History of pulmonary nodules Patient follows with pulmonology, Dr. [name] outpatient. History of ANA positive. Was previously on nocturnal oxygen, but has been off for some time. CT high-resolution chest 08/28/2018 with scattered solid pulmonary nodules, largest 7 mm basilar right upper lobe which have been stable. --Continue Combivent inhaler as above --Continue supplemental oxygen, titrate to maintain SPO2 greater than 88% --Outpatient follow-up with pulmonology on discharge  Antimicrobials: Remdesivir 01/16/2021 >> 01/20/2021  DVT prophylaxis:  apixaban (ELIQUIS) tablet 5 mg   Code Status:   Code Status: Full Code Family Communication: none  Disposition Plan: Status is: Inpatient  Remains inpatient appropriate because:IV treatments appropriate due to intensity of illness or inability to take PO and Inpatient level of care appropriate due to severity of illness   Dispo: The patient is from: Home              Anticipated d/c is to: Home              Anticipated d/c date is: 1 day              Patient currently is not medically stable to d/c.   Difficult to place patient No   Risk of unplanned readmission score: Unplanned Admission- Pilot do not use: 18.91   Objective: Blood pressure 137/74, pulse 88, temperature 98.7 F (37.1 C), temperature source Oral, resp. rate (!) 22, height _0  (1.676 m), weight 78.4 kg, SpO2 96 %.  Examination: General appearance: alert, cooperative and no distress Head:  Normocephalic, without obvious abnormality, atraumatic Eyes: EOMI Lungs: Coarse breath sounds bilaterally, no wheezing Heart: Bradycardic, regular rhythm Abdomen: normal findings: bowel sounds normal and soft, non-tender Extremities: No edema Skin: mobility and turgor normal Neurologic: Grossly normal  Consultants:   Cardiology  Procedures:     Data Reviewed: I have personally reviewed following labs and imaging studies Results for orders placed or performed during the hospital encounter of 01/16/21 (from the past 24 hour(s))  Glucose, capillary     Status: Abnormal   Collection Time: 01/25/21  4:52 PM  Result Value Ref Range   Glucose-Capillary 168 (H) 70 - 99 mg/dL  Urinalysis, Routine w reflex microscopic Urine, Clean Catch     Status: Abnormal   Collection Time: 01/25/21  5:00 PM  Result Value Ref Range   Color, Urine AMBER (A) YELLOW   APPearance CLOUDY (A) CLEAR   Specific Gravity, Urine 1.027 1.005 - 1.030   pH 6.0 5.0 - 8.0   Glucose, UA NEGATIVE NEGATIVE mg/dL   Hgb urine dipstick MODERATE (A) NEGATIVE   Bilirubin Urine NEGATIVE NEGATIVE   Ketones, ur NEGATIVE NEGATIVE mg/dL   Protein, ur 30 (A) NEGATIVE mg/dL   Nitrite NEGATIVE NEGATIVE   Leukocytes,Ua TRACE (A) NEGATIVE   RBC / HPF 11-20 0 - 5  RBC/hpf   WBC, UA 11-20 0 - 5 WBC/hpf   Bacteria, UA MANY (A) NONE SEEN   Squamous Epithelial / LPF 0-5 0 - 5   Mucus PRESENT   Glucose, capillary     Status: Abnormal   Collection Time: 01/25/21  9:38 PM  Result Value Ref Range   Glucose-Capillary 281 (H) 70 - 99 mg/dL  Comprehensive metabolic panel     Status: Abnormal   Collection Time: 01/26/21  3:49 AM  Result Value Ref Range   Sodium 142 135 - 145 mmol/L   Potassium 4.6 3.5 - 5.1 mmol/L   Chloride 104 98 - 111 mmol/L   CO2 30 22 - 32 mmol/L   Glucose, Bld 162 (H) 70 - 99 mg/dL   BUN 19 8 - 23 mg/dL   Creatinine, Ser 0.63 0.44 - 1.00 mg/dL   Calcium 8.4 (L) 8.9 - 10.3 mg/dL   Total Protein 5.7 (L) 6.5 -  8.1 g/dL   Albumin 2.4 (L) 3.5 - 5.0 g/dL   AST 102 (H) 15 - 41 U/L   ALT 71 (H) 0 - 44 U/L   Alkaline Phosphatase 56 38 - 126 U/L   Total Bilirubin 1.3 (H) 0.3 - 1.2 mg/dL   GFR, Estimated >60 >60 mL/min   Anion gap 8 5 - 15  Magnesium     Status: None   Collection Time: 01/26/21  3:49 AM  Result Value Ref Range   Magnesium 2.3 1.7 - 2.4 mg/dL  Phosphorus     Status: None   Collection Time: 01/26/21  3:49 AM  Result Value Ref Range   Phosphorus 2.6 2.5 - 4.6 mg/dL  C-reactive protein     Status: Abnormal   Collection Time: 01/26/21  3:49 AM  Result Value Ref Range   CRP 2.1 (H) <1.0 mg/dL  D-dimer, quantitative (not at [facility])     Status: Abnormal   Collection Time: 01/26/21  3:49 AM  Result Value Ref Range   D-Dimer, Quant 1.37 (H) 0.00 - 0.50 ug/mL-FEU  Ferritin     Status: Abnormal   Collection Time: 01/26/21  3:49 AM  Result Value Ref Range   Ferritin 1,356 (H) 11 - 307 ng/mL  CBC     Status: Abnormal   Collection Time: 01/26/21  7:23 AM  Result Value Ref Range   WBC 12.0 (H) 4.0 - 10.5 K/uL   RBC 4.37 3.87 - 5.11 MIL/uL   Hemoglobin 14.4 12.0 - 15.0 g/dL   HCT 43.6 36.0 - 46.0 %   MCV 99.8 80.0 - 100.0 fL   MCH 33.0 26.0 - 34.0 pg   MCHC 33.0 30.0 - 36.0 g/dL   RDW 14.6 11.5 - 15.5 %   Platelets 140 (L) 150 - 400 K/uL   nRBC 0.0 0.0 - 0.2 %  Differential     Status: Abnormal   Collection Time: 01/26/21  7:23 AM  Result Value Ref Range   Neutrophils Relative % 72 %   Neutro Abs 8.6 (H) 1.7 - 7.7 K/uL   Lymphocytes Relative 18 %   Lymphs Abs 2.2 0.7 - 4.0 K/uL   Monocytes Relative 9 %   Monocytes Absolute 1.1 (H) 0.1 - 1.0 K/uL   Eosinophils Relative 0 %   Eosinophils Absolute 0.0 0.0 - 0.5 K/uL   Basophils Relative 0 %   Basophils Absolute 0.0 0.0 - 0.1 K/uL   Immature Granulocytes 1 %   Abs Immature Granulocytes 0.08 (H) 0.00 - 0.07 K/uL  Glucose,  capillary     Status: Abnormal   Collection Time: 01/26/21  8:19 AM  Result Value Ref Range    Glucose-Capillary 122 (H) 70 - 99 mg/dL  Glucose, capillary     Status: Abnormal   Collection Time: 01/26/21 11:45 AM  Result Value Ref Range   Glucose-Capillary 235 (H) 70 - 99 mg/dL    Recent Results (from the past 240 hour(s))  MRSA PCR Screening     Status: None   Collection Time: 01/19/21  6:58 PM   Specimen: Nasal Mucosa; Nasopharyngeal  Result Value Ref Range Status   MRSA by PCR NEGATIVE NEGATIVE Final    Comment:        The GeneXpert MRSA Assay (FDA approved for NASAL specimens only), is one component of a comprehensive MRSA colonization surveillance program. It is not intended to diagnose MRSA infection nor to guide or monitor treatment for MRSA infections. Performed at [facility], [address] [place],  [address]      Radiology Studies: VAS [place] LOWER EXTREMITY VENOUS (DVT)  Result Date: 01/25/2021  Lower Venous DVT Study Indications: Elevated Ddimer.  Risk Factors: COVID 19 positive. Comparison Study: No prior studies. Performing Technologist: Oliver Hum RVT  Examination Guidelines: A complete evaluation includes B-mode imaging, spectral Doppler, color Doppler, and power Doppler as needed of all accessible portions of each vessel. Bilateral testing is considered an integral part of a complete examination. Limited examinations for reoccurring indications may be performed as noted. The reflux portion of the exam is performed with the patient in reverse Trendelenburg.  +---------+---------------+---------+-----------+----------+--------------+ RIGHT    CompressibilityPhasicitySpontaneityPropertiesThrombus Aging +---------+---------------+---------+-----------+----------+--------------+ CFV      Full           Yes      Yes                                 +---------+---------------+---------+-----------+----------+--------------+ SFJ      Full                                                         +---------+---------------+---------+-----------+----------+--------------+ FV Prox  Full                                                        +---------+---------------+---------+-----------+----------+--------------+ FV Mid   Full                                                        +---------+---------------+---------+-----------+----------+--------------+ FV DistalFull                                                        +---------+---------------+---------+-----------+----------+--------------+ PFV      Full                                                        +---------+---------------+---------+-----------+----------+--------------+  POP      Full           Yes      Yes                                 +---------+---------------+---------+-----------+----------+--------------+ PTV      Full                                                        +---------+---------------+---------+-----------+----------+--------------+ PERO     Full                                                        +---------+---------------+---------+-----------+----------+--------------+   +---------+---------------+---------+-----------+----------+--------------+ LEFT     CompressibilityPhasicitySpontaneityPropertiesThrombus Aging +---------+---------------+---------+-----------+----------+--------------+ CFV      Full           Yes      Yes                                 +---------+---------------+---------+-----------+----------+--------------+ SFJ      Full                                                        +---------+---------------+---------+-----------+----------+--------------+ FV Prox  Full                                                        +---------+---------------+---------+-----------+----------+--------------+ FV Mid   Full                                                         +---------+---------------+---------+-----------+----------+--------------+ FV DistalFull                                                        +---------+---------------+---------+-----------+----------+--------------+ PFV      Full                                                        +---------+---------------+---------+-----------+----------+--------------+ POP      Full           Yes      Yes                                 +---------+---------------+---------+-----------+----------+--------------+  PTV      Full                                                        +---------+---------------+---------+-----------+----------+--------------+ PERO     Full                                                        +---------+---------------+---------+-----------+----------+--------------+     Summary: RIGHT: - There is no evidence of deep vein thrombosis in the lower extremity.  - No cystic structure found in the popliteal fossa.  LEFT: - There is no evidence of deep vein thrombosis in the lower extremity.  - No cystic structure found in the popliteal fossa.  *See table(s) above for measurements and observations.    Preliminary    VAS [place] LOWER EXTREMITY VENOUS (DVT)      DG CHEST PORT 1 VIEW  Final Result    CT Angio Chest PE W and/or Wo Contrast  Final Result    DG Chest 2 View  Final Result      Scheduled Meds: . apixaban  5 mg Oral BID  . vitamin C  500 mg Oral Daily  . Chlorhexidine Gluconate Cloth  6 each Topical Daily  . insulin aspart  0-15 Units Subcutaneous TID WC  . Ipratropium-Albuterol  1 puff Inhalation BID  . levothyroxine  75 mcg Oral QAC breakfast  . losartan  12.5 mg Oral Daily  . mouth rinse  15 mL Mouth Rinse BID  . metoprolol succinate  12.5 mg Oral Daily  . polyethylene glycol  17 g Oral TID  . [START ON 01/27/2021] predniSONE  20 mg Oral Q breakfast   Followed by  . [START ON 01/29/2021] predniSONE  10 mg Oral Q breakfast  .  senna-docusate  2 tablet Oral QHS  . umeclidinium bromide  1 puff Inhalation Daily  . zinc sulfate  220 mg Oral Daily   PRN Meds: bisacodyl, chlorpheniramine-HYDROcodone, guaiFENesin-dextromethorphan, nitroGLYCERIN, ondansetron **OR** ondansetron (ZOFRAN) IV, oxyCODONE, simethicone Continuous Infusions:   LOS: 10 days  Time spent: Greater than 50% of the 35 minute visit was spent in counseling/coordination of care for the patient as laid out in the A&P.   [name], MD [facility] 01/26/2021, 2:49 PM

## 2021-01-26 NOTE — Progress Notes (Signed)
Inpatient Diabetes Program Recommendations  AACE/ADA: New Consensus Statement on Inpatient Glycemic Control (2015)  Target Ranges:  Prepandial:   less than 140 mg/dL      Peak postprandial:   less than 180 mg/dL (1-2 hours)      Critically ill patients:  140 - 180 mg/dL   Lab Results  Component Value Date   GLUCAP 235 (H) 01/26/2021   HGBA1C 7.6 (H) 01/16/2021    Review of Glycemic Control Results for Elizabeth Melton, Elizabeth Melton (MRN [id number]) as of 01/26/2021 12:05  Ref. Range 01/25/2021 07:19 01/25/2021 11:33 01/25/2021 16:52 01/25/2021 21:38 01/26/2021 08:19 01/26/2021 11:45  Glucose-Capillary Latest Ref Range: 70 - 99 mg/dL 116 (H) 211 (H) 168 (H) 281 (H) 122 (H) 235 (H)    Inpatient Diabetes Program Recommendations:    Novolog 3 units TID with meals if eats at least 50%.  Will continue to follow while inpatient.  Thank you, Reche Dixon, RN, BSN Diabetes Coordinator Inpatient Diabetes Program [phone] (team pager from 8a-5p)

## 2021-01-27 LAB — GLUCOSE, CAPILLARY
Glucose-Capillary: 130 mg/dL — ABNORMAL HIGH (ref 70–99)
Glucose-Capillary: 314 mg/dL — ABNORMAL HIGH (ref 70–99)

## 2021-01-27 LAB — CBC WITH DIFFERENTIAL/PLATELET
Abs Immature Granulocytes: 0.07 10*3/uL (ref 0.00–0.07)
Basophils Absolute: 0 10*3/uL (ref 0.0–0.1)
Basophils Relative: 0 %
Eosinophils Absolute: 0 10*3/uL (ref 0.0–0.5)
Eosinophils Relative: 0 %
HCT: 43.3 % (ref 36.0–46.0)
Hemoglobin: 14.2 g/dL (ref 12.0–15.0)
Immature Granulocytes: 1 %
Lymphocytes Relative: 17 %
Lymphs Abs: 2 10*3/uL (ref 0.7–4.0)
MCH: 32.9 pg (ref 26.0–34.0)
MCHC: 32.8 g/dL (ref 30.0–36.0)
MCV: 100.2 fL — ABNORMAL HIGH (ref 80.0–100.0)
Monocytes Absolute: 1.1 10*3/uL — ABNORMAL HIGH (ref 0.1–1.0)
Monocytes Relative: 10 %
Neutro Abs: 8.5 10*3/uL — ABNORMAL HIGH (ref 1.7–7.7)
Neutrophils Relative %: 72 %
Platelets: 136 10*3/uL — ABNORMAL LOW (ref 150–400)
RBC: 4.32 MIL/uL (ref 3.87–5.11)
RDW: 14.6 % (ref 11.5–15.5)
WBC: 11.7 10*3/uL — ABNORMAL HIGH (ref 4.0–10.5)
nRBC: 0 % (ref 0.0–0.2)

## 2021-01-27 LAB — BASIC METABOLIC PANEL
Anion gap: 5 (ref 5–15)
BUN: 18 mg/dL (ref 8–23)
CO2: 30 mmol/L (ref 22–32)
Calcium: 8.3 mg/dL — ABNORMAL LOW (ref 8.9–10.3)
Chloride: 102 mmol/L (ref 98–111)
Creatinine, Ser: 0.89 mg/dL (ref 0.44–1.00)
GFR, Estimated: >60 mL/min (ref >60)
Glucose, Bld: 140 mg/dL — ABNORMAL HIGH (ref 70–99)
Potassium: 4.7 mmol/L (ref 3.5–5.1)
Sodium: 137 mmol/L (ref 135–145)

## 2021-01-27 LAB — MAGNESIUM: Magnesium: 2.3 mg/dL (ref 1.7–2.4)

## 2021-01-27 MED ORDER — LOSARTAN POTASSIUM 25 MG PO TABS: 12.5000 mg | ORAL_TABLET | Freq: Every day | ORAL | 3 refills | Status: DC

## 2021-01-27 MED ORDER — METOPROLOL SUCCINATE ER 25 MG PO TB24: 12.5000 mg | ORAL_TABLET | Freq: Every day | ORAL | 3 refills | Status: DC

## 2021-01-27 MED ORDER — PREDNISONE 10 MG PO TABS: 10.0000 mg | ORAL_TABLET | Freq: Every day | ORAL | 0 refills | Status: AC

## 2021-01-27 NOTE — Discharge Summary (Signed)
Physician Discharge Summary   Elizabeth Melton QVZ:[id number] DOB: [birth year] DOA: 01/16/2021  PCP: Kathyrn Lass, MD  Admit date: 01/16/2021 Discharge date: 01/27/2021  Admitted From: home Disposition:  home Discharging physician: [name], MD  Recommendations for Outpatient Follow-up:  1. Follow-up with cardiology 2. Need repeat echo in approximately 3 months 3. Follow-up with pulmonology regarding nodules noted on CT.  Patient sees Dr. [name] 4. Repeat UA given hematuria noted  Equipment/Devices: Home oxygen arranged at discharge  Patient discharged to home in Discharge Condition: stable Risk of unplanned readmission score: Unplanned Admission- Pilot do not use: 19.57  CODE STATUS: Full Diet recommendation:  Diet Orders (From admission, onward)    Start     Ordered   01/27/21 0000  Diet - low sodium heart healthy        01/27/21 1035   01/16/21 1655  Diet heart healthy/carb modified Room service appropriate? Yes; Fluid consistency: Thin  Diet effective now       Question Answer Comment  Diet-HS Snack? Nothing   Room service appropriate? Yes   Fluid consistency: Thin      01/16/21 1654          Hospital Course: Elizabeth GWYNN is a 80 y.o. female with medical history significant of ILD (ANA positive) who was previously on nocturnal oxygen but has been off for some time, history of saddle PE recently stopped Eliquis, essential hypertension, hypothyroidism, hyperlipidemia, type 2 diabetes mellitus who presented to the ED with progressive shortness of breath and intermittent dizziness over the past week.  Patient initially thought it was related to her Eliquis, she contacted her pulmonologist who elected to have patient stop her anticoagulant.  Patient symptoms continue to progress now associated with decreased appetite, myalgias.  Patient denies any sick contacts, no recent travel and no other changes in medication regimen.  Patient specifically denies visual changes,  no chest pain, no palpitations, no nausea/vomiting/diarrhea, no abdominal pain, no chills/night sweats, no paresthesias.   She was admitted for acute hypoxic respiratory failure 2/2 COVID 19 pneumonia.  Initially had planned to discharge last week, but developed a fib with RVR when working with therapy.  She became hypotensive on diltiazem and was started on amiodarone.  Amiodarone has now been d/c'd due to her elevated liver enzymes.  She was then started on Toprol.  Heart rate tolerated Toprol which was decreased some for mild bradycardia however patient was asymptomatic.  Atrial Fibrillation with RVR - Converted to sinus brady (2/12) afternoon - TSH wnl 2/6. Echo with grade II diastolic dysfunction. Severely elevated PASP.  - Repeat echo 2/12 with EF 40-45%, mildly reduced RVSF, global hypokinesis - possibly tachycardia mediated  -Toprol 12.5 mg daily at discharge, heart rate approximately 55-60 bpm and asymptomatic - needs repeat echo in 3 months - PRN lasix  - continue Eliquis -Outpatient follow-up with cardiology  Elevated Troponin Demand Ischemia in setting of afib with RVR and hypotension - No CP or SOB.  - Initial EKG with T wave inversion II, III, aVF, ST depression V3-V6, thought to be rate related changes. Repeat EKG appeared improved.  - Troponin peaked to 1442 - D/c aspirin. On anticoagulation with eliquis. - 2/12 echo with decreased EF 40-45%, mildly reduce RVSF (see report) per cards - Cardiology c/s, appreciate recs - follow up with cardiology outpatient  - A1c 7.6, ldl 62  Elevated D Dimer: negative LE [place] -  on eliquis  Lightheadedness: sx resolved without intervention. BP didn't appear low.  -  possibly in setting of bradycardia; Toprol adjusted  Acute hypoxic respiratory failure Covid 19 PNA - unvaccinated - still hypoxic; walk test performed on 2/16 - home O2 to be arranged: 2L - negative CT PE on 2/6 - s/p 5 days remdesivir - taper steroid course  at discharge   Constipation:bowel regimen, enema  Recent saddle pulmonary embolism Patient with recent large volume thrombus including a thin saddle PE with associated right heart strain on CTA 09/05/2020. Patient was started on IV heparin at that time and PCCM and IR consulted for TPA consideration but patient declined. Patient was on IV heparin for 48 hours and transition to Eliquis.  CT angiogram chest negative for PE. --Continue home Eliquis  T2DM A1c 7.6. doesn't appear she's on home meds. SSI, basal (will d/c basal with hypoglycemia and tapering steroids)   Elevated LFTs - possibly related to covid vs hypotension vs cholestasis of acute illness -Hepatitis panel negative -Amiodarone discontinued - downtrending at discharge   Essential hypertension - losartan continued at discharge    Hyperlipidemia - resumed statin at d/c  Hematuria Urine in canister is red. 11-20 RBC's on UA. Follow culture.  Will need outpatient follow up  Interstitial lung disease History of pulmonary nodules Patient follows with pulmonology, Dr. [name] outpatient. History of ANA positive. Was previously on nocturnal oxygen, but has been off for some time. CT high-resolution chest 08/28/2018 with scattered solid pulmonary nodules, largest 7 mm basilar right upper lobe which have been stable. --Continue Combivent inhaler as above --Continue supplemental oxygen, titrate to maintain SPO2 greater than 88% --Outpatient follow-up with pulmonology on discharge  The patient's chronic medical conditions were treated accordingly per the patient's home medication regimen except as noted.  On day of discharge, patient was felt deemed stable for discharge. Patient/family member advised to call PCP or come back to ER if needed.   Principal Diagnosis: Pneumonia due to COVID-19 virus  Discharge Diagnoses: Active Hospital Problems   Diagnosis Date Noted  . Pneumonia due to COVID-19 virus  01/16/2021  . Atrial fibrillation with RVR ([place])   . Demand ischemia ([place])   . Acute respiratory failure with hypoxia ([place]) 09/06/2020  . 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[place]) 09/05/2020  . ILD (interstitial lung disease) ([place]) 10/23/2019  . Essential (primary) hypertension 09/18/2018  . Hypothyroidism   . Hyperlipidemia   . Diabetes mellitus without complication [facility])     Resolved Hospital Problems  No resolved problems to display.    Discharge Instructions    Diet - low sodium heart healthy   Complete by: As directed    Increase activity slowly   Complete by: As directed      Allergies as of 01/27/2021   No Known Allergies     Medication List    TAKE these medications   acetaminophen 500 MG tablet Commonly known as: TYLENOL Take 500 mg by mouth daily as needed for headache (pain).   apixaban 5 MG Tabs tablet Commonly known as: ELIQUIS Take 1 tablet (5 mg total) by mouth 2 (two) times daily.   b complex vitamins tablet Take 1 tablet by mouth daily.   Calcium + D3 600-200 MG-UNIT Tabs Take 2 tablets by mouth daily.   cholecalciferol 25 MCG (1000 UNIT) tablet Commonly known as: VITAMIN D3 Take 1,000 Units by mouth daily.   FISH OIL PO Take 1 capsule by mouth daily.   glucose blood test strip 1 each by Other route as needed for other (blood sugar).   levothyroxine 75  MCG tablet Commonly known as: SYNTHROID Take 75 mcg by mouth daily before breakfast.   losartan 25 MG tablet Commonly known as: COZAAR Take 0.5 tablets (12.5 mg total) by mouth daily. Start taking on: January 28, 2021   lovastatin 40 MG tablet Commonly known as: MEVACOR Take 40 mg by mouth at bedtime.   metoprolol succinate 25 MG 24 hr tablet Commonly known as: TOPROL-XL Take 0.5 tablets (12.5 mg total) by mouth daily. Start taking on: January 28, 2021   predniSONE 10 MG tablet Commonly known as: DELTASONE Take 1 tablet (10 mg total) by mouth daily with breakfast for 3  days. Start taking on: January 28, 2021   Smart Sense Thin Lancets 26G Misc 1 each by Does not apply route 2 (two) times daily.   Stiolto Respimat 2.5-2.5 MCG/ACT Aers Generic drug: Tiotropium Bromide-Olodaterol Inhale 2 puffs into the lungs daily.   True Metrix Meter w/Device Kit 1 each by Other route in the morning and at bedtime.            Durable Medical Equipment  (From admission, onward)         Start     Ordered   01/26/21 1111  For home use only DME oxygen  Once       Question Answer Comment  Length of Need 6 Months   Mode or (Route) Nasal cannula   Liters per Minute 2   Frequency Continuous (stationary and portable oxygen unit needed)   Oxygen delivery system Gas      01/26/21 1110          Follow-up Information    [facility] [place] Follow up.   Specialty: Home Health Services Why: This is the agency that will be providing physical therapy in the home. Contact information: [address] [place] [address] [phone]        [name], MD Follow up.   Specialties: Cardiology, Radiology Why: Hospital follow-up with Cardiology scheduled for 02/22/2021 at 10:00am. Please arrive 15 minutes early for check-in. If this date/time does not work for you, please call our office to reschedule. Contact information: [address] [place] [address] [place] [facility] Follow up.   Why: This is your oxygen company Contact information: [place] [address] [phone]              No Known Allergies  Consultations: Cardiology  Discharge Exam: BP 102/67   Pulse (!) 54   Temp 98.1 F (36.7 C)   Resp 18   Ht _0  (1.676 m)   Wt 78.4 kg   SpO2 96%   BMI 27.90 kg/m  General appearance: alert, cooperative and no distress Head: Normocephalic, without obvious abnormality, atraumatic Eyes: EOMI Lungs: Coarse breath sounds bilaterally, no  wheezing Heart: Bradycardic, regular rhythm Abdomen: normal findings: bowel sounds normal and soft, non-tender Extremities: No edema Skin: mobility and turgor normal Neurologic: Grossly normal   The results of significant diagnostics from this hospitalization (including imaging, microbiology, ancillary and laboratory) are listed below for reference.   Microbiology: Recent Results (from the past 240 hour(s))  MRSA PCR Screening     Status: None   Collection Time: 01/19/21  6:58 PM   Specimen: Nasal Mucosa; Nasopharyngeal  Result Value Ref Range Status   MRSA by PCR NEGATIVE NEGATIVE Final    Comment:        The GeneXpert  MRSA Assay (FDA approved for NASAL specimens only), is one component of a comprehensive MRSA colonization surveillance program. It is not intended to diagnose MRSA infection nor to guide or monitor treatment for MRSA infections. Performed at [facility], [place] [address] [place], [place] [address]      Labs: BNP (last 3 results) Recent Labs    09/05/20 1454 01/16/21 1408 01/24/21 0322  BNP 850.0* 115.0* 194.7*   Basic Metabolic Panel: Recent Labs  Lab 01/22/21 0248 01/23/21 0239 01/24/21 0328 01/25/21 0441 01/26/21 0349 01/27/21 0337  NA 135 138 140 138 142 137  K 4.7 4.8 4.6 4.5 4.6 4.7  CL 100 104 105 103 104 102  CO2 _0 GLUCOSE 348* 152* 80 138* 162* 140*  BUN 29* 33* 27* _1 CREATININE 0.90 0.91 0.78 0.73 0.63 0.89  CALCIUM 8.0* 8.3* 8.3* 8.3* 8.4* 8.3*  MG 2.4 2.5* 2.2 2.3 2.3 2.3  PHOS 2.8 3.4 3.3 3.0 2.6  --    Liver Function Tests: Recent Labs  Lab 01/22/21 0248 01/23/21 0239 01/24/21 0328 01/25/21 0441 01/26/21 0349  AST 42* 61* 148* 156* 102*  ALT 39 42 64* 80* 71*  ALKPHOS 53 56 51 52 56  BILITOT 0.8 1.0 1.6* 1.4* 1.3*  PROT 5.9* 5.9* 5.8* 5.7* 5.7*  ALBUMIN 2.5* 2.5* 2.4* 2.4* 2.4*   No results for input(s): LIPASE, AMYLASE in the last 168 hours. No results for input(s):  AMMONIA in the last 168 hours. CBC: Recent Labs  Lab 01/23/21 0239 01/24/21 0328 01/25/21 0441 01/26/21 0723 01/27/21 0337  WBC 17.5* 11.5* 11.9* 12.0* 11.7*  NEUTROABS 14.6* 9.0* 9.4* 8.6* 8.5*  HGB 15.2* 15.2* 15.0 14.4 14.2  HCT 47.3* 46.3* 46.1* 43.6 43.3  MCV 101.9* 99.1 98.5 99.8 100.2*  PLT 202 183 165 140* 136*   Cardiac Enzymes: No results for input(s): CKTOTAL, CKMB, CKMBINDEX, TROPONINI in the last 168 hours. BNP: Invalid input(s): POCBNP CBG: Recent Labs  Lab 01/26/21 1145 01/26/21 1724 01/26/21 2122 01/27/21 0749 01/27/21 1140  GLUCAP 235* 190* 268* 130* 314*   D-Dimer Recent Labs    01/25/21 0441 01/26/21 0349  DDIMER 5.03* 1.37*   Hgb A1c No results for input(s): HGBA1C in the last 72 hours. Lipid Profile No results for input(s): CHOL, HDL, LDLCALC, TRIG, CHOLHDL, LDLDIRECT in the last 72 hours. Thyroid function studies No results for input(s): TSH, T4TOTAL, T3FREE, THYROIDAB in the last 72 hours.  Invalid input(s): FREET3 Anemia work up Recent Labs    01/25/21 0441 01/26/21 0349  FERRITIN 1,708* 1,356*   Urinalysis    Component Value Date/Time   COLORURINE AMBER (A) 01/25/2021 1700   APPEARANCEUR CLOUDY (A) 01/25/2021 1700   LABSPEC 1.027 01/25/2021 1700   PHURINE 6.0 01/25/2021 1700   GLUCOSEU NEGATIVE 01/25/2021 1700   HGBUR MODERATE (A) 01/25/2021 1700   BILIRUBINUR NEGATIVE 01/25/2021 1700   KETONESUR NEGATIVE 01/25/2021 1700   PROTEINUR 30 (A) 01/25/2021 1700   NITRITE NEGATIVE 01/25/2021 1700   LEUKOCYTESUR TRACE (A) 01/25/2021 1700   Sepsis Labs Invalid input(s): PROCALCITONIN,  WBC,  LACTICIDVEN Microbiology Recent Results (from the past 240 hour(s))  MRSA PCR Screening     Status: None   Collection Time: 01/19/21  6:58 PM   Specimen: Nasal Mucosa; Nasopharyngeal  Result Value Ref Range Status   MRSA by PCR NEGATIVE NEGATIVE Final    Comment:        The GeneXpert MRSA Assay (FDA approved for  NASAL specimens only),  is one component of a comprehensive MRSA colonization surveillance program. It is not intended to diagnose MRSA infection nor to guide or monitor treatment for MRSA infections. Performed at [facility], [place] [address], [place], [place] [address]     Procedures/Studies: DG Chest 2 View  Result Date: 01/16/2021 CLINICAL DATA:  Shortness of breath. EXAM: CHEST - 2 VIEW COMPARISON:  September 07, 2020. FINDINGS: Stable cardiomegaly. No pneumothorax or pleural effusion is noted. Stable diffuse interstitial densities are noted which may represent chronic interstitial lung disease, but acute superimposed edema or inflammation cannot be excluded. Bony thorax is unremarkable. IMPRESSION: Stable diffuse interstitial densities are noted which may represent chronic interstitial lung disease, but acute superimposed edema or inflammation cannot be excluded. Aortic Atherosclerosis (ICD10-I70.0). Electronically Signed   By: Marijo Conception M.D.   On: 01/16/2021 13:47   CT Angio Chest PE W and/or Wo Contrast  Result Date: 01/16/2021 CLINICAL DATA:  COVID.  History of ILD EXAM: CT ANGIOGRAPHY CHEST WITH CONTRAST TECHNIQUE: Multidetector CT imaging of the chest was performed using the standard protocol during bolus administration of intravenous contrast. Multiplanar CT image reconstructions and MIPs were obtained to evaluate the vascular anatomy. CONTRAST:  199m OMNIPAQUE IOHEXOL 350 MG/ML SOLN COMPARISON:  September 05, 2020, October 20, 2019 FINDINGS: Cardiovascular: Limited evaluation of the bases secondary to respiratory motion. Satisfactory opacification of the pulmonary arteries to the segmental level. No evidence of pulmonary embolism. Heart is enlarged. No pericardial effusion. Calcifications of the aortic valve. Atherosclerotic calcifications of the aorta. Predominately LEFT-sided coronary artery atherosclerotic calcifications. Mediastinum/Nodes: Thyroid is unremarkable. Calcified  subcarinal lymph node consistent with sequela of prior granulomatous infection. Revisualization of mediastinal and hilar adenopathy. Lymph node borders are difficult to ascertain. Pretracheal lymph node measures 15 mm in the short axis, previously 9 mm in 2020 (series 5, image 57). RIGHT infrahilar lymph node measures 12 mm in the short axis (series 5, image 116). Preaortic lymph node measures 8 mm in the short axis, previously 5 mm in 2020 (series 4, image 41). Lungs/Pleura: No pneumothorax. Subpleural reticulation, traction bronchiectasis and honeycombing are similar in comparison to prior study from 2020. However there is increased superimposed ill-defined ground-glass opacities and interlobular septal thickening in comparison to prior. Ground-glass opacities are in a predominantly peripheral distribution. Increased bronchial wall thickening in comparison to prior. Debris within the trachea. More focal nodular opacity of the RIGHT upper lobe along the fissure measures 7 by 7 mm, unchanged in comparison to prior (series 6, image 65). Upper Abdomen: Small hiatal hernia.  No acute findings. Musculoskeletal: Degenerative changes of the thoracic spine. Review of the MIP images confirms the above findings. IMPRESSION: 1. No evidence of acute pulmonary embolism. 2. Increased superimposed ill-defined ground-glass opacities and interlobular septal thickening in comparison to prior study from 2020. Findings are consistent with superimposed COVID 19 infection on a background of interstitial lung disease. 3. Mediastinal and hilar adenopathy, likely reactive. Aortic Atherosclerosis (ICD10-I70.0). Electronically Signed   By: SValentino SaxonMD   On: 01/16/2021 16:50   DG CHEST PORT 1 VIEW  Result Date: 01/24/2021 CLINICAL DATA:  Shortness of breath EXAM: PORTABLE CHEST 1 VIEW COMPARISON:  01/16/2021 FINDINGS: Cardiac shadow is enlarged but stable. Aortic calcifications are again seen. Lungs show persistent but improved  airspace opacity bilaterally. No new focal infiltrate is seen. No bony abnormality is seen. IMPRESSION: Improving airspace disease bilaterally. Electronically Signed   By: MInez CatalinaM.D.   On: 01/24/2021  14:08   ECHOCARDIOGRAM COMPLETE  Result Date: 01/07/2021    ECHOCARDIOGRAM REPORT   Patient Name:   NAIRA STANDIFORD Date of Exam: 01/07/2021 Medical Rec #:  [id number]          Height:       66.0 in Accession #:    [id number]         Weight:       177.4 lb Date of Birth:  1941/07/03          BSA:          1.900 m Patient Age:    80 years           BP:           167/68 mmHg Patient Gender: F                  HR:           73 bpm. Exam Location:  Inpatient Procedure: 2D Echo Indications:    Pulmonary hypertension 416.8 / I27.2  History:        Patient has prior history of Echocardiogram examinations, most                 recent 09/06/2020. Risk Factors:Hypertension, Current Smoker,                 Diabetes and Dyslipidemia. Pulmonary Embolism.  Sonographer:    Leavy Cella Referring Phys: [id number] [place]  1. Left ventricular ejection fraction, by estimation, is 60 to 65%. The left ventricle has normal function. The left ventricle has no regional wall motion abnormalities. There is mild left ventricular hypertrophy. Left ventricular diastolic parameters are consistent with Grade II diastolic dysfunction (pseudonormalization). Elevated left ventricular end-diastolic pressure. The E/e' is 24.  2. Right ventricular systolic function is normal. The right ventricular size is normal. There is severely elevated pulmonary artery systolic pressure. The estimated right ventricular systolic pressure is 63.8 mmHg.  3. Left atrial size was severely dilated.  4. Right atrial size was mildly dilated.  5. The mitral valve is grossly normal. Trivial mitral valve regurgitation.  6. The aortic valve is tricuspid. Aortic valve regurgitation is mild. Mild aortic valve stenosis. Aortic valve area, by VTI  measures 1.71 cm. Aortic valve mean gradient measures 8.0 mmHg. Aortic valve Vmax measures 2.17 m/s.  7. The inferior vena cava is normal in size with greater than 50% respiratory variability, suggesting right atrial pressure of 3 mmHg. Comparison(s): Changes from prior study are noted. 09/06/20: LVEF 60-65%, RVSP 70 mmHG, severely dilated and hypokinetic RV. In my review of the previous study, the RV does not appear significantly dilated or hypokinetic. FINDINGS  Left Ventricle: Left ventricular ejection fraction, by estimation, is 60 to 65%. The left ventricle has normal function. The left ventricle has no regional wall motion abnormalities. The left ventricular internal cavity size was normal in size. There is  mild left ventricular hypertrophy. Left ventricular diastolic parameters are consistent with Grade II diastolic dysfunction (pseudonormalization). Elevated left ventricular end-diastolic pressure. The E/e' is 24. Right Ventricle: The right ventricular size is normal. No increase in right ventricular wall thickness. Right ventricular systolic function is normal. There is severely elevated pulmonary artery systolic pressure. The tricuspid regurgitant velocity is 4.13 m/s, and with an assumed right atrial pressure of 3 mmHg, the estimated right ventricular systolic pressure is 75.6 mmHg. Left Atrium: Left atrial size was severely dilated. Right Atrium: Right atrial size was mildly dilated. Pericardium: There  is no evidence of pericardial effusion. Mitral Valve: The mitral valve is grossly normal. Trivial mitral valve regurgitation. Tricuspid Valve: The tricuspid valve is grossly normal. Tricuspid valve regurgitation is mild. Aortic Valve: The aortic valve is tricuspid. Aortic valve regurgitation is mild. Aortic regurgitation PHT measures 298 msec. Mild aortic stenosis is present. Aortic valve mean gradient measures 8.0 mmHg. Aortic valve peak gradient measures 18.8 mmHg. Aortic valve area, by VTI measures  1.71 cm. Pulmonic Valve: The pulmonic valve was grossly normal. Pulmonic valve regurgitation is trivial. Aorta: The aortic root and ascending aorta are structurally normal, with no evidence of dilitation. Venous: The inferior vena cava is normal in size with greater than 50% respiratory variability, suggesting right atrial pressure of 3 mmHg. IAS/Shunts: No atrial level shunt detected by color flow Doppler.  LEFT VENTRICLE PLAX 2D LVIDd:         4.90 cm  Diastology LVIDs:         3.10 cm  LV e' medial:    5.33 cm/s LV PW:         1.40 cm  LV E/e' medial:  25.0 LV IVS:        1.10 cm  LV e' lateral:   5.77 cm/s LVOT diam:     2.00 cm  LV E/e' lateral: 23.1 LV SV:         70 LV SV Index:   37 LVOT Area:     3.14 cm  RIGHT VENTRICLE RV S prime:     15.20 cm/s TAPSE (M-mode): 2.3 cm LEFT ATRIUM             Index       RIGHT ATRIUM           Index LA diam:        4.30 cm 2.26 cm/m  RA Area:     16.70 cm LA Vol (A2C):   80.9 ml 42.57 ml/m RA Volume:   48.70 ml  25.63 ml/m LA Vol (A4C):   87.3 ml 45.94 ml/m LA Biplane Vol: 91.5 ml 48.15 ml/m  AORTIC VALVE AV Area (Vmax):    1.33 cm AV Area (Vmean):   1.46 cm AV Area (VTI):     1.71 cm AV Vmax:           217.00 cm/s AV Vmean:          133.000 cm/s AV VTI:            0.409 m AV Peak Grad:      18.8 mmHg AV Mean Grad:      8.0 mmHg LVOT Vmax:         91.70 cm/s LVOT Vmean:        61.700 cm/s LVOT VTI:          0.223 m LVOT/AV VTI ratio: 0.55 AI PHT:            298 msec  AORTA Ao Root diam: 2.60 cm MITRAL VALVE                TRICUSPID VALVE MV Area (PHT): 4.31 cm     TR Peak grad:   68.2 mmHg MV Decel Time: 176 msec     TR Vmax:        413.00 cm/s MR Peak grad: 116.6 mmHg MR Mean grad: 71.0 mmHg     SHUNTS MR Vmax:      540.00 cm/s   Systemic VTI:  0.22 m MR Vmean:     390.0  cm/s    Systemic Diam: 2.00 cm MV E velocity: 133.00 cm/s MV A velocity: 90.30 cm/s MV E/A ratio:  1.47 Lyman Bishop MD Electronically signed by Lyman Bishop MD Signature Date/Time:  01/07/2021/1:23:57 PM    Final    VAS [place] LOWER EXTREMITY VENOUS (DVT)  Result Date: 01/26/2021  Lower Venous DVT Study Indications: Elevated Ddimer.  Risk Factors: COVID 19 positive. Comparison Study: No prior studies. Performing Technologist: Oliver Hum RVT  Examination Guidelines: A complete evaluation includes B-mode imaging, spectral Doppler, color Doppler, and power Doppler as needed of all accessible portions of each vessel. Bilateral testing is considered an integral part of a complete examination. Limited examinations for reoccurring indications may be performed as noted. The reflux portion of the exam is performed with the patient in reverse Trendelenburg.  +---------+---------------+---------+-----------+----------+--------------+ RIGHT    CompressibilityPhasicitySpontaneityPropertiesThrombus Aging +---------+---------------+---------+-----------+----------+--------------+ CFV      Full           Yes      Yes                                 +---------+---------------+---------+-----------+----------+--------------+ SFJ      Full                                                        +---------+---------------+---------+-----------+----------+--------------+ FV Prox  Full                                                        +---------+---------------+---------+-----------+----------+--------------+ FV Mid   Full                                                        +---------+---------------+---------+-----------+----------+--------------+ FV DistalFull                                                        +---------+---------------+---------+-----------+----------+--------------+ PFV      Full                                                        +---------+---------------+---------+-----------+----------+--------------+ POP      Full           Yes      Yes                                  +---------+---------------+---------+-----------+----------+--------------+ PTV      Full                                                        +---------+---------------+---------+-----------+----------+--------------+  PERO     Full                                                        +---------+---------------+---------+-----------+----------+--------------+   +---------+---------------+---------+-----------+----------+--------------+ LEFT     CompressibilityPhasicitySpontaneityPropertiesThrombus Aging +---------+---------------+---------+-----------+----------+--------------+ CFV      Full           Yes      Yes                                 +---------+---------------+---------+-----------+----------+--------------+ SFJ      Full                                                        +---------+---------------+---------+-----------+----------+--------------+ FV Prox  Full                                                        +---------+---------------+---------+-----------+----------+--------------+ FV Mid   Full                                                        +---------+---------------+---------+-----------+----------+--------------+ FV DistalFull                                                        +---------+---------------+---------+-----------+----------+--------------+ PFV      Full                                                        +---------+---------------+---------+-----------+----------+--------------+ POP      Full           Yes      Yes                                 +---------+---------------+---------+-----------+----------+--------------+ PTV      Full                                                        +---------+---------------+---------+-----------+----------+--------------+ PERO     Full                                                         +---------+---------------+---------+-----------+----------+--------------+  Summary: RIGHT: - There is no evidence of deep vein thrombosis in the lower extremity.  - No cystic structure found in the popliteal fossa.  LEFT: - There is no evidence of deep vein thrombosis in the lower extremity.  - No cystic structure found in the popliteal fossa.  *See table(s) above for measurements and observations. Electronically signed by Harold Barban MD on 01/26/2021 at 9:48:05 PM.    Final    ECHOCARDIOGRAM LIMITED  Result Date: 01/22/2021    ECHOCARDIOGRAM LIMITED REPORT   Patient Name:   TAMYA DENARDO Date of Exam: 01/22/2021 Medical Rec #:  [id number]          Height:       66.0 in Accession #:    [id number]         Weight:       178.0 lb Date of Birth:  August 12, 1941          BSA:          1.903 m Patient Age:    30 years           BP:           98/78 mmHg Patient Gender: F                  HR:           121 bpm. Exam Location:  Inpatient Procedure: Cardiac Doppler, Color Doppler, Intracardiac Opacification Agent,            Limited Echo and 3D Echo Indications:    R07.9* Chest pain, unspecified. Elevated troponin.  History:        Patient has prior history of Echocardiogram examinations.                 Abnormal ECG, Aortic Valve Disease, Arrythmias:Atrial                 Fibrillation, Signs/Symptoms:Dyspnea and Shortness of Breath;                 Risk Factors:Hypertension, Diabetes and Current Smoker. Covid                 positive. Pulmonary embolus. Demand ischemia.  Sonographer:    Roseanna Rainbow RDCS Referring Phys: [phone] A CALDWELL Lewis  1. Left ventricular ejection fraction, by estimation, is 40 to 45%. The left ventricle has mildly decreased function. The left ventricle demonstrates global hypokinesis. There is mild concentric left ventricular hypertrophy. Diastolic function is indeterminant due to Afib.  2. Right ventricular systolic function is mildly reduced. The right ventricular size is  normal. There is mildly elevated pulmonary artery systolic pressure.  3. Left atrial size was severely dilated.  4. Right atrial size was severely dilated.  5. The mitral valve is abnormal. Moderate mitral valve regurgitation. Moderate mitral annular calcification.  6. Tricuspid valve regurgitation is mild to moderate.  7. The aortic valve is tricuspid. There is moderate calcification of the aortic valve. There is moderate thickening of the aortic valve. Aortic valve regurgitation is moderate. Mild aortic valve stenosis.  8. The inferior vena cava is dilated in size with <50% respiratory variability, suggesting right atrial pressure of 15 mmHg. Comparison(s): Compared to prior TTE, the LVEF appears depressed to 40-45%, there is now moderate MR and AI. Patient is also in Afib currently with rapid ventricular response. FINDINGS  Left Ventricle: Left ventricular ejection fraction, by estimation, is 40 to 45%. The left ventricle has mildly decreased function. The  left ventricle demonstrates global hypokinesis. Definity contrast agent was given IV to delineate the left ventricular  endocardial borders. The left ventricular internal cavity size was normal in size. There is mild concentric left ventricular hypertrophy. Diastolic function is indeterminant due to Afib. Right Ventricle: The right ventricular size is normal. Right ventricular systolic function is mildly reduced. There is mildly elevated pulmonary artery systolic pressure. The tricuspid regurgitant velocity is 2.76 m/s, and with an assumed right atrial pressure of 8 mmHg, the estimated right ventricular systolic pressure is 94.1 mmHg. Left Atrium: Left atrial size was severely dilated. Right Atrium: Right atrial size was severely dilated. Mitral Valve: The mitral valve is abnormal. There is moderate thickening of the mitral valve leaflet(s). There is moderate calcification of the mitral valve leaflet(s). Moderate mitral annular calcification. Moderate mitral  valve regurgitation. Tricuspid Valve: The tricuspid valve is normal in structure. Tricuspid valve regurgitation is mild to moderate. Aortic Valve: The aortic valve is tricuspid. There is moderate calcification of the aortic valve. There is moderate thickening of the aortic valve. Aortic valve regurgitation is moderate. Aortic regurgitation PHT measures 335 msec. Mild aortic stenosis is present. Pulmonic Valve: The pulmonic valve was not well visualized. Aorta: The aortic root is normal in size and structure. Venous: The inferior vena cava is dilated in size with less than 50% respiratory variability, suggesting right atrial pressure of 15 mmHg. LEFT VENTRICLE PLAX 2D LVIDd:         3.70 cm LVIDs:         2.80 cm LV PW:         1.50 cm LV IVS:        1.30 cm LVOT diam:     1.70 cm LV SV:         46 LV SV Index:   24 LVOT Area:     2.27 cm  LV Volumes (MOD) LV vol d, MOD A2C: 47.8 ml LV vol d, MOD A4C: 65.3 ml LV vol s, MOD A2C: 31.2 ml LV vol s, MOD A4C: 33.0 ml LV SV MOD A2C:     16.6 ml LV SV MOD A4C:     65.3 ml LV SV MOD BP:      24.0 ml IVC IVC diam: 2.30 cm LEFT ATRIUM         Index LA diam:    4.50 cm 2.36 cm/m  AORTIC VALVE LVOT Vmax:   117.00 cm/s LVOT Vmean:  85.100 cm/s LVOT VTI:    0.202 m AI PHT:      335 msec  AORTA Ao Root diam: 3.10 cm MR Peak grad:    87.6 mmHg   TRICUSPID VALVE MR Mean grad:    57.0 mmHg   TR Peak grad:   30.5 mmHg MR Vmax:         468.00 cm/s TR Vmax:        276.00 cm/s MR Vmean:        361.0 cm/s MR PISA:         1.01 cm    SHUNTS MR PISA Eff ROA: 8 mm       Systemic VTI:  0.20 m MR PISA Radius:  0.40 cm     Systemic Diam: 1.70 cm Gwyndolyn Kaufman MD Electronically signed by Gwyndolyn Kaufman MD Signature Date/Time: 01/22/2021/1:09:48 PM    Final      Time coordinating discharge: Over 30 minutes    [name], MD  [facility] 01/27/2021, 2:24 PM

## 2021-01-27 NOTE — TOC Transition Note (Signed)
Transition of Care [facility]) - CM/SW Discharge Note   Patient Details  Name: Elizabeth Melton MRN: [id number] Date of Birth: 1941/02/04  [facility] of [facility]) CM/SW Contact:  Trish Mage, LCSW Phone Number: 01/27/2021, 10:21 AM   Clinical Narrative:   Patient who is stable for d/c today is in need of HH PT, O2, has a ride home. SAT note and orders seen and appreciated. Contacted Cindie with [facility] who confirmed ability to provide [facility] services.  Contacted Caryl Pina with [employer] who will arrange for delivery of travel canister, home unit.  No further needs identified.  TOC sign off.  Final next level of care: [place] Barriers to Discharge: No Barriers Identified   Patient Goals and CMS Choice     Choice offered to / list presented to : Patient  Discharge Placement                       Discharge Plan and Services   Discharge Planning Services: CM Consult Post Acute Care Choice: Home Health                               Social Determinants of Health (SDOH) Interventions     Readmission Risk Interventions Readmission Risk Prevention Plan 09/10/2020  Post Dischage Appt Complete  Medication Screening Complete  Transportation Screening Complete  Some recent data might be hidden

## 2021-01-27 NOTE — Care Management Important Message (Signed)
Important Message  Patient Details IM Letter placed in door caddy for the Patient. Name: Elizabeth Melton MRN: [id number] Date of Birth: 1941/04/19   Medicare Important Message Given:  Yes     Kerin Salen 01/27/2021, 12:41 PM

## 2021-01-27 NOTE — Plan of Care (Signed)

## 2021-01-28 ENCOUNTER — Telehealth: Payer: Self-pay | Admitting: Internal Medicine

## 2021-01-28 LAB — URINE CULTURE

## 2021-01-28 NOTE — Telephone Encounter (Signed)
Staff message sent to me/MR by Judeen Hammans, [facility]: Jon Gills, MD Cc: Gaberial Cada, Waldemar Dickens, CMA You put in an order on 12/13 for pt to have Chest CT High Res in 3 months. She was admitted on 2/6 and discharged on 2/17. She CT angio on 2/6. Do you still want CT Chest done in March?   Please advise.    [place]    MR, please advise if you still want pt to have HRCT performed since she just had CTa while in hospital.

## 2021-01-29 DIAGNOSIS — E119 Type 2 diabetes mellitus without complications: Secondary | ICD-10-CM | POA: Diagnosis not present

## 2021-01-29 DIAGNOSIS — I4891 Unspecified atrial fibrillation: Secondary | ICD-10-CM | POA: Diagnosis not present

## 2021-01-29 DIAGNOSIS — U071 COVID-19: Secondary | ICD-10-CM | POA: Diagnosis not present

## 2021-01-29 DIAGNOSIS — I10 Essential (primary) hypertension: Secondary | ICD-10-CM | POA: Diagnosis not present

## 2021-01-29 DIAGNOSIS — I248 Other forms of acute ischemic heart disease: Secondary | ICD-10-CM | POA: Diagnosis not present

## 2021-01-29 DIAGNOSIS — I2692 Saddle embolus of pulmonary artery without acute cor pulmonale: Secondary | ICD-10-CM | POA: Diagnosis not present

## 2021-01-29 DIAGNOSIS — I959 Hypotension, unspecified: Secondary | ICD-10-CM | POA: Diagnosis not present

## 2021-01-29 DIAGNOSIS — J1282 Pneumonia due to coronavirus disease 2019: Secondary | ICD-10-CM | POA: Diagnosis not present

## 2021-01-29 DIAGNOSIS — J9601 Acute respiratory failure with hypoxia: Secondary | ICD-10-CM | POA: Diagnosis not present

## 2021-02-01 NOTE — Telephone Encounter (Signed)
Asked again by Judeen Hammans if HRCT needed to be scheduled or if the CT angio that would be okay. MR, please advise.

## 2021-02-02 DIAGNOSIS — M810 Age-related osteoporosis without current pathological fracture: Secondary | ICD-10-CM | POA: Diagnosis not present

## 2021-02-02 DIAGNOSIS — E1165 Type 2 diabetes mellitus with hyperglycemia: Secondary | ICD-10-CM | POA: Diagnosis not present

## 2021-02-02 DIAGNOSIS — I10 Essential (primary) hypertension: Secondary | ICD-10-CM | POA: Diagnosis not present

## 2021-02-02 DIAGNOSIS — I959 Hypotension, unspecified: Secondary | ICD-10-CM | POA: Diagnosis not present

## 2021-02-02 DIAGNOSIS — E1169 Type 2 diabetes mellitus with other specified complication: Secondary | ICD-10-CM | POA: Diagnosis not present

## 2021-02-02 DIAGNOSIS — U071 COVID-19: Secondary | ICD-10-CM | POA: Diagnosis not present

## 2021-02-02 DIAGNOSIS — E119 Type 2 diabetes mellitus without complications: Secondary | ICD-10-CM | POA: Diagnosis not present

## 2021-02-02 DIAGNOSIS — J9601 Acute respiratory failure with hypoxia: Secondary | ICD-10-CM | POA: Diagnosis not present

## 2021-02-02 DIAGNOSIS — J1282 Pneumonia due to coronavirus disease 2019: Secondary | ICD-10-CM | POA: Diagnosis not present

## 2021-02-02 DIAGNOSIS — E039 Hypothyroidism, unspecified: Secondary | ICD-10-CM | POA: Diagnosis not present

## 2021-02-02 DIAGNOSIS — I4891 Unspecified atrial fibrillation: Secondary | ICD-10-CM | POA: Diagnosis not present

## 2021-02-02 DIAGNOSIS — E785 Hyperlipidemia, unspecified: Secondary | ICD-10-CM | POA: Diagnosis not present

## 2021-02-02 DIAGNOSIS — I248 Other forms of acute ischemic heart disease: Secondary | ICD-10-CM | POA: Diagnosis not present

## 2021-02-02 DIAGNOSIS — I2692 Saddle embolus of pulmonary artery without acute cor pulmonale: Secondary | ICD-10-CM | POA: Diagnosis not present

## 2021-02-02 DIAGNOSIS — J449 Chronic obstructive pulmonary disease, unspecified: Secondary | ICD-10-CM | POA: Diagnosis not present

## 2021-02-02 NOTE — Telephone Encounter (Signed)
She had covid feb 2022  Plan - needs quick post covid fu with app sometime now few days to several days but minimum 21 d from covid  -  Do HRCT in April 2022 -> 24mn visit in April 2022

## 2021-02-02 NOTE — Telephone Encounter (Signed)
Called patient. She did not answer. Left message for her to call back.

## 2021-02-03 NOTE — Telephone Encounter (Signed)
I have placed in folder to be scheduled for April.

## 2021-02-03 NOTE — Telephone Encounter (Signed)
Routing this encounter to [place] as an FYI about the HRCT.

## 2021-02-04 DIAGNOSIS — I959 Hypotension, unspecified: Secondary | ICD-10-CM | POA: Diagnosis not present

## 2021-02-04 DIAGNOSIS — I2692 Saddle embolus of pulmonary artery without acute cor pulmonale: Secondary | ICD-10-CM | POA: Diagnosis not present

## 2021-02-04 DIAGNOSIS — I10 Essential (primary) hypertension: Secondary | ICD-10-CM | POA: Diagnosis not present

## 2021-02-04 DIAGNOSIS — J9601 Acute respiratory failure with hypoxia: Secondary | ICD-10-CM | POA: Diagnosis not present

## 2021-02-04 DIAGNOSIS — E119 Type 2 diabetes mellitus without complications: Secondary | ICD-10-CM | POA: Diagnosis not present

## 2021-02-04 DIAGNOSIS — I248 Other forms of acute ischemic heart disease: Secondary | ICD-10-CM | POA: Diagnosis not present

## 2021-02-04 DIAGNOSIS — U071 COVID-19: Secondary | ICD-10-CM | POA: Diagnosis not present

## 2021-02-04 DIAGNOSIS — J1282 Pneumonia due to coronavirus disease 2019: Secondary | ICD-10-CM | POA: Diagnosis not present

## 2021-02-04 DIAGNOSIS — I4891 Unspecified atrial fibrillation: Secondary | ICD-10-CM | POA: Diagnosis not present

## 2021-02-09 ENCOUNTER — Ambulatory Visit (HOSPITAL_COMMUNITY): Payer: Medicare HMO | Admitting: Nurse Practitioner

## 2021-02-17 ENCOUNTER — Other Ambulatory Visit: Payer: Self-pay

## 2021-02-17 ENCOUNTER — Ambulatory Visit (HOSPITAL_COMMUNITY)
Admission: RE | Admit: 2021-02-17 | Discharge: 2021-02-17 | Disposition: A | Discharge status: Home / Self Care | Payer: Medicare HMO | Source: Ambulatory Visit | Attending: Nurse Practitioner | Admitting: Nurse Practitioner

## 2021-02-17 ENCOUNTER — Encounter (HOSPITAL_COMMUNITY): Payer: Self-pay | Admitting: Nurse Practitioner

## 2021-02-17 VITALS — BP 130/72 | HR 84 | Ht 66.0 in | Wt 181.0 lb

## 2021-02-17 DIAGNOSIS — Z86711 Personal history of pulmonary embolism: Secondary | ICD-10-CM | POA: Diagnosis not present

## 2021-02-17 DIAGNOSIS — J849 Interstitial pulmonary disease, unspecified: Secondary | ICD-10-CM | POA: Insufficient documentation

## 2021-02-17 DIAGNOSIS — I4891 Unspecified atrial fibrillation: Secondary | ICD-10-CM | POA: Diagnosis not present

## 2021-02-17 DIAGNOSIS — E119 Type 2 diabetes mellitus without complications: Secondary | ICD-10-CM | POA: Insufficient documentation

## 2021-02-17 DIAGNOSIS — Z79890 Hormone replacement therapy (postmenopausal): Secondary | ICD-10-CM | POA: Diagnosis not present

## 2021-02-17 DIAGNOSIS — E039 Hypothyroidism, unspecified: Secondary | ICD-10-CM | POA: Diagnosis not present

## 2021-02-17 DIAGNOSIS — Z79899 Other long term (current) drug therapy: Secondary | ICD-10-CM | POA: Insufficient documentation

## 2021-02-17 DIAGNOSIS — I429 Cardiomyopathy, unspecified: Secondary | ICD-10-CM | POA: Diagnosis not present

## 2021-02-17 DIAGNOSIS — E785 Hyperlipidemia, unspecified: Secondary | ICD-10-CM | POA: Diagnosis not present

## 2021-02-17 DIAGNOSIS — Z9981 Dependence on supplemental oxygen: Secondary | ICD-10-CM | POA: Insufficient documentation

## 2021-02-17 DIAGNOSIS — R600 Localized edema: Secondary | ICD-10-CM | POA: Insufficient documentation

## 2021-02-17 DIAGNOSIS — F17210 Nicotine dependence, cigarettes, uncomplicated: Secondary | ICD-10-CM | POA: Diagnosis not present

## 2021-02-17 DIAGNOSIS — R6889 Other general symptoms and signs: Secondary | ICD-10-CM | POA: Diagnosis not present

## 2021-02-17 DIAGNOSIS — I10 Essential (primary) hypertension: Secondary | ICD-10-CM | POA: Insufficient documentation

## 2021-02-17 DIAGNOSIS — Z7901 Long term (current) use of anticoagulants: Secondary | ICD-10-CM | POA: Insufficient documentation

## 2021-02-17 DIAGNOSIS — Z8616 Personal history of COVID-19: Secondary | ICD-10-CM | POA: Diagnosis not present

## 2021-02-17 LAB — COMPREHENSIVE METABOLIC PANEL
ALT: 31 U/L (ref 0–44)
AST: 45 U/L — ABNORMAL HIGH (ref 15–41)
Albumin: 2.8 g/dL — ABNORMAL LOW (ref 3.5–5.0)
Alkaline Phosphatase: 67 U/L (ref 38–126)
Anion gap: 8 (ref 5–15)
BUN: 11 mg/dL (ref 8–23)
CO2: 25 mmol/L (ref 22–32)
Calcium: 9 mg/dL (ref 8.9–10.3)
Chloride: 109 mmol/L (ref 98–111)
Creatinine, Ser: 0.8 mg/dL (ref 0.44–1.00)
GFR, Estimated: >60 mL/min (ref >60)
Glucose, Bld: 183 mg/dL — ABNORMAL HIGH (ref 70–99)
Potassium: 3.8 mmol/L (ref 3.5–5.1)
Sodium: 142 mmol/L (ref 135–145)
Total Bilirubin: 0.9 mg/dL (ref 0.3–1.2)
Total Protein: 7 g/dL (ref 6.5–8.1)

## 2021-02-17 LAB — CBC
HCT: 42 % (ref 36.0–46.0)
Hemoglobin: 13.3 g/dL (ref 12.0–15.0)
MCH: 33 pg (ref 26.0–34.0)
MCHC: 31.7 g/dL (ref 30.0–36.0)
MCV: 104.2 fL — ABNORMAL HIGH (ref 80.0–100.0)
Platelets: 246 10*3/uL (ref 150–400)
RBC: 4.03 MIL/uL (ref 3.87–5.11)
RDW: 16.7 % — ABNORMAL HIGH (ref 11.5–15.5)
WBC: 6.2 10*3/uL (ref 4.0–10.5)
nRBC: 0.8 % — ABNORMAL HIGH (ref 0.0–0.2)

## 2021-02-17 LAB — TSH: TSH: 6.999 u[IU]/mL — ABNORMAL HIGH (ref 0.350–4.500)

## 2021-02-17 MED ORDER — POTASSIUM CHLORIDE ER 10 MEQ PO TBCR: 10.0000 meq | EXTENDED_RELEASE_TABLET | Freq: Every day | ORAL | 1 refills | Status: DC

## 2021-02-17 MED ORDER — FUROSEMIDE 20 MG PO TABS: ORAL_TABLET | ORAL | 1 refills | Status: DC

## 2021-02-17 NOTE — Patient Instructions (Signed)
Start lasix --- take 2 tablet daily for 3 days then reduce to 1 tablet daily  Start Potassium 11mq once a day

## 2021-02-17 NOTE — Progress Notes (Addendum)
Primary Care Physician: Kathyrn Lass, MD Referring Physician:[facility] f/u    Elizabeth Melton is a 80 y.o. female with a h/o medical history significant ofILD(ANA positive)who was previously on nocturnal oxygen but has been off for some time, history of saddle PE recently stopped Eliquis, essential hypertension, hypothyroidism, hyperlipidemia, type 2 diabetes mellitus who presented to the ED with progressive shortness of breath and intermittent dizziness over the past week. Patient initially thought it was related to her Eliquis, she contacted her pulmonologist who elected to have patient stop her anticoagulant. Patient symptoms continue to progress.  She was admitted for acute hypoxic respiratory failure 2/2 COVID 19 pneumonia.Initially had planned to discharge,  but developed a fib with RVR when working with therapy. She became hypotensive on diltiazem and was started on amiodarone. Amiodarone was stopped  due to her elevated liver enzymes.  She was then started on Toprol.  Heart rate tolerated Toprol which was decreased some for mild bradycardia however patient was asymptomatic. She did convert to SR prior to d/c. Echo showed afib with RVR.   In the clinic today, ekg shows NSR. She has not felt any irregular heart beat. She did start having swelling of her lower extremities on 2/19, just a few days out of the hospital.   She feels her weight is up close to 20 lbs. By my estimation from last weight at hospital, weight is up 8 lbs. She has not reported to anyone. She was d/c on oxygen and is wearing most of the time. She does not  feel her lung status is back to normal yet. LE's are very shiny and hard. She also also noted that she has noted some dark stools at home and has had some intermittent mild nose bleeds. plts on last CBC 136. Hematuria was noted in the hospital as well.   Today, she denies symptoms of palpitations, chest pain, shortness of breath, orthopnea, PND, lower  extremity edema, dizziness, presyncope, syncope, or neurologic sequela. The patient is tolerating medications without difficulties and is otherwise without complaint today.   Past Medical History:  Diagnosis Date  . Diabetes mellitus without complication ([place])   . Hyperlipidemia   . Hypertension   . Hypothyroidism   . Thrombocytopenia ([place])    Past Surgical History:  Procedure Laterality Date  . TUBAL LIGATION      Current Outpatient Medications  Medication Sig Dispense Refill  . apixaban (ELIQUIS) 5 MG TABS tablet Take 1 tablet (5 mg total) by mouth 2 (two) times daily. 60 tablet 1  . b complex vitamins tablet Take 1 tablet by mouth daily.    . Calcium Carb-Cholecalciferol (CALCIUM + D3) 600-200 MG-UNIT TABS Take 2 tablets by mouth daily.     . cholecalciferol (VITAMIN D3) 25 MCG (1000 UT) tablet Take 1,000 Units by mouth daily.    Marland Kitchen levothyroxine (SYNTHROID, LEVOTHROID) 75 MCG tablet Take 75 mcg by mouth daily before breakfast.     . losartan (COZAAR) 25 MG tablet Take 0.5 tablets (12.5 mg total) by mouth daily. 30 tablet 3  . lovastatin (MEVACOR) 40 MG tablet Take 40 mg by mouth at bedtime.    . metoprolol succinate (TOPROL-XL) 25 MG 24 hr tablet Take 0.5 tablets (12.5 mg total) by mouth daily. 30 tablet 3  . Omega-3 Fatty Acids (FISH OIL PO) Take 1 capsule by mouth daily.    . Tiotropium Bromide-Olodaterol (STIOLTO RESPIMAT) 2.5-2.5 MCG/ACT AERS Inhale 2 puffs into the lungs daily. 1 each 1  . acetaminophen (  TYLENOL) 500 MG tablet Take 500 mg by mouth daily as needed for headache (pain).     . Blood Glucose Monitoring Suppl (TRUE METRIX METER) w/Device KIT 1 each by Other route in the morning and at bedtime.    Marland Kitchen glucose blood test strip 1 each by Other route as needed for other (blood sugar).    . SMART SENSE THIN LANCETS 26G MISC 1 each by Does not apply route 2 (two) times daily.     No current facility-administered medications for this encounter.    No Known  Allergies  Social History   Socioeconomic History  . Marital status: Married    Spouse name: Not on file  . Number of children: Not on file  . Years of education: Not on file  . Highest education level: Not on file  Occupational History  . Not on file  Tobacco Use  . Smoking status: Current Every Day Smoker    Packs/day: 0.75    Years: 60.00    Pack years: 45.00    Types: Cigarettes  . Smokeless tobacco: Former Systems developer    Quit date: 09/24/1962  . Tobacco comment: 1 pack lasts 4 days  Vaping Use  . Vaping Use: Never used  Substance and Sexual Activity  . Alcohol use: Yes  . Drug use: No  . Sexual activity: Not on file  Other Topics Concern  . Not on file  Social History Narrative  . Not on file   Social Determinants of Health   Financial Resource Strain: Not on file  Food Insecurity: Not on file  Transportation Needs: Not on file  Physical Activity: Not on file  Stress: Not on file  Social Connections: Not on file  Intimate Partner Violence: Not on file    Family History  Problem Relation Age of Onset  . Diabetes Mother   . Kidney disease Mother   . Hypertension Mother   . Other Father        tuberculosis  . Hypertension Sister   . Hyperlipidemia Sister     ROS- All systems are reviewed and negative except as per the HPI above  Physical Exam: Vitals:   02/17/21 1540  Weight: 82.1 kg   Wt Readings from Last 3 Encounters:  02/17/21 82.1 kg  01/24/21 78.4 kg  11/22/20 80.5 kg    Labs: Lab Results  Component Value Date   NA 137 01/27/2021   K 4.7 01/27/2021   CL 102 01/27/2021   CO2 30 01/27/2021   GLUCOSE 140 (H) 01/27/2021   BUN 18 01/27/2021   CREATININE 0.89 01/27/2021   CALCIUM 8.3 (L) 01/27/2021   PHOS 2.6 01/26/2021   MG 2.3 01/27/2021   Lab Results  Component Value Date   INR 1.3 (H) 09/05/2020   Lab Results  Component Value Date   CHOL 105 01/21/2021   HDL 28 (L) 01/21/2021   LDLCALC 62 01/21/2021   TRIG 77 01/21/2021      GEN- The patient is well appearing, alert and oriented x 3 today.   Head- normocephalic, atraumatic Eyes-  Sclera clear, conjunctiva pink Ears- hearing intact Oropharynx- clear Neck- supple, no JVP Lymph- no cervical lymphadenopathy Lungs- Clear to ausculation bilaterally, normal work of breathing Heart- Regular rate and rhythm, no murmurs, rubs or gallops, PMI not laterally displaced GI- soft, NT, ND, + BS Extremities- no clubbing, cyanosis, or edema MS- no significant deformity or atrophy Skin- no rash or lesion Psych- euthymic mood, full affect Neuro- strength and sensation  are intact  Echo 01/07/21- 1. Left ventricular ejection fraction, by estimation, is 60 to 65%. The  left ventricle has normal function. The left ventricle has no regional  wall motion abnormalities. There is mild left ventricular hypertrophy.  Left ventricular diastolic parameters  are consistent with Grade II diastolic dysfunction (pseudonormalization).  Elevated left ventricular end-diastolic pressure. The E/e' is 24.  2. Right ventricular systolic function is normal. The right ventricular  size is normal. There is severely elevated pulmonary artery systolic  pressure. The estimated right ventricular systolic pressure is 66.0   pressure. The estimated right ventricular systolic pressure is 63.0 mmHg.  3. Left atrial size was severely dilated.  4. Right atrial size was mildly dilated.  5. The mitral valve is grossly normal. Trivial mitral valve  regurgitation.  6. The aortic valve is tricuspid. Aortic valve regurgitation is mild.  Mild aortic valve stenosis. Aortic valve area, by VTI measures 1.71 cm.  Aortic valve mean gradient measures 8.0 mmHg. Aortic valve Vmax measures  2.17 m/s.  7. The inferior vena cava is normal in size with greater than 50%  respiratory variability, suggesting right atrial pressure of 3 mmHg.   Comparison(s): Changes from prior study are noted. 09/06/20: LVEF  60-65%,  RVSP 70 mmHG, severely dilated and hypokinetic RV. In my review of the  previous study, the RV does not appear significantly dilated or  hypokinetic.   ECHO- 2/12- limited. Left ventricular ejection fraction, by estimation, is 40 to 45%. The  left ventricle has mildly decreased function. The left ventricle  demonstrates global hypokinesis. There is mild concentric left ventricular  hypertrophy. Diastolic function is  indeterminant due to Afib.  2. Right ventricular systolic function is mildly reduced. The right  ventricular size is normal. There is mildly elevated pulmonary artery  systolic pressure.  3. Left atrial size was severely dilated.  4. Right atrial size was severely dilated.  5. The mitral valve is abnormal. Moderate mitral valve regurgitation.  Moderate mitral annular calcification.  6. Tricuspid valve regurgitation is mild to moderate.  7. The aortic valve is tricuspid. There is moderate calcification of the  aortic valve. There is moderate thickening of the aortic valve. Aortic  valve regurgitation is moderate. Mild aortic valve stenosis.  8. The inferior vena cava is dilated in size with <50% respiratory  variability, suggesting right atrial pressure of 15 mmHg.   Comparison(s): Compared to prior TTE, the LVEF appears depressed to  40-45%, there is now moderate MR and AI. Patient is also in Afib currently  with rapid ventricular response.   EKG- Sinus rhythm at 84 bpm, pr int 110 ms, qrs int 92 ms, qtc 408 ms   Assessment and Plan: 1. New onset afib In the setting of covid pneumonia  She is now off amiodarone 2/2 elevate liver enzymes while in the  hospital  and on low dose  Toprol as she had bradycardia on higher doses  2. TCM Probably was related to tachycardia and should recover  F/u echo in 3 months   3. Covid 19 improving Pending seeing  Pulmonary in the near future  Improved but not back to baseline  Continue  home O2 via nasal Cannula    4. CHA2DS2VASc score of at least  8  Continue eliquis 5 mg bid   5. HTN Stable   6. LLE New onset since d/c Elevate legs  Avoid salt  Cbc/cmet/tsh today  Will start 40 mg lasix x 3 days then lower to 20  mg daily  Add K+ 10 meq daily  This can be further evaluated with Dr. Gardiner Rhyme on f/u 3/15  Geroge Baseman. Cay[place] Dubray, [facility] [address] [place], [name] [address] [phone]

## 2021-02-21 NOTE — Progress Notes (Deleted)
Cardiology Office Note:    Date:  02/21/2021   ID:  Elizabeth Melton, DOB [birth year], MRN [id number]  PCP:  Kathyrn Lass, MD  Cardiologist:  Ena Dawley, MD  Electrophysiologist:  None   Referring MD: Kathyrn Lass, MD   No chief complaint on file. ***  History of Present Illness:    Elizabeth Melton is a 80 y.o. female with a hx of T2DM, hypertension, hyperlipidemia, hypothyroidism, tobacco use, PE, atrial fibrillation who presents for hospital follow-up.  She was admitted to [facility]2021 with Covid pneumonia.  Found to be in atrial fibrillation with RVR, which was a new diagnosis.  She had been on Eliquis given PE history but have been recently discontinued.  Eliquis was restarted for anticoagulation.  She became hypotensive with diltiazem, was started on amiodarone drip initially.  Noted to have troponin elevation up to 1442, suspect demand ischemia in setting of A. fib with RVR and hypotension.  She converted to sinus rhythm on amiodarone drip.  Had significant bradycardia while on amiodarone and beta-blocker.  Amiodarone was stopped due to elevated liver enzymes, and she was continued on metoprolol succinate 25 mg daily.  Echocardiogram on 01/22/2021 showed LVEF 40 to 45%, global hypokinesis, mild RV systolic dysfunction, severe biatrial enlargement, moderate MR, mild to moderate TR. 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  Started on Toprol-XL 25 mg daily losartan 12.5 mg daily.  Lasix 20 mg daily as needed was added.  Past Medical History:  Diagnosis Date  . Diabetes mellitus without complication ([place])   . Hyperlipidemia   . Hypertension   . Hypothyroidism   . Thrombocytopenia ([place])     Past Surgical History:  Procedure Laterality Date  . TUBAL LIGATION      Current Medications: No outpatient medications have been marked as taking for the 02/22/21 encounter (Appointment) with [name], MD.     Allergies:   Patient has no known allergies.   Social  History   Socioeconomic History  . Marital status: Married    Spouse name: Not on file  . Number of children: Not on file  . Years of education: Not on file  . Highest education level: Not on file  Occupational History  . Not on file  Tobacco Use  . Smoking status: Current Every Day Smoker    Packs/day: 0.75    Years: 60.00    Pack years: 45.00    Types: Cigarettes  . Smokeless tobacco: Former Systems developer    Quit date: 09/24/1962  . Tobacco comment: 1 pack lasts 4 days  Vaping Use  . Vaping Use: Never used  Substance and Sexual Activity  . Alcohol use: Yes  . Drug use: No  . Sexual activity: Not on file  Other Topics Concern  . Not on file  Social History Narrative  . Not on file   Social Determinants of Health   Financial Resource Strain: Not on file  Food Insecurity: Not on file  Transportation Needs: Not on file  Physical Activity: Not on file  Stress: Not on file  Social Connections: Not on file     Family History: The patient's ***family history includes Diabetes in her mother; Hyperlipidemia in her sister; Hypertension in her mother and sister; Kidney disease in her mother; Other in her father.  ROS:   Please see the history of present illness.    *** All other systems reviewed and are negative.  EKGs/Labs/Other Studies Reviewed:    The following studies were reviewed today: ***  EKG:  EKG is *** ordered today.  The ekg ordered today demonstrates ***  Recent Labs: 01/24/2021: B Natriuretic Peptide 137.5 01/27/2021: Magnesium 2.3 02/17/2021: ALT 31; BUN 11; Creatinine, Ser 0.80; Hemoglobin 13.3; Platelets 246; Potassium 3.8; Sodium 142; TSH 6.999  Recent Lipid Panel    Component Value Date/Time   CHOL 105 01/21/2021 0242   TRIG 77 01/21/2021 0242   HDL 28 (L) 01/21/2021 0242   CHOLHDL 3.8 01/21/2021 0242   VLDL 15 01/21/2021 0242   LDLCALC 62 01/21/2021 0242    Physical Exam:    VS:  There were no vitals taken for this visit.    Wt Readings from  Last 3 Encounters:  02/17/21 181 lb (82.1 kg)  01/24/21 172 lb 13.5 oz (78.4 kg)  11/22/20 177 lb 6.4 oz (80.5 kg)     GEN: *** Well nourished, well developed in no acute distress HEENT: Normal NECK: No JVD; No carotid bruits LYMPHATICS: No lymphadenopathy CARDIAC: ***RRR, no murmurs, rubs, gallops RESPIRATORY:  Clear to auscultation without rales, wheezing or rhonchi  ABDOMEN: Soft, non-tender, non-distended MUSCULOSKELETAL:  No edema; No deformity  SKIN: Warm and dry NEUROLOGIC:  Alert and oriented x 3 PSYCHIATRIC:  Normal affect   ASSESSMENT:    No diagnosis found. PLAN:    Atrial fibrillation: New diagnosis during admission for COVID-19 pneumonia 01/2021.  Converted to sinus rhythm on amiodarone but was discontinued due to transaminitis.   -Continue Toprol-XL 25 mg -Continue Eliquis 5 mg twice daily  Chronic combined systolic and diastolic heart failure: EF 40 to 45%.  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 fib with RVR -Continue losartan 12.5 mg daily -Continue Toprol-XL 12.5 mg daily -Continue Lasix 20 mg daily.  Will check BMP -Repeat echocardiogram in 3 months  PE: CTPA 09/05/2020 showed acute PE with large volume thrombus, including infection saddle embolus. -Continue Eliquis  Hypertension: On Toprol-XL 12.5 mg daily and losartan 12.5 mg daily  Hyperlipidemia: On lovastatin 40 mg daily  RTC in ***   Medication Adjustments/Labs and Tests Ordered: Current medicines are reviewed at length with the patient today.  Concerns regarding medicines are outlined above.  No orders of the defined types were placed in this encounter.  No orders of the defined types were placed in this encounter.   There are no Patient Instructions on file for this visit.   Signed, [name], MD  02/21/2021 10:24 PM    [facility]

## 2021-02-22 ENCOUNTER — Ambulatory Visit: Payer: Medicare HMO | Admitting: Cardiology

## 2021-02-23 ENCOUNTER — Other Ambulatory Visit: Payer: Medicare HMO

## 2021-02-23 DIAGNOSIS — R0602 Shortness of breath: Secondary | ICD-10-CM | POA: Diagnosis not present

## 2021-02-23 DIAGNOSIS — U071 COVID-19: Secondary | ICD-10-CM | POA: Diagnosis not present

## 2021-02-25 DIAGNOSIS — I959 Hypotension, unspecified: Secondary | ICD-10-CM | POA: Diagnosis not present

## 2021-02-25 DIAGNOSIS — E119 Type 2 diabetes mellitus without complications: Secondary | ICD-10-CM | POA: Diagnosis not present

## 2021-02-25 DIAGNOSIS — I248 Other forms of acute ischemic heart disease: Secondary | ICD-10-CM | POA: Diagnosis not present

## 2021-02-25 DIAGNOSIS — I4891 Unspecified atrial fibrillation: Secondary | ICD-10-CM | POA: Diagnosis not present

## 2021-02-25 DIAGNOSIS — U071 COVID-19: Secondary | ICD-10-CM | POA: Diagnosis not present

## 2021-02-25 DIAGNOSIS — J9601 Acute respiratory failure with hypoxia: Secondary | ICD-10-CM | POA: Diagnosis not present

## 2021-02-25 DIAGNOSIS — J1282 Pneumonia due to coronavirus disease 2019: Secondary | ICD-10-CM | POA: Diagnosis not present

## 2021-02-25 DIAGNOSIS — I2692 Saddle embolus of pulmonary artery without acute cor pulmonale: Secondary | ICD-10-CM | POA: Diagnosis not present

## 2021-02-25 DIAGNOSIS — I10 Essential (primary) hypertension: Secondary | ICD-10-CM | POA: Diagnosis not present

## 2021-02-28 DIAGNOSIS — I959 Hypotension, unspecified: Secondary | ICD-10-CM | POA: Diagnosis not present

## 2021-02-28 DIAGNOSIS — J9601 Acute respiratory failure with hypoxia: Secondary | ICD-10-CM | POA: Diagnosis not present

## 2021-02-28 DIAGNOSIS — I4891 Unspecified atrial fibrillation: Secondary | ICD-10-CM | POA: Diagnosis not present

## 2021-02-28 DIAGNOSIS — I10 Essential (primary) hypertension: Secondary | ICD-10-CM | POA: Diagnosis not present

## 2021-02-28 DIAGNOSIS — J1282 Pneumonia due to coronavirus disease 2019: Secondary | ICD-10-CM | POA: Diagnosis not present

## 2021-02-28 DIAGNOSIS — U071 COVID-19: Secondary | ICD-10-CM | POA: Diagnosis not present

## 2021-02-28 DIAGNOSIS — I2692 Saddle embolus of pulmonary artery without acute cor pulmonale: Secondary | ICD-10-CM | POA: Diagnosis not present

## 2021-02-28 DIAGNOSIS — I248 Other forms of acute ischemic heart disease: Secondary | ICD-10-CM | POA: Diagnosis not present

## 2021-02-28 DIAGNOSIS — E119 Type 2 diabetes mellitus without complications: Secondary | ICD-10-CM | POA: Diagnosis not present

## 2021-03-02 DIAGNOSIS — M810 Age-related osteoporosis without current pathological fracture: Secondary | ICD-10-CM | POA: Diagnosis not present

## 2021-03-02 DIAGNOSIS — E039 Hypothyroidism, unspecified: Secondary | ICD-10-CM | POA: Diagnosis not present

## 2021-03-02 DIAGNOSIS — E1169 Type 2 diabetes mellitus with other specified complication: Secondary | ICD-10-CM | POA: Diagnosis not present

## 2021-03-02 DIAGNOSIS — I10 Essential (primary) hypertension: Secondary | ICD-10-CM | POA: Diagnosis not present

## 2021-03-02 DIAGNOSIS — J449 Chronic obstructive pulmonary disease, unspecified: Secondary | ICD-10-CM | POA: Diagnosis not present

## 2021-03-02 DIAGNOSIS — E785 Hyperlipidemia, unspecified: Secondary | ICD-10-CM | POA: Diagnosis not present

## 2021-03-02 DIAGNOSIS — E1165 Type 2 diabetes mellitus with hyperglycemia: Secondary | ICD-10-CM | POA: Diagnosis not present

## 2021-03-06 DIAGNOSIS — I2692 Saddle embolus of pulmonary artery without acute cor pulmonale: Secondary | ICD-10-CM | POA: Diagnosis not present

## 2021-03-06 DIAGNOSIS — I959 Hypotension, unspecified: Secondary | ICD-10-CM | POA: Diagnosis not present

## 2021-03-06 DIAGNOSIS — U071 COVID-19: Secondary | ICD-10-CM | POA: Diagnosis not present

## 2021-03-06 DIAGNOSIS — E119 Type 2 diabetes mellitus without complications: Secondary | ICD-10-CM | POA: Diagnosis not present

## 2021-03-06 DIAGNOSIS — I10 Essential (primary) hypertension: Secondary | ICD-10-CM | POA: Diagnosis not present

## 2021-03-06 DIAGNOSIS — I4891 Unspecified atrial fibrillation: Secondary | ICD-10-CM | POA: Diagnosis not present

## 2021-03-06 DIAGNOSIS — I248 Other forms of acute ischemic heart disease: Secondary | ICD-10-CM | POA: Diagnosis not present

## 2021-03-06 DIAGNOSIS — J1282 Pneumonia due to coronavirus disease 2019: Secondary | ICD-10-CM | POA: Diagnosis not present

## 2021-03-06 DIAGNOSIS — J9601 Acute respiratory failure with hypoxia: Secondary | ICD-10-CM | POA: Diagnosis not present

## 2021-03-08 ENCOUNTER — Other Ambulatory Visit (HOSPITAL_COMMUNITY): Payer: Self-pay

## 2021-03-08 MED ORDER — FUROSEMIDE 20 MG PO TABS: ORAL_TABLET | ORAL | 1 refills | Status: DC

## 2021-03-08 MED ORDER — POTASSIUM CHLORIDE ER 10 MEQ PO TBCR: 10.0000 meq | EXTENDED_RELEASE_TABLET | Freq: Every day | ORAL | 1 refills | Status: DC

## 2021-03-11 ENCOUNTER — Other Ambulatory Visit (HOSPITAL_COMMUNITY): Payer: Self-pay | Admitting: *Deleted

## 2021-03-11 MED ORDER — POTASSIUM CHLORIDE ER 10 MEQ PO TBCR: 10.0000 meq | EXTENDED_RELEASE_TABLET | Freq: Every day | ORAL | 1 refills | Status: DC

## 2021-03-11 MED ORDER — FUROSEMIDE 20 MG PO TABS
20.0000 mg | ORAL_TABLET | Freq: Every day | ORAL | 1 refills | Status: DC
Start: 2021-03-11 — End: 2021-04-25

## 2021-03-14 DIAGNOSIS — J9601 Acute respiratory failure with hypoxia: Secondary | ICD-10-CM | POA: Diagnosis not present

## 2021-03-14 DIAGNOSIS — U071 COVID-19: Secondary | ICD-10-CM | POA: Diagnosis not present

## 2021-03-14 DIAGNOSIS — I959 Hypotension, unspecified: Secondary | ICD-10-CM | POA: Diagnosis not present

## 2021-03-14 DIAGNOSIS — I10 Essential (primary) hypertension: Secondary | ICD-10-CM | POA: Diagnosis not present

## 2021-03-14 DIAGNOSIS — J1282 Pneumonia due to coronavirus disease 2019: Secondary | ICD-10-CM | POA: Diagnosis not present

## 2021-03-14 DIAGNOSIS — I4891 Unspecified atrial fibrillation: Secondary | ICD-10-CM | POA: Diagnosis not present

## 2021-03-14 DIAGNOSIS — I248 Other forms of acute ischemic heart disease: Secondary | ICD-10-CM | POA: Diagnosis not present

## 2021-03-14 DIAGNOSIS — E119 Type 2 diabetes mellitus without complications: Secondary | ICD-10-CM | POA: Diagnosis not present

## 2021-03-14 DIAGNOSIS — I2692 Saddle embolus of pulmonary artery without acute cor pulmonale: Secondary | ICD-10-CM | POA: Diagnosis not present

## 2021-03-22 ENCOUNTER — Telehealth (HOSPITAL_COMMUNITY): Payer: Self-pay | Admitting: *Deleted

## 2021-03-22 ENCOUNTER — Emergency Department (HOSPITAL_COMMUNITY)
Admission: EM | Admit: 2021-03-22 | Discharge: 2021-03-23 | Disposition: A | Discharge status: Home / Self Care | Payer: Medicare HMO | Attending: Emergency Medicine | Admitting: Emergency Medicine

## 2021-03-22 ENCOUNTER — Emergency Department (HOSPITAL_COMMUNITY): Payer: Medicare HMO

## 2021-03-22 ENCOUNTER — Encounter (HOSPITAL_COMMUNITY): Payer: Self-pay

## 2021-03-22 ENCOUNTER — Other Ambulatory Visit: Payer: Self-pay

## 2021-03-22 DIAGNOSIS — I700 Atherosclerosis of aorta: Secondary | ICD-10-CM | POA: Diagnosis not present

## 2021-03-22 DIAGNOSIS — Z8616 Personal history of COVID-19: Secondary | ICD-10-CM | POA: Diagnosis not present

## 2021-03-22 DIAGNOSIS — F17210 Nicotine dependence, cigarettes, uncomplicated: Secondary | ICD-10-CM | POA: Diagnosis not present

## 2021-03-22 DIAGNOSIS — Z7901 Long term (current) use of anticoagulants: Secondary | ICD-10-CM | POA: Insufficient documentation

## 2021-03-22 DIAGNOSIS — R69 Illness, unspecified: Secondary | ICD-10-CM | POA: Diagnosis not present

## 2021-03-22 DIAGNOSIS — T59814A Toxic effect of smoke, undetermined, initial encounter: Secondary | ICD-10-CM | POA: Diagnosis not present

## 2021-03-22 DIAGNOSIS — X088XXA Exposure to other specified smoke, fire and flames, initial encounter: Secondary | ICD-10-CM | POA: Insufficient documentation

## 2021-03-22 DIAGNOSIS — R0902 Hypoxemia: Secondary | ICD-10-CM | POA: Diagnosis not present

## 2021-03-22 DIAGNOSIS — Z79899 Other long term (current) drug therapy: Secondary | ICD-10-CM | POA: Insufficient documentation

## 2021-03-22 DIAGNOSIS — R439 Unspecified disturbances of smell and taste: Secondary | ICD-10-CM | POA: Diagnosis not present

## 2021-03-22 DIAGNOSIS — E039 Hypothyroidism, unspecified: Secondary | ICD-10-CM | POA: Diagnosis not present

## 2021-03-22 DIAGNOSIS — E119 Type 2 diabetes mellitus without complications: Secondary | ICD-10-CM | POA: Diagnosis not present

## 2021-03-22 DIAGNOSIS — I10 Essential (primary) hypertension: Secondary | ICD-10-CM | POA: Insufficient documentation
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
## 2021-03-22 NOTE — Discharge Instructions (Signed)
Return for worsening difficulty breathing.

## 2021-03-22 NOTE — ED Notes (Signed)
ED Provider at bedside. 

## 2021-03-22 NOTE — Telephone Encounter (Signed)
Pt states losartan "makes me crazy" which pt states happened in the past. She would like to stop this medication.  Discussed with Roderic Palau NP - will stop losartan as BP was well controlled at last office visit. Pt missed appt with Dr. Gardiner Rhyme for hospital follow up will have pt reach out to office to have this rescheduled. Pt verbalized understanding.
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
## 2021-03-23 NOTE — ED Notes (Signed)
Patient given meal tray.
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
## 2021-03-25 ENCOUNTER — Other Ambulatory Visit: Payer: Medicare HMO

## 2021-03-26 DIAGNOSIS — U071 COVID-19: Secondary | ICD-10-CM | POA: Diagnosis not present

## 2021-03-26 DIAGNOSIS — R0602 Shortness of breath: Secondary | ICD-10-CM | POA: Diagnosis not present

## 2021-04-15 DIAGNOSIS — E1169 Type 2 diabetes mellitus with other specified complication: Secondary | ICD-10-CM | POA: Diagnosis not present

## 2021-04-15 DIAGNOSIS — Z6830 Body mass index (BMI) 30.0-30.9, adult: Secondary | ICD-10-CM | POA: Diagnosis not present

## 2021-04-15 DIAGNOSIS — J449 Chronic obstructive pulmonary disease, unspecified: Secondary | ICD-10-CM | POA: Diagnosis not present

## 2021-04-15 DIAGNOSIS — I4891 Unspecified atrial fibrillation: Secondary | ICD-10-CM | POA: Diagnosis not present

## 2021-04-15 DIAGNOSIS — I10 Essential (primary) hypertension: Secondary | ICD-10-CM | POA: Diagnosis not present

## 2021-04-15 DIAGNOSIS — J849 Interstitial pulmonary disease, unspecified: Secondary | ICD-10-CM | POA: Diagnosis not present

## 2021-04-15 DIAGNOSIS — J9611 Chronic respiratory failure with hypoxia: Secondary | ICD-10-CM | POA: Diagnosis not present

## 2021-04-15 DIAGNOSIS — E039 Hypothyroidism, unspecified: Secondary | ICD-10-CM | POA: Diagnosis not present

## 2021-04-24 ENCOUNTER — Other Ambulatory Visit (HOSPITAL_COMMUNITY): Payer: Self-pay | Admitting: Nurse Practitioner

## 2021-04-25 DIAGNOSIS — U071 COVID-19: Secondary | ICD-10-CM | POA: Diagnosis not present

## 2021-04-25 DIAGNOSIS — R0602 Shortness of breath: Secondary | ICD-10-CM | POA: Diagnosis not present

## 2021-04-29 ENCOUNTER — Encounter: Payer: Self-pay | Admitting: Cardiology

## 2021-04-29 ENCOUNTER — Ambulatory Visit: Payer: Medicare HMO | Admitting: Cardiology

## 2021-04-29 ENCOUNTER — Other Ambulatory Visit: Payer: Self-pay

## 2021-04-29 VITALS — BP 142/50 | HR 82 | Wt 174.0 lb

## 2021-04-29 DIAGNOSIS — J9601 Acute respiratory failure with hypoxia: Secondary | ICD-10-CM | POA: Diagnosis not present

## 2021-04-29 DIAGNOSIS — R0602 Shortness of breath: Secondary | ICD-10-CM | POA: Diagnosis not present

## 2021-04-29 DIAGNOSIS — I480 Paroxysmal atrial fibrillation: Secondary | ICD-10-CM

## 2021-04-29 DIAGNOSIS — I5020 Unspecified systolic (congestive) heart failure: Secondary | ICD-10-CM

## 2021-04-29 DIAGNOSIS — I5042 Chronic combined systolic (congestive) and diastolic (congestive) heart failure: Secondary | ICD-10-CM | POA: Diagnosis not present

## 2021-04-29 NOTE — Patient Instructions (Signed)
Medication Instructions:  Take furosemide (Lasix) daily  *If you need a refill on your cardiac medications before your next appointment, please call your pharmacy*   Lab Work: CMET, CBC, BNP today  If you have labs (blood work) drawn today and your tests are completely normal, you will receive your results only by: [place] Kitchen MyChart Message (if you have MyChart) OR . A paper copy in the mail If you have any lab test that is abnormal or we need to change your treatment, we will call you to review the results.   Testing/Procedures: A chest x-ray takes a picture of the organs and structures inside the chest, including the heart, lungs, and blood vessels. This test can show several things, including, whether the heart is enlarges; whether fluid is building up in the lungs; and whether pacemaker / defibrillator leads are still in place.  Your physician has requested that you have an echocardiogram. Echocardiography is a painless test that uses sound waves to create images of your heart. It provides your doctor with information about the size and shape of your heart and how well your heart's chambers and valves are working. This procedure takes approximately one hour. There are no restrictions for this procedure.  This will be done at our [facility] location:  [place]: At [facility], you and your health needs are our priority.  As part of our continuing mission to provide you with exceptional heart care, we have created designated Provider Care Teams.  These Care Teams include your primary Cardiologist (physician) and Advanced Practice Providers (APPs -  Physician Assistants and Nurse Practitioners) who all work together to provide you with the care you need, when you need it.  We recommend signing up for the patient portal called "MyChart".  Sign up information is provided on this After Visit Summary.  MyChart is used to connect with patients for Virtual Visits  (Telemedicine).  Patients are able to view lab/test results, encounter notes, upcoming appointments, etc.  Non-urgent messages can be sent to your provider as well.   To learn more about what you can do with MyChart, go to [email or web address].    Your next appointment:   3 month(s)  The format for your next appointment:   In Person  Provider:   Oswaldo Milian, MD   Other Instructions Call Pulmonology to schedule follow up appointment:  # [phone]

## 2021-04-29 NOTE — Progress Notes (Signed)
Cardiology Office Note:    Date:  05/01/2021   ID:  Elizabeth Melton, DOB [birth year], MRN [id number]  PCP:  Kathyrn Lass, MD  Cardiologist:  Ena Dawley, MD (Inactive)  Electrophysiologist:  None   Referring MD: Kathyrn Lass, MD   Chief Complaint  Patient presents with  . Shortness of Breath    History of Present Illness:    Elizabeth Melton is a 80 y.o. female with a hx of submassive PE, interstitial lung disease, diabetes, hyperlipidemia, HFpEF who was admitted on 01/16/2021 with COVID-19 pneumonia.  Course complicated by development of A. fib with RVR.  She was started on amiodarone drip and converted to sinus rhythm.  Amiodarone was discontinued given elevated liver enzymes.  She was discharged on Eliquis and Toprol-XL 25 mg daily.  She was noted while on metoprolol and amiodarone to have bradycardia.  Echocardiogram on 01/22/2021 showed LVEF 40 to 45%, severe biatrial dilatation, mildly reduced RV function, moderate MR, mild to moderate TR, moderate AI.  She was discharged on Toprol-XL 25 mg daily and losartan 12.5 mg daily.    On presentation today SPO2 initially less than 70%.  She reports she went shopping today and was without her oxygen.  She appeared dyspneic and was strongly recommended that she go to the ED but she declined.  She was started on oxygen and SPO2 gradually improved to high 80s/low 90s.  Her dyspnea improved.  She reports she was without oxygen for about 1.5 hours today.  States that she has quit smoking.  She denies any recent chest pain.  Reports he continues to short of breath with minimal exertion.  She is taking Lasix about 3 times per week.  Has been taking Eliquis, reports intermittent epistaxis but otherwise denies any bleeding issues.    Wt Readings from Last 3 Encounters:  04/29/21 174 lb (78.9 kg)  02/17/21 181 lb (82.1 kg)  01/24/21 172 lb 13.5 oz (78.4 kg)     Past Medical History:  Diagnosis Date  . Diabetes mellitus without complication  ([place])   . Hyperlipidemia   . Hypertension   . Hypothyroidism   . Thrombocytopenia ([place])     Past Surgical History:  Procedure Laterality Date  . TUBAL LIGATION      Current Medications: Current Meds  Medication Sig  . acetaminophen (TYLENOL) 500 MG tablet Take 500 mg by mouth daily as needed for headache (pain).   Marland Kitchen apixaban (ELIQUIS) 5 MG TABS tablet Take 1 tablet (5 mg total) by mouth 2 (two) times daily.  Marland Kitchen b complex vitamins tablet Take 1 tablet by mouth daily.  . Blood Glucose Monitoring Suppl (TRUE METRIX METER) w/Device KIT 1 each by Other route in the morning and at bedtime.  . Calcium Carb-Cholecalciferol (CALCIUM + D3) 600-200 MG-UNIT TABS Take 2 tablets by mouth daily.   . cholecalciferol (VITAMIN D3) 25 MCG (1000 UT) tablet Take 1,000 Units by mouth daily.  . furosemide (LASIX) 20 MG tablet Take one tablet by mouth daily  . glucose blood test strip 1 each by Other route as needed for other (blood sugar).  Marland Kitchen levothyroxine (SYNTHROID, LEVOTHROID) 75 MCG tablet Take 75 mcg by mouth daily before breakfast.   . lovastatin (MEVACOR) 40 MG tablet Take 40 mg by mouth at bedtime.  . metoprolol succinate (TOPROL-XL) 25 MG 24 hr tablet Take 0.5 tablets (12.5 mg total) by mouth daily.  . nicotine (NICODERM CQ - DOSED IN MG/24 HOURS) 21 mg/24hr patch 1 patch to skin  .  Omega-3 Fatty Acids (FISH OIL PO) Take 1 capsule by mouth daily.  . potassium chloride (KLOR-CON) 10 MEQ tablet TAKE 1 TABLET BY MOUTH EVERY DAY  . SMART SENSE THIN LANCETS 26G MISC 1 each by Does not apply route 2 (two) times daily.  . Tiotropium Bromide-Olodaterol (STIOLTO RESPIMAT) 2.5-2.5 MCG/ACT AERS Inhale 2 puffs into the lungs daily.     Allergies:   Patient has no known allergies.   Social History   Socioeconomic History  . Marital status: Married    Spouse name: Not on file  . Number of children: Not on file  . Years of education: Not on file  . Highest education level: Not on file  Occupational  History  . Not on file  Tobacco Use  . Smoking status: Current Every Day Smoker    Packs/day: 0.75    Years: 60.00    Pack years: 45.00    Types: Cigarettes  . Smokeless tobacco: Former Systems developer    Quit date: 09/24/1962  . Tobacco comment: 1 pack lasts 4 days  Vaping Use  . Vaping Use: Never used  Substance and Sexual Activity  . Alcohol use: Yes  . Drug use: No  . Sexual activity: Not on file  Other Topics Concern  . Not on file  Social History Narrative  . Not on file   Social Determinants of Health   Financial Resource Strain: Not on file  Food Insecurity: Not on file  Transportation Needs: Not on file  Physical Activity: Not on file  Stress: Not on file  Social Connections: Not on file     Family History: The patient's family history includes Diabetes in her mother; Hyperlipidemia in her sister; Hypertension in her mother and sister; Kidney disease in her mother; Other in her father.  ROS:   Please see the history of present illness.     All other systems reviewed and are negative.  EKGs/Labs/Other Studies Reviewed:    The following studies were reviewed today:   EKG:  EKG is ordered today.  The ekg ordered today demonstrates normal sinus rhythm, rate 82, nonspecific T wave flattening  Recent Labs: 01/27/2021: Magnesium 2.3 02/17/2021: TSH 6.999 04/29/2021: ALT 27; BNP 319.3; BUN 16; Creatinine, Ser 0.89; Hemoglobin 14.4; Platelets 129; Potassium 4.0; Sodium 142  Recent Lipid Panel    Component Value Date/Time   CHOL 105 01/21/2021 0242   TRIG 77 01/21/2021 0242   HDL 28 (L) 01/21/2021 0242   CHOLHDL 3.8 01/21/2021 0242   VLDL 15 01/21/2021 0242   LDLCALC 62 01/21/2021 0242    Physical Exam:    VS:  BP (!) 142/50 (BP Location: Right Arm, Patient Position: Sitting, Cuff Size: Normal)   Pulse 82   Wt 174 lb (78.9 kg)   SpO2 (!) 83%   BMI 28.08 kg/m     Wt Readings from Last 3 Encounters:  04/29/21 174 lb (78.9 kg)  02/17/21 181 lb (82.1 kg)   01/24/21 172 lb 13.5 oz (78.4 kg)     GEN: in no acute distress HEENT: Normal NECK: + JVD; No carotid bruits LYMPHATICS: No lymphadenopathy CARDIAC: RRR, no murmurs, rubs, gallops RESPIRATORY:  Bibasilar crackles ABDOMEN: Soft, non-tender, non-distended MUSCULOSKELETAL:  No edema; No deformity  SKIN: Warm and dry NEUROLOGIC:  Alert and oriented x 3 PSYCHIATRIC:  Normal affect   ASSESSMENT:    1. Acute respiratory failure with hypoxia ([place])   2. Chronic combined systolic (congestive) and diastolic (congestive) heart failure ([place])   3. PAF (  paroxysmal atrial fibrillation) ([facility])    PLAN:    Acute respiratory failure with hypoxia: on presentation today SPO2 initially less than 70%.  She reports she went shopping today and was without her oxygen.  She appeared dyspneic and was strongly recommended that she go to the ED but she declined.  She was started on oxygen in clinic and SPO2 gradually improved to high 80s/low 90s.  Hypoxia is likely multifactorial in setting of interstitial lung disease and acute on chronic combined systolic and diastolic heart failure -Counseled on importance of wearing home oxygen -Missed her recent appointment with pulmonology, recommended rescheduling -Check CXR -Has only been taking her Lasix a few days a week.  Mildly hypervolemic on exam, I recommend taking daily as prescribed.  Will check BNP, CMP, CBC  Chronic combined systolic and diastolic heart failure: Echocardiogram on 01/22/2021 showed LVEF 40 to 45%, severe biatrial dilatation, mildly reduced RV function, moderate MR, mild to moderate TR, moderate AI.  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 -She was initially started on amiodarone during hospitalization but was discontinued due to transaminitis.  Amiodarone is likely not a good long-term choice for her given her pulmonary issues as above.  She does appear to be maintaining sinus rhythm.  We will plan to repeat  echocardiogram to assess for improvement in systolic function.  If EF remains reduced, will plan evaluation for ischemia -Continue Toprol-XL 12.5 mg daily -Continue Lasix 20 mg daily.  She has been only taking a few times a week, encouraged to take daily  Paroxysmal atrial fibrillation: Diagnosed during admission with COVID-19 pneumonia in February 2022.  Converted to sinus rhythm with amiodarone, which was discontinued due to elevated liver enzymes -Appears to be maintaining sinus rhythm -Continue Toprol-XL 12.5 mg daily -Continue Eliquis 5 mg twice daily  RTC in 3 months  Medication Adjustments/Labs and Tests Ordered: Current medicines are reviewed at length with the patient today.  Concerns regarding medicines are outlined above.  Orders Placed This Encounter  Procedures  . DG Chest 2 View  . Comprehensive metabolic panel  . CBC  . Brain natriuretic peptide  . EKG 12-Lead  . ECHOCARDIOGRAM COMPLETE   No orders of the defined types were placed in this encounter.   Patient Instructions  Medication Instructions:  Take furosemide (Lasix) daily  *If you need a refill on your cardiac medications before your next appointment, please call your pharmacy*   Lab Work: CMET, CBC, BNP today  If you have labs (blood work) drawn today and your tests are completely normal, you will receive your results only by: Marland Kitchen MyChart Message (if you have MyChart) OR . A paper copy in the mail If you have any lab test that is abnormal or we need to change your treatment, we will call you to review the results.   Testing/Procedures: A chest x-ray takes a picture of the organs and structures inside the chest, including the heart, lungs, and blood vessels. This test can show several things, including, whether the heart is enlarges; whether fluid is building up in the lungs; and whether pacemaker / defibrillator leads are still in place.  Your physician has requested that you have an echocardiogram.  Echocardiography is a painless test that uses sound waves to create images of your heart. It provides your doctor with information about the size and shape of your heart and how well your heart's chambers and valves are working. This procedure takes approximately one hour. There are no restrictions for  this procedure.  This will be done at our [facility] location:  [place]: At [facility], you and your health needs are our priority.  As part of our continuing mission to provide you with exceptional heart care, we have created designated Provider Care Teams.  These Care Teams include your primary Cardiologist (physician) and Advanced Practice Providers (APPs -  Physician Assistants and Nurse Practitioners) who all work together to provide you with the care you need, when you need it.  We recommend signing up for the patient portal called "MyChart".  Sign up information is provided on this After Visit Summary.  MyChart is used to connect with patients for Virtual Visits (Telemedicine).  Patients are able to view lab/test results, encounter notes, upcoming appointments, etc.  Non-urgent messages can be sent to your provider as well.   To learn more about what you can do with MyChart, go to [email or web address].    Your next appointment:   3 month(s)  The format for your next appointment:   In Person  Provider:   Oswaldo Milian, MD   Other Instructions Call Pulmonology to schedule follow up appointment:  # [phone]     Signed, [name], MD  05/01/2021 4:46 PM    [place]

## 2021-04-30 LAB — COMPREHENSIVE METABOLIC PANEL
ALT: 27 IU/L (ref 0–32)
AST: 32 IU/L (ref 0–40)
Albumin/Globulin Ratio: 1.4 (ref 1.2–2.2)
Albumin: 4 g/dL (ref 3.7–4.7)
Alkaline Phosphatase: 68 IU/L (ref 44–121)
BUN/Creatinine Ratio: 18 (ref 12–28)
BUN: 16 mg/dL (ref 8–27)
Bilirubin Total: 1 mg/dL (ref 0.0–1.2)
CO2: 18 mmol/L — ABNORMAL LOW (ref 20–29)
Calcium: 9.1 mg/dL (ref 8.7–10.3)
Chloride: 108 mmol/L — ABNORMAL HIGH (ref 96–106)
Creatinine, Ser: 0.89 mg/dL (ref 0.57–1.00)
Globulin, Total: 2.9 g/dL (ref 1.5–4.5)
Glucose: 172 mg/dL — ABNORMAL HIGH (ref 65–99)
Potassium: 4 mmol/L (ref 3.5–5.2)
Sodium: 142 mmol/L (ref 134–144)
Total Protein: 6.9 g/dL (ref 6.0–8.5)
eGFR: 66 mL/min/{1.73_m2} (ref >59)

## 2021-04-30 LAB — CBC
Hematocrit: 43.7 % (ref 34.0–46.6)
Hemoglobin: 14.4 g/dL (ref 11.1–15.9)
MCH: 33.9 pg — ABNORMAL HIGH (ref 26.6–33.0)
MCHC: 33 g/dL (ref 31.5–35.7)
MCV: 103 fL — ABNORMAL HIGH (ref 79–97)
NRBC: 2 % — ABNORMAL HIGH (ref 0–0)
Platelets: 129 10*3/uL — ABNORMAL LOW (ref 150–450)
RBC: 4.25 x10E6/uL (ref 3.77–5.28)
RDW: 12.9 % (ref 11.7–15.4)
WBC: 7.8 10*3/uL (ref 3.4–10.8)

## 2021-04-30 LAB — BRAIN NATRIURETIC PEPTIDE: BNP: 319.3 pg/mL — ABNORMAL HIGH (ref 0.0–100.0)

## 2021-05-10 ENCOUNTER — Telehealth: Payer: Self-pay | Admitting: Cardiology

## 2021-05-10 NOTE — Telephone Encounter (Signed)
Pt c/o medication issue: 1. Name of Medication: Furosemide  2. How are you currently taking this medication (dosage and times per day)? One tap a day 3. Are you having a reaction (difficulty breathing--STAT)?  SOB 4. What is your medication issue? Patient states it is not helping her swelling at all.

## 2021-05-10 NOTE — Telephone Encounter (Signed)
Spoke to patient, patient reports increase SOB due to her "oxygen tank not set up right".  She states her big tank at home is not working and she is going to call [facility] to get this fixed.   Confirmed she does have O2 tanks that work and is using currently.    She does not have a way to check her O2 at home.    She does not have a scale at home to weigh.   Reports taking the lasix daily since last ov and reports increased urination but the has not improved the leg swelling.   Denies abdominal swelling.         Patient states she had to get off the phone [place] and had to go inside.   Advised if SOB is worse and swelling increased I would advise to proceed to ER for evaluation.   Also reminded of having cxray completed that was ordered at [facility].   Advised of location and walk in hours as well.    Advised to monitor salt intake.  Patient had to get off the phone, call ended.      Will make MD aware.

## 2021-05-10 NOTE — Telephone Encounter (Signed)
Spoke to patient she stated she has increased swelling in both lower legs and pain across top of feet.Sob seems worse.She is on O2 at 2L/min.Stated she feels like she needs more O2.Advised to double Lasix dose for the next 3 days then return to normal dose.I will send message to [place] for advice.

## 2021-05-11 NOTE — Telephone Encounter (Signed)
I agree, would recommend evaluation in ED

## 2021-05-16 ENCOUNTER — Other Ambulatory Visit: Payer: Self-pay

## 2021-05-16 ENCOUNTER — Ambulatory Visit
Admission: RE | Admit: 2021-05-16 | Discharge: 2021-05-16 | Disposition: A | Discharge status: Home / Self Care | Payer: Medicare HMO | Source: Ambulatory Visit | Attending: Cardiology | Admitting: Cardiology

## 2021-05-16 DIAGNOSIS — J9601 Acute respiratory failure with hypoxia: Secondary | ICD-10-CM

## 2021-05-16 DIAGNOSIS — R0602 Shortness of breath: Secondary | ICD-10-CM | POA: Diagnosis not present

## 2021-05-18 ENCOUNTER — Emergency Department (HOSPITAL_COMMUNITY): Payer: Medicare HMO

## 2021-05-18 ENCOUNTER — Inpatient Hospital Stay (HOSPITAL_COMMUNITY)
Admission: EM | Admit: 2021-05-18 | Discharge: 2021-05-22 | DRG: 291 | Disposition: A | Discharge status: Home Health | Payer: Medicare HMO | Attending: Internal Medicine | Admitting: Internal Medicine

## 2021-05-18 ENCOUNTER — Other Ambulatory Visit: Payer: Self-pay

## 2021-05-18 ENCOUNTER — Telehealth: Payer: Self-pay | Admitting: Cardiology

## 2021-05-18 ENCOUNTER — Encounter (HOSPITAL_COMMUNITY): Payer: Self-pay

## 2021-05-18 DIAGNOSIS — I272 Pulmonary hypertension, unspecified: Secondary | ICD-10-CM | POA: Diagnosis present

## 2021-05-18 DIAGNOSIS — Z83438 Family history of other disorder of lipoprotein metabolism and other lipidemia: Secondary | ICD-10-CM

## 2021-05-18 DIAGNOSIS — Z8249 Family history of ischemic heart disease and other diseases of the circulatory system: Secondary | ICD-10-CM

## 2021-05-18 DIAGNOSIS — J849 Interstitial pulmonary disease, unspecified: Secondary | ICD-10-CM | POA: Diagnosis not present

## 2021-05-18 DIAGNOSIS — Z7901 Long term (current) use of anticoagulants: Secondary | ICD-10-CM

## 2021-05-18 DIAGNOSIS — I083 Combined rheumatic disorders of mitral, aortic and tricuspid valves: Secondary | ICD-10-CM | POA: Diagnosis present

## 2021-05-18 DIAGNOSIS — Z86711 Personal history of pulmonary embolism: Secondary | ICD-10-CM

## 2021-05-18 DIAGNOSIS — I509 Heart failure, unspecified: Secondary | ICD-10-CM

## 2021-05-18 DIAGNOSIS — E119 Type 2 diabetes mellitus without complications: Secondary | ICD-10-CM | POA: Diagnosis not present

## 2021-05-18 DIAGNOSIS — R0902 Hypoxemia: Secondary | ICD-10-CM | POA: Diagnosis not present

## 2021-05-18 DIAGNOSIS — J84112 Idiopathic pulmonary fibrosis: Secondary | ICD-10-CM | POA: Diagnosis present

## 2021-05-18 DIAGNOSIS — Z8616 Personal history of COVID-19: Secondary | ICD-10-CM

## 2021-05-18 DIAGNOSIS — K5900 Constipation, unspecified: Secondary | ICD-10-CM | POA: Diagnosis not present

## 2021-05-18 DIAGNOSIS — Z720 Tobacco use: Secondary | ICD-10-CM | POA: Diagnosis not present

## 2021-05-18 DIAGNOSIS — G4733 Obstructive sleep apnea (adult) (pediatric): Secondary | ICD-10-CM | POA: Diagnosis present

## 2021-05-18 DIAGNOSIS — R0602 Shortness of breath: Secondary | ICD-10-CM | POA: Diagnosis not present

## 2021-05-18 DIAGNOSIS — I10 Essential (primary) hypertension: Secondary | ICD-10-CM | POA: Diagnosis not present

## 2021-05-18 DIAGNOSIS — I2510 Atherosclerotic heart disease of native coronary artery without angina pectoris: Secondary | ICD-10-CM | POA: Diagnosis present

## 2021-05-18 DIAGNOSIS — E1169 Type 2 diabetes mellitus with other specified complication: Secondary | ICD-10-CM

## 2021-05-18 DIAGNOSIS — T380X5A Adverse effect of glucocorticoids and synthetic analogues, initial encounter: Secondary | ICD-10-CM | POA: Diagnosis not present

## 2021-05-18 DIAGNOSIS — Z79899 Other long term (current) drug therapy: Secondary | ICD-10-CM

## 2021-05-18 DIAGNOSIS — I5023 Acute on chronic systolic (congestive) heart failure: Secondary | ICD-10-CM | POA: Diagnosis present

## 2021-05-18 DIAGNOSIS — I493 Ventricular premature depolarization: Secondary | ICD-10-CM | POA: Diagnosis present

## 2021-05-18 DIAGNOSIS — I4510 Unspecified right bundle-branch block: Secondary | ICD-10-CM | POA: Diagnosis present

## 2021-05-18 DIAGNOSIS — Z20822 Contact with and (suspected) exposure to covid-19: Secondary | ICD-10-CM | POA: Diagnosis present

## 2021-05-18 DIAGNOSIS — I739 Peripheral vascular disease, unspecified: Secondary | ICD-10-CM | POA: Diagnosis not present

## 2021-05-18 DIAGNOSIS — Z833 Family history of diabetes mellitus: Secondary | ICD-10-CM

## 2021-05-18 DIAGNOSIS — E039 Hypothyroidism, unspecified: Secondary | ICD-10-CM | POA: Diagnosis present

## 2021-05-18 DIAGNOSIS — I5043 Acute on chronic combined systolic (congestive) and diastolic (congestive) heart failure: Secondary | ICD-10-CM | POA: Diagnosis present

## 2021-05-18 DIAGNOSIS — E1165 Type 2 diabetes mellitus with hyperglycemia: Secondary | ICD-10-CM

## 2021-05-18 DIAGNOSIS — R911 Solitary pulmonary nodule: Secondary | ICD-10-CM | POA: Diagnosis present

## 2021-05-18 DIAGNOSIS — Z79890 Hormone replacement therapy (postmenopausal): Secondary | ICD-10-CM

## 2021-05-18 DIAGNOSIS — I5033 Acute on chronic diastolic (congestive) heart failure: Secondary | ICD-10-CM | POA: Diagnosis not present

## 2021-05-18 DIAGNOSIS — F17210 Nicotine dependence, cigarettes, uncomplicated: Secondary | ICD-10-CM | POA: Diagnosis present

## 2021-05-18 DIAGNOSIS — J80 Acute respiratory distress syndrome: Secondary | ICD-10-CM | POA: Diagnosis not present

## 2021-05-18 DIAGNOSIS — I110 Hypertensive heart disease with heart failure: Principal | ICD-10-CM | POA: Diagnosis present

## 2021-05-18 DIAGNOSIS — I480 Paroxysmal atrial fibrillation: Secondary | ICD-10-CM | POA: Diagnosis present

## 2021-05-18 DIAGNOSIS — E785 Hyperlipidemia, unspecified: Secondary | ICD-10-CM | POA: Diagnosis not present

## 2021-05-18 DIAGNOSIS — Z841 Family history of disorders of kidney and ureter: Secondary | ICD-10-CM

## 2021-05-18 DIAGNOSIS — I472 Ventricular tachycardia: Secondary | ICD-10-CM | POA: Diagnosis not present

## 2021-05-18 DIAGNOSIS — J9621 Acute and chronic respiratory failure with hypoxia: Secondary | ICD-10-CM | POA: Diagnosis not present

## 2021-05-18 DIAGNOSIS — I2584 Coronary atherosclerosis due to calcified coronary lesion: Secondary | ICD-10-CM | POA: Diagnosis present

## 2021-05-18 DIAGNOSIS — U071 COVID-19: Secondary | ICD-10-CM | POA: Diagnosis not present

## 2021-05-18 DIAGNOSIS — J439 Emphysema, unspecified: Secondary | ICD-10-CM | POA: Diagnosis present

## 2021-05-18 LAB — CBC WITH DIFFERENTIAL/PLATELET
Abs Immature Granulocytes: 0.02 10*3/uL (ref 0.00–0.07)
Basophils Absolute: 0 10*3/uL (ref 0.0–0.1)
Basophils Relative: 0 %
Eosinophils Absolute: 0.1 10*3/uL (ref 0.0–0.5)
Eosinophils Relative: 1 %
HCT: 44.7 % (ref 36.0–46.0)
Hemoglobin: 14 g/dL (ref 12.0–15.0)
Immature Granulocytes: 0 %
Lymphocytes Relative: 27 %
Lymphs Abs: 1.8 10*3/uL (ref 0.7–4.0)
MCH: 33.9 pg (ref 26.0–34.0)
MCHC: 31.3 g/dL (ref 30.0–36.0)
MCV: 108.2 fL — ABNORMAL HIGH (ref 80.0–100.0)
Monocytes Absolute: 0.4 10*3/uL (ref 0.1–1.0)
Monocytes Relative: 6 %
Neutro Abs: 4.5 10*3/uL (ref 1.7–7.7)
Neutrophils Relative %: 66 %
Platelets: 150 10*3/uL (ref 150–400)
RBC: 4.13 MIL/uL (ref 3.87–5.11)
RDW: 15.9 % — ABNORMAL HIGH (ref 11.5–15.5)
WBC: 6.9 10*3/uL (ref 4.0–10.5)
nRBC: 0.7 % — ABNORMAL HIGH (ref 0.0–0.2)

## 2021-05-18 LAB — COMPREHENSIVE METABOLIC PANEL
ALT: 20 U/L (ref 0–44)
AST: 24 U/L (ref 15–41)
Albumin: 3.2 g/dL — ABNORMAL LOW (ref 3.5–5.0)
Alkaline Phosphatase: 55 U/L (ref 38–126)
Anion gap: 7 (ref 5–15)
BUN: 20 mg/dL (ref 8–23)
CO2: 25 mmol/L (ref 22–32)
Calcium: 8.5 mg/dL — ABNORMAL LOW (ref 8.9–10.3)
Chloride: 108 mmol/L (ref 98–111)
Creatinine, Ser: 0.85 mg/dL (ref 0.44–1.00)
GFR, Estimated: >60 mL/min (ref >60)
Glucose, Bld: 138 mg/dL — ABNORMAL HIGH (ref 70–99)
Potassium: 3.5 mmol/L (ref 3.5–5.1)
Sodium: 140 mmol/L (ref 135–145)
Total Bilirubin: 1 mg/dL (ref 0.3–1.2)
Total Protein: 7.1 g/dL (ref 6.5–8.1)

## 2021-05-18 LAB — BLOOD GAS, VENOUS
Acid-Base Excess: 1.2 mmol/L (ref 0.0–2.0)
Bicarbonate: 25.2 mmol/L (ref 20.0–28.0)
O2 Saturation: 79.9 %
Patient temperature: 98.6
pCO2, Ven: 39.8 mmHg — ABNORMAL LOW (ref 44.0–60.0)
pH, Ven: 7.418 (ref 7.250–7.430)
pO2, Ven: 50.7 mmHg — ABNORMAL HIGH (ref 32.0–45.0)

## 2021-05-18 LAB — RESP PANEL BY RT-PCR (FLU A&B, COVID) ARPGX2
Influenza A by PCR: NEGATIVE
Influenza B by PCR: NEGATIVE
SARS Coronavirus 2 by RT PCR: NEGATIVE

## 2021-05-18 LAB — BRAIN NATRIURETIC PEPTIDE: B Natriuretic Peptide: 509.8 pg/mL — ABNORMAL HIGH (ref 0.0–100.0)

## 2021-05-18 MED ORDER — METHYLPREDNISOLONE SODIUM SUCC 125 MG IJ SOLR
125.0000 mg | Freq: Once | INTRAMUSCULAR | Status: AC
  Administered 2021-05-18: 125 mg via INTRAVENOUS
  Filled 2021-05-18: qty 2

## 2021-05-18 MED ORDER — SODIUM CHLORIDE 0.9 % IV SOLN: INTRAVENOUS | Status: DC

## 2021-05-18 MED ORDER — ALBUTEROL SULFATE HFA 108 (90 BASE) MCG/ACT IN AERS
2.0000 | INHALATION_SPRAY | Freq: Once | RESPIRATORY_TRACT | Status: AC
  Administered 2021-05-18: 2 via RESPIRATORY_TRACT
  Filled 2021-05-18: qty 6.7

## 2021-05-18 NOTE — ED Provider Notes (Signed)
[facility] DEPT Provider Note   CSN: [id number] Arrival date & time: 05/18/21  1815     History Chief Complaint  Patient presents with  . Shortness of Breath    Elizabeth Melton is a 80 y.o. female.  HPI Patient complains of shortness of breath worsening over the last several weeks.  She recently saw her cardiologist who scheduled an outpatient chest x-ray which was done 2 days ago and showed edema versus pneumonia.  She therefore came here for further evaluation today.  She has had a cough which is occasionally productive but not persistently productive.  She denies fever, chills, chest pain, nausea, vomiting, anorexia, weakness or paresthesia.  She is taking her usual medications.  There are no other known active modifying factors.    Past Medical History:  Diagnosis Date  . Diabetes mellitus without complication ([place])   . Hyperlipidemia   . Hypertension   . Hypothyroidism   . Thrombocytopenia [facility])     Patient Active Problem List   Diagnosis Date Noted  . Atrial fibrillation with RVR ([place])   . Demand ischemia ([place])   . Pneumonia due to COVID-19 virus 01/16/2021  . Acute respiratory failure with hypoxia ([place]) 09/06/2020  . 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 ILD (interstitial lung disease) ([place]) 10/23/2019  . Chronic respiratory failure with hypoxia ([place]) 10/23/2019  . Essential (primary) hypertension 09/18/2018  . PAD (peripheral artery disease) ([place]) 09/18/2018  . Tobacco abuse 09/18/2018  . Aortic regurgitation 09/18/2018  . Hypothyroidism   . Hyperlipidemia   . Diabetes mellitus without complication ([place])   . Lower extremity pain 09/24/2012    Past Surgical History:  Procedure Laterality Date  . TUBAL LIGATION       OB History   No obstetric history on file.     Family History  Problem Relation Age of Onset  . Diabetes Mother   . Kidney disease Mother   . Hypertension Mother   . Other Father         tuberculosis  . Hypertension Sister   . Hyperlipidemia Sister     Social History   Tobacco Use  . Smoking status: Current Every Day Smoker    Packs/day: 0.75    Years: 60.00    Pack years: 45.00    Types: Cigarettes  . Smokeless tobacco: Former Systems developer    Quit date: 09/24/1962  . Tobacco comment: 1 pack lasts 4 days  Vaping Use  . Vaping Use: Never used  Substance Use Topics  . Alcohol use: Yes  . Drug use: No    Home Medications Prior to Admission medications   Medication Sig Start Date End Date Taking? Authorizing Provider  acetaminophen (TYLENOL) 500 MG tablet Take 500 mg by mouth daily as needed for headache (pain).     [provider]  apixaban (ELIQUIS) 5 MG TABS tablet Take 1 tablet (5 mg total) by mouth 2 (two) times daily. 10/11/20   Mercy Riding, MD  b complex vitamins tablet Take 1 tablet by mouth daily.    [provider]  Blood Glucose Monitoring Suppl (TRUE METRIX METER) w/Device KIT 1 each by Other route in the morning and at bedtime. 12/25/20   [provider]  Calcium Carb-Cholecalciferol (CALCIUM + D3) 600-200 MG-UNIT TABS Take 2 tablets by mouth daily.     [provider]  cholecalciferol (VITAMIN D3) 25 MCG (1000 UT) tablet Take 1,000 Units by mouth daily.    [provider]  furosemide (LASIX) 20 MG tablet Take one tablet by mouth daily 04/25/21   [name], NP  glucose blood test strip 1 each by Other route as needed for other (blood sugar).    [provider]  levothyroxine (SYNTHROID, LEVOTHROID) 75 MCG tablet Take 75 mcg by mouth daily before breakfast.     [provider]  lovastatin (MEVACOR) 40 MG tablet Take 40 mg by mouth at bedtime.    [provider]  metoprolol succinate (TOPROL-XL) 25 MG 24 hr tablet Take 0.5 tablets (12.5 mg total) by mouth daily. 01/28/21   [name], MD  nicotine (NICODERM CQ - DOSED IN MG/24 HOURS) 21 mg/24hr patch 1 patch to skin    [provider]  Omega-3 Fatty Acids (FISH OIL PO) Take 1 capsule by mouth daily.    [provider]  potassium chloride (KLOR-CON) 10 MEQ tablet TAKE 1 TABLET BY MOUTH EVERY DAY 04/25/21   [name], NP  SMART SENSE THIN LANCETS 26G MISC 1 each by Does not apply route 2 (two) times daily.    [provider]  Tiotropium Bromide-Olodaterol (STIOLTO RESPIMAT) 2.5-2.5 MCG/ACT AERS Inhale 2 puffs into the lungs daily. 10/19/20   Brand Males, MD    Allergies    Patient has no known allergies.  Review of Systems   Review of Systems  All other systems reviewed and are negative.   Physical Exam Updated Vital Signs BP (!) 160/69   Pulse 63   Resp 19   SpO2 95%   Physical Exam Vitals and nursing note reviewed.  Constitutional:      General: She is not in acute distress.    Appearance: She is well-developed. She is not ill-appearing, toxic-appearing or diaphoretic.  HENT:     Head: Normocephalic and atraumatic.     Right Ear: External ear normal.     Left Ear: External ear normal.     Nose: No congestion or rhinorrhea.     Mouth/Throat:     Pharynx: No oropharyngeal exudate.  Eyes:     Conjunctiva/sclera: Conjunctivae normal.     Pupils: Pupils are equal, round, and reactive to light.  Neck:     Trachea: Phonation normal.  Cardiovascular:     Rate and Rhythm: Normal rate and regular rhythm.     Heart sounds: Normal heart sounds.  Pulmonary:     Effort: Pulmonary effort is normal.     Comments: Decreased ROM bilaterally.  Few scattered rhonchi and wheezes.  No increased work of breathing. Chest:     Chest wall: No tenderness.  Abdominal:     General: There is no distension.     Palpations: Abdomen is soft.     Tenderness: There is no abdominal tenderness.  Musculoskeletal:        General: Normal range of motion.     Cervical back: Normal range of motion and neck supple.  Skin:    General: Skin is warm and dry.  Neurological:     Mental Status:  She is alert and oriented to person, place, and time.     Cranial Nerves: No cranial nerve deficit.     Sensory: No sensory deficit.     Motor: No abnormal muscle tone.     Coordination: Coordination normal.  Psychiatric:        Mood and Affect: Mood normal.        Behavior: Behavior normal.        Thought  Content: Thought content normal.        Judgment: Judgment normal.     ED Results / Procedures / Treatments   Labs (all labs ordered are listed, but only abnormal results are displayed) Labs Reviewed  COMPREHENSIVE METABOLIC PANEL - Abnormal; Notable for the following components:      Result Value   Glucose, Bld 138 (*)    Calcium 8.5 (*)    Albumin 3.2 (*)    All other components within normal limits  CBC WITH DIFFERENTIAL/PLATELET - Abnormal; Notable for the following components:   MCV 108.2 (*)    RDW 15.9 (*)    nRBC 0.7 (*)    All other components within normal limits  BLOOD GAS, VENOUS - Abnormal; Notable for the following components:   pCO2, Ven 39.8 (*)    pO2, Ven 50.7 (*)    All other components within normal limits  BRAIN NATRIURETIC PEPTIDE - Abnormal; Notable for the following components:   B Natriuretic Peptide 509.8 (*)    All other components within normal limits  RESP PANEL BY RT-PCR (FLU A&B, COVID) [id number]    EKG EKG Interpretation  Date/Time:  Wednesday May 18 2021 20:11:23 EDT Ventricular Rate:  67 PR Interval:  144 QRS Duration: 104 QT Interval:  450 QTC Calculation: 461 R Axis:   91 Text Interpretation: Sinus rhythm Ventricular premature complex Biatrial enlargement Right axis deviation Abnormal T, consider ischemia, lateral leads Artifact in lead(s) I II aVR aVL aVF since last tracing no significant change Confirmed by Daleen Bo [phone]) on 05/18/2021 10:49:42 PM   Radiology DG Chest Port 1 View  Result Date: 05/18/2021 CLINICAL DATA:  Shortness of breath. EXAM: PORTABLE CHEST 1 VIEW COMPARISON:  May 16, 2021. FINDINGS: Stable  cardiomediastinal silhouette. No pneumothorax is noted. Slightly increased bilateral reticular densities are noted throughout both lungs, most consistent with chronic interstitial lung disease with superimposed pulmonary edema or atypical inflammation. Bony thorax is unremarkable. IMPRESSION: Slightly increased bilateral lung opacities are noted most consistent with chronic interstitial lung disease with superimposed pulmonary edema or atypical inflammation. Aortic Atherosclerosis (ICD10-I70.0). Electronically Signed   By: Marijo Conception M.D.   On: 05/18/2021 19:59    Procedures Procedures   Medications Ordered in ED Medications  0.9 %  sodium chloride infusion ( Intravenous New Bag/Given 05/18/21 2004)  furosemide (LASIX) injection 40 mg (has no administration in time range)  methylPREDNISolone sodium succinate (SOLU-MEDROL) 125 mg/2 mL injection 125 mg (125 mg Intravenous Given 05/18/21 2003)  albuterol (VENTOLIN HFA) 108 (90 Base) MCG/ACT inhaler 2 puff (2 puffs Inhalation Given 05/18/21 2003)    ED Course  I have reviewed the triage vital signs and the nursing notes.  Pertinent labs & imaging results that were available during my care of the patient were reviewed by me and considered in my medical decision making (see chart for details).    MDM Rules/Calculators/A&P                           Patient Vitals for the past 24 hrs:  BP Pulse Resp SpO2  05/18/21 2339 (!) 160/69 63 19 95 %  05/18/21 2230 (!) 161/71 69 19 91 %  05/18/21 2200 (!) 112/94 70 (!) 23 94 %  05/18/21 2130 (!) 156/82 73 20 92 %  05/18/21 2100 (!) 174/58 64 (!) 23 93 %  05/18/21 2045 -- 65 (!) 26 93 %  05/18/21 2030 (!) 165/85 62 17 94 %  05/18/21 2015 -- 60 (!) 23 93 %  05/18/21 2000 (!) 160/61 61 20 93 %  05/18/21 1945 -- 67 (!) 23 92 %  05/18/21 1930 -- 66 -- (!) 87 %  05/18/21 1915 94/81 83 20 93 %  05/18/21 1900 (!) 151/129 66 -- 91 %  05/18/21 1845 (!) 149/111 67 -- 93 %  05/18/21 1830 (!) 148/68 66 --  100 %  05/18/21 1829 (!) 148/68 62 20 100 %    11:14 PM Reevaluation with update and discussion. After initial assessment and treatment, an updated evaluation she complains of leg swelling and dyspnea on exertion recently.  She is agreeable to hospitalization for further care and treatment.Daleen Bo   Medical Decision Making:  This patient is presenting for evaluation of shortness of breath, ongoing with abnormal chest x-ray, which does require a range of treatment options, and is a complaint that involves a high risk of morbidity and mortality. The differential diagnoses include pneumonia, heart failure, COVID infection. I decided to review old records, and in summary elderly female presenting with ongoing shortness of breath despite using oxygen at home.  EMS found her hypoxic, placed on facemask oxygen which normalized her oxygen status..  I do not require additional historical information from anyone.  Clinical Laboratory Tests Ordered, included CBC, Metabolic panel and Viral panel, BMP, venous blood gas.  Review indicates normal except glucose high, calcium low, albumin low, BNP high, MCV high, PCO2 low, PO2 high. Radiologic Tests Ordered, included chest x-ray.  I independently Visualized: Radiographic images, which show reading per radiology is chronic interstitial lung disease with superimposed pulmonary edema or atypical infection    Critical Interventions-clinical evaluation, laboratory testing, radiography, observation reassessment  After These Interventions, the Patient was reevaluated and was found with shortness of breath, interstitial lung disease and congestive heart failure.  She has a baseline cardiac ejection fraction 40% and is due to have a repeat echo done next week.  She is on home oxygen.  She apparently takes Lasix regularly.  She was sent to the ED today because her chest x-ray was abnormal showing nonspecific infiltrates and chronic disease.  Her cardiologist  wanted to do a cardiac echo which has been ordered but not done yet.  CRITICAL CARE-yes Performed by: Daleen Bo  Nursing Notes Reviewed/ Care Coordinated Applicable Imaging Reviewed Interpretation of Laboratory Data incorporated into ED treatment   12:17 AM-Consult complete with hospitalist. Patient case explained and discussed.  He agrees to admit patient for further evaluation and treatment. Call ended at 12:24 AM    Final Clinical Impression(s) / ED Diagnoses Final diagnoses:  Acute on chronic congestive heart failure, unspecified heart failure type ([place])  Interstitial lung disease ([place])    Rx / DC Orders ED Discharge Orders    None       Daleen Bo, MD 05/19/21 [phone]

## 2021-05-18 NOTE — Telephone Encounter (Signed)
Pt called in and was advised via xray results to go to ED today.  Pt stated she does not have a ride and will not be able to go until at least 9 tonight .  She wanted to know if it was ok to wait til morning because she does not have transportation  Best number   [phone]

## 2021-05-18 NOTE — ED Triage Notes (Signed)
EMS reports from home, increasing SOB x 3 weeks. Xray done this week by GP and sent to ED due to increased blood clots in lungs. Sp02 78 on scene with 3 ltrs, placed on non-rebreather 10ltrs up to 98%  BP 194/70 HR 66 RR 20 Sp02 98 on non-rebreather.  18 R forearm

## 2021-05-18 NOTE — Telephone Encounter (Signed)
Returned call to patient, who states that she was advised by Dr. Gardiner Rhyme to report to the ER for evaluation and states that she doesn't have a ride and is home alone.   Patient checked oxygen level and HR on the phone and currently per patient her oxygen level is 70-72% with HR 80. Advised patient to call EMS and go to ED for evaluation now instead of waiting. Patient states that she is calling them right now and will proceed to ED right now for evaluation.   Advised patient that I would make Dr. Gardiner Rhyme aware. Patient verbalized understanding.

## 2021-05-19 ENCOUNTER — Inpatient Hospital Stay (HOSPITAL_COMMUNITY): Payer: Medicare HMO

## 2021-05-19 ENCOUNTER — Encounter (HOSPITAL_COMMUNITY): Payer: Self-pay | Admitting: Internal Medicine

## 2021-05-19 DIAGNOSIS — J9621 Acute and chronic respiratory failure with hypoxia: Secondary | ICD-10-CM | POA: Diagnosis present

## 2021-05-19 DIAGNOSIS — J84112 Idiopathic pulmonary fibrosis: Secondary | ICD-10-CM | POA: Diagnosis present

## 2021-05-19 DIAGNOSIS — I083 Combined rheumatic disorders of mitral, aortic and tricuspid valves: Secondary | ICD-10-CM | POA: Diagnosis present

## 2021-05-19 DIAGNOSIS — J849 Interstitial pulmonary disease, unspecified: Secondary | ICD-10-CM

## 2021-05-19 DIAGNOSIS — I480 Paroxysmal atrial fibrillation: Secondary | ICD-10-CM

## 2021-05-19 DIAGNOSIS — I739 Peripheral vascular disease, unspecified: Secondary | ICD-10-CM | POA: Diagnosis present

## 2021-05-19 DIAGNOSIS — E119 Type 2 diabetes mellitus without complications: Secondary | ICD-10-CM | POA: Diagnosis not present

## 2021-05-19 DIAGNOSIS — Z8616 Personal history of COVID-19: Secondary | ICD-10-CM | POA: Diagnosis not present

## 2021-05-19 DIAGNOSIS — I272 Pulmonary hypertension, unspecified: Secondary | ICD-10-CM | POA: Diagnosis present

## 2021-05-19 DIAGNOSIS — I472 Ventricular tachycardia: Secondary | ICD-10-CM | POA: Diagnosis not present

## 2021-05-19 DIAGNOSIS — E785 Hyperlipidemia, unspecified: Secondary | ICD-10-CM | POA: Diagnosis present

## 2021-05-19 DIAGNOSIS — I5023 Acute on chronic systolic (congestive) heart failure: Secondary | ICD-10-CM | POA: Diagnosis not present

## 2021-05-19 DIAGNOSIS — Z720 Tobacco use: Secondary | ICD-10-CM | POA: Diagnosis not present

## 2021-05-19 DIAGNOSIS — I5033 Acute on chronic diastolic (congestive) heart failure: Secondary | ICD-10-CM

## 2021-05-19 DIAGNOSIS — I5043 Acute on chronic combined systolic (congestive) and diastolic (congestive) heart failure: Secondary | ICD-10-CM | POA: Diagnosis present

## 2021-05-19 DIAGNOSIS — I509 Heart failure, unspecified: Secondary | ICD-10-CM | POA: Diagnosis not present

## 2021-05-19 DIAGNOSIS — K5900 Constipation, unspecified: Secondary | ICD-10-CM | POA: Diagnosis not present

## 2021-05-19 DIAGNOSIS — I2510 Atherosclerotic heart disease of native coronary artery without angina pectoris: Secondary | ICD-10-CM | POA: Diagnosis present

## 2021-05-19 DIAGNOSIS — T380X5A Adverse effect of glucocorticoids and synthetic analogues, initial encounter: Secondary | ICD-10-CM | POA: Diagnosis not present

## 2021-05-19 DIAGNOSIS — R0602 Shortness of breath: Secondary | ICD-10-CM | POA: Diagnosis present

## 2021-05-19 DIAGNOSIS — Z86711 Personal history of pulmonary embolism: Secondary | ICD-10-CM | POA: Diagnosis not present

## 2021-05-19 DIAGNOSIS — E1165 Type 2 diabetes mellitus with hyperglycemia: Secondary | ICD-10-CM | POA: Diagnosis not present

## 2021-05-19 DIAGNOSIS — I4510 Unspecified right bundle-branch block: Secondary | ICD-10-CM | POA: Diagnosis present

## 2021-05-19 DIAGNOSIS — Z20822 Contact with and (suspected) exposure to covid-19: Secondary | ICD-10-CM | POA: Diagnosis present

## 2021-05-19 DIAGNOSIS — Z7901 Long term (current) use of anticoagulants: Secondary | ICD-10-CM | POA: Diagnosis not present

## 2021-05-19 DIAGNOSIS — I2584 Coronary atherosclerosis due to calcified coronary lesion: Secondary | ICD-10-CM | POA: Diagnosis present

## 2021-05-19 DIAGNOSIS — I110 Hypertensive heart disease with heart failure: Secondary | ICD-10-CM | POA: Diagnosis present

## 2021-05-19 DIAGNOSIS — J439 Emphysema, unspecified: Secondary | ICD-10-CM | POA: Diagnosis present

## 2021-05-19 DIAGNOSIS — E039 Hypothyroidism, unspecified: Secondary | ICD-10-CM

## 2021-05-19 DIAGNOSIS — F17210 Nicotine dependence, cigarettes, uncomplicated: Secondary | ICD-10-CM | POA: Diagnosis present

## 2021-05-19 DIAGNOSIS — I493 Ventricular premature depolarization: Secondary | ICD-10-CM | POA: Diagnosis present

## 2021-05-19 HISTORY — DX: Acute on chronic diastolic (congestive) heart failure: I50.33

## 2021-05-19 LAB — COMPREHENSIVE METABOLIC PANEL
ALT: 17 U/L (ref 0–44)
AST: 33 U/L (ref 15–41)
Albumin: 3 g/dL — ABNORMAL LOW (ref 3.5–5.0)
Alkaline Phosphatase: 50 U/L (ref 38–126)
Anion gap: 7 (ref 5–15)
BUN: 20 mg/dL (ref 8–23)
CO2: 25 mmol/L (ref 22–32)
Calcium: 8.3 mg/dL — ABNORMAL LOW (ref 8.9–10.3)
Chloride: 106 mmol/L (ref 98–111)
Creatinine, Ser: 0.98 mg/dL (ref 0.44–1.00)
GFR, Estimated: 59 mL/min — ABNORMAL LOW (ref >60)
Glucose, Bld: 385 mg/dL — ABNORMAL HIGH (ref 70–99)
Potassium: 5.2 mmol/L — ABNORMAL HIGH (ref 3.5–5.1)
Sodium: 138 mmol/L (ref 135–145)
Total Bilirubin: 1.1 mg/dL (ref 0.3–1.2)
Total Protein: 6.9 g/dL (ref 6.5–8.1)

## 2021-05-19 LAB — CBC WITH DIFFERENTIAL/PLATELET
Abs Immature Granulocytes: 0.02 10*3/uL (ref 0.00–0.07)
Basophils Absolute: 0 10*3/uL (ref 0.0–0.1)
Basophils Relative: 0 %
Eosinophils Absolute: 0 10*3/uL (ref 0.0–0.5)
Eosinophils Relative: 0 %
HCT: 45.6 % (ref 36.0–46.0)
Hemoglobin: 14.3 g/dL (ref 12.0–15.0)
Immature Granulocytes: 0 %
Lymphocytes Relative: 10 %
Lymphs Abs: 0.5 10*3/uL — ABNORMAL LOW (ref 0.7–4.0)
MCH: 33.6 pg (ref 26.0–34.0)
MCHC: 31.4 g/dL (ref 30.0–36.0)
MCV: 107.3 fL — ABNORMAL HIGH (ref 80.0–100.0)
Monocytes Absolute: 0.1 10*3/uL (ref 0.1–1.0)
Monocytes Relative: 1 %
Neutro Abs: 4.8 10*3/uL (ref 1.7–7.7)
Neutrophils Relative %: 89 %
Platelets: 152 10*3/uL (ref 150–400)
RBC: 4.25 MIL/uL (ref 3.87–5.11)
RDW: 15.9 % — ABNORMAL HIGH (ref 11.5–15.5)
WBC: 5.4 10*3/uL (ref 4.0–10.5)
nRBC: 0.4 % — ABNORMAL HIGH (ref 0.0–0.2)

## 2021-05-19 LAB — ECHOCARDIOGRAM COMPLETE
AR max vel: 1.6 cm2
AV Area VTI: 1.84 cm2
AV Area mean vel: 1.49 cm2
AV Mean grad: 8 mmHg
AV Peak grad: 18 mmHg
Ao pk vel: 2.12 m/s
Area-P 1/2: 2.5 cm2
P 1/2 time: 425 msec
S' Lateral: 3 cm

## 2021-05-19 LAB — CBG MONITORING, ED
Glucose-Capillary: 318 mg/dL — ABNORMAL HIGH (ref 70–99)
Glucose-Capillary: 266 mg/dL — ABNORMAL HIGH (ref 70–99)

## 2021-05-19 LAB — PROCALCITONIN: Procalcitonin: <0.1 ng/mL

## 2021-05-19 LAB — GLUCOSE, CAPILLARY
Glucose-Capillary: 167 mg/dL — ABNORMAL HIGH (ref 70–99)
Glucose-Capillary: 163 mg/dL — ABNORMAL HIGH (ref 70–99)

## 2021-05-19 LAB — PHOSPHORUS: Phosphorus: 4.1 mg/dL (ref 2.5–4.6)

## 2021-05-19 LAB — MAGNESIUM: Magnesium: 2.3 mg/dL (ref 1.7–2.4)

## 2021-05-19 LAB — TSH: TSH: 2.028 u[IU]/mL (ref 0.350–4.500)

## 2021-05-19 MED ORDER — INSULIN GLARGINE 100 UNIT/ML ~~LOC~~ SOLN
8.0000 [IU] | Freq: Every day | SUBCUTANEOUS | Status: DC
  Administered 2021-05-19 – 2021-05-20 (×2): 8 [IU] via SUBCUTANEOUS
  Filled 2021-05-19 (×2): qty 0.08

## 2021-05-19 MED ORDER — ALBUTEROL SULFATE (2.5 MG/3ML) 0.083% IN NEBU: 2.5000 mg | INHALATION_SOLUTION | RESPIRATORY_TRACT | Status: DC | PRN

## 2021-05-19 MED ORDER — POTASSIUM CHLORIDE ER 10 MEQ PO TBCR
10.0000 meq | EXTENDED_RELEASE_TABLET | Freq: Every day | ORAL | Status: DC
  Administered 2021-05-20 – 2021-05-22 (×3): 10 meq via ORAL
  Filled 2021-05-19 (×7): qty 1

## 2021-05-19 MED ORDER — INSULIN ASPART 100 UNIT/ML IJ SOLN
0.0000 [IU] | Freq: Three times a day (TID) | INTRAMUSCULAR | Status: DC
  Administered 2021-05-19: 5 [IU] via SUBCUTANEOUS
  Administered 2021-05-19: 7 [IU] via SUBCUTANEOUS
  Administered 2021-05-19: 2 [IU] via SUBCUTANEOUS
  Administered 2021-05-20: 7 [IU] via SUBCUTANEOUS
  Administered 2021-05-20: 2 [IU] via SUBCUTANEOUS
  Administered 2021-05-20: 7 [IU] via SUBCUTANEOUS
  Administered 2021-05-21: 9 [IU] via SUBCUTANEOUS
  Administered 2021-05-21 (×2): 5 [IU] via SUBCUTANEOUS
  Administered 2021-05-22: 3 [IU] via SUBCUTANEOUS
  Filled 2021-05-19: qty 0.09

## 2021-05-19 MED ORDER — UMECLIDINIUM BROMIDE 62.5 MCG/INH IN AEPB
1.0000 | INHALATION_SPRAY | Freq: Every day | RESPIRATORY_TRACT | Status: DC
  Administered 2021-05-19 – 2021-05-22 (×4): 1 via RESPIRATORY_TRACT
  Filled 2021-05-19: qty 7

## 2021-05-19 MED ORDER — FUROSEMIDE 10 MG/ML IJ SOLN
20.0000 mg | Freq: Two times a day (BID) | INTRAMUSCULAR | Status: DC
  Administered 2021-05-19: 20 mg via INTRAVENOUS
  Filled 2021-05-19: qty 4

## 2021-05-19 MED ORDER — PREDNISONE 20 MG PO TABS
40.0000 mg | ORAL_TABLET | Freq: Every day | ORAL | Status: DC
  Administered 2021-05-21 – 2021-05-22 (×2): 40 mg via ORAL
  Filled 2021-05-19 (×3): qty 2

## 2021-05-19 MED ORDER — METHYLPREDNISOLONE SODIUM SUCC 125 MG IJ SOLR
60.0000 mg | INTRAMUSCULAR | Status: AC
  Administered 2021-05-19 – 2021-05-20 (×2): 60 mg via INTRAVENOUS
  Filled 2021-05-19 (×2): qty 2

## 2021-05-19 MED ORDER — FUROSEMIDE 10 MG/ML IJ SOLN
20.0000 mg | Freq: Once | INTRAMUSCULAR | Status: AC
  Administered 2021-05-19: 20 mg via INTRAVENOUS
  Filled 2021-05-19: qty 4

## 2021-05-19 MED ORDER — ARFORMOTEROL TARTRATE 15 MCG/2ML IN NEBU
15.0000 ug | INHALATION_SOLUTION | Freq: Two times a day (BID) | RESPIRATORY_TRACT | Status: DC
  Administered 2021-05-19 – 2021-05-22 (×7): 15 ug via RESPIRATORY_TRACT
  Filled 2021-05-19 (×8): qty 2

## 2021-05-19 MED ORDER — METOPROLOL SUCCINATE ER 25 MG PO TB24
12.5000 mg | ORAL_TABLET | Freq: Every day | ORAL | Status: DC
  Administered 2021-05-19 – 2021-05-21 (×3): 12.5 mg via ORAL
  Filled 2021-05-19: qty 0.5
  Filled 2021-05-19 (×2): qty 1

## 2021-05-19 MED ORDER — LEVOTHYROXINE SODIUM 75 MCG PO TABS
75.0000 ug | ORAL_TABLET | Freq: Every day | ORAL | Status: DC
  Administered 2021-05-19 – 2021-05-22 (×4): 75 ug via ORAL
  Filled 2021-05-19 (×4): qty 1

## 2021-05-19 MED ORDER — APIXABAN 5 MG PO TABS
5.0000 mg | ORAL_TABLET | Freq: Two times a day (BID) | ORAL | Status: DC
  Administered 2021-05-19 – 2021-05-22 (×7): 5 mg via ORAL
  Filled 2021-05-19 (×7): qty 1

## 2021-05-19 MED ORDER — ACETAMINOPHEN 650 MG RE SUPP: 650.0000 mg | Freq: Four times a day (QID) | RECTAL | Status: DC | PRN

## 2021-05-19 MED ORDER — FUROSEMIDE 10 MG/ML IJ SOLN
40.0000 mg | Freq: Once | INTRAMUSCULAR | Status: AC
  Administered 2021-05-19: 40 mg via INTRAVENOUS
  Filled 2021-05-19: qty 4

## 2021-05-19 MED ORDER — ACETAMINOPHEN 325 MG PO TABS
650.0000 mg | ORAL_TABLET | Freq: Four times a day (QID) | ORAL | Status: DC | PRN
  Administered 2021-05-21: 650 mg via ORAL
  Filled 2021-05-19: qty 2

## 2021-05-19 MED ORDER — BUDESONIDE 0.25 MG/2ML IN SUSP: 0.2500 mg | Freq: Two times a day (BID) | RESPIRATORY_TRACT | Status: DC

## 2021-05-19 MED ORDER — PREDNISONE 20 MG PO TABS: 20.0000 mg | ORAL_TABLET | Freq: Every day | ORAL | Status: DC

## 2021-05-19 MED ORDER — PREDNISONE 5 MG PO TABS: 30.0000 mg | ORAL_TABLET | Freq: Every day | ORAL | Status: DC

## 2021-05-19 MED ORDER — BUDESONIDE 0.5 MG/2ML IN SUSP
0.5000 mg | Freq: Two times a day (BID) | RESPIRATORY_TRACT | Status: DC
  Administered 2021-05-19 – 2021-05-22 (×6): 0.5 mg via RESPIRATORY_TRACT
  Filled 2021-05-19 (×6): qty 2

## 2021-05-19 MED ORDER — PREDNISONE 5 MG PO TABS: 10.0000 mg | ORAL_TABLET | Freq: Every day | ORAL | Status: DC

## 2021-05-19 MED ORDER — FUROSEMIDE 10 MG/ML IJ SOLN
40.0000 mg | Freq: Two times a day (BID) | INTRAMUSCULAR | Status: DC
  Administered 2021-05-19 – 2021-05-20 (×3): 40 mg via INTRAVENOUS
  Filled 2021-05-19 (×3): qty 4

## 2021-05-19 MED ORDER — PRAVASTATIN SODIUM 40 MG PO TABS
40.0000 mg | ORAL_TABLET | Freq: Every day | ORAL | Status: DC
  Administered 2021-05-19 – 2021-05-21 (×3): 40 mg via ORAL
  Filled 2021-05-19 (×3): qty 1

## 2021-05-19 MED ORDER — NYSTATIN 100000 UNIT/ML MT SUSP
5.0000 mL | Freq: Four times a day (QID) | OROMUCOSAL | Status: DC
  Administered 2021-05-19 – 2021-05-22 (×11): 500000 [IU] via ORAL
  Filled 2021-05-19 (×10): qty 5
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
## 2021-05-19 NOTE — ED Notes (Signed)
CBG- 266

## 2021-05-19 NOTE — Consult Note (Addendum)
Cardiology Consultation:   Patient ID: Elizabeth Melton MRN: [id number]; DOB: 1941-04-22  Admit date: 05/18/2021 Date of Consult: 05/19/2021  PCP:  Elizabeth Lass, MD   [facility] Providers Cardiologist:  Elizabeth Heinz, MD        Patient Profile:   Elizabeth Melton is a 80 y.o. female with a hx of submassive PE, interstitial lung disease, diabetes, hyperlipidemia, HFpEF who was admitted on 01/16/2021 with COVID-19 pneumonia, atrial fim on BB and eliquis, mod MR, EF 40-45% mild to mod TR moderate AI, mildly reduced RV function, who is being seen 05/19/2021 for the evaluation of CHF at the request of Elizabeth Melton.  History of Present Illness:   Elizabeth Melton with above hx and recent visit with Elizabeth Melton 04/29/21. Her initial SP02 was 70% on RA.  She was asked to go to ER but declined.  With 02 (she did not bring her home 02) she improved to high 80s and low 90s.  She has stopped smoking.  She is taking lasix about 3 times per week and was instructed to take daily.  She saw PCP yesterday and CXR with Slightly increased bilateral lung opacities are noted most consistent with chronic interstitial lung disease with superimposed pulmonary edema or atypical inflammation.  Hx of neg nuc study in 2019 done for DOE.  She is noted to have coronary calcium on LM, LAD and RCA on low dose chest CT done 08/2018, again seen 01/2021.   She presented to ER yesterday with SOB, concern for PE, but hypoxia was present.  In ER given lasix, solumedrol and albuterol inhaler.  She has been admitted.    DOE begins at 10-15 feet.  + nocturnal dyspnea but lasts 10-15 min and she can then lie back down.     EKG:  The EKG was personally reviewed and demonstrates:  yesterday EKG SR with signifcant artifact.  Today SR 66 with PVC and incomplete RBBB and mild ST depression in inf leads.  Telemetry:  Telemetry was personally reviewed and demonstrates:  SR Echo Pending.   Na 138, K+ 5.2 BUN 20 Cr 0.98 Mg+ 2.3  glucose  385 BNP 509  No troponin,  Hgb 14.3 WBC 5.4 and Plts 152 TSH 2.028 Neg COVID and Flu  BP 150/55 P 72 R 23  afebrile on 02 5L with spo2 98% Currently feeling better but not as much urine output.  Past Medical History:  Diagnosis Date   Acute on chronic systolic (congestive) heart failure ([place]) 05/19/2021   Diabetes mellitus without complication (HCC)    Hyperlipidemia    Hypertension    Hypothyroidism    Thrombocytopenia (HCC)     Past Surgical History:  Procedure Laterality Date   TUBAL LIGATION       Home Medications:  Prior to Admission medications   Medication Sig Start Date End Date Taking? Authorizing Provider  acetaminophen (TYLENOL) 500 MG tablet Take 500 mg by mouth daily as needed for headache (pain).    Yes [provider]  apixaban (ELIQUIS) 5 MG TABS tablet Take 1 tablet (5 mg total) by mouth 2 (two) times daily. 10/11/20  Yes Elizabeth Riding, MD  b complex vitamins tablet Take 1 tablet by mouth daily.   Yes [provider]  Calcium Carb-Cholecalciferol (CALCIUM + D3) 600-200 MG-UNIT TABS Take 2 tablets by mouth daily.    Yes [provider]  cholecalciferol (VITAMIN D3) 25 MCG (1000 UT) tablet Take 1,000 Units by mouth daily.   Yes  [provider]  furosemide (LASIX) 20 MG tablet Take one tablet by mouth daily Patient taking differently: Take 20 mg by mouth daily. 04/25/21  Yes Elizabeth Needs, NP  levothyroxine (SYNTHROID, LEVOTHROID) 75 MCG tablet Take 75 mcg by mouth daily before breakfast.    Yes [provider]  lovastatin (MEVACOR) 40 MG tablet Take 40 mg by mouth at bedtime.   Yes [provider]  metoprolol succinate (TOPROL-XL) 25 MG 24 hr tablet Take 0.5 tablets (12.5 mg total) by mouth daily. 01/28/21  Yes Elizabeth Dee, MD  nicotine (NICODERM CQ - DOSED IN MG/24 HOURS) 21 mg/24hr patch Place 21 mg onto the skin daily.   Yes [provider]  Omega-3 Fatty Acids (FISH OIL PO) Take 1 capsule by  mouth daily.   Yes [provider]  potassium chloride (KLOR-CON) 10 MEQ tablet TAKE 1 TABLET BY MOUTH EVERY DAY Patient taking differently: Take 10 mEq by mouth daily. 04/25/21  Yes Elizabeth Needs, NP  Tiotropium Bromide-Olodaterol (STIOLTO RESPIMAT) 2.5-2.5 MCG/ACT AERS Inhale 2 puffs into the lungs daily. 10/19/20  Yes Elizabeth Males, MD  Blood Glucose Monitoring Suppl (TRUE METRIX METER) w/Device KIT 1 each by Other route in the morning and at bedtime. 12/25/20   [provider]  glucose blood test strip 1 each by Other route as needed for other (blood sugar).    [provider]  SMART SENSE THIN LANCETS 26G MISC 1 each by Does not apply route 2 (two) times daily.    [provider]    Inpatient Medications: Scheduled Meds:  apixaban  5 mg Oral BID   arformoterol  15 mcg Nebulization BID   furosemide  20 mg Intravenous BID   insulin aspart  0-9 Units Subcutaneous TID WC   levothyroxine  75 mcg Oral Q0600   metoprolol succinate  12.5 mg Oral Daily   potassium chloride  10 mEq Oral Daily   pravastatin  40 mg Oral q1800   umeclidinium bromide  1 puff Inhalation Daily   Continuous Infusions:  PRN Meds: acetaminophen **OR** acetaminophen, albuterol  Allergies:   No Known Allergies  Social History:   Social History   Socioeconomic History   Marital status: Married    Spouse name: Not on file   Number of children: Not on file   Years of education: Not on file   Highest education level: Not on file  Occupational History   Not on file  Tobacco Use   Smoking status: Every Day    Packs/day: 0.75    Years: 60.00    Pack years: 45.00    Types: Cigarettes   Smokeless tobacco: Former    Quit date: 09/24/1962   Tobacco comments:    1 pack lasts 4 days  Vaping Use   Vaping Use: Never used  Substance and Sexual Activity   Alcohol use: Yes   Drug use: No   Sexual activity: Not on file  Other Topics Concern   Not on file  Social History  Narrative   Not on file   Social Determinants of Health   Financial Resource Strain: Not on file  Food Insecurity: Not on file  Transportation Melton: Not on file  Physical Activity: Not on file  Stress: Not on file  Social Connections: Not on file  Intimate Partner Violence: Not on file    Family History:    Family History  Problem Relation Age of Onset   Diabetes Mother    Kidney disease  Mother    Hypertension Mother    Other Father        tuberculosis   Hypertension Sister    Hyperlipidemia Sister      ROS:  Please see the history of present illness.  General:no colds or fevers, no weight changes Skin:no rashes or ulcers HEENT:no blurred vision, no congestion CV:see HPI PUL:see HPI GI:no diarrhea constipation or melena, no indigestion GU:no hematuria, no dysuria MS:no joint pain, no claudication Neuro:no syncope, no lightheadedness Endo:no diabetes, no thyroid disease  All other ROS reviewed and negative.     Physical Exam/Data:   Vitals:   05/19/21 0930 05/19/21 1000 05/19/21 1030 05/19/21 1045  BP: (!) 155/66 (!) 151/65 (!) 148/57   Pulse: 67 72 67   Resp: 18 (!) 22 (!) 23 (!) 23  Temp:      TempSrc:      SpO2: 93% 91% 98%     Intake/Output Summary (Last 24 hours) at 05/19/2021 1118 Last data filed at 05/19/2021 1002 Gross per 24 hour  Intake --  Output 1000 ml  Net -1000 ml   Last 3 Weights 04/29/2021 02/17/2021 01/24/2021  Weight (lbs) 174 lb 181 lb 172 lb 13.5 oz  Weight (kg) 78.926 kg 82.101 kg 78.4 kg     There is no height or weight on file to calculate BMI.  General:  Well nourished, well developed, in no acute distress though easily dyspneic with talking HEENT: normal Lymph: no adenopathy Neck: mild JVD Endocrine:  No thryomegaly Vascular: No carotid bruits; pedal pulses 1+ bilaterally  Cardiac:  normal S1, S2; RRR; 1-2 systolic murmur no gallup rub or click Lungs:  diminished with rhonchi to auscultation bilaterally, no wheezing, or rales   Abd: soft, nontender, no hepatomegaly  Ext: tr edema Musculoskeletal:  No deformities, BUE and BLE strength normal and equal Skin: warm and dry  Neuro:  alert and oriented X 3 MAE follows commands,  no focal abnormalities noted Psych:  Normal affect    Relevant CV Studies: Echo 01/22/21 IMPRESSIONS     1. Left ventricular ejection fraction, by estimation, is 40 to 45%. The  left ventricle has mildly decreased function. The left ventricle  demonstrates global hypokinesis. There is mild concentric left ventricular  hypertrophy. Diastolic function is  indeterminant due to Afib.   2. Right ventricular systolic function is mildly reduced. The right  ventricular size is normal. There is mildly elevated pulmonary artery  systolic pressure.   3. Left atrial size was severely dilated.   4. Right atrial size was severely dilated.   5. The mitral valve is abnormal. Moderate mitral valve regurgitation.  Moderate mitral annular calcification.   6. Tricuspid valve regurgitation is mild to moderate.   7. The aortic valve is tricuspid. There is moderate calcification of the  aortic valve. There is moderate thickening of the aortic valve. Aortic  valve regurgitation is moderate. Mild aortic valve stenosis.   8. The inferior vena cava is dilated in size with <50% respiratory  variability, suggesting right atrial pressure of 15 mmHg.   Comparison(s): Compared to prior TTE, the LVEF appears depressed to  40-45%, there is now moderate MR and AI. Patient is also in Afib currently  with rapid ventricular response.   Laboratory Data:  High Sensitivity Troponin:  No results for input(s): TROPONINIHS in the last 720 hours.   Chemistry Recent Labs  Lab 05/18/21 1958 05/19/21 0554  NA 140 138  K 3.5 5.2*  CL 108 106  CO2  25 25  GLUCOSE 138* 385*  BUN 20 20  CREATININE 0.85 0.98  CALCIUM 8.5* 8.3*  GFRNONAA >60 59*  ANIONGAP 7 7    Recent Labs  Lab 05/18/21 1958 05/19/21 0554  PROT 7.1  6.9  ALBUMIN 3.2* 3.0*  AST 24 33  ALT 20 17  ALKPHOS 55 50  BILITOT 1.0 1.1   Hematology Recent Labs  Lab 05/18/21 1958 05/19/21 0554  WBC 6.9 5.4  RBC 4.13 4.25  HGB 14.0 14.3  HCT 44.7 45.6  MCV 108.2* 107.3*  MCH 33.9 33.6  MCHC 31.3 31.4  RDW 15.9* 15.9*  PLT 150 152   BNP Recent Labs  Lab 05/18/21 1958  BNP 509.8*    DDimer No results for input(s): DDIMER in the last 168 hours.   Radiology/Studies:  DG Chest 2 View  Result Date: 05/16/2021 CLINICAL DATA:  Shortness of breath. EXAM: CHEST - 2 VIEW COMPARISON:  Chest x-ray dated March 22, 2021. FINDINGS: Unchanged mild cardiomegaly. Mid and basilar predominant interstitial thickening and hazy airspace opacity, similar to prior study. No pleural effusion or pneumothorax. No acute osseous abnormality. IMPRESSION: 1. Unchanged chronic interstitial lung disease with superimposed pulmonary edema or multifocal pneumonia. Electronically Signed   By: Titus Dubin M.D.   On: 05/16/2021 12:02   DG Chest Port 1 View  Result Date: 05/18/2021 CLINICAL DATA:  Shortness of breath. EXAM: PORTABLE CHEST 1 VIEW COMPARISON:  May 16, 2021. FINDINGS: Stable cardiomediastinal silhouette. No pneumothorax is noted. Slightly increased bilateral reticular densities are noted throughout both lungs, most consistent with chronic interstitial lung disease with superimposed pulmonary edema or atypical inflammation. Bony thorax is unremarkable. IMPRESSION: Slightly increased bilateral lung opacities are noted most consistent with chronic interstitial lung disease with superimposed pulmonary edema or atypical inflammation. Aortic Atherosclerosis (ICD10-I70.0). Electronically Signed   By: Marijo Conception M.D.   On: 05/18/2021 19:59     Assessment and Plan:   Acute on chronic combined systolic and diastolic HF in combination with interstitial lung disease - she is neg 1000 ml currently after Lasix 40 in ER IV and now on Lasix 20 mg IV BID.  Cr is  stable.  On admit with elevated BNP.  Echo is pending.  Hx of EF 40-45% with mild RV failure while in a fib in Feb 2022.  Will increase lasix to 40 mg BID IV for now.  Interstitial lung disease and chronic hypoxia, pt not always compliant with 02 and decompensates.  IM is addressing with solumedrol and currently on higher 02 requirement than at home. PAF in hospital with COVID 19 PNA 01/2021 - she converted to SR on amiodarone but with lung issues amio was stopped. On eliquis and torpol XL 12.5 daily  did have Bradycardia with amio and BB.   Moderate AR and Moderate MR. Will re-eval on echo Hx of coronary calcifications on low dose CT chest in 2019 on LM, LAD and RCA. Seen again in 2022. Hx tobacco use but has stopped smoking.     Risk Assessment/Risk Scores:        New York Heart Association (NYHA) Functional Class NYHA Class IV        For questions or updates, please contact [facility] Please consult [email or web address] for contact info under    Signed, Cecilie Kicks, NP  05/19/2021 11:18 AM  Patient examined chart reviewed Discussed care with patient and PA. Exam with chronically ill black female Borderline sats 88-90% at rest 2L's ILD crackles and rhonchi diffuse  no murmur abdomen soft Not particularly volume overloaded with trace bilateral LE edema. She is in NSR CXR with ILD possibly mild superimposed edema. This appears mostly to be from her ILD chronic lung disease, history of PE and recent COVID Insults to her lung. Can Rx with iv lasix 40 mg iv bid for 48 hours keep on dry side. Historically EF 40-45% 01/2021 TTE while in afib.   Jenkins Rouge MD [facility]

## 2021-05-19 NOTE — ED Notes (Signed)
Echo at bedside.

## 2021-05-19 NOTE — Progress Notes (Signed)
  Echocardiogram 2D Echocardiogram has been performed.  Elizabeth Melton 05/19/2021, 11:59 AM

## 2021-05-19 NOTE — Progress Notes (Signed)
Patient is with history of pulmonary fibrosis and heart failure presented with acute on chronic hypoxic respiratory failure likely from acute exacerbation of heart failure, cannot rule out progression of pulmonary fibrosis, she is diuresing well on Lasix, report feeling better, still requiring higher amount of oxygen, lower extremity remains edematous, cardiology and pulmonary consulted

## 2021-05-19 NOTE — H&P (Addendum)
History and Physical    PLEASE NOTE THAT DRAGON DICTATION SOFTWARE WAS USED IN THE CONSTRUCTION OF THIS NOTE.   Elizabeth Melton MPN:[id number] DOB: [birth year] DOA: 05/18/2021  PCP: Kathyrn Lass, MD Patient coming from: home   I have personally briefly reviewed patient's old medical records in [place]  Chief Complaint: Shortness of breath  HPI: Elizabeth Melton is a 80 y.o. female with medical history significant for chronic hypoxic respiratory failure on continuous 2 L nasal cannula, chronic systolic heart failure with most recent echocardiogram in February 2022 showing LVEF 40 to 45%, paroxysmal atrial fibrillation chronically anticoagulated on Eliquis, acute pulmonary embolism in 2021 prior to initiation of Eliquis, hyperlipidemia, chronic interstitial lung disease, who is admitted to [facility] on 05/18/2021 with acute on chronic hypoxic respiratory failure in the setting of acute on chronic systolic heart failure after presenting from home to [facility] ED complaining of shortness of breath.   The patient reports 2 weeks of progressive shortness of breath associated with orthopnea, PND, and worsening of edema in the bilateral lower extremities, worse on the left lower extremity.  She is unsure as to any recent trend in her weight, as she acknowledges that she does not routinely weigh herself at home.  Reports progression of the above symptoms and spite of compliance with her home diuretic regimen which consists of Lasix 20 mg p.o. daily, as well as no significant improvement in the above symptoms in spite of following her outpatient cardiologist recommendation to double her home Lasix dose over the last 3 to 4 days.  Denies any associated recent chest pain, diaphoresis, palpitations, nausea, vomiting, presyncope, or syncope.  Notes associated nonproductive cough, but denies any recent hemoptysis, new lower extremity erythema, or calf tenderness.  No recent trauma or travel.  Denies  any recent melena or hematochezia.  Denies any associated subjective fever, chills, rigors, or generalized myalgias. No recent headache, neck stiffness, rhinitis, rhinorrhea, sore throat, abdominal pain, diarrhea, or rash. No known recent COVID-19 exposures. Denies dysuria, gross hematuria, or change in urinary urgency/frequency.   Most recent echocardiogram was performed in February 2022 and was notable for LVEF 40 to 45%, with left ventricular demonstrating global hypokinesis, mild concentric LVH, indeterminate diastolic function, right ventricular systolic function found to be mildly reduced, bilateral atria noted to be severely dilated, moderate mitral regurgitation, and mild to moderate tricuspid regurgitation.  The patient confirms history of chronic hypoxic respiratory failure, noting good compliance on her baseline continues 2 L nasal cannula.  In the setting of a history of paroxysmal atrial fibrillation, she also conveys outstanding compliance with her chronic take anticoagulation via Eliquis.  In the setting of progression of the above symptoms, her outpatient cardiologist were chest x-ray 2 days ago, which demonstrated interval worsening of pulmonary edema.  In the setting of further progression of her shortness of breath, edema in the bilateral lower extremities, orthopnea, and PND and spite of doubling of home Lasix dose over the last 3 to 4 days, the patient presents to [facility] emergency department this evening for further evaluation management of the above    ED Course:  Vital signs in the ED were notable for the following: Heart rate 60-70; blood pressure 112/94 -156/82; respiratory rate 19-26; initial oxygen saturation noted to be 87% on baseline 2 L nasal cannula, with ensuing improvement to 95% on 4 L nasal cannula.  Labs were notable for the following: CMP was notable for the following: Sodium 140 compared to most  recent prior value of 142 on 04/29/2021, potassium 3.5,  bicarbonate 25, creatinine 0.85, glucose 138, albumin 3.2, otherwise, liver enzymes were found to be within normal limits.  BNP 510 relative to most recent prior value 320 on 04/29/2021.  CBC notable for white blood cell count of 6900, hemoglobin 14.  Nasopharyngeal COVID-19/influenza PCR were performed in the ED this evening and found to be negative.  Interpretation of EKG performed in the ED this evening was significantly limited by the presence of artifact, but, within these confines, appears to demonstrate sinus rhythm with PVC, without overt T wave or ST changes.  Chest x-ray shows interval worsening of pulmonary edema superimposed on chronic interstitial lung disease, without pleural effusion or pneumothorax.  While in the ED, the following were administered: Lasix 40 mg IV x1.  Subsequently, the patient was admitted for further evaluation management of presenting acute on chronic hypoxic respiratory failure in the setting of suspected acute on chronic systolic heart failure.     Review of Systems: As per HPI otherwise 10 point review of systems negative.   Past Medical History:  Diagnosis Date   Acute on chronic systolic (congestive) heart failure ([place]) 05/19/2021   Diabetes mellitus without complication (HCC)    Hyperlipidemia    Hypertension    Hypothyroidism    Thrombocytopenia ([place])     Past Surgical History:  Procedure Laterality Date   TUBAL LIGATION      Social History:  reports that she has been smoking cigarettes. She has a 45.00 pack-year smoking history. She quit smokeless tobacco use about 58 years ago. She reports current alcohol use. She reports that she does not use drugs.   No Known Allergies  Family History  Problem Relation Age of Onset   Diabetes Mother    Kidney disease Mother    Hypertension Mother    Other Father        tuberculosis   Hypertension Sister    Hyperlipidemia Sister     Family history reviewed and not pertinent    Prior to Admission  medications   Medication Sig Start Date End Date Taking? Authorizing Provider  acetaminophen (TYLENOL) 500 MG tablet Take 500 mg by mouth daily as needed for headache (pain).     [provider]  apixaban (ELIQUIS) 5 MG TABS tablet Take 1 tablet (5 mg total) by mouth 2 (two) times daily. 10/11/20   Mercy Riding, MD  b complex vitamins tablet Take 1 tablet by mouth daily.    [provider]  Blood Glucose Monitoring Suppl (TRUE METRIX METER) w/Device KIT 1 each by Other route in the morning and at bedtime. 12/25/20   [provider]  Calcium Carb-Cholecalciferol (CALCIUM + D3) 600-200 MG-UNIT TABS Take 2 tablets by mouth daily.     [provider]  cholecalciferol (VITAMIN D3) 25 MCG (1000 UT) tablet Take 1,000 Units by mouth daily.    [provider]  furosemide (LASIX) 20 MG tablet Take one tablet by mouth daily 04/25/21   [name], NP  glucose blood test strip 1 each by Other route as needed for other (blood sugar).    [provider]  levothyroxine (SYNTHROID, LEVOTHROID) 75 MCG tablet Take 75 mcg by mouth daily before breakfast.     [provider]  lovastatin (MEVACOR) 40 MG tablet Take 40 mg by mouth at bedtime.    [provider]  metoprolol succinate (TOPROL-XL) 25 MG 24 hr tablet Take 0.5 tablets (12.5 mg total)  by mouth daily. 01/28/21   [name], MD  nicotine (NICODERM CQ - DOSED IN MG/24 HOURS) 21 mg/24hr patch 1 patch to skin    [provider]  Omega-3 Fatty Acids (FISH OIL PO) Take 1 capsule by mouth daily.    [provider]  potassium chloride (KLOR-CON) 10 MEQ tablet TAKE 1 TABLET BY MOUTH EVERY DAY 04/25/21   [name], NP  SMART SENSE THIN LANCETS 26G MISC 1 each by Does not apply route 2 (two) times daily.    [provider]  Tiotropium Bromide-Olodaterol (STIOLTO RESPIMAT) 2.5-2.5 MCG/ACT AERS Inhale 2 puffs into the lungs daily. 10/19/20   Brand Males, MD      Objective    Physical Exam: Vitals:   05/18/21 2200 05/18/21 2230 05/18/21 2339 05/19/21 0000  BP: (!) 112/94 (!) 161/71 (!) 160/69 (!) 169/80  Pulse: 70 69 63 60  Resp: (!) _0 SpO2: 94% 91% 95% 95%    General: appears to be stated age; alert, oriented; mildly increased work of breathing noted.  Skin: warm, dry, no rash Head:  AT/[place] Mouth:  Oral mucosa membranes appear moist, normal dentition Neck: supple; trachea midline Heart:  RRR; did not appreciate any M/R/G Lungs: Bilateral crackles noted;  did not appreciate any wheezes, or rhonchi Abdomen: + BS; soft, ND, NT Vascular: 2+ pedal pulses b/l; 2+ radial pulses b/l Extremities: 2+ edema in the bilateral lower extremities, left greater than right ; no muscle wasting Neuro: strength and sensation intact in upper and lower extremities b/l     Labs on Admission: I have personally reviewed following labs and imaging studies  CBC: Recent Labs  Lab 05/18/21 1958  WBC 6.9  NEUTROABS 4.5  HGB 14.0  HCT 44.7  MCV 108.2*  PLT 491   Basic Metabolic Panel: Recent Labs  Lab 05/18/21 1958  NA 140  K 3.5  CL 108  CO2 25  GLUCOSE 138*  BUN 20  CREATININE 0.85  CALCIUM 8.5*   GFR: CrCl cannot be calculated (Unknown ideal weight.). Liver Function Tests: Recent Labs  Lab 05/18/21 1958  AST 24  ALT 20  ALKPHOS 55  BILITOT 1.0  PROT 7.1  ALBUMIN 3.2*   No results for input(s): LIPASE, AMYLASE in the last 168 hours. No results for input(s): AMMONIA in the last 168 hours. Coagulation Profile: No results for input(s): INR, PROTIME in the last 168 hours. Cardiac Enzymes: No results for input(s): CKTOTAL, CKMB, CKMBINDEX, TROPONINI in the last 168 hours. BNP (last 3 results) No results for input(s): PROBNP in the last 8760 hours. HbA1C: No results for input(s): HGBA1C in the last 72 hours. CBG: No results for input(s): GLUCAP in the last 168 hours. Lipid Profile: No results for input(s): CHOL,  HDL, LDLCALC, TRIG, CHOLHDL, LDLDIRECT in the last 72 hours. Thyroid Function Tests: No results for input(s): TSH, T4TOTAL, FREET4, T3FREE, THYROIDAB in the last 72 hours. Anemia Panel: No results for input(s): VITAMINB12, FOLATE, FERRITIN, TIBC, IRON, RETICCTPCT in the last 72 hours. Urine analysis:    Component Value Date/Time   COLORURINE AMBER (A) 01/25/2021 1700   APPEARANCEUR CLOUDY (A) 01/25/2021 1700   LABSPEC 1.027 01/25/2021 1700   PHURINE 6.0 01/25/2021 1700   GLUCOSEU NEGATIVE 01/25/2021 1700   HGBUR MODERATE (A) 01/25/2021 1700   BILIRUBINUR NEGATIVE 01/25/2021 1700   KETONESUR NEGATIVE 01/25/2021 1700   PROTEINUR 30 (A) 01/25/2021 1700   NITRITE NEGATIVE 01/25/2021 1700   LEUKOCYTESUR TRACE (A) 01/25/2021 1700  Radiological Exams on Admission: DG Chest Port 1 View  Result Date: 05/18/2021 CLINICAL DATA:  Shortness of breath. EXAM: PORTABLE CHEST 1 VIEW COMPARISON:  May 16, 2021. FINDINGS: Stable cardiomediastinal silhouette. No pneumothorax is noted. Slightly increased bilateral reticular densities are noted throughout both lungs, most consistent with chronic interstitial lung disease with superimposed pulmonary edema or atypical inflammation. Bony thorax is unremarkable. IMPRESSION: Slightly increased bilateral lung opacities are noted most consistent with chronic interstitial lung disease with superimposed pulmonary edema or atypical inflammation. Aortic Atherosclerosis (ICD10-I70.0). Electronically Signed   By: Marijo Conception M.D.   On: 05/18/2021 19:59     EKG: Independently reviewed, with result as described above.    Assessment/Plan   Elizabeth ROMANIELLO is a 80 y.o. female with medical history significant for chronic hypoxic respiratory failure on continuous 2 L nasal cannula, chronic systolic heart failure with most recent echocardiogram in February 2022 showing LVEF 40 to 45%, paroxysmal atrial fibrillation chronically anticoagulated on Eliquis, acute  pulmonary embolism in 2021 prior to initiation of Eliquis, hyperlipidemia, chronic interstitial lung disease, who is admitted to [facility] on 05/18/2021 with acute on chronic hypoxic respiratory failure in the setting of acute on chronic systolic heart failure after presenting from home to [facility] ED complaining of shortness of breath.    Principal Problem:   Acute on chronic respiratory failure with hypoxia (HCC) Active Problems:   Hypothyroidism   Hyperlipidemia   Diabetes mellitus without complication (HCC)   Tobacco abuse   Acute on chronic systolic (congestive) heart failure (HCC)   SOB (shortness of breath)   AF (paroxysmal atrial fibrillation) ([place])     #) Acute on chronic hypoxic respiratory failure due to acute on chronic systolic heart failure: In the setting of chronic hypoxic respiratory failure on continuous 2 L nasal cannula as an outpatient, dx of acute decompensation on the basis of presenting progressive shortness of breath associated with orthopnea, PND, worsening of peripheral edema, interval elevation in BNP, with chest x-ray showing interval worsening of pulmonary edema.  This is also associated with initial hypoxia on baseline 2 L nasal cannula, with oxygen saturations subsequently improving into the mid 90s on 4 L nasal cannula. this is in the context of a known history of chronic systolic heart failure, with most recent echocardiogram performed in February 2022, with results notable for LVEF 40 to 45%, and additional associated results as further detailed above. Etiology leading to presenting acutely decompensated heart failure not currently clear. Patient conveys good compliance with home diuretic therapy, which consists of Lasix 20 mg p.o. daily, as well as good compliance with their additional home cardiac medications, which include Toprol-XL. Overall, ACS leading to presenting acutely decompensated heart failure appears less likely at this time in the absence of any  recent CP, presenting EKG showing no evidence of acute ischemic changes, although will repeat EKG in the setting presents confounding artifact limiting interpretation of such.   Of note, patient received Lasix 40 mg IV x1 while in the ED today. Presentation warrants additional IV diuresis, as further detailed below, with close monitoring of ensuing renal function, electrolytes, and volume status, as further noted below. Will also closely monitor ensuing HR as an additional means to evaluate volume status and help guide subsequent diuresis decision-making. As patient is already on a BB at home, will plan to continue this. Of note, I utilized the Heart Failure order set to assist with my placement of orders on this patient.   In terms  of other potential etiologies leading to acute on chronic hypoxic respiratory failure, clinically and radiographically, presentation appears less consistent with pneumonia.  We will add on procalcitonin level to further evaluate.  Additionally, presentation is less suggestive of acute pulmonary embolism, which is further less likely relative to the above in the context of the patient's emphasis of good compliance on her chronic anticoagulation via home Eliquis.  Additionally, nasopharyngeal COVID-19/influenza PCR performed in the ED today were found to be negative.     Plan: monitor strict I's & O's and daily weights. Monitor on telemetry, including trend in HR in response to diuresis, as above. Monitor continuous pulse oximetry. Repeat BMP in the morning, including for monitoring trend of potassium, bicarbonate, and renal function in response to interval diuresis efforts. Add-on serum magnesium level, and repeat this level in the AM. Close monitoring of ensuing blood pressure response to diuresis efforts, including to help guide need for improvement in afterload reduction in order to optimize cardiac output. Lasix 20 mg IV twice daily, which will represent a fourfold increase  in effective dosing relative to her baseline Lasix 20 mg p.o. daily as an outpatient. Continue outpatient BB.  Echocardiogram is been ordered for the morning.  Continue home daily potassium supplementation.  Repeat EKG, as above.  Check procalcitonin, as above.  Check serum phosphorus level.        #) Type 2 diabetes mellitus: Presenting blood sugar noted to be 138.  Plan: Accu-Cheks before every meal and at bedtime with low-dose sliding scale insulin.     #) Chronic tobacco abuse: The patient acknowledges that she has a current smoker.  Plan: Counseled the patient on the importance of complete smoking discontinuation by particular in the setting of chronic hypoxic respiratory failure as well as chronic interstitial lung disease.       #) Paroxysmal atrial fibrillation: Documented history of such. In the setting of a CHA2DS2-VASc score of 8, there is an indication for the patient to be on chronic anticoagulation for thromboembolic prophylaxis. Consistent with this, the patient is chronically anticoagulated on Eliquis. Home AV nodal blocking regimen: Toprol-XL.  Most recent echocardiogram was performed in February 2022, with additional results as noted above, including evidence of severely dilated bilateral atria. Appears to be in normal sinus rhythm at this time, although will repeat EKG for further evaluation of the setting of limited interpretation of initial EKG due to associated artifact, as further detailed above   Plan: monitor strict I's & O's and daily weights. Repeat BMP and CBC in the morning. Check serum magnesium level. Continue home AV nodal blocking regimen, as above.  Evaluation management of acute on chronic systolic heart failure, as above, including IV diuresis efforts.  Continue home Eliquis.  Repeat EKG, as above.  Monitor on telemetry.     #) Hyperlipidemia: On lovastatin as an outpatient.  Plan: Continue home statin.      #) Acquired hypothyroidism: On  Synthroid as an outpatient.  In the setting of presenting acute on chronic systolic heart failure, will check TSH at this time.  Plan: Continue home dose of Synthroid for now.  Check TSH, as above.        #) Chronic interstitial lung disease: Documented history of such, although etiology of underlying interstitial lung disease is not entirely clear to me at this time.  Outpatient respiratory regimen consists of tiotropium/olodaterol.  Presentation appears more consistent, clinically with acute on chronic systolic heart failure as opposed to representing acute exacerbation of chronic  interstitial lung disease.  Patient conveys good compliance with her home respiratory regimen.   Plan: Monitor continuous pulse oximetry.  Continue home respiratory regimen.  As needed albuterol nebulizer ordered.  Monitor on telemetry.  Additional chart review for results of prior pulmonary function testing.         DVT prophylaxis: Continue home Eliquis Code Status: Full code Family Communication: none Disposition Plan: Per Rounding Team Consults called: none  Admission status: Inpatient; med telemetry     Of note, this patient was added by me to the following Admit List/Treatment Team: [email or web address].      PLEASE NOTE THAT DRAGON DICTATION SOFTWARE WAS USED IN THE CONSTRUCTION OF THIS NOTE.   [place] [facility] Pager [phone] From [place]  Otherwise, please contact night-coverage  [email or web address] Password [facility]   05/19/2021, 12:39 AM

## 2021-05-20 ENCOUNTER — Telehealth: Payer: Self-pay

## 2021-05-20 LAB — GLUCOSE, CAPILLARY
Glucose-Capillary: 317 mg/dL — ABNORMAL HIGH (ref 70–99)
Glucose-Capillary: 328 mg/dL — ABNORMAL HIGH (ref 70–99)
Glucose-Capillary: 183 mg/dL — ABNORMAL HIGH (ref 70–99)
Glucose-Capillary: 168 mg/dL — ABNORMAL HIGH (ref 70–99)

## 2021-05-20 LAB — BASIC METABOLIC PANEL
Anion gap: 10 (ref 5–15)
BUN: 23 mg/dL (ref 8–23)
CO2: 25 mmol/L (ref 22–32)
Calcium: 8.8 mg/dL — ABNORMAL LOW (ref 8.9–10.3)
Chloride: 103 mmol/L (ref 98–111)
Creatinine, Ser: 0.86 mg/dL (ref 0.44–1.00)
GFR, Estimated: >60 mL/min (ref >60)
Glucose, Bld: 333 mg/dL — ABNORMAL HIGH (ref 70–99)
Potassium: 4.2 mmol/L (ref 3.5–5.1)
Sodium: 138 mmol/L (ref 135–145)

## 2021-05-20 LAB — MAGNESIUM: Magnesium: 2.2 mg/dL (ref 1.7–2.4)

## 2021-05-20 MED ORDER — SENNOSIDES-DOCUSATE SODIUM 8.6-50 MG PO TABS
1.0000 | ORAL_TABLET | Freq: Every evening | ORAL | Status: DC | PRN
  Administered 2021-05-20: 1 via ORAL
  Filled 2021-05-20: qty 1

## 2021-05-20 MED ORDER — INSULIN ASPART 100 UNIT/ML IJ SOLN
3.0000 [IU] | Freq: Three times a day (TID) | INTRAMUSCULAR | Status: DC
  Administered 2021-05-21 (×2): 3 [IU] via SUBCUTANEOUS

## 2021-05-20 MED ORDER — INSULIN GLARGINE 100 UNIT/ML ~~LOC~~ SOLN
8.0000 [IU] | Freq: Two times a day (BID) | SUBCUTANEOUS | Status: DC
  Administered 2021-05-21: 8 [IU] via SUBCUTANEOUS
  Filled 2021-05-20: qty 0.08

## 2021-05-20 NOTE — Progress Notes (Addendum)
Progress Note  Patient Name: Elizabeth Melton Date of Encounter: 05/20/2021  [facility] Cardiologist: [name], MD   Subjective   No chest pain, feels much better, would like to be up and walking, she lives on second floor and must do steps.  Breathing improved.  Inpatient Medications    Scheduled Meds:  apixaban  5 mg Oral BID   arformoterol  15 mcg Nebulization BID   budesonide (PULMICORT) nebulizer solution  0.5 mg Nebulization BID   furosemide  40 mg Intravenous BID   insulin aspart  0-9 Units Subcutaneous TID WC   insulin glargine  8 Units Subcutaneous QHS   levothyroxine  75 mcg Oral Q0600   methylPREDNISolone (SOLU-MEDROL) injection  60 mg Intravenous Q24H   Followed by   Derrill Memo ON 05/21/2021] predniSONE  40 mg Oral Q breakfast   Followed by   Derrill Memo ON 05/24/2021] predniSONE  30 mg Oral Q breakfast   Followed by   Derrill Memo ON 05/27/2021] predniSONE  20 mg Oral Q breakfast   Followed by   Derrill Memo ON 05/30/2021] predniSONE  10 mg Oral Q breakfast   metoprolol succinate  12.5 mg Oral Daily   nystatin  5 mL Oral QID   potassium chloride  10 mEq Oral Daily   pravastatin  40 mg Oral q1800   umeclidinium bromide  1 puff Inhalation Daily   Continuous Infusions:  PRN Meds: acetaminophen **OR** acetaminophen, albuterol   Vital Signs    Vitals:   05/19/21 2043 05/19/21 2131 05/20/21 0100 05/20/21 0452  BP: (!) 134/57   (!) 145/60  Pulse: 64   61  Resp: 20   16  Temp: 97.7 F (36.5 C)   98 F (36.7 C)  TempSrc:    Oral  SpO2: 94%   91%  Weight:   76.2 kg   Height:  _0  (1.676 m)      Intake/Output Summary (Last 24 hours) at 05/20/2021 0814 Last data filed at 05/20/2021 0124 Gross per 24 hour  Intake --  Output 2450 ml  Net -2450 ml   Last 3 Weights 05/20/2021 05/19/2021 04/29/2021  Weight (lbs) 167 lb 15.9 oz 170 lb 13.7 oz 174 lb  Weight (kg) 76.2 kg 77.5 kg 78.926 kg      Telemetry    SR with PACs - Personally Reviewed  ECG    No  new - Personally Reviewed  Physical Exam   GEN: No acute distress.   Neck: + JVD at 30 degrees Cardiac: RRR, 2/6 systolic murmur, no rubs, or gallops.  Respiratory: rhonchi throughout to auscultation bilaterally crackles in bases. GI: Soft, nontender, non-distended  MS: No edema resolved; No deformity. Neuro:  Nonfocal  Psych: Normal affect   Labs    High Sensitivity Troponin:  No results for input(s): TROPONINIHS in the last 720 hours.    Chemistry Recent Labs  Lab 05/18/21 1958 05/19/21 0554 05/20/21 0455  NA 140 138 138  K 3.5 5.2* 4.2  CL 108 106 103  CO2 _1 GLUCOSE 138* 385* 333*  BUN _2 CREATININE 0.85 0.98 0.86  CALCIUM 8.5* 8.3* 8.8*  PROT 7.1 6.9  --   ALBUMIN 3.2* 3.0*  --   AST 24 33  --   ALT 20 17  --   ALKPHOS 55 50  --   BILITOT 1.0 1.1  --   GFRNONAA >60 59* >60  ANIONGAP _3 Hematology Recent  Labs  Lab 05/18/21 1958 05/19/21 0554  WBC 6.9 5.4  RBC 4.13 4.25  HGB 14.0 14.3  HCT 44.7 45.6  MCV 108.2* 107.3*  MCH 33.9 33.6  MCHC 31.3 31.4  RDW 15.9* 15.9*  PLT 150 152    BNP Recent Labs  Lab 05/18/21 1958  BNP 509.8*     DDimer No results for input(s): DDIMER in the last 168 hours.   Radiology    DG Chest Port 1 View  Result Date: 05/18/2021 CLINICAL DATA:  Shortness of breath. EXAM: PORTABLE CHEST 1 VIEW COMPARISON:  May 16, 2021. FINDINGS: Stable cardiomediastinal silhouette. No pneumothorax is noted. Slightly increased bilateral reticular densities are noted throughout both lungs, most consistent with chronic interstitial lung disease with superimposed pulmonary edema or atypical inflammation. Bony thorax is unremarkable. IMPRESSION: Slightly increased bilateral lung opacities are noted most consistent with chronic interstitial lung disease with superimposed pulmonary edema or atypical inflammation. Aortic Atherosclerosis (ICD10-I70.0). Electronically Signed   By: Marijo Conception M.D.   On: 05/18/2021 19:59    ECHOCARDIOGRAM COMPLETE  Result Date: 05/19/2021    ECHOCARDIOGRAM REPORT   Patient Name:   Elizabeth Melton Date of Exam: 05/19/2021 Medical Rec #:  [id number]          Height:       66.0 in Accession #:    [id number]         Weight:       174.0 lb Date of Birth:  [birth year]          BSA:          1.885 m Patient Age:    80 years           BP:           148/57 mmHg Patient Gender: F                  HR:           67 bpm. Exam Location:  Inpatient Procedure: 2D Echo, Cardiac Doppler and Color Doppler Indications:    CHF  History:        Patient has prior history of Echocardiogram examinations, most                 recent 01/22/2021. CHF, Arrythmias:Atrial Fibrillation,                 Signs/Symptoms:Shortness of Breath; Risk Factors:Diabetes,                 Dyslipidemia and Hypertension. Lung disease, resp. faliure.  Sonographer:    Dustin Flock Referring Phys: [id number] [place]  1. Left ventricular ejection fraction, by estimation, is 60 to 65%. The left ventricle has normal function. The left ventricle has no regional wall motion abnormalities. Left ventricular diastolic parameters are consistent with Grade II diastolic dysfunction (pseudonormalization).  2. Right ventricular systolic function is normal. The right ventricular size is mildly enlarged. There is severely elevated pulmonary artery systolic pressure. The estimated right ventricular systolic pressure is 73.7 mmHg.  3. Left atrial size was mildly dilated.  4. Right atrial size was mildly dilated.  5. The mitral valve is degenerative. Mild mitral valve regurgitation. No evidence of mitral stenosis. Moderate mitral annular calcification.  6. The tricuspid valve is abnormal. Tricuspid valve regurgitation is moderate.  7. The aortic valve is tricuspid. Aortic valve regurgitation is mild. Mild to moderate aortic valve sclerosis/calcification is present, without any evidence of aortic stenosis.  8.  The inferior vena cava is dilated  in size with <50% respiratory variability, suggesting right atrial pressure of 15 mmHg. FINDINGS  Left Ventricle: Left ventricular ejection fraction, by estimation, is 60 to 65%. The left ventricle has normal function. The left ventricle has no regional wall motion abnormalities. The left ventricular internal cavity size was normal in size. There is  no left ventricular hypertrophy. Left ventricular diastolic parameters are consistent with Grade II diastolic dysfunction (pseudonormalization). Right Ventricle: The right ventricular size is mildly enlarged. No increase in right ventricular wall thickness. Right ventricular systolic function is normal. There is severely elevated pulmonary artery systolic pressure. The tricuspid regurgitant velocity is 4.09 m/s, and with an assumed right atrial pressure of 15 mmHg, the estimated right ventricular systolic pressure is 00.3 mmHg. Left Atrium: Left atrial size was mildly dilated. Right Atrium: Right atrial size was mildly dilated. Pericardium: There is no evidence of pericardial effusion. Mitral Valve: The mitral valve is degenerative in appearance. There is mild calcification of the mitral valve leaflet(s). Moderate mitral annular calcification. Mild mitral valve regurgitation. No evidence of mitral valve stenosis. Tricuspid Valve: The tricuspid valve is abnormal. Tricuspid valve regurgitation is moderate. Aortic Valve: The aortic valve is tricuspid. Aortic valve regurgitation is mild. Aortic regurgitation PHT measures 425 msec. Mild to moderate aortic valve sclerosis/calcification is present, without any evidence of aortic stenosis. Aortic valve mean gradient measures 8.0 mmHg. Aortic valve peak gradient measures 18.0 mmHg. Aortic valve area, by VTI measures 1.84 cm. Pulmonic Valve: The pulmonic valve was normal in structure. Pulmonic valve regurgitation is not visualized. Aorta: The aortic root is normal in size and structure. Venous: The inferior vena cava is dilated  in size with less than 50% respiratory variability, suggesting right atrial pressure of 15 mmHg. IAS/Shunts: No atrial level shunt detected by color flow Doppler.  LEFT VENTRICLE PLAX 2D LVIDd:         4.60 cm  Diastology LVIDs:         3.00 cm  LV e' medial:    6.42 cm/s LV PW:         1.10 cm  LV E/e' medial:  18.5 LV IVS:        1.10 cm  LV e' lateral:   7.29 cm/s LVOT diam:     2.00 cm  LV E/e' lateral: 16.3 LV SV:         77 LV SV Index:   41 LVOT Area:     3.14 cm  RIGHT VENTRICLE RV Basal diam:  4.10 cm RV S prime:     9.03 cm/s TAPSE (M-mode): 1.9 cm LEFT ATRIUM           Index       RIGHT ATRIUM           Index LA diam:      4.20 cm 2.23 cm/m  RA Area:     18.00 cm LA Vol (A2C): 56.0 ml 29.71 ml/m RA Volume:   52.80 ml  28.01 ml/m LA Vol (A4C): 57.1 ml 30.29 ml/m  AORTIC VALVE AV Area (Vmax):    1.60 cm AV Area (Vmean):   1.49 cm AV Area (VTI):     1.84 cm AV Vmax:           212.00 cm/s AV Vmean:          134.000 cm/s AV VTI:            0.418 m AV Peak Grad:  18.0 mmHg AV Mean Grad:      8.0 mmHg LVOT Vmax:         108.00 cm/s LVOT Vmean:        63.500 cm/s LVOT VTI:          0.245 m LVOT/AV VTI ratio: 0.59 AI PHT:            425 msec  AORTA Ao Root diam: 3.00 cm MITRAL VALVE                TRICUSPID VALVE MV Area (PHT): 2.50 cm     TR Peak grad:   66.9 mmHg MV Decel Time: 304 msec     TR Vmax:        409.00 cm/s MV E velocity: 119.00 cm/s MV A velocity: 97.40 cm/s   SHUNTS MV E/A ratio:  1.22         Systemic VTI:  0.24 m                             Systemic Diam: 2.00 cm Loralie Champagne MD Electronically signed by Loralie Champagne MD Signature Date/Time: 05/19/2021/3:58:31 PM    Final     Cardiac Studies   Echo 05/19/21 IMPRESSIONS     1. Left ventricular ejection fraction, by estimation, is 60 to 65%. The  left ventricle has normal function. The left ventricle has no regional  wall motion abnormalities. Left ventricular diastolic parameters are  consistent with Grade II diastolic   dysfunction (pseudonormalization).   2. Right ventricular systolic function is normal. The right ventricular  size is mildly enlarged. There is severely elevated pulmonary artery  systolic pressure. The estimated right ventricular systolic pressure is  22.2 mmHg.   3. Left atrial size was mildly dilated.   4. Right atrial size was mildly dilated.   5. The mitral valve is degenerative. Mild mitral valve regurgitation. No  evidence of mitral stenosis. Moderate mitral annular calcification.   6. The tricuspid valve is abnormal. Tricuspid valve regurgitation is  moderate.   7. The aortic valve is tricuspid. Aortic valve regurgitation is mild.  Mild to moderate aortic valve sclerosis/calcification is present, without  any evidence of aortic stenosis.   8. The inferior vena cava is dilated in size with <50% respiratory  variability, suggesting right atrial pressure of 15 mmHg.   FINDINGS   Left Ventricle: Left ventricular ejection fraction, by estimation, is 60  to 65%. The left ventricle has normal function. The left ventricle has no  regional wall motion abnormalities. The left ventricular internal cavity  size was normal in size. There is   no left ventricular hypertrophy. Left ventricular diastolic parameters  are consistent with Grade II diastolic dysfunction (pseudonormalization).   Right Ventricle: The right ventricular size is mildly enlarged. No  increase in right ventricular wall thickness. Right ventricular systolic  function is normal. There is severely elevated pulmonary artery systolic  pressure. The tricuspid regurgitant  velocity is 4.09 m/s, and with an assumed right atrial pressure of 15  mmHg, the estimated right ventricular systolic pressure is 97.9 mmHg.   Left Atrium: Left atrial size was mildly dilated.   Right Atrium: Right atrial size was mildly dilated.   Pericardium: There is no evidence of pericardial effusion.   Mitral Valve: The mitral valve is  degenerative in appearance. There is  mild calcification of the mitral valve leaflet(s). Moderate mitral annular  calcification. Mild mitral valve regurgitation. No  evidence of mitral  valve stenosis.   Tricuspid Valve: The tricuspid valve is abnormal. Tricuspid valve  regurgitation is moderate.   Aortic Valve: The aortic valve is tricuspid. Aortic valve regurgitation is  mild. Aortic regurgitation PHT measures 425 msec. Mild to moderate aortic  valve sclerosis/calcification is present, without any evidence of aortic  stenosis. Aortic valve mean  gradient measures 8.0 mmHg. Aortic valve peak gradient measures 18.0 mmHg.  Aortic valve area, by VTI measures 1.84 cm.   Pulmonic Valve: The pulmonic valve was normal in structure. Pulmonic valve  regurgitation is not visualized.   Aorta: The aortic root is normal in size and structure.   Venous: The inferior vena cava is dilated in size with less than 50%  respiratory variability, suggesting right atrial pressure of 15 mmHg.   IAS/Shunts: No atrial level shunt detected by color flow Doppler.   Patient Profile     80 y.o. female  with a hx of submassive PE, interstitial lung disease, diabetes, hyperlipidemia, HFpEF who was admitted on 01/16/2021 with COVID-19 pneumonia, atrial fim on BB and eliquis, mod MR, EF 40-45% mild to mod TR moderate AI, mildly reduced RV function now admitted with ILD and acute CHF.   Assessment & Plan   Acute on chronic combined systolic and diastolic HF in combination with interstitial lung disease - she is neg 2450 ml currently after Lasix 40 in ER IV and now on Lasix 40 mg IV BID.  wt down 1.3 Kg  Cr is stable.  On admit with elevated BNP.  Hx of EF 40-45% with mild RV failure while in a fib in Feb 2022.  now with normal EF to 60-65% G2DD, though PA systolic pressure is 12.4   will defer to Dr. Johnsie Cancel on decreasing lasix  Interstitial lung disease and chronic hypoxia, pt not always compliant with 02 and  decompensates.  IM is addressing with solumedrol and currently on higher 02 requirement than at home. CCM has seen as well and pulmonary meds adjusted. They feel if no improvement Rt heart cath may be warranted .   She does feel better PAF in hospital with COVID 19 PNA 01/2021 - she converted to SR on amiodarone but with lung issues amio was stopped. On eliquis and torpol XL 12.5 daily  did have Bradycardia with amio and BB.    Moderate AR and Moderate MR. These have improved with maintaining SR.  Mod. TR      Hx of coronary calcifications on low dose CT chest in 2019 on LM, LAD and RCA. Seen again in 2022. Hx tobacco use but has stopped smoking since Feb.            For questions or updates, please contact [facility] Please consult [email or web address] for contact info under        Signed, Cecilie Kicks, NP  05/20/2021, 8:14 AM    Patient examined chart reviewed lungs still with diffuse rhonchi good diuresis  Continue lasix She is constipated needs laxative. Primary issue is multiple lung insults with COPD/ILD/PE and Covid. May benefit from pulmonary rehab  Jenkins Rouge MD [facility]

## 2021-05-20 NOTE — Progress Notes (Signed)
PROGRESS NOTE    Elizabeth Melton  OVZ:[id number] DOB: May 21, 1941 DOA: 05/18/2021 PCP: Kathyrn Lass, MD    Chief Complaint  Patient presents with   Shortness of Breath    Brief Narrative:  Elizabeth Melton is a 80 y.o. female with medical history significant for chronic hypoxic respiratory failure on continuous 2 L nasal cannula, chronic systolic heart failure with most recent echocardiogram in February 2022 showing LVEF 40 to 45%, paroxysmal atrial fibrillation chronically anticoagulated on Eliquis, acute pulmonary embolism in 2021 prior to initiation of Eliquis, hyperlipidemia, chronic interstitial lung disease, who is admitted to [facility] on 05/18/2021 with acute on chronic hypoxic respiratory failure  Subjective:   Breathing is better, edema has improved, she is now on 3liter o2 , no dyspnea at rest  Reports Being constipated   Daughter at bedside   Assessment & Plan:   Principal Problem:   Acute on chronic respiratory failure with hypoxia ([place]) Active Problems:   Hypothyroidism   Hyperlipidemia   Diabetes mellitus without complication ([place])   Tobacco abuse   Acute on chronic systolic (congestive) heart failure (HCC)   SOB (shortness of breath)   AF (paroxysmal atrial fibrillation) (HCC)   Acute on chronic congestive heart failure (HCC)   Acute on chronic hypoxic respiratory failure -Likely from CHF exacerbation and progression of pulmonary fibrosis -Cardiology and pulmonology consulted  Acute on chronic combined CHF Diuresing well, edema has improved, will follow cardiology recommendation  PAF On Toprol XL and Eliquis  Interstitial lung disease Pulmonology input appreciated, she is started on steroid, might need right heart cath  Will follow pulmonology recommendation  Noninsulin-dependent type 2 diabetes, hyperglycemia -Appear to be diet controlled, I do not see any diabetic medication on home med list -Hyperglycemia likely steroid induced,  start Lantus and SSI and meal coverage, continue to adjust  Microcytosis MCV 107 Check B12/folate TSH unremarkable Follow-up with PCP  Body mass index is 27.11 kg/m.Marland Kitchen     Unresulted Labs (From admission, onward)     Start     Ordered   05/20/21 4128  Basic metabolic panel  Daily,   R      05/19/21 1657   05/20/21 0500  Hemoglobin A1c  Tomorrow morning,   R        05/19/21 1658              DVT prophylaxis: SCDs Start: 05/19/21 0035 SCDs Start: 05/19/21 0031 apixaban (ELIQUIS) tablet 5 mg   Code Status:full  Family Communication: daughter at bedside on 6/10 Disposition:   Status is: Inpatient   Dispo: The patient is from: home               Anticipated d/c is to: home , may need home health              Anticipated d/c date is: TBD                Consultants:  Cardiology  Pulmonary   Procedures:  None  Antimicrobials:    Anti-infectives (From admission, onward)    None          Objective: Vitals:   05/20/21 1407 05/20/21 1930 05/20/21 1932 05/20/21 2139  BP: 132/73   (!) 147/60  Pulse: 78   72  Resp: 16   18  Temp: 97.9 F (36.6 C)   98 F (36.7 C)  TempSrc: Oral   Oral  SpO2: 91% 95% 95% 95%  Weight:  Height:        Intake/Output Summary (Last 24 hours) at 05/20/2021 2220 Last data filed at 05/20/2021 1604 Gross per 24 hour  Intake --  Output 2750 ml  Net -2750 ml   Filed Weights   05/19/21 1615 05/20/21 0100  Weight: 77.5 kg 76.2 kg    Examination:  General exam: calm, NAD Respiratory system: Clear to auscultation. Respiratory effort normal. Cardiovascular system: S1 & S2 heard, RRR.  Gastrointestinal system: Abdomen is nondistended, soft and nontender.  Normal bowel sounds heard. Central nervous system: Alert and oriented. No focal neurological deficits. Extremities: Bilateral lower extremity edema has improved Skin: Chronic bilateral  lower extremity venous stasis skin changes Psychiatry: Judgement and insight  appear normal. Mood & affect appropriate.     Data Reviewed: I have personally reviewed following labs and imaging studies  CBC: Recent Labs  Lab 05/18/21 1958 05/19/21 0554  WBC 6.9 5.4  NEUTROABS 4.5 4.8  HGB 14.0 14.3  HCT 44.7 45.6  MCV 108.2* 107.3*  PLT 150 017    Basic Metabolic Panel: Recent Labs  Lab 05/18/21 1958 05/19/21 0554 05/20/21 0455  NA 140 138 138  K 3.5 5.2* 4.2  CL 108 106 103  CO2 _0 GLUCOSE 138* 385* 333*  BUN _1 CREATININE 0.85 0.98 0.86  CALCIUM 8.5* 8.3* 8.8*  MG  --  2.3 2.2  PHOS  --  4.1  --     GFR: Estimated Creatinine Clearance: 56.3 mL/min (by C-G formula based on SCr of 0.86 mg/dL).  Liver Function Tests: Recent Labs  Lab 05/18/21 1958 05/19/21 0554  AST 24 33  ALT 20 17  ALKPHOS 55 50  BILITOT 1.0 1.1  PROT 7.1 6.9  ALBUMIN 3.2* 3.0*    CBG: Recent Labs  Lab 05/19/21 1622 05/19/21 2055 05/20/21 0747 05/20/21 1141 05/20/21 1702  GLUCAP 167* 163* 317* 328* 183*     Recent Results (from the past 240 hour(s))  Resp Panel by RT-PCR (Flu A&B, Covid) Nasopharyngeal Swab     Status: None   Collection Time: 05/18/21  8:09 PM   Specimen: Nasopharyngeal Swab; Nasopharyngeal(NP) swabs in vial transport medium  Result Value Ref Range Status   SARS Coronavirus 2 by RT PCR NEGATIVE NEGATIVE Final    Comment: (NOTE) SARS-CoV-2 target nucleic acids are NOT DETECTED.  The SARS-CoV-2 RNA is generally detectable in upper respiratory specimens during the acute phase of infection. The lowest concentration of SARS-CoV-2 viral copies this assay can detect is 138 copies/mL. A negative result does not preclude SARS-Cov-2 infection and should not be used as the sole basis for treatment or other patient management decisions. A negative result may occur with  improper specimen collection/handling, submission of specimen other than nasopharyngeal swab, presence of viral mutation(s) within the areas targeted by this  assay, and inadequate number of viral copies(<138 copies/mL). A negative result must be combined with clinical observations, patient history, and epidemiological information. The expected result is Negative.  Fact Sheet for Patients:  EntrepreneurPulse.com.au  Fact Sheet for Healthcare Providers:  IncredibleEmployment.be  This test is no t yet approved or cleared by the [place] FDA and  has been authorized for detection and/or diagnosis of SARS-CoV-2 by FDA under an Emergency Use Authorization (EUA). This EUA will remain  in effect (meaning this test can be used) for the duration of the COVID-19 declaration under Section 564(b)(1) of the Act, 21 U.S.C.section 360bbb-3(b)(1), unless the authorization is terminated  or revoked  sooner.       Influenza A by PCR NEGATIVE NEGATIVE Final   Influenza B by PCR NEGATIVE NEGATIVE Final    Comment: (NOTE) The Xpert Xpress SARS-CoV-2/FLU/RSV plus assay is intended as an aid in the diagnosis of influenza from Nasopharyngeal swab specimens and should not be used as a sole basis for treatment. Nasal washings and aspirates are unacceptable for Xpert Xpress SARS-CoV-2/FLU/RSV testing.  Fact Sheet for Patients: EntrepreneurPulse.com.au  Fact Sheet for Healthcare Providers: IncredibleEmployment.be  This test is not yet approved or cleared by the [place] FDA and has been authorized for detection and/or diagnosis of SARS-CoV-2 by FDA under an Emergency Use Authorization (EUA). This EUA will remain in effect (meaning this test can be used) for the duration of the COVID-19 declaration under Section 564(b)(1) of the Act, 21 U.S.C. section 360bbb-3(b)(1), unless the authorization is terminated or revoked.  Performed at [facility], [place] [address], [place], [place] [address]          Radiology Studies: ECHOCARDIOGRAM COMPLETE  Result  Date: 05/19/2021    ECHOCARDIOGRAM REPORT   Patient Name:   Elizabeth Melton Date of Exam: 05/19/2021 Medical Rec #:  [id number]          Height:       66.0 in Accession #:    [id number]         Weight:       174.0 lb Date of Birth:  June 25, 1941          BSA:          1.885 m Patient Age:    55 years           BP:           148/57 mmHg Patient Gender: F                  HR:           67 bpm. Exam Location:  Inpatient Procedure: 2D Echo, Cardiac Doppler and Color Doppler Indications:    CHF  History:        Patient has prior history of Echocardiogram examinations, most                 recent 01/22/2021. CHF, Arrythmias:Atrial Fibrillation,                 Signs/Symptoms:Shortness of Breath; Risk Factors:Diabetes,                 Dyslipidemia and Hypertension. Lung disease, resp. faliure.  Sonographer:    Dustin Flock Referring Phys: [id number] [place]  1. Left ventricular ejection fraction, by estimation, is 60 to 65%. The left ventricle has normal function. The left ventricle has no regional wall motion abnormalities. Left ventricular diastolic parameters are consistent with Grade II diastolic dysfunction (pseudonormalization).  2. Right ventricular systolic function is normal. The right ventricular size is mildly enlarged. There is severely elevated pulmonary artery systolic pressure. The estimated right ventricular systolic pressure is 10.2 mmHg.  3. Left atrial size was mildly dilated.  4. Right atrial size was mildly dilated.  5. The mitral valve is degenerative. Mild mitral valve regurgitation. No evidence of mitral stenosis. Moderate mitral annular calcification.  6. The tricuspid valve is abnormal. Tricuspid valve regurgitation is moderate.  7. The aortic valve is tricuspid. Aortic valve regurgitation is mild. Mild to moderate aortic valve sclerosis/calcification is present, without any evidence of aortic stenosis.  8. The inferior vena cava  is dilated in size with <50% respiratory  variability, suggesting right atrial pressure of 15 mmHg. FINDINGS  Left Ventricle: Left ventricular ejection fraction, by estimation, is 60 to 65%. The left ventricle has normal function. The left ventricle has no regional wall motion abnormalities. The left ventricular internal cavity size was normal in size. There is  no left ventricular hypertrophy. Left ventricular diastolic parameters are consistent with Grade II diastolic dysfunction (pseudonormalization). Right Ventricle: The right ventricular size is mildly enlarged. No increase in right ventricular wall thickness. Right ventricular systolic function is normal. There is severely elevated pulmonary artery systolic pressure. The tricuspid regurgitant velocity is 4.09 m/s, and with an assumed right atrial pressure of 15 mmHg, the estimated right ventricular systolic pressure is 50.9 mmHg. Left Atrium: Left atrial size was mildly dilated. Right Atrium: Right atrial size was mildly dilated. Pericardium: There is no evidence of pericardial effusion. Mitral Valve: The mitral valve is degenerative in appearance. There is mild calcification of the mitral valve leaflet(s). Moderate mitral annular calcification. Mild mitral valve regurgitation. No evidence of mitral valve stenosis. Tricuspid Valve: The tricuspid valve is abnormal. Tricuspid valve regurgitation is moderate. Aortic Valve: The aortic valve is tricuspid. Aortic valve regurgitation is mild. Aortic regurgitation PHT measures 425 msec. Mild to moderate aortic valve sclerosis/calcification is present, without any evidence of aortic stenosis. Aortic valve mean gradient measures 8.0 mmHg. Aortic valve peak gradient measures 18.0 mmHg. Aortic valve area, by VTI measures 1.84 cm. Pulmonic Valve: The pulmonic valve was normal in structure. Pulmonic valve regurgitation is not visualized. Aorta: The aortic root is normal in size and structure. Venous: The inferior vena cava is dilated in size with less than 50%  respiratory variability, suggesting right atrial pressure of 15 mmHg. IAS/Shunts: No atrial level shunt detected by color flow Doppler.  LEFT VENTRICLE PLAX 2D LVIDd:         4.60 cm  Diastology LVIDs:         3.00 cm  LV e' medial:    6.42 cm/s LV PW:         1.10 cm  LV E/e' medial:  18.5 LV IVS:        1.10 cm  LV e' lateral:   7.29 cm/s LVOT diam:     2.00 cm  LV E/e' lateral: 16.3 LV SV:         77 LV SV Index:   41 LVOT Area:     3.14 cm  RIGHT VENTRICLE RV Basal diam:  4.10 cm RV S prime:     9.03 cm/s TAPSE (M-mode): 1.9 cm LEFT ATRIUM           Index       RIGHT ATRIUM           Index LA diam:      4.20 cm 2.23 cm/m  RA Area:     18.00 cm LA Vol (A2C): 56.0 ml 29.71 ml/m RA Volume:   52.80 ml  28.01 ml/m LA Vol (A4C): 57.1 ml 30.29 ml/m  AORTIC VALVE AV Area (Vmax):    1.60 cm AV Area (Vmean):   1.49 cm AV Area (VTI):     1.84 cm AV Vmax:           212.00 cm/s AV Vmean:          134.000 cm/s AV VTI:            0.418 m AV Peak Grad:      18.0 mmHg AV  Mean Grad:      8.0 mmHg LVOT Vmax:         108.00 cm/s LVOT Vmean:        63.500 cm/s LVOT VTI:          0.245 m LVOT/AV VTI ratio: 0.59 AI PHT:            425 msec  AORTA Ao Root diam: 3.00 cm MITRAL VALVE                TRICUSPID VALVE MV Area (PHT): 2.50 cm     TR Peak grad:   66.9 mmHg MV Decel Time: 304 msec     TR Vmax:        409.00 cm/s MV E velocity: 119.00 cm/s MV A velocity: 97.40 cm/s   SHUNTS MV E/A ratio:  1.22         Systemic VTI:  0.24 m                             Systemic Diam: 2.00 cm Loralie Champagne MD Electronically signed by Loralie Champagne MD Signature Date/Time: 05/19/2021/3:58:31 PM    Final         Scheduled Meds:  apixaban  5 mg Oral BID   arformoterol  15 mcg Nebulization BID   budesonide (PULMICORT) nebulizer solution  0.5 mg Nebulization BID   furosemide  40 mg Intravenous BID   insulin aspart  0-9 Units Subcutaneous TID WC   [START ON 05/21/2021] insulin aspart  3 Units Subcutaneous TID WC   [START ON 05/21/2021]  insulin glargine  8 Units Subcutaneous BID   levothyroxine  75 mcg Oral Q0600   metoprolol succinate  12.5 mg Oral Daily   nystatin  5 mL Oral QID   potassium chloride  10 mEq Oral Daily   pravastatin  40 mg Oral q1800   [START ON 05/21/2021] predniSONE  40 mg Oral Q breakfast   Followed by   Derrill Memo ON 05/24/2021] predniSONE  30 mg Oral Q breakfast   Followed by   Derrill Memo ON 05/27/2021] predniSONE  20 mg Oral Q breakfast   Followed by   Derrill Memo ON 05/30/2021] predniSONE  10 mg Oral Q breakfast   umeclidinium bromide  1 puff Inhalation Daily   Continuous Infusions:   LOS: 1 day   Time spent: 76mns Greater than 50% of this time was spent in counseling, explanation of diagnosis, planning of further management, and coordination of care.   Voice Recognition /Viviann Sparedictation system was used to create this note, attempts have been made to correct errors. Please contact the author with questions and/or clarifications.   FFlorencia Reasons MD PhD FACP [facility]  Available via Epic secure chat 7am-7pm for nonurgent issues Please page for urgent issues To page the attending provider between 7A-7P or the covering provider during after hours 7P-7A, please log into the web site [email or web address] and access using universal [place] password for that web site. If you do not have the password, please call the hospital operator.    05/20/2021, 10:20 PM

## 2021-05-20 NOTE — Evaluation (Signed)
Physical Therapy Evaluation Patient Details Name: Elizabeth Melton MRN: [id number] DOB: [birth year] Today's Date: 05/20/2021   History of Present Illness  80 yo female with worsening  hypoxemia, admittediwth  acute on chronic systolic heart failure.  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  Pt amb ~ 120' with RW and min assist, one standing rest, 3/4 DOE (uses rollator at baseline).   on 3L O2 throughout distance, SpO2=86% after amb, incr to 89% with seated rest, pt reports running in mid 70s with activity/exertion at home. Will likely benefit from HHPT at d/c  Will need to mobilize as much as possible to incr endurance since pt has 15 steps to enter her home   Pt currently with functional limitations due to the deficits listed below (see PT Problem List). Pt will benefit from skilled PT to increase their independence and safety with mobility to allow discharge to the venue listed below.       Follow Up Recommendations Home health PT    Equipment Recommendations  None recommended by PT    Recommendations for Other Services       Precautions / Restrictions Precautions Precautions: Fall Precaution Comments: monitor O2 Restrictions Weight Bearing Restrictions: No      Mobility  Bed Mobility Overal bed mobility: Needs Assistance Bed Mobility: Supine to Sit     Supine to sit: Supervision;HOB elevated     General bed mobility comments: for safety    Transfers Overall transfer level: Needs assistance Equipment used: Rolling walker (2 wheeled) Transfers: Sit to/from Stand Sit to Stand: Min guard         General transfer comment: for safety  Ambulation/Gait Ambulation/Gait assistance: Min assist;Min guard Gait Distance (Feet): 120 Feet Assistive device: Rolling walker (2 wheeled) Gait  Pattern/deviations: Step-through pattern;Decreased stride length     General Gait Details: cues for RW safety, maintain feet inside RW, unsteady however without overt LOB. on 3L O2 throughout distance, SpO2=86% after amb, incr to 89% with seated rest.  Stairs            Wheelchair Mobility    Modified Rankin (Stroke Patients Only)       Balance Overall balance assessment: Needs assistance Sitting-balance support: No upper extremity supported;Feet supported Sitting balance-Leahy Scale: Good Sitting balance - Comments: pt able to don her own depends in sitting   Standing balance support: During functional activity Standing balance-Leahy Scale: Fair Standing balance comment: able to stand and complete donning of depends with close supervision                             Pertinent Vitals/Pain Pain Assessment: No/denies pain    Home Living Family/patient expects to be discharged to:: Private residence Living Arrangements: Children (dtr) Available Help at Discharge: Family;Available PRN/intermittently   Home Access: Stairs to enter   Entrance Stairs-Number of Steps: 15   Home Equipment: Cane - single point;Walker - 4 wheels Additional Comments: pt dtr assists with most of the cooking/meals    Prior Function Level of Independence: Independent with assistive device(s)         Comments: amb with rollator     Hand Dominance        Extremity/Trunk Assessment   Upper Extremity Assessment Upper Extremity Assessment: Overall WFL for tasks assessed    Lower Extremity Assessment Lower  Extremity Assessment: Generalized weakness       Communication   Communication: No difficulties  Cognition Arousal/Alertness: Awake/alert Behavior During Therapy: WFL for tasks assessed/performed Overall Cognitive Status: Within Functional Limits for tasks assessed                                 General Comments: pt is pleasant and motivated; Has  been on 3L continuous O2 since having covid. pt reports that her O2 sats at home run around mid to high 70s with activity and incr "slowly" to low/mid 90s with rest      General Comments      Exercises     Assessment/Plan    PT Assessment Patient needs continued PT services  PT Problem List Decreased mobility;Cardiopulmonary status limiting activity;Decreased activity tolerance;Decreased balance       PT Treatment Interventions DME instruction;Therapeutic activities;Gait training;Functional mobility training;Therapeutic exercise;Patient/family education;Balance training    PT Goals (Current goals can be found in the Care Plan section)  Acute Rehab PT Goals Patient Stated Goal: get stronger, walk PT Goal Formulation: With patient Time For Goal Achievement: 06/03/21 Potential to Achieve Goals: Good    Frequency Min 3X/week   Barriers to discharge        Co-evaluation               AM-PAC PT "6 Clicks" Mobility  Outcome Measure Help needed turning from your back to your side while in a flat bed without using bedrails?: A Little Help needed moving from lying on your back to sitting on the side of a flat bed without using bedrails?: A Little Help needed moving to and from a bed to a chair (including a wheelchair)?: A Little Help needed standing up from a chair using your arms (e.g., wheelchair or bedside chair)?: A Little Help needed to walk in hospital room?: A Little Help needed climbing 3-5 steps with a railing? : A Little 6 Click Score: 18    End of Session Equipment Utilized During Treatment: Gait belt Activity Tolerance: Patient tolerated treatment well Patient left: with chair alarm set   PT Visit Diagnosis: Other abnormalities of gait and mobility (R26.89)    Time: 7530-0511 PT Time Calculation (min) (ACUTE ONLY): 23 min   Charges:   PT Evaluation $PT Eval Low Complexity: 1 Low PT Treatments $Gait Training: 8-22 mins        Baxter Flattery, PT  Acute  Rehab Dept ([place]) [phone] Pager [phone]  05/20/2021   [facility] 05/20/2021, 3:50 PM

## 2021-05-20 NOTE — Telephone Encounter (Signed)
-----  Message from Swisher, MD sent at 05/20/2021 10:45 AM EDT ----- Please schedule patient for follow-up with Dr. [name] in 2-3 weeks for hospital follow-up
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
## 2021-05-20 NOTE — Telephone Encounter (Signed)
Patient is already scheduled with Dr. [name] on this date so we will keep it set for now.  Next Appt With Pulmonology [facility], MD)06/02/2021 at  9:00 AM

## 2021-05-20 NOTE — TOC Initial Note (Signed)
Transition of Care [facility]) - Initial/Assessment Note    Patient Details  Name: Elizabeth Melton MRN: [id number] Date of Birth: Oct 01, 1941  Transition of [facility]) CM/SW Contact:    Dessa Phi, RN Phone Number: 05/20/2021, 4:00 PM  Clinical Narrative:  From home w/home 02.                       Patient Goals and CMS Choice        Expected Discharge Plan and Services                                                Prior Living Arrangements/Services                       Activities of Daily Living Home Assistive Devices/Equipment: Cane (specify quad or straight), CBG Meter, Eyeglasses, Walker (specify type), Oxygen (front wheeled walker, single point cane) ADL Screening (condition at time of admission) Patient's cognitive ability adequate to safely complete daily activities?: Yes Is the patient deaf or have difficulty hearing?: No Does the patient have difficulty seeing, even when wearing glasses/contacts?: No Does the patient have difficulty concentrating, remembering, or making decisions?: No Patient able to express need for assistance with ADLs?: Yes Does the patient have difficulty dressing or bathing?: No Independently performs ADLs?: No Communication: Independent Dressing (OT): Independent Grooming: Independent Feeding: Independent Bathing: Independent Toileting: Independent with device (comment) In/Out Bed: Independent Walks in Home: Independent with device (comment) Does the patient have difficulty walking or climbing stairs?: Yes (secondary to shortness of breath) Weakness of Legs: Both Weakness of Arms/Hands: None  Permission Sought/Granted                  Emotional Assessment              Admission diagnosis:  Interstitial lung disease ([place]) [J84.9] Acute on chronic systolic (congestive) heart failure ([place]) [I50.23] Acute on chronic congestive heart failure, unspecified heart failure type ([place]) [I50.9] Patient  Active Problem List   Diagnosis Date Noted   Acute on chronic systolic (congestive) heart failure ([place]) 05/19/2021   Acute on chronic respiratory failure with hypoxia ([place]) 05/19/2021   SOB (shortness of breath) 05/19/2021   AF (paroxysmal atrial fibrillation) ([place]) 05/19/2021   Acute on chronic congestive heart failure ([place])    Atrial fibrillation with RVR ([place])    Demand ischemia ([place])    Pneumonia due to COVID-19 virus 01/16/2021   Acute respiratory failure with hypoxia ([place]) 09/06/2020   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 [place]Phone: [phone]: [phone]    Social Determinants of Health (SDOH) Interventions    Readmission Risk Interventions Readmission Risk  Prevention Plan 09/10/2020  Post Dischage Appt Complete  Medication Screening Complete  Transportation Screening Complete  Some recent data might be hidden

## 2021-05-20 NOTE — Progress Notes (Signed)
Inpatient Diabetes Program Recommendations  AACE/ADA: New Consensus Statement on Inpatient Glycemic Control (2015)  Target Ranges:  Prepandial:   less than 140 mg/dL      Peak postprandial:   less than 180 mg/dL (1-2 hours)      Critically ill patients:  140 - 180 mg/dL   Lab Results  Component Value Date   GLUCAP 317 (H) 05/20/2021   HGBA1C 7.6 (H) 01/16/2021    Review of Glycemic Control Results for Elizabeth Melton, Elizabeth Melton (MRN [id number]) as of 05/20/2021 10:57  Ref. Range 05/19/2021 08:04 05/19/2021 12:31 05/19/2021 16:22 05/19/2021 20:55 05/20/2021 07:47  Glucose-Capillary Latest Ref Range: 70 - 99 mg/dL 318 (H) Novolog 7 units  266 (H) Novolog 5 units 167 (H) Novolog 2 units 163 (H) 317 (H) Novolog 7 units   Diabetes history: DM2 Outpatient Diabetes medications: No meds listed Current orders for Inpatient glycemic control: Lantus 8 units + Novolog 0-9 units tid  Inpatient Diabetes Program Recommendations:   While on steroids, -Increase Novolog correction to moderate 0-15 units tid + hs 0-5 units Secure chat sent to Dr. Erlinda Hong.  Thank you, Nani Gasser. Shelise Maron, RN, MSN, CDE  Diabetes Coordinator Inpatient Glycemic Control Team Team Pager [phone] (8am-5pm) 05/20/2021 10:56 AM

## 2021-05-21 DIAGNOSIS — I5033 Acute on chronic diastolic (congestive) heart failure: Secondary | ICD-10-CM

## 2021-05-21 LAB — GLUCOSE, CAPILLARY
Glucose-Capillary: 276 mg/dL — ABNORMAL HIGH (ref 70–99)
Glucose-Capillary: 297 mg/dL — ABNORMAL HIGH (ref 70–99)
Glucose-Capillary: 366 mg/dL — ABNORMAL HIGH (ref 70–99)
Glucose-Capillary: 203 mg/dL — ABNORMAL HIGH (ref 70–99)

## 2021-05-21 LAB — BASIC METABOLIC PANEL
Anion gap: 10 (ref 5–15)
BUN: 30 mg/dL — ABNORMAL HIGH (ref 8–23)
CO2: 27 mmol/L (ref 22–32)
Calcium: 8.7 mg/dL — ABNORMAL LOW (ref 8.9–10.3)
Chloride: 101 mmol/L (ref 98–111)
Creatinine, Ser: 1.08 mg/dL — ABNORMAL HIGH (ref 0.44–1.00)
GFR, Estimated: 53 mL/min — ABNORMAL LOW (ref >60)
Glucose, Bld: 278 mg/dL — ABNORMAL HIGH (ref 70–99)
Potassium: 4.4 mmol/L (ref 3.5–5.1)
Sodium: 138 mmol/L (ref 135–145)

## 2021-05-21 LAB — HEMOGLOBIN A1C
Hgb A1c MFr Bld: 7 % — ABNORMAL HIGH (ref 4.8–5.6)
Mean Plasma Glucose: 154 mg/dL

## 2021-05-21 LAB — VITAMIN B12: Vitamin B-12: 1081 pg/mL — ABNORMAL HIGH (ref 180–914)

## 2021-05-21 LAB — FOLATE: Folate: 6.4 ng/mL

## 2021-05-21 MED ORDER — FUROSEMIDE 40 MG PO TABS
40.0000 mg | ORAL_TABLET | Freq: Every day | ORAL | Status: DC
  Administered 2021-05-21 – 2021-05-22 (×2): 40 mg via ORAL
  Filled 2021-05-21 (×2): qty 1

## 2021-05-21 MED ORDER — INSULIN GLARGINE 100 UNIT/ML ~~LOC~~ SOLN
10.0000 [IU] | Freq: Two times a day (BID) | SUBCUTANEOUS | Status: DC
  Administered 2021-05-21 – 2021-05-22 (×2): 10 [IU] via SUBCUTANEOUS
  Filled 2021-05-21 (×3): qty 0.1

## 2021-05-21 MED ORDER — METOPROLOL SUCCINATE ER 25 MG PO TB24
25.0000 mg | ORAL_TABLET | Freq: Every day | ORAL | Status: DC
  Administered 2021-05-22: 25 mg via ORAL
  Filled 2021-05-21: qty 1

## 2021-05-21 MED ORDER — INSULIN ASPART 100 UNIT/ML IJ SOLN
4.0000 [IU] | Freq: Three times a day (TID) | INTRAMUSCULAR | Status: DC
  Administered 2021-05-21 – 2021-05-22 (×2): 4 [IU] via SUBCUTANEOUS

## 2021-05-21 NOTE — Plan of Care (Signed)
  Problem: Elimination: Goal: Will not experience complications related to bowel motility Outcome: Progressing Goal: Will not experience complications related to urinary retention Outcome: Progressing   

## 2021-05-21 NOTE — Progress Notes (Signed)
Progress Note  Patient Name: Elizabeth Melton Date of Encounter: 05/21/2021  [facility] Cardiologist: [name], MD   Subjective   Breathing near baseline Able to have BM yesterday  Inpatient Medications    Scheduled Meds:  apixaban  5 mg Oral BID   arformoterol  15 mcg Nebulization BID   budesonide (PULMICORT) nebulizer solution  0.5 mg Nebulization BID   furosemide  40 mg Intravenous BID   insulin aspart  0-9 Units Subcutaneous TID WC   insulin aspart  3 Units Subcutaneous TID WC   insulin glargine  8 Units Subcutaneous BID   levothyroxine  75 mcg Oral Q0600   metoprolol succinate  12.5 mg Oral Daily   nystatin  5 mL Oral QID   potassium chloride  10 mEq Oral Daily   pravastatin  40 mg Oral q1800   predniSONE  40 mg Oral Q breakfast   Followed by   Derrill Memo ON 05/24/2021] predniSONE  30 mg Oral Q breakfast   Followed by   Derrill Memo ON 05/27/2021] predniSONE  20 mg Oral Q breakfast   Followed by   Derrill Memo ON 05/30/2021] predniSONE  10 mg Oral Q breakfast   umeclidinium bromide  1 puff Inhalation Daily   Continuous Infusions:  PRN Meds: acetaminophen **OR** acetaminophen, albuterol, senna-docusate   Vital Signs    Vitals:   05/20/21 1932 05/20/21 2139 05/21/21 0500 05/21/21 0605  BP:  (!) 147/60  (!) 151/46  Pulse:  72  62  Resp:  18  18  Temp:  98 F (36.7 C)  (!) 97.5 F (36.4 C)  TempSrc:  Oral  Oral  SpO2: 95% 95%  100%  Weight:   76.6 kg   Height:        Intake/Output Summary (Last 24 hours) at 05/21/2021 0746 Last data filed at 05/21/2021 0615 Gross per 24 hour  Intake --  Output 1650 ml  Net -1650 ml   Last 3 Weights 05/21/2021 05/20/2021 05/19/2021  Weight (lbs) 168 lb 14 oz 167 lb 15.9 oz 170 lb 13.7 oz  Weight (kg) 76.6 kg 76.2 kg 77.5 kg      Telemetry    SR with PACs - Personally Reviewed 05/21/2021   ECG    No new - Personally Reviewed  Physical Exam   GEN: No acute distress.   Neck: + JVD at 30 degrees Cardiac: RRR,  2/6 systolic murmur, no rubs, or gallops.  Respiratory: left lung improved less rhonchi right still with and exp wheezing  GI: Soft, nontender, non-distended  MS: No edema resolved; No deformity. Neuro:  Nonfocal  Psych: Normal affect   Labs    High Sensitivity Troponin:  No results for input(s): TROPONINIHS in the last 720 hours.    Chemistry Recent Labs  Lab 05/18/21 1958 05/19/21 0554 05/20/21 0455 05/21/21 0541  NA 140 138 138 138  K 3.5 5.2* 4.2 4.4  CL 108 106 103 101  CO2 _0 GLUCOSE 138* 385* 333* 278*  BUN _1 30*  CREATININE 0.85 0.98 0.86 1.08*  CALCIUM 8.5* 8.3* 8.8* 8.7*  PROT 7.1 6.9  --   --   ALBUMIN 3.2* 3.0*  --   --   AST 24 33  --   --   ALT 20 17  --   --   ALKPHOS 55 50  --   --   BILITOT 1.0 1.1  --   --   GFRNONAA >60 59* >60  53*  ANIONGAP _0 Hematology Recent Labs  Lab 05/18/21 1958 05/19/21 0554  WBC 6.9 5.4  RBC 4.13 4.25  HGB 14.0 14.3  HCT 44.7 45.6  MCV 108.2* 107.3*  MCH 33.9 33.6  MCHC 31.3 31.4  RDW 15.9* 15.9*  PLT 150 152    BNP Recent Labs  Lab 05/18/21 1958  BNP 509.8*     DDimer No results for input(s): DDIMER in the last 168 hours.   Radiology    ECHOCARDIOGRAM COMPLETE  Result Date: 05/19/2021    ECHOCARDIOGRAM REPORT   Patient Name:   ALYSSE RATHE Date of Exam: 05/19/2021 Medical Rec #:  [id number]          Height:       66.0 in Accession #:    [id number]         Weight:       174.0 lb Date of Birth:  1941-06-17          BSA:          1.885 m Patient Age:    80 years           BP:           148/57 mmHg Patient Gender: F                  HR:           67 bpm. Exam Location:  Inpatient Procedure: 2D Echo, Cardiac Doppler and Color Doppler Indications:    CHF  History:        Patient has prior history of Echocardiogram examinations, most                 recent 01/22/2021. CHF, Arrythmias:Atrial Fibrillation,                 Signs/Symptoms:Shortness of Breath; Risk Factors:Diabetes,                  Dyslipidemia and Hypertension. Lung disease, resp. faliure.  Sonographer:    Dustin Flock Referring Phys: [id number] [place]  1. Left ventricular ejection fraction, by estimation, is 60 to 65%. The left ventricle has normal function. The left ventricle has no regional wall motion abnormalities. Left ventricular diastolic parameters are consistent with Grade II diastolic dysfunction (pseudonormalization).  2. Right ventricular systolic function is normal. The right ventricular size is mildly enlarged. There is severely elevated pulmonary artery systolic pressure. The estimated right ventricular systolic pressure is 83.5 mmHg.  3. Left atrial size was mildly dilated.  4. Right atrial size was mildly dilated.  5. The mitral valve is degenerative. Mild mitral valve regurgitation. No evidence of mitral stenosis. Moderate mitral annular calcification.  6. The tricuspid valve is abnormal. Tricuspid valve regurgitation is moderate.  7. The aortic valve is tricuspid. Aortic valve regurgitation is mild. Mild to moderate aortic valve sclerosis/calcification is present, without any evidence of aortic stenosis.  8. The inferior vena cava is dilated in size with <50% respiratory variability, suggesting right atrial pressure of 15 mmHg. FINDINGS  Left Ventricle: Left ventricular ejection fraction, by estimation, is 60 to 65%. The left ventricle has normal function. The left ventricle has no regional wall motion abnormalities. The left ventricular internal cavity size was normal in size. There is  no left ventricular hypertrophy. Left ventricular diastolic parameters are consistent with Grade II diastolic dysfunction (pseudonormalization). Right Ventricle: The right ventricular size is mildly enlarged. No increase in right  ventricular wall thickness. Right ventricular systolic function is normal. There is severely elevated pulmonary artery systolic pressure. The tricuspid regurgitant velocity is 4.09  m/s, and with an assumed right atrial pressure of 15 mmHg, the estimated right ventricular systolic pressure is 57.0 mmHg. Left Atrium: Left atrial size was mildly dilated. Right Atrium: Right atrial size was mildly dilated. Pericardium: There is no evidence of pericardial effusion. Mitral Valve: The mitral valve is degenerative in appearance. There is mild calcification of the mitral valve leaflet(s). Moderate mitral annular calcification. Mild mitral valve regurgitation. No evidence of mitral valve stenosis. Tricuspid Valve: The tricuspid valve is abnormal. Tricuspid valve regurgitation is moderate. Aortic Valve: The aortic valve is tricuspid. Aortic valve regurgitation is mild. Aortic regurgitation PHT measures 425 msec. Mild to moderate aortic valve sclerosis/calcification is present, without any evidence of aortic stenosis. Aortic valve mean gradient measures 8.0 mmHg. Aortic valve peak gradient measures 18.0 mmHg. Aortic valve area, by VTI measures 1.84 cm. Pulmonic Valve: The pulmonic valve was normal in structure. Pulmonic valve regurgitation is not visualized. Aorta: The aortic root is normal in size and structure. Venous: The inferior vena cava is dilated in size with less than 50% respiratory variability, suggesting right atrial pressure of 15 mmHg. IAS/Shunts: No atrial level shunt detected by color flow Doppler.  LEFT VENTRICLE PLAX 2D LVIDd:         4.60 cm  Diastology LVIDs:         3.00 cm  LV e' medial:    6.42 cm/s LV PW:         1.10 cm  LV E/e' medial:  18.5 LV IVS:        1.10 cm  LV e' lateral:   7.29 cm/s LVOT diam:     2.00 cm  LV E/e' lateral: 16.3 LV SV:         77 LV SV Index:   41 LVOT Area:     3.14 cm  RIGHT VENTRICLE RV Basal diam:  4.10 cm RV S prime:     9.03 cm/s TAPSE (M-mode): 1.9 cm LEFT ATRIUM           Index       RIGHT ATRIUM           Index LA diam:      4.20 cm 2.23 cm/m  RA Area:     18.00 cm LA Vol (A2C): 56.0 ml 29.71 ml/m RA Volume:   52.80 ml  28.01 ml/m LA Vol  (A4C): 57.1 ml 30.29 ml/m  AORTIC VALVE AV Area (Vmax):    1.60 cm AV Area (Vmean):   1.49 cm AV Area (VTI):     1.84 cm AV Vmax:           212.00 cm/s AV Vmean:          134.000 cm/s AV VTI:            0.418 m AV Peak Grad:      18.0 mmHg AV Mean Grad:      8.0 mmHg LVOT Vmax:         108.00 cm/s LVOT Vmean:        63.500 cm/s LVOT VTI:          0.245 m LVOT/AV VTI ratio: 0.59 AI PHT:            425 msec  AORTA Ao Root diam: 3.00 cm MITRAL VALVE  TRICUSPID VALVE MV Area (PHT): 2.50 cm     TR Peak grad:   66.9 mmHg MV Decel Time: 304 msec     TR Vmax:        409.00 cm/s MV E velocity: 119.00 cm/s MV A velocity: 97.40 cm/s   SHUNTS MV E/A ratio:  1.22         Systemic VTI:  0.24 m                             Systemic Diam: 2.00 cm Loralie Champagne MD Electronically signed by Loralie Champagne MD Signature Date/Time: 05/19/2021/3:58:31 PM    Final     Cardiac Studies   Echo 05/19/21 IMPRESSIONS     1. Left ventricular ejection fraction, by estimation, is 60 to 65%. The  left ventricle has normal function. The left ventricle has no regional  wall motion abnormalities. Left ventricular diastolic parameters are  consistent with Grade II diastolic  dysfunction (pseudonormalization).   2. Right ventricular systolic function is normal. The right ventricular  size is mildly enlarged. There is severely elevated pulmonary artery  systolic pressure. The estimated right ventricular systolic pressure is  62.8 mmHg.   3. Left atrial size was mildly dilated.   4. Right atrial size was mildly dilated.   5. The mitral valve is degenerative. Mild mitral valve regurgitation. No  evidence of mitral stenosis. Moderate mitral annular calcification.   6. The tricuspid valve is abnormal. Tricuspid valve regurgitation is  moderate.   7. The aortic valve is tricuspid. Aortic valve regurgitation is mild.  Mild to moderate aortic valve sclerosis/calcification is present, without  any evidence of aortic stenosis.    8. The inferior vena cava is dilated in size with <50% respiratory  variability, suggesting right atrial pressure of 15 mmHg.   FINDINGS   Left Ventricle: Left ventricular ejection fraction, by estimation, is 60  to 65%. The left ventricle has normal function. The left ventricle has no  regional wall motion abnormalities. The left ventricular internal cavity  size was normal in size. There is   no left ventricular hypertrophy. Left ventricular diastolic parameters  are consistent with Grade II diastolic dysfunction (pseudonormalization).   Right Ventricle: The right ventricular size is mildly enlarged. No  increase in right ventricular wall thickness. Right ventricular systolic  function is normal. There is severely elevated pulmonary artery systolic  pressure. The tricuspid regurgitant  velocity is 4.09 m/s, and with an assumed right atrial pressure of 15  mmHg, the estimated right ventricular systolic pressure is 36.6 mmHg.   Left Atrium: Left atrial size was mildly dilated.   Right Atrium: Right atrial size was mildly dilated.   Pericardium: There is no evidence of pericardial effusion.   Mitral Valve: The mitral valve is degenerative in appearance. There is  mild calcification of the mitral valve leaflet(s). Moderate mitral annular  calcification. Mild mitral valve regurgitation. No evidence of mitral  valve stenosis.   Tricuspid Valve: The tricuspid valve is abnormal. Tricuspid valve  regurgitation is moderate.   Aortic Valve: The aortic valve is tricuspid. Aortic valve regurgitation is  mild. Aortic regurgitation PHT measures 425 msec. Mild to moderate aortic  valve sclerosis/calcification is present, without any evidence of aortic  stenosis. Aortic valve mean  gradient measures 8.0 mmHg. Aortic valve peak gradient measures 18.0 mmHg.  Aortic valve area, by VTI measures 1.84 cm.   Pulmonic Valve: The pulmonic valve was normal in  structure. Pulmonic valve   regurgitation is not visualized.   Aorta: The aortic root is normal in size and structure.   Venous: The inferior vena cava is dilated in size with less than 50%  respiratory variability, suggesting right atrial pressure of 15 mmHg.   IAS/Shunts: No atrial level shunt detected by color flow Doppler.   Patient Profile     80 y.o. female  with a hx of submassive PE, interstitial lung disease, diabetes, hyperlipidemia, HFpEF who was admitted on 01/16/2021 with COVID-19 pneumonia, atrial fim on BB and eliquis, mod MR, EF 40-45% mild to mod TR moderate AI, mildly reduced RV function now admitted with ILD and acute CHF.   Assessment & Plan   Acute on chronic combined systolic and diastolic HF in combination with interstitial lung disease - she is neg 3.5 L 's currently.with decreased weight  On admit with elevated BNP.  Hx of EF 40-45% with mild RV failure while in a fib in Feb 2022.  now with normal EF to 60-65% G2DD, though PA systolic pressure is 23.7    will change to lasix 40 mg po daily  Interstitial lung disease and chronic hypoxia, pt not always compliant with 02 and decompensates.  IM is addressing with solumedrol and currently on higher 02 requirement than at home. CCM has seen as well and pulmonary meds adjusted. They feel if no improvement Rt heart cath may be warranted .   She does feel better PAF in hospital with COVID 19 PNA 01/2021 - she converted to SR on amiodarone but with lung issues amio was stopped. On eliquis and torpol XL 12.5 daily  did have Bradycardia with amio and BB.    Moderate AR and Moderate MR. These have improved with maintaining SR.  Mod. TR      Hx of coronary calcifications on low dose CT chest in 2019 on LM, LAD and RCA. Seen again in 2022. Hx tobacco use but has stopped smoking since Feb.       Cardiology will sign off as most of her current issues Are related to her advance pulmonary Disease      For questions or updates, please contact [facility] Please  consult [email or web address] for contact info under        Signed, Jenkins Rouge, MD  05/21/2021, 7:46 AM    Patient examined chart reviewed lungs still with diffuse rhonchi good diuresis  Continue lasix She is constipated needs laxative. Primary issue is multiple lung insults with COPD/ILD/PE and Covid. May benefit from pulmonary rehab  Jenkins Rouge MD [facility] Patient ID: Lelon Perla, female   DOB: January 24, 1941, 80 y.o.   MRN: [id number]

## 2021-05-21 NOTE — Plan of Care (Signed)
  Problem: Elimination: Goal: Will not experience complications related to bowel motility Outcome: Progressing Goal: Will not experience complications related to urinary retention Outcome: Progressing

## 2021-05-21 NOTE — Progress Notes (Addendum)
PROGRESS NOTE    Elizabeth Melton  KGU:[id number] DOB: 02-Jan-1941 DOA: 05/18/2021 PCP: Kathyrn Lass, MD    Chief Complaint  Patient presents with   Shortness of Breath    Brief Narrative:  Elizabeth Melton is a 80 y.o. female with medical history significant for chronic hypoxic respiratory failure on continuous 2 L nasal cannula, chronic systolic heart failure with most recent echocardiogram in February 2022 showing LVEF 40 to 45%, paroxysmal atrial fibrillation chronically anticoagulated on Eliquis, acute pulmonary embolism in 2021 prior to initiation of Eliquis, hyperlipidemia, chronic interstitial lung disease, who is admitted to [facility] on 05/18/2021 with acute on chronic hypoxic respiratory failure  Subjective:   Breathing continue to improve, she is made aware that she should wear her oxygen when she ambulates  constipation has resolved, she moved her bowels  Blood glucose remain elevated while on prednisone  Assessment & Plan:   Principal Problem:   Acute on chronic respiratory failure with hypoxia (HCC) Active Problems:   Hypothyroidism   Hyperlipidemia   Diabetes mellitus without complication (HCC)   Tobacco abuse   Acute on chronic systolic (congestive) heart failure (HCC)   SOB (shortness of breath)   AF (paroxysmal atrial fibrillation) (HCC)   Acute on chronic congestive heart failure (HCC)   Acute on chronic hypoxic respiratory failure -Likely from CHF exacerbation and progression of pulmonary fibrosis -Cardiology and pulmonology consulted  Acute on chronic combined CHF Diuresing well, edema has improved, will follow cardiology recommendation Advise patient to do daily weight , will set up home health RN for heart failure managment  PAF On Toprol XL and Eliquis   Addendum: NSVT RN notified NSVT on 5:38pm while patient napping,  Sbp in 150 range , will increase Toprol xl Keep K >4, amg >2  Interstitial lung disease Pulmonology input  appreciated, she is started on steroid, might need right heart cath  Will follow pulmonology recommendation  Noninsulin-dependent type 2 diabetes, hyperglycemia -Appear to be diet controlled, I do not see any diabetic medication on home med list -Hyperglycemia likely steroid induced, start Lantus and SSI and meal coverage, continue to adjust -blood glucose remain elevated, continue to adjust insulin, patient will need insulin at least short term while on prednisone, she is in agreement, will need insulin teaching -home health RN to monitor diabetes control  Microcytosis MCV 107 Check B12/folate TSH unremarkable Follow-up with PCP  Body mass index is 27.26 kg/m.Marland Kitchen     Unresulted Labs (From admission, onward)     Start     Ordered   05/22/21 0500  Magnesium  Tomorrow morning,   R       Question:  Specimen collection method  Answer:  Lab=Lab collect   05/21/21 1310   05/20/21 6283  Basic metabolic panel  Daily,   R      05/19/21 1657              DVT prophylaxis: SCDs Start: 05/19/21 0035 SCDs Start: 05/19/21 0031 apixaban (ELIQUIS) tablet 5 mg   Code Status:full  Family Communication: daughter at bedside on 6/10, over the phone on 6/11 Disposition:   Status is: Inpatient   Dispo: The patient is from: home               Anticipated d/c is to: home with home health PT/RN              Anticipated d/c date is: 6/12, need continue adjust insulin dose, insulin teaching, set up home  health                Consultants:  Cardiology  Pulmonary   Procedures:  None  Antimicrobials:    Anti-infectives (From admission, onward)    None          Objective: Vitals:   05/20/21 2139 05/21/21 0500 05/21/21 0605 05/21/21 0831  BP: (!) 147/60  (!) 151/46   Pulse: 72  62   Resp: 18  18   Temp: 98 F (36.7 C)  (!) 97.5 F (36.4 C)   TempSrc: Oral  Oral   SpO2: 95%  100% 91%  Weight:  76.6 kg    Height:        Intake/Output Summary (Last 24 hours) at  05/21/2021 1314 Last data filed at 05/21/2021 1100 Gross per 24 hour  Intake 480 ml  Output 750 ml  Net -270 ml   Filed Weights   05/19/21 1615 05/20/21 0100 05/21/21 0500  Weight: 77.5 kg 76.2 kg 76.6 kg    Examination:  General exam: calm, NAD Respiratory system: Clear to auscultation. Respiratory effort normal. Cardiovascular system: S1 & S2 heard, RRR.  Gastrointestinal system: Abdomen is nondistended, soft and nontender.  Normal bowel sounds heard. Central nervous system: Alert and oriented. No focal neurological deficits. Extremities: Bilateral lower extremity edema has much improved Skin: Chronic bilateral  lower extremity venous stasis skin changes Psychiatry: Judgement and insight appear normal. Mood & affect appropriate.     Data Reviewed: I have personally reviewed following labs and imaging studies  CBC: Recent Labs  Lab 05/18/21 1958 05/19/21 0554  WBC 6.9 5.4  NEUTROABS 4.5 4.8  HGB 14.0 14.3  HCT 44.7 45.6  MCV 108.2* 107.3*  PLT 150 361    Basic Metabolic Panel: Recent Labs  Lab 05/18/21 1958 05/19/21 0554 05/20/21 0455 05/21/21 0541  NA 140 138 138 138  K 3.5 5.2* 4.2 4.4  CL 108 106 103 101  CO2 _0 GLUCOSE 138* 385* 333* 278*  BUN _1 30*  CREATININE 0.85 0.98 0.86 1.08*  CALCIUM 8.5* 8.3* 8.8* 8.7*  MG  --  2.3 2.2  --   PHOS  --  4.1  --   --     GFR: Estimated Creatinine Clearance: 44.9 mL/min (A) (by C-G formula based on SCr of 1.08 mg/dL (H)).  Liver Function Tests: Recent Labs  Lab 05/18/21 1958 05/19/21 0554  AST 24 33  ALT 20 17  ALKPHOS 55 50  BILITOT 1.0 1.1  PROT 7.1 6.9  ALBUMIN 3.2* 3.0*    CBG: Recent Labs  Lab 05/20/21 1141 05/20/21 1702 05/20/21 2236 05/21/21 0732 05/21/21 1125  GLUCAP 328* 183* 168* 276* 297*     Recent Results (from the past 240 hour(s))  Resp Panel by RT-PCR (Flu A&B, Covid) Nasopharyngeal Swab     Status: None   Collection Time: 05/18/21  8:09 PM   Specimen:  Nasopharyngeal Swab; Nasopharyngeal(NP) swabs in vial transport medium  Result Value Ref Range Status   SARS Coronavirus 2 by RT PCR NEGATIVE NEGATIVE Final    Comment: (NOTE) SARS-CoV-2 target nucleic acids are NOT DETECTED.  The SARS-CoV-2 RNA is generally detectable in upper respiratory specimens during the acute phase of infection. The lowest concentration of SARS-CoV-2 viral copies this assay can detect is 138 copies/mL. A negative result does not preclude SARS-Cov-2 infection and should not be used as the sole basis for treatment or other patient management decisions. A negative result  may occur with  improper specimen collection/handling, submission of specimen other than nasopharyngeal swab, presence of viral mutation(s) within the areas targeted by this assay, and inadequate number of viral copies(<138 copies/mL). A negative result must be combined with clinical observations, patient history, and epidemiological information. The expected result is Negative.  Fact Sheet for Patients:  EntrepreneurPulse.com.au  Fact Sheet for Healthcare Providers:  IncredibleEmployment.be  This test is no t yet approved or cleared by the [place] FDA and  has been authorized for detection and/or diagnosis of SARS-CoV-2 by FDA under an Emergency Use Authorization (EUA). This EUA will remain  in effect (meaning this test can be used) for the duration of the COVID-19 declaration under Section 564(b)(1) of the Act, 21 U.S.C.section 360bbb-3(b)(1), unless the authorization is terminated  or revoked sooner.       Influenza A by PCR NEGATIVE NEGATIVE Final   Influenza B by PCR NEGATIVE NEGATIVE Final    Comment: (NOTE) The Xpert Xpress SARS-CoV-2/FLU/RSV plus assay is intended as an aid in the diagnosis of influenza from Nasopharyngeal swab specimens and should not be used as a sole basis for treatment. Nasal washings and aspirates are unacceptable for  Xpert Xpress SARS-CoV-2/FLU/RSV testing.  Fact Sheet for Patients: EntrepreneurPulse.com.au  Fact Sheet for Healthcare Providers: IncredibleEmployment.be  This test is not yet approved or cleared by the [place] FDA and has been authorized for detection and/or diagnosis of SARS-CoV-2 by FDA under an Emergency Use Authorization (EUA). This EUA will remain in effect (meaning this test can be used) for the duration of the COVID-19 declaration under Section 564(b)(1) of the Act, 21 U.S.C. section 360bbb-3(b)(1), unless the authorization is terminated or revoked.  Performed at [facility], [place] [address] [place], [place] [address]          Radiology Studies: No results found.      Scheduled Meds:  apixaban  5 mg Oral BID   arformoterol  15 mcg Nebulization BID   budesonide (PULMICORT) nebulizer solution  0.5 mg Nebulization BID   furosemide  40 mg Oral Daily   insulin aspart  0-9 Units Subcutaneous TID WC   insulin aspart  3 Units Subcutaneous TID WC   insulin glargine  8 Units Subcutaneous BID   levothyroxine  75 mcg Oral Q0600   metoprolol succinate  12.5 mg Oral Daily   nystatin  5 mL Oral QID   potassium chloride  10 mEq Oral Daily   pravastatin  40 mg Oral q1800   predniSONE  40 mg Oral Q breakfast   Followed by   Derrill Memo ON 05/24/2021] predniSONE  30 mg Oral Q breakfast   Followed by   Derrill Memo ON 05/27/2021] predniSONE  20 mg Oral Q breakfast   Followed by   Derrill Memo ON 05/30/2021] predniSONE  10 mg Oral Q breakfast   umeclidinium bromide  1 puff Inhalation Daily   Continuous Infusions:   LOS: 2 days   Time spent: 71mns Greater than 50% of this time was spent in counseling, explanation of diagnosis, planning of further management, and coordination of care.   Voice Recognition /Viviann Sparedictation system was used to create this note, attempts have been made to correct errors. Please contact the author  with questions and/or clarifications.   FFlorencia Reasons MD PhD FACP [facility]  Available via Epic secure chat 7am-7pm for nonurgent issues Please page for urgent issues To page the attending provider between 7A-7P or the covering provider during after hours 7P-7A, please log  into the web site [email or web address] and access using universal  password for that web site. If you do not have the password, please call the hospital operator.    05/21/2021, 1:14 PM

## 2021-05-22 LAB — GLUCOSE, CAPILLARY: Glucose-Capillary: 210 mg/dL — ABNORMAL HIGH (ref 70–99)

## 2021-05-22 LAB — MAGNESIUM: Magnesium: 2.4 mg/dL (ref 1.7–2.4)

## 2021-05-22 LAB — BASIC METABOLIC PANEL
Anion gap: 7 (ref 5–15)
BUN: 31 mg/dL — ABNORMAL HIGH (ref 8–23)
CO2: 28 mmol/L (ref 22–32)
Calcium: 8.9 mg/dL (ref 8.9–10.3)
Chloride: 103 mmol/L (ref 98–111)
Creatinine, Ser: 0.93 mg/dL (ref 0.44–1.00)
GFR, Estimated: >60 mL/min (ref >60)
Glucose, Bld: 235 mg/dL — ABNORMAL HIGH (ref 70–99)
Potassium: 4.1 mmol/L (ref 3.5–5.1)
Sodium: 138 mmol/L (ref 135–145)

## 2021-05-22 MED ORDER — PREDNISONE 10 MG PO TABS: ORAL_TABLET | ORAL | 0 refills | Status: DC

## 2021-05-22 MED ORDER — "PEN NEEDLES 1/2"" 29G X 12MM MISC": 0 refills | Status: AC

## 2021-05-22 MED ORDER — FUROSEMIDE 40 MG PO TABS: 40.0000 mg | ORAL_TABLET | Freq: Every day | ORAL | 0 refills | Status: DC

## 2021-05-22 MED ORDER — SENNOSIDES-DOCUSATE SODIUM 8.6-50 MG PO TABS: 1.0000 | ORAL_TABLET | Freq: Every evening | ORAL | 0 refills | Status: DC | PRN

## 2021-05-22 MED ORDER — INSULIN ASPART 100 UNIT/ML FLEXPEN: PEN_INJECTOR | SUBCUTANEOUS | 0 refills | Status: DC

## 2021-05-22 MED ORDER — INSULIN ASPART 100 UNIT/ML FLEXPEN
PEN_INJECTOR | SUBCUTANEOUS | 0 refills | Status: DC
Start: 2021-05-22 — End: 2021-12-20

## 2021-05-22 MED ORDER — POTASSIUM CHLORIDE ER 10 MEQ PO TBCR: 10.0000 meq | EXTENDED_RELEASE_TABLET | Freq: Every day | ORAL | Status: DC

## 2021-05-22 MED ORDER — INSULIN GLARGINE 100 UNIT/ML SOLOSTAR PEN: 10.0000 [IU] | PEN_INJECTOR | Freq: Every day | SUBCUTANEOUS | 0 refills | Status: DC
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
## 2021-05-22 NOTE — Discharge Summary (Signed)
Discharge Summary  Elizabeth Melton HGD:[id number] DOB: January 11, 1941  PCP: Kathyrn Lass, MD  Admit date: 05/18/2021 Discharge date: 05/22/2021  Time spent: 37mns, more than 50% time spent on coordination of care.   Recommendations for Outpatient Follow-up:  F/u with PCP within a week  for hospital discharge follow up, repeat cbc/bmp at follow up, pcp to monitor blood glucose control while on prednisone, patient is started on insulin to help control blood glucose while on prednisone, she may not need long term insulin F/u with cardiology as scheduled F/u with pulmonology as scheduled on 6/23 with Dr RChase CallerHome health  RN for heart failure Elizabeth diabetes disease management, home health Pt arranged, home o2 resumed   Discharge Diagnoses:  Active Hospital Problems   Diagnosis Date Noted   Acute on chronic respiratory failure with hypoxia ([facility] 05/19/2021   Acute on chronic systolic (congestive) heart failure (HCC) 05/19/2021   SOB (shortness of breath) 05/19/2021   AF (paroxysmal atrial fibrillation) ([facility] 05/19/2021   Acute on chronic congestive heart failure ([facility]    Tobacco abuse 09/18/2018   Hypothyroidism    Hyperlipidemia    Diabetes mellitus without complication ([facility]     Resolved Hospital Problems  No resolved problems to display.    Discharge Condition: stable  Diet recommendation: heart healthy/carb modified  Filed Weights   05/20/21 0100 05/21/21 0500 05/22/21 0500  Weight: 76.2 kg 76.6 kg 77.1 kg    History of present illness: (Per admitting MD Dr. HVelia Meyer Chief Complaint: Shortness of breath   HPI: Elizabeth WASKEYis a 80y.o. female with medical history significant for chronic hypoxic respiratory failure on continuous 2 L nasal cannula, chronic systolic heart failure with most recent echocardiogram in February 2022 showing LVEF 40 to 45%, paroxysmal atrial fibrillation chronically anticoagulated on Eliquis, acute pulmonary embolism in 2021 prior to  initiation of Eliquis, hyperlipidemia, chronic interstitial lung disease, [facility] with acute on chronic hypoxic respiratory failure in the setting of acute on chronic systolic heart failure after [facility] of [facility].    The patient reports 2 weeks of progressive shortness of breath associated with orthopnea, PND, Elizabeth worsening of edema in the bilateral lower extremities, worse on the left lower extremity.  She is unsure as to any recent trend in her weight, as she acknowledges that she does not routinely weigh herself at home.  Reports progression of the above symptoms Elizabeth spite of compliance with her home diuretic regimen which consists of Lasix 20 mg p.o. daily, as well as no significant improvement in the above symptoms in spite of following her outpatient cardiologist recommendation to double her home Lasix dose over the last 3 to 4 days.  Denies any associated recent chest pain, diaphoresis, palpitations, nausea, vomiting, presyncope, or syncope.  Notes associated nonproductive cough, but denies any recent hemoptysis, new lower extremity erythema, or calf tenderness.  No recent trauma or travel.  Denies any recent melena or hematochezia.   Denies any associated subjective fever, chills, rigors, or generalized myalgias. No recent headache, neck stiffness, rhinitis, rhinorrhea, sore throat, abdominal pain, diarrhea, or rash. No known recent COVID-19 exposures. Denies dysuria, gross hematuria, or change in urinary urgency/frequency.    Most recent echocardiogram was performed in February 2022 Elizabeth was notable for LVEF 40 to 45%, with left ventricular demonstrating global hypokinesis, mild concentric LVH, indeterminate diastolic function, right ventricular systolic function found to be mildly reduced, bilateral  atria noted to be severely dilated, moderate mitral regurgitation, Elizabeth mild to moderate tricuspid regurgitation.   The patient confirms history of chronic hypoxic respiratory failure, noting good compliance on her baseline continues 2 L nasal cannula.  In the setting of a history of paroxysmal atrial fibrillation, she also conveys outstanding compliance with her chronic take anticoagulation via Eliquis.   In the setting of progression of the above symptoms, her outpatient cardiologist were chest x-ray 2 days ago, which demonstrated interval worsening of pulmonary edema.  In the setting of further progression of her shortness of breath, edema in the bilateral lower extremities, orthopnea, Elizabeth PND Elizabeth spite of doubling of home Lasix dose over the last 3 to 4 days, the patient presents to [facility] emergency department this evening for further evaluation management of the above       ED Course:  Vital signs in the ED were notable for the following: Heart rate 60-70; blood pressure 112/94 -156/82; respiratory rate 19-26; initial oxygen saturation noted to be 87% on baseline 2 L nasal cannula, with ensuing improvement to 95% on 4 L nasal cannula.   Labs were notable for the following: CMP was notable for the following: Sodium 140 compared to most recent prior value of 142 on 04/29/2021, potassium 3.5, bicarbonate 25, creatinine 0.85, glucose 138, albumin 3.2, otherwise, liver enzymes were found to be within normal limits.  BNP 510 relative to most recent prior value 320 on 04/29/2021.  CBC notable for white blood cell count of 6900, hemoglobin 14.  Nasopharyngeal COVID-19/influenza PCR were performed in the ED this evening Elizabeth found to be negative.   Interpretation of EKG performed in the ED this evening was significantly limited by the presence of artifact, but, within these confines, appears to demonstrate sinus rhythm with PVC, without overt T wave or ST changes.  Chest x-ray shows interval worsening of pulmonary edema superimposed on chronic interstitial lung disease, without pleural effusion or pneumothorax.   While  in the ED, the following were administered: Lasix 40 mg IV x1.  Subsequently, the patient was admitted for further evaluation management of presenting acute on chronic hypoxic respiratory failure in the setting of suspected acute on chronic systolic heart failure.        Hospital Course:  Principal Problem:   Acute on chronic respiratory failure with hypoxia (HCC) Active Problems:   Hypothyroidism   Hyperlipidemia   Diabetes mellitus without complication (HCC)   Tobacco abuse   Acute on chronic systolic (congestive) heart failure (HCC)   SOB (shortness of breath)   AF (paroxysmal atrial fibrillation) (HCC)   Acute on chronic congestive heart failure (HCC)   Acute on chronic hypoxic respiratory failure -Likely from CHF exacerbation Elizabeth progression of pulmonary fibrosis -Cardiology Elizabeth pulmonology consulted, improved, she is cleared to discharge home on meds listed below, she is to follow up with cardiology Elizabeth pulmonology   Acute on chronic combined CHF Diuresing well, edema has resolved,  Advise patient to do daily weight , home health RN for heart failure management She is discharged on lasix 58m daily   PAF On Toprol XL Elizabeth Eliquis     NSVT x1 RN notified NSVT on 5:38pm  6/11 while patient napping, On  Toprol xl Keep K >4, amg >2 F/u with cardiology   Interstitial lung disease Pulmonology input appreciated, she is started on steroid, might need right heart cath She is cleared to discharge home per  pulmonology    Noninsulin-dependent type 2 diabetes, hyperglycemia -Appear  to be diet controlled, I do not see any diabetic medication on home med list, a1c 7% -Hyperglycemia likely steroid induced, blood glucose went up to 386 initially after started on prednisone, start Lantus Elizabeth SSI Elizabeth meal coverage -patient will need insulin short term while on prednisone, she is in agreement -home health RN to monitor diabetes control -Patient is instructed to check blood glucose 3  times a day bring in record for PCP to review, she is discharged on Lantus 10 unit daily for 10 days plus sliding scale NovoLog   Microcytosis MCV 107 B12/folate/TSH unremarkable Follow-up with PCP   Body mass index is 27.26 kg/m.Marland Kitchen      Procedures: none  Consultations: Cardiology Pulmonology Anti-infectives (From admission, onward)    None        Discharge Exam: BP (!) 144/42 (BP Location: Right Arm)   Pulse (!) 52   Temp 97.9 F (36.6 C)   Resp 18   Ht _0  (1.676 m)   Wt 77.1 kg   SpO2 93%   BMI 27.43 kg/m   General: NAD, pleasant  Cardiovascular: RRR Respiratory: CTABL  Discharge Instructions You were cared for by a hospitalist during your hospital stay. If you have any questions about your discharge medications or the care you received while you were in the hospital after you are discharged, you can call the unit Elizabeth asked to speak with the hospitalist on call if the hospitalist that took care of you is not available. Once you are discharged, your primary care physician will handle any further medical issues. Please note that NO REFILLS for any discharge medications will be authorized once you are discharged, as it is imperative that you return to your primary care physician (or establish a relationship with a primary care physician if you do not have one) for your aftercare needs so that they can reassess your need for medications Elizabeth monitor your lab values.  Discharge Instructions     Diet - low sodium heart healthy   Complete by: As directed    Carb modified   Increase activity slowly   Complete by: As directed       Allergies as of 05/22/2021   No Known Allergies      Medication List     TAKE these medications    acetaminophen 500 MG tablet Commonly known as: TYLENOL Take 500 mg by mouth daily as needed for headache (pain).   apixaban 5 MG Tabs tablet Commonly known as: ELIQUIS Take 1 tablet (5 mg total) by mouth 2 (two) times daily.    b complex vitamins tablet Take 1 tablet by mouth daily.   Calcium + D3 600-200 MG-UNIT Tabs Take 2 tablets by mouth daily.   cholecalciferol 25 MCG (1000 UNIT) tablet Commonly known as: VITAMIN D3 Take 1,000 Units by mouth daily.   FISH OIL PO Take 1 capsule by mouth daily.   furosemide 40 MG tablet Commonly known as: LASIX Take 1 tablet (40 mg total) by mouth daily. Start taking on: May 23, 2021 What changed:  medication strength how much to take how to take this when to take this additional instructions   glucose blood test strip 1 each by Other route as needed for other (blood sugar).   insulin aspart 100 UNIT/ML FlexPen Commonly known as: NOVOLOG Insulin sliding scale: Blood sugar  120-150   3units  151-200   4units                       201-250   7units                       251- 300  11units                       301-350   15uints                       351-400   20units                       >400         call MD immediately   insulin glargine 100 UNIT/ML Solostar Pen Commonly known as: LANTUS Inject 10 Units into the skin daily for 10 days.   levothyroxine 75 MCG tablet Commonly known as: SYNTHROID Take 75 mcg by mouth daily before breakfast.   lovastatin 40 MG tablet Commonly known as: MEVACOR Take 40 mg by mouth at bedtime.   metoprolol succinate 25 MG 24 hr tablet Commonly known as: TOPROL-XL Take 0.5 tablets (12.5 mg total) by mouth daily.   nicotine 21 mg/24hr patch Commonly known as: NICODERM CQ - dosed in mg/24 hours Place 21 mg onto the skin daily.   PEN NEEDLES 29GX1/2" 29G X 12MM Misc For insulin injection   potassium chloride 10 MEQ tablet Commonly known as: KLOR-CON Take 1 tablet (10 mEq total) by mouth daily.   predniSONE 10 MG tablet Commonly known as: DELTASONE Take 40 mg with breakfast on 6/13, then take 30 mg daily with breakfast on 6/14, 6/15, 6/16 ,then take 20 mg daily with breakfast on 6/17, 6/18,  6/19 ,then take 10 mg daily with breakfast on 6/20, 6/21, 6/22, then stop. You will see your pulmonologist to discuss further treatment for interstitial lung disease.   senna-docusate 8.6-50 MG tablet Commonly known as: Senokot-S Take 1 tablet by mouth at bedtime as needed for mild constipation.   Smart Sense Thin Lancets 26G Misc 1 each by Does not apply route 2 (two) times daily.   Stiolto Respimat 2.5-2.5 MCG/ACT Aers Generic drug: Tiotropium Bromide-Olodaterol Inhale 2 puffs into the lungs daily.   True Metrix Meter w/Device Kit 1 each by Other route in the morning Elizabeth at bedtime.       No Known Allergies  Follow-up Information     Kathyrn Lass, MD Follow up in 1 week(s).   Specialty: Family Medicine Why: hospital discharge follow up, repeat cbc/bmp at follow up please check your blood sugar three times aday, bring in record for your pcp to review Contact information: [place] [address] [phone]         [name], MD .   Specialties: Cardiology, Radiology Why: please weight yourself daily, bring in weight record to your heart doctor appointment if your weight go up 3lbs in three days Elizabeth your started to have swelling in legs , please call your heart doctor Elizabeth ask for advice regarding lasix dose adjustment Contact information: [address] [place] [address] [phone]                  The results of significant diagnostics from this hospitalization (including imaging, microbiology, ancillary Elizabeth laboratory) are listed below for reference.    Significant Diagnostic Studies:  DG Chest 2 View  Result Date: 05/16/2021 CLINICAL DATA:  Shortness of breath. EXAM: CHEST - 2 VIEW COMPARISON:  Chest x-ray dated March 22, 2021. FINDINGS: Unchanged mild cardiomegaly. Mid Elizabeth basilar predominant interstitial thickening Elizabeth hazy airspace opacity, similar to prior study. No pleural effusion or pneumothorax. No  acute osseous abnormality. IMPRESSION: 1. Unchanged chronic interstitial lung disease with superimposed pulmonary edema or multifocal pneumonia. Electronically Signed   By: Titus Dubin M.D.   On: 05/16/2021 12:02   DG Chest Port 1 View  Result Date: 05/18/2021 CLINICAL DATA:  Shortness of breath. EXAM: PORTABLE CHEST 1 VIEW COMPARISON:  May 16, 2021. FINDINGS: Stable cardiomediastinal silhouette. No pneumothorax is noted. Slightly increased bilateral reticular densities are noted throughout both lungs, most consistent with chronic interstitial lung disease with superimposed pulmonary edema or atypical inflammation. Bony thorax is unremarkable. IMPRESSION: Slightly increased bilateral lung opacities are noted most consistent with chronic interstitial lung disease with superimposed pulmonary edema or atypical inflammation. Aortic Atherosclerosis (ICD10-I70.0). Electronically Signed   By: Marijo Conception M.D.   On: 05/18/2021 19:59   ECHOCARDIOGRAM COMPLETE  Result Date: 05/19/2021    ECHOCARDIOGRAM REPORT   Patient Name:   LILLIAHNA SCHUBRING Date of Exam: 05/19/2021 Medical Rec #:  [id number]          Height:       66.0 in Accession #:    [id number]         Weight:       174.0 lb Date of Birth:  30-Nov-1941          BSA:          1.885 m Patient Age:    80 years           BP:           148/57 mmHg Patient Gender: F                  HR:           67 bpm. Exam Location:  Inpatient Procedure: 2D Echo, Cardiac Doppler Elizabeth Color Doppler Indications:    CHF  History:        Patient has prior history of Echocardiogram examinations, most                 recent 01/22/2021. CHF, Arrythmias:Atrial Fibrillation,                 Signs/Symptoms:Shortness of Breath; Risk Factors:Diabetes,                 Dyslipidemia Elizabeth Hypertension. Lung disease, resp. faliure.  Sonographer:    Dustin Flock Referring Phys: [id number] [place]  1. Left ventricular ejection fraction, by estimation, is 60 to 65%. The left  ventricle has normal function. The left ventricle has no regional wall motion abnormalities. Left ventricular diastolic parameters are consistent with Grade II diastolic dysfunction (pseudonormalization).  2. Right ventricular systolic function is normal. The right ventricular size is mildly enlarged. There is severely elevated pulmonary artery systolic pressure. The estimated right ventricular systolic pressure is 76.8 mmHg.  3. Left atrial size was mildly dilated.  4. Right atrial size was mildly dilated.  5. The mitral valve is degenerative. Mild mitral valve regurgitation. No evidence of mitral stenosis. Moderate mitral annular calcification.  6. The tricuspid valve is abnormal. Tricuspid valve regurgitation is moderate.  7. The aortic valve is tricuspid. Aortic valve regurgitation is mild. Mild to moderate aortic valve sclerosis/calcification is present,  without any evidence of aortic stenosis.  8. The inferior vena cava is dilated in size with <50% respiratory variability, suggesting right atrial pressure of 15 mmHg. FINDINGS  Left Ventricle: Left ventricular ejection fraction, by estimation, is 60 to 65%. The left ventricle has normal function. The left ventricle has no regional wall motion abnormalities. The left ventricular internal cavity size was normal in size. There is  no left ventricular hypertrophy. Left ventricular diastolic parameters are consistent with Grade II diastolic dysfunction (pseudonormalization). Right Ventricle: The right ventricular size is mildly enlarged. No increase in right ventricular wall thickness. Right ventricular systolic function is normal. There is severely elevated pulmonary artery systolic pressure. The tricuspid regurgitant velocity is 4.09 m/s, Elizabeth with an assumed right atrial pressure of 15 mmHg, the estimated right ventricular systolic pressure is 14.4 mmHg. Left Atrium: Left atrial size was mildly dilated. Right Atrium: Right atrial size was mildly dilated.  Pericardium: There is no evidence of pericardial effusion. Mitral Valve: The mitral valve is degenerative in appearance. There is mild calcification of the mitral valve leaflet(s). Moderate mitral annular calcification. Mild mitral valve regurgitation. No evidence of mitral valve stenosis. Tricuspid Valve: The tricuspid valve is abnormal. Tricuspid valve regurgitation is moderate. Aortic Valve: The aortic valve is tricuspid. Aortic valve regurgitation is mild. Aortic regurgitation PHT measures 425 msec. Mild to moderate aortic valve sclerosis/calcification is present, without any evidence of aortic stenosis. Aortic valve mean gradient measures 8.0 mmHg. Aortic valve peak gradient measures 18.0 mmHg. Aortic valve area, by VTI measures 1.84 cm. Pulmonic Valve: The pulmonic valve was normal in structure. Pulmonic valve regurgitation is not visualized. Aorta: The aortic root is normal in size Elizabeth structure. Venous: The inferior vena cava is dilated in size with less than 50% respiratory variability, suggesting right atrial pressure of 15 mmHg. IAS/Shunts: No atrial level shunt detected by color flow Doppler.  LEFT VENTRICLE PLAX 2D LVIDd:         4.60 cm  Diastology LVIDs:         3.00 cm  LV e' medial:    6.42 cm/s LV PW:         1.10 cm  LV E/e' medial:  18.5 LV IVS:        1.10 cm  LV e' lateral:   7.29 cm/s LVOT diam:     2.00 cm  LV E/e' lateral: 16.3 LV SV:         77 LV SV Index:   41 LVOT Area:     3.14 cm  RIGHT VENTRICLE RV Basal diam:  4.10 cm RV S prime:     9.03 cm/s TAPSE (M-mode): 1.9 cm LEFT ATRIUM           Index       RIGHT ATRIUM           Index LA diam:      4.20 cm 2.23 cm/m  RA Area:     18.00 cm LA Vol (A2C): 56.0 ml 29.71 ml/m RA Volume:   52.80 ml  28.01 ml/m LA Vol (A4C): 57.1 ml 30.29 ml/m  AORTIC VALVE AV Area (Vmax):    1.60 cm AV Area (Vmean):   1.49 cm AV Area (VTI):     1.84 cm AV Vmax:           212.00 cm/s AV Vmean:          134.000 cm/s AV VTI:            0.418 m  AV Peak  Grad:      18.0 mmHg AV Mean Grad:      8.0 mmHg LVOT Vmax:         108.00 cm/s LVOT Vmean:        63.500 cm/s LVOT VTI:          0.245 m LVOT/AV VTI ratio: 0.59 AI PHT:            425 msec  AORTA Ao Root diam: 3.00 cm MITRAL VALVE                TRICUSPID VALVE MV Area (PHT): 2.50 cm     TR Peak grad:   66.9 mmHg MV Decel Time: 304 msec     TR Vmax:        409.00 cm/s MV E velocity: 119.00 cm/s MV A velocity: 97.40 cm/s   SHUNTS MV E/A ratio:  1.22         Systemic VTI:  0.24 m                             Systemic Diam: 2.00 cm Loralie Champagne MD Electronically signed by Loralie Champagne MD Signature Date/Time: 05/19/2021/3:58:31 PM    Final     Microbiology: Recent Results (from the past 240 hour(s))  Resp Panel by RT-PCR (Flu A&B, Covid) Nasopharyngeal Swab     Status: None   Collection Time: 05/18/21  8:09 PM   Specimen: Nasopharyngeal Swab; Nasopharyngeal(NP) swabs in vial transport medium  Result Value Ref Range Status   SARS Coronavirus 2 by RT PCR NEGATIVE NEGATIVE Final    Comment: (NOTE) SARS-CoV-2 target nucleic acids are NOT DETECTED.  The SARS-CoV-2 RNA is generally detectable in upper respiratory specimens during the acute phase of infection. The lowest concentration of SARS-CoV-2 viral copies this assay can detect is 138 copies/mL. A negative result does not preclude SARS-Cov-2 infection Elizabeth should not be used as the sole basis for treatment or other patient management decisions. A negative result may occur with  improper specimen collection/handling, submission of specimen other than nasopharyngeal swab, presence of viral mutation(s) within the areas targeted by this assay, Elizabeth inadequate number of viral copies(<138 copies/mL). A negative result must be combined with clinical observations, patient history, Elizabeth epidemiological information. The expected result is Negative.  Fact Sheet for Patients:  EntrepreneurPulse.com.au  Fact Sheet for Healthcare Providers:   IncredibleEmployment.be  This test is no t yet approved or cleared by the [place] FDA Elizabeth  has been authorized for detection Elizabeth/or diagnosis of SARS-CoV-2 by FDA under an Emergency Use Authorization (EUA). This EUA will remain  in effect (meaning this test can be used) for the duration of the COVID-19 declaration under Section 564(b)(1) of the Act, 21 U.S.C.section 360bbb-3(b)(1), unless the authorization is terminated  or revoked sooner.       Influenza A by PCR NEGATIVE NEGATIVE Final   Influenza B by PCR NEGATIVE NEGATIVE Final    Comment: (NOTE) The Xpert Xpress SARS-CoV-2/FLU/RSV plus assay is intended as an aid in the diagnosis of influenza from Nasopharyngeal swab specimens Elizabeth should not be used as a sole basis for treatment. Nasal washings Elizabeth aspirates are unacceptable for Xpert Xpress SARS-CoV-2/FLU/RSV testing.  Fact Sheet for Patients: EntrepreneurPulse.com.au  Fact Sheet for Healthcare Providers: IncredibleEmployment.be  This test is not yet approved or cleared by the [place] FDA Elizabeth has been authorized for detection Elizabeth/or diagnosis of SARS-CoV-2 by FDA under  an Emergency Use Authorization (EUA). This EUA will remain in effect (meaning this test can be used) for the duration of the COVID-19 declaration under Section 564(b)(1) of the Act, 21 U.S.C. section 360bbb-3(b)(1), unless the authorization is terminated or revoked.  Performed at [facility], [place] [address], [place], [place] [address]      Labs: Basic Metabolic Panel: Recent Labs  Lab 05/18/21 1958 05/19/21 0554 05/20/21 0455 05/21/21 0541 05/22/21 0538  NA 140 138 138 138 138  K 3.5 5.2* 4.2 4.4 4.1  CL 108 106 103 101 103  CO2 _0 GLUCOSE 138* 385* 333* 278* 235*  BUN _1 30* 31*  CREATININE 0.85 0.98 0.86 1.08* 0.93  CALCIUM 8.5* 8.3* 8.8* 8.7* 8.9  MG  --  2.3 2.2  --  2.4  PHOS  --   4.1  --   --   --    Liver Function Tests: Recent Labs  Lab 05/18/21 1958 05/19/21 0554  AST 24 33  ALT 20 17  ALKPHOS 55 50  BILITOT 1.0 1.1  PROT 7.1 6.9  ALBUMIN 3.2* 3.0*   No results for input(s): LIPASE, AMYLASE in the last 168 hours. No results for input(s): AMMONIA in the last 168 hours. CBC: Recent Labs  Lab 05/18/21 1958 05/19/21 0554  WBC 6.9 5.4  NEUTROABS 4.5 4.8  HGB 14.0 14.3  HCT 44.7 45.6  MCV 108.2* 107.3*  PLT 150 152   Cardiac Enzymes: No results for input(s): CKTOTAL, CKMB, CKMBINDEX, TROPONINI in the last 168 hours. BNP: BNP (last 3 results) Recent Labs    01/24/21 0322 04/29/21 1553 05/18/21 1958  BNP 137.5* 319.3* 509.8*    ProBNP (last 3 results) No results for input(s): PROBNP in the last 8760 hours.  CBG: Recent Labs  Lab 05/21/21 0732 05/21/21 1125 05/21/21 1624 05/21/21 2103 05/22/21 0718  GLUCAP 276* 297* 366* 203* 210*       Signed:  [name] MD, PhD, FACP  [facility] 05/22/2021, 11:38 AM

## 2021-05-22 NOTE — TOC Transition Note (Signed)
Transition of Care [facility]) - CM/SW Discharge Note   Patient Details  Name: SALONI LABLANC MRN: [id number] Date of Birth: June 22, 1941  [facility]) CM/SW Contact:  Servando Snare, LCSW Phone Number: 05/22/2021, 10:59 AM   Clinical Narrative: Patient dc home with home health. HH arranged with [facility]. Patient has concerns with concentrator running out of o2. [facility] contacted and will contact patient in the morning.     Final next level of care: Home w Home Health Services Barriers to Discharge: No Barriers Identified   Patient Goals and CMS Choice Patient states their goals for this hospitalization and ongoing recovery are:: Return home   Choice offered to / list presented to : NA  Discharge Placement                       Discharge Plan and Services                DME Arranged: N/A DME Agency: NA       HH Arranged: PT, RN [place] Agency: [place] Date [facility] Agency Contacted: 05/22/21 Time [place]: 0347 Representative spoke with at [place]: [place] ([place]) Interventions     Readmission Risk Interventions Readmission Risk Prevention Plan 09/10/2020  Post Dischage Appt Complete  Medication Screening Complete  Transportation Screening Complete  Some recent data might be hidden

## 2021-05-22 NOTE — Progress Notes (Signed)
Progress Note  Patient Name: Elizabeth Melton Date of Encounter: 05/22/2021  [facility] Cardiologist: [name], MD   Subjective   Seems ready for d/c   Inpatient Medications    Scheduled Meds:  apixaban  5 mg Oral BID   arformoterol  15 mcg Nebulization BID   budesonide (PULMICORT) nebulizer solution  0.5 mg Nebulization BID   furosemide  40 mg Oral Daily   insulin aspart  0-9 Units Subcutaneous TID WC   insulin aspart  4 Units Subcutaneous TID WC   insulin glargine  10 Units Subcutaneous BID   levothyroxine  75 mcg Oral Q0600   metoprolol succinate  25 mg Oral Daily   nystatin  5 mL Oral QID   potassium chloride  10 mEq Oral Daily   pravastatin  40 mg Oral q1800   predniSONE  40 mg Oral Q breakfast   Followed by   Derrill Memo ON 05/24/2021] predniSONE  30 mg Oral Q breakfast   Followed by   Derrill Memo ON 05/27/2021] predniSONE  20 mg Oral Q breakfast   Followed by   Derrill Memo ON 05/30/2021] predniSONE  10 mg Oral Q breakfast   umeclidinium bromide  1 puff Inhalation Daily   Continuous Infusions:  PRN Meds: acetaminophen **OR** acetaminophen, albuterol, senna-docusate   Vital Signs    Vitals:   05/21/21 2106 05/22/21 0500 05/22/21 0557 05/22/21 0725  BP: (!) 135/45  (!) 144/42   Pulse: (!) 52  (!) 52   Resp: 18  18   Temp: 97.7 F (36.5 C)  97.9 F (36.6 C)   TempSrc:      SpO2: 91%  98% 93%  Weight:  77.1 kg    Height:        Intake/Output Summary (Last 24 hours) at 05/22/2021 0846 Last data filed at 05/22/2021 0600 Gross per 24 hour  Intake 960 ml  Output 350 ml  Net 610 ml   Last 3 Weights 05/22/2021 05/21/2021 05/20/2021  Weight (lbs) 169 lb 15.6 oz 168 lb 14 oz 167 lb 15.9 oz  Weight (kg) 77.1 kg 76.6 kg 76.2 kg      Telemetry    SR with PACs - Personally Reviewed 05/22/2021   ECG    No new - Personally Reviewed  Physical Exam   GEN: No acute distress.   Neck: + JVD at 30 degrees Cardiac: RRR, 2/6 systolic murmur, no rubs, or  gallops.  Respiratory: left lung improved less rhonchi right still with and exp wheezing  GI: Soft, nontender, non-distended  MS: No edema resolved; No deformity. Neuro:  Nonfocal  Psych: Normal affect   Labs    High Sensitivity Troponin:  No results for input(s): TROPONINIHS in the last 720 hours.    Chemistry Recent Labs  Lab 05/18/21 1958 05/19/21 0554 05/20/21 0455 05/21/21 0541 05/22/21 0538  NA 140 138 138 138 138  K 3.5 5.2* 4.2 4.4 4.1  CL 108 106 103 101 103  CO2 _0 GLUCOSE 138* 385* 333* 278* 235*  BUN _1 30* 31*  CREATININE 0.85 0.98 0.86 1.08* 0.93  CALCIUM 8.5* 8.3* 8.8* 8.7* 8.9  PROT 7.1 6.9  --   --   --   ALBUMIN 3.2* 3.0*  --   --   --   AST 24 33  --   --   --   ALT 20 17  --   --   --   ALKPHOS 55 50  --   --   --  BILITOT 1.0 1.1  --   --   --   GFRNONAA >60 59* >60 53* >60  ANIONGAP _0 Hematology Recent Labs  Lab 05/18/21 1958 05/19/21 0554  WBC 6.9 5.4  RBC 4.13 4.25  HGB 14.0 14.3  HCT 44.7 45.6  MCV 108.2* 107.3*  MCH 33.9 33.6  MCHC 31.3 31.4  RDW 15.9* 15.9*  PLT 150 152    BNP Recent Labs  Lab 05/18/21 1958  BNP 509.8*     DDimer No results for input(s): DDIMER in the last 168 hours.   Radiology    No results found.  Cardiac Studies   Echo 05/19/21 IMPRESSIONS     1. Left ventricular ejection fraction, by estimation, is 60 to 65%. The  left ventricle has normal function. The left ventricle has no regional  wall motion abnormalities. Left ventricular diastolic parameters are  consistent with Grade II diastolic  dysfunction (pseudonormalization).   2. Right ventricular systolic function is normal. The right ventricular  size is mildly enlarged. There is severely elevated pulmonary artery  systolic pressure. The estimated right ventricular systolic pressure is  13.6 mmHg.   3. Left atrial size was mildly dilated.   4. Right atrial size was mildly dilated.   5. The mitral valve is  degenerative. Mild mitral valve regurgitation. No  evidence of mitral stenosis. Moderate mitral annular calcification.   6. The tricuspid valve is abnormal. Tricuspid valve regurgitation is  moderate.   7. The aortic valve is tricuspid. Aortic valve regurgitation is mild.  Mild to moderate aortic valve sclerosis/calcification is present, without  any evidence of aortic stenosis.   8. The inferior vena cava is dilated in size with <50% respiratory  variability, suggesting right atrial pressure of 15 mmHg.   FINDINGS   Left Ventricle: Left ventricular ejection fraction, by estimation, is 60  to 65%. The left ventricle has normal function. The left ventricle has no  regional wall motion abnormalities. The left ventricular internal cavity  size was normal in size. There is   no left ventricular hypertrophy. Left ventricular diastolic parameters  are consistent with Grade II diastolic dysfunction (pseudonormalization).   Right Ventricle: The right ventricular size is mildly enlarged. No  increase in right ventricular wall thickness. Right ventricular systolic  function is normal. There is severely elevated pulmonary artery systolic  pressure. The tricuspid regurgitant  velocity is 4.09 m/s, and with an assumed right atrial pressure of 15  mmHg, the estimated right ventricular systolic pressure is 43.8 mmHg.   Left Atrium: Left atrial size was mildly dilated.   Right Atrium: Right atrial size was mildly dilated.   Pericardium: There is no evidence of pericardial effusion.   Mitral Valve: The mitral valve is degenerative in appearance. There is  mild calcification of the mitral valve leaflet(s). Moderate mitral annular  calcification. Mild mitral valve regurgitation. No evidence of mitral  valve stenosis.   Tricuspid Valve: The tricuspid valve is abnormal. Tricuspid valve  regurgitation is moderate.   Aortic Valve: The aortic valve is tricuspid. Aortic valve regurgitation is  mild.  Aortic regurgitation PHT measures 425 msec. Mild to moderate aortic  valve sclerosis/calcification is present, without any evidence of aortic  stenosis. Aortic valve mean  gradient measures 8.0 mmHg. Aortic valve peak gradient measures 18.0 mmHg.  Aortic valve area, by VTI measures 1.84 cm.   Pulmonic Valve: The pulmonic valve was normal in structure. Pulmonic valve  regurgitation is not visualized.  Aorta: The aortic root is normal in size and structure.   Venous: The inferior vena cava is dilated in size with less than 50%  respiratory variability, suggesting right atrial pressure of 15 mmHg.   IAS/Shunts: No atrial level shunt detected by color flow Doppler.   Patient Profile     80 y.o. female  with a hx of submassive PE, interstitial lung disease, diabetes, hyperlipidemia, HFpEF who was admitted on 01/16/2021 with COVID-19 pneumonia, atrial fim on BB and eliquis, mod MR, EF 40-45% mild to mod TR moderate AI, mildly reduced RV function now admitted with ILD and acute CHF.   Assessment & Plan   Acute on chronic combined systolic and diastolic HF in combination with interstitial lung disease - Has diuresed well On admit with elevated BNP.  Hx of EF 40-45% with mild RV failure while in a fib in Feb 2022.  now with normal EF to 60-65% G2DD, though PA systolic pressure is 51.0    D/c with lasix 40 mg daily  Interstitial lung disease and chronic hypoxia, pt not always compliant with 02 and decompensates.  IM is addressing with solumedrol and currently on higher 02 requirement than at home. CCM has seen as well and pulmonary meds adjusted. They feel if no improvement Rt heart cath may be warranted .   She does feel better PAF in hospital with COVID 19 PNA 01/2021 - she converted to SR on amiodarone but with lung issues amio was stopped. On eliquis and torpol XL 12.5 daily  did have Bradycardia with amio and BB.    Moderate AR and Moderate MR. These have improved with maintaining SR.  Mod. TR       Hx of coronary calcifications on low dose CT chest in 2019 on LM, LAD and RCA. Seen again in 2022. Hx tobacco use but has stopped smoking since Feb.       Cardiology will sign off as most of her current issues Are related to her advance pulmonary Disease  Lasix 40 mg daily new for her      For questions or updates, please contact [place] Please consult [email or web address] for contact info under        Signed, Jenkins Rouge, MD  05/22/2021, 8:46 AM    Patient examined chart reviewed lungs still with diffuse rhonchi good diuresis  Continue lasix She is constipated needs laxative. Primary issue is multiple lung insults with COPD/ILD/PE and Covid. May benefit from pulmonary rehab  Jenkins Rouge MD [facility]

## 2021-05-25 DIAGNOSIS — E1165 Type 2 diabetes mellitus with hyperglycemia: Secondary | ICD-10-CM | POA: Diagnosis not present

## 2021-05-25 DIAGNOSIS — I110 Hypertensive heart disease with heart failure: Secondary | ICD-10-CM | POA: Diagnosis not present

## 2021-05-25 DIAGNOSIS — I083 Combined rheumatic disorders of mitral, aortic and tricuspid valves: Secondary | ICD-10-CM | POA: Diagnosis not present

## 2021-05-25 DIAGNOSIS — J9621 Acute and chronic respiratory failure with hypoxia: Secondary | ICD-10-CM | POA: Diagnosis not present

## 2021-05-25 DIAGNOSIS — I480 Paroxysmal atrial fibrillation: Secondary | ICD-10-CM | POA: Diagnosis not present

## 2021-05-25 DIAGNOSIS — E785 Hyperlipidemia, unspecified: Secondary | ICD-10-CM | POA: Diagnosis not present

## 2021-05-25 DIAGNOSIS — E039 Hypothyroidism, unspecified: Secondary | ICD-10-CM | POA: Diagnosis not present

## 2021-05-25 DIAGNOSIS — I5023 Acute on chronic systolic (congestive) heart failure: Secondary | ICD-10-CM | POA: Diagnosis not present

## 2021-05-25 DIAGNOSIS — J8410 Pulmonary fibrosis, unspecified: Secondary | ICD-10-CM | POA: Diagnosis not present

## 2021-05-26 DIAGNOSIS — E1165 Type 2 diabetes mellitus with hyperglycemia: Secondary | ICD-10-CM | POA: Diagnosis not present

## 2021-05-26 DIAGNOSIS — I083 Combined rheumatic disorders of mitral, aortic and tricuspid valves: Secondary | ICD-10-CM | POA: Diagnosis not present

## 2021-05-26 DIAGNOSIS — I110 Hypertensive heart disease with heart failure: Secondary | ICD-10-CM | POA: Diagnosis not present

## 2021-05-26 DIAGNOSIS — J8410 Pulmonary fibrosis, unspecified: Secondary | ICD-10-CM | POA: Diagnosis not present

## 2021-05-26 DIAGNOSIS — R0602 Shortness of breath: Secondary | ICD-10-CM | POA: Diagnosis not present

## 2021-05-26 DIAGNOSIS — I480 Paroxysmal atrial fibrillation: Secondary | ICD-10-CM | POA: Diagnosis not present

## 2021-05-26 DIAGNOSIS — E039 Hypothyroidism, unspecified: Secondary | ICD-10-CM | POA: Diagnosis not present

## 2021-05-26 DIAGNOSIS — I5023 Acute on chronic systolic (congestive) heart failure: Secondary | ICD-10-CM | POA: Diagnosis not present

## 2021-05-26 DIAGNOSIS — E785 Hyperlipidemia, unspecified: Secondary | ICD-10-CM | POA: Diagnosis not present

## 2021-05-26 DIAGNOSIS — J9621 Acute and chronic respiratory failure with hypoxia: Secondary | ICD-10-CM | POA: Diagnosis not present

## 2021-05-26 DIAGNOSIS — U071 COVID-19: Secondary | ICD-10-CM | POA: Diagnosis not present

## 2021-05-27 DIAGNOSIS — J8410 Pulmonary fibrosis, unspecified: Secondary | ICD-10-CM | POA: Diagnosis not present

## 2021-05-27 DIAGNOSIS — I5023 Acute on chronic systolic (congestive) heart failure: Secondary | ICD-10-CM | POA: Diagnosis not present

## 2021-05-27 DIAGNOSIS — J849 Interstitial pulmonary disease, unspecified: Secondary | ICD-10-CM | POA: Diagnosis not present

## 2021-05-27 DIAGNOSIS — I083 Combined rheumatic disorders of mitral, aortic and tricuspid valves: Secondary | ICD-10-CM | POA: Diagnosis not present

## 2021-05-27 DIAGNOSIS — J9621 Acute and chronic respiratory failure with hypoxia: Secondary | ICD-10-CM | POA: Diagnosis not present

## 2021-05-27 DIAGNOSIS — E785 Hyperlipidemia, unspecified: Secondary | ICD-10-CM | POA: Diagnosis not present

## 2021-05-27 DIAGNOSIS — Z6829 Body mass index (BMI) 29.0-29.9, adult: Secondary | ICD-10-CM | POA: Diagnosis not present

## 2021-05-27 DIAGNOSIS — E039 Hypothyroidism, unspecified: Secondary | ICD-10-CM | POA: Diagnosis not present

## 2021-05-27 DIAGNOSIS — E1165 Type 2 diabetes mellitus with hyperglycemia: Secondary | ICD-10-CM | POA: Diagnosis not present

## 2021-05-27 DIAGNOSIS — I480 Paroxysmal atrial fibrillation: Secondary | ICD-10-CM | POA: Diagnosis not present

## 2021-05-27 DIAGNOSIS — E1169 Type 2 diabetes mellitus with other specified complication: Secondary | ICD-10-CM | POA: Diagnosis not present

## 2021-05-27 DIAGNOSIS — J9611 Chronic respiratory failure with hypoxia: Secondary | ICD-10-CM | POA: Diagnosis not present

## 2021-05-27 DIAGNOSIS — I110 Hypertensive heart disease with heart failure: Secondary | ICD-10-CM | POA: Diagnosis not present

## 2021-05-27 DIAGNOSIS — Z794 Long term (current) use of insulin: Secondary | ICD-10-CM | POA: Diagnosis not present

## 2021-05-30 ENCOUNTER — Other Ambulatory Visit: Payer: Self-pay

## 2021-05-30 ENCOUNTER — Ambulatory Visit (HOSPITAL_COMMUNITY): Payer: Medicare HMO | Attending: Cardiology

## 2021-05-30 DIAGNOSIS — I5042 Chronic combined systolic (congestive) and diastolic (congestive) heart failure: Secondary | ICD-10-CM | POA: Diagnosis not present

## 2021-05-30 LAB — ECHOCARDIOGRAM COMPLETE
AR max vel: 1.14 cm2
AV Area VTI: 1.24 cm2
AV Area mean vel: 1.21 cm2
AV Mean grad: 7.3 mmHg
AV Peak grad: 15.2 mmHg
Ao pk vel: 1.95 m/s
Area-P 1/2: 3.6 cm2
MV M vel: 5.84 m/s
MV Peak grad: 136.4 mmHg
P 1/2 time: 625 msec
S' Lateral: 2.92 cm

## 2021-05-31 DIAGNOSIS — I083 Combined rheumatic disorders of mitral, aortic and tricuspid valves: Secondary | ICD-10-CM | POA: Diagnosis not present

## 2021-05-31 DIAGNOSIS — I4891 Unspecified atrial fibrillation: Secondary | ICD-10-CM | POA: Diagnosis not present

## 2021-05-31 DIAGNOSIS — J9621 Acute and chronic respiratory failure with hypoxia: Secondary | ICD-10-CM | POA: Diagnosis not present

## 2021-05-31 DIAGNOSIS — I10 Essential (primary) hypertension: Secondary | ICD-10-CM | POA: Diagnosis not present

## 2021-05-31 DIAGNOSIS — I480 Paroxysmal atrial fibrillation: Secondary | ICD-10-CM | POA: Diagnosis not present

## 2021-05-31 DIAGNOSIS — I5023 Acute on chronic systolic (congestive) heart failure: Secondary | ICD-10-CM | POA: Diagnosis not present

## 2021-05-31 DIAGNOSIS — E785 Hyperlipidemia, unspecified: Secondary | ICD-10-CM | POA: Diagnosis not present

## 2021-05-31 DIAGNOSIS — J8410 Pulmonary fibrosis, unspecified: Secondary | ICD-10-CM | POA: Diagnosis not present

## 2021-05-31 DIAGNOSIS — E1169 Type 2 diabetes mellitus with other specified complication: Secondary | ICD-10-CM | POA: Diagnosis not present

## 2021-05-31 DIAGNOSIS — J449 Chronic obstructive pulmonary disease, unspecified: Secondary | ICD-10-CM | POA: Diagnosis not present

## 2021-05-31 DIAGNOSIS — E039 Hypothyroidism, unspecified: Secondary | ICD-10-CM | POA: Diagnosis not present

## 2021-05-31 DIAGNOSIS — E1165 Type 2 diabetes mellitus with hyperglycemia: Secondary | ICD-10-CM | POA: Diagnosis not present

## 2021-05-31 DIAGNOSIS — I110 Hypertensive heart disease with heart failure: Secondary | ICD-10-CM | POA: Diagnosis not present

## 2021-05-31 DIAGNOSIS — M810 Age-related osteoporosis without current pathological fracture: Secondary | ICD-10-CM | POA: Diagnosis not present

## 2021-06-01 DIAGNOSIS — J9621 Acute and chronic respiratory failure with hypoxia: Secondary | ICD-10-CM | POA: Diagnosis not present

## 2021-06-01 DIAGNOSIS — E785 Hyperlipidemia, unspecified: Secondary | ICD-10-CM | POA: Diagnosis not present

## 2021-06-01 DIAGNOSIS — I5023 Acute on chronic systolic (congestive) heart failure: Secondary | ICD-10-CM | POA: Diagnosis not present

## 2021-06-01 DIAGNOSIS — I480 Paroxysmal atrial fibrillation: Secondary | ICD-10-CM | POA: Diagnosis not present

## 2021-06-01 DIAGNOSIS — J8410 Pulmonary fibrosis, unspecified: Secondary | ICD-10-CM | POA: Diagnosis not present

## 2021-06-01 DIAGNOSIS — I083 Combined rheumatic disorders of mitral, aortic and tricuspid valves: Secondary | ICD-10-CM | POA: Diagnosis not present

## 2021-06-01 DIAGNOSIS — I110 Hypertensive heart disease with heart failure: Secondary | ICD-10-CM | POA: Diagnosis not present

## 2021-06-01 DIAGNOSIS — E039 Hypothyroidism, unspecified: Secondary | ICD-10-CM | POA: Diagnosis not present

## 2021-06-01 DIAGNOSIS — E1165 Type 2 diabetes mellitus with hyperglycemia: Secondary | ICD-10-CM | POA: Diagnosis not present

## 2021-06-02 ENCOUNTER — Encounter: Payer: Self-pay | Admitting: Internal Medicine

## 2021-06-02 ENCOUNTER — Ambulatory Visit: Payer: Medicare HMO | Admitting: Internal Medicine

## 2021-06-02 ENCOUNTER — Telehealth: Payer: Self-pay | Admitting: Internal Medicine

## 2021-06-02 ENCOUNTER — Other Ambulatory Visit: Payer: Self-pay

## 2021-06-02 VITALS — BP 140/70 | HR 54 | Ht 66.0 in | Wt 164.0 lb

## 2021-06-02 DIAGNOSIS — J849 Interstitial pulmonary disease, unspecified: Secondary | ICD-10-CM

## 2021-06-02 DIAGNOSIS — I288 Other diseases of pulmonary vessels: Secondary | ICD-10-CM | POA: Diagnosis not present

## 2021-06-02 DIAGNOSIS — R911 Solitary pulmonary nodule: Secondary | ICD-10-CM | POA: Diagnosis not present

## 2021-06-02 DIAGNOSIS — Z720 Tobacco use: Secondary | ICD-10-CM | POA: Diagnosis not present

## 2021-06-02 DIAGNOSIS — Z87891 Personal history of nicotine dependence: Secondary | ICD-10-CM | POA: Diagnosis not present

## 2021-06-02 NOTE — Progress Notes (Signed)
0  Subjective:  Patient ID: Elizabeth Melton, female , DOB: 09-28-1941 , age 80 y.o. , MRN: [id number] , ADDRESS: [address] [place] [address]   09/04/2019 -   Chief Complaint  Patient presents with   Pulmonary Consult    Pt referred here by Dr. Drema Dallas for abnormal CT chest. Pt c/o DOE X years - states this is stable. Pt denies cough and CP/tightness and f/c/s.      HPI Elizabeth Melton 80 y.o. -presents to the interstitial lung disease clinic for evaluation of pulmonary fibrosis.  She was seen by the lung nodule program approximately a year ago when lung cancer screening CT chest showed interstitial lung disease changes.  The CT scan also showed a 7 mm right middle lobe nodule by the minor fissure.  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  [.  We then asked her to do interstitial lung disease questionnaire and serology and show for the clinic.  However she did not.  She is here today a year later.  In the interim she did have another CT scan of the chest.  The CT scan of the chest is not high-resolution CT chest.  I visualized the scan.  The 7 mm right middle lobe nodule has enlarged.  The ILD changes are still present.  She tells me that she skipped visit a year ago because of insurance issues.  [facility] Comprehensive ILD Questionnaire  Symptoms: Reports insidious onset of shortness of breath for the last few to several years.  He does the same since onset.  She also reports its episodic dyspnea.  SYMPTOM SCALE - ILD 09/04/2019   O2 use ra  Shortness of Breath 0 -> 5 scale with 5 being worst (score 6 If unable to do)  At rest 0  Simple tasks - showers, clothes change, eating, shaving 3  Household (dishes, doing bed, laundry) 3  Shopping 3  Walking level at own pace 3  Walking keeping up with others  of same age 15  Walking up Stairs 4.5  Walking up Hill 4.5  Total (40 - 48) Dyspnea Score x  How bad is your cough? x  How bad is your fatigue x    Her activities are limited by joint pain.  She also reports a cough it is the same since it started.  It is mild in intensity.  Occasionally brings out clear white phlegm.  It does affect her voice and she does clear her throat and she does feel a tickle in her throat.  No hemoptysis.    Past Medical History : Positive for diabetes blood clots not otherwise specified but negative for asthma, COPD, heart failure, rheumatoid arthritis, scleroderma, lupus, polymyositis, Sjogren's, GERD or hiatal hernia, sleep apnea, pulmonary hypertension, stroke.  No seizures.  Denies any heart disease or pleurisy   ROS: Positive for fatigue for the last few years and arthralgia for the last few years.  Also dry mouth for the last few years.  Does admit to snoring for unclear amount of time.  Has nonspecific rash for the last few weeks.  Denies any dysphagia or acid reflux or weight loss or ulcers in the mouth or vagina.   FAMILY HISTORY of LUNG DISEASE: Denies any COPD or pulmonary fibrosis or hypersensitivity pneumonitis   EXPOSURE  HISTORY: Continues to smoke cigarettes since age 43.  He currently smokes 10 cigarettes a day.  She smokes cigars in the past.  Never smoked.  No electronic cigarette use.  No marijuana use currently but did smoke marijuana 20 years ago.  No cocaine use.  No intravenous drug use.   HOME and HOBBY DETAILS : Lives in a single-family [place] home for the last 34 years.  Extensive organic antigen exposure in the house history is negative.  In particular noted to have environment.  No more than will be on the shower curtain.  No mold or mildew anywhere in the house.  Does use a humidifier but no more than it.  Does not use a steam iron.  Does not have a Jacuzzi of swimming pool.  No misting found inside the house.  No pet birds or parakeets.   No pet gerbils.  Does not use feather pillows.  Does not play any wind instruments.  Does not do much gardening.   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  Has worked as a Theme park manager.  Has worked as a Insurance claims handler with cosmetics.  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  Details are not known.  She has worked opening In jars of relaxer chemicals and during this time was exposed with his gas and fumes.  Rest are negative.   PULMONARY TOXICITY HISTORY (27 items):  -Denies         10/23/2019 Follow up : ILD , O2 RF  Patient returns for a 51-monthfollow-up.  She was seen for pulmonary consult last visit for possible pulmonary fibrosis noted on screening CT chest.  Patient is currently an active smoker.  Pulmonary function testing done today shows no airflow obstruction or restriction.  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  Moderate emphysema.  Dilation of pulmonary trunk concerning for pulmonary artery hypertension.  And aortic atherosclerosis. Overnight oximetry test 09/19/19/2020 showed nocturnal desaturations.  Patient was started on oxygen 2 L at nighttime.   Feels that since starting oxygen she is feeling better especially in the morning hours. She says she does get short of breath with activity.  But remains active and is able to do all of her housework and cooking without any difficulty.  Says she does occasionally have to stop and rest.  She does have some cough that is minimally productive. We discussed her test results.  And recent CT chest findings. She does not want to begin meds at this time, did discuss that she would discuss later dater if necessay but does not want to .   We discussed smoking cessation.  Declines pharm referral for smoking cessation   No Known Allergies  Study Conclusions   - Left  ventricle: The cavity size was normal. Wall thickness was    normal. Systolic function was normal. The estimated ejection    fraction was in the range of 60% to 65%. Wall motion was normal;    there were no regional wall motion abnormalities. Normal GLS at    -18%. Doppler parameters are consistent with abnormal left    ventricular relaxation (grade 1 diastolic dysfunction). The E/e&'    ratio is >15, suggesting elevated LV filling pressure.  - Aortic valve: Trileaflet. Sclerosis without stenosis. There was    mild to moderate regurgitation.  - Mitral valve: Mildly thickened leaflets .  There was mild to    moderate regurgitation.  - Left atrium: Moderately dilated.  - Tricuspid valve: There was moderate regurgitation.  - Pulmonary arteries: PA peak pressure: 48 mm Hg (S).  - Inferior vena cava: The vessel was normal in size. The    respirophasic diameter changes were in the normal range (>= 50%),    consistent with normal central venous pressure.   Impressions:   - Compared to a prior study in 2017, there are few changes. There    is mild to moderate AI, MR and moderate TR with a stable RVSP of    48 mmHg.    ROS - per HPI   OV 02/25/2020  Subjective:  Patient ID: Elizabeth Melton, female , DOB: [birth year] , age 73 y.o. 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 -presents for follow-up.  Last visit with me was September 2020.  In the interim followed up with nurse practitioner nov 2020.  Initially was given nighttime oxygen.  She said it  helped but later she is now saying it did not help.  So she discontinued it.  She says it has made no difference.  In the interim she is continue to smoke.  She has significant amount of dyspnea burden as documented below she is also very fatigued.  However she is more bothered right now by her left knee pain and left ankle pain.  She is uses a cane chronically but the pain in her left ankle is been present only for a few days.  She got a talk about this with her primary care physician.  She continues to smoke.  She is not on any inhalers for emphysema.  At last visit with nurse practitioner she was reluctant to take antifibrotic's.     OV 11/22/2020   Subjective:  Patient ID: Elizabeth Melton, female , DOB: 01-Mar-1941, age 70 y.o. years. , MRN: [id number],  ADDRESS: [place] [address] PCP  Leighton Ruff, MD Providers : Treatment Team:  Attending Provider: Brand Males, MD Patient Care Team: Leighton Ruff, MD as PCP - General (Family Medicine) Dorothy Spark, MD as PCP - Cardiology (Cardiology)    Chief Complaint  Patient presents with   Follow-up    Breathing is unchanged since her last visit.     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 -returns for her ILD follow-up.  Last seen in spring 2021.  After that a try to set her up for a right heart catheterization but in the interim she got pulmonary embolism in September 2021.  She has seen Dr. Jenetta Downer for sleep apnea work-up.  She continues to smoke.  For associated emphysema she takes Spiriva.  For her ILD she continues to be reluctant to have any biopsy or take antifibrotic's.  She expresses again to me today.  She does not want a right heart catheterization either.  On the other hand she is willing to have an expectant follow-up with serial monitoring.  This is because she is afraid of side effects.  Overall she is stable.  Not using oxygen.  She is more limited by sciatic pain in her feet.  This was evidenced in a walking desaturation test today.  Symptom scores are listed below.  In terms of pulmonary embolism: We discussed potential risk factors.  She denies any family history but it appears obesity sedentary lifestyle pulmonary fibrosis and ongoing smoking or risk factors.  I counseled her about quitting smoking.  She plans to do that.   Results for VICKY, SCHLEICH (MRN [id number]) as of 02/25/2020 12:29  Ref. Range 09/04/2019 14:19  CK Total Latest Ref Range: 29 - 143 U/L 196 (H)   Results for TERALYN, MULLINS (MRN [id number]) as of 02/25/2020 12:29  Ref. Range 09/04/2019 14:19  Anti Nuclear Antibody (ANA) Latest Ref Range: NEGATIVE  POSITIVE (A)  ANA Pattern 1 Unknown Cytoplasmic (A)  ANA Titer 1 Latest Units: titer 1:80 (H)  Results for EUVA, RUNDELL (MRN [id number]) as of 02/25/2020 12:29  Ref. Range 09/04/2019 14:19  Sed Rate Latest Ref Range: 0 - 30 mm/hr 40 (H)       OV 06/02/2021  Subjective:  Patient  ID: Elizabeth Melton, female , DOB: Mar 21, 1941 , age 21 y.o. , MRN: [id number] , ADDRESS: [place] [address] PCP Kathyrn Lass, MD Patient Care Team: Kathyrn Lass, MD as PCP - General (Family Medicine) [name], MD as PCP - Cardiology (Cardiology)  This Provider for this visit: Treatment Team:  Attending Provider: Brand Males, MD    06/02/2021 -   Chief Complaint  Patient presents with   Follow-up    Pt states that she is about the same since last visit. Still becomes SOB with exertion and has an occ cough.    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 -last seen in December 2021.  She was supposed to see me in the spring 2022 with CT scan and PFT but this did not get done.  Review of the chart indicates she had a diagnosis of systolic heart failure in February 2022.  She had another CT angiogram chest that ruled out pulmonary embolism.   Then she had another admission in June 2022 with acute on chronic heart failure exacerbation but this time the echo showed diastolic dysfunction grade 2 and severe pulmonary hypertension.  She was admitted.  She had both pulmonary and cardiology consultation.  Somewhere along the way according to her history in April 2022 ended up on 3 L oxygen and also using a Rollator.  She got discharged to her daughter's home.  She currently feeling improved and stable requiring 3 L continuous.  Her functional status is deteriorated although she is feeling better compared to the hospitalization.  Her son Elberta Fortis is here with her for the first time meeting him.  He is unaware of pulmonary cardiac issues in detail.  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  After this discussion she seems more willing to entertain the concept of antifibrotic's.  At this point in time with oxygen need lung biopsy could be high risk.  We talked about right heart catheterization and she is open to the idea.  I sent a message Dr. Beatrix Fetters her cardiologist.       SYMPTOM SCALE - ILD 02/25/2020  11/22/2020  06/02/2021   O2 use ra ra 3L [place] since April 2022 along with rollator  Shortness of Breath 0 -> 5 scale with 5 being worst (score 6 If unable to do)    At rest 1.5 0 1  Simple tasks - showers, clothes change, eating, shaving _0 Household (dishes, doing bed, laundry) 2._1 Shopping 3._2 Walking level at own pace 3._3 Walking up Stairs 4._4 Total (30-36) Dyspnea Score 18._5 How bad is your cough? 0 0 0  How bad is your fatigue 2.5 3   How bad is nausea 0 0   How bad is vomiting?  0 0   How bad is diarrhea? 0 2   How bad is anxiety? 0 2   How bad is depression 0 2          Simple office walk 185 feet x  3 laps goal with forehead probe 09/04/2019  02/25/2020  11/22/2020   O2 used RA ra ra  Number laps  completed 3 attempted but at stopped at 1 due to dyspnea an left knee pain Did not walk due to left heel pain. Has cane Did only 1 lap - stopped due to feet burn  Comments about pace slow    Resting Pulse Ox/HR 99% and 66/min  97% and 77/min  Final Pulse Ox/HR 93% and 105/min  94% and 105/min  Desaturated </= 88% No but did not do all 3  No,  Desaturated <= 3% points yes  Uyes, 3 points  Got Tachycardic >/= 90/min Yes, 6 points  yes  Symptoms at end of test Dyspnea  and left knee pain  Stopped due to feet pain and some dyspnea but mostly feet  Miscellaneous comments Stopped early due to knee pain and dyspnea       CT Chest data  ECHOCARDIOGRAM COMPLETE  Result Date: 05/30/2021    ECHOCARDIOGRAM REPORT   Patient Name:   JOLISA INTRIAGO Date of Exam: 05/30/2021 Medical Rec #:  [id number]          Height:       66.0 in Accession #:    [id number]         Weight:       170.0 lb Date of Birth:  09/20/1941          BSA:          1.866 m Patient Age:    31 years           BP:           142/50 mmHg Patient Gender: F                  HR:           59 bpm. Exam Location:  [place] Procedure: 2D Echo, Cardiac Doppler and Color Doppler Indications:    I50.9 Congestive heart failure  History:        Patient has prior history of Echocardiogram examinations, most                 recent 05/19/2021. Risk Factors:Hypertension, Diabetes and                 Dyslipidemia. Hypothyroidism. Acute systolic heart failure.  Sonographer:    Wilford Sports Rodgers-Jones RDCS Referring Phys: [id number] [place]  1. Left ventricular ejection fraction, by estimation, is 60 to 65%. The left ventricle has normal function. The left ventricle has no regional wall motion abnormalities. Left ventricular diastolic parameters are consistent with Grade II diastolic dysfunction (pseudonormalization). Elevated left ventricular end-diastolic pressure.  2. Right ventricular systolic function is low normal. The right  ventricular size is mildly enlarged. There is severely elevated pulmonary artery systolic pressure. The estimated right ventricular systolic pressure is 23.5 mmHg.  3. Left atrial size was moderately dilated.  4. Right atrial size was mild to moderately dilated.  5. The mitral valve is degenerative. Moderate mitral valve regurgitation. No evidence of mitral stenosis. Moderate mitral annular calcification.  6. Tricuspid valve regurgitation is moderate.  7. The aortic valve is calcified. There is moderate calcification of the aortic valve. There is moderate thickening of the aortic valve. Aortic valve regurgitation is mild to moderate. Mild aortic valve stenosis.  8. Aortic dilatation noted. There is borderline dilatation of the ascending aorta, measuring 38 mm. Comparison(s): No significant change from prior study. FINDINGS  Left Ventricle: Left ventricular ejection fraction, by estimation, is 60 to 65%. The left ventricle has normal function. The left ventricle has no regional wall motion abnormalities. The left ventricular internal cavity size was normal in size. There is  no left ventricular hypertrophy. Left ventricular diastolic parameters are consistent with Grade II diastolic dysfunction (pseudonormalization). Elevated left ventricular end-diastolic pressure. Right Ventricle: The right ventricular size is mildly enlarged. Right vetricular wall thickness was not well visualized. Right ventricular systolic function is low normal. There is severely elevated pulmonary artery systolic pressure. The tricuspid regurgitant velocity is 3.69 m/s, and with an assumed right atrial pressure of 8 mmHg, the estimated right ventricular systolic pressure is 57.3 mmHg. Left Atrium: Left atrial size was  moderately dilated. Right Atrium: Right atrial size was mild to moderately dilated. Pericardium: There is no evidence of pericardial effusion. Mitral Valve: The mitral valve is degenerative in appearance. There is mild thickening  of the mitral valve leaflet(s). There is mild calcification of the mitral valve leaflet(s). Moderate mitral annular calcification. Moderate mitral valve regurgitation.  No evidence of mitral valve stenosis. Tricuspid Valve: The tricuspid valve is normal in structure. Tricuspid valve regurgitation is moderate . No evidence of tricuspid stenosis. Aortic Valve: The aortic valve is calcified. There is moderate calcification of the aortic valve. There is moderate thickening of the aortic valve. Aortic valve regurgitation is mild to moderate. Aortic regurgitation PHT measures 625 msec. Mild aortic stenosis is present. Aortic valve mean gradient measures 7.2 mmHg. Aortic valve peak gradient measures 15.2 mmHg. Aortic valve area, by VTI measures 1.24 cm. Pulmonic Valve: The pulmonic valve was not well visualized. Pulmonic valve regurgitation is not visualized. No evidence of pulmonic stenosis. Aorta: Aortic dilatation noted. There is borderline dilatation of the ascending aorta, measuring 38 mm. Venous: The inferior vena cava was not well visualized. IAS/Shunts: The atrial septum is grossly normal.  LEFT VENTRICLE PLAX 2D LVIDd:         4.40 cm  Diastology LVIDs:         2.92 cm  LV e' medial:    5.77 cm/s LV PW:         1.00 cm  LV E/e' medial:  20.6 LV IVS:        1.00 cm  LV e' lateral:   5.98 cm/s LVOT diam:     1.70 cm  LV E/e' lateral: 19.9 LV SV:         54 LV SV Index:   29 LVOT Area:     2.27 cm  RIGHT VENTRICLE RV Basal diam:  4.30 cm RV S prime:     12.70 cm/s TAPSE (M-mode): 1.7 cm LEFT ATRIUM             Index       RIGHT ATRIUM           Index LA diam:        5.10 cm 2.73 cm/m  RA Area:     18.30 cm LA Vol (A2C):   77.0 ml 41.26 ml/m RA Volume:   49.90 ml  26.74 ml/m LA Vol (A4C):   96.4 ml 51.66 ml/m LA Biplane Vol: 88.5 ml 47.42 ml/m  AORTIC VALVE AV Area (Vmax):    1.14 cm AV Area (Vmean):   1.21 cm AV Area (VTI):     1.24 cm AV Vmax:           195.25 cm/s AV Vmean:          128.250 cm/s AV VTI:             0.436 m AV Peak Grad:      15.2 mmHg AV Mean Grad:      7.2 mmHg LVOT Vmax:         97.90 cm/s LVOT Vmean:        68.400 cm/s LVOT VTI:          0.238 m LVOT/AV VTI ratio: 0.55 AI PHT:            625 msec  AORTA Ao Root diam: 3.10 cm Ao Asc diam:  3.80 cm MITRAL VALVE                TRICUSPID VALVE MV  Area (PHT): 3.60 cm     TR Peak grad:   54.5 mmHg MV Decel Time: 211 msec     TR Vmax:        369.00 cm/s MR Peak grad: 136.4 mmHg MR Vmax:      584.00 cm/s   SHUNTS MV E velocity: 119.00 cm/s  Systemic VTI:  0.24 m MV A velocity: 97.00 cm/s   Systemic Diam: 1.70 cm MV E/A ratio:  1.23 Buford Dresser MD Electronically signed by Buford Dresser MD Signature Date/Time: 05/30/2021/3:42:20 PM    Final       PFT  PFT Results Latest Ref Rng & Units 03/17/2020 10/23/2019  FVC-Pre L 2.15 2.10  FVC-Predicted Pre % 103 100  Pre FEV1/FVC % % 74 73  FEV1-Pre L 1.59 1.53  FEV1-Predicted Pre % 99 95  DLCO uncorrected ml/min/mmHg 8.93 8.62  DLCO UNC% % 47 46  DLCO corrected ml/min/mmHg 8.93 -  DLCO COR %Predicted % 47 -  DLVA Predicted % 72 59       has a past medical history of Acute on chronic systolic (congestive) heart failure ([place]) (05/19/2021), Diabetes mellitus without complication ([place]), Hyperlipidemia, Hypertension, Hypothyroidism, and Thrombocytopenia ([place]).   reports that she quit smoking about 3 weeks ago. Her smoking use included cigarettes. She has a 45.00 pack-year smoking history. She quit smokeless tobacco use about 58 years ago.  Past Surgical History:  Procedure Laterality Date   TUBAL LIGATION      No Known Allergies  Immunization History  Administered Date(s) Administered   Pneumococcal Conjugate-13 06/03/2014   Pneumococcal Polysaccharide-23 06/25/2007    Family History  Problem Relation Age of Onset   Diabetes Mother    Kidney disease Mother    Hypertension Mother    Other Father        tuberculosis   Hypertension Sister    Hyperlipidemia Sister       Current Outpatient Medications:    acetaminophen (TYLENOL) 500 MG tablet, Take 500 mg by mouth daily as needed for headache (pain). , Disp: , Rfl:    apixaban (ELIQUIS) 5 MG TABS tablet, Take 1 tablet (5 mg total) by mouth 2 (two) times daily., Disp: 60 tablet, Rfl: 1   b complex vitamins tablet, Take 1 tablet by mouth daily., Disp: , Rfl:    Blood Glucose Monitoring Suppl (TRUE METRIX METER) w/Device KIT, 1 each by Other route in the morning and at bedtime., Disp: , Rfl:    Calcium Carb-Cholecalciferol (CALCIUM + D3) 600-200 MG-UNIT TABS, Take 2 tablets by mouth daily. , Disp: , Rfl:    cholecalciferol (VITAMIN D3) 25 MCG (1000 UT) tablet, Take 1,000 Units by mouth daily., Disp: , Rfl:    furosemide (LASIX) 40 MG tablet, Take 1 tablet (40 mg total) by mouth daily., Disp: 30 tablet, Rfl: 0   glucose blood test strip, 1 each by Other route as needed for other (blood sugar)., Disp: , Rfl:    insulin aspart (NOVOLOG) 100 UNIT/ML FlexPen, Insulin sliding scale: Blood sugar  120-150   3units                       151-200   4units                       201-250   7units                       251- 300  11units                       301-350   15uints                       351-400   20units                       >400         call MD immediately, Disp: 15 mL, Rfl: 0   Insulin Pen Needle (PEN NEEDLES 29GX1/2") 29G X 12MM MISC, For insulin injection, Disp: 50 each, Rfl: 0   levothyroxine (SYNTHROID, LEVOTHROID) 75 MCG tablet, Take 75 mcg by mouth daily before breakfast. , Disp: , Rfl:    lovastatin (MEVACOR) 40 MG tablet, Take 40 mg by mouth at bedtime., Disp: , Rfl:    metoprolol succinate (TOPROL-XL) 25 MG 24 hr tablet, Take 0.5 tablets (12.5 mg total) by mouth daily., Disp: 30 tablet, Rfl: 3   Omega-3 Fatty Acids (FISH OIL PO), Take 1 capsule by mouth daily., Disp: , Rfl:    potassium chloride (KLOR-CON) 10 MEQ tablet, Take 1 tablet (10 mEq total) by mouth daily., Disp: , Rfl:    predniSONE (DELTASONE)  10 MG tablet, Take 40 mg with breakfast on 6/13, then take 30 mg daily with breakfast on 6/14, 6/15, 6/16 ,then take 20 mg daily with breakfast on 6/17, 6/18, 6/19 ,then take 10 mg daily with breakfast on 6/20, 6/21, 6/22, then stop. You will see your pulmonologist to discuss further treatment for interstitial lung disease., Disp: 22 tablet, Rfl: 0   SMART SENSE THIN LANCETS 26G MISC, 1 each by Does not apply route 2 (two) times daily., Disp: , Rfl:    Tiotropium Bromide-Olodaterol (STIOLTO RESPIMAT) 2.5-2.5 MCG/ACT AERS, Inhale 2 puffs into the lungs daily., Disp: 1 each, Rfl: 1   insulin glargine (LANTUS) 100 UNIT/ML Solostar Pen, Inject 10 Units into the skin daily for 10 days., Disp: 15 mL, Rfl: 0   nicotine (NICODERM CQ - DOSED IN MG/24 HOURS) 21 mg/24hr patch, Place 21 mg onto the skin daily. (Patient not taking: Reported on 06/02/2021), Disp: , Rfl:    senna-docusate (SENOKOT-S) 8.6-50 MG tablet, Take 1 tablet by mouth at bedtime as needed for mild constipation. (Patient not taking: Reported on 06/02/2021), Disp: 14 tablet, Rfl: 0      Objective:   Vitals:   06/02/21 0924  BP: 140/70  Pulse: (!) 54  SpO2: 94%  Weight: 164 lb (74.4 kg)  Height: _0  (1.676 m)    Estimated body mass index is 26.47 kg/m as calculated from the following:   Height as of this encounter: _1  (1.676 m).   Weight as of this encounter: 164 lb (74.4 kg).  _2 @  [facility] Weights   06/02/21 0924  Weight: 164 lb (74.4 kg)     Physical Exam  General: No distress. Looks well Neuro: Alert and Oriented x 3. GCS 15. Speech normal Psych: Pleasant Resp:  Barrel Chest - no.  Wheeze - no, Crackles - no, No overt respiratory distress CVS: Normal heart sounds. Murmurs - no Ext: Stigmata of Connective Tissue Disease - no HEENT: Normal upper airway. PEERL +. No post nasal drip        Assessment:       ICD-10-CM   1. ILD (interstitial lung disease) ([facility])  J84.9     2. Solitary pulmonary nodule   R91.1  3. Tobacco abuse  Z72.0     4. Personal history of tobacco use, presenting hazards to health  Z87.891     5. 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 - Previously reluctant to undergo biopsy or start antifibrotic's but you might reconsider medications now based on resstaging   PLAN -  Do high-resolution CT chest nex few to several weeks -Do spirometry and DLCO in first avaiable - continue 3L [place]   Pulmonary emphysema   Stable disease  Plan -Continue Spiriva   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 RChase Callerin 30-minute slot  ( Level 05 visit: Estb 40-54 min   in  visit type: on-site physical face to visit  in total care time and counseling or/and coordination of care by this undersigned MD - Dr MBrand Males This includes one or more of the following on this same day 06/02/2021: pre-charting, chart review, note writing, documentation discussion of test results, diagnostic or treatment recommendations, prognosis, risks and benefits of management options, instructions, education, compliance or  risk-factor reduction. It excludes time spent by the [place] office staff in the care of the patient. Actual time 40 min)   SIGNATURE    Dr. MBrand Males M.D., F.C.C.P,  Pulmonary and Critical Care Medicine Staff Physician, [place]Director - Interstitial Lung Disease  Program  [place] [id number] Pager: [phone] If no answer or between  15:00h - 7:00h: call [phone] Telephone: [phone]  10:20 AM 06/02/2021

## 2021-06-02 NOTE — Telephone Encounter (Signed)
Dear Josetta Huddle if some of her pulmonary hypertension on the echo could be related to [facility] group 3.  If so inhaled treprostinil would be indicated.  Therefore considering and wondering if we should do a right heart catheterization on her?  I did entertain this idea in the past last year but then she ended up with a PE and we decided to hold off but now obviously she is worse and is on oxygen.  Thoughts appreciated  Thanks    SIGNATURE    Dr. Brand Males, M.D., F.C.C.P,  Pulmonary and Critical Care Medicine Staff Physician, [place] Director - Interstitial Lung Disease  Program  Pulmonary [place] at [place], [place], [address]  Pager: [phone], If no answer  OR between  19:00-7:00h: page [phone] Telephone (clinical office): [phone] Telephone (research): [phone]  10:13 AM 06/02/2021

## 2021-06-02 NOTE — Addendum Note (Signed)
Addended by: Lorretta Harp on: 06/02/2021 10:26 AM   Modules accepted: Orders

## 2021-06-02 NOTE — Patient Instructions (Addendum)
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 WHICH CAN BE PROGRESSIVE - Previously reluctant to undergo biopsy or start antifibrotic's but you might reconsider medications now based on resstaging   PLAN -  Do high-resolution CT chest nex few to several weeks -Do spirometry and DLCO in first avaiable - continue 3L [place]   Pulmonary emphysema   Stable disease  Plan -Continue Spiriva   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 RChase Callerin 30-minute slot

## 2021-06-03 DIAGNOSIS — I480 Paroxysmal atrial fibrillation: Secondary | ICD-10-CM | POA: Diagnosis not present

## 2021-06-03 DIAGNOSIS — I083 Combined rheumatic disorders of mitral, aortic and tricuspid valves: Secondary | ICD-10-CM | POA: Diagnosis not present

## 2021-06-03 DIAGNOSIS — I110 Hypertensive heart disease with heart failure: Secondary | ICD-10-CM | POA: Diagnosis not present

## 2021-06-03 DIAGNOSIS — I5023 Acute on chronic systolic (congestive) heart failure: Secondary | ICD-10-CM | POA: Diagnosis not present

## 2021-06-03 DIAGNOSIS — E039 Hypothyroidism, unspecified: Secondary | ICD-10-CM | POA: Diagnosis not present

## 2021-06-03 DIAGNOSIS — E785 Hyperlipidemia, unspecified: Secondary | ICD-10-CM | POA: Diagnosis not present

## 2021-06-03 DIAGNOSIS — J9621 Acute and chronic respiratory failure with hypoxia: Secondary | ICD-10-CM | POA: Diagnosis not present

## 2021-06-03 DIAGNOSIS — J8410 Pulmonary fibrosis, unspecified: Secondary | ICD-10-CM | POA: Diagnosis not present

## 2021-06-03 DIAGNOSIS — E1165 Type 2 diabetes mellitus with hyperglycemia: Secondary | ICD-10-CM | POA: Diagnosis not present

## 2021-06-03 NOTE — Telephone Encounter (Signed)
Sounds like a good idea, we will get her scheduled Thanks, Gerald Stabs

## 2021-06-03 NOTE — Telephone Encounter (Signed)
Thanks. Will close messae. Reply not needed

## 2021-06-06 DIAGNOSIS — I5023 Acute on chronic systolic (congestive) heart failure: Secondary | ICD-10-CM | POA: Diagnosis not present

## 2021-06-06 DIAGNOSIS — E1165 Type 2 diabetes mellitus with hyperglycemia: Secondary | ICD-10-CM | POA: Diagnosis not present

## 2021-06-06 DIAGNOSIS — J8410 Pulmonary fibrosis, unspecified: Secondary | ICD-10-CM | POA: Diagnosis not present

## 2021-06-06 DIAGNOSIS — I480 Paroxysmal atrial fibrillation: Secondary | ICD-10-CM | POA: Diagnosis not present

## 2021-06-06 DIAGNOSIS — E785 Hyperlipidemia, unspecified: Secondary | ICD-10-CM | POA: Diagnosis not present

## 2021-06-06 DIAGNOSIS — I083 Combined rheumatic disorders of mitral, aortic and tricuspid valves: Secondary | ICD-10-CM | POA: Diagnosis not present

## 2021-06-06 DIAGNOSIS — I110 Hypertensive heart disease with heart failure: Secondary | ICD-10-CM | POA: Diagnosis not present

## 2021-06-06 DIAGNOSIS — J9621 Acute and chronic respiratory failure with hypoxia: Secondary | ICD-10-CM | POA: Diagnosis not present

## 2021-06-06 DIAGNOSIS — E039 Hypothyroidism, unspecified: Secondary | ICD-10-CM | POA: Diagnosis not present

## 2021-06-08 DIAGNOSIS — I083 Combined rheumatic disorders of mitral, aortic and tricuspid valves: Secondary | ICD-10-CM | POA: Diagnosis not present

## 2021-06-08 DIAGNOSIS — I5023 Acute on chronic systolic (congestive) heart failure: Secondary | ICD-10-CM | POA: Diagnosis not present

## 2021-06-08 DIAGNOSIS — J8410 Pulmonary fibrosis, unspecified: Secondary | ICD-10-CM | POA: Diagnosis not present

## 2021-06-08 DIAGNOSIS — I480 Paroxysmal atrial fibrillation: Secondary | ICD-10-CM | POA: Diagnosis not present

## 2021-06-08 DIAGNOSIS — I110 Hypertensive heart disease with heart failure: Secondary | ICD-10-CM | POA: Diagnosis not present

## 2021-06-08 DIAGNOSIS — E039 Hypothyroidism, unspecified: Secondary | ICD-10-CM | POA: Diagnosis not present

## 2021-06-08 DIAGNOSIS — J9621 Acute and chronic respiratory failure with hypoxia: Secondary | ICD-10-CM | POA: Diagnosis not present

## 2021-06-08 DIAGNOSIS — E785 Hyperlipidemia, unspecified: Secondary | ICD-10-CM | POA: Diagnosis not present

## 2021-06-08 DIAGNOSIS — E1165 Type 2 diabetes mellitus with hyperglycemia: Secondary | ICD-10-CM | POA: Diagnosis not present

## 2021-06-09 ENCOUNTER — Telehealth: Payer: Self-pay | Admitting: Cardiology

## 2021-06-09 DIAGNOSIS — J8410 Pulmonary fibrosis, unspecified: Secondary | ICD-10-CM | POA: Diagnosis not present

## 2021-06-09 DIAGNOSIS — E1165 Type 2 diabetes mellitus with hyperglycemia: Secondary | ICD-10-CM | POA: Diagnosis not present

## 2021-06-09 DIAGNOSIS — I5023 Acute on chronic systolic (congestive) heart failure: Secondary | ICD-10-CM | POA: Diagnosis not present

## 2021-06-09 DIAGNOSIS — R399 Unspecified symptoms and signs involving the genitourinary system: Secondary | ICD-10-CM | POA: Diagnosis not present

## 2021-06-09 DIAGNOSIS — I083 Combined rheumatic disorders of mitral, aortic and tricuspid valves: Secondary | ICD-10-CM | POA: Diagnosis not present

## 2021-06-09 DIAGNOSIS — J9621 Acute and chronic respiratory failure with hypoxia: Secondary | ICD-10-CM | POA: Diagnosis not present

## 2021-06-09 DIAGNOSIS — N390 Urinary tract infection, site not specified: Secondary | ICD-10-CM | POA: Diagnosis not present

## 2021-06-09 DIAGNOSIS — I110 Hypertensive heart disease with heart failure: Secondary | ICD-10-CM | POA: Diagnosis not present

## 2021-06-09 DIAGNOSIS — I480 Paroxysmal atrial fibrillation: Secondary | ICD-10-CM | POA: Diagnosis not present

## 2021-06-09 DIAGNOSIS — E039 Hypothyroidism, unspecified: Secondary | ICD-10-CM | POA: Diagnosis not present

## 2021-06-09 DIAGNOSIS — E785 Hyperlipidemia, unspecified: Secondary | ICD-10-CM | POA: Diagnosis not present

## 2021-06-09 NOTE — Telephone Encounter (Signed)
Facility was calling to see if we received the fax that they faxed over for a patient on the 23rd regard a order. Please advise

## 2021-06-09 NOTE — Telephone Encounter (Signed)
Still awaiting form - RN made aware fax machine is not working at present time   Called  left message to refax information to [phone].

## 2021-06-09 NOTE — Telephone Encounter (Signed)
Spoke to [place], informed her - RN did not see order form. Asked to refax. She verbalized that she will do it now.  RN waiting for fax

## 2021-06-10 ENCOUNTER — Other Ambulatory Visit: Payer: Self-pay

## 2021-06-10 ENCOUNTER — Ambulatory Visit (INDEPENDENT_AMBULATORY_CARE_PROVIDER_SITE_OTHER)
Admission: RE | Admit: 2021-06-10 | Discharge: 2021-06-10 | Disposition: A | Discharge status: Home / Self Care | Payer: Medicare HMO | Source: Ambulatory Visit | Attending: Internal Medicine | Admitting: Internal Medicine

## 2021-06-10 DIAGNOSIS — J479 Bronchiectasis, uncomplicated: Secondary | ICD-10-CM | POA: Diagnosis not present

## 2021-06-10 DIAGNOSIS — J849 Interstitial pulmonary disease, unspecified: Secondary | ICD-10-CM

## 2021-06-10 DIAGNOSIS — J439 Emphysema, unspecified: Secondary | ICD-10-CM | POA: Diagnosis not present

## 2021-06-10 DIAGNOSIS — J189 Pneumonia, unspecified organism: Secondary | ICD-10-CM | POA: Diagnosis not present

## 2021-06-10 DIAGNOSIS — R911 Solitary pulmonary nodule: Secondary | ICD-10-CM

## 2021-06-10 DIAGNOSIS — I700 Atherosclerosis of aorta: Secondary | ICD-10-CM | POA: Diagnosis not present

## 2021-06-10 NOTE — Telephone Encounter (Signed)
Attempt to call Elizabeth Melton at [facility] message to defer to PCP  Orders for O2 level sats for covid-balance training, diabetic foot care.

## 2021-06-15 DIAGNOSIS — J9621 Acute and chronic respiratory failure with hypoxia: Secondary | ICD-10-CM | POA: Diagnosis not present

## 2021-06-15 DIAGNOSIS — J8410 Pulmonary fibrosis, unspecified: Secondary | ICD-10-CM | POA: Diagnosis not present

## 2021-06-15 DIAGNOSIS — I110 Hypertensive heart disease with heart failure: Secondary | ICD-10-CM | POA: Diagnosis not present

## 2021-06-15 DIAGNOSIS — E1165 Type 2 diabetes mellitus with hyperglycemia: Secondary | ICD-10-CM | POA: Diagnosis not present

## 2021-06-15 DIAGNOSIS — E039 Hypothyroidism, unspecified: Secondary | ICD-10-CM | POA: Diagnosis not present

## 2021-06-15 DIAGNOSIS — I480 Paroxysmal atrial fibrillation: Secondary | ICD-10-CM | POA: Diagnosis not present

## 2021-06-15 DIAGNOSIS — I083 Combined rheumatic disorders of mitral, aortic and tricuspid valves: Secondary | ICD-10-CM | POA: Diagnosis not present

## 2021-06-15 DIAGNOSIS — E785 Hyperlipidemia, unspecified: Secondary | ICD-10-CM | POA: Diagnosis not present

## 2021-06-15 DIAGNOSIS — I5023 Acute on chronic systolic (congestive) heart failure: Secondary | ICD-10-CM | POA: Diagnosis not present

## 2021-06-16 DIAGNOSIS — J9621 Acute and chronic respiratory failure with hypoxia: Secondary | ICD-10-CM | POA: Diagnosis not present

## 2021-06-16 DIAGNOSIS — E1165 Type 2 diabetes mellitus with hyperglycemia: Secondary | ICD-10-CM | POA: Diagnosis not present

## 2021-06-16 DIAGNOSIS — I110 Hypertensive heart disease with heart failure: Secondary | ICD-10-CM | POA: Diagnosis not present

## 2021-06-16 DIAGNOSIS — I480 Paroxysmal atrial fibrillation: Secondary | ICD-10-CM | POA: Diagnosis not present

## 2021-06-16 DIAGNOSIS — J8410 Pulmonary fibrosis, unspecified: Secondary | ICD-10-CM | POA: Diagnosis not present

## 2021-06-16 DIAGNOSIS — I083 Combined rheumatic disorders of mitral, aortic and tricuspid valves: Secondary | ICD-10-CM | POA: Diagnosis not present

## 2021-06-16 DIAGNOSIS — E785 Hyperlipidemia, unspecified: Secondary | ICD-10-CM | POA: Diagnosis not present

## 2021-06-16 DIAGNOSIS — I5023 Acute on chronic systolic (congestive) heart failure: Secondary | ICD-10-CM | POA: Diagnosis not present

## 2021-06-16 DIAGNOSIS — E039 Hypothyroidism, unspecified: Secondary | ICD-10-CM | POA: Diagnosis not present

## 2021-06-23 DIAGNOSIS — E785 Hyperlipidemia, unspecified: Secondary | ICD-10-CM | POA: Diagnosis not present

## 2021-06-23 DIAGNOSIS — J9621 Acute and chronic respiratory failure with hypoxia: Secondary | ICD-10-CM | POA: Diagnosis not present

## 2021-06-23 DIAGNOSIS — I083 Combined rheumatic disorders of mitral, aortic and tricuspid valves: Secondary | ICD-10-CM | POA: Diagnosis not present

## 2021-06-23 DIAGNOSIS — I5023 Acute on chronic systolic (congestive) heart failure: Secondary | ICD-10-CM | POA: Diagnosis not present

## 2021-06-23 DIAGNOSIS — E039 Hypothyroidism, unspecified: Secondary | ICD-10-CM | POA: Diagnosis not present

## 2021-06-23 DIAGNOSIS — I110 Hypertensive heart disease with heart failure: Secondary | ICD-10-CM | POA: Diagnosis not present

## 2021-06-23 DIAGNOSIS — J8410 Pulmonary fibrosis, unspecified: Secondary | ICD-10-CM | POA: Diagnosis not present

## 2021-06-23 DIAGNOSIS — I480 Paroxysmal atrial fibrillation: Secondary | ICD-10-CM | POA: Diagnosis not present

## 2021-06-23 DIAGNOSIS — E1165 Type 2 diabetes mellitus with hyperglycemia: Secondary | ICD-10-CM | POA: Diagnosis not present

## 2021-06-24 ENCOUNTER — Telehealth: Payer: Self-pay | Admitting: Internal Medicine

## 2021-06-24 DIAGNOSIS — F17210 Nicotine dependence, cigarettes, uncomplicated: Secondary | ICD-10-CM | POA: Diagnosis not present

## 2021-06-24 DIAGNOSIS — J8410 Pulmonary fibrosis, unspecified: Secondary | ICD-10-CM | POA: Diagnosis not present

## 2021-06-24 DIAGNOSIS — Z6829 Body mass index (BMI) 29.0-29.9, adult: Secondary | ICD-10-CM | POA: Diagnosis not present

## 2021-06-24 DIAGNOSIS — K149 Disease of tongue, unspecified: Secondary | ICD-10-CM | POA: Diagnosis not present

## 2021-06-24 DIAGNOSIS — Z794 Long term (current) use of insulin: Secondary | ICD-10-CM | POA: Diagnosis not present

## 2021-06-24 DIAGNOSIS — I110 Hypertensive heart disease with heart failure: Secondary | ICD-10-CM | POA: Diagnosis not present

## 2021-06-24 DIAGNOSIS — E1169 Type 2 diabetes mellitus with other specified complication: Secondary | ICD-10-CM | POA: Diagnosis not present

## 2021-06-24 DIAGNOSIS — E1165 Type 2 diabetes mellitus with hyperglycemia: Secondary | ICD-10-CM | POA: Diagnosis not present

## 2021-06-24 DIAGNOSIS — I480 Paroxysmal atrial fibrillation: Secondary | ICD-10-CM | POA: Diagnosis not present

## 2021-06-24 DIAGNOSIS — I5023 Acute on chronic systolic (congestive) heart failure: Secondary | ICD-10-CM | POA: Diagnosis not present

## 2021-06-24 DIAGNOSIS — E785 Hyperlipidemia, unspecified: Secondary | ICD-10-CM | POA: Diagnosis not present

## 2021-06-24 DIAGNOSIS — E039 Hypothyroidism, unspecified: Secondary | ICD-10-CM | POA: Diagnosis not present

## 2021-06-24 DIAGNOSIS — J9621 Acute and chronic respiratory failure with hypoxia: Secondary | ICD-10-CM | POA: Diagnosis not present

## 2021-06-24 DIAGNOSIS — I083 Combined rheumatic disorders of mitral, aortic and tricuspid valves: Secondary | ICD-10-CM | POA: Diagnosis not present

## 2021-06-24 NOTE — Telephone Encounter (Signed)
[facility] but she did not answer. Left message for her to call back.

## 2021-06-25 DIAGNOSIS — R0602 Shortness of breath: Secondary | ICD-10-CM | POA: Diagnosis not present

## 2021-06-25 DIAGNOSIS — U071 COVID-19: Secondary | ICD-10-CM | POA: Diagnosis not present

## 2021-06-26 NOTE — Progress Notes (Deleted)
Cardiology Office Note:    Date:  07/01/2021   ID:  BRAYLON LEMMONS, DOB Feb 06, 1941, MRN [id number]  PCP:  Kathyrn Lass, MD  Cardiologist:  [name], MD  Electrophysiologist:  None   Referring MD: Kathyrn Lass, MD   No chief complaint on file.   History of Present Illness:    Elizabeth Melton is a 80 y.o. female with a hx of submassive PE, interstitial lung disease, diabetes, hyperlipidemia, HFpEF who presents for follow-up.  She was admitted on 01/16/2021 with COVID-19 pneumonia.  Course complicated by development of A. fib with RVR.  She was started on amiodarone drip and converted to sinus rhythm.  Amiodarone was discontinued given elevated liver enzymes.  She was discharged on Eliquis and Toprol-XL 25 mg daily.  She was noted while on metoprolol and amiodarone to have bradycardia.  Echocardiogram on 01/22/2021 showed LVEF 40 to 45%, severe biatrial dilatation, mildly reduced RV function, moderate MR, mild to moderate TR, moderate AI.  She was discharged on Toprol-XL 25 mg daily and losartan 12.5 mg daily.  At follow-up clinic visit May 2022, noted SPO2 initially less than 70%.  She reports she went shopping tand was without her oxygen.  She appeared dyspneic and was strongly recommended that she go to the ED but she declined.  She was started on oxygen and SPO2 gradually improved to high 80s/low 90s.  Her dyspnea improved.  States that she has quit smoking.  She was hospitalized from 6/8 through 05/22/2021 at [facility] for acute on chronic hypoxic respiratory failure.  She improved with IV diuresis as well as steroids.  Repeat echocardiogram 05/30/2021 showed EF 60 to 25%, grade 2 diastolic dysfunction, RVSP 63, moderate MR, moderate TR.  Since last clinic visit, She denies any recent chest pain.  Reports he continues to short of breath with minimal exertion.  She is taking Lasix about 3 times per week.  Has been taking Eliquis, reports intermittent epistaxis but otherwise  denies any bleeding issues.    Wt Readings from Last 3 Encounters:  07/01/21 166 lb 3.2 oz (75.4 kg)  06/02/21 164 lb (74.4 kg)  05/22/21 169 lb 15.6 oz (77.1 kg)     Past Medical History:  Diagnosis Date   Acute on chronic systolic (congestive) heart failure ([place]) 05/19/2021   Diabetes mellitus without complication (HCC)    Hyperlipidemia    Hypertension    Hypothyroidism    Thrombocytopenia (HCC)     Past Surgical History:  Procedure Laterality Date   TUBAL LIGATION      Current Medications: Current Meds  Medication Sig   acetaminophen (TYLENOL) 500 MG tablet Take 500 mg by mouth daily as needed for headache (pain).    apixaban (ELIQUIS) 5 MG TABS tablet Take 1 tablet (5 mg total) by mouth 2 (two) times daily.   b complex vitamins tablet Take 1 tablet by mouth daily.   Blood Glucose Monitoring Suppl (TRUE METRIX METER) w/Device KIT 1 each by Other route in the morning and at bedtime.   Calcium Carb-Cholecalciferol (CALCIUM + D3) 600-200 MG-UNIT TABS Take 2 tablets by mouth daily.    cholecalciferol (VITAMIN D3) 25 MCG (1000 UT) tablet Take 1,000 Units by mouth daily.   glucose blood test strip 1 each by Other route as needed for other (blood sugar).   insulin aspart (NOVOLOG) 100 UNIT/ML FlexPen Insulin sliding scale: Blood sugar  120-150   3units  151-200   4units                       201-250   7units                       251- 300  11units                       301-350   15uints                       351-400   20units                       >400         call MD immediately   Insulin Pen Needle (PEN NEEDLES 29GX1/2") 29G X 12MM MISC For insulin injection   levothyroxine (SYNTHROID, LEVOTHROID) 75 MCG tablet Take 75 mcg by mouth daily before breakfast.    lovastatin (MEVACOR) 40 MG tablet Take 40 mg by mouth at bedtime.   metoprolol succinate (TOPROL-XL) 25 MG 24 hr tablet Take 0.5 tablets (12.5 mg total) by mouth daily.   nicotine (NICODERM CQ -  DOSED IN MG/24 HOURS) 21 mg/24hr patch Place 21 mg onto the skin daily.   Omega-3 Fatty Acids (FISH OIL PO) Take 1 capsule by mouth daily.   potassium chloride (KLOR-CON) 10 MEQ tablet Take 1 tablet (10 mEq total) by mouth daily.   predniSONE (DELTASONE) 10 MG tablet Take 2 tab 20 mg daily for 7 days, then take 10 mg daily.   senna-docusate (SENOKOT-S) 8.6-50 MG tablet Take 1 tablet by mouth at bedtime as needed for mild constipation.   SMART SENSE THIN LANCETS 26G MISC 1 each by Does not apply route 2 (two) times daily.   Tiotropium Bromide-Olodaterol (STIOLTO RESPIMAT) 2.5-2.5 MCG/ACT AERS Inhale 2 puffs into the lungs daily.     Allergies:   Patient has no known allergies.   Social History   Socioeconomic History   Marital status: Married    Spouse name: Not on file   Number of children: Not on file   Years of education: Not on file   Highest education level: Not on file  Occupational History   Not on file  Tobacco Use   Smoking status: Former    Packs/day: 0.75    Years: 60.00    Pack years: 45.00    Types: Cigarettes    Quit date: 05/11/2021    Years since quitting: 0.1   Smokeless tobacco: Former    Quit date: 09/24/1962  Vaping Use   Vaping Use: Never used  Substance and Sexual Activity   Alcohol use: Yes   Drug use: No   Sexual activity: Not on file  Other Topics Concern   Not on file  Social History Narrative   Not on file   Social Determinants of Health   Financial Resource Strain: Not on file  Food Insecurity: Not on file  Transportation Needs: Not on file  Physical Activity: Not on file  Stress: Not on file  Social Connections: Not on file     Family History: The patient's family history includes Diabetes in her mother; Hyperlipidemia in her sister; Hypertension in her mother and sister; Kidney disease in her mother; Other in her father.  ROS:   Please see the history of present illness.     All other systems reviewed and are  negative.  EKGs/Labs/Other Studies Reviewed:    The following studies were reviewed today:   EKG:  EKG is ordered today.  The ekg ordered today demonstrates normal sinus rhythm, rate 82, nonspecific T wave flattening  Recent Labs: 05/18/2021: B Natriuretic Peptide 509.8 05/19/2021: ALT 17; Hemoglobin 14.3; Platelets 152; TSH 2.028 05/22/2021: BUN 31; Creatinine, Ser 0.93; Magnesium 2.4; Potassium 4.1; Sodium 138  Recent Lipid Panel    Component Value Date/Time   CHOL 105 01/21/2021 0242   TRIG 77 01/21/2021 0242   HDL 28 (L) 01/21/2021 0242   CHOLHDL 3.8 01/21/2021 0242   VLDL 15 01/21/2021 0242   LDLCALC 62 01/21/2021 0242    Physical Exam:    VS:  BP (!) 132/52   Pulse 79   Ht 5' 6" (1.676 m)   Wt 166 lb 3.2 oz (75.4 kg)   BMI 26.83 kg/m     Wt Readings from Last 3 Encounters:  07/01/21 166 lb 3.2 oz (75.4 kg)  06/02/21 164 lb (74.4 kg)  05/22/21 169 lb 15.6 oz (77.1 kg)     GEN: in no acute distress HEENT: Normal NECK: + JVD; No carotid bruits LYMPHATICS: No lymphadenopathy CARDIAC: RRR, no murmurs, rubs, gallops RESPIRATORY:  Bibasilar crackles ABDOMEN: Soft, non-tender, non-distended MUSCULOSKELETAL:  No edema; No deformity  SKIN: Warm and dry NEUROLOGIC:  Alert and oriented x 3 PSYCHIATRIC:  Normal affect   ASSESSMENT:    No diagnosis found.  PLAN:     Chronic combined systolic and diastolic heart failure: Echocardiogram on 01/22/2021 showed LVEF 40 to 45%, severe biatrial dilatation, mildly reduced RV function, moderate MR, mild to moderate TR, moderate AI.  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  Repeat echocardiogram 05/30/2021 showed EF 60 to 79%, grade 2 diastolic dysfunction, RVSP 63, moderate MR, moderate TR. -She was initially started on amiodarone during hospitalization but was discontinued due to transaminitis.  Amiodarone is likely not a good long-term choice for her given her pulmonary issues as above.  She  does appear to be maintaining sinus rhythm.   -Continue Toprol-XL 12.5 mg daily -Continue Lasix 40 mg daily.  Check BMP/magnesium  Pulmonary hypertension: Suspect class 3 in setting of interstitial lung disease as well as class 2 from chronic combined systolic/diastolic heart failure. -Discussed with Dr [name], may benefit from Tyvaso but needs right heart catheterization, will arrange  Chronic respiratory failure with hypoxia: Hypoxia is likely multifactorial in setting of interstitial lung disease and chronic combined systolic and diastolic heart failure -Follow with Dr [name] in pulmonology  Paroxysmal atrial fibrillation: Diagnosed during admission with COVID-19 pneumonia in February 2022.  Converted to sinus rhythm with amiodarone, which was discontinued due to elevated liver enzymes -Appears to be maintaining sinus rhythm -Continue Toprol-XL 12.5 mg daily -Continue Eliquis 5 mg twice daily  RTC in ***  Medication Adjustments/Labs and Tests Ordered: Current medicines are reviewed at length with the patient today.  Concerns regarding medicines are outlined above.  No orders of the defined types were placed in this encounter.  No orders of the defined types were placed in this encounter.   There are no Patient Instructions on file for this visit.   Signed, [name], MD  07/01/2021 11:35 AM    [place]

## 2021-06-28 DIAGNOSIS — I480 Paroxysmal atrial fibrillation: Secondary | ICD-10-CM | POA: Diagnosis not present

## 2021-06-28 DIAGNOSIS — I110 Hypertensive heart disease with heart failure: Secondary | ICD-10-CM | POA: Diagnosis not present

## 2021-06-28 DIAGNOSIS — I083 Combined rheumatic disorders of mitral, aortic and tricuspid valves: Secondary | ICD-10-CM | POA: Diagnosis not present

## 2021-06-28 DIAGNOSIS — J9621 Acute and chronic respiratory failure with hypoxia: Secondary | ICD-10-CM | POA: Diagnosis not present

## 2021-06-28 DIAGNOSIS — I5023 Acute on chronic systolic (congestive) heart failure: Secondary | ICD-10-CM | POA: Diagnosis not present

## 2021-06-28 DIAGNOSIS — E785 Hyperlipidemia, unspecified: Secondary | ICD-10-CM | POA: Diagnosis not present

## 2021-06-28 DIAGNOSIS — E1165 Type 2 diabetes mellitus with hyperglycemia: Secondary | ICD-10-CM | POA: Diagnosis not present

## 2021-06-28 DIAGNOSIS — E039 Hypothyroidism, unspecified: Secondary | ICD-10-CM | POA: Diagnosis not present

## 2021-06-28 DIAGNOSIS — J8410 Pulmonary fibrosis, unspecified: Secondary | ICD-10-CM | POA: Diagnosis not present

## 2021-06-28 MED ORDER — PREDNISONE 10 MG PO TABS: ORAL_TABLET | ORAL | 1 refills | Status: DC

## 2021-06-28 NOTE — Telephone Encounter (Signed)
Called and spoke with the pt and made her aware of response per Dr Silas Flood. She verbalized understanding. She is scheduled with MR with PFT same day 8/18 and there was nothing sooner so will keep these and call sooner of not improving in a few days.

## 2021-06-28 NOTE — Telephone Encounter (Signed)
Spoke with Beth who provided update on pt. Pt's O2 sats drop to below 90% with 3L after any excretion. Today pt dropped to 80% after walking 20 ft, it took pt 8 mins to recover to 93%. Beth states pt did c/o being dizzy at times. Beth states she can hear rhonchi in lower R lung base and pt has c/o increased productive cough. Beth states all symptoms seemed to be uncontrol when pt was on steroids but began to flare once pt came off. Dr. Silas Flood can you please advise since Dr. [name] is not available?

## 2021-06-28 NOTE — Telephone Encounter (Signed)
Called and spoke to [place] with [facility]. She states last week the pt was being evaluated for a portable concentrator and when pt was walking her sats would go into the 80s and was difficult to get her above 90% even on O2. Beth stated the pt's lungs sounded "junkie". Called and spoke to the pt. She states she is feeling ok. Pt states she feels better than last week and is only c/o chronic prod cough with clear mucus and intermittent dizziness that she is contributing to her fluid pill. Pt states overall she feels ok. Beth states she is visiting the pt today around noon and will call back with an update on pt's status. Pt aware to call back or seek emergency care if any worsening s/s.   Will await Beths call.

## 2021-06-28 NOTE — Telephone Encounter (Signed)
Prednisone 20 mg for 7 days, then 10 mg until f/u with Dr. [name]. 2 month supply prescribed. Please schedule f/u with Dr. [name] next available. If not getting better or worsens in next few days she needs to be seen in office.

## 2021-06-30 NOTE — Progress Notes (Signed)
Cardiology Office Note:    Date:  07/03/2021   ID:  Elizabeth Melton, DOB 08/08/1941, MRN [id number]  PCP:  Kathyrn Lass, MD  Cardiologist:  [name], MD  Electrophysiologist:  None   Referring MD: Kathyrn Lass, MD   No chief complaint on file.   History of Present Illness:    Elizabeth Melton is a 80 y.o. female with a hx of submassive PE, interstitial lung disease, diabetes, hyperlipidemia, HFpEF who presents for follow-up.  She was admitted on 01/16/2021 with COVID-19 pneumonia.  Course complicated by development of A. fib with RVR.  She was started on amiodarone drip and converted to sinus rhythm.  Amiodarone was discontinued given elevated liver enzymes.  She was discharged on Eliquis and Toprol-XL 25 mg daily.  She was noted while on metoprolol and amiodarone to have bradycardia.  Echocardiogram on 01/22/2021 showed LVEF 40 to 45%, severe biatrial dilatation, mildly reduced RV function, moderate MR, mild to moderate TR, moderate AI.  She was discharged on Toprol-XL 25 mg daily and losartan 12.5 mg daily.  At follow-up clinic visit May 2022, noted SPO2 initially less than 70%.  She reports she went shopping tand was without her oxygen.  She appeared dyspneic and was strongly recommended that she go to the ED but she declined.  She was started on oxygen and SPO2 gradually improved to high 80s/low 90s.  Her dyspnea improved.  States that she has quit smoking.  She was hospitalized from 6/8 through 05/22/2021 at [facility] for acute on chronic hypoxic respiratory failure.  She improved with IV diuresis as well as steroids.  Repeat echocardiogram 05/30/2021 showed EF 60 to 08%, grade 2 diastolic dysfunction, RVSP 63, moderate MR, moderate TR.   Since last clinic visit, she has occasional shortness of breath and brief chest pains. She has chest pains on the right side of her chest and feels tight. She experiences lightheadedness at rest. She also has cramps in her hip and legs. She  has soreness in her left leg. Denies syncope or palpitations. Her SpO2 ranges 91%-92% at home. She takes her insulin pen needle and Furosemide 40 MG regularly however she may miss one dose a week with Furosemide.   Wt Readings from Last 3 Encounters:  07/01/21 166 lb 3.2 oz (75.4 kg)  06/02/21 164 lb (74.4 kg)  05/22/21 169 lb 15.6 oz (77.1 kg)     Past Medical History:  Diagnosis Date   Acute on chronic systolic (congestive) heart failure ([place]) 05/19/2021   Diabetes mellitus without complication (HCC)    Hyperlipidemia    Hypertension    Hypothyroidism    Thrombocytopenia (HCC)     Past Surgical History:  Procedure Laterality Date   TUBAL LIGATION      Current Medications: Current Meds  Medication Sig   acetaminophen (TYLENOL) 500 MG tablet Take 500 mg by mouth daily as needed for headache (pain).    apixaban (ELIQUIS) 5 MG TABS tablet Take 1 tablet (5 mg total) by mouth 2 (two) times daily.   b complex vitamins tablet Take 1 tablet by mouth daily.   Blood Glucose Monitoring Suppl (TRUE METRIX METER) w/Device KIT 1 each by Other route in the morning and at bedtime.   Calcium Carb-Cholecalciferol (CALCIUM + D3) 600-200 MG-UNIT TABS Take 2 tablets by mouth daily.    cholecalciferol (VITAMIN D3) 25 MCG (1000 UT) tablet Take 1,000 Units by mouth daily.   glucose blood test strip 1 each by Other route as  needed for other (blood sugar).   insulin aspart (NOVOLOG) 100 UNIT/ML FlexPen Insulin sliding scale: Blood sugar  120-150   3units                       151-200   4units                       201-250   7units                       251- 300  11units                       301-350   15uints                       351-400   20units                       >400         call MD immediately   Insulin Pen Needle (PEN NEEDLES 29GX1/2") 29G X 12MM MISC For insulin injection   levothyroxine (SYNTHROID, LEVOTHROID) 75 MCG tablet Take 75 mcg by mouth daily before breakfast.    lovastatin  (MEVACOR) 40 MG tablet Take 40 mg by mouth at bedtime.   metoprolol succinate (TOPROL-XL) 25 MG 24 hr tablet Take 0.5 tablets (12.5 mg total) by mouth daily.   nicotine (NICODERM CQ - DOSED IN MG/24 HOURS) 21 mg/24hr patch Place 21 mg onto the skin daily.   Omega-3 Fatty Acids (FISH OIL PO) Take 1 capsule by mouth daily.   potassium chloride (KLOR-CON) 10 MEQ tablet Take 1 tablet (10 mEq total) by mouth daily.   predniSONE (DELTASONE) 10 MG tablet Take 2 tab 20 mg daily for 7 days, then take 10 mg daily.   senna-docusate (SENOKOT-S) 8.6-50 MG tablet Take 1 tablet by mouth at bedtime as needed for mild constipation.   SMART SENSE THIN LANCETS 26G MISC 1 each by Does not apply route 2 (two) times daily.   Tiotropium Bromide-Olodaterol (STIOLTO RESPIMAT) 2.5-2.5 MCG/ACT AERS Inhale 2 puffs into the lungs daily.     Allergies:   Patient has no known allergies.   Social History   Socioeconomic History   Marital status: Widowed    Spouse name: Not on file   Number of children: Not on file   Years of education: Not on file   Highest education level: Not on file  Occupational History   Not on file  Tobacco Use   Smoking status: Former    Packs/day: 0.75    Years: 60.00    Pack years: 45.00    Types: Cigarettes    Quit date: 05/11/2021    Years since quitting: 0.1   Smokeless tobacco: Former    Quit date: 09/24/1962  Vaping Use   Vaping Use: Never used  Substance and Sexual Activity   Alcohol use: Yes   Drug use: No   Sexual activity: Not on file  Other Topics Concern   Not on file  Social History Narrative   Not on file   Social Determinants of Health   Financial Resource Strain: Not on file  Food Insecurity: Not on file  Transportation Needs: Not on file  Physical Activity: Not on file  Stress: Not on file  Social Connections: Not on file     Family History: The patient's family history includes  Diabetes in her mother; Hyperlipidemia in her sister; Hypertension in her  mother and sister; Kidney disease in her mother; Other in her father.  ROS:   Please see the history of present illness.     (+) chest pain, located right side of chest  (+) shortness of breath  (+) leg and hip cramping  (+) lightheadedness All other systems reviewed and are negative.  EKGs/Labs/Other Studies Reviewed:    The following studies were reviewed today:  Echo 6/22:   IMPRESSIONS:   1. Left ventricular ejection fraction, by estimation, is 60 to 65%. The  left ventricle has normal function. The left ventricle has no regional  wall motion abnormalities. Left ventricular diastolic parameters are  consistent with Grade II diastolic  dysfunction (pseudonormalization). Elevated left ventricular end-diastolic  pressure.   2. Right ventricular systolic function is low normal. The right  ventricular size is mildly enlarged. There is severely elevated pulmonary  artery systolic pressure. The estimated right ventricular systolic  pressure is 15.4 mmHg.   3. Left atrial size was moderately dilated.   4. Right atrial size was mild to moderately dilated.   5. The mitral valve is degenerative. Moderate mitral valve regurgitation.  No evidence of mitral stenosis. Moderate mitral annular calcification.   6. Tricuspid valve regurgitation is moderate.   7. The aortic valve is calcified. There is moderate calcification of the  aortic valve. There is moderate thickening of the aortic valve. Aortic  valve regurgitation is mild to moderate. Mild aortic valve stenosis.   8. Aortic dilatation noted. There is borderline dilatation of the  ascending aorta, measuring 38 mm.   Comparison(s): No significant change from prior study.  Echo 02/22  IMPRESSIONS    1. Left ventricular ejection fraction, by estimation, is 40 to 45%. The  left ventricle has mildly decreased function. The left ventricle  demonstrates global hypokinesis. There is mild concentric left ventricular  hypertrophy.  Diastolic function is  indeterminant due to Afib.   2. Right ventricular systolic function is mildly reduced. The right  ventricular size is normal. There is mildly elevated pulmonary artery  systolic pressure.   3. Left atrial size was severely dilated.   4. Right atrial size was severely dilated.   5. The mitral valve is abnormal. Moderate mitral valve regurgitation.  Moderate mitral annular calcification.   6. Tricuspid valve regurgitation is mild to moderate.   7. The aortic valve is tricuspid. There is moderate calcification of the  aortic valve. There is moderate thickening of the aortic valve. Aortic  valve regurgitation is moderate. Mild aortic valve stenosis.   8. The inferior vena cava is dilated in size with <50% respiratory  variability, suggesting right atrial pressure of 15 mmHg.   Comparison(s): Compared to prior TTE, the LVEF appears depressed to  40-45%, there is now moderate MR and AI. Patient is also in Afib currently  with rapid ventricular response.   Echo 01/22  IMPRESSIONS:   1. Left ventricular ejection fraction, by estimation, is 60 to 65%. The  left ventricle has normal function. The left ventricle has no regional  wall motion abnormalities. There is mild left ventricular hypertrophy.  Left ventricular diastolic parameters  are consistent with Grade II diastolic dysfunction (pseudonormalization).  Elevated left ventricular end-diastolic pressure. The E/e' is 24.   2. Right ventricular systolic function is normal. The right ventricular  size is normal. There is severely elevated pulmonary artery systolic  pressure. The estimated right ventricular systolic pressure is 00.8 mmHg.  3. Left atrial size was severely dilated.   4. Right atrial size was mildly dilated.   5. The mitral valve is grossly normal. Trivial mitral valve  regurgitation.   6. The aortic valve is tricuspid. Aortic valve regurgitation is mild.  Mild aortic valve stenosis. Aortic valve  area, by VTI measures 1.71 cm.  Aortic valve mean gradient measures 8.0 mmHg. Aortic valve Vmax measures  2.17 m/s.   7. The inferior vena cava is normal in size with greater than 50%  respiratory variability, suggesting right atrial pressure of 3 mmHg.   Comparison(s): Changes from prior study are noted. 09/06/20: LVEF 60-65%,  RVSP 70 mmHG, severely dilated and hypokinetic RV. In my review of the  previous study, the RV does not appear significantly dilated or  hypokinetic.   EKG:   07/22: no EKG was ordered today 05/22:normal sinus rhythm, rate 82, nonspecific T wave flattening  Recent Labs: 05/18/2021: B Natriuretic Peptide 509.8 05/19/2021: ALT 17; TSH 2.028 07/01/2021: BUN 15; Creatinine, Ser 0.84; Hemoglobin 15.6; Magnesium 2.5; Platelets 133; Potassium 5.0; Sodium 142  Recent Lipid Panel    Component Value Date/Time   CHOL 105 01/21/2021 0242   TRIG 77 01/21/2021 0242   HDL 28 (L) 01/21/2021 0242   CHOLHDL 3.8 01/21/2021 0242   VLDL 15 01/21/2021 0242   LDLCALC 62 01/21/2021 0242    Physical Exam:    VS:  BP (!) 132/52   Pulse 79   Ht _0  (1.676 m)   Wt 166 lb 3.2 oz (75.4 kg)   BMI 26.83 kg/m     Wt Readings from Last 3 Encounters:  07/01/21 166 lb 3.2 oz (75.4 kg)  06/02/21 164 lb (74.4 kg)  05/22/21 169 lb 15.6 oz (77.1 kg)     GEN: in no acute distress HEENT: Normal NECK: No JVD; No carotid bruits LYMPHATICS: No lymphadenopathy CARDIAC: RRR, no murmurs, rubs, gallops RESPIRATORY:  CTAB ABDOMEN: Soft, non-tender, non-distended MUSCULOSKELETAL:  No edema; No deformity  SKIN: Warm and dry NEUROLOGIC:  Alert and oriented x 3 PSYCHIATRIC:  Normal affect   ASSESSMENT:    1. Pulmonary hypertension ([place])   2. PAF (paroxysmal atrial fibrillation) ([place])   3. Chronic combined systolic (congestive) and diastolic (congestive) heart failure ([place])   4. Chronic respiratory failure with hypoxia (HCC)     PLAN:     Chronic combined systolic and diastolic  heart failure: Echocardiogram on 01/22/2021 showed LVEF 40 to 45%, severe biatrial dilatation, mildly reduced RV function, moderate MR, mild to moderate TR, moderate AI.  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  Repeat echocardiogram 05/30/2021 showed EF 60 to 87%, grade 2 diastolic dysfunction, RVSP 63, moderate MR, moderate TR. -She was initially started on amiodarone during hospitalization but was discontinued due to transaminitis.  Amiodarone is likely not a good long-term choice for her given her pulmonary issues as above.  She does appear to be maintaining sinus rhythm.   -Continue Toprol-XL 12.5 mg daily -Continue Lasix 40 mg daily.  Appears euvolemic.  Check BMP/magnesium  Pulmonary hypertension: Suspect class III in setting of interstitial lung disease as well as class II from chronic combined systolic/diastolic heart failure. -Discussed with Dr [name], may benefit from Tyvaso but needs right heart catheterization, will arrange  Chronic respiratory failure with hypoxia: Hypoxia is likely multifactorial in setting of interstitial lung disease and chronic combined systolic and diastolic heart failure.  -Follow with Dr [name] in pulmonology  Paroxysmal atrial fibrillation: Diagnosed during admission  with COVID-19 pneumonia in February 2022.  Converted to sinus rhythm with amiodarone, which was discontinued due to elevated liver enzymes -Appears to be maintaining sinus rhythm -Continue Toprol-XL 12.5 mg daily -Continue Eliquis 5 mg twice daily  RTC in 3 months  Medication Adjustments/Labs and Tests Ordered: Current medicines are reviewed at length with the patient today.  Concerns regarding medicines are outlined above.  Orders Placed This Encounter  Procedures   Basic metabolic panel   CBC   Magnesium    No orders of the defined types were placed in this encounter.   Patient Instructions  Medication Instructions:  Your physician  recommends that you continue on your current medications as directed. Please refer to the Current Medication list given to you today.  *If you need a refill on your cardiac medications before your next appointment, please call your pharmacy*   Lab Work: BMET, CBC, Mag today  If you have labs (blood work) drawn today and your tests are completely normal, you will receive your results only by: [place] (if you have MyChart) OR A paper copy in the mail If you have any lab test that is abnormal or we need to change your treatment, we will call you to review the results.  Follow-Up: At [facility], you and your health needs are our priority.  As part of our continuing mission to provide you with exceptional heart care, we have created designated Provider Care Teams.  These Care Teams include your primary Cardiologist (physician) and Advanced Practice Providers (APPs -  Physician Assistants and Nurse Practitioners) who all work together to provide you with the care you need, when you need it.  We recommend signing up for the patient portal called "MyChart".  Sign up information is provided on this After Visit Summary.  MyChart is used to connect with patients for Virtual Visits (Telemedicine).  Patients are able to view lab/test results, encounter notes, upcoming appointments, etc.  Non-urgent messages can be sent to your provider as well.   To learn more about what you can do with MyChart, go to [email or web address].    Your next appointment:   3 month(s)  The format for your next appointment:   In Person  Provider:   Oswaldo Milian, MD    Ardell Isaacs as a scribe for [name], MD.,have documented all relevant documentation on the behalf of [name], MD,as directed by  [name], MD while in the presence of [name], MD.  I, [name], MD, have reviewed all documentation for this visit. The  documentation on 07/03/21 for the exam, diagnosis, procedures, and orders are all accurate and complete.  Signed, [name], MD  07/03/2021 10:45 PM    [facility]

## 2021-06-30 NOTE — H&P (View-Only) (Signed)
Cardiology Office Note:    Date:  07/03/2021   ID:  Elizabeth Melton, DOB 08/08/1941, MRN [id number]  PCP:  Kathyrn Lass, MD  Cardiologist:  [name], MD  Electrophysiologist:  None   Referring MD: Kathyrn Lass, MD   No chief complaint on file.   History of Present Illness:    Elizabeth Melton is a 80 y.o. female with a hx of submassive PE, interstitial lung disease, diabetes, hyperlipidemia, HFpEF who presents for follow-up.  She was admitted on 01/16/2021 with COVID-19 pneumonia.  Course complicated by development of A. fib with RVR.  She was started on amiodarone drip and converted to sinus rhythm.  Amiodarone was discontinued given elevated liver enzymes.  She was discharged on Eliquis and Toprol-XL 25 mg daily.  She was noted while on metoprolol and amiodarone to have bradycardia.  Echocardiogram on 01/22/2021 showed LVEF 40 to 45%, severe biatrial dilatation, mildly reduced RV function, moderate MR, mild to moderate TR, moderate AI.  She was discharged on Toprol-XL 25 mg daily and losartan 12.5 mg daily.  At follow-up clinic visit May 2022, noted SPO2 initially less than 70%.  She reports she went shopping tand was without her oxygen.  She appeared dyspneic and was strongly recommended that she go to the ED but she declined.  She was started on oxygen and SPO2 gradually improved to high 80s/low 90s.  Her dyspnea improved.  States that she has quit smoking.  She was hospitalized from 6/8 through 05/22/2021 at [facility] for acute on chronic hypoxic respiratory failure.  She improved with IV diuresis as well as steroids.  Repeat echocardiogram 05/30/2021 showed EF 60 to 08%, grade 2 diastolic dysfunction, RVSP 63, moderate MR, moderate TR.   Since last clinic visit, she has occasional shortness of breath and brief chest pains. She has chest pains on the right side of her chest and feels tight. She experiences lightheadedness at rest. She also has cramps in her hip and legs. She  has soreness in her left leg. Denies syncope or palpitations. Her SpO2 ranges 91%-92% at home. She takes her insulin pen needle and Furosemide 40 MG regularly however she may miss one dose a week with Furosemide.   Wt Readings from Last 3 Encounters:  07/01/21 166 lb 3.2 oz (75.4 kg)  06/02/21 164 lb (74.4 kg)  05/22/21 169 lb 15.6 oz (77.1 kg)     Past Medical History:  Diagnosis Date   Acute on chronic systolic (congestive) heart failure ([place]) 05/19/2021   Diabetes mellitus without complication (HCC)    Hyperlipidemia    Hypertension    Hypothyroidism    Thrombocytopenia (HCC)     Past Surgical History:  Procedure Laterality Date   TUBAL LIGATION      Current Medications: Current Meds  Medication Sig   acetaminophen (TYLENOL) 500 MG tablet Take 500 mg by mouth daily as needed for headache (pain).    apixaban (ELIQUIS) 5 MG TABS tablet Take 1 tablet (5 mg total) by mouth 2 (two) times daily.   b complex vitamins tablet Take 1 tablet by mouth daily.   Blood Glucose Monitoring Suppl (TRUE METRIX METER) w/Device KIT 1 each by Other route in the morning and at bedtime.   Calcium Carb-Cholecalciferol (CALCIUM + D3) 600-200 MG-UNIT TABS Take 2 tablets by mouth daily.    cholecalciferol (VITAMIN D3) 25 MCG (1000 UT) tablet Take 1,000 Units by mouth daily.   glucose blood test strip 1 each by Other route as  needed for other (blood sugar).   insulin aspart (NOVOLOG) 100 UNIT/ML FlexPen Insulin sliding scale: Blood sugar  120-150   3units                       151-200   4units                       201-250   7units                       251- 300  11units                       301-350   15uints                       351-400   20units                       >400         call MD immediately   Insulin Pen Needle (PEN NEEDLES 29GX1/2") 29G X 12MM MISC For insulin injection   levothyroxine (SYNTHROID, LEVOTHROID) 75 MCG tablet Take 75 mcg by mouth daily before breakfast.    lovastatin  (MEVACOR) 40 MG tablet Take 40 mg by mouth at bedtime.   metoprolol succinate (TOPROL-XL) 25 MG 24 hr tablet Take 0.5 tablets (12.5 mg total) by mouth daily.   nicotine (NICODERM CQ - DOSED IN MG/24 HOURS) 21 mg/24hr patch Place 21 mg onto the skin daily.   Omega-3 Fatty Acids (FISH OIL PO) Take 1 capsule by mouth daily.   potassium chloride (KLOR-CON) 10 MEQ tablet Take 1 tablet (10 mEq total) by mouth daily.   predniSONE (DELTASONE) 10 MG tablet Take 2 tab 20 mg daily for 7 days, then take 10 mg daily.   senna-docusate (SENOKOT-S) 8.6-50 MG tablet Take 1 tablet by mouth at bedtime as needed for mild constipation.   SMART SENSE THIN LANCETS 26G MISC 1 each by Does not apply route 2 (two) times daily.   Tiotropium Bromide-Olodaterol (STIOLTO RESPIMAT) 2.5-2.5 MCG/ACT AERS Inhale 2 puffs into the lungs daily.     Allergies:   Patient has no known allergies.   Social History   Socioeconomic History   Marital status: Widowed    Spouse name: Not on file   Number of children: Not on file   Years of education: Not on file   Highest education level: Not on file  Occupational History   Not on file  Tobacco Use   Smoking status: Former    Packs/day: 0.75    Years: 60.00    Pack years: 45.00    Types: Cigarettes    Quit date: 05/11/2021    Years since quitting: 0.1   Smokeless tobacco: Former    Quit date: 09/24/1962  Vaping Use   Vaping Use: Never used  Substance and Sexual Activity   Alcohol use: Yes   Drug use: No   Sexual activity: Not on file  Other Topics Concern   Not on file  Social History Narrative   Not on file   Social Determinants of Health   Financial Resource Strain: Not on file  Food Insecurity: Not on file  Transportation Needs: Not on file  Physical Activity: Not on file  Stress: Not on file  Social Connections: Not on file     Family History: The patient's family history includes  Diabetes in her mother; Hyperlipidemia in her sister; Hypertension in her  mother and sister; Kidney disease in her mother; Other in her father.  ROS:   Please see the history of present illness.     (+) chest pain, located right side of chest  (+) shortness of breath  (+) leg and hip cramping  (+) lightheadedness All other systems reviewed and are negative.  EKGs/Labs/Other Studies Reviewed:    The following studies were reviewed today:  Echo 6/22:   IMPRESSIONS:   1. Left ventricular ejection fraction, by estimation, is 60 to 65%. The  left ventricle has normal function. The left ventricle has no regional  wall motion abnormalities. Left ventricular diastolic parameters are  consistent with Grade II diastolic  dysfunction (pseudonormalization). Elevated left ventricular end-diastolic  pressure.   2. Right ventricular systolic function is low normal. The right  ventricular size is mildly enlarged. There is severely elevated pulmonary  artery systolic pressure. The estimated right ventricular systolic  pressure is 62.5 mmHg.   3. Left atrial size was moderately dilated.   4. Right atrial size was mild to moderately dilated.   5. The mitral valve is degenerative. Moderate mitral valve regurgitation.  No evidence of mitral stenosis. Moderate mitral annular calcification.   6. Tricuspid valve regurgitation is moderate.   7. The aortic valve is calcified. There is moderate calcification of the  aortic valve. There is moderate thickening of the aortic valve. Aortic  valve regurgitation is mild to moderate. Mild aortic valve stenosis.   8. Aortic dilatation noted. There is borderline dilatation of the  ascending aorta, measuring 38 mm.   Comparison(s): No significant change from prior study.  Echo 02/22  IMPRESSIONS    1. Left ventricular ejection fraction, by estimation, is 40 to 45%. The  left ventricle has mildly decreased function. The left ventricle  demonstrates global hypokinesis. There is mild concentric left ventricular  hypertrophy.  Diastolic function is  indeterminant due to Afib.   2. Right ventricular systolic function is mildly reduced. The right  ventricular size is normal. There is mildly elevated pulmonary artery  systolic pressure.   3. Left atrial size was severely dilated.   4. Right atrial size was severely dilated.   5. The mitral valve is abnormal. Moderate mitral valve regurgitation.  Moderate mitral annular calcification.   6. Tricuspid valve regurgitation is mild to moderate.   7. The aortic valve is tricuspid. There is moderate calcification of the  aortic valve. There is moderate thickening of the aortic valve. Aortic  valve regurgitation is moderate. Mild aortic valve stenosis.   8. The inferior vena cava is dilated in size with <50% respiratory  variability, suggesting right atrial pressure of 15 mmHg.   Comparison(s): Compared to prior TTE, the LVEF appears depressed to  40-45%, there is now moderate MR and AI. Patient is also in Afib currently  with rapid ventricular response.   Echo 01/22  IMPRESSIONS:   1. Left ventricular ejection fraction, by estimation, is 60 to 65%. The  left ventricle has normal function. The left ventricle has no regional  wall motion abnormalities. There is mild left ventricular hypertrophy.  Left ventricular diastolic parameters  are consistent with Grade II diastolic dysfunction (pseudonormalization).  Elevated left ventricular end-diastolic pressure. The E/e' is 24.   2. Right ventricular systolic function is normal. The right ventricular  size is normal. There is severely elevated pulmonary artery systolic  pressure. The estimated right ventricular systolic pressure is 71.2 mmHg.     3. Left atrial size was severely dilated.   4. Right atrial size was mildly dilated.   5. The mitral valve is grossly normal. Trivial mitral valve  regurgitation.   6. The aortic valve is tricuspid. Aortic valve regurgitation is mild.  Mild aortic valve stenosis. Aortic valve  area, by VTI measures 1.71 cm.  Aortic valve mean gradient measures 8.0 mmHg. Aortic valve Vmax measures  2.17 m/s.   7. The inferior vena cava is normal in size with greater than 50%  respiratory variability, suggesting right atrial pressure of 3 mmHg.   Comparison(s): Changes from prior study are noted. 09/06/20: LVEF 60-65%,  RVSP 70 mmHG, severely dilated and hypokinetic RV. In my review of the  previous study, the RV does not appear significantly dilated or  hypokinetic.   EKG:   07/22: no EKG was ordered today 05/22:normal sinus rhythm, rate 82, nonspecific T wave flattening  Recent Labs: 05/18/2021: B Natriuretic Peptide 509.8 05/19/2021: ALT 17; TSH 2.028 07/01/2021: BUN 15; Creatinine, Ser 0.84; Hemoglobin 15.6; Magnesium 2.5; Platelets 133; Potassium 5.0; Sodium 142  Recent Lipid Panel    Component Value Date/Time   CHOL 105 01/21/2021 0242   TRIG 77 01/21/2021 0242   HDL 28 (L) 01/21/2021 0242   CHOLHDL 3.8 01/21/2021 0242   VLDL 15 01/21/2021 0242   LDLCALC 62 01/21/2021 0242    Physical Exam:    VS:  BP (!) 132/52   Pulse 79   Ht _0  (1.676 m)   Wt 166 lb 3.2 oz (75.4 kg)   BMI 26.83 kg/m     Wt Readings from Last 3 Encounters:  07/01/21 166 lb 3.2 oz (75.4 kg)  06/02/21 164 lb (74.4 kg)  05/22/21 169 lb 15.6 oz (77.1 kg)     GEN: in no acute distress HEENT: Normal NECK: No JVD; No carotid bruits LYMPHATICS: No lymphadenopathy CARDIAC: RRR, no murmurs, rubs, gallops RESPIRATORY:  CTAB ABDOMEN: Soft, non-tender, non-distended MUSCULOSKELETAL:  No edema; No deformity  SKIN: Warm and dry NEUROLOGIC:  Alert and oriented x 3 PSYCHIATRIC:  Normal affect   ASSESSMENT:    1. Pulmonary hypertension ([place])   2. PAF (paroxysmal atrial fibrillation) ([place])   3. Chronic combined systolic (congestive) and diastolic (congestive) heart failure ([place])   4. Chronic respiratory failure with hypoxia (HCC)     PLAN:     Chronic combined systolic and diastolic  heart failure: Echocardiogram on 01/22/2021 showed LVEF 40 to 45%, severe biatrial dilatation, mildly reduced RV function, moderate MR, mild to moderate TR, moderate AI.  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  Repeat echocardiogram 05/30/2021 showed EF 60 to 62%, grade 2 diastolic dysfunction, RVSP 63, moderate MR, moderate TR. -She was initially started on amiodarone during hospitalization but was discontinued due to transaminitis.  Amiodarone is likely not a good long-term choice for her given her pulmonary issues as above.  She does appear to be maintaining sinus rhythm.   -Continue Toprol-XL 12.5 mg daily -Continue Lasix 40 mg daily.  Appears euvolemic.  Check BMP/magnesium  Pulmonary hypertension: Suspect class III in setting of interstitial lung disease as well as class II from chronic combined systolic/diastolic heart failure. -Discussed with Dr [name], may benefit from Tyvaso but needs right heart catheterization, will arrange  Chronic respiratory failure with hypoxia: Hypoxia is likely multifactorial in setting of interstitial lung disease and chronic combined systolic and diastolic heart failure.  -Follow with Dr [name] in pulmonology  Paroxysmal atrial fibrillation: Diagnosed during admission  with COVID-19 pneumonia in February 2022.  Converted to sinus rhythm with amiodarone, which was discontinued due to elevated liver enzymes -Appears to be maintaining sinus rhythm -Continue Toprol-XL 12.5 mg daily -Continue Eliquis 5 mg twice daily  RTC in 3 months  Medication Adjustments/Labs and Tests Ordered: Current medicines are reviewed at length with the patient today.  Concerns regarding medicines are outlined above.  Orders Placed This Encounter  Procedures   Basic metabolic panel   CBC   Magnesium    No orders of the defined types were placed in this encounter.   Patient Instructions  Medication Instructions:  Your physician  recommends that you continue on your current medications as directed. Please refer to the Current Medication list given to you today.  *If you need a refill on your cardiac medications before your next appointment, please call your pharmacy*   Lab Work: BMET, CBC, Mag today  If you have labs (blood work) drawn today and your tests are completely normal, you will receive your results only by: [place] (if you have MyChart) OR A paper copy in the mail If you have any lab test that is abnormal or we need to change your treatment, we will call you to review the results.  Follow-Up: At [facility], you and your health needs are our priority.  As part of our continuing mission to provide you with exceptional heart care, we have created designated Provider Care Teams.  These Care Teams include your primary Cardiologist (physician) and Advanced Practice Providers (APPs -  Physician Assistants and Nurse Practitioners) who all work together to provide you with the care you need, when you need it.  We recommend signing up for the patient portal called "MyChart".  Sign up information is provided on this After Visit Summary.  MyChart is used to connect with patients for Virtual Visits (Telemedicine).  Patients are able to view lab/test results, encounter notes, upcoming appointments, etc.  Non-urgent messages can be sent to your provider as well.   To learn more about what you can do with MyChart, go to [email or web address].    Your next appointment:   3 month(s)  The format for your next appointment:   In Person  Provider:   Oswaldo Milian, MD    Ardell Isaacs as a scribe for [name], MD.,have documented all relevant documentation on the behalf of [name], MD,as directed by  [name], MD while in the presence of [name], MD.  I, [name], MD, have reviewed all documentation for this visit. The  documentation on 07/03/21 for the exam, diagnosis, procedures, and orders are all accurate and complete.  Signed, [name], MD  07/03/2021 10:45 PM    [facility]

## 2021-07-01 ENCOUNTER — Encounter: Payer: Self-pay | Admitting: Cardiology

## 2021-07-01 ENCOUNTER — Ambulatory Visit: Payer: Medicare HMO | Admitting: Cardiology

## 2021-07-01 ENCOUNTER — Other Ambulatory Visit: Payer: Self-pay

## 2021-07-01 VITALS — BP 132/52 | HR 79 | Ht 66.0 in | Wt 166.2 lb

## 2021-07-01 DIAGNOSIS — E039 Hypothyroidism, unspecified: Secondary | ICD-10-CM | POA: Diagnosis not present

## 2021-07-01 DIAGNOSIS — J9621 Acute and chronic respiratory failure with hypoxia: Secondary | ICD-10-CM | POA: Diagnosis not present

## 2021-07-01 DIAGNOSIS — I270 Primary pulmonary hypertension: Secondary | ICD-10-CM | POA: Diagnosis not present

## 2021-07-01 DIAGNOSIS — I480 Paroxysmal atrial fibrillation: Secondary | ICD-10-CM

## 2021-07-01 DIAGNOSIS — I5042 Chronic combined systolic (congestive) and diastolic (congestive) heart failure: Secondary | ICD-10-CM | POA: Diagnosis not present

## 2021-07-01 DIAGNOSIS — E785 Hyperlipidemia, unspecified: Secondary | ICD-10-CM | POA: Diagnosis not present

## 2021-07-01 DIAGNOSIS — I5023 Acute on chronic systolic (congestive) heart failure: Secondary | ICD-10-CM | POA: Diagnosis not present

## 2021-07-01 DIAGNOSIS — J9611 Chronic respiratory failure with hypoxia: Secondary | ICD-10-CM | POA: Diagnosis not present

## 2021-07-01 DIAGNOSIS — I083 Combined rheumatic disorders of mitral, aortic and tricuspid valves: Secondary | ICD-10-CM | POA: Diagnosis not present

## 2021-07-01 DIAGNOSIS — I110 Hypertensive heart disease with heart failure: Secondary | ICD-10-CM | POA: Diagnosis not present

## 2021-07-01 DIAGNOSIS — I272 Pulmonary hypertension, unspecified: Secondary | ICD-10-CM

## 2021-07-01 DIAGNOSIS — E1165 Type 2 diabetes mellitus with hyperglycemia: Secondary | ICD-10-CM | POA: Diagnosis not present

## 2021-07-01 DIAGNOSIS — J8410 Pulmonary fibrosis, unspecified: Secondary | ICD-10-CM | POA: Diagnosis not present

## 2021-07-01 NOTE — Patient Instructions (Signed)
Medication Instructions:  Your physician recommends that you continue on your current medications as directed. Please refer to the Current Medication list given to you today.  *If you need a refill on your cardiac medications before your next appointment, please call your pharmacy*   Lab Work: BMET, CBC, Mag today  If you have labs (blood work) drawn today and your tests are completely normal, you will receive your results only by: [place] (if you have MyChart) OR A paper copy in the mail If you have any lab test that is abnormal or we need to change your treatment, we will call you to review the results.  Follow-Up: At [facility], you and your health needs are our priority.  As part of our continuing mission to provide you with exceptional heart care, we have created designated Provider Care Teams.  These Care Teams include your primary Cardiologist (physician) and Advanced Practice Providers (APPs -  Physician Assistants and Nurse Practitioners) who all work together to provide you with the care you need, when you need it.  We recommend signing up for the patient portal called "MyChart".  Sign up information is provided on this After Visit Summary.  MyChart is used to connect with patients for Virtual Visits (Telemedicine).  Patients are able to view lab/test results, encounter notes, upcoming appointments, etc.  Non-urgent messages can be sent to your provider as well.   To learn more about what you can do with MyChart, go to [email or web address].    Your next appointment:   3 month(s)  The format for your next appointment:   In Person  Provider:   Oswaldo Milian, MD

## 2021-07-02 LAB — CBC
Hematocrit: 47.2 % — ABNORMAL HIGH (ref 34.0–46.6)
Hemoglobin: 15.6 g/dL (ref 11.1–15.9)
MCH: 33.1 pg — ABNORMAL HIGH (ref 26.6–33.0)
MCHC: 33.1 g/dL (ref 31.5–35.7)
MCV: 100 fL — ABNORMAL HIGH (ref 79–97)
Platelets: 133 10*3/uL — ABNORMAL LOW (ref 150–450)
RBC: 4.72 x10E6/uL (ref 3.77–5.28)
RDW: 12.6 % (ref 11.7–15.4)
WBC: 7 10*3/uL (ref 3.4–10.8)

## 2021-07-02 LAB — BASIC METABOLIC PANEL
BUN/Creatinine Ratio: 18 (ref 12–28)
BUN: 15 mg/dL (ref 8–27)
CO2: 20 mmol/L (ref 20–29)
Calcium: 9.5 mg/dL (ref 8.7–10.3)
Chloride: 105 mmol/L (ref 96–106)
Creatinine, Ser: 0.84 mg/dL (ref 0.57–1.00)
Glucose: 73 mg/dL (ref 65–99)
Potassium: 5 mmol/L (ref 3.5–5.2)
Sodium: 142 mmol/L (ref 134–144)
eGFR: 71 mL/min/{1.73_m2} (ref >59)

## 2021-07-02 LAB — MAGNESIUM: Magnesium: 2.5 mg/dL — ABNORMAL HIGH (ref 1.6–2.3)

## 2021-07-04 ENCOUNTER — Other Ambulatory Visit: Payer: Self-pay | Admitting: *Deleted

## 2021-07-04 ENCOUNTER — Telehealth: Payer: Self-pay | Admitting: Cardiology

## 2021-07-04 DIAGNOSIS — J449 Chronic obstructive pulmonary disease, unspecified: Secondary | ICD-10-CM | POA: Diagnosis not present

## 2021-07-04 DIAGNOSIS — I4891 Unspecified atrial fibrillation: Secondary | ICD-10-CM | POA: Diagnosis not present

## 2021-07-04 DIAGNOSIS — E1169 Type 2 diabetes mellitus with other specified complication: Secondary | ICD-10-CM | POA: Diagnosis not present

## 2021-07-04 DIAGNOSIS — I10 Essential (primary) hypertension: Secondary | ICD-10-CM | POA: Diagnosis not present

## 2021-07-04 DIAGNOSIS — E1165 Type 2 diabetes mellitus with hyperglycemia: Secondary | ICD-10-CM | POA: Diagnosis not present

## 2021-07-04 DIAGNOSIS — E785 Hyperlipidemia, unspecified: Secondary | ICD-10-CM | POA: Diagnosis not present

## 2021-07-04 DIAGNOSIS — I272 Pulmonary hypertension, unspecified: Secondary | ICD-10-CM

## 2021-07-04 DIAGNOSIS — E039 Hypothyroidism, unspecified: Secondary | ICD-10-CM | POA: Diagnosis not present

## 2021-07-04 DIAGNOSIS — M810 Age-related osteoporosis without current pathological fracture: Secondary | ICD-10-CM | POA: Diagnosis not present

## 2021-07-04 MED ORDER — SODIUM CHLORIDE 0.9% FLUSH: 3.0000 mL | Freq: Two times a day (BID) | INTRAVENOUS | Status: DC

## 2021-07-04 NOTE — Telephone Encounter (Signed)
Patient called and mentioned that she is having a heart cath on Tuesday and was supposed to stop taking Eliquis a few days prior but forgot and took one this morning. What should she do?

## 2021-07-04 NOTE — Telephone Encounter (Signed)
Spoke to patient . Patient she states she  took morning dose of Eliquis on Sunday 07/03/21. She states she was to taking medication every morning and she forgot.  RN asked patient has she taken anymore since  . She stated no .   RN notified Dr Ellyn Hack , who will be doing the procedure right heart cath . He states as long as she does not take medication today. The procedure can take place.  RN informed patient not to take anymore . Patient verbalized understanding.

## 2021-07-05 ENCOUNTER — Encounter (HOSPITAL_COMMUNITY)
Admission: RE | Disposition: A | Discharge status: Home / Self Care | Payer: Self-pay | Source: Home / Self Care | Attending: Cardiology

## 2021-07-05 ENCOUNTER — Encounter (HOSPITAL_COMMUNITY): Payer: Self-pay | Admitting: Cardiology

## 2021-07-05 ENCOUNTER — Ambulatory Visit (HOSPITAL_COMMUNITY)
Admission: RE | Admit: 2021-07-05 | Discharge: 2021-07-05 | Disposition: A | Discharge status: Home / Self Care | Payer: Medicare HMO | Attending: Cardiology | Admitting: Cardiology

## 2021-07-05 ENCOUNTER — Other Ambulatory Visit: Payer: Self-pay

## 2021-07-05 DIAGNOSIS — Z79890 Hormone replacement therapy (postmenopausal): Secondary | ICD-10-CM | POA: Insufficient documentation

## 2021-07-05 DIAGNOSIS — I110 Hypertensive heart disease with heart failure: Secondary | ICD-10-CM | POA: Diagnosis not present

## 2021-07-05 DIAGNOSIS — J849 Interstitial pulmonary disease, unspecified: Secondary | ICD-10-CM | POA: Diagnosis not present

## 2021-07-05 DIAGNOSIS — Z79899 Other long term (current) drug therapy: Secondary | ICD-10-CM | POA: Diagnosis not present

## 2021-07-05 DIAGNOSIS — I5032 Chronic diastolic (congestive) heart failure: Secondary | ICD-10-CM | POA: Diagnosis present

## 2021-07-05 DIAGNOSIS — E785 Hyperlipidemia, unspecified: Secondary | ICD-10-CM | POA: Diagnosis not present

## 2021-07-05 DIAGNOSIS — Z8249 Family history of ischemic heart disease and other diseases of the circulatory system: Secondary | ICD-10-CM | POA: Diagnosis not present

## 2021-07-05 DIAGNOSIS — Z794 Long term (current) use of insulin: Secondary | ICD-10-CM | POA: Diagnosis not present

## 2021-07-05 DIAGNOSIS — R0602 Shortness of breath: Secondary | ICD-10-CM | POA: Diagnosis present

## 2021-07-05 DIAGNOSIS — I272 Pulmonary hypertension, unspecified: Secondary | ICD-10-CM

## 2021-07-05 DIAGNOSIS — J9611 Chronic respiratory failure with hypoxia: Secondary | ICD-10-CM | POA: Diagnosis not present

## 2021-07-05 DIAGNOSIS — Z7901 Long term (current) use of anticoagulants: Secondary | ICD-10-CM | POA: Diagnosis not present

## 2021-07-05 DIAGNOSIS — E119 Type 2 diabetes mellitus without complications: Secondary | ICD-10-CM | POA: Insufficient documentation

## 2021-07-05 DIAGNOSIS — I480 Paroxysmal atrial fibrillation: Secondary | ICD-10-CM | POA: Diagnosis not present

## 2021-07-05 DIAGNOSIS — I5042 Chronic combined systolic (congestive) and diastolic (congestive) heart failure: Secondary | ICD-10-CM | POA: Diagnosis not present

## 2021-07-05 DIAGNOSIS — Z87891 Personal history of nicotine dependence: Secondary | ICD-10-CM | POA: Diagnosis not present

## 2021-07-05 HISTORY — PX: RIGHT HEART CATH: CATH118263

## 2021-07-05 LAB — POCT I-STAT EG7
Acid-Base Excess: 1 mmol/L (ref 0.0–2.0)
Acid-Base Excess: 0 mmol/L (ref 0.0–2.0)
Acid-base deficit: 1 mmol/L (ref 0.0–2.0)
Bicarbonate: 24 mmol/L (ref 20.0–28.0)
Bicarbonate: 25.4 mmol/L (ref 20.0–28.0)
Bicarbonate: 24.5 mmol/L (ref 20.0–28.0)
Calcium, Ion: 1.2 mmol/L (ref 1.15–1.40)
Calcium, Ion: 1.24 mmol/L (ref 1.15–1.40)
Calcium, Ion: 1.24 mmol/L (ref 1.15–1.40)
HCT: 41 % (ref 36.0–46.0)
HCT: 43 % (ref 36.0–46.0)
HCT: 43 % (ref 36.0–46.0)
Hemoglobin: 13.9 g/dL (ref 12.0–15.0)
Hemoglobin: 14.6 g/dL (ref 12.0–15.0)
Hemoglobin: 14.6 g/dL (ref 12.0–15.0)
O2 Saturation: 47 %
O2 Saturation: 61 %
O2 Saturation: 47 %
Potassium: 3.8 mmol/L (ref 3.5–5.1)
Potassium: 4.1 mmol/L (ref 3.5–5.1)
Potassium: 4 mmol/L (ref 3.5–5.1)
Sodium: 143 mmol/L (ref 135–145)
Sodium: 144 mmol/L (ref 135–145)
Sodium: 143 mmol/L (ref 135–145)
TCO2: 25 mmol/L (ref 22–32)
TCO2: 27 mmol/L (ref 22–32)
TCO2: 26 mmol/L (ref 22–32)
pCO2, Ven: 40.1 mmHg — ABNORMAL LOW (ref 44.0–60.0)
pCO2, Ven: 40.8 mmHg — ABNORMAL LOW (ref 44.0–60.0)
pCO2, Ven: 40.2 mmHg — ABNORMAL LOW (ref 44.0–60.0)
pH, Ven: 7.385 (ref 7.250–7.430)
pH, Ven: 7.402 (ref 7.250–7.430)
pH, Ven: 7.392 (ref 7.250–7.430)
pO2, Ven: 26 mmHg — CL (ref 32.0–45.0)
pO2, Ven: 32 mmHg (ref 32.0–45.0)
pO2, Ven: 26 mmHg — CL (ref 32.0–45.0)

## 2021-07-05 LAB — GLUCOSE, CAPILLARY: Glucose-Capillary: 132 mg/dL — ABNORMAL HIGH (ref 70–99)

## 2021-07-05 SURGERY — RIGHT HEART CATH
Anesthesia: LOCAL

## 2021-07-05 MED ORDER — ONDANSETRON HCL 4 MG/2ML IJ SOLN: 4.0000 mg | Freq: Four times a day (QID) | INTRAMUSCULAR | Status: DC | PRN

## 2021-07-05 MED ORDER — SODIUM CHLORIDE 0.9% FLUSH: 3.0000 mL | INTRAVENOUS | Status: DC | PRN

## 2021-07-05 MED ORDER — LABETALOL HCL 5 MG/ML IV SOLN: 10.0000 mg | INTRAVENOUS | Status: DC | PRN

## 2021-07-05 MED ORDER — SODIUM CHLORIDE 0.9 % IV SOLN: 250.0000 mL | INTRAVENOUS | Status: DC | PRN

## 2021-07-05 MED ORDER — ASPIRIN 81 MG PO CHEW: 81.0000 mg | CHEWABLE_TABLET | ORAL | Status: DC

## 2021-07-05 MED ORDER — SODIUM CHLORIDE 0.9 % IV SOLN: INTRAVENOUS | Status: DC

## 2021-07-05 MED ORDER — HYDRALAZINE HCL 20 MG/ML IJ SOLN: 10.0000 mg | INTRAMUSCULAR | Status: DC | PRN

## 2021-07-05 MED ORDER — HEPARIN (PORCINE) IN NACL 1000-0.9 UT/500ML-% IV SOLN
INTRAVENOUS | Status: DC | PRN
  Administered 2021-07-05: 500 mL

## 2021-07-05 MED ORDER — LIDOCAINE HCL (PF) 1 % IJ SOLN
INTRAMUSCULAR | Status: DC | PRN
  Administered 2021-07-05: 2 mL

## 2021-07-05 MED ORDER — SODIUM CHLORIDE 0.9% FLUSH: 3.0000 mL | Freq: Two times a day (BID) | INTRAVENOUS | Status: DC

## 2021-07-05 MED ORDER — LIDOCAINE HCL (PF) 1 % IJ SOLN
INTRAMUSCULAR | Status: AC
  Filled 2021-07-05: qty 30

## 2021-07-05 MED ORDER — ACETAMINOPHEN 325 MG PO TABS: 650.0000 mg | ORAL_TABLET | ORAL | Status: DC | PRN

## 2021-07-05 SURGICAL SUPPLY — 9 items
CATH SWAN GANZ 7F STRAIGHT (CATHETERS) ×1 IMPLANT
GLIDESHEATH SLENDER 7FR .021G (SHEATH) ×1 IMPLANT
PACK CARDIAC CATHETERIZATION (CUSTOM PROCEDURE TRAY) ×1 IMPLANT
PROTECTION STATION PRESSURIZED (MISCELLANEOUS) ×2
SHEATH PROBE COVER 6X72 (BAG) ×1 IMPLANT
STATION PROTECTION PRESSURIZED (MISCELLANEOUS) IMPLANT
TRANSDUCER W/STOPCOCK (MISCELLANEOUS) ×1 IMPLANT
TUBING ART PRESS 72  MALE/FEM (TUBING) ×2
TUBING ART PRESS 72 MALE/FEM (TUBING) IMPLANT

## 2021-07-05 NOTE — Interval H&P Note (Signed)
History and Physical Interval Note:  07/05/2021 11:09 AM  Elizabeth Melton  has presented today for surgery, with the diagnosis of pulmonary hypertension.  The various methods of treatment have been discussed with the patient and family. After consideration of risks, benefits and other options for treatment, the patient has consented to  Procedure(s): RIGHT HEART CATH (N/A) as a surgical intervention.  The patient's history has been reviewed, patient examined, no change in status, stable for surgery.  I have reviewed the patient's chart and labs.  Questions were answered to the patient's satisfaction.     Glenetta Hew

## 2021-07-07 ENCOUNTER — Telehealth: Payer: Self-pay | Admitting: Internal Medicine

## 2021-07-07 DIAGNOSIS — E039 Hypothyroidism, unspecified: Secondary | ICD-10-CM | POA: Diagnosis not present

## 2021-07-07 DIAGNOSIS — E1165 Type 2 diabetes mellitus with hyperglycemia: Secondary | ICD-10-CM | POA: Diagnosis not present

## 2021-07-07 DIAGNOSIS — I5023 Acute on chronic systolic (congestive) heart failure: Secondary | ICD-10-CM | POA: Diagnosis not present

## 2021-07-07 DIAGNOSIS — J9621 Acute and chronic respiratory failure with hypoxia: Secondary | ICD-10-CM | POA: Diagnosis not present

## 2021-07-07 DIAGNOSIS — J8410 Pulmonary fibrosis, unspecified: Secondary | ICD-10-CM | POA: Diagnosis not present

## 2021-07-07 DIAGNOSIS — I083 Combined rheumatic disorders of mitral, aortic and tricuspid valves: Secondary | ICD-10-CM | POA: Diagnosis not present

## 2021-07-07 DIAGNOSIS — E785 Hyperlipidemia, unspecified: Secondary | ICD-10-CM | POA: Diagnosis not present

## 2021-07-07 DIAGNOSIS — I480 Paroxysmal atrial fibrillation: Secondary | ICD-10-CM | POA: Diagnosis not present

## 2021-07-07 DIAGNOSIS — I110 Hypertensive heart disease with heart failure: Secondary | ICD-10-CM | POA: Diagnosis not present

## 2021-07-07 NOTE — Telephone Encounter (Signed)
Called and spoke with patient directly. She stated that she is currently on 3L of O2 and at times, her O2 levels have dropped down to 72% (with 3L of O2) with exertion. When she sits down to rest, her O2 levels will slowly creep back to the upper 80s. This process usually takes about 30-40 mins each time.   She has a productive cough with clear phlegm. She denied seeing color in her phlegm. She also denied any fevers but states that every once in a while she will have chills. Denied any wheezing.   She is currently on prednisone 24m. She completed the 270mprednisone once daily that Dr. HuSilas Floodent in for her on 06/28/21.   She had a right heart cath done on 07/26 by Dr. DaGlenetta Hew  I did attempt to see if I could get her scheduled for an OV either today or tomorrow but we do not have any openings in the office. I advise her that with her O2 levels being that low, it may be in her best interest to go to the hospital. She verbalized understanding but wanted to know what MR would recommend.   MR, can you please advise? Thanks!

## 2021-07-07 NOTE — Telephone Encounter (Signed)
Do not know exactly what is going on but 2d agon Right heart cath the wedge pressure is high despite lasix 82m daily  Plan  - double up lasix to 452mbid - take kcl 1074mdaily  - give first avail appt with pulm - if she is getting worse go to hospital   Right Heart Pressures Hemodynamic findings consistent with severe pulmonary hypertension. Mixed pulmonary hypertension with Transpulmonary Gradient of 19 mmHg. PAP-mean (no oxygen): 73/31 mmHg-47 mmHg There is a positive vasodilator response. PAP-mean (3L [place]): 59/20 mmHg - 36 mmHg Elevated LV EDP consistent with volume overload. PCWP 28 mmHg  Right Atrium The right atrial size is at the upper limits of normal. Right atrial pressure is elevated. 8 mmHg  Right Ventricle RVP-EDP: 64/0 mmHg - 7 mmHg

## 2021-07-07 NOTE — Telephone Encounter (Signed)
Called patient but she did not answer. Left message for her to call back.  

## 2021-07-07 NOTE — Telephone Encounter (Signed)
Per Eustaquio Maize, pt is still having trouble getting sat above 90% on 3L. When pt exerts herself, she drops to 76%. Pt just had a heart cath Tuesday. When pt rests, she starts 79%, and slowly rises in 80's. Pt states this am, oxygen sat was 72% took awhile to get into the 58's Beth says pt has ronchi in right upper lobe, but that lung sounds sound "hollow"(like after pt has covid) Per Beth, MR started pt on prednisone, which Beth thought would help. Pt is coughing quite a bit, constant "hacking" cough. Pt has 5L concentor, if pt needs to go up on o2. Beth can be reached at [phone]

## 2021-07-08 DIAGNOSIS — I480 Paroxysmal atrial fibrillation: Secondary | ICD-10-CM | POA: Diagnosis not present

## 2021-07-08 DIAGNOSIS — E1165 Type 2 diabetes mellitus with hyperglycemia: Secondary | ICD-10-CM | POA: Diagnosis not present

## 2021-07-08 DIAGNOSIS — J9621 Acute and chronic respiratory failure with hypoxia: Secondary | ICD-10-CM | POA: Diagnosis not present

## 2021-07-08 DIAGNOSIS — J849 Interstitial pulmonary disease, unspecified: Secondary | ICD-10-CM | POA: Diagnosis not present

## 2021-07-08 DIAGNOSIS — E785 Hyperlipidemia, unspecified: Secondary | ICD-10-CM | POA: Diagnosis not present

## 2021-07-08 DIAGNOSIS — Z86711 Personal history of pulmonary embolism: Secondary | ICD-10-CM | POA: Diagnosis not present

## 2021-07-08 DIAGNOSIS — J8410 Pulmonary fibrosis, unspecified: Secondary | ICD-10-CM | POA: Diagnosis not present

## 2021-07-08 DIAGNOSIS — I5023 Acute on chronic systolic (congestive) heart failure: Secondary | ICD-10-CM | POA: Diagnosis not present

## 2021-07-08 DIAGNOSIS — J449 Chronic obstructive pulmonary disease, unspecified: Secondary | ICD-10-CM | POA: Diagnosis not present

## 2021-07-08 DIAGNOSIS — I739 Peripheral vascular disease, unspecified: Secondary | ICD-10-CM | POA: Diagnosis not present

## 2021-07-08 DIAGNOSIS — Z7901 Long term (current) use of anticoagulants: Secondary | ICD-10-CM | POA: Diagnosis not present

## 2021-07-08 DIAGNOSIS — J9611 Chronic respiratory failure with hypoxia: Secondary | ICD-10-CM | POA: Diagnosis not present

## 2021-07-08 DIAGNOSIS — I110 Hypertensive heart disease with heart failure: Secondary | ICD-10-CM | POA: Diagnosis not present

## 2021-07-08 DIAGNOSIS — Z720 Tobacco use: Secondary | ICD-10-CM | POA: Diagnosis not present

## 2021-07-08 DIAGNOSIS — I4891 Unspecified atrial fibrillation: Secondary | ICD-10-CM | POA: Diagnosis not present

## 2021-07-08 DIAGNOSIS — I083 Combined rheumatic disorders of mitral, aortic and tricuspid valves: Secondary | ICD-10-CM | POA: Diagnosis not present

## 2021-07-08 DIAGNOSIS — E039 Hypothyroidism, unspecified: Secondary | ICD-10-CM | POA: Diagnosis not present

## 2021-07-11 DIAGNOSIS — I110 Hypertensive heart disease with heart failure: Secondary | ICD-10-CM | POA: Diagnosis not present

## 2021-07-11 DIAGNOSIS — I083 Combined rheumatic disorders of mitral, aortic and tricuspid valves: Secondary | ICD-10-CM | POA: Diagnosis not present

## 2021-07-11 DIAGNOSIS — J8410 Pulmonary fibrosis, unspecified: Secondary | ICD-10-CM | POA: Diagnosis not present

## 2021-07-11 DIAGNOSIS — E1165 Type 2 diabetes mellitus with hyperglycemia: Secondary | ICD-10-CM | POA: Diagnosis not present

## 2021-07-11 DIAGNOSIS — I480 Paroxysmal atrial fibrillation: Secondary | ICD-10-CM | POA: Diagnosis not present

## 2021-07-11 DIAGNOSIS — J9621 Acute and chronic respiratory failure with hypoxia: Secondary | ICD-10-CM | POA: Diagnosis not present

## 2021-07-11 DIAGNOSIS — E785 Hyperlipidemia, unspecified: Secondary | ICD-10-CM | POA: Diagnosis not present

## 2021-07-11 DIAGNOSIS — I5023 Acute on chronic systolic (congestive) heart failure: Secondary | ICD-10-CM | POA: Diagnosis not present

## 2021-07-11 DIAGNOSIS — E039 Hypothyroidism, unspecified: Secondary | ICD-10-CM | POA: Diagnosis not present

## 2021-07-13 ENCOUNTER — Encounter (HOSPITAL_COMMUNITY): Payer: Self-pay

## 2021-07-13 ENCOUNTER — Emergency Department (HOSPITAL_COMMUNITY): Payer: Medicare HMO

## 2021-07-13 ENCOUNTER — Observation Stay (HOSPITAL_COMMUNITY): Payer: Medicare HMO

## 2021-07-13 ENCOUNTER — Other Ambulatory Visit: Payer: Self-pay

## 2021-07-13 ENCOUNTER — Inpatient Hospital Stay (HOSPITAL_COMMUNITY)
Admission: EM | Admit: 2021-07-13 | Discharge: 2021-07-15 | DRG: 189 | Disposition: A | Discharge status: Home / Self Care | Payer: Medicare HMO | Attending: Family Medicine | Admitting: Family Medicine

## 2021-07-13 DIAGNOSIS — F17210 Nicotine dependence, cigarettes, uncomplicated: Secondary | ICD-10-CM | POA: Diagnosis present

## 2021-07-13 DIAGNOSIS — J432 Centrilobular emphysema: Secondary | ICD-10-CM | POA: Diagnosis present

## 2021-07-13 DIAGNOSIS — R0602 Shortness of breath: Secondary | ICD-10-CM | POA: Diagnosis not present

## 2021-07-13 DIAGNOSIS — Z20822 Contact with and (suspected) exposure to covid-19: Secondary | ICD-10-CM | POA: Diagnosis present

## 2021-07-13 DIAGNOSIS — J9621 Acute and chronic respiratory failure with hypoxia: Secondary | ICD-10-CM | POA: Diagnosis not present

## 2021-07-13 DIAGNOSIS — I2722 Pulmonary hypertension due to left heart disease: Secondary | ICD-10-CM | POA: Diagnosis present

## 2021-07-13 DIAGNOSIS — T380X5A Adverse effect of glucocorticoids and synthetic analogues, initial encounter: Secondary | ICD-10-CM | POA: Diagnosis present

## 2021-07-13 DIAGNOSIS — Z794 Long term (current) use of insulin: Secondary | ICD-10-CM

## 2021-07-13 DIAGNOSIS — E785 Hyperlipidemia, unspecified: Secondary | ICD-10-CM | POA: Diagnosis present

## 2021-07-13 DIAGNOSIS — I5023 Acute on chronic systolic (congestive) heart failure: Secondary | ICD-10-CM | POA: Diagnosis not present

## 2021-07-13 DIAGNOSIS — I50813 Acute on chronic right heart failure: Secondary | ICD-10-CM | POA: Diagnosis not present

## 2021-07-13 DIAGNOSIS — Z841 Family history of disorders of kidney and ureter: Secondary | ICD-10-CM

## 2021-07-13 DIAGNOSIS — E1169 Type 2 diabetes mellitus with other specified complication: Secondary | ICD-10-CM

## 2021-07-13 DIAGNOSIS — J84112 Idiopathic pulmonary fibrosis: Secondary | ICD-10-CM | POA: Diagnosis present

## 2021-07-13 DIAGNOSIS — Z79899 Other long term (current) drug therapy: Secondary | ICD-10-CM

## 2021-07-13 DIAGNOSIS — J9611 Chronic respiratory failure with hypoxia: Secondary | ICD-10-CM | POA: Diagnosis present

## 2021-07-13 DIAGNOSIS — I110 Hypertensive heart disease with heart failure: Secondary | ICD-10-CM | POA: Diagnosis not present

## 2021-07-13 DIAGNOSIS — I2723 Pulmonary hypertension due to lung diseases and hypoxia: Secondary | ICD-10-CM | POA: Diagnosis present

## 2021-07-13 DIAGNOSIS — I480 Paroxysmal atrial fibrillation: Secondary | ICD-10-CM | POA: Diagnosis not present

## 2021-07-13 DIAGNOSIS — Z79890 Hormone replacement therapy (postmenopausal): Secondary | ICD-10-CM

## 2021-07-13 DIAGNOSIS — F17200 Nicotine dependence, unspecified, uncomplicated: Secondary | ICD-10-CM | POA: Diagnosis not present

## 2021-07-13 DIAGNOSIS — R590 Localized enlarged lymph nodes: Secondary | ICD-10-CM | POA: Diagnosis not present

## 2021-07-13 DIAGNOSIS — Z833 Family history of diabetes mellitus: Secondary | ICD-10-CM

## 2021-07-13 DIAGNOSIS — Z888 Allergy status to other drugs, medicaments and biological substances status: Secondary | ICD-10-CM | POA: Diagnosis not present

## 2021-07-13 DIAGNOSIS — Z83438 Family history of other disorder of lipoprotein metabolism and other lipidemia: Secondary | ICD-10-CM | POA: Diagnosis not present

## 2021-07-13 DIAGNOSIS — Z8249 Family history of ischemic heart disease and other diseases of the circulatory system: Secondary | ICD-10-CM | POA: Diagnosis not present

## 2021-07-13 DIAGNOSIS — I083 Combined rheumatic disorders of mitral, aortic and tricuspid valves: Secondary | ICD-10-CM | POA: Diagnosis not present

## 2021-07-13 DIAGNOSIS — I2782 Chronic pulmonary embolism: Secondary | ICD-10-CM | POA: Diagnosis present

## 2021-07-13 DIAGNOSIS — I5032 Chronic diastolic (congestive) heart failure: Secondary | ICD-10-CM | POA: Diagnosis not present

## 2021-07-13 DIAGNOSIS — Z7901 Long term (current) use of anticoagulants: Secondary | ICD-10-CM | POA: Diagnosis not present

## 2021-07-13 DIAGNOSIS — I2729 Other secondary pulmonary hypertension: Secondary | ICD-10-CM | POA: Diagnosis present

## 2021-07-13 DIAGNOSIS — E039 Hypothyroidism, unspecified: Secondary | ICD-10-CM | POA: Diagnosis not present

## 2021-07-13 DIAGNOSIS — I700 Atherosclerosis of aorta: Secondary | ICD-10-CM | POA: Diagnosis not present

## 2021-07-13 DIAGNOSIS — E1165 Type 2 diabetes mellitus with hyperglycemia: Secondary | ICD-10-CM | POA: Diagnosis not present

## 2021-07-13 DIAGNOSIS — J8410 Pulmonary fibrosis, unspecified: Secondary | ICD-10-CM | POA: Diagnosis not present

## 2021-07-13 DIAGNOSIS — E119 Type 2 diabetes mellitus without complications: Secondary | ICD-10-CM

## 2021-07-13 DIAGNOSIS — I10 Essential (primary) hypertension: Secondary | ICD-10-CM | POA: Diagnosis present

## 2021-07-13 DIAGNOSIS — J849 Interstitial pulmonary disease, unspecified: Secondary | ICD-10-CM | POA: Diagnosis present

## 2021-07-13 DIAGNOSIS — Z7952 Long term (current) use of systemic steroids: Secondary | ICD-10-CM | POA: Diagnosis not present

## 2021-07-13 DIAGNOSIS — J479 Bronchiectasis, uncomplicated: Secondary | ICD-10-CM | POA: Diagnosis not present

## 2021-07-13 DIAGNOSIS — J439 Emphysema, unspecified: Secondary | ICD-10-CM | POA: Diagnosis not present

## 2021-07-13 HISTORY — DX: Idiopathic pulmonary fibrosis: J84.112

## 2021-07-13 LAB — CBC WITH DIFFERENTIAL/PLATELET
Abs Immature Granulocytes: 0.04 10*3/uL (ref 0.00–0.07)
Basophils Absolute: 0.1 10*3/uL (ref 0.0–0.1)
Basophils Relative: 1 %
Eosinophils Absolute: 0.1 10*3/uL (ref 0.0–0.5)
Eosinophils Relative: 1 %
HCT: 47.5 % — ABNORMAL HIGH (ref 36.0–46.0)
Hemoglobin: 15.3 g/dL — ABNORMAL HIGH (ref 12.0–15.0)
Immature Granulocytes: 0 %
Lymphocytes Relative: 27 %
Lymphs Abs: 2.7 10*3/uL (ref 0.7–4.0)
MCH: 33.2 pg (ref 26.0–34.0)
MCHC: 32.2 g/dL (ref 30.0–36.0)
MCV: 103 fL — ABNORMAL HIGH (ref 80.0–100.0)
Monocytes Absolute: 0.7 10*3/uL (ref 0.1–1.0)
Monocytes Relative: 6 %
Neutro Abs: 6.7 10*3/uL (ref 1.7–7.7)
Neutrophils Relative %: 65 %
Platelets: 184 10*3/uL (ref 150–400)
RBC: 4.61 MIL/uL (ref 3.87–5.11)
RDW: 16.2 % — ABNORMAL HIGH (ref 11.5–15.5)
WBC: 10.2 10*3/uL (ref 4.0–10.5)
nRBC: 0.2 % (ref 0.0–0.2)

## 2021-07-13 LAB — COMPREHENSIVE METABOLIC PANEL
ALT: 17 U/L (ref 0–44)
AST: 21 U/L (ref 15–41)
Albumin: 3.2 g/dL — ABNORMAL LOW (ref 3.5–5.0)
Alkaline Phosphatase: 42 U/L (ref 38–126)
Anion gap: 9 (ref 5–15)
BUN: 23 mg/dL (ref 8–23)
CO2: 22 mmol/L (ref 22–32)
Calcium: 8.8 mg/dL — ABNORMAL LOW (ref 8.9–10.3)
Chloride: 103 mmol/L (ref 98–111)
Creatinine, Ser: 1.11 mg/dL — ABNORMAL HIGH (ref 0.44–1.00)
GFR, Estimated: 51 mL/min — ABNORMAL LOW (ref >60)
Glucose, Bld: 189 mg/dL — ABNORMAL HIGH (ref 70–99)
Potassium: 3.8 mmol/L (ref 3.5–5.1)
Sodium: 134 mmol/L — ABNORMAL LOW (ref 135–145)
Total Bilirubin: 1 mg/dL (ref 0.3–1.2)
Total Protein: 7 g/dL (ref 6.5–8.1)

## 2021-07-13 LAB — RESP PANEL BY RT-PCR (FLU A&B, COVID) ARPGX2
Influenza A by PCR: NEGATIVE
Influenza B by PCR: NEGATIVE
SARS Coronavirus 2 by RT PCR: NEGATIVE

## 2021-07-13 LAB — BRAIN NATRIURETIC PEPTIDE: B Natriuretic Peptide: 262.3 pg/mL — ABNORMAL HIGH (ref 0.0–100.0)

## 2021-07-13 MED ORDER — ONDANSETRON HCL 4 MG/2ML IJ SOLN: 4.0000 mg | Freq: Four times a day (QID) | INTRAMUSCULAR | Status: DC | PRN

## 2021-07-13 MED ORDER — ACETAMINOPHEN 325 MG PO TABS: 650.0000 mg | ORAL_TABLET | Freq: Four times a day (QID) | ORAL | Status: DC | PRN

## 2021-07-13 MED ORDER — ONDANSETRON HCL 4 MG PO TABS: 4.0000 mg | ORAL_TABLET | Freq: Four times a day (QID) | ORAL | Status: DC | PRN

## 2021-07-13 MED ORDER — ACETAMINOPHEN 650 MG RE SUPP: 650.0000 mg | Freq: Four times a day (QID) | RECTAL | Status: DC | PRN

## 2021-07-13 MED ORDER — METHYLPREDNISOLONE SODIUM SUCC 125 MG IJ SOLR
125.0000 mg | Freq: Once | INTRAMUSCULAR | Status: DC
  Filled 2021-07-13: qty 2

## 2021-07-13 NOTE — ED Provider Notes (Signed)
[facility] EMERGENCY DEPARTMENT Provider Note   CSN: [id number] Arrival date & time: 07/13/21  1803     History No chief complaint on file.   Elizabeth Melton is a 80 y.o. female hx of DM, HL, HTN, heart failure, interstitial lung disease, here presenting with shortness of breath and hypoxia.  Patient is on 3 L nasal cannula at baseline.  Patient apparently started desatting to 70 % when Elizabeth Melton ambulates to the bathroom.  Patient had extensive work-up already.  Elizabeth Melton had a high-resolution CT about a month ago.  Elizabeth Melton then had right and left heart cath last week.  Patient is already on Lasix and her weight is baseline.  Elizabeth Melton called her doctor and was sent in for admission.  There was a consideration that Elizabeth Melton should increase her Lasix as well  The history is provided by the patient.      Past Medical History:  Diagnosis Date   Acute on chronic systolic (congestive) heart failure ([place]) 05/19/2021   Diabetes mellitus without complication (HCC)    Hyperlipidemia    Hypertension    Hypothyroidism    Thrombocytopenia ([place])     Patient Active Problem List   Diagnosis Date Noted   Chronic diastolic heart failure ([place]) 07/05/2021   Pulmonary hypertension (HCC)    Acute on chronic systolic (congestive) heart failure ([place]) 05/19/2021   Acute on chronic respiratory failure with hypoxia ([place]) 05/19/2021   SOB (shortness of breath) 05/19/2021   AF (paroxysmal atrial fibrillation) ([place]) 05/19/2021   Acute on chronic congestive heart failure (HCC)    Atrial fibrillation with RVR ([place])    Demand ischemia ([place])    Pneumonia due to COVID-19 virus 01/16/2021   Acute respiratory failure with hypoxia ([place]) 09/06/2020   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   ILD (interstitial lung disease) ([place]) 10/23/2019   Chronic respiratory failure with hypoxia ([place]) 10/23/2019   Essential (primary) hypertension 09/18/2018   PAD (peripheral artery disease) ([place]) 09/18/2018   Tobacco  abuse 09/18/2018   Aortic regurgitation 09/18/2018   Hypothyroidism    Hyperlipidemia    Diabetes mellitus without complication ([place])    Lower extremity pain 09/24/2012    Past Surgical History:  Procedure Laterality Date   RIGHT HEART CATH N/A 07/05/2021   Procedure: RIGHT HEART CATH;  Surgeon: [name], MD;  Location: [facility];  Service: Cardiovascular;  Laterality: N/A;   TUBAL LIGATION       OB History   No obstetric history on file.     Family History  Problem Relation Age of Onset   Diabetes Mother    Kidney disease Mother    Hypertension Mother    Other Father        tuberculosis   Hypertension Sister    Hyperlipidemia Sister     Social History   Tobacco Use   Smoking status: Former    Packs/day: 0.75    Years: 60.00    Pack years: 45.00    Types: Cigarettes    Quit date: 05/11/2021    Years since quitting: 0.1   Smokeless tobacco: Former    Quit date: 09/24/1962  Vaping Use   Vaping Use: Never used  Substance Use Topics   Alcohol use: Yes   Drug use: No    Home Medications Prior to Admission medications   Medication Sig Start Date End Date Taking? Authorizing Provider  acetaminophen (TYLENOL) 500 MG tablet Take 500-1,000 mg by mouth every 8 (  eight) hours as needed for headache or mild pain (pain).    [provider]  apixaban (ELIQUIS) 5 MG TABS tablet Take 1 tablet (5 mg total) by mouth 2 (two) times daily. 10/11/20   Mercy Riding, MD  b complex vitamins tablet Take 1 tablet by mouth daily.    [provider]  Blood Glucose Monitoring Suppl (TRUE METRIX METER) w/Device KIT 1 each by Other route in the morning and at bedtime. 12/25/20   [provider]  Calcium Carb-Cholecalciferol (CALCIUM + D3) 600-200 MG-UNIT TABS Take 2 tablets by mouth daily.     [provider]  cholecalciferol (VITAMIN D3) 25 MCG (1000 UT) tablet Take 1,000 Units by mouth daily.    [provider]  furosemide (LASIX)  20 MG tablet Take 40 mg by mouth daily.    [provider]  furosemide (LASIX) 40 MG tablet Take 1 tablet (40 mg total) by mouth daily. 05/23/21 07/04/21  [name], MD  glucose blood test strip 1 each by Other route as needed for other (blood sugar).    [provider]  insulin aspart (NOVOLOG) 100 UNIT/ML FlexPen Insulin sliding scale: Blood sugar  120-150   3units                       151-200   4units                       201-250   7units                       251- 300  11units                       301-350   15uints                       351-400   20units                       >400         call MD immediately Patient taking differently: Inject 10 Units into the skin 3 (three) times daily with meals. 05/22/21   [name], MD  insulin glargine (LANTUS) 100 UNIT/ML Solostar Pen Inject 10 Units into the skin daily for 10 days. Patient taking differently: Inject 10 Units into the skin at bedtime. 05/22/21 07/05/21  [name], MD  Insulin Pen Needle (PEN NEEDLES 29GX1/2") 29G X 12MM MISC For insulin injection 05/22/21   [name], MD  levothyroxine (SYNTHROID, LEVOTHROID) 75 MCG tablet Take 75 mcg by mouth daily before breakfast.     [provider]  lovastatin (MEVACOR) 40 MG tablet Take 40 mg by mouth daily.    [provider]  metoprolol succinate (TOPROL-XL) 25 MG 24 hr tablet Take 0.5 tablets (12.5 mg total) by mouth daily. Patient taking differently: Take 12.5 mg by mouth in the morning and at bedtime. 01/28/21   [name], MD  nicotine (NICODERM CQ - DOSED IN MG/24 HOURS) 21 mg/24hr patch Place 21 mg onto the skin daily.    [provider]  Omega-3 Fatty Acids (FISH OIL PO) Take 1 capsule by mouth daily.    [provider]  potassium chloride (KLOR-CON) 10 MEQ tablet Take 1 tablet (10 mEq total) by mouth daily. 05/22/21   [name], MD  predniSONE (DELTASONE) 10 MG tablet Take 2  tab 20 mg daily for 7 days, then take 10 mg daily. 06/28/21    [name], [name], MD  senna-docusate (SENOKOT-S) 8.6-50 MG tablet Take 1 tablet by mouth at bedtime as needed for mild constipation. 05/22/21   [name], MD  SMART SENSE THIN LANCETS 26G MISC 1 each by Does not apply route 2 (two) times daily.    [provider]  Tiotropium Bromide-Olodaterol (STIOLTO RESPIMAT) 2.5-2.5 MCG/ACT AERS Inhale 2 puffs into the lungs daily. 10/19/20   Brand Males, MD    Allergies    Losartan  Review of Systems   Review of Systems  Respiratory:  Positive for shortness of breath.   All other systems reviewed and are negative.  Physical Exam Updated Vital Signs BP (!) 131/106   Pulse (!) 57   Temp 98.6 F (37 C)   Resp 20   SpO2 98%   Physical Exam Vitals and nursing note reviewed.  HENT:     Head: Normocephalic.  Eyes:     Extraocular Movements: Extraocular movements intact.     Pupils: Pupils are equal, round, and reactive to light.  Cardiovascular:     Rate and Rhythm: Normal rate and regular rhythm.     Pulses: Normal pulses.     Heart sounds: Normal heart sounds.  Pulmonary:     Comments: Dry crackles bilaterally Abdominal:     General: Abdomen is flat.     Palpations: Abdomen is soft.  Musculoskeletal:        General: Normal range of motion.     Cervical back: Normal range of motion and neck supple.     Comments: Trace edema   Skin:    Capillary Refill: Capillary refill takes less than 2 seconds.  Neurological:     General: No focal deficit present.     Mental Status: Elizabeth Melton is alert and oriented to person, place, and time.  Psychiatric:        Mood and Affect: Mood normal.        Behavior: Behavior normal.    ED Results / Procedures / Treatments   Labs (all labs ordered are listed, but only abnormal results are displayed) Labs Reviewed  CBC WITH DIFFERENTIAL/PLATELET - Abnormal; Notable for the following components:      Result Value   Hemoglobin 15.3 (*)    HCT 47.5 (*)    MCV 103.0 (*)    RDW 16.2 (*)     All other components within normal limits  COMPREHENSIVE METABOLIC PANEL - Abnormal; Notable for the following components:   Sodium 134 (*)    Glucose, Bld 189 (*)    Creatinine, Ser 1.11 (*)    Calcium 8.8 (*)    Albumin 3.2 (*)    GFR, Estimated 51 (*)    All other components within normal limits  BRAIN NATRIURETIC PEPTIDE - Abnormal; Notable for the following components:   B Natriuretic Peptide 262.3 (*)    All other components within normal limits  RESP PANEL BY RT-PCR (FLU A&B, COVID) ARPGX2    EKG None  Radiology DG Chest 2 View  Result Date: 07/13/2021 CLINICAL DATA:  Shortness of breath. EXAM: CHEST - 2 VIEW COMPARISON:  05/18/2021 FINDINGS: The heart is mildly enlarged but stable. Stable tortuosity and calcification of the thoracic aorta. Underlying emphysematous changes and interstitial lung disease with suspected superimposed interstitial process. This could represent interstitial edema superimposed bronchitis. No definite pleural effusions. No focal infiltrates. No pneumothorax. IMPRESSION: Underlying emphysematous changes and interstitial lung  disease with suspected superimposed interstitial edema or bronchitis. Electronically Signed   By: Marijo Sanes M.D.   On: 07/13/2021 19:38    Procedures Procedures   Medications Ordered in ED Medications  methylPREDNISolone sodium succinate (SOLU-MEDROL) 125 mg/2 mL injection 125 mg (has no administration in time range)    ED Course  I have reviewed the triage vital signs and the nursing notes.  Pertinent labs & imaging results that were available during my care of the patient were reviewed by me and considered in my medical decision making (see chart for details).    MDM Rules/Calculators/A&P                          BRANDILYNN TAORMINA is a 80 y.o. female here with shortness of breath.  Patient is on 3 L nasal cannula at baseline.  Her oxygen level is normal on her 3 L. I reviewed her extensive chart from the past month. I  discussed case with cardiology, Dr. Conley Canal. He states that patient's BNP is less than usual and has no signs of CHF, patient doesn't need increase lasix. I discussed case with pulmonologist on call, who recommend CT noncontrast and pulmonology to see in AM. He recommend hospitalist admission and pulmonary can help decide on steroid dosing in AM      Final Clinical Impression(s) / ED Diagnoses Final diagnoses:  None    Rx / DC Orders ED Discharge Orders     None        Drenda Freeze, MD 07/13/21 2315

## 2021-07-13 NOTE — ED Provider Notes (Signed)
Emergency Medicine Provider Triage Evaluation Note  Elizabeth Melton , a 80 y.o. female  was evaluated in triage.  Pt complains of shortness of breath and hypoxia.  Patient states she has had increased shortness of breath with exertion.  She states that when she walks, her oxygen dropped to the 70s, even when on oxygen.  She wears 3 L at baseline since October 2021.  No fever or chest pain.  No change in cough. Per chart review, pt was recommended to come to the ER for evaluation of possible chf and admission ofr iv lasix  Review of Systems  Positive: sob Negative: cp  Physical Exam  BP (!) 136/110 (BP Location: Right Arm)   Pulse 79   Temp 98.6 F (37 C)   Resp 18   SpO2 95%  Gen:   Awake, no distress   Resp:  Normal effort, rales in bilateral bases MSK:   Moves extremities without difficulty. Mild bilateral pittign edema.    Medical Decision Making  Medically screening exam initiated at 7:00 PM.  Appropriate orders placed.  Jasiya L Maclachlan was informed that the remainder of the evaluation will be completed by another provider, this initial triage assessment does not replace that evaluation, and the importance of remaining in the ED until their evaluation is complete.  Labs, eks, cxr   [place], [place], PA-C 07/13/21 Mercy Moore, MD 07/13/21 [phone]

## 2021-07-13 NOTE — ED Triage Notes (Signed)
Patient arrived by POV and states that she was told her MD told her to come to ED for possible infusion that will help her with her low oxygen levels. Patient denies pain. No SOB. Alert and oriented

## 2021-07-13 NOTE — Telephone Encounter (Signed)
Called and spoke with pt letting her know the info and recs stated by Dr. [name]. When I stated to pt that the recommendations per Dr. [name] was for her to take one 91m lasix in the morning and then take another 481mlasix in the evening, pt did not like hearing that.  Pt said if she had to take the lasix twice a day, she would then get to where she would not be able to leave the house due to needing to go to the bathroom all the time.   Asked pt if she took her lasix every day as prescribed and pt said that she takes her lasix religiously every day.  Pt wanted to know if there were any different recommendations that her cardiologist might be able to suggest. Pt has an upcoming appt scheduled with Dr. ScGardiner Rhymecardiology but that is not until 09/20/21. Stated to pt that I would route this encounter to Dr. ScGardiner Rhymeo see if he could give any different recommendations or if he agreed with the recs stated by Dr. RaChase Calleror pt to take one 4061masix in the AM and an additional 15m49msix in the PM.   Dr. SchuGardiner Rhymeease advise.

## 2021-07-13 NOTE — Telephone Encounter (Signed)
Pt returning a phone call. Pt can be reached at [phone]

## 2021-07-13 NOTE — Telephone Encounter (Signed)
Called and spoke with pt letting her know the recs stated by her cardiologist, Dr. Gardiner Rhyme and that he recommended for her to go to the ED as she might require an admission. Pt verbalized understanding. Nothing further needed.

## 2021-07-13 NOTE — ED Notes (Signed)
MRI   Ezequiel Essex, MD 07/13/21 2325

## 2021-07-13 NOTE — Telephone Encounter (Signed)
Pt returning a phone call. Pt can be reached at [phone].

## 2021-07-13 NOTE — Progress Notes (Signed)
Brief pulmonary note:  Contacted by ED provider.  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  Recent right heart cath 7/26 with significantly mean PA pressure 37 and elevated pulmonary wedge pressure 28 and PVR < 3.  She reports worsening desaturation to the high 70s from usually 80s with exertion.  This prompted call to cardiologist and pulmonary doctor.  They recommended going to the hospital for evaluation given degree of desaturation.  Per ED provider, patient does have worsening dyspnea.  Etiology for dyspnea and progressive hypoxemia is unclear.  Certainly her baseline dyspnea and hypoxemia is likely related to interstitial lung disease, query if there is a contribution of volume overload but contributes as well based on elevated wedge pressure.  Notably BNP is chronically elevated.  Check today is elevated although not as high as values in the past.  For further evaluation agree with admission to the hospital.  Recommend CT chest without contrast to evaluate pulmonary parenchyma to see if there is evidence of exacerbation or flare of ILD versus just progression of fibrosis versus pulmonary edema versus other alveolar filling process.  Okay with Solu-Medrol dose this evening.  Would hold further steroid doses until after images obtained in formal pulmonary evaluation in the morning.

## 2021-07-13 NOTE — Telephone Encounter (Signed)
Given O2 dropping to 70s, would recommend going to ED.  I think she will need admitted for IV diuresis.

## 2021-07-13 NOTE — ED Notes (Signed)
Attempted IV start x2, unsuccessful attempts.

## 2021-07-13 NOTE — Telephone Encounter (Signed)
ATC patient. LVMTCB. 

## 2021-07-14 ENCOUNTER — Encounter (HOSPITAL_COMMUNITY): Payer: Self-pay | Admitting: Internal Medicine

## 2021-07-14 DIAGNOSIS — F17200 Nicotine dependence, unspecified, uncomplicated: Secondary | ICD-10-CM | POA: Diagnosis not present

## 2021-07-14 DIAGNOSIS — I2729 Other secondary pulmonary hypertension: Secondary | ICD-10-CM

## 2021-07-14 DIAGNOSIS — Z833 Family history of diabetes mellitus: Secondary | ICD-10-CM | POA: Diagnosis not present

## 2021-07-14 DIAGNOSIS — J432 Centrilobular emphysema: Secondary | ICD-10-CM

## 2021-07-14 DIAGNOSIS — F17210 Nicotine dependence, cigarettes, uncomplicated: Secondary | ICD-10-CM | POA: Diagnosis present

## 2021-07-14 DIAGNOSIS — I5032 Chronic diastolic (congestive) heart failure: Secondary | ICD-10-CM

## 2021-07-14 DIAGNOSIS — Z83438 Family history of other disorder of lipoprotein metabolism and other lipidemia: Secondary | ICD-10-CM | POA: Diagnosis not present

## 2021-07-14 DIAGNOSIS — Z20822 Contact with and (suspected) exposure to covid-19: Secondary | ICD-10-CM | POA: Diagnosis present

## 2021-07-14 DIAGNOSIS — I50813 Acute on chronic right heart failure: Secondary | ICD-10-CM

## 2021-07-14 DIAGNOSIS — I5023 Acute on chronic systolic (congestive) heart failure: Secondary | ICD-10-CM | POA: Diagnosis present

## 2021-07-14 DIAGNOSIS — I480 Paroxysmal atrial fibrillation: Secondary | ICD-10-CM

## 2021-07-14 DIAGNOSIS — J84112 Idiopathic pulmonary fibrosis: Secondary | ICD-10-CM

## 2021-07-14 DIAGNOSIS — J9611 Chronic respiratory failure with hypoxia: Secondary | ICD-10-CM

## 2021-07-14 DIAGNOSIS — E039 Hypothyroidism, unspecified: Secondary | ICD-10-CM | POA: Diagnosis present

## 2021-07-14 DIAGNOSIS — Z8249 Family history of ischemic heart disease and other diseases of the circulatory system: Secondary | ICD-10-CM | POA: Diagnosis not present

## 2021-07-14 DIAGNOSIS — I110 Hypertensive heart disease with heart failure: Secondary | ICD-10-CM | POA: Diagnosis present

## 2021-07-14 DIAGNOSIS — E785 Hyperlipidemia, unspecified: Secondary | ICD-10-CM | POA: Diagnosis present

## 2021-07-14 DIAGNOSIS — J9621 Acute and chronic respiratory failure with hypoxia: Secondary | ICD-10-CM | POA: Diagnosis present

## 2021-07-14 DIAGNOSIS — I10 Essential (primary) hypertension: Secondary | ICD-10-CM

## 2021-07-14 DIAGNOSIS — T380X5A Adverse effect of glucocorticoids and synthetic analogues, initial encounter: Secondary | ICD-10-CM | POA: Diagnosis present

## 2021-07-14 DIAGNOSIS — Z7952 Long term (current) use of systemic steroids: Secondary | ICD-10-CM | POA: Diagnosis not present

## 2021-07-14 DIAGNOSIS — Z888 Allergy status to other drugs, medicaments and biological substances status: Secondary | ICD-10-CM | POA: Diagnosis not present

## 2021-07-14 DIAGNOSIS — Z841 Family history of disorders of kidney and ureter: Secondary | ICD-10-CM | POA: Diagnosis not present

## 2021-07-14 DIAGNOSIS — J849 Interstitial pulmonary disease, unspecified: Secondary | ICD-10-CM

## 2021-07-14 DIAGNOSIS — E119 Type 2 diabetes mellitus without complications: Secondary | ICD-10-CM

## 2021-07-14 DIAGNOSIS — I2722 Pulmonary hypertension due to left heart disease: Secondary | ICD-10-CM | POA: Diagnosis present

## 2021-07-14 DIAGNOSIS — Z7901 Long term (current) use of anticoagulants: Secondary | ICD-10-CM | POA: Diagnosis not present

## 2021-07-14 DIAGNOSIS — I2723 Pulmonary hypertension due to lung diseases and hypoxia: Secondary | ICD-10-CM | POA: Diagnosis present

## 2021-07-14 DIAGNOSIS — I2782 Chronic pulmonary embolism: Secondary | ICD-10-CM

## 2021-07-14 DIAGNOSIS — Z794 Long term (current) use of insulin: Secondary | ICD-10-CM | POA: Diagnosis not present

## 2021-07-14 LAB — GLUCOSE, CAPILLARY
Glucose-Capillary: 201 mg/dL — ABNORMAL HIGH (ref 70–99)
Glucose-Capillary: 108 mg/dL — ABNORMAL HIGH (ref 70–99)
Glucose-Capillary: 202 mg/dL — ABNORMAL HIGH (ref 70–99)
Glucose-Capillary: 86 mg/dL (ref 70–99)

## 2021-07-14 LAB — CBC
HCT: 44.1 % (ref 36.0–46.0)
Hemoglobin: 14.1 g/dL (ref 12.0–15.0)
MCH: 32.9 pg (ref 26.0–34.0)
MCHC: 32 g/dL (ref 30.0–36.0)
MCV: 103 fL — ABNORMAL HIGH (ref 80.0–100.0)
Platelets: 178 10*3/uL (ref 150–400)
RBC: 4.28 MIL/uL (ref 3.87–5.11)
RDW: 16.1 % — ABNORMAL HIGH (ref 11.5–15.5)
WBC: 8.3 10*3/uL (ref 4.0–10.5)
nRBC: 0 % (ref 0.0–0.2)

## 2021-07-14 LAB — BASIC METABOLIC PANEL
Anion gap: 10 (ref 5–15)
BUN: 20 mg/dL (ref 8–23)
CO2: 23 mmol/L (ref 22–32)
Calcium: 8.9 mg/dL (ref 8.9–10.3)
Chloride: 103 mmol/L (ref 98–111)
Creatinine, Ser: 0.93 mg/dL (ref 0.44–1.00)
GFR, Estimated: >60 mL/min (ref >60)
Glucose, Bld: 190 mg/dL — ABNORMAL HIGH (ref 70–99)
Potassium: 3.9 mmol/L (ref 3.5–5.1)
Sodium: 136 mmol/L (ref 135–145)

## 2021-07-14 LAB — MAGNESIUM: Magnesium: 2.1 mg/dL (ref 1.7–2.4)

## 2021-07-14 MED ORDER — FUROSEMIDE 40 MG PO TABS
40.0000 mg | ORAL_TABLET | Freq: Two times a day (BID) | ORAL | Status: DC
  Administered 2021-07-14: 40 mg via ORAL
  Filled 2021-07-14: qty 1

## 2021-07-14 MED ORDER — VITAMIN D 25 MCG (1000 UNIT) PO TABS
1000.0000 [IU] | ORAL_TABLET | Freq: Every day | ORAL | Status: DC
  Administered 2021-07-14 – 2021-07-15 (×2): 1000 [IU] via ORAL
  Filled 2021-07-14 (×2): qty 1

## 2021-07-14 MED ORDER — INSULIN GLARGINE 100 UNIT/ML ~~LOC~~ SOLN
10.0000 [IU] | Freq: Every day | SUBCUTANEOUS | Status: DC
  Administered 2021-07-14: 10 [IU] via SUBCUTANEOUS
  Filled 2021-07-14 (×2): qty 0.1

## 2021-07-14 MED ORDER — SENNOSIDES-DOCUSATE SODIUM 8.6-50 MG PO TABS
1.0000 | ORAL_TABLET | Freq: Two times a day (BID) | ORAL | Status: DC | PRN
  Administered 2021-07-14: 1 via ORAL
  Filled 2021-07-14: qty 1

## 2021-07-14 MED ORDER — FUROSEMIDE 10 MG/ML IJ SOLN
40.0000 mg | Freq: Two times a day (BID) | INTRAMUSCULAR | Status: DC
  Administered 2021-07-14 – 2021-07-15 (×2): 40 mg via INTRAVENOUS
  Filled 2021-07-14 (×2): qty 4

## 2021-07-14 MED ORDER — LEVOTHYROXINE SODIUM 75 MCG PO TABS
75.0000 ug | ORAL_TABLET | Freq: Every day | ORAL | Status: DC
  Administered 2021-07-14 – 2021-07-15 (×2): 75 ug via ORAL
  Filled 2021-07-14 (×2): qty 1

## 2021-07-14 MED ORDER — PRAVASTATIN SODIUM 40 MG PO TABS
40.0000 mg | ORAL_TABLET | Freq: Every day | ORAL | Status: DC
  Administered 2021-07-14: 40 mg via ORAL
  Filled 2021-07-14: qty 1

## 2021-07-14 MED ORDER — ARFORMOTEROL TARTRATE 15 MCG/2ML IN NEBU
15.0000 ug | INHALATION_SOLUTION | Freq: Two times a day (BID) | RESPIRATORY_TRACT | Status: DC
  Administered 2021-07-14 – 2021-07-15 (×2): 15 ug via RESPIRATORY_TRACT
  Filled 2021-07-14 (×6): qty 2

## 2021-07-14 MED ORDER — APIXABAN 5 MG PO TABS
5.0000 mg | ORAL_TABLET | Freq: Two times a day (BID) | ORAL | Status: DC
  Administered 2021-07-14 – 2021-07-15 (×3): 5 mg via ORAL
  Filled 2021-07-14 (×3): qty 1

## 2021-07-14 MED ORDER — CALCIUM CARBONATE-VITAMIN D 500-200 MG-UNIT PO TABS
2.0000 | ORAL_TABLET | Freq: Every day | ORAL | Status: DC
  Administered 2021-07-14 – 2021-07-15 (×2): 2 via ORAL
  Filled 2021-07-14 (×2): qty 2

## 2021-07-14 MED ORDER — PREDNISONE 10 MG PO TABS
10.0000 mg | ORAL_TABLET | Freq: Every day | ORAL | Status: DC
  Administered 2021-07-14 – 2021-07-15 (×2): 10 mg via ORAL
  Filled 2021-07-14 (×2): qty 1

## 2021-07-14 MED ORDER — POTASSIUM CHLORIDE CRYS ER 10 MEQ PO TBCR
10.0000 meq | EXTENDED_RELEASE_TABLET | Freq: Every day | ORAL | Status: DC
  Administered 2021-07-14 – 2021-07-15 (×2): 10 meq via ORAL
  Filled 2021-07-14 (×2): qty 1

## 2021-07-14 MED ORDER — INSULIN ASPART 100 UNIT/ML IJ SOLN
0.0000 [IU] | Freq: Three times a day (TID) | INTRAMUSCULAR | Status: DC
  Administered 2021-07-14 (×2): 5 [IU] via SUBCUTANEOUS
  Administered 2021-07-15: 3 [IU] via SUBCUTANEOUS
  Administered 2021-07-15: 2 [IU] via SUBCUTANEOUS

## 2021-07-14 MED ORDER — METOPROLOL SUCCINATE ER 25 MG PO TB24
12.5000 mg | ORAL_TABLET | Freq: Every day | ORAL | Status: DC
  Administered 2021-07-14 (×2): 12.5 mg via ORAL
  Filled 2021-07-14 (×2): qty 1

## 2021-07-14 MED ORDER — SENNOSIDES-DOCUSATE SODIUM 8.6-50 MG PO TABS: 1.0000 | ORAL_TABLET | Freq: Every evening | ORAL | Status: DC | PRN

## 2021-07-14 MED ORDER — UMECLIDINIUM BROMIDE 62.5 MCG/INH IN AEPB
1.0000 | INHALATION_SPRAY | Freq: Every day | RESPIRATORY_TRACT | Status: DC
  Administered 2021-07-15: 1 via RESPIRATORY_TRACT
  Filled 2021-07-14 (×2): qty 7

## 2021-07-14 MED ORDER — NICOTINE 14 MG/24HR TD PT24
14.0000 mg | MEDICATED_PATCH | Freq: Every day | TRANSDERMAL | Status: DC
  Administered 2021-07-14 – 2021-07-15 (×2): 14 mg via TRANSDERMAL
  Filled 2021-07-14 (×2): qty 1

## 2021-07-14 NOTE — Progress Notes (Addendum)
PROGRESS NOTE  Brief Narrative: Elizabeth Melton is a 80 y.o. 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 She was directed to the ED though reported feeling ok when she got here. She was admitted early this morning by Dr. Alcario Melton with CT chest and formal pulmonary evaluation pending.  Subjective: States her legs are swollen but better than they've been in the past. Shortness of breath initially stated as at her baseline, but with further probing does consistently admit that the past few weeks have been harder to get around, fixing meals less frequently, etc.   Objective: BP (!) 144/49 (BP Location: Right Arm)   Pulse (!) 53   Temp 97.7 F (36.5 C) (Oral)   Resp 17   Ht _0  (1.676 m)   Wt 73.6 kg   SpO2 95%   BMI 26.20 kg/m   Gen: Nontoxic elderly female in no distress Pulm: On supplemental oxygen, some dyspnea with bed level mobility. Crackles bilaterally with basilar predominance. CV: RRR, no murmur, no JVD, 1+ L > R pitting dependent LE edema. GI: Soft, NT, ND, +BS  Neuro: Alert and oriented. No focal deficits. Skin: No rashes, lesions or ulcers on visualized skin.  Assessment & Plan: Principal Problem:   Acute on chronic respiratory failure with hypoxia (HCC) Active Problems:   Diabetes mellitus without complication (HCC)   Essential (primary) hypertension   ILD (interstitial lung disease) (HCC)   Chronic respiratory failure with hypoxia (HCC)   AF (paroxysmal atrial fibrillation) (HCC)   Chronic diastolic heart failure (HCC)  Acute on chronic hypoxic respiratory failure: IPF, UIP pattern. - Continue baseline prednisone (72m daily current tapering dose) pending PCCM formal consultation. IPF appears stable on CT chest on admission.  - Augment lasix from 443mpo daily at home to 408mV BID. -  Appreciate PCCM input  Pulmonary HTN, elevated wedge pressure noted on RHC. Suspect an element of fluid overload with dependent edema. Echo 05/30/2021 showed LVEF 60-65%, G2DD, elevated LVEDP. RV enlarged with low normal systolic function. Mod LAE, mod RAE, mod MR, mod TR, mild-mod AR, mild AS.  - D/w pt at length and with PCCM. Will start lasix 40 mg IV BID and monitor UOP, weight, and BMP in AM.   PAF, HTN: NSR currently.  - Continue metoprolol and eliquis  T2DM: HbA1c 7%, exacerbated by steroids.  - Continue SSI  Tobacco use:  - Cessation counseling.  [name] Pager on amion 07/14/2021, 12:31 PM

## 2021-07-14 NOTE — Consult Note (Addendum)
NAME:  Elizabeth Melton, MRN:  [id number], DOB:  1941/05/21, LOS: 0 ADMISSION DATE:  07/13/2021, CONSULTATION DATE:  07/14/2021  REFERRING MD:  Alcario Drought, CHIEF COMPLAINT:  ILD   History of present illness   Elizabeth Melton is a 80 y.o. female with PMH of of interstitial pulmonary fibrosis with UIP, pulmonary arterial hypertension, A. fib, diastolic heart failure, HTN, T2DM who presents for evaluation of hypoxia.  Patient reports she has baseline dyspnea on exertion and on 3 L of supplemental oxygen at home. Her DOE has been progressive the last few weeks. Often feels fatigue after minimal activity and short of breath after moderate activity such vacuuming or going up her stairs. She was recently instructed by her cardiologist to increase her lasix to 40 mg twice daily but patient refused due to the inconvenience of the constant urination. She still smokes about 2-3 cigarettes a day but is working on quitting.   She has a home health RN and PT and patient was found to have desatted to the 70s-80s with exertion. She was informed to report to the ED for evaluation. Pulmonology consulted to evaluate her hypoxia. Pt reports she is at her baseline. She reports improvement in her leg swelling but continues to have DOE and some muscle cramps in legs here in the hospital, which are not not new for her. She denies any pleuritic chest pain, palpitations, cough, fevers, leg pain or chills.   Past Medical History  She,  has a past medical history of Acute on chronic diastolic (congestive) heart failure ([place]) (05/19/2021), Diabetes mellitus without complication ([place]), Hyperlipidemia, Hypertension, Hypothyroidism, IPF (idiopathic pulmonary fibrosis) ([place]), and Thrombocytopenia ([place]).   Consults:  Pulmonology  Procedures:  None  Significant Diagnostic Tests:  CXR 8/3: Stable ILD with suspected superimposed interstitial edema or bronchitis CT chest 8/4: Stable IPF findings, centrilobular emphysema, moderate  cardiomegalyand stable PAH changes  Micro Data:  Negative SARS coronavirus  Antimicrobials:  N/A  Objective   Blood pressure (!) 127/52, pulse 60, temperature 97.6 F (36.4 C), temperature source Oral, resp. rate 17, height _0  (1.676 m), weight 73.6 kg, SpO2 95 %.        Intake/Output Summary (Last 24 hours) at 07/14/2021 0854 Last data filed at 07/14/2021 0504 Gross per 24 hour  Intake 120 ml  Output --  Net 120 ml   Filed Weights   07/14/21 0211  Weight: 73.6 kg    Examination: General: Pleasant, well-appearing elderly woman laying in bed. No acute distress. Neck: No JVD.  Supple CV: RRR. No murmurs, rubs, or gallops.  1+ BLE edema Pulmonary: On 4 L [place].  Lungs CTAB. Normal effort.  Some crackles at the bases. Abdominal: Soft, nontender, nondistended. Normal bowel sounds. Extremities: Palpable radial and DP pulses. Normal ROM. Skin: Warm and dry. No obvious rash or lesions. Neuro: A&Ox3. Moves all extremities. Normal sensation. No focal deficit. 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 Mildly elevated BNP (262) with LE swelling and a recent RHC showing PCWP of 28 c/w with acute on  chronic HF causing increased O2 needs. Patient not agreeable to BID lasix at home due to interference with quality of life. She will need increased diurses during this hospitalization and close follow up with her pulmonologist, Dr. [name] in the outpatient.   --Start IV lasix 40 BID --Strict I/O,  daily weights --Nicotine patch --Continue prednisone --Ambulation with pulse Ox to assess need to increase O2 to 4 L --Follow up with ILD clinic, will likely need to start antifibrotic therapy if agreeable. She has scheduled with Dr. [name] already.   - continue eliquis for previous PE.   Patient seen and examined with Dr. Coy Saunas.  I agree with his aforementioned assessment and plan.   Elizabeth Llamas, MD [facility] [place]      Labs   CBC: Recent Labs  Lab 07/13/21 1852 07/14/21 0349  WBC 10.2 8.3  NEUTROABS 6.7  --   HGB 15.3* 14.1  HCT 47.5* 44.1  MCV 103.0* 103.0*  PLT 184 774    Basic Metabolic Panel: Recent Labs  Lab 07/13/21 1852 07/14/21 0349  NA 134* 136  K 3.8 3.9  CL 103 103  CO2 22 23  GLUCOSE 189* 190*  BUN 23 20  CREATININE 1.11* 0.93  CALCIUM 8.8* 8.9   GFR: Estimated Creatinine Clearance: 50.3 mL/min (by C-G formula based on SCr of 0.93 mg/dL). Recent Labs  Lab 07/13/21 1852 07/14/21 0349  WBC 10.2 8.3    Liver Function Tests: Recent Labs  Lab 07/13/21 1852  AST 21  ALT 17  ALKPHOS 42  BILITOT 1.0  PROT 7.0  ALBUMIN 3.2*   No results for input(s): LIPASE, AMYLASE in the last 168 hours. No results for input(s): AMMONIA in the last 168 hours.  ABG    Component Value Date/Time   HCO3 24.0 07/05/2021 1144   TCO2 25 07/05/2021 1144   ACIDBASEDEF 1.0 07/05/2021 1144   O2SAT 61.0 07/05/2021 1144     Coagulation Profile: No results for input(s): INR, PROTIME in the last 168 hours.  Cardiac Enzymes: No results for input(s): CKTOTAL, CKMB, CKMBINDEX, TROPONINI in the last 168 hours.  HbA1C: Hgb A1c MFr Bld  Date/Time Value Ref Range Status  05/20/2021 04:55 AM 7.0 (H) 4.8 - 5.6 % Final    Comment:    (NOTE)         Prediabetes: 5.7 - 6.4         Diabetes: >6.4         Glycemic control for adults with diabetes: <7.0   01/16/2021 04:55 PM 7.6 (H) 4.8 - 5.6 % Final    Comment:     (NOTE) Pre diabetes:          5.7%-6.4%  Diabetes:              >6.4%  Glycemic control for   <7.0% adults with diabetes     CBG: Recent Labs  Lab 07/14/21 0605  GLUCAP 201*    Review of Systems:   As stated in HPI  Surgical History    Past Surgical History:  Procedure Laterality Date   RIGHT HEART CATH N/A 07/05/2021   Procedure: RIGHT HEART CATH;  Surgeon: [name], MD;  Location: [facility];  Service: Cardiovascular;  Laterality: N/A;   TUBAL LIGATION       Social History   reports that she quit smoking about 2 months ago. Her smoking use included cigarettes. She has a 45.00 pack-year smoking history. She quit smokeless  tobacco use about 58 years ago. She reports current alcohol use. She reports that she does not use drugs.   Family History   Her family history includes Diabetes in her mother; Hyperlipidemia in her sister; Hypertension in her mother and sister; Kidney disease in her mother; Other in her father.   Allergies Allergies  Allergen Reactions   Losartan Other (See Comments)    Hallucinations      Home Medications  Prior to Admission medications   Medication Sig Start Date End Date Taking? Authorizing Provider  acetaminophen (TYLENOL) 500 MG tablet Take 500-1,000 mg by mouth every 8 (eight) hours as needed for headache or mild pain (pain).   Yes [provider]  apixaban (ELIQUIS) 5 MG TABS tablet Take 1 tablet (5 mg total) by mouth 2 (two) times daily. 10/11/20  Yes Mercy Riding, MD  b complex vitamins tablet Take 1 tablet by mouth daily.   Yes [provider]  Blood Glucose Monitoring Suppl (TRUE METRIX METER) w/Device KIT 1 each by Other route in the morning and at bedtime. 12/25/20  Yes [provider]  Calcium Carb-Cholecalciferol (CALCIUM + D3) 600-200 MG-UNIT TABS Take 2 tablets by mouth daily.    Yes [provider]  cholecalciferol (VITAMIN D3) 25 MCG (1000 UT) tablet Take 1,000 Units by mouth  daily.   Yes [provider]  furosemide (LASIX) 40 MG tablet Take 1 tablet (40 mg total) by mouth daily. 05/23/21 07/14/21 Yes [name], MD  glucose blood test strip 1 each by Other route as needed for other (blood sugar).   Yes [provider]  insulin aspart (NOVOLOG) 100 UNIT/ML FlexPen Insulin sliding scale: Blood sugar  120-150   3units                       151-200   4units                       201-250   7units                       251- 300  11units                       301-350   15uints                       351-400   20units                       >400         call MD immediately Patient taking differently: Inject 0-15 Units into the skin daily before breakfast. 05/22/21  Yes [name], MD  insulin glargine (LANTUS) 100 UNIT/ML Solostar Pen Inject 10 Units into the skin daily for 10 days. Patient taking differently: Inject 10 Units into the skin at bedtime. 05/22/21 07/14/21 Yes [name], MD  Insulin Pen Needle (PEN NEEDLES 29GX1/2") 29G X 12MM MISC For insulin injection 05/22/21  Yes [name], MD  levothyroxine (SYNTHROID, LEVOTHROID) 75 MCG tablet Take 75 mcg by mouth daily before breakfast.    Yes [provider]  lovastatin (MEVACOR) 40 MG tablet Take 40 mg by mouth at bedtime.   Yes [provider]  metoprolol succinate (TOPROL-XL) 25 MG 24 hr tablet Take 0.5 tablets (12.5 mg total) by mouth daily. Patient taking differently: Take 12.5 mg by mouth at  bedtime. 01/28/21  Yes [name], MD  nicotine (NICODERM CQ - DOSED IN MG/24 HOURS) 14 mg/24hr patch Place 21 mg onto the skin daily.   Yes [provider]  Omega-3 Fatty Acids (FISH OIL PO) Take 1,000 mg by mouth daily.   Yes [provider]  OXYGEN Inhale 3 L into the lungs continuous.   Yes [provider]  potassium chloride (KLOR-CON) 10 MEQ tablet Take 1 tablet (10 mEq total) by mouth daily. 05/22/21  Yes [name], MD  predniSONE (DELTASONE) 10 MG tablet Take 2 tab 20  mg daily for 7 days, then take 10 mg daily. Patient taking differently: Take 10-20 mg by mouth See admin instructions. Take 20 mg daily for 7 days, then take 10 mg daily 06/28/21  Yes [name], [name], MD  senna-docusate (SENOKOT-S) 8.6-50 MG tablet Take 1 tablet by mouth at bedtime as needed for mild constipation. 05/22/21  Yes [name], MD  SMART SENSE THIN LANCETS 26G MISC 1 each by Does not apply route 2 (two) times daily.   Yes [provider]  Tiotropium Bromide-Olodaterol (STIOLTO RESPIMAT) 2.5-2.5 MCG/ACT AERS Inhale 2 puffs into the lungs daily. 10/19/20  Yes Brand Males, MD  buPROPion (ZYBAN) 150 MG 12 hr tablet Take 150 mg by mouth every morning. 07/10/21   [provider]    Lacinda Axon [facility] 07/14/2021 8:54 AM  Pager: see amion  If no response to pager , please call critical care on call (see AMION) until 7pm After 7:00 pm call Elink

## 2021-07-14 NOTE — H&P (Signed)
History and Physical    Elizabeth Melton:[id number] DOB: 12/27/1940 DOA: 07/13/2021  PCP: Kathyrn Lass, MD  Patient coming from: Home  I have personally briefly reviewed patient's old medical records in [place]  Chief Complaint: Hypoxia  HPI: Elizabeth Melton is a 80 y.o. 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  Pt wears 3L O2 via [place] at baseline, though despite this she will typically desat with exertion.  Looks like there was concern that her desats with exertion were too severe (72% despite O2) over the past few days.  Initially pulmonology and cards recd increasing her lasix to 58m PO BID at home, though pt didn't want to do this due to frequency of urination this would cause.  Ultimately due to concern for low O2 sats, pt was sent in to ED today.  Pt actually reports she feels at baseline with regards to symptoms and dyspnea.  Says she is no more short of breath over the past couple of days than usual.  She says it isnt uncommon for her to desaturate with exertion like this.  Pt also denies leg swelling any worse than baseline.  Says leg swelling is actually better than prior to starting lasix and increasing to 485mdaily.  No fevers, chills.   ED Course: CT chest = severe PAH, IPF with UIP, but these findings essentially unchanged from July 1st.  Pt still smoking occasionally despite nicotine patch (last cigarette was 2 days ago).  Hasnt started Welbutrin yet.   Review of Systems: As per HPI, otherwise all review of systems negative.  Past Medical History:  Diagnosis Date   Acute on chronic diastolic (congestive) heart failure ([facility]   Diabetes mellitus without complication (HCC)    Hyperlipidemia    Hypertension    Hypothyroidism    IPF (idiopathic pulmonary fibrosis) ([facility]   Thrombocytopenia (HCC)     Past  Surgical History:  Procedure Laterality Date   RIGHT HEART CATH N/A 07/05/2021   Procedure: RIGHT HEART CATH;  Surgeon: HaLeonie ManMD;  [facility];  Service: Cardiovascular;  Laterality: N/A;   TUBAL LIGATION       reports that she quit smoking about 2 months ago. Her smoking use included cigarettes. She has a 45.00 pack-year smoking history. She quit smokeless tobacco use about 58 years ago. She reports current alcohol use. She reports that she does not use drugs.  Allergies  Allergen Reactions   Losartan Other (See Comments)    Hallucinations     Family History  Problem Relation Age of Onset   Diabetes Mother    Kidney disease Mother    Hypertension Mother    Other Father        tuberculosis   Hypertension Sister    Hyperlipidemia Sister      Prior to Admission medications   Medication Sig Start Date End Date Taking? Authorizing Provider  acetaminophen (TYLENOL) 500 MG tablet Take 500-1,000 mg by mouth every 8 (eight) hours as needed for headache or mild pain (pain).   Yes [provider]  apixaban (ELIQUIS) 5 MG TABS tablet Take 1 tablet (5 mg total) by mouth 2 (two) times daily. 10/11/20  Yes GoMercy RidingMD  b complex vitamins tablet Take 1 tablet by mouth daily.   Yes [provider]  Blood Glucose Monitoring Suppl (TRUE METRIX METER) w/Device KIT 1 each by Other route in the morning and at bedtime. 12/25/20  Yes [provider]  Calcium Carb-Cholecalciferol (CALCIUM + D3) 600-200 MG-UNIT TABS Take 2 tablets by mouth daily.    Yes [provider]  cholecalciferol (VITAMIN D3) 25 MCG (1000 UT) tablet Take 1,000 Units by mouth daily.   Yes [provider]  furosemide (LASIX) 40 MG tablet Take 1 tablet (40 mg total) by mouth daily. 05/23/21 07/14/21 Yes [name], MD  glucose blood test strip 1 each by Other route as needed for other (blood sugar).   Yes [provider]  insulin aspart (NOVOLOG) 100  UNIT/ML FlexPen Insulin sliding scale: Blood sugar  120-150   3units                       151-200   4units                       201-250   7units                       251- 300  11units                       301-350   15uints                       351-400   20units                       >400         call MD immediately Patient taking differently: Inject 0-15 Units into the skin daily before breakfast. 05/22/21  Yes [name], MD  insulin glargine (LANTUS) 100 UNIT/ML Solostar Pen Inject 10 Units into the skin daily for 10 days. Patient taking differently: Inject 10 Units into the skin at bedtime. 05/22/21 07/14/21 Yes [name], MD  Insulin Pen Needle (PEN NEEDLES 29GX1/2") 29G X 12MM MISC For insulin injection 05/22/21  Yes [name], MD  levothyroxine (SYNTHROID, LEVOTHROID) 75 MCG tablet Take 75 mcg by mouth daily before breakfast.    Yes [provider]  lovastatin (MEVACOR) 40 MG tablet Take 40 mg by mouth at bedtime.   Yes [provider]  metoprolol succinate (TOPROL-XL) 25 MG 24 hr tablet Take 0.5 tablets (12.5 mg total) by mouth daily. Patient taking differently: Take 12.5 mg by mouth at bedtime. 01/28/21  Yes [name], MD  nicotine (NICODERM CQ - DOSED IN MG/24 HOURS) 14 mg/24hr patch Place 21 mg onto the skin daily.   Yes [provider]  Omega-3 Fatty Acids (FISH OIL PO) Take 1,000 mg by mouth daily.   Yes [provider]  OXYGEN Inhale 3 L into the lungs continuous.   Yes [provider]  potassium chloride (KLOR-CON) 10 MEQ tablet Take 1 tablet (10 mEq total) by mouth daily. 05/22/21  Yes [name], MD  predniSONE (DELTASONE) 10 MG tablet Take 2 tab 20 mg daily for 7 days, then take 10 mg daily. Patient taking differently: Take 10-20 mg by mouth See admin instructions. Take 20 mg daily for 7 days, then take 10 mg daily 06/28/21  Yes [name], [name], MD  senna-docusate (SENOKOT-S) 8.6-50 MG tablet Take 1 tablet by mouth at bedtime as  needed for mild constipation. 05/22/21  Yes [name], MD  SMART  SENSE THIN LANCETS 26G MISC 1 each by Does not apply route 2 (two) times daily.   Yes [provider]  Tiotropium Bromide-Olodaterol (STIOLTO RESPIMAT) 2.5-2.5 MCG/ACT AERS Inhale 2 puffs into the lungs daily. 10/19/20  Yes Brand Males, MD  buPROPion (ZYBAN) 150 MG 12 hr tablet Take 150 mg by mouth every morning. 07/10/21   [provider]    Physical Exam: Vitals:   07/13/21 2207 07/13/21 2230 07/13/21 2245 07/13/21 2300  BP: (!) 137/49 (!) 131/106 (!) 127/49 (!) 146/47  Pulse: (!) 59 (!) 57 (!) 54 (!) 52  Resp: _0 Temp:      SpO2: 95% 98% 98% 97%    Constitutional: NAD, calm, comfortable Eyes: PERRL, lids and conjunctivae normal ENMT: Mucous membranes are moist. Posterior pharynx clear of any exudate or lesions.Normal dentition.  Neck: normal, supple, no masses, no thyromegaly Respiratory: Fine crackles bilaterally Cardiovascular: Regular rate and rhythm, no murmurs / rubs / gallops. 2+ edema. 2+ pedal pulses. No carotid bruits.  Abdomen: no tenderness, no masses palpated. No hepatosplenomegaly. Bowel sounds positive.  Musculoskeletal: no clubbing / cyanosis. No joint deformity upper and lower extremities. Good ROM, no contractures. Normal muscle tone.  Skin: no rashes, lesions, ulcers. No induration Neurologic: CN 2-12 grossly intact. Sensation intact, DTR normal. Strength 5/5 in all 4.  Psychiatric: Normal judgment and insight. Alert and oriented x 3. Normal mood.    Labs on Admission: I have personally reviewed following labs and imaging studies  CBC: Recent Labs  Lab 07/13/21 1852  WBC 10.2  NEUTROABS 6.7  HGB 15.3*  HCT 47.5*  MCV 103.0*  PLT 825   Basic Metabolic Panel: Recent Labs  Lab 07/13/21 1852  NA 134*  K 3.8  CL 103  CO2 22  GLUCOSE 189*  BUN 23  CREATININE 1.11*  CALCIUM 8.8*   GFR: Estimated Creatinine Clearance: 42.6 mL/min (A) (by C-G formula based  on SCr of 1.11 mg/dL (H)). Liver Function Tests: Recent Labs  Lab 07/13/21 1852  AST 21  ALT 17  ALKPHOS 42  BILITOT 1.0  PROT 7.0  ALBUMIN 3.2*   No results for input(s): LIPASE, AMYLASE in the last 168 hours. No results for input(s): AMMONIA in the last 168 hours. Coagulation Profile: No results for input(s): INR, PROTIME in the last 168 hours. Cardiac Enzymes: No results for input(s): CKTOTAL, CKMB, CKMBINDEX, TROPONINI in the last 168 hours. BNP (last 3 results) No results for input(s): PROBNP in the last 8760 hours. HbA1C: No results for input(s): HGBA1C in the last 72 hours. CBG: No results for input(s): GLUCAP in the last 168 hours. Lipid Profile: No results for input(s): CHOL, HDL, LDLCALC, TRIG, CHOLHDL, LDLDIRECT in the last 72 hours. Thyroid Function Tests: No results for input(s): TSH, T4TOTAL, FREET4, T3FREE, THYROIDAB in the last 72 hours. Anemia Panel: No results for input(s): VITAMINB12, FOLATE, FERRITIN, TIBC, IRON, RETICCTPCT in the last 72 hours. Urine analysis:    Component Value Date/Time   COLORURINE AMBER (A) 01/25/2021 1700   APPEARANCEUR CLOUDY (A) 01/25/2021 1700   LABSPEC 1.027 01/25/2021 1700   PHURINE 6.0 01/25/2021 1700   GLUCOSEU NEGATIVE 01/25/2021 1700   HGBUR MODERATE (A) 01/25/2021 1700   BILIRUBINUR NEGATIVE 01/25/2021 1700   KETONESUR NEGATIVE 01/25/2021 1700   PROTEINUR 30 (A) 01/25/2021 1700   NITRITE NEGATIVE 01/25/2021 1700   LEUKOCYTESUR TRACE (A) 01/25/2021 1700    Radiological Exams on Admission: DG Chest 2 View  Result Date: 07/13/2021 CLINICAL DATA:  Shortness of breath. EXAM: CHEST - 2 VIEW COMPARISON:  05/18/2021 FINDINGS: The heart is mildly enlarged but stable. Stable tortuosity and calcification of the thoracic aorta. Underlying emphysematous changes and interstitial lung disease with suspected superimposed interstitial process. This could represent interstitial edema superimposed bronchitis. No definite pleural  effusions. No focal infiltrates. No pneumothorax. IMPRESSION: Underlying emphysematous changes and interstitial lung disease with suspected superimposed interstitial edema or bronchitis. Electronically Signed   By: Marijo Sanes M.D.   On: 07/13/2021 19:38   CT Chest Wo Contrast  Result Date: 07/14/2021 CLINICAL DATA:  Interstitial lung disease EXAM: CT CHEST WITHOUT CONTRAST TECHNIQUE: Multidetector CT imaging of the chest was performed following the standard protocol without IV contrast. COMPARISON:  06/10/2021 FINDINGS: Cardiovascular: Moderate multi-vessel coronary artery calcification is again identified, grossly stable since prior examination. There are extensive calcifications of the aortic valve again identified. Mild global cardiomegaly is stable. No pericardial effusion. The central pulmonary arteries are markedly enlarged in keeping with changes of pulmonary arterial hypertension. Extensive atherosclerotic calcification of the thoracic aorta. No aortic aneurysm. Mediastinum/Nodes: Shotty mediastinal adenopathy within the right paratracheal and prevascular lymph node groups is again identified, stable since prior examination. No pathologically enlarged thoracic adenopathy identified. The esophagus is unremarkable. Lungs/Pleura: Moderate centrilobular and paraseptal emphysema is seen at the lung apices. There is again seen thickening of the peribronchovascular interstitium, traction bronchiectasis, and peripheral architectural distortion demonstrating a a basilar gradient,, similar to prior examination, compatible with UIP type changes of interstitial lung disease. No superimposed ground-glass pulmonary infiltrate. More nodular areas of architectural distortion noted on prior examination, seen on axial image # 75/4 and 86/4 appears stable. No new focal pulmonary nodules or infiltrates. No pneumothorax or pleural effusion. The central airways are widely patent. Upper Abdomen: Cholelithiasis partially  visualized. No acute abnormality. Musculoskeletal: No acute bone abnormality. No lytic or blastic bone lesion is identified. IMPRESSION: Spectrum of findings, as noted above, similar to that noted on prior examination. Findings are consistent with UIP per consensus guidelines: Diagnosis of Idiopathic Pulmonary Fibrosis: An Official ATS/ERS/JRS/ALAT Clinical Practice Guideline. [place], Iss 5, [phone], Aug 11 2017. As noted on prior examination, follow-up examination is recommended in 12 months from the original high-resolution chest CT examination to assess for temporal changes in the appearance of the lung parenchyma (estimated July 2023). Moderate superimposed centrilobular emphysema. Moderate multi-vessel coronary artery calcification. Extensive calcification of the aortic valve leaflets. Echocardiography may be helpful to assess the degree of valvular dysfunction. Stable moderate cardiomegaly. Stable morphologic changes of pulmonary arterial hypertension. Aortic Atherosclerosis (ICD10-I70.0) and Emphysema (ICD10-J43.9). Electronically Signed   By: Fidela Salisbury MD   On: 07/14/2021 00:12    EKG: Independently reviewed.  Assessment/Plan Principal Problem:   Acute on chronic respiratory failure with hypoxia (HCC) Active Problems:   Diabetes mellitus without complication (HCC)   Essential (primary) hypertension   ILD (interstitial lung disease) (HCC)   Chronic respiratory failure with hypoxia (HCC)   AF (paroxysmal atrial fibrillation) (HCC)   Chronic diastolic heart failure (HCC)    Acute on chronic resp failure with hypoxia - In setting of IPF with UIP and PAH with elevated wedge pressure. Not clear how much worse than baseline patient actually is today and over past couple of days. Does desat into 70s with any exertion despite 3L Satting 95% on 3L at rest though Will increase Lasix to 63m PO BID while here in hospital (though pt doesn't want to take more than 428m  once a day at home due to frequency of urination). Some of her retention may also be due to the steroid taper. Subjectively says edema is significantly improved actually. Continue steroid taper (now 70m daily for next 10 days, then off steroids). Cont pulse ox Pulm consult in AM IPF with UIP - Pt is likely a candidate for anti fibrotic's, see Dr. RChase Callernote [facility] of course [facility]- Pt may be a candidate for inhaled Tyvaso Had RHC done just this past week. Defer starting of this to Cards A.Fib - Cont eliquis Cont toprol Seems to be maintaining sinus rhythm despite being off of amiodarone. DM2 - Cont Lantus Mod scale SSI AC (looks like shes only taking Novolog once a day SSI at home). Only on insulins recently in setting of steroids. HTN - Cont metoprolol  DVT prophylaxis: Eliquis Code Status: Full Family Communication: No family in room Disposition Plan: Home after pulm evaluation in AM if pulm feels she is stable Consults called: Pulm Admission status: Place in o46    GARDNER, JJugtownHospitalists  How to contact the [facility] DisordersAttending or Consulting provider [place] covering provider during after hours [place] for this patient?  Check the care team in [facility]and look for a) attending/consulting TRH provider listed and b) the TParksideteam listed Log into [email or web address]  Amion Physician Scheduling and messaging for groups and whole hospitals  On call and physician scheduling software for group practices, residents, hospitalists and other medical providers for call, clinic, rotation and shift schedules. OnCall Enterprise is a hospital-wide system for scheduling doctors and paging doctors on call. EasyPlot is for scientific plotting and data analysis.  [email or web address]  and use [place]'s universal password to access. If you do not have the password, please contact the hospital operator.  Locate the TResearch Medical Center - Brookside Campusprovider you are looking for under [facility] and page to a number that you can be directly reached. If you still have difficulty reaching the provider, please page the D[facility](Director on Call) for the Hospitalists listed on amion for assistance.  07/14/2021, 1:13 AM

## 2021-07-14 NOTE — Telephone Encounter (Signed)
Patient is currently admitted.

## 2021-07-15 ENCOUNTER — Other Ambulatory Visit: Payer: Self-pay

## 2021-07-15 DIAGNOSIS — I5032 Chronic diastolic (congestive) heart failure: Secondary | ICD-10-CM

## 2021-07-15 DIAGNOSIS — J9611 Chronic respiratory failure with hypoxia: Secondary | ICD-10-CM

## 2021-07-15 DIAGNOSIS — E119 Type 2 diabetes mellitus without complications: Secondary | ICD-10-CM

## 2021-07-15 LAB — BASIC METABOLIC PANEL
Anion gap: 10 (ref 5–15)
BUN: 21 mg/dL (ref 8–23)
CO2: 24 mmol/L (ref 22–32)
Calcium: 8.9 mg/dL (ref 8.9–10.3)
Chloride: 105 mmol/L (ref 98–111)
Creatinine, Ser: 0.87 mg/dL (ref 0.44–1.00)
GFR, Estimated: >60 mL/min (ref >60)
Glucose, Bld: 132 mg/dL — ABNORMAL HIGH (ref 70–99)
Potassium: 4.1 mmol/L (ref 3.5–5.1)
Sodium: 139 mmol/L (ref 135–145)

## 2021-07-15 LAB — GLUCOSE, CAPILLARY
Glucose-Capillary: 134 mg/dL — ABNORMAL HIGH (ref 70–99)
Glucose-Capillary: 151 mg/dL — ABNORMAL HIGH (ref 70–99)

## 2021-07-15 LAB — BRAIN NATRIURETIC PEPTIDE: B Natriuretic Peptide: 387.8 pg/mL — ABNORMAL HIGH (ref 0.0–100.0)

## 2021-07-15 MED ORDER — FUROSEMIDE 40 MG PO TABS: 40.0000 mg | ORAL_TABLET | Freq: Two times a day (BID) | ORAL | 0 refills | Status: DC

## 2021-07-15 NOTE — Consult Note (Signed)
   [facility] CM Inpatient Consult   07/15/2021  [name] Jun 22, 1941 [id number]  [employer] [ACO] Patient: Elizabeth Melton HMO  Primary Care Provider:  Kathyrn Lass, MD [facility], this provider is listed for the [facility] follow up  Patient screened for 3 hospitalization noted in the past 6 months with high risk score for unplanned readmission risk and  to assess for potential [place] Management service needs for post hospital transition.  Review of patient's medical record reveals patient is for home. Met with patient at bedside she states she had home health prior to admission and doesn't remember the company.  She gets her oxygen from [employer]. Explained [facility] Care Management follow up and she endorses Dr. Lysle Morales as her PCP. She agrees for post hospital follow up for complex disease management.  She expresses frustration with so many calls and states " it's easy to get confused with so much information coming at you."   Plan:  Continue to follow progress and disposition to assess for post hospital care management needs.    For questions contact:   Natividad Brood, RN BSN [facility] Liaison  [phone] business mobile phone Toll free office [phone]  Fax number: [phone] [email or web address]

## 2021-07-15 NOTE — Progress Notes (Signed)
NAME:  Elizabeth Melton, MRN:  [id number], DOB:  07/19/1941, LOS: 1 ADMISSION DATE:  07/13/2021, CONSULTATION DATE:  07/14/2021 REFERRING MD:  Alcario Drought, CHIEF COMPLAINT:  ILD, hypoxia  History of Present Illness:  Elizabeth Melton is a 80 y.o. female with PMH of of interstitial pulmonary fibrosis with UIP, pulmonary arterial hypertension, A. fib, diastolic heart failure, HTN, T2DM who presents for evaluation of hypoxia.  Patient reports she has baseline dyspnea on exertion and on 3 L of supplemental oxygen at home. Her DOE has been progressive the last few weeks. Often feels fatigue after minimal activity and short of breath after moderate activity such vacuuming or going up her stairs. She was recently instructed by her cardiologist to increase her lasix to 40 mg twice daily but patient refused due to the inconvenience of the constant urination. She still smokes about 2-3 cigarettes a day but is working on quitting.   She has a home health RN and PT and patient was found to have desatted to the 70s-80s with exertion. She was informed to report to the ED for evaluation. Pulmonology consulted to evaluate her hypoxia. Pt reports she is at her baseline. She reports improvement in her leg swelling but continues to have DOE and some muscle cramps in legs here in the hospital, which are not not new for her. She denies any pleuritic chest pain, palpitations, cough, fevers, leg pain or chills.  Pertinent  Medical History  She has a past medical history of Acute on chronic diastolic (congestive) heart failure ([place]) (05/19/2021), Diabetes mellitus without complication ([place]), Hyperlipidemia, Hypertension, Hypothyroidism, IPF (idiopathic pulmonary fibrosis) ([place]), and Thrombocytopenia ([place]).   Significant Hospital Events: Including procedures, antibiotic start and stop dates in addition to other pertinent events   CXR 8/3: Stable ILD with suspected superimposed interstitial edema or bronchitis CT chest 8/4:  Stable IPF findings, centrilobular emphysema, moderate cardiomegalyand stable PAH changes  Interim History / Subjective:  Pt wants to go home today. Breathing is okay. Continues to have occasional leg cramps but denies any fever, chest pain or palpitations.   Objective   Blood pressure (!) 122/44, pulse (!) 57, temperature 97.6 F (36.4 C), temperature source Oral, resp. rate 18, height 5' 6" (1.676 m), weight 72 kg, SpO2 98 %.        Intake/Output Summary (Last 24 hours) at 07/15/2021 0827 Last data filed at 07/14/2021 2143 Gross per 24 hour  Intake --  Output 1700 ml  Net -1700 ml   Filed Weights   07/14/21 0211 07/15/21 0428  Weight: 73.6 kg 72 kg    Examination: General: Pleasant, well-appearing elderly woman laying in bed. No acute distress. CV: RRR. No murmurs, rubs, or gallops.  No LE edema Pulmonary: On 4 L [place].  Lungs CTAB. Normal effort. Distant crackles at the bases. Abdominal: Soft, nontender, nondistended. Normal bowel sounds. Extremities: Palpable radial and DP pulses. Normal ROM. Skin: Warm and dry. No obvious rash or lesions. Neuro: A&Ox3. No focal deficit.  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 Mildly elevated BNP (262) with LE swelling and  a recent RHC showing PCWP of 28 c/w with acute on chronic HF causing increased O2 needs. Patient not agreeable to BID lasix at home due to interference with quality of life. S/p IV lasix 40 mg with 1.7 of UOP and resolution of LE edema. Patient at her baseline and  wants to go home today.   --Continue IV lasix 40 BID today, discharge on lasix 40 mg BID if agreeable otherwise resume home dose of 40 mg daily --Continue Nicotine patch --Continue prednisone --Ambulation with pulse Ox to assess need to increase O2 to 4 L --Follow up with ILD clinic, will likely need to start antifibrotic therapy if agreeable. Scheduled to see Dr. [name] on 07/21/21.   --Continue eliquis for previous PE.  Lacinda Axon, MD 07/15/2021, 8:49 AM IM Resident, PGY-2 [place] 41:10  Labs   CBC: Recent Labs  Lab 07/13/21 1852 07/14/21 0349  WBC 10.2 8.3  NEUTROABS 6.7  --   HGB 15.3* 14.1  HCT 47.5* 44.1  MCV 103.0* 103.0*  PLT 184 628    Basic Metabolic Panel: Recent Labs  Lab 07/13/21 1852 07/14/21 0349 07/15/21 0323  NA 134* 136 139  K 3.8 3.9 4.1  CL 103 103 105  CO2 _0 GLUCOSE 189* 190* 132*  BUN _1 CREATININE 1.11* 0.93 0.87  CALCIUM 8.8* 8.9 8.9  MG  --  2.1  --    GFR: Estimated Creatinine Clearance: 53.3 mL/min (by C-G formula based on SCr of 0.87 mg/dL). Recent Labs  Lab 07/13/21 1852 07/14/21 0349  WBC 10.2 8.3    Liver Function Tests: Recent Labs  Lab 07/13/21 1852  AST 21  ALT 17  ALKPHOS 42  BILITOT 1.0  PROT 7.0  ALBUMIN 3.2*   No results for input(s): LIPASE, AMYLASE in the last 168 hours. No results for input(s): AMMONIA in the last 168 hours.  ABG    Component Value Date/Time   HCO3 24.0 07/05/2021 1144   TCO2 25 07/05/2021 1144   ACIDBASEDEF 1.0 07/05/2021 1144   O2SAT 61.0 07/05/2021 1144     Coagulation Profile: No results for input(s): INR, PROTIME in the last 168 hours.  Cardiac Enzymes: No results for input(s): CKTOTAL, CKMB, CKMBINDEX, TROPONINI in the last 168 hours.  HbA1C: Hgb A1c MFr Bld  Date/Time Value Ref Range Status  05/20/2021 04:55 AM 7.0 (H) 4.8 - 5.6 % Final    Comment:    (NOTE)         Prediabetes: 5.7 - 6.4         Diabetes: >6.4         Glycemic control for  adults with diabetes: <7.0   01/16/2021 04:55 PM 7.6 (H) 4.8 - 5.6 % Final    Comment:    (NOTE) Pre diabetes:          5.7%-6.4%  Diabetes:              >6.4%  Glycemic control for   <7.0% adults with diabetes     CBG: Recent Labs  Lab 07/14/21 0605 07/14/21 1113 07/14/21 1708 07/14/21 2138 07/15/21 0623  GLUCAP 201* 108* 202* 86 134*

## 2021-07-15 NOTE — Discharge Summary (Signed)
Physician Discharge Summary  [name]:[id number] DOB: [birth year] DOA: 07/13/2021  PCP: Kathyrn Lass, MD  Admit date: 07/13/2021 Discharge date: 07/15/2021  Admitted From: Home Disposition: Home   Recommendations for Outpatient Follow-up:  Follow up with PCP in the next week for repeat BMP and volume assessment. Follow up with pulmonary, Dr. [name] as scheduled 5/18.  Home Health: None new Equipment/Devices: None new, continue 3L O2 Discharge Condition: Stable CODE STATUS: Full Diet recommendation: Heart healthy, carb-modified  Brief/Interim Summary: Elizabeth Melton is a 80 y.o. 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 She was directed to the ED though reported feeling ok when she got here. CT chest showed stable changes of pulmonary fibrosis. PCCM was consulted and recommended lasix challenge. With 80m IV BID she diuresed well with stable renal function and improvement in lower extremity edema. Respiratory status remains stable. Continued diuresis was recommended by PCCM and medical team, though the patient has declined this, requesting discharge, giving assurance that she will follow up in the next week and that she understands return precautions.  Discharge Diagnoses:  Principal Problem:   Acute on chronic respiratory failure with hypoxia (HCC) Active Problems:   Diabetes mellitus without complication (HCC)   Essential (primary) hypertension   ILD (interstitial lung disease) (HCC)   Chronic respiratory failure with hypoxia (HCC)   AF (paroxysmal atrial fibrillation) (HCC)   Chronic diastolic heart failure (HCC)   Acute on chronic respiratory failure with hypoxemia (HCC)  Acute on chronic hypoxic respiratory failure: IPF, UIP pattern. - Continue baseline prednisone (168mdaily current  tapering dose). IPF appears stable on CT chest on admission. - Augment home lasix to 4082mo BID until follow up.   Pulmonary HTN, elevated wedge pressure noted on RHC. Suspect an element of fluid overload with dependent edema. Echo 05/30/2021 showed LVEF 60-65%, G2DD, elevated LVEDP. RV enlarged with low normal systolic function. Mod LAE, mod RAE, mod MR, mod TR, mild-mod AR, mild AS.  - Pt responded well with 1.7L UOP after lasix 68m30m with stable creatinine, improved LE edema. She refuses to stay in the hospital any longer, but states she will take lasix twice daily and follow up next week for repeat labs. This does not strictly follow medical advice, but her respiratory status has stabilized and she has capacity to make that decision.    PAF, HTN: NSR currently. - Continue metoprolol and eliquis   T2DM: HbA1c 7%, exacerbated by steroids. - Continue SSI   Tobacco use: - Cessation counseling.  Discharge Instructions Discharge Instructions     Diet - low sodium heart healthy   Complete by: As directed    Discharge instructions   Complete by: As directed    You were admitted to have fluid taken off to help with breathing. It is recommended that you continue to take IV lasix and remain in the hospital an additional day but you have declined, and requested discharge.  - Take lasix 68mg37mCE A DAY until you follow up.  - Follow up with your primary doctor OR cardiologist NEXT WEEK to have your kidney function rechecked.  - Follow up with Dr. RamasChase Callercheduled on 8/18. - If your breathing gets worse or you develop nausea, fatigue, decreased urine output, seek medical attention right away. - Continue taking prednisone in the taper as you were.  Allergies as of 07/15/2021       Reactions   Losartan Other (See Comments)   Hallucinations        Medication List     TAKE these medications    acetaminophen 500 MG tablet Commonly known as: TYLENOL Take 500-1,000 mg by  mouth every 8 (eight) hours as needed for headache or mild pain (pain).   apixaban 5 MG Tabs tablet Commonly known as: ELIQUIS Take 1 tablet (5 mg total) by mouth 2 (two) times daily.   b complex vitamins tablet Take 1 tablet by mouth daily.   buPROPion 150 MG 12 hr tablet Commonly known as: ZYBAN Take 150 mg by mouth every morning.   Calcium + D3 600-200 MG-UNIT Tabs Take 2 tablets by mouth daily.   cholecalciferol 25 MCG (1000 UNIT) tablet Commonly known as: VITAMIN D3 Take 1,000 Units by mouth daily.   FISH OIL PO Take 1,000 mg by mouth daily.   furosemide 40 MG tablet Commonly known as: LASIX Take 1 tablet (40 mg total) by mouth 2 (two) times daily. What changed: when to take this   glucose blood test strip 1 each by Other route as needed for other (blood sugar).   insulin aspart 100 UNIT/ML FlexPen Commonly known as: NOVOLOG Insulin sliding scale: Blood sugar  120-150   3units                       151-200   4units                       201-250   7units                       251- 300  11units                       301-350   15uints                       351-400   20units                       >400         call MD immediately What changed:  how much to take how to take this when to take this additional instructions   insulin glargine 100 UNIT/ML Solostar Pen Commonly known as: LANTUS Inject 10 Units into the skin daily for 10 days. What changed: when to take this   levothyroxine 75 MCG tablet Commonly known as: SYNTHROID Take 75 mcg by mouth daily before breakfast.   lovastatin 40 MG tablet Commonly known as: MEVACOR Take 40 mg by mouth at bedtime.   metoprolol succinate 25 MG 24 hr tablet Commonly known as: TOPROL-XL Take 0.5 tablets (12.5 mg total) by mouth daily. What changed: when to take this   nicotine 14 mg/24hr patch Commonly known as: NICODERM CQ - dosed in mg/24 hours Place 21 mg onto the skin daily.   OXYGEN Inhale 3 L into the  lungs continuous.   PEN NEEDLES 29GX1/2" 29G X 12MM Misc For insulin injection   potassium chloride 10 MEQ tablet Commonly known as: KLOR-CON Take 1 tablet (10 mEq total) by mouth daily.   predniSONE 10 MG tablet Commonly known as: DELTASONE Take 2 tab 20 mg daily for 7 days, then take 10 mg daily. What changed:  how much to take how to  take this when to take this additional instructions   senna-docusate 8.6-50 MG tablet Commonly known as: Senokot-S Take 1 tablet by mouth at bedtime as needed for mild constipation.   Smart Sense Thin Lancets 26G Misc 1 each by Does not apply route 2 (two) times daily.   Stiolto Respimat 2.5-2.5 MCG/ACT Aers Generic drug: Tiotropium Bromide-Olodaterol Inhale 2 puffs into the lungs daily.   True Metrix Meter w/Device Kit 1 each by Other route in the morning and at bedtime.        Follow-up Information     Kathyrn Lass, MD. Schedule an appointment as soon as possible for a visit in 1 week(s).   Specialty: Family Medicine Why: for repeat labs. This is very important. Contact information: [place] [address] [phone]         Brand Males, MD Follow up on 07/28/2021.   Specialty: Pulmonary Disease Contact information: [place] [address] [phone]                Allergies  Allergen Reactions   Losartan Other (See Comments)    Hallucinations     Consultations: PCCM  Procedures/Studies: DG Chest 2 View  Result Date: 07/13/2021 CLINICAL DATA:  Shortness of breath. EXAM: CHEST - 2 VIEW COMPARISON:  05/18/2021 FINDINGS: The heart is mildly enlarged but stable. Stable tortuosity and calcification of the thoracic aorta. Underlying emphysematous changes and interstitial lung disease with suspected superimposed interstitial process. This could represent interstitial edema superimposed bronchitis. No definite pleural effusions. No focal infiltrates. No pneumothorax.  IMPRESSION: Underlying emphysematous changes and interstitial lung disease with suspected superimposed interstitial edema or bronchitis. Electronically Signed   By: Marijo Sanes M.D.   On: 07/13/2021 19:38   CT Chest Wo Contrast  Result Date: 07/14/2021 CLINICAL DATA:  Interstitial lung disease EXAM: CT CHEST WITHOUT CONTRAST TECHNIQUE: Multidetector CT imaging of the chest was performed following the standard protocol without IV contrast. COMPARISON:  06/10/2021 FINDINGS: Cardiovascular: Moderate multi-vessel coronary artery calcification is again identified, grossly stable since prior examination. There are extensive calcifications of the aortic valve again identified. Mild global cardiomegaly is stable. No pericardial effusion. The central pulmonary arteries are markedly enlarged in keeping with changes of pulmonary arterial hypertension. Extensive atherosclerotic calcification of the thoracic aorta. No aortic aneurysm. Mediastinum/Nodes: Shotty mediastinal adenopathy within the right paratracheal and prevascular lymph node groups is again identified, stable since prior examination. No pathologically enlarged thoracic adenopathy identified. The esophagus is unremarkable. Lungs/Pleura: Moderate centrilobular and paraseptal emphysema is seen at the lung apices. There is again seen thickening of the peribronchovascular interstitium, traction bronchiectasis, and peripheral architectural distortion demonstrating a a basilar gradient,, similar to prior examination, compatible with UIP type changes of interstitial lung disease. No superimposed ground-glass pulmonary infiltrate. More nodular areas of architectural distortion noted on prior examination, seen on axial image # 75/4 and 86/4 appears stable. No new focal pulmonary nodules or infiltrates. No pneumothorax or pleural effusion. The central airways are widely patent. Upper Abdomen: Cholelithiasis partially visualized. No acute abnormality. Musculoskeletal: No  acute bone abnormality. No lytic or blastic bone lesion is identified. IMPRESSION: Spectrum of findings, as noted above, similar to that noted on prior examination. Findings are consistent with UIP per consensus guidelines: Diagnosis of Idiopathic Pulmonary Fibrosis: An Official ATS/ERS/JRS/ALAT Clinical Practice Guideline. [place], Iss 5, [phone], Aug 11 2017. As noted on prior examination, follow-up examination is recommended in 12 months from  the original high-resolution chest CT examination to assess for temporal changes in the appearance of the lung parenchyma (estimated July 2023). Moderate superimposed centrilobular emphysema. Moderate multi-vessel coronary artery calcification. Extensive calcification of the aortic valve leaflets. Echocardiography may be helpful to assess the degree of valvular dysfunction. Stable moderate cardiomegaly. Stable morphologic changes of pulmonary arterial hypertension. Aortic Atherosclerosis (ICD10-I70.0) and Emphysema (ICD10-J43.9). Electronically Signed   By: Fidela Salisbury MD   On: 07/14/2021 00:12   CARDIAC CATHETERIZATION  Result Date: 07/05/2021 Formatting of this result is different from the original.   Hemodynamic findings consistent with severe pulmonary hypertension. Severe Mixed Pulmonary Hypertension-improved with oxygen: TPG 19 mmHg Mean PAP 47 mmHg this is a 36 mmHg with 3 L [place] PCWP elevated at 28 mmHg Severely reduced Cardiac Output-Index by both Fick (3.04-1.65) and Thermodilution (3.96-2.15) modestly improves with 3L [place] O2 (3.21-1.74) Significant oxygen responsiveness-SaO2 dropped to 83% on room air, improved to 98% on 3L [place] oxygen. Glenetta Hew, MD    Subjective: Feels well, walking at or better than her baseline. No dyspnea at rest, no chest pain. Wants to go home.   Discharge Exam: Vitals:   07/15/21 0811 07/15/21 0813  BP:    Pulse: (!) 57   Resp: 18   Temp:    SpO2: 98% 98%   General: Pt is alert, awake, not in  acute distress Cardiovascular: RRR, S1/S2 +, no rubs, no gallops Respiratory: Nonlabored, improved fine crackles Abdominal: Soft, NT, ND, bowel sounds + Extremities: 1+ LE edema (which is improved), no cyanosis  Labs: BNP (last 3 results) Recent Labs    05/18/21 1958 07/13/21 1852 07/15/21 0323  BNP 509.8* 262.3* 093.2*   Basic Metabolic Panel: Recent Labs  Lab 07/13/21 1852 07/14/21 0349 07/15/21 0323  NA 134* 136 139  K 3.8 3.9 4.1  CL 103 103 105  CO2 _0 GLUCOSE 189* 190* 132*  BUN _1 CREATININE 1.11* 0.93 0.87  CALCIUM 8.8* 8.9 8.9  MG  --  2.1  --    Liver Function Tests: Recent Labs  Lab 07/13/21 1852  AST 21  ALT 17  ALKPHOS 42  BILITOT 1.0  PROT 7.0  ALBUMIN 3.2*   No results for input(s): LIPASE, AMYLASE in the last 168 hours. No results for input(s): AMMONIA in the last 168 hours. CBC: Recent Labs  Lab 07/13/21 1852 07/14/21 0349  WBC 10.2 8.3  NEUTROABS 6.7  --   HGB 15.3* 14.1  HCT 47.5* 44.1  MCV 103.0* 103.0*  PLT 184 178   Cardiac Enzymes: No results for input(s): CKTOTAL, CKMB, CKMBINDEX, TROPONINI in the last 168 hours. BNP: Invalid input(s): POCBNP CBG: Recent Labs  Lab 07/14/21 1113 07/14/21 1708 07/14/21 2138 07/15/21 0623 07/15/21 1100  GLUCAP 108* 202* 86 134* 151*   D-Dimer No results for input(s): DDIMER in the last 72 hours. Hgb A1c No results for input(s): HGBA1C in the last 72 hours. Lipid Profile No results for input(s): CHOL, HDL, LDLCALC, TRIG, CHOLHDL, LDLDIRECT in the last 72 hours. Thyroid function studies No results for input(s): TSH, T4TOTAL, T3FREE, THYROIDAB in the last 72 hours.  Invalid input(s): FREET3 Anemia work up No results for input(s): VITAMINB12, FOLATE, FERRITIN, TIBC, IRON, RETICCTPCT in the last 72 hours. Urinalysis    Component Value Date/Time   COLORURINE AMBER (A) 01/25/2021 1700   APPEARANCEUR CLOUDY (A) 01/25/2021 1700   LABSPEC 1.027 01/25/2021 1700   PHURINE  6.0 01/25/2021 1700   GLUCOSEU NEGATIVE 01/25/2021  [place] (A) 01/25/2021 1700   BILIRUBINUR NEGATIVE 01/25/2021 1700   KETONESUR NEGATIVE 01/25/2021 1700   PROTEINUR 30 (A) 01/25/2021 1700   NITRITE NEGATIVE 01/25/2021 1700   LEUKOCYTESUR TRACE (A) 01/25/2021 1700    Microbiology Recent Results (from the past 240 hour(s))  Resp Panel by RT-PCR (Flu A&B, Covid) Nasopharyngeal Swab     Status: None   Collection Time: 07/13/21  6:52 PM   Specimen: Nasopharyngeal Swab; Nasopharyngeal(NP) swabs in vial transport medium  Result Value Ref Range Status   SARS Coronavirus 2 by RT PCR NEGATIVE NEGATIVE Final    Comment: (NOTE) SARS-CoV-2 target nucleic acids are NOT DETECTED.  The SARS-CoV-2 RNA is generally detectable in upper respiratory specimens during the acute phase of infection. The lowest concentration of SARS-CoV-2 viral copies this assay can detect is 138 copies/mL. A negative result does not preclude SARS-Cov-2 infection and should not be used as the sole basis for treatment or other patient management decisions. A negative result may occur with  improper specimen collection/handling, submission of specimen other than nasopharyngeal swab, presence of viral mutation(s) within the areas targeted by this assay, and inadequate number of viral copies(<138 copies/mL). A negative result must be combined with clinical observations, patient history, and epidemiological information. The expected result is Negative.  Fact Sheet for Patients:  EntrepreneurPulse.com.au  Fact Sheet for Healthcare Providers:  IncredibleEmployment.be  This test is no t yet approved or cleared by the [place] FDA and  has been authorized for detection and/or diagnosis of SARS-CoV-2 by FDA under an Emergency Use Authorization (EUA). This EUA will remain  in effect (meaning this test can be used) for the duration of the COVID-19 declaration under Section  564(b)(1) of the Act, 21 U.S.C.section 360bbb-3(b)(1), unless the authorization is terminated  or revoked sooner.       Influenza A by PCR NEGATIVE NEGATIVE Final   Influenza B by PCR NEGATIVE NEGATIVE Final    Comment: (NOTE) The Xpert Xpress SARS-CoV-2/FLU/RSV plus assay is intended as an aid in the diagnosis of influenza from Nasopharyngeal swab specimens and should not be used as a sole basis for treatment. Nasal washings and aspirates are unacceptable for Xpert Xpress SARS-CoV-2/FLU/RSV testing.  Fact Sheet for Patients: EntrepreneurPulse.com.au  Fact Sheet for Healthcare Providers: IncredibleEmployment.be  This test is not yet approved or cleared by the [place] FDA and has been authorized for detection and/or diagnosis of SARS-CoV-2 by FDA under an Emergency Use Authorization (EUA). This EUA will remain in effect (meaning this test can be used) for the duration of the COVID-19 declaration under Section 564(b)(1) of the Act, 21 U.S.C. section 360bbb-3(b)(1), unless the authorization is terminated or revoked.  Performed at [facility], [place] [address], [place],  [address]     Time coordinating discharge: Approximately 40 minutes  Patrecia Pour, MD  [facility] 07/15/2021, 11:55 AM

## 2021-07-15 NOTE — Progress Notes (Signed)
SATURATION QUALIFICATIONS: (This note is used to comply with regulatory documentation for home oxygen)  Patient Saturations on Room Air at Rest = NA%  Patient Saturations on Room Air while Ambulating = NA%  Patient Saturations on 4 Liters of oxygen while Ambulating = 87%  Please briefly explain why patient needs home oxygen: Pt O2 saturation drops when she is off O2

## 2021-07-15 NOTE — Progress Notes (Signed)
RN gave pt discharge instructions and the patient stated understanding. IV has been removed and belongings have been packed and the patient is getting dressed waiting for her ride.

## 2021-07-18 ENCOUNTER — Other Ambulatory Visit: Payer: Self-pay

## 2021-07-18 NOTE — Patient Outreach (Signed)
[facility]) Care Management  07/18/2021  Elizabeth Melton [birth year] [id number]     Transition of Care Referral  Referral Date: 07/18/2021 Referral [facility] Liaison Date of Discharge: 07/15/2021 Facility: [facility] PCP Office Does [facility]   Outreach attempt # 1 to patient. Spoke with patient who denies any acute issues or concerns at present. She voices she is doing an feeling better and currently doing some light housework.   Social: Patient resides in her apartment along with one of her daughters. She voices that she is independent with ADLs/IADLs except she no longer drives. Her children take her to appts. She is aware of [employer] transportation but does not like services. Denies any recent falls. She was getting HHPT ut completed services a few wks ago. She uses cane or walker to assist with ambulation.   Conditions: Per chart review, patient has PMH that includes but not limited to DM, ILD, AF, CHF and HTN. Patient with two admissions within the last 30 days. One admission for pulmonary HTN and another for resp failure and CHF. PCP offices does TOC and patient voices she spoke with nurse from MD office today to completed [facility] Call.    Appointments: Patient is awaiting call back from PCP office with appt as MD has no openings at this time.. She is also followed by cardiology and pulmonologist and sees as ordered.   Medications Reviewed Today     Reviewed by [name], RN (Registered Nurse) on 07/18/21 at 1205  Med List Status: <None>   Medication Order Taking? Sig Documenting Provider Last Dose Status Informant  acetaminophen (TYLENOL) 500 MG tablet [id number] No Take 500-1,000 mg by mouth every 8 (eight) hours as needed for headache or mild pain (pain). [provider] 07/12/2021 Active Self  apixaban (ELIQUIS) 5 MG TABS tablet [id number] No Take 1 tablet (5 mg total) by mouth 2 (two) times daily. Mercy Riding, MD 07/13/2021 100  Active Self           Med Note [name], [name]   Thu Jul 14, 2021 12:04 AM)    b complex vitamins tablet [id number] No Take 1 tablet by mouth daily. [provider] 07/13/2021 Active Self  Blood Glucose Monitoring Suppl (TRUE METRIX METER) w/Device KIT [id number] No 1 each by Other route in the morning and at bedtime. [provider] Taking Active Self  buPROPion (ZYBAN) 150 MG 12 hr tablet [id number]  Take 150 mg by mouth every morning. [provider]  Active Self           Med Note [name], [name]   Thu Jul 14, 2021 12:12 AM) Pt has not started this medication  Calcium Carb-Cholecalciferol (CALCIUM + D3) 600-200 MG-UNIT TABS [id number] No Take 2 tablets by mouth daily.  [provider] 07/13/2021 Active Self  cholecalciferol (VITAMIN D3) 25 MCG (1000 UT) tablet [id number] No Take 1,000 Units by mouth daily. [provider] 07/13/2021 Active Self  furosemide (LASIX) 40 MG tablet [id number]  Take 1 tablet (40 mg total) by mouth 2 (two) times daily. Patrecia Pour, MD  Active   glucose blood test strip [id number] No 1 each by Other route as needed for other (blood sugar). [provider] Taking Active Self  insulin aspart (NOVOLOG) 100 UNIT/ML FlexPen [id number] No Insulin sliding scale: Blood sugar  120-150   3units  151-200   4units                       201-250   7units                       251- 300  11units                       301-350   15uints                       351-400   20units                       >400         call MD immediately [name], MD 07/13/2021 Active Self  insulin glargine (LANTUS) 100 UNIT/ML Solostar Pen [id number] No Inject 10 Units into the skin daily for 10 days.  Patient taking differently: Inject 10 Units into the skin at bedtime.   [name], MD 07/12/2021 Expired 07/14/21 2359 Self  Insulin Pen Needle (PEN NEEDLES 29GX1/2") 29G X 12MM MISC [id number] No For insulin injection [name], MD Taking Active Self   levothyroxine (SYNTHROID, LEVOTHROID) 75 MCG tablet [id number] No Take 75 mcg by mouth daily before breakfast.  [provider] 07/13/2021 Active Self  lovastatin (MEVACOR) 40 MG tablet [id number] No Take 40 mg by mouth at bedtime. [provider] 07/12/2021 Active Self  metoprolol succinate (TOPROL-XL) 25 MG 24 hr tablet [id number] No Take 0.5 tablets (12.5 mg total) by mouth daily. [name], MD 07/12/2021 Active Self  nicotine (NICODERM CQ - DOSED IN MG/24 HOURS) 14 mg/24hr patch [id number] No Place 21 mg onto the skin daily. [provider] 07/13/2021 Active Self           Med Note [name], [name]   Fri Jul 01, 2021 11:29 AM)    Omega-3 Fatty Acids (FISH OIL PO) [id number] No Take 1,000 mg by mouth daily. [provider] 07/13/2021 Active Self  OXYGEN [id number] No Inhale 3 L into the lungs continuous. [provider] 07/13/2021 Active Self  potassium chloride (KLOR-CON) 10 MEQ tablet [id number] No Take 1 tablet (10 mEq total) by mouth daily. [name], MD 07/13/2021 Active Self  predniSONE (DELTASONE) 10 MG tablet [id number] No Take 2 tab 20 mg daily for 7 days, then take 10 mg daily. [name], [name], MD 07/13/2021 Active            Med Note [name], [name]   Thu Jul 14, 2021 12:24 AM) Pt to start 10 mg qd x 10 days 07/14/21  senna-docusate (SENOKOT-S) 8.6-50 MG tablet [id number] No Take 1 tablet by mouth at bedtime as needed for mild constipation. [name], MD unk Active Self  SMART SENSE THIN LANCETS 26G [place] [id number] No 1 each by Does not apply route 2 (two) times daily. [provider] Taking Active Self  sodium chloride flush (NS) 0.9 % injection 3 mL [id number]   [name], MD  Active   Tiotropium Bromide-Olodaterol (STIOLTO RESPIMAT) 2.5-2.5 MCG/ACT AERS [id number] No Inhale 2 puffs into the lungs daily. Brand Males, MD 07/13/2021 Active Self           Med Note [name], Linton Rump   Fri Jul 01, 2021 11:29 AM)              Fall Risk   07/18/2021  Falls in the  past year? 0  Number falls in past yr: 0  Injury with Fall? 0  Risk for fall due to : Medication side effect  Follow up Education provided;Falls evaluation completed     Depression screen [facility] 2/9 07/18/2021  Decreased Interest 0  Down, Depressed, Hopeless 0  PHQ - 2 Score 0    SDOH Screenings   Alcohol Screen: Not on file  Depression (PHQ2-9): Low Risk    PHQ-2 Score: 0  Financial Resource Strain: Not on file  Food Insecurity: No Food Insecurity   Worried About Charity fundraiser in the Last Year: Never true   Ran Out of Food in the Last Year: Never true  Housing: Not on file  Physical Activity: Not on file  Social Connections: Not on file  Stress: Not on file  Tobacco Use: Medium Risk   Smoking Tobacco Use: Former   Smokeless Tobacco Use: Former  Transport planner Needs: No Data processing manager (Medical): No   Lack of Transportation (Non-Medical): No     Goals Addressed               This Visit's Progress     (THN)Make and Keep All Appointments (pt-stated)        Timeframe:  Short-Term Goal Priority:  High Start Date:   07/18/21                          Expected End Date:   Sept 2022                    Follow Up Date Sept 2022  Barriers: Health Behaviors Knowledge    - arrange a ride through an agency 1 week before appointment - ask family or friend for a ride - call to cancel if needed - keep a calendar with appointment dates    Why is this important?   Part of staying healthy is seeing the doctor for follow-up care.  If you forget your appointments, there are some things you can do to stay on track.    Notes:   07/18/21-Patient awaiting call back from PCP office for MD follow up appt.       (THN)Set My Target A1C-Diabetes Type 2 (pt-stated)        Timeframe:  Long-Range Goal Priority:  High Start Date: 07/18/21                            Expected End Date:  Dec 2022                     Follow Up Date Sept  2022   Barriers: Health Behaviors    - set target A1C-maintain A1C level of 7.0    Why is this important?   Your target A1C is decided together by you and your doctor.  It is based on several things like your age and other health issues.    Notes:   07/18/21-Patient reports checking cbgs about 2x/day. Blood sugars ranging in the 90s to mid 100s. Last 1C on file 7.0(June 2022).      (THN)Track and Manage Fluids and Swelling-Heart Failure (pt-stated)        Timeframe:  Short-Term Goal Priority:  High Start Date:  07/18/2021  Expected End Date:  Sept 2022                     Follow Up Date Sept 2022  Barriers: Health Behaviors Knowledge    - call office if I gain more than 2 pounds in one day or 5 pounds in one week - keep legs up while sitting - track weight in diary - use salt in moderation - watch for swelling in feet, ankles and legs every day - weigh myself daily    Why is this important?   It is important to check your weight daily and watch how much salt and liquids you have.  It will help you to manage your heart failure.    Notes:   07/18/21 Patient reports weighing 2x/day daily. Wgt this am was 164 lbs. She voices adherence to diuretic regimen.      (THN)Track and Manage Symptoms-Heart Failure (pt-stated)        Timeframe:  Short-Term Goal Priority:  High Start Date:  07/18/2021                           Expected End Date:  Sept 2022                      Follow Up Date: Sept 2022   Barriers: Health Behaviors Knowledge    - begin a heart failure diary - develop a rescue plan - follow rescue plan if symptoms flare-up - know when to call the doctor - track symptoms and what helps feel better or worse    Why is this important?   You will be able to handle your symptoms better if you keep track of them.  Making some simple changes to your lifestyle will help.  Eating healthy is one thing you can do to take good care of yourself.     Notes:   07/18/21-Patient reports cardiac status managed at present. Denies any fluid retention or SOB. She is using oxygen 24/7 at 4L/min.         Consent:THN services reviewed and discussed with patient. Verbal consent for services given.  Plan: RN CM discussed with patient next outreach within four weeks. Patient gave verbal consent and in agreement with RN CM follow up and timeframe. Patient aware that they may contact RN CM sooner for any issues or concerns. RN CM reviewed goals and plan of care with patient. Patient agrees to care plan and follow up. RN CM will send barriers letter and route encounter to PCP. RN CM will send welcome letter to patient.  Enzo Montgomery, RN,BSN,CCM [place] [facility] Telephonic Care Management Coordinator Direct Phone: [phone] Toll Free: [phone] Fax: [phone]

## 2021-07-21 DIAGNOSIS — J9621 Acute and chronic respiratory failure with hypoxia: Secondary | ICD-10-CM | POA: Diagnosis not present

## 2021-07-21 DIAGNOSIS — E1169 Type 2 diabetes mellitus with other specified complication: Secondary | ICD-10-CM | POA: Diagnosis not present

## 2021-07-21 DIAGNOSIS — J849 Interstitial pulmonary disease, unspecified: Secondary | ICD-10-CM | POA: Diagnosis not present

## 2021-07-21 DIAGNOSIS — I5032 Chronic diastolic (congestive) heart failure: Secondary | ICD-10-CM | POA: Diagnosis not present

## 2021-07-21 DIAGNOSIS — I4891 Unspecified atrial fibrillation: Secondary | ICD-10-CM | POA: Diagnosis not present

## 2021-07-21 DIAGNOSIS — J449 Chronic obstructive pulmonary disease, unspecified: Secondary | ICD-10-CM | POA: Diagnosis not present

## 2021-07-21 DIAGNOSIS — I10 Essential (primary) hypertension: Secondary | ICD-10-CM | POA: Diagnosis not present

## 2021-07-21 DIAGNOSIS — E1165 Type 2 diabetes mellitus with hyperglycemia: Secondary | ICD-10-CM | POA: Diagnosis not present

## 2021-07-21 DIAGNOSIS — E039 Hypothyroidism, unspecified: Secondary | ICD-10-CM | POA: Diagnosis not present

## 2021-07-21 DIAGNOSIS — M810 Age-related osteoporosis without current pathological fracture: Secondary | ICD-10-CM | POA: Diagnosis not present

## 2021-07-21 DIAGNOSIS — Z9981 Dependence on supplemental oxygen: Secondary | ICD-10-CM | POA: Diagnosis not present

## 2021-07-21 DIAGNOSIS — E785 Hyperlipidemia, unspecified: Secondary | ICD-10-CM | POA: Diagnosis not present

## 2021-07-25 ENCOUNTER — Other Ambulatory Visit (HOSPITAL_COMMUNITY): Payer: Self-pay

## 2021-07-25 MED ORDER — APIXABAN 5 MG PO TABS: 5.0000 mg | ORAL_TABLET | Freq: Two times a day (BID) | ORAL | 0 refills | Status: DC

## 2021-07-26 DIAGNOSIS — J849 Interstitial pulmonary disease, unspecified: Secondary | ICD-10-CM | POA: Diagnosis not present

## 2021-07-26 DIAGNOSIS — I4891 Unspecified atrial fibrillation: Secondary | ICD-10-CM | POA: Diagnosis not present

## 2021-07-27 ENCOUNTER — Other Ambulatory Visit (HOSPITAL_COMMUNITY): Payer: Self-pay | Admitting: Nurse Practitioner

## 2021-07-27 NOTE — Telephone Encounter (Signed)
Rx(s) sent to pharmacy electronically.

## 2021-07-28 ENCOUNTER — Other Ambulatory Visit: Payer: Self-pay

## 2021-07-28 ENCOUNTER — Ambulatory Visit: Payer: Medicare HMO | Admitting: Internal Medicine

## 2021-07-29 ENCOUNTER — Other Ambulatory Visit: Payer: Self-pay

## 2021-07-30 LAB — SARS CORONAVIRUS 2 (TAT 6-24 HRS): SARS Coronavirus 2: NEGATIVE

## 2021-08-01 ENCOUNTER — Other Ambulatory Visit: Payer: Self-pay

## 2021-08-01 ENCOUNTER — Ambulatory Visit (INDEPENDENT_AMBULATORY_CARE_PROVIDER_SITE_OTHER): Payer: Medicare HMO | Admitting: Internal Medicine

## 2021-08-01 DIAGNOSIS — J849 Interstitial pulmonary disease, unspecified: Secondary | ICD-10-CM

## 2021-08-01 NOTE — Progress Notes (Signed)
Elizabeth Melton and DLCO preformed today

## 2021-08-02 ENCOUNTER — Ambulatory Visit: Payer: Medicare HMO | Admitting: Cardiology

## 2021-08-02 DIAGNOSIS — Z1231 Encounter for screening mammogram for malignant neoplasm of breast: Secondary | ICD-10-CM | POA: Diagnosis not present

## 2021-08-08 LAB — PULMONARY FUNCTION TEST
DL/VA % pred: 46 %
DL/VA: 1.89 ml/min/mmHg/L
DLCO cor % pred: 30 %
DLCO cor: 5.69 ml/min/mmHg
DLCO unc % pred: 30 %
DLCO unc: 5.69 ml/min/mmHg
FEF 25-75 Pre: 0.67 L/sec
FEF2575-%Pred-Pre: 51 %
FEV1-%Pred-Pre: 76 %
FEV1-Pre: 1.18 L
FEV1FVC-%Pred-Pre: 86 %
FEV6-%Pred-Pre: 91 %
FEV6-Pre: 1.76 L
FEV6FVC-%Pred-Pre: 103 %
FVC-%Pred-Pre: 89 %
FVC-Pre: 1.8 L
Pre FEV1/FVC ratio: 66 %
Pre FEV6/FVC Ratio: 99 %

## 2021-08-09 ENCOUNTER — Telehealth: Payer: Self-pay | Admitting: Internal Medicine

## 2021-08-09 ENCOUNTER — Other Ambulatory Visit: Payer: Self-pay

## 2021-08-09 ENCOUNTER — Ambulatory Visit: Payer: Medicare HMO | Admitting: Internal Medicine

## 2021-08-09 ENCOUNTER — Encounter: Payer: Self-pay | Admitting: Internal Medicine

## 2021-08-09 VITALS — BP 126/60 | HR 62 | Temp 98.0°F | Ht 66.0 in | Wt 161.0 lb

## 2021-08-09 DIAGNOSIS — J9611 Chronic respiratory failure with hypoxia: Secondary | ICD-10-CM | POA: Diagnosis not present

## 2021-08-09 DIAGNOSIS — I2723 Pulmonary hypertension due to lung diseases and hypoxia: Secondary | ICD-10-CM | POA: Diagnosis not present

## 2021-08-09 DIAGNOSIS — R911 Solitary pulmonary nodule: Secondary | ICD-10-CM

## 2021-08-09 DIAGNOSIS — I2722 Pulmonary hypertension due to left heart disease: Secondary | ICD-10-CM

## 2021-08-09 DIAGNOSIS — Z9114 Patient's other noncompliance with medication regimen: Secondary | ICD-10-CM | POA: Diagnosis not present

## 2021-08-09 DIAGNOSIS — J849 Interstitial pulmonary disease, unspecified: Secondary | ICD-10-CM | POA: Diagnosis not present

## 2021-08-09 DIAGNOSIS — F17210 Nicotine dependence, cigarettes, uncomplicated: Secondary | ICD-10-CM

## 2021-08-09 DIAGNOSIS — J84112 Idiopathic pulmonary fibrosis: Secondary | ICD-10-CM

## 2021-08-09 DIAGNOSIS — Z720 Tobacco use: Secondary | ICD-10-CM

## 2021-08-09 MED ORDER — STIOLTO RESPIMAT 2.5-2.5 MCG/ACT IN AERS: 2.0000 | INHALATION_SPRAY | Freq: Every day | RESPIRATORY_TRACT | 3 refills | Status: DC

## 2021-08-09 NOTE — Telephone Encounter (Signed)
Elizabeth Melton I made some changes to her follow-up plan.  Plan - Please get a VQ scan to rule out chronic thromboembolic pulmonary disease -Also ensure the follow-up is an 2 months instead of 3 months

## 2021-08-09 NOTE — Patient Outreach (Signed)
[facility]) Care Management  08/09/2021  Elizabeth Melton June 03, 1941 [id number]   Telephone Assessment  Successful outreach call to patient. Spoke briefly with patient as she reported she was heading to lung MD appt. She reports she has been doing and feeling fairly well. Denies any new issues or concerns or any RN CM needs at this time. Unable to review meds as patient en route to appt.    Medications Reviewed Today     Reviewed by [name], RN (Registered Nurse) on 07/18/21 at 1205  Med List Status: <None>   Medication Order Taking? Sig Documenting Provider Last Dose Status Informant  acetaminophen (TYLENOL) 500 MG tablet [id number] No Take 500-1,000 mg by mouth every 8 (eight) hours as needed for headache or mild pain (pain). [provider] 07/12/2021 Active Self  apixaban (ELIQUIS) 5 MG TABS tablet [id number] No Take 1 tablet (5 mg total) by mouth 2 (two) times daily. Mercy Riding, MD 07/13/2021 100 Active Self           Med Note [name], [name]   Thu Jul 14, 2021 12:04 AM)    b complex vitamins tablet [id number] No Take 1 tablet by mouth daily. [provider] 07/13/2021 Active Self  Blood Glucose Monitoring Suppl (TRUE METRIX METER) w/Device KIT [id number] No 1 each by Other route in the morning and at bedtime. [provider] Taking Active Self  buPROPion (ZYBAN) 150 MG 12 hr tablet [id number]  Take 150 mg by mouth every morning. [provider]  Active Self           Med Note [name], [name]   Thu Jul 14, 2021 12:12 AM) Pt has not started this medication  Calcium Carb-Cholecalciferol (CALCIUM + D3) 600-200 MG-UNIT TABS [id number] No Take 2 tablets by mouth daily.  [provider] 07/13/2021 Active Self  cholecalciferol (VITAMIN D3) 25 MCG (1000 UT) tablet [id number] No Take 1,000 Units by mouth daily. [provider] 07/13/2021 Active Self  furosemide (LASIX) 40 MG tablet [id number]  Take 1 tablet (40 mg total)  by mouth 2 (two) times daily. Patrecia Pour, MD  Active   glucose blood test strip [id number] No 1 each by Other route as needed for other (blood sugar). [provider] Taking Active Self  insulin aspart (NOVOLOG) 100 UNIT/ML FlexPen [id number] No Insulin sliding scale: Blood sugar  120-150   3units                       151-200   4units                       201-250   7units                       251- 300  11units                       301-350   15uints                       351-400   20units                       >400         call MD immediately [name], MD 07/13/2021 Active Self  insulin glargine (LANTUS) 100 UNIT/ML Solostar Pen [id number] No Inject 10 Units into the skin  daily for 10 days.  Patient taking differently: Inject 10 Units into the skin at bedtime.   [name], MD 07/12/2021 Expired 07/14/21 2359 Self  Insulin Pen Needle (PEN NEEDLES 29GX1/2") 29G X 12MM MISC [id number] No For insulin injection [name], MD Taking Active Self  levothyroxine (SYNTHROID, LEVOTHROID) 75 MCG tablet [id number] No Take 75 mcg by mouth daily before breakfast.  [provider] 07/13/2021 Active Self  lovastatin (MEVACOR) 40 MG tablet [id number] No Take 40 mg by mouth at bedtime. [provider] 07/12/2021 Active Self  metoprolol succinate (TOPROL-XL) 25 MG 24 hr tablet [id number] No Take 0.5 tablets (12.5 mg total) by mouth daily. [name], MD 07/12/2021 Active Self  nicotine (NICODERM CQ - DOSED IN MG/24 HOURS) 14 mg/24hr patch [id number] No Place 21 mg onto the skin daily. [provider] 07/13/2021 Active Self           Med Note [name], [name]   Fri Jul 01, 2021 11:29 AM)    Omega-3 Fatty Acids (FISH OIL PO) [id number] No Take 1,000 mg by mouth daily. [provider] 07/13/2021 Active Self  OXYGEN [id number] No Inhale 3 L into the lungs continuous. [provider] 07/13/2021 Active Self  potassium chloride (KLOR-CON) 10 MEQ tablet [id number] No Take 1 tablet (10  mEq total) by mouth daily. [name], MD 07/13/2021 Active Self  predniSONE (DELTASONE) 10 MG tablet [id number] No Take 2 tab 20 mg daily for 7 days, then take 10 mg daily. [name], [name], MD 07/13/2021 Active            Med Note [name], [name]   Thu Jul 14, 2021 12:24 AM) Pt to start 10 mg qd x 10 days 07/14/21  senna-docusate (SENOKOT-S) 8.6-50 MG tablet [id number] No Take 1 tablet by mouth at bedtime as needed for mild constipation. [name], MD unk Active Self  SMART SENSE THIN LANCETS 26G [place] [id number] No 1 each by Does not apply route 2 (two) times daily. [provider] Taking Active Self  sodium chloride flush (NS) 0.9 % injection 3 mL [id number]   [name], MD  Active   Tiotropium Bromide-Olodaterol (STIOLTO RESPIMAT) 2.5-2.5 MCG/ACT AERS [id number] No Inhale 2 puffs into the lungs daily. Brand Males, MD 07/13/2021 Active Self           Med Note [name], [name]   Fri Jul 01, 2021 11:29 AM)                Goals Addressed               This Visit's Progress     (THN)Make and Keep All Appointments (pt-stated)        Timeframe:  Short-Term Goal Priority:  High Start Date:   07/18/21                          Expected End Date:   Sept2022                    Follow Up Date Oct 2022  Barriers: Health Behaviors Knowledge    - arrange a ride through an agency 1 week before appointment - ask family or friend for a ride - call to cancel if needed - keep a calendar with appointment dates    Why is this important?   Part of staying healthy is seeing the doctor for follow-up care.  If you forget your appointments, there are  some things you can do to stay on track.    Notes:   07/18/21-Patient awaiting call back from PCP office for MD follow up appt.   08/09/21-Patient currently headed to lung MD ppt. States PCP appt is tomorrow.      (THN)Set My Target A1C-Diabetes Type 2 (pt-stated)        Timeframe:  Long-Range Goal Priority:  High Start Date: 07/18/21                             Expected End Date:  Dec 2022                     Follow Up Date Oct 2022   Barriers: Health Behaviors    - set target A1C-maintain A1C level of 7.0    Why is this important?   Your target A1C is decided together by you and your doctor.  It is based on several things like your age and other health issues.    Notes:   07/18/21-Patient reports checking cbgs about 2x/day. Blood sugars ranging in the 90s to mid 100s. Last 1C on file 7.0(June 2022).  08/09/2021-Did not get to address this call.       (THN)Track and Manage Fluids and Swelling-Heart Failure (pt-stated)        Timeframe:  Short-Term Goal Priority:  High Start Date:  07/18/2021                           Expected End Date:  Sept 2022                     Follow Up Date Oct 2022  Barriers: Health Behaviors Knowledge    - call office if I gain more than 2 pounds in one day or 5 pounds in one week - keep legs up while sitting - track weight in diary - use salt in moderation - watch for swelling in feet, ankles and legs every day - weigh myself daily    Why is this important?   It is important to check your weight daily and watch how much salt and liquids you have.  It will help you to manage your heart failure.    Notes:   07/18/21 Patient reports weighing 2x/day daily. Wgt this am was 164 lbs. She voices adherence to diuretic regimen. 08/09/21-Patient reports weigh down to about 159lbs. Denies any edema/SOB or HF sxs.       (THN)Track and Manage Symptoms-Heart Failure (pt-stated)        Timeframe:  Short-Term Goal Priority:  High Start Date:  07/18/2021                           Expected End Date:  Oct 2022                      Follow Up Date: Sept 2022   Barriers: Health Behaviors Knowledge    - begin a heart failure diary - develop a rescue plan - follow rescue plan if symptoms flare-up - know when to call the doctor - track symptoms and what helps feel better or worse    Why  is this important?   You will be able to handle your symptoms better if you keep track of them.  Making some simple changes to your lifestyle will  help.  Eating healthy is one thing you can do to take good care of yourself.    Notes:   07/18/21-Patient reports cardiac status managed at present. Denies any fluid retention or SOB. She is using oxygen 24/7 at 4L/min.   08/09/21-Patient continues to use oxygen at 4L/min. Denies any edema,SOB, or other HF sxs. She has MD follow up appt tomorrow.        Plan: RN CM discussed with patient next outreach within 4-5wks. Patient agrees to care plan and follow up. Patient gave verbal consent and in agreement with RN CM follow up and timeframe. Patient aware that they may contact RN CM sooner for any issues or concerns. RN CM reviewed goals and plan of care with patient.   Enzo Montgomery, RN,BSN,CCM [place] [facility] Telephonic Care Management Coordinator Direct Phone: [phone] Toll Free: [phone] Fax: [phone]

## 2021-08-09 NOTE — Patient Instructions (Addendum)
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  ; this is generally progerssive and there is evidence for progression - Previously reluctant to undergo biopsy or start antifibrotic's   - Noted June and August 2022 that you are increasingly willing to reconsider  anti fibrotics though not fully and still have reservations about side effects - smoking can reduce efficancy of anti fibotics  PLAN -  -Do spirometry and DLCO in  3 motnths - continue 3L [place] - 4L [place] - bring son to discus anti fibrotics at next visi   Pulmonary emphysema   Stable disease but noted in hospital they gave you prednisone burst that you completed  Plan -Continue Spiriva - continue albuterol as neded   Pulmonary artery enlargement on CT and 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 (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)  -Heart catheterization held off in August/September 2021 due to blood clot in the lung in September 2021 - Echo June 2022 with continued severe pulmonary hypertension - Right heart cath in July 2022 showed severe pulmonary hypertension due to heart and lungissues  - you seem better from heart after admission august 2022 and lasix  Plan- -continue Eliquis - - do VQ Scan (addendum instruction) for chronic PE - might need repeat right heart cath to ensure heart is fully optimized before csondiering an inhaler called tyvaso for this condition   Chronic respiratory failure with o2 need due to above  Plan  - continue 3-4 L [place]  Followup   - 260-monthbut after completing the above; Dr. RaChase Callern 30-minute slot  - bring son AnElberta Fortis

## 2021-08-09 NOTE — Telephone Encounter (Signed)
HI Elizabeth Melton you are  going to see this patient in 6 to 7 weeks from now on 09/20/2021.  She had heart catheterization that showed severe pulmonary hypertension.  After that she got admitted and diuresed.  I saw her in clinic and she is better.  Her pulm fibrosis is progressive but given the concern with noncompliance and her own reluctance I am not sure about starting antifibrotic's.  From a pulmonary hypertension standpoint she could have WHO group 2 group 3 or group 4.  If it is group 3 then she will qualify for treprostinil.  If it is group 4 she will qualify for Adempas.  I am wondering if she needs a repeat right heart catheterization given the increased diuresis?  This is for you to consider.  Meanwhile I will go ahead and get a VQ scan to rule out chronic thromboembolic disease  Thanks  MR

## 2021-08-09 NOTE — Progress Notes (Signed)
Subjective:  Patient ID: Elizabeth Melton, female , DOB: 1941/11/25 , age 80 y.o. , MRN: [id number] , ADDRESS: [address] [place] [address]   09/04/2019 -   Chief Complaint  Patient presents with   Pulmonary Consult    Pt referred here by Dr. Drema Dallas for abnormal CT chest. Pt c/o DOE X years - states this is stable. Pt denies cough and CP/tightness and f/c/s.      HPI Elizabeth Melton 80 y.o. -presents to the interstitial lung disease clinic for evaluation of pulmonary fibrosis.  She was seen by the lung nodule program approximately a year ago when lung cancer screening CT chest showed interstitial lung disease changes.  The CT scan also showed a 7 mm right middle lobe nodule by the minor fissure.  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  [.  We then asked her to do interstitial lung disease questionnaire and serology and show for the clinic.  However she did not.  She is here today a year later.  In the interim she did have another CT scan of the chest.  The CT scan of the chest is not high-resolution CT chest.  I visualized the scan.  The 7 mm right middle lobe nodule has enlarged.  The ILD changes are still present.  She tells me that she skipped visit a year ago because of insurance issues.  [facility] Comprehensive ILD Questionnaire  Symptoms: Reports insidious onset of shortness of breath for the last few to several years.  He does the same since onset.  She also reports its episodic dyspnea.  SYMPTOM SCALE - ILD 09/04/2019   O2 use ra  Shortness of Breath 0 -> 5 scale with 5 being worst (score 6 If unable to do)  At rest 0  Simple tasks - showers, clothes change, eating, shaving 3  Household (dishes, doing bed, laundry) 3  Shopping 3  Walking level at own pace 3  Walking keeping up with others  of same age 66  Walking up Stairs 4.5  Walking up Hill 4.5  Total (40 - 48) Dyspnea Score x  How bad is your cough? x  How bad is your fatigue x    Her activities are limited by joint pain.  She also reports a cough it is the same since it started.  It is mild in intensity.  Occasionally brings out clear white phlegm.  It does affect her voice and she does clear her throat and she does feel a tickle in her throat.  No hemoptysis.    Past Medical History : Positive for diabetes blood clots not otherwise specified but negative for asthma, COPD, heart failure, rheumatoid arthritis, scleroderma, lupus, polymyositis, Sjogren's, GERD or hiatal hernia, sleep apnea, pulmonary hypertension, stroke.  No seizures.  Denies any heart disease or pleurisy   ROS: Positive for fatigue for the last few years and arthralgia for the last few years.  Also dry mouth for the last few years.  Does admit to snoring for unclear amount of time.  Has nonspecific rash for the last few weeks.  Denies any dysphagia or acid reflux or weight loss or ulcers in the mouth or vagina.   FAMILY HISTORY of LUNG DISEASE: Denies any COPD or pulmonary fibrosis or hypersensitivity pneumonitis   EXPOSURE  HISTORY: Continues to smoke cigarettes since age 43.  He currently smokes 10 cigarettes a day.  She smokes cigars in the past.  Never smoked.  No electronic cigarette use.  No marijuana use currently but did smoke marijuana 20 years ago.  No cocaine use.  No intravenous drug use.   HOME and HOBBY DETAILS : Lives in a single-family [place] home for the last 34 years.  Extensive organic antigen exposure in the house history is negative.  In particular noted to have environment.  No more than will be on the shower curtain.  No mold or mildew anywhere in the house.  Does use a humidifier but no more than it.  Does not use a steam iron.  Does not have a Jacuzzi of swimming pool.  No misting found inside the house.  No pet birds or parakeets.   No pet gerbils.  Does not use feather pillows.  Does not play any wind instruments.  Does not do much gardening.   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  Has worked as a Theme park manager.  Has worked as a Insurance claims handler with cosmetics.  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  Details are not known.  She has worked opening In jars of relaxer chemicals and during this time was exposed with his gas and fumes.  Rest are negative.   PULMONARY TOXICITY HISTORY (27 items):  -Denies         10/23/2019 Follow up : ILD , O2 RF  Patient returns for a 51-monthfollow-up.  She was seen for pulmonary consult last visit for possible pulmonary fibrosis noted on screening CT chest.  Patient is currently an active smoker.  Pulmonary function testing done today shows no airflow obstruction or restriction.  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  Moderate emphysema.  Dilation of pulmonary trunk concerning for pulmonary artery hypertension.  And aortic atherosclerosis. Overnight oximetry test 09/19/19/2020 showed nocturnal desaturations.  Patient was started on oxygen 2 L at nighttime.   Feels that since starting oxygen she is feeling better especially in the morning hours. She says she does get short of breath with activity.  But remains active and is able to do all of her housework and cooking without any difficulty.  Says she does occasionally have to stop and rest.  She does have some cough that is minimally productive. We discussed her test results.  And recent CT chest findings. She does not want to begin meds at this time, did discuss that she would discuss later dater if necessay but does not want to .   We discussed smoking cessation.  Declines pharm referral for smoking cessation   No Known Allergies  Study Conclusions   - Left  ventricle: The cavity size was normal. Wall thickness was    normal. Systolic function was normal. The estimated ejection    fraction was in the range of 60% to 65%. Wall motion was normal;    there were no regional wall motion abnormalities. Normal GLS at    -18%. Doppler parameters are consistent with abnormal left    ventricular relaxation (grade 1 diastolic dysfunction). The E/e&'    ratio is >15, suggesting elevated LV filling pressure.  - Aortic valve: Trileaflet. Sclerosis without stenosis. There was    mild to moderate regurgitation.  - Mitral valve: Mildly thickened leaflets .  There was mild to    moderate regurgitation.  - Left atrium: Moderately dilated.  - Tricuspid valve: There was moderate regurgitation.  - Pulmonary arteries: PA peak pressure: 48 mm Hg (S).  - Inferior vena cava: The vessel was normal in size. The    respirophasic diameter changes were in the normal range (>= 50%),    consistent with normal central venous pressure.   Impressions:   - Compared to a prior study in 2017, there are few changes. There    is mild to moderate AI, MR and moderate TR with a stable RVSP of    48 mmHg.    ROS - per HPI   OV 02/25/2020  Subjective:  Patient ID: Elizabeth Melton, female , DOB: [birth year] , age 73 y.o. 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 -presents for follow-up.  Last visit with me was September 2020.  In the interim followed up with nurse practitioner nov 2020.  Initially was given nighttime oxygen.  She said it  helped but later she is now saying it did not help.  So she discontinued it.  She says it has made no difference.  In the interim she is continue to smoke.  She has significant amount of dyspnea burden as documented below she is also very fatigued.  However she is more bothered right now by her left knee pain and left ankle pain.  She is uses a cane chronically but the pain in her left ankle is been present only for a few days.  She got a talk about this with her primary care physician.  She continues to smoke.  She is not on any inhalers for emphysema.  At last visit with nurse practitioner she was reluctant to take antifibrotic's.     OV 11/22/2020   Subjective:  Patient ID: Elizabeth Melton, female , DOB: 01-Mar-1941, age 70 y.o. years. , MRN: [id number],  ADDRESS: [place] [address] PCP  Leighton Ruff, MD Providers : Treatment Team:  Attending Provider: Brand Males, MD Patient Care Team: Leighton Ruff, MD as PCP - General (Family Medicine) Dorothy Spark, MD as PCP - Cardiology (Cardiology)    Chief Complaint  Patient presents with   Follow-up    Breathing is unchanged since her last visit.     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 -returns for her ILD follow-up.  Last seen in spring 2021.  After that a try to set her up for a right heart catheterization but in the interim she got pulmonary embolism in September 2021.  She has seen Dr. Jenetta Downer for sleep apnea work-up.  She continues to smoke.  For associated emphysema she takes Spiriva.  For her ILD she continues to be reluctant to have any biopsy or take antifibrotic's.  She expresses again to me today.  She does not want a right heart catheterization either.  On the other hand she is willing to have an expectant follow-up with serial monitoring.  This is because she is afraid of side effects.  Overall she is stable.  Not using oxygen.  She is more limited by sciatic pain in her feet.  This was evidenced in a walking desaturation test today.  Symptom scores are listed below.  In terms of pulmonary embolism: We discussed potential risk factors.  She denies any family history but it appears obesity sedentary lifestyle pulmonary fibrosis and ongoing smoking or risk factors.  I counseled her about quitting smoking.  She plans to do that.   Results for PAMELYN, BANCROFT (MRN [id number]) as of 02/25/2020 12:29  Ref. Range 09/04/2019 14:19  CK Total Latest Ref Range: 29 - 143 U/L 196 (H)   Results for MICAL, BRUN (MRN [id number]) as of 02/25/2020 12:29  Ref. Range 09/04/2019 14:19  Anti Nuclear Antibody (ANA) Latest Ref Range: NEGATIVE  POSITIVE (A)  ANA Pattern 1 Unknown Cytoplasmic (A)  ANA Titer 1 Latest Units: titer 1:80 (H)  Results for ANALISSA, BAYLESS (MRN [id number]) as of 02/25/2020 12:29  Ref. Range 09/04/2019 14:19  Sed Rate Latest Ref Range: 0 - 30 mm/hr 40 (H)       OV 06/02/2021  Subjective:  Patient  ID: Elizabeth Melton, female , DOB: 21-Feb-1941 , age 55 y.o. , MRN: [id number] , ADDRESS: [place] [address] PCP Kathyrn Lass, MD Patient Care Team: Kathyrn Lass, MD as PCP - General (Family Medicine) [name], MD as PCP - Cardiology (Cardiology)  This Provider for this visit: Treatment Team:  Attending Provider: Brand Males, MD    06/02/2021 -   Chief Complaint  Patient presents with   Follow-up    Pt states that she is about the same since last visit. Still becomes SOB with exertion and has an occ cough.   HPI Elizabeth Melton 80 y.o. -last seen in December 2021.  She was supposed to see me in the spring 2022 with CT scan and PFT but this did not get done.  Review of the chart indicates she had a diagnosis of systolic heart failure in February 2022.  She had another CT angiogram chest that ruled out pulmonary embolism.  Then she had another admission in June 2022 with acute on chronic heart failure exacerbation but this time the echo showed diastolic dysfunction grade 2 and severe pulmonary hypertension.  She was admitted.  She had both pulmonary and cardiology consultation.  Somewhere along the way according to her history in April 2022 ended up on 3 L oxygen and also using a Rollator.  She got discharged to her daughter's home.  She currently feeling  improved and stable requiring 3 L continuous.  Her functional status is deteriorated although she is feeling better compared to the hospitalization.  Her son Elberta Fortis is here with her for the first time meeting him.  He is unaware of pulmonary cardiac issues in detail.  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  After this discussion she seems more willing to entertain the concept of antifibrotic's.  At this point in time with oxygen need lung biopsy could be high risk.  We talked about right heart catheterization and she is  open to the idea.  I sent a message Dr. Beatrix Fetters her cardiologist.      CT Chest data  ECHOCARDIOGRAM COMPLETE  Result Date: 05/30/2021    ECHOCARDIOGRAM REPORT   Patient Name:   TERALYN MULLINS Date of Exam: 05/30/2021 Medical Rec #:  [id number]          Height:       66.0 in Accession #:    [id number]         Weight:       170.0 lb Date of Birth:  03/16/1941          BSA:          1.866 m Patient Age:    4 years           BP:           142/50 mmHg Patient Gender: F                  HR:           59 bpm. Exam Location:  [facility] Procedure: 2D Echo, Cardiac Doppler and Color Doppler Indications:    I50.9 Congestive heart failure  History:        Patient has prior history of Echocardiogram examinations, most                 recent 05/19/2021. Risk Factors:Hypertension, Diabetes and                 Dyslipidemia. Hypothyroidism. Acute systolic heart failure.  Sonographer:    Wilford Sports Rodgers-Jones RDCS Referring Phys: [id number] [place]  1. Left ventricular ejection fraction, by estimation, is 60 to 65%. The left ventricle has normal function. The left ventricle has no regional wall motion abnormalities. Left ventricular diastolic parameters are consistent with Grade II diastolic dysfunction (pseudonormalization). Elevated left ventricular end-diastolic pressure.  2. Right ventricular systolic function is low normal. The right ventricular size is mildly enlarged. There is severely elevated pulmonary artery systolic pressure. The estimated right ventricular systolic pressure is 09.3 mmHg.  3. Left atrial size was moderately dilated.  4. Right atrial size was mild to moderately dilated.  5. The mitral valve is degenerative. Moderate mitral valve regurgitation. No evidence of mitral stenosis. Moderate mitral annular calcification.  6. Tricuspid valve regurgitation is moderate.  7. The aortic valve is calcified. There is moderate calcification of the aortic valve. There is  moderate thickening of the aortic valve. Aortic valve regurgitation is mild to moderate. Mild aortic valve stenosis.  8. Aortic dilatation noted. There is borderline dilatation of the ascending aorta, measuring 38 mm. Comparison(s): No significant change from prior study. FINDINGS  Left Ventricle: Left ventricular ejection fraction, by estimation, is 60 to 65%. The left ventricle has normal function. The left ventricle has no regional wall motion abnormalities. The left ventricular internal cavity size was  normal in size. There is  no left ventricular hypertrophy. Left ventricular diastolic parameters are consistent with Grade II diastolic dysfunction (pseudonormalization). Elevated left ventricular end-diastolic pressure. Right Ventricle: The right ventricular size is mildly enlarged. Right vetricular wall thickness was not well visualized. Right ventricular systolic function is low normal. There is severely elevated pulmonary artery systolic pressure. The tricuspid regurgitant velocity is 3.69 m/s, and with an assumed right atrial pressure of 8 mmHg, the estimated right ventricular systolic pressure is 34.9 mmHg. Left Atrium: Left atrial size was moderately dilated. Right Atrium: Right atrial size was mild to moderately dilated. Pericardium: There is no evidence of pericardial effusion. Mitral Valve: The mitral valve is degenerative in appearance. There is mild thickening of the mitral valve leaflet(s). There is mild calcification of the mitral valve leaflet(s). Moderate mitral annular calcification. Moderate mitral valve regurgitation.  No evidence of mitral valve stenosis. Tricuspid Valve: The tricuspid valve is normal in structure. Tricuspid valve regurgitation is moderate . No evidence of tricuspid stenosis. Aortic Valve: The aortic valve is calcified. There is moderate calcification of the aortic valve. There is moderate thickening of the aortic valve. Aortic valve regurgitation is mild to moderate. Aortic  regurgitation PHT measures 625 msec. Mild aortic stenosis is present. Aortic valve mean gradient measures 7.2 mmHg. Aortic valve peak gradient measures 15.2 mmHg. Aortic valve area, by VTI measures 1.24 cm. Pulmonic Valve: The pulmonic valve was not well visualized. Pulmonic valve regurgitation is not visualized. No evidence of pulmonic stenosis. Aorta: Aortic dilatation noted. There is borderline dilatation of the ascending aorta, measuring 38 mm. Venous: The inferior vena cava was not well visualized. IAS/Shunts: The atrial septum is grossly normal.  LEFT VENTRICLE PLAX 2D LVIDd:         4.40 cm  Diastology LVIDs:         2.92 cm  LV e' medial:    5.77 cm/s LV PW:         1.00 cm  LV E/e' medial:  20.6 LV IVS:        1.00 cm  LV e' lateral:   5.98 cm/s LVOT diam:     1.70 cm  LV E/e' lateral: 19.9 LV SV:         54 LV SV Index:   29 LVOT Area:     2.27 cm  RIGHT VENTRICLE RV Basal diam:  4.30 cm RV S prime:     12.70 cm/s TAPSE (M-mode): 1.7 cm LEFT ATRIUM             Index       RIGHT ATRIUM           Index LA diam:        5.10 cm 2.73 cm/m  RA Area:     18.30 cm LA Vol (A2C):   77.0 ml 41.26 ml/m RA Volume:   49.90 ml  26.74 ml/m LA Vol (A4C):   96.4 ml 51.66 ml/m LA Biplane Vol: 88.5 ml 47.42 ml/m  AORTIC VALVE AV Area (Vmax):    1.14 cm AV Area (Vmean):   1.21 cm AV Area (VTI):     1.24 cm AV Vmax:           195.25 cm/s AV Vmean:          128.250 cm/s AV VTI:            0.436 m AV Peak Grad:      15.2 mmHg AV Mean Grad:  7.2 mmHg LVOT Vmax:         97.90 cm/s LVOT Vmean:        68.400 cm/s LVOT VTI:          0.238 m LVOT/AV VTI ratio: 0.55 AI PHT:            625 msec  AORTA Ao Root diam: 3.10 cm Ao Asc diam:  3.80 cm MITRAL VALVE                TRICUSPID VALVE MV Area (PHT): 3.60 cm     TR Peak grad:   54.5 mmHg MV Decel Time: 211 msec     TR Vmax:        369.00 cm/s MR Peak grad: 136.4 mmHg MR Vmax:      584.00 cm/s   SHUNTS MV E velocity: 119.00 cm/s  Systemic VTI:  0.24 m MV A velocity:  97.00 cm/s   Systemic Diam: 1.70 cm MV E/A ratio:  1.23 Buford Dresser MD Electronically signed by Buford Dresser MD Signature Date/Time: 05/30/2021/3:42:20 PM    Final       OV 08/09/2021  Subjective:  Patient ID: Elizabeth Melton, female , DOB: August 29, 1941 , age 89 y.o. 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 Haroldine Laws or Mclean in spring 2021 but then held off dur to PE in sept 2021  - Dr Dorris Fetch is her cardiologist in [id number]   Chronic systolic heart failure with most recent echocardiogram in February 2022 showing LVEF 40 to 45%,  - > resolved June 2022   Submassive pulmonary embolism September 2021 with RV LV ratio 1.5 and saddle pulmonary emboli  -On Eliquis  July 2022 right heart catheterization -   Severe Mixed Pulmonary Hypertension-improved with oxygen: TPG 19 mmHg Mean PAP 47 mmHg this is a 36 mmHg with 3 L [place] PCWP  elevated at 28 mmHg Severely reduced Cardiac Output-Index by both Fick (3.04-1.65) and Thermodilution (3.96-2.15)  modestly improves with 3L [place] O2 (3.21-1.74) Significant oxygen responsiveness-SaO2 dropped to 83% on room air, improved to 98% on 3L [place] oxygen.      08/09/2021 -   Chief Complaint  Patient presents with   Follow-up    Pt was recently admitted to the hospital. States she has been doing better since she has been out. Still becomes SOB with exertion.     HPI Elizabeth Melton 80 y.o. -she had right heart catheterization in July 2022.  This showed mixed pulmonary artery hypertension and pulmonary venous hypertension.  She had a high wedge.  Her oral Lasix was increased as outpatient but she did not take it because of concern of increased frequency of urination.  This then made a more hypoxemic and she got hospitalized for a few days early August 2022.  She was discharged on a prednisone taper that she is completed.  She her Lasix was increased and she was also given IV Lasix.  This helped improve her.  Currently her symptoms are back to baseline although she is requiring more like 4 L oxygen based on subjective needs.  Her smoking has relapsed.  She did have a CT scan of the chest without contrast in the hospital and the features of changed from probable UIP to definitive UIP.  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  Consistent with this the pulmonary function test today shows decline.  Therefore she is got progressive ILD which is again fitting in with IPF.  Unfortunately she has began to smoke again.  Smoking 3 to 4 cigarettes/day.  Spoke to her extensively about the need to quit.  She really wants improvement in her symptoms.  We discussed inhaled treprostinil.  I am not sure she understood in detail.  Her son Elberta Fortis was not at the room today with her.  On one side she says she will do anything to make her feel better but there is a history of medication noncompliance and also concerns with  side effects.  Given the mixed picture her pulmonary hypertension could be group 2 from her left heart disease, could be group 3 from pulmonary fibrosis or could be group for from chronic thromboembolic disease [she has submassive PE a year ago in September 2021] therefore I suggested that when she sees cardiologist she has entertain the possibility of a repeat right heart catheterization after ensuring full compliance with Lasix.  She understood this.  We discussed antifibrotic's.  She seemed more open but then when I mention the side effect she was not that open to the idea.  Most recent renal function in August 2022 was normal   [facility] 07/05/21 Hemodynamic findings consistent with severe pulmonary hypertension.   Severe Mixed Pulmonary Hypertension-improved with oxygen: TPG 19 mmHg Mean PAP 47 mmHg this is a 36 mmHg with 3 L [place] PCWP elevated at 28 mmHg Severely reduced Cardiac Output-Index by both Fick (3.04-1.65) and Thermodilution (3.96-2.15)  modestly improves with 3L [place] O2 (3.21-1.74) Significant oxygen responsiveness-SaO2 dropped to 83% on room air, improved to 98% on 3L [place] oxygen.     Glenetta Hew, MD     SYMPTOM SCALE - ILD 02/25/2020  11/22/2020  06/02/2021  08/09/2021   O2 use ra ra 3L [place] since April 2022 along with rollator 4 LNC - admit earlyu aug 2022. RHC  - mixed grp 2-3 pah July 2022  Shortness of Breath 0 -> 5 scale with 5 being worst (score 6 If unable to do)     At rest 1.5 0 1 0  Simple tasks - showers, clothes change, eating, shaving _0 Household (dishes, doing bed, laundry) 2._1 Shopping 3._2 Walking level at own pace 3._3 Walking up Stairs 4._4 Total (30-36) Dyspnea Score 18._5 How bad is your cough? 0 0 0 0  How bad is your fatigue 2.5 3  2.5  How bad is nausea 0 0  none  How bad is vomiting?  0 0  0  How bad is diarrhea? 0 2  0  How bad is anxiety? 0 2  2.5  How bad is depression 0 2  2.5         Simple  office walk 185 feet x  3 laps goal with forehead probe 09/04/2019  02/25/2020  11/22/2020   O2 used RA ra ra  Number laps completed 3 attempted but at stopped at 1 due to dyspnea an left knee pain Did not walk due to left heel pain. Has cane Did only 1 lap - stopped due to feet burn  Comments about pace slow    Resting Pulse Ox/HR 99% and 66/min  97% and 77/min  Final Pulse Ox/HR 93% and 105/min  94% and 105/min  Desaturated </= 88% No but did not do all 3  No,  Desaturated <= 3% points yes  Uyes, 3 points  Got Tachycardic >/= 90/min Yes, 6 points  yes  Symptoms at end of test Dyspnea and left knee pain  Stopped due to feet pain and some dyspnea but mostly feet  Miscellaneous comments Stopped early due to knee pain and dyspnea      PFT  PFT Results Latest Ref Rng & Units 08/01/2021 03/17/2020 10/23/2019  FVC-Pre L 1.80 2.15 2.10  FVC-Predicted Pre % 89 103 100  Pre FEV1/FVC % % 66 74 73  FEV1-Pre L 1.18 1.59 1.53  FEV1-Predicted Pre % 76 99 95  DLCO uncorrected ml/min/mmHg 5.69 8.93 8.62  DLCO UNC% % 30 47 46  DLCO corrected ml/min/mmHg 5.69 8.93 -  DLCO COR %Predicted % 30 47 -  DLVA Predicted % 46 72 59       has a past medical history of Acute on chronic diastolic (congestive) heart failure ([place]) (05/19/2021), Diabetes mellitus without complication ([place]), Hyperlipidemia, Hypertension, Hypothyroidism, IPF (idiopathic pulmonary fibrosis) ([place]), and Thrombocytopenia ([place]).   reports that she has quit smoking. Her smoking use included cigarettes. She started smoking about 62 years ago. She has a 60.00 pack-year smoking history. She quit smokeless tobacco use about 58 years ago.  Past Surgical History:  Procedure Laterality Date   RIGHT HEART CATH N/A 07/05/2021   Procedure: RIGHT HEART CATH;  Surgeon: [name], MD;  Location: [facility];  Service: Cardiovascular;  Laterality: N/A;   TUBAL LIGATION      Allergies  Allergen Reactions   Losartan Other (See  Comments)    Hallucinations     Immunization History  Administered Date(s) Administered   Pneumococcal Conjugate-13 06/03/2014   Pneumococcal Polysaccharide-23 06/25/2007    Family History  Problem Relation Age of Onset   Diabetes Mother    Kidney disease Mother    Hypertension Mother    Other Father        tuberculosis   Hypertension Sister    Hyperlipidemia Sister      Current Outpatient Medications:    acetaminophen (TYLENOL) 500 MG tablet, Take 500-1,000 mg by mouth every 8 (eight) hours as needed for headache or mild pain (pain)., Disp: , Rfl:    apixaban (ELIQUIS) 5 MG TABS tablet, Take 1 tablet (5 mg total) by mouth 2 (two) times daily., Disp: 56 tablet, Rfl: 0   b complex vitamins tablet, Take 1 tablet by mouth daily., Disp: , Rfl:    Blood Glucose Monitoring Suppl (TRUE METRIX METER) w/Device KIT, 1 each by Other route in the morning and at bedtime., Disp: , Rfl:    buPROPion (WELLBUTRIN XL) 150  MG 24 hr tablet, Take 150 mg by mouth daily., Disp: , Rfl:    Calcium Carb-Cholecalciferol (CALCIUM + D3) 600-200 MG-UNIT TABS, Take 2 tablets by mouth daily. , Disp: , Rfl:    cholecalciferol (VITAMIN D3) 25 MCG (1000 UT) tablet, Take 1,000 Units by mouth daily., Disp: , Rfl:    furosemide (LASIX) 40 MG tablet, Take 1 tablet (40 mg total) by mouth daily., Disp: 90 tablet, Rfl: 1   glucose blood test strip, 1 each by Other route as needed for other (blood sugar)., Disp: , Rfl:    insulin aspart (NOVOLOG) 100 UNIT/ML FlexPen, Insulin sliding scale: Blood sugar  120-150   3units                       151-200   4units                       201-250   7units                       251- 300  11units                       301-350   15uints                       351-400   20units                       >400         call MD immediately, Disp: 15 mL, Rfl: 0   insulin glargine (LANTUS) 100 UNIT/ML injection, Inject 10 Units into the skin daily., Disp: , Rfl:    Insulin Pen Needle (PEN NEEDLES  29GX1/2") 29G X 12MM MISC, For insulin injection, Disp: 50 each, Rfl: 0   levothyroxine (SYNTHROID, LEVOTHROID) 75 MCG tablet, Take 75 mcg by mouth daily before breakfast. , Disp: , Rfl:    lovastatin (MEVACOR) 40 MG tablet, Take 40 mg by mouth at bedtime., Disp: , Rfl:    metoprolol succinate (TOPROL-XL) 25 MG 24 hr tablet, Take 0.5 tablets (12.5 mg total) by mouth daily., Disp: 30 tablet, Rfl: 3   nicotine (NICODERM CQ - DOSED IN MG/24 HOURS) 14 mg/24hr patch, Place 21 mg onto the skin daily., Disp: , Rfl:    Omega-3 Fatty Acids (FISH OIL PO), Take 1,000 mg by mouth daily., Disp: , Rfl:    OXYGEN, Inhale 3 L into the lungs continuous., Disp: , Rfl:    potassium chloride (KLOR-CON) 10 MEQ tablet, TAKE 1 TABLET EVERY DAY, Disp: 90 tablet, Rfl: 1   predniSONE (DELTASONE) 10 MG tablet, Take 2 tab 20 mg daily for 7 days, then take 10 mg daily., Disp: 30 tablet, Rfl: 1   SMART SENSE THIN LANCETS 26G MISC, 1 each by Does not apply route 2 (two) times daily., Disp: , Rfl:    insulin glargine (LANTUS) 100 UNIT/ML Solostar Pen, Inject 10 Units into the skin daily for 10 days. (Patient taking differently: Inject 10 Units into the skin at bedtime.), Disp: 15 mL, Rfl: 0   senna-docusate (SENOKOT-S) 8.6-50 MG tablet, Take 1 tablet by mouth at bedtime as needed for mild constipation. (Patient not taking: Reported on 08/09/2021), Disp: 14 tablet, Rfl: 0   Tiotropium Bromide-Olodaterol (STIOLTO RESPIMAT) 2.5-2.5 MCG/ACT AERS, Inhale 2 puffs into the lungs daily., Disp: 12 g, Rfl: 3  Current Facility-Administered Medications:  sodium chloride flush (NS) 0.9 % injection 3 mL, 3 mL, Intravenous, Q12H, [name], MD      Objective:   Vitals:   08/09/21 1154  BP: 126/60  Pulse: 62  Temp: 98 F (36.7 C)  TempSrc: Oral  SpO2: 97%  Weight: 161 lb (73 kg)  Height: 5' 6" (1.676 m)    Estimated body mass index is 25.99 kg/m as calculated from the following:   Height as of this encounter: 5'  6" (1.676 m).   Weight as of this encounter: 161 lb (73 kg).  _0 @  Filed Weights   08/09/21 1154  Weight: 161 lb (73 kg)     Physical Exam Frail thin female has a Rollator.  Has crackles.  No edema but there is evidence of chronic venous stasis in the skin with discoloration of the lower extremities.  No elevated JVP.  Looks stable.      Assessment:       ICD-10-CM   1. IPF (idiopathic pulmonary fibrosis) ([place])  J84.112     2. ILD (interstitial lung disease) (H[facility])  J84.9 Pulmonary function test    3. Solitary pulmonary nodule  R91.1     4. Tobacco abuse  Z72.0     5. WHO group 2 pulmonary arterial hypertension (HCC)  I27.22     6. WHO group 3 pulmonary arterial hypertension (HCC)  I27.23     7. Chronic respiratory failure with hypoxia (H[facility])  J96.11     8. Non compliance w medication regimen  Z91.14          Plan:     Patient Instructions  Lung nodule - 8m Right Middle Lobe Sept 2019 -> Aug 2020 with increase - stable Nov 2020 -> no comment on CT June 2022  Plan  -as needed followup  Smoking  -  glad you quit but now relapsed 3-4 cigs  perday  Plan  - please quit (3 min counselnig done)  Interstitial Lung Disease/UIP/IPF  - you have  A variety called IPF  ; this is generally progerssive and there is evidence for progression - Previously reluctant to undergo biopsy or start antifibrotic's   - Noted June and August 2022 that you are increasingly willing to reconsider  anti fibrotics though not fully and still have reservations about side effects - smoking can reduce efficancy of anti fibotics  PLAN -  -Do spirometry and DLCO in  3 motnths - continue 3L [place] - 4L [place] - bring son to discus anti fibrotics at next visi   Pulmonary emphysema   Stable disease but noted in hospital they gave you prednisone burst that you completed  Plan -Continue Spiriva - continue albuterol as neded   Pulmonary artery enlargement on CT and Pulmonary  hypertension - WHO group 3 and group 2 and possibley Group 4 (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)  -Heart catheterization held off in August/September 2021 due to blood clot in the lung in September 2021 - Echo June 2022 with continued severe pulmonary hypertension - Right heart cath in July 2022 showed severe pulmonary hypertension due to heart and lungissues  - you seem better from heart after admission august 2022 and lasix  Plan- -continue Eliquis - - do VQ Scan (addendum instruction) for chronic PE - might need repeat right heart cath to ensure heart is fully optimized before csondiering an inhaler called tyvaso for this condition   Chronic respiratory failure with o2 need due to above  Plan  - continue  3-4 L [place]  Followup   - 28-month but after completing the above; Dr. RChase Callerin 30-minute slot  - bring son AElberta Fortis ( Level 05 visit: Estb 40-54 min   in  visit type: on-site physical face to visit  in total care time and counseling or/and coordination of care by this undersigned MD - Dr MBrand Males This includes one or more of the following on this same day 08/09/2021: pre-charting, chart review, note writing, documentation discussion of test results, diagnostic or treatment recommendations, prognosis, risks and benefits of management options, instructions, education, compliance or risk-factor reduction. It excludes time spent by the [facility] office staff in the care of the patient. Actual time 40 min)   SIGNATURE    Dr. MBrand Males M.D., F.C.C.P,  Pulmonary and Critical Care Medicine Staff Physician, CKeysvilleDirector - Interstitial Lung Disease  Program  Pulmonary [place] [id number] Pager: [phone] If no answer or between  15:00h - 7:00h: call [phone] Telephone: [phone]  12:47 PM 08/09/2021

## 2021-08-09 NOTE — Telephone Encounter (Signed)
Attempted to call pt but unable to reach. Left message for her to return call. 

## 2021-08-10 DIAGNOSIS — H52209 Unspecified astigmatism, unspecified eye: Secondary | ICD-10-CM | POA: Diagnosis not present

## 2021-08-10 DIAGNOSIS — H524 Presbyopia: Secondary | ICD-10-CM | POA: Diagnosis not present

## 2021-08-10 DIAGNOSIS — J849 Interstitial pulmonary disease, unspecified: Secondary | ICD-10-CM | POA: Diagnosis not present

## 2021-08-10 DIAGNOSIS — I10 Essential (primary) hypertension: Secondary | ICD-10-CM | POA: Diagnosis not present

## 2021-08-10 DIAGNOSIS — Z794 Long term (current) use of insulin: Secondary | ICD-10-CM | POA: Diagnosis not present

## 2021-08-10 DIAGNOSIS — E1169 Type 2 diabetes mellitus with other specified complication: Secondary | ICD-10-CM | POA: Diagnosis not present

## 2021-08-10 DIAGNOSIS — Z79899 Other long term (current) drug therapy: Secondary | ICD-10-CM | POA: Diagnosis not present

## 2021-08-10 DIAGNOSIS — J449 Chronic obstructive pulmonary disease, unspecified: Secondary | ICD-10-CM | POA: Diagnosis not present

## 2021-08-10 DIAGNOSIS — Z6827 Body mass index (BMI) 27.0-27.9, adult: Secondary | ICD-10-CM | POA: Diagnosis not present

## 2021-08-10 DIAGNOSIS — H5203 Hypermetropia, bilateral: Secondary | ICD-10-CM | POA: Diagnosis not present

## 2021-08-11 ENCOUNTER — Ambulatory Visit: Payer: Medicare HMO

## 2021-08-11 NOTE — Telephone Encounter (Signed)
Spoke with the pt  She is already scheduled for 2 month appt with MR with PFT  She was agreeable to VQ scan and I have sent order for this  Nothing further needed

## 2021-08-13 DIAGNOSIS — H5203 Hypermetropia, bilateral: Secondary | ICD-10-CM | POA: Diagnosis not present

## 2021-08-16 ENCOUNTER — Other Ambulatory Visit: Payer: Self-pay

## 2021-08-16 IMAGING — CR DG CHEST 2V
2 series · 2 of 2 positions shown · non-contrast
Comparison: 05/18/2021

CLINICAL DATA: Shortness of breath.

EXAM:
CHEST - 2 VIEW

[chest lat]
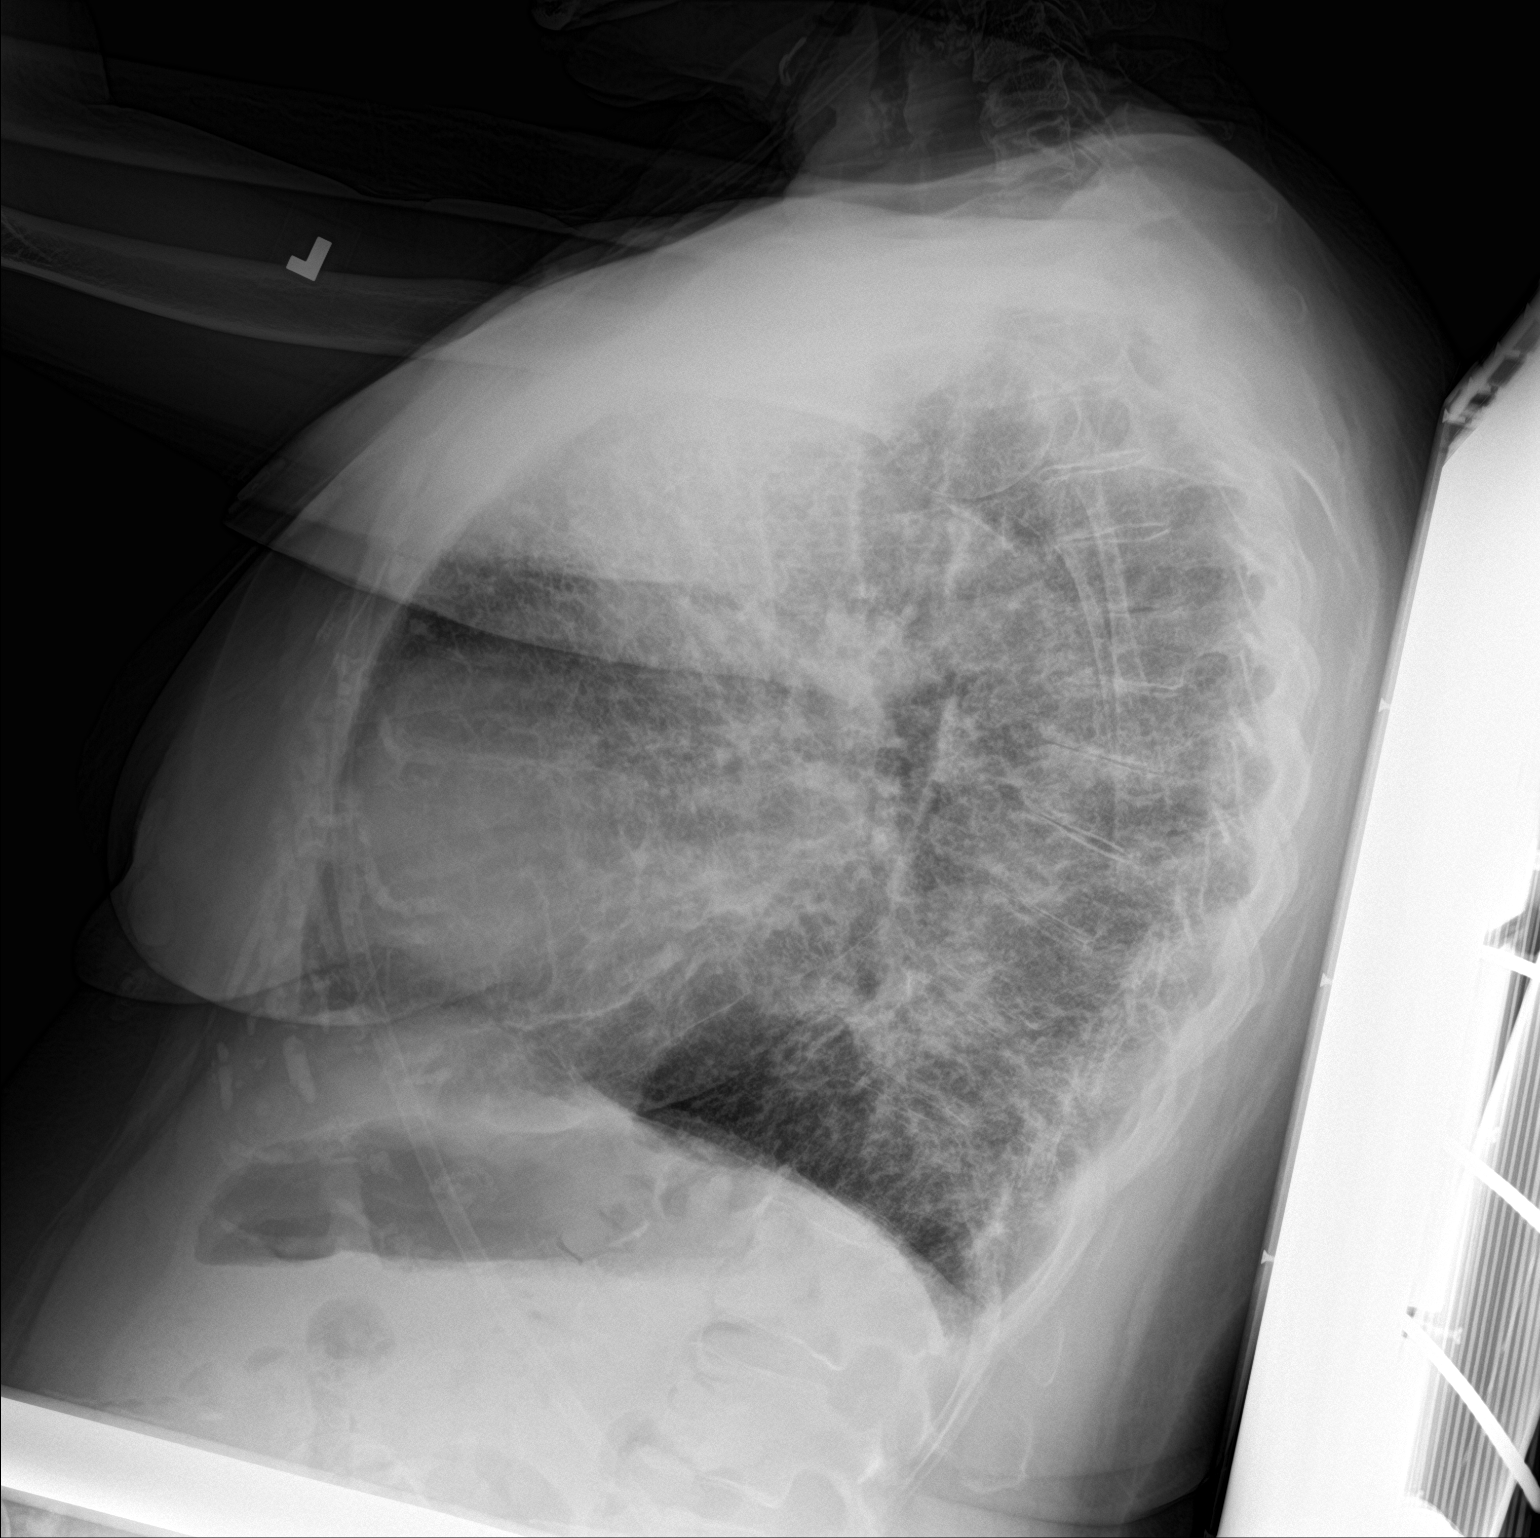

[chest ap]
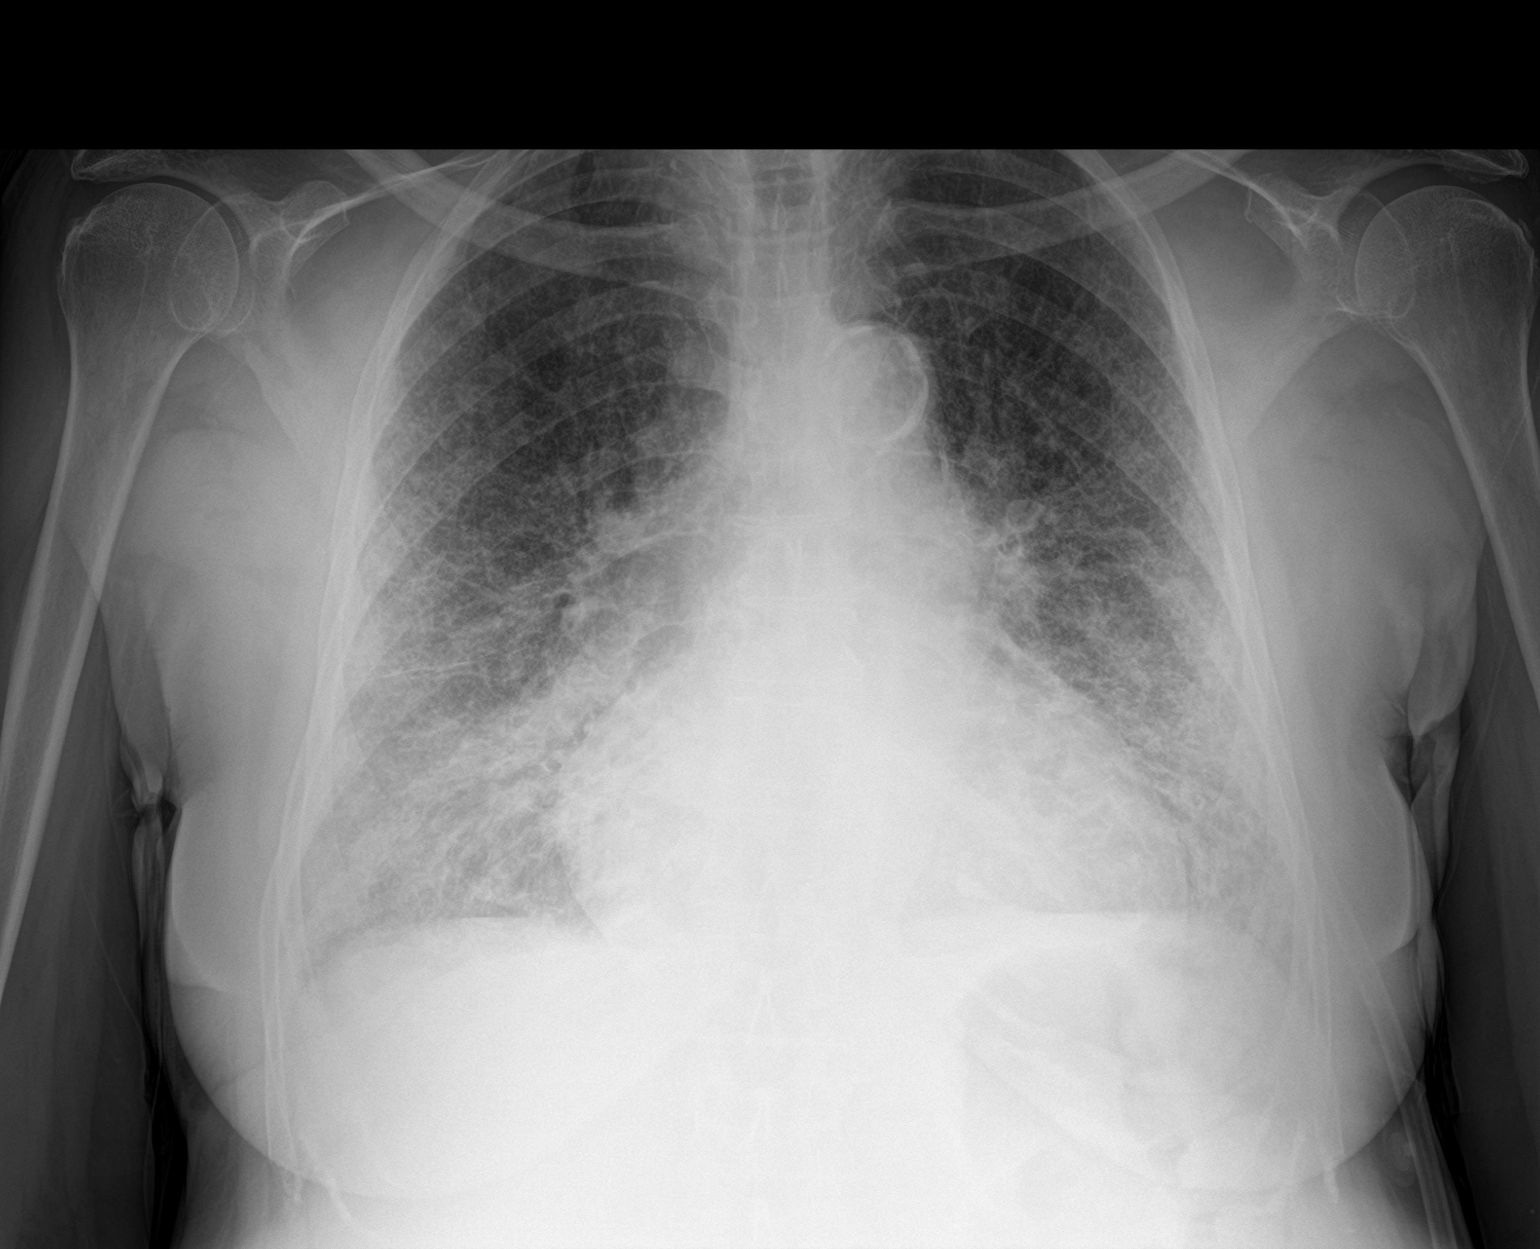

[2 of 2 positions shown; findings below may reference images not displayed]

FINDINGS: The heart is mildly enlarged but stable. Stable tortuosity and
calcification of the thoracic aorta.

Underlying emphysematous changes and interstitial lung disease with
suspected superimposed interstitial process. This could represent
interstitial edema superimposed bronchitis. No definite pleural
effusions. No focal infiltrates. No pneumothorax.
IMPRESSION: Underlying emphysematous changes and interstitial lung disease with
suspected superimposed interstitial edema or bronchitis.

## 2021-08-16 NOTE — Patient Outreach (Addendum)
[facility]) Care Management  08/16/2021  Elizabeth Melton 1941-05-26 [id number]   Telephone Assessment  Telephone Screen  Referral Date:08/16/2021 Referral Source:Nurse Call Center Referral Reason: "Patient was requesting a physician see her in her home.  She has experienced dizziness since last night and has tingling in her legs She reports blurred vision but when she closes her left eye she does not have any blurred vision.  She reports tingling in her right hand when urinating.  She reports numbness in her right face.  She denies any shortness of air or chest pain.  I suggested she call EMS 911 now and she is refusing to call 911.  She states she will not go back to the hospital."    Outreach attempt # 1 to patient. Spoke with patient dn discussed recent call to nurse line. Patient voices that she went to new eye doctor on last week. She had eye exam and MD injected her eyes with some eye drops. Since then she has been having blurry vision and some dizziness. She denies any neuro, cardiac and/or resp sxs. She tried calling office on yesterday to report sxs but they were closed. Advised patient office was probably closed due to holiday. She will call office now to alert them and inquire what she should do. She voices that MD also prescribed some eye gtts for her but she did not go get prescription filled yet. She will verify if she still needs to get Rx filled. Patient states she was trying to find and eye office opened yesterday but couldn't and that's why she was trying to see if someone could come out to her home but is aware that is not possible. She denies any other RN CM needs or concerns at this. She has medical follow up appt tomorrow.    Goals Addressed               This Visit's Progress     [facility]) Obtain Eye Exam-Diabetes Type 2 (pt-stated)         Timeframe:  Short-Term Goal Priority:  High Start Date:  08/16/21                           Expected End Date:  Oct 2022                       Follow Up Date: Oct 2022   - keep appointment with eye doctor - schedule appointment with eye doctor    Why is this important?   Eye check-ups are important when you have diabetes.  Vision loss can be prevented.    Notes:   08/16/21-Patient reports she went for her annual eye exam on last week. Since then she has been having blurry vision and dizziness after MD placed eye drops in eyes. She will call office today to follow up and see what she needs to do. She was given Rx for eye gtts which she did not get filled yet.          Plan: RN CM discussed with patient next outreach within 4-6wks. Patient agrees to care plan and follow up. Patient gave verbal consent and in agreement with RN CM follow up and timeframe. Patient aware that they may contact RN CM sooner for any issues or concerns. RN CM reviewed goals and plan of care with patient.  Enzo Montgomery, RN,BSN,CCM [facility] Care Management Telephonic Care Management Coordinator Direct  Phone: [phone] Toll Free: [phone] Fax: [phone]

## 2021-08-17 ENCOUNTER — Other Ambulatory Visit: Payer: Self-pay

## 2021-08-17 ENCOUNTER — Ambulatory Visit (HOSPITAL_COMMUNITY)
Admission: RE | Admit: 2021-08-17 | Discharge: 2021-08-17 | Disposition: A | Discharge status: Home / Self Care | Payer: Medicare HMO | Source: Ambulatory Visit | Attending: Internal Medicine | Admitting: Internal Medicine

## 2021-08-17 ENCOUNTER — Encounter (HOSPITAL_COMMUNITY)
Admission: RE | Admit: 2021-08-17 | Discharge: 2021-08-17 | Disposition: A | Discharge status: Home / Self Care | Payer: Medicare HMO | Source: Ambulatory Visit | Attending: Internal Medicine | Admitting: Internal Medicine

## 2021-08-17 DIAGNOSIS — J849 Interstitial pulmonary disease, unspecified: Secondary | ICD-10-CM

## 2021-08-17 DIAGNOSIS — I517 Cardiomegaly: Secondary | ICD-10-CM | POA: Diagnosis not present

## 2021-08-17 DIAGNOSIS — J449 Chronic obstructive pulmonary disease, unspecified: Secondary | ICD-10-CM | POA: Diagnosis not present

## 2021-08-17 DIAGNOSIS — I700 Atherosclerosis of aorta: Secondary | ICD-10-CM | POA: Diagnosis not present

## 2021-08-17 MED ORDER — TECHNETIUM TO 99M ALBUMIN AGGREGATED
4.4000 | Freq: Once | INTRAVENOUS | Status: AC | PRN
  Administered 2021-08-17: 4.4 via INTRAVENOUS

## 2021-08-21 DIAGNOSIS — M810 Age-related osteoporosis without current pathological fracture: Secondary | ICD-10-CM | POA: Diagnosis not present

## 2021-08-21 DIAGNOSIS — J449 Chronic obstructive pulmonary disease, unspecified: Secondary | ICD-10-CM | POA: Diagnosis not present

## 2021-08-21 DIAGNOSIS — E039 Hypothyroidism, unspecified: Secondary | ICD-10-CM | POA: Diagnosis not present

## 2021-08-21 DIAGNOSIS — I10 Essential (primary) hypertension: Secondary | ICD-10-CM | POA: Diagnosis not present

## 2021-08-21 DIAGNOSIS — I4891 Unspecified atrial fibrillation: Secondary | ICD-10-CM | POA: Diagnosis not present

## 2021-08-21 DIAGNOSIS — E1165 Type 2 diabetes mellitus with hyperglycemia: Secondary | ICD-10-CM | POA: Diagnosis not present

## 2021-08-21 DIAGNOSIS — E1169 Type 2 diabetes mellitus with other specified complication: Secondary | ICD-10-CM | POA: Diagnosis not present

## 2021-08-21 DIAGNOSIS — E785 Hyperlipidemia, unspecified: Secondary | ICD-10-CM | POA: Diagnosis not present

## 2021-08-26 DIAGNOSIS — I4891 Unspecified atrial fibrillation: Secondary | ICD-10-CM | POA: Diagnosis not present

## 2021-08-26 DIAGNOSIS — J849 Interstitial pulmonary disease, unspecified: Secondary | ICD-10-CM | POA: Diagnosis not present

## 2021-09-12 ENCOUNTER — Other Ambulatory Visit: Payer: Self-pay | Admitting: *Deleted

## 2021-09-12 ENCOUNTER — Other Ambulatory Visit: Payer: Self-pay

## 2021-09-12 DIAGNOSIS — F17210 Nicotine dependence, cigarettes, uncomplicated: Secondary | ICD-10-CM

## 2021-09-12 DIAGNOSIS — Z87891 Personal history of nicotine dependence: Secondary | ICD-10-CM

## 2021-09-12 NOTE — Patient Outreach (Signed)
[facility]) Care Management  09/12/2021  Elizabeth Melton 28-Sep-1941 [id number]   Telephone Assessment  Successful outreach call paled to patient. She reports she is doing well-currently sitting outside on porch with her son who is visiting with her. She denies any RN CM needs or concerns at this time.     Medications Reviewed Today     Reviewed by [name], RN (Registered Nurse) on 09/12/21 at [place] List Status: <None>   Medication Order Taking? Sig Documenting Provider Last Dose Status Informant  acetaminophen (TYLENOL) 500 MG tablet [id number] No Take 500-1,000 mg by mouth every 8 (eight) hours as needed for headache or mild pain (pain). [provider] Taking Active Self  apixaban (ELIQUIS) 5 MG TABS tablet [id number] No Take 1 tablet (5 mg total) by mouth 2 (two) times daily. [name], NP Taking Active   b complex vitamins tablet [id number] No Take 1 tablet by mouth daily. [provider] Taking Active Self  Blood Glucose Monitoring Suppl (TRUE METRIX METER) w/Device KIT [id number] No 1 each by Other route in the morning and at bedtime. [provider] Taking Active Self  buPROPion (WELLBUTRIN XL) 150 MG 24 hr tablet [id number] No Take 150 mg by mouth daily. [provider] Taking Active   Calcium Carb-Cholecalciferol (CALCIUM + D3) 600-200 MG-UNIT TABS [id number] No Take 2 tablets by mouth daily.  [provider] Taking Active Self  cholecalciferol (VITAMIN D3) 25 MCG (1000 UT) tablet [id number] No Take 1,000 Units by mouth daily. [provider] Taking Active Self  furosemide (LASIX) 40 MG tablet [id number] No Take 1 tablet (40 mg total) by mouth daily. [name], MD Taking Active   glucose blood test strip [id number] No 1 each by Other route as needed for other (blood sugar). [provider] Taking Active Self  insulin aspart (NOVOLOG) 100 UNIT/ML FlexPen [id number] No  Insulin sliding scale: Blood sugar  120-150   3units                       151-200   4units                       201-250   7units                       251- 300  11units                       301-350   15uints                       351-400   20units                       >400         call MD immediately [name], MD Taking Active Self  insulin glargine (LANTUS) 100 UNIT/ML injection [id number] No Inject 10 Units into the skin daily. [provider] Taking Active   insulin glargine (LANTUS) 100 UNIT/ML Solostar Pen [id number] No Inject 10 Units into the skin daily for 10 days.  Patient taking differently: Inject 10 Units into the skin at bedtime.   [name], MD 07/12/2021 Expired 07/14/21 2359 Self  Insulin Pen Needle (PEN NEEDLES 29GX1/2") 29G X 12MM MISC [id number] No For insulin injection [name], MD Taking Active Self  levothyroxine (SYNTHROID, LEVOTHROID) 75  MCG tablet [id number] No Take 75 mcg by mouth daily before breakfast.  [provider] Taking Active Self  lovastatin (MEVACOR) 40 MG tablet [id number] No Take 40 mg by mouth at bedtime. [provider] Taking Active Self  metoprolol succinate (TOPROL-XL) 25 MG 24 hr tablet [id number] No Take 0.5 tablets (12.5 mg total) by mouth daily. [name], MD Taking Active Self  nicotine (NICODERM CQ - DOSED IN MG/24 HOURS) 14 mg/24hr patch [id number] No Place 21 mg onto the skin daily. [provider] Taking Active Self           Med Note [name], [name]   Fri Jul 01, 2021 11:29 AM)    Omega-3 Fatty Acids (FISH OIL PO) [id number] No Take 1,000 mg by mouth daily. [provider] Taking Active Self  OXYGEN [id number] No Inhale 3 L into the lungs continuous. [provider] Taking Active Self  potassium chloride (KLOR-CON) 10 MEQ tablet [id number] No TAKE 1 TABLET EVERY DAY [name], MD Taking Active   predniSONE (DELTASONE) 10 MG tablet [id number] No Take 2 tab 20 mg daily for 7  days, then take 10 mg daily. [name], [name], MD Taking Active            Med Note [name], [name]   Thu Jul 14, 2021 12:24 AM) Pt to start 10 mg qd x 10 days 07/14/21  senna-docusate (SENOKOT-S) 8.6-50 MG tablet [id number] No Take 1 tablet by mouth at bedtime as needed for mild constipation.  Patient not taking: Reported on 08/09/2021   [name], MD Not Taking Active Self  SMART SENSE THIN LANCETS 26G [place] [id number] No 1 each by Does not apply route 2 (two) times daily. [provider] Taking Active Self  sodium chloride flush (NS) 0.9 % injection 3 mL [id number]   [name], MD  Active   Tiotropium Bromide-Olodaterol (STIOLTO RESPIMAT) 2.5-2.5 MCG/ACT AERS [id number]  Inhale 2 puffs into the lungs daily. Brand Males, MD  Active              Goals Addressed               This Visit's Progress     [facility]) Obtain Eye Exam-Diabetes Type 2 (pt-stated)         Timeframe:  Short-Term Goal Priority:  High Start Date:  08/16/21                           Expected End Date: Nov2022                       Follow Up Date: Nov 2022   - keep appointment with eye doctor - schedule appointment with eye doctor    Why is this important?   Eye check-ups are important when you have diabetes.  Vision loss can be prevented.    Notes:   08/16/21-Patient reports she went for her annual eye exam on last week. Since then she has been having blurry vision and dizziness after MD placed eye drops in eyes. She will call office today to follow up and see what she needs to do. She was given Rx for eye gtts which she did not get filled yet.   09/12/21-Patient reports she did follow up wit eye MD. She was given new Rx for glasses which has helped and relieved sxs. She is now scheduled for left eye cataracts surgery on 10/06/21  COMPLETED: (THN)Make and Keep All Appointments (pt-stated)        Timeframe:  Short-Term Goal Priority:  High Start Date:   07/18/21                           Expected End Date:   Sept2022                    Follow Up Date Oct 2022  Barriers: Health Behaviors Knowledge    - arrange a ride through an agency 1 week before appointment - ask family or friend for a ride - call to cancel if needed - keep a calendar with appointment dates    Why is this important?   Part of staying healthy is seeing the doctor for follow-up care.  If you forget your appointments, there are some things you can do to stay on track.    Notes:   07/18/21-Patient awaiting call back from PCP office for MD follow up appt.   08/09/21-Patient currently headed to lung MD ppt. States PCP appt is tomorrow.  09/12/21-Patient reports she has followed up with lung,PCP and eye MD. She goes back to see lung MD on 09/20/21 and PCP in Nov.       (THN)Set My Target A1C-Diabetes Type 2 (pt-stated)        Timeframe:  Long-Range Goal Priority:  High Start Date: 07/18/21                            Expected End Date:  Dec 2022                     Follow Up Date Oct 2022   Barriers: Health Behaviors    - set target A1C-maintain A1C level of 7.0    Why is this important?   Your target A1C is decided together by you and your doctor.  It is based on several things like your age and other health issues.    Notes:   07/18/21-Patient reports checking cbgs about 2x/day. Blood sugars ranging in the 90s to mid 100s. Last 1C on file 7.0(June 2022).  08/09/2021-Did not get to address this call.  910/3/22-Patient has had no recent labwork. She reports cbgs have bene in the mid to low 100s.      (THN)Track and Manage Fluids and Swelling-Heart Failure (pt-stated)        Timeframe:  Short-Term Goal Priority:  High Start Date:  07/18/2021                           Expected End Date:  Nov 2022                     Follow Up Date Nov2022  Barriers: Health Behaviors Knowledge    - call office if I gain more than 2 pounds in one day or 5 pounds in one week - keep legs up while sitting -  track weight in diary - use salt in moderation - watch for swelling in feet, ankles and legs every day - weigh myself daily    Why is this important?   It is important to check your weight daily and watch how much salt and liquids you have.  It will help you to manage your heart failure.    Notes:   07/18/21 Patient reports weighing 2x/day  daily. Wgt this am was 164 lbs. She voices adherence to diuretic regimen. 08/09/21-Patient reports weigh down to about 159lbs. Denies any edema/SOB or HF sxs.  09/12/21-Patient admits that she has missed weighing a few times lately due to busy schedule. She reports wgt was stable at last MD appt. She denies any edema,SOB or other sxs.        (THN)Track and Manage My Blood Pressure-Hypertension (pt-stated)        Timeframe:  Short-Term Goal Priority:  High Start Date:  09/12/21                           Expected End Date: Nov 2022                      Follow Up Date Nov 2022  Barriers: None    - check blood pressure daily - choose a place to take my blood pressure (home, clinic or office, retail store) - write blood pressure results in a log or diary    Why is this important?   You won't feel high blood pressure, but it can still hurt your blood vessels.  High blood pressure can cause heart or kidney problems. It can also cause a stroke.  Making lifestyle changes like losing a little weight or eating less salt will help.  Checking your blood pressure at home and at different times of the day can help to control blood pressure.  If the doctor prescribes medicine remember to take it the way the doctor ordered.  Call the office if you cannot afford the medicine or if there are questions about it.     Notes:  09/12/21-Patient reported BP this AM was 137/65. She reports she is consistently trying to remember to check BP at least 1x/day.       COMPLETED: (THN)Track and Manage Symptoms-Heart Failure (pt-stated)        Timeframe:  Short-Term  Goal Priority:  High Start Date:  07/18/2021                           Expected End Date:  Oct 2022                      Follow Up Date: Sept 2022   Barriers: Health Behaviors Knowledge    - begin a heart failure diary - develop a rescue plan - follow rescue plan if symptoms flare-up - know when to call the doctor - track symptoms and what helps feel better or worse    Why is this important?   You will be able to handle your symptoms better if you keep track of them.  Making some simple changes to your lifestyle will help.  Eating healthy is one thing you can do to take good care of yourself.    Notes:   07/18/21-Patient reports cardiac status managed at present. Denies any fluid retention or SOB. She is using oxygen 24/7 at 4L/min.   08/09/21-Patient continues to use oxygen at 4L/min. Denies any edema,SOB, or other HF sxs. She has MD follow up appt tomorrow.  09/12/21-Patient was happy to report that she has been able to go short periods without oxygen. She is currently sitting outside on porch without wearing oxygen but has it nearby if needed.           Plan: RN CM discussed with patient next outreach within  4-5wks. Patient agrees to care plan and follow up. Patient gave verbal consent and in agreement with RN CM follow up and timeframe. Patient aware that they may contact RN CM sooner for any issues or concerns. RN CM reviewed goals and plan of care with patient.   Enzo Montgomery, RN,BSN,CCM [facility] Telephonic Care Management Coordinator Direct Phone: [phone] Toll Free: [phone] Fax: [phone]

## 2021-09-20 ENCOUNTER — Other Ambulatory Visit: Payer: Self-pay

## 2021-09-20 ENCOUNTER — Ambulatory Visit (INDEPENDENT_AMBULATORY_CARE_PROVIDER_SITE_OTHER): Payer: Medicare HMO | Admitting: Cardiology

## 2021-09-20 VITALS — BP 127/58 | HR 75 | Ht 66.0 in | Wt 168.8 lb

## 2021-09-20 DIAGNOSIS — J9611 Chronic respiratory failure with hypoxia: Secondary | ICD-10-CM | POA: Diagnosis not present

## 2021-09-20 DIAGNOSIS — I5042 Chronic combined systolic (congestive) and diastolic (congestive) heart failure: Secondary | ICD-10-CM | POA: Diagnosis not present

## 2021-09-20 DIAGNOSIS — I480 Paroxysmal atrial fibrillation: Secondary | ICD-10-CM

## 2021-09-20 DIAGNOSIS — I272 Pulmonary hypertension, unspecified: Secondary | ICD-10-CM | POA: Diagnosis not present

## 2021-09-20 DIAGNOSIS — R300 Dysuria: Secondary | ICD-10-CM | POA: Diagnosis not present

## 2021-09-20 NOTE — Patient Instructions (Signed)
Medication Instructions:  Your physician recommends that you continue on your current medications as directed. Please refer to the Current Medication list given to you today.  *If you need a refill on your cardiac medications before your next appointment, please call your pharmacy*   Lab Work: BMET, Mag, BNP, UA today  If you have labs (blood work) drawn today and your tests are completely normal, you will receive your results only by: [place] (if you have MyChart) OR A paper copy in the mail If you have any lab test that is abnormal or we need to change your treatment, we will call you to review the results.   Testing/Procedures: Your physician has requested that you have an echocardiogram. Echocardiography is a painless test that uses sound waves to create images of your heart. It provides your doctor with information about the size and shape of your heart and how well your heart's chambers and valves are working. This procedure takes approximately one hour. There are no restrictions for this procedure.  This will be done at our [facility] location:  [place]: At [employer], you and your health needs are our priority.  As part of our continuing mission to provide you with exceptional heart care, we have created designated Provider Care Teams.  These Care Teams include your primary Cardiologist (physician) and Advanced Practice Providers (APPs -  Physician Assistants and Nurse Practitioners) who all work together to provide you with the care you need, when you need it.  We recommend signing up for the patient portal called "MyChart".  Sign up information is provided on this After Visit Summary.  MyChart is used to connect with patients for Virtual Visits (Telemedicine).  Patients are able to view lab/test results, encounter notes, upcoming appointments, etc.  Non-urgent messages can be sent to your provider as well.   To learn more about what  you can do with MyChart, go to [email or web address].    Your next appointment:   3 month(s)  The format for your next appointment:   In Person  Provider:   Oswaldo Milian, MD   Other Instructions You have been referred to: [facility]

## 2021-09-20 NOTE — Progress Notes (Signed)
Cardiology Office Note:    Date:  09/20/2021   ID:  Elizabeth SOBECKI, DOB 06-09-1941, MRN [id number]  PCP:  Kathyrn Lass, MD  Cardiologist:  [name], MD  Electrophysiologist:  None   Referring MD: Kathyrn Lass, MD   Chief Complaint  Patient presents with   Congestive Heart Failure     History of Present Illness:    Elizabeth Melton is a 80 y.o. female with a hx of submassive PE, interstitial lung disease, diabetes, hyperlipidemia, HFpEF who presents for follow-up.  She was admitted on 01/16/2021 with COVID-19 pneumonia.  Course complicated by development of A. fib with RVR.  She was started on amiodarone drip and converted to sinus rhythm.  Amiodarone was discontinued given elevated liver enzymes.  She was discharged on Eliquis and Toprol-XL 25 mg daily.  She was noted while on metoprolol and amiodarone to have bradycardia.  Echocardiogram on 01/22/2021 showed LVEF 40 to 45%, severe biatrial dilatation, mildly reduced RV function, moderate MR, mild to moderate TR, moderate AI.  She was discharged on Toprol-XL 25 mg daily and losartan 12.5 mg daily.  At follow-up clinic visit May 2022, noted SPO2 initially less than 70%.  She reports she went shopping tand was without her oxygen.  She appeared dyspneic and was strongly recommended that she go to the ED but she declined.  She was started on oxygen and SPO2 gradually improved to high 80s/low 90s.  Her dyspnea improved.  States that she has quit smoking.  She was hospitalized from 6/8 through 05/22/2021 at [facility] for acute on chronic hypoxic respiratory failure.  She improved with IV diuresis as well as steroids.  Repeat echocardiogram 05/30/2021 showed EF 60 to 71%, grade 2 diastolic dysfunction, RVSP 63, moderate MR, moderate TR. Right heart catheterization showed RA 8, RV 64/0, PA 62/28/37, PCWP 28, CI 1.7.  Mean PA pressure was 47 mmHg on room air, improved to 36 mmHg with 3 L [place].   Since last clinic visit, she was admitted  07/15/2021 with volume overload, improved with IV Lasix.  Was discharged on Lasix 40 mg twice daily.  Reports her dyspnea has improved.  She decreased her dose to 40 mg every morning/20 mg nightly.  She denies any chest pain, lightheadedness, syncope, lower extremity edema.  Does report has been having dysuria recently.  Wt Readings from Last 3 Encounters:  09/20/21 168 lb 12.8 oz (76.6 kg)  08/09/21 161 lb (73 kg)  07/15/21 158 lb 12.8 oz (72 kg)     Past Medical History:  Diagnosis Date   Acute on chronic diastolic (congestive) heart failure ([place]) 05/19/2021   Diabetes mellitus without complication (HCC)    Hyperlipidemia    Hypertension    Hypothyroidism    IPF (idiopathic pulmonary fibrosis) ([place])    Thrombocytopenia (HCC)     Past Surgical History:  Procedure Laterality Date   RIGHT HEART CATH N/A 07/05/2021   Procedure: RIGHT HEART CATH;  Surgeon: [name], MD;  Location: [place] CV LAB;  Service: Cardiovascular;  Laterality: N/A;   TUBAL LIGATION      Current Medications: Current Meds  Medication Sig   acetaminophen (TYLENOL) 500 MG tablet Take 500-1,000 mg by mouth every 8 (eight) hours as needed for headache or mild pain (pain).   apixaban (ELIQUIS) 5 MG TABS tablet Take 1 tablet (5 mg total) by mouth 2 (two) times daily.   b complex vitamins tablet Take 1 tablet by mouth daily.   Blood  Glucose Monitoring Suppl (TRUE METRIX METER) w/Device KIT 1 each by Other route in the morning and at bedtime.   buPROPion (WELLBUTRIN XL) 150 MG 24 hr tablet Take 150 mg by mouth daily.   Calcium Carb-Cholecalciferol (CALCIUM + D3) 600-200 MG-UNIT TABS Take 2 tablets by mouth daily.    cholecalciferol (VITAMIN D3) 25 MCG (1000 UT) tablet Take 1,000 Units by mouth daily.   furosemide (LASIX) 40 MG tablet Take 1 tablet (40 mg total) by mouth daily.   glucose blood test strip 1 each by Other route as needed for other (blood sugar).   insulin aspart (NOVOLOG) 100 UNIT/ML FlexPen  Insulin sliding scale: Blood sugar  120-150   3units                       151-200   4units                       201-250   7units                       251- 300  11units                       301-350   15uints                       351-400   20units                       >400         call MD immediately   Insulin Pen Needle (PEN NEEDLES 29GX1/2") 29G X 12MM MISC For insulin injection   levothyroxine (SYNTHROID, LEVOTHROID) 75 MCG tablet Take 75 mcg by mouth daily before breakfast.    lovastatin (MEVACOR) 40 MG tablet Take 40 mg by mouth at bedtime.   metoprolol succinate (TOPROL-XL) 25 MG 24 hr tablet Take 0.5 tablets (12.5 mg total) by mouth daily.   nicotine (NICODERM CQ - DOSED IN MG/24 HOURS) 14 mg/24hr patch Place 21 mg onto the skin daily.   Omega-3 Fatty Acids (FISH OIL PO) Take 1,000 mg by mouth daily.   OXYGEN Inhale 3 L into the lungs continuous.   potassium chloride (KLOR-CON) 10 MEQ tablet TAKE 1 TABLET EVERY DAY   SMART SENSE THIN LANCETS 26G MISC 1 each by Does not apply route 2 (two) times daily.   Current Facility-Administered Medications for the 09/20/21 encounter (Office Visit) with [name], MD  Medication   sodium chloride flush (NS) 0.9 % injection 3 mL     Allergies:   Losartan   Social History   Socioeconomic History   Marital status: Widowed    Spouse name: Not on file   Number of children: Not on file   Years of education: Not on file   Highest education level: Not on file  Occupational History   Not on file  Tobacco Use   Smoking status: Former    Packs/day: 1.00    Years: 60.00    Pack years: 60.00    Types: Cigarettes    Start date: 12/11/1958   Smokeless tobacco: Former    Quit date: 09/24/1962   Tobacco comments:    Smokes occ about 3 cigs per day as of 08/09/21  Vaping Use   Vaping Use: Never used  Substance and Sexual Activity   Alcohol use: Yes   Drug  use: No   Sexual activity: Not on file  Other Topics Concern   Not  on file  Social History Narrative   Not on file   Social Determinants of Health   Financial Resource Strain: Not on file  Food Insecurity: No Food Insecurity   Worried About Running Out of Food in the Last Year: Never true   Ran Out of Food in the Last Year: Never true  Transportation Needs: No Transportation Needs   Lack of Transportation (Medical): No   Lack of Transportation (Non-Medical): No  Physical Activity: Not on file  Stress: Not on file  Social Connections: Not on file     Family History: The patient's family history includes Diabetes in her mother; Hyperlipidemia in her sister; Hypertension in her mother and sister; Kidney disease in her mother; Other in her father.  ROS:   Please see the history of present illness.     All other systems reviewed and are negative.  EKGs/Labs/Other Studies Reviewed:    The following studies were reviewed today:  Echo 6/22:   IMPRESSIONS:   1. Left ventricular ejection fraction, by estimation, is 60 to 65%. The  left ventricle has normal function. The left ventricle has no regional  wall motion abnormalities. Left ventricular diastolic parameters are  consistent with Grade II diastolic  dysfunction (pseudonormalization). Elevated left ventricular end-diastolic  pressure.   2. Right ventricular systolic function is low normal. The right  ventricular size is mildly enlarged. There is severely elevated pulmonary  artery systolic pressure. The estimated right ventricular systolic  pressure is 82.5 mmHg.   3. Left atrial size was moderately dilated.   4. Right atrial size was mild to moderately dilated.   5. The mitral valve is degenerative. Moderate mitral valve regurgitation.  No evidence of mitral stenosis. Moderate mitral annular calcification.   6. Tricuspid valve regurgitation is moderate.   7. The aortic valve is calcified. There is moderate calcification of the  aortic valve. There is moderate thickening of the aortic  valve. Aortic  valve regurgitation is mild to moderate. Mild aortic valve stenosis.   8. Aortic dilatation noted. There is borderline dilatation of the  ascending aorta, measuring 38 mm.   Comparison(s): No significant change from prior study.  Echo 02/22  IMPRESSIONS    1. Left ventricular ejection fraction, by estimation, is 40 to 45%. The  left ventricle has mildly decreased function. The left ventricle  demonstrates global hypokinesis. There is mild concentric left ventricular  hypertrophy. Diastolic function is  indeterminant due to Afib.   2. Right ventricular systolic function is mildly reduced. The right  ventricular size is normal. There is mildly elevated pulmonary artery  systolic pressure.   3. Left atrial size was severely dilated.   4. Right atrial size was severely dilated.   5. The mitral valve is abnormal. Moderate mitral valve regurgitation.  Moderate mitral annular calcification.   6. Tricuspid valve regurgitation is mild to moderate.   7. The aortic valve is tricuspid. There is moderate calcification of the  aortic valve. There is moderate thickening of the aortic valve. Aortic  valve regurgitation is moderate. Mild aortic valve stenosis.   8. The inferior vena cava is dilated in size with <50% respiratory  variability, suggesting right atrial pressure of 15 mmHg.   Comparison(s): Compared to prior TTE, the LVEF appears depressed to  40-45%, there is now moderate MR and AI. Patient is also in Afib currently  with rapid ventricular response.  Echo 01/22  IMPRESSIONS:   1. Left ventricular ejection fraction, by estimation, is 60 to 65%. The  left ventricle has normal function. The left ventricle has no regional  wall motion abnormalities. There is mild left ventricular hypertrophy.  Left ventricular diastolic parameters  are consistent with Grade II diastolic dysfunction (pseudonormalization).  Elevated left ventricular end-diastolic pressure. The E/e' is  24.   2. Right ventricular systolic function is normal. The right ventricular  size is normal. There is severely elevated pulmonary artery systolic  pressure. The estimated right ventricular systolic pressure is 43.1 mmHg.   3. Left atrial size was severely dilated.   4. Right atrial size was mildly dilated.   5. The mitral valve is grossly normal. Trivial mitral valve  regurgitation.   6. The aortic valve is tricuspid. Aortic valve regurgitation is mild.  Mild aortic valve stenosis. Aortic valve area, by VTI measures 1.71 cm.  Aortic valve mean gradient measures 8.0 mmHg. Aortic valve Vmax measures  2.17 m/s.   7. The inferior vena cava is normal in size with greater than 50%  respiratory variability, suggesting right atrial pressure of 3 mmHg.   Comparison(s): Changes from prior study are noted. 09/06/20: LVEF 60-65%,  RVSP 70 mmHG, severely dilated and hypokinetic RV. In my review of the  previous study, the RV does not appear significantly dilated or  hypokinetic.   EKG:   10/22:NSR, rate 75, nonspecific T wave flattening 07/22: no EKG was ordered today 05/22:normal sinus rhythm, rate 82, nonspecific T wave flattening  Recent Labs: 05/19/2021: TSH 2.028 07/13/2021: ALT 17 07/14/2021: Hemoglobin 14.1; Magnesium 2.1; Platelets 178 07/15/2021: B Natriuretic Peptide 387.8; BUN 21; Creatinine, Ser 0.87; Potassium 4.1; Sodium 139  Recent Lipid Panel    Component Value Date/Time   CHOL 105 01/21/2021 0242   TRIG 77 01/21/2021 0242   HDL 28 (L) 01/21/2021 0242   CHOLHDL 3.8 01/21/2021 0242   VLDL 15 01/21/2021 0242   LDLCALC 62 01/21/2021 0242    Physical Exam:    VS:  BP (!) 127/58 (BP Location: Left Arm, Patient Position: Sitting, Cuff Size: Normal)   Pulse 75   Ht _0  (1.676 m)   Wt 168 lb 12.8 oz (76.6 kg)   BMI 27.25 kg/m     Wt Readings from Last 3 Encounters:  09/20/21 168 lb 12.8 oz (76.6 kg)  08/09/21 161 lb (73 kg)  07/15/21 158 lb 12.8 oz (72 kg)     GEN: in  no acute distress HEENT: Normal NECK: + JVD CARDIAC: RRR, no murmurs, rubs, gallops RESPIRATORY: Basilar crackles ABDOMEN: Soft, non-tender, non-distended MUSCULOSKELETAL:  No edema; No deformity  SKIN: Warm and dry NEUROLOGIC:  Alert and oriented x 3 PSYCHIATRIC:  Normal affect   ASSESSMENT:    1. Pulmonary hypertension ([place])   2. Chronic combined systolic (congestive) and diastolic (congestive) heart failure ([place])   3. PAF (paroxysmal atrial fibrillation) ([place])   4. Dysuria   5. Chronic respiratory failure with hypoxia (HCC)      PLAN:     Chronic combined systolic and diastolic heart failure: Echocardiogram on 01/22/2021 showed LVEF 40 to 45%, severe biatrial dilatation, mildly reduced RV function, moderate MR, mild to moderate TR, moderate AI.  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  Repeat echocardiogram 05/30/2021 showed EF 60 to 54%, grade 2 diastolic dysfunction, RVSP 63, moderate MR, moderate TR.  Right heart catheterization showed RA 8, RV 64/0, PA 62/28/37, PCWP 28, CI 1.7.  Mean  PA pressure was 47 mmHg on room air, improved to 36 mmHg with 3 L [place]. -She was initially started on amiodarone during hospitalization but was discontinued due to transaminitis.  Amiodarone is likely not a good long-term choice for her given her pulmonary issues as above.  She does appear to be maintaining sinus rhythm.  Continue Toprol-XL 12.5 mg daily -Continue Lasix 40 mg daily/1m QHS.  Appears mildly hypervolemic, will check BMP/magnesium/BNP and likely need to increase lasix back to 40 mg BID -RHC is concerning, with severe pulmonary hypertension and low cardiac output.  Concerned she is developing right heart failure.  We will repeat echocardiogram and refer to Advanced Heart Failure  Pulmonary hypertension: Suspect class III in setting of interstitial lung disease as well as class II from chronic combined systolic/diastolic heart failure.  VQ scan very low  probability PE 08/17/2021.  Right heart catheterization showed RA 8, RV 64/0, PA 62/28/37, PCWP 28, CI 1.7.  Mean PA pressure was 47 mmHg on room air, improved to 36 mmHg with 3 L [place]. -Follows with Dr RChase Caller considering antifibrotics -Recommend referral to Advanced Heart Failure given low CI/severe PH on RHC  Chronic respiratory failure with hypoxia: Hypoxia is likely multifactorial in setting of interstitial lung disease and chronic combined systolic and diastolic heart failure.  -Follow with Dr RChase Callerin pulmonology  Paroxysmal atrial fibrillation: Diagnosed during admission with COVID-19 pneumonia in February 2022.  Converted to sinus rhythm with amiodarone, which was discontinued due to elevated liver enzymes -Appears to be maintaining sinus rhythm -Continue Toprol-XL 12.5 mg daily -Continue Eliquis 5 mg twice daily  Dysuria: will check UA with reflex to culture  RTC in 3 months  Medication Adjustments/Labs and Tests Ordered: Current medicines are reviewed at length with the patient today.  Concerns regarding medicines are outlined above.  Orders Placed This Encounter  Procedures   UA/M w/rflx Culture, Routine   Basic metabolic panel   Magnesium   Brain natriuretic peptide   AMB referral to CHF clinic   EKG 12-Lead   ECHOCARDIOGRAM COMPLETE     No orders of the defined types were placed in this encounter.   Patient Instructions  Medication Instructions:  Your physician recommends that you continue on your current medications as directed. Please refer to the Current Medication list given to you today.  *If you need a refill on your cardiac medications before your next appointment, please call your pharmacy*   Lab Work: BMET, Mag, BNP, UA today  If you have labs (blood work) drawn today and your tests are completely normal, you will receive your results only by: [place](if you have MyChart) OR A paper copy in the mail If you have any lab test that is  abnormal or we need to change your treatment, we will call you to review the results.   Testing/Procedures: Your physician has requested that you have an echocardiogram. Echocardiography is a painless test that uses sound waves to create images of your heart. It provides your doctor with information about the size and shape of your heart and how well your heart's chambers and valves are working. This procedure takes approximately one hour. There are no restrictions for this procedure.  This will be done at [facility]  [facility] [facility] you and your health needs are our priority.  As part of our continuing mission to provide you with exceptional heart care, we have created designated Provider Care  Teams.  These Care Teams include your primary Cardiologist (physician) and Advanced Practice Providers (APPs -  Physician Assistants and Nurse Practitioners) who all work together to provide you with the care you need, when you need it.  We recommend signing up for the patient portal called "MyChart".  Sign up information is provided on this After Visit Summary.  MyChart is used to connect with patients for Virtual Visits (Telemedicine).  Patients are able to view lab/test results, encounter notes, upcoming appointments, etc.  Non-urgent messages can be sent to your provider as well.   To learn more about what you can do with MyChart, go to [email or web address].    Your next appointment:   3 month(s)  The format for your next appointment:   In Person  Provider:   Oswaldo Milian, MD   Other Instructions You have been referred to: Advanced Heart Failure Clinic      Signed, [name], MD  09/20/2021 6:28 PM    [place]

## 2021-09-21 LAB — UA/M W/RFLX CULTURE, ROUTINE
Bilirubin, UA: NEGATIVE
Glucose, UA: NEGATIVE
Ketones, UA: NEGATIVE
Leukocytes,UA: NEGATIVE
Nitrite, UA: NEGATIVE
Protein,UA: NEGATIVE
RBC, UA: NEGATIVE
Specific Gravity, UA: 1.013 (ref 1.005–1.030)
Urobilinogen, Ur: 1 mg/dL (ref 0.2–1.0)
pH, UA: 7 (ref 5.0–7.5)

## 2021-09-21 LAB — MICROSCOPIC EXAMINATION
Bacteria, UA: NONE SEEN
Casts: NONE SEEN /lpf
RBC, Urine: NONE SEEN /hpf (ref 0–2)
WBC, UA: NONE SEEN /hpf (ref 0–5)

## 2021-09-21 LAB — BASIC METABOLIC PANEL
BUN/Creatinine Ratio: 25 (ref 12–28)
BUN: 29 mg/dL — ABNORMAL HIGH (ref 8–27)
CO2: 23 mmol/L (ref 20–29)
Calcium: 10.2 mg/dL (ref 8.7–10.3)
Chloride: 100 mmol/L (ref 96–106)
Creatinine, Ser: 1.17 mg/dL — ABNORMAL HIGH (ref 0.57–1.00)
Glucose: 128 mg/dL — ABNORMAL HIGH (ref 70–99)
Potassium: 4.1 mmol/L (ref 3.5–5.2)
Sodium: 143 mmol/L (ref 134–144)
eGFR: 47 mL/min/{1.73_m2} — ABNORMAL LOW (ref >59)

## 2021-09-21 LAB — BRAIN NATRIURETIC PEPTIDE: BNP: 233.1 pg/mL — ABNORMAL HIGH (ref 0.0–100.0)

## 2021-09-21 LAB — MAGNESIUM: Magnesium: 2.5 mg/dL — ABNORMAL HIGH (ref 1.6–2.3)

## 2021-09-25 DIAGNOSIS — I4891 Unspecified atrial fibrillation: Secondary | ICD-10-CM | POA: Diagnosis not present

## 2021-09-25 DIAGNOSIS — J849 Interstitial pulmonary disease, unspecified: Secondary | ICD-10-CM | POA: Diagnosis not present

## 2021-10-05 ENCOUNTER — Ambulatory Visit (HOSPITAL_COMMUNITY): Payer: Medicare HMO | Attending: Cardiology

## 2021-10-05 ENCOUNTER — Other Ambulatory Visit: Payer: Self-pay

## 2021-10-05 DIAGNOSIS — I5042 Chronic combined systolic (congestive) and diastolic (congestive) heart failure: Secondary | ICD-10-CM | POA: Insufficient documentation

## 2021-10-05 DIAGNOSIS — I272 Pulmonary hypertension, unspecified: Secondary | ICD-10-CM | POA: Diagnosis not present

## 2021-10-05 LAB — ECHOCARDIOGRAM COMPLETE
AR max vel: 1.14 cm2
AV Area VTI: 1.23 cm2
AV Area mean vel: 1.15 cm2
AV Mean grad: 9.3 mmHg
AV Peak grad: 18.4 mmHg
Ao pk vel: 2.14 m/s
Area-P 1/2: 3.99 cm2
MV M vel: 5.22 m/s
MV Peak grad: 109 mmHg
P 1/2 time: 281 msec
Radius: 0.6 cm
S' Lateral: 2.4 cm

## 2021-10-06 DIAGNOSIS — H18413 Arcus senilis, bilateral: Secondary | ICD-10-CM | POA: Diagnosis not present

## 2021-10-06 DIAGNOSIS — H2513 Age-related nuclear cataract, bilateral: Secondary | ICD-10-CM | POA: Diagnosis not present

## 2021-10-06 DIAGNOSIS — H25043 Posterior subcapsular polar age-related cataract, bilateral: Secondary | ICD-10-CM | POA: Diagnosis not present

## 2021-10-06 DIAGNOSIS — H25013 Cortical age-related cataract, bilateral: Secondary | ICD-10-CM | POA: Diagnosis not present

## 2021-10-06 DIAGNOSIS — E119 Type 2 diabetes mellitus without complications: Secondary | ICD-10-CM | POA: Diagnosis not present

## 2021-10-14 ENCOUNTER — Other Ambulatory Visit: Payer: Self-pay | Admitting: Internal Medicine

## 2021-10-14 LAB — SARS CORONAVIRUS 2 (TAT 6-24 HRS): SARS Coronavirus 2: NEGATIVE

## 2021-10-17 ENCOUNTER — Ambulatory Visit: Payer: Medicare HMO | Admitting: Internal Medicine

## 2021-10-18 ENCOUNTER — Other Ambulatory Visit: Payer: Self-pay

## 2021-10-18 NOTE — Patient Outreach (Signed)
[facility]) Care Management  10/18/2021  Elizabeth Melton 09/12/1941 [id number]   Telephone Assessment   Unsuccessful outreach attempt to patient. No answer. RN CM left HIPAA compliant voicemail message along with contact info.    Plan: RN CM will make outreach attempt to patient within the month of Dec if no return call.    Enzo Montgomery, RN,BSN,CCM [place] [facility] Telephonic Care Management Coordinator Direct Phone: [phone] Toll Free: [phone] Fax: [phone]

## 2021-10-25 DIAGNOSIS — Z86711 Personal history of pulmonary embolism: Secondary | ICD-10-CM | POA: Diagnosis not present

## 2021-10-25 DIAGNOSIS — I480 Paroxysmal atrial fibrillation: Secondary | ICD-10-CM | POA: Diagnosis not present

## 2021-10-25 DIAGNOSIS — R300 Dysuria: Secondary | ICD-10-CM | POA: Diagnosis not present

## 2021-10-25 DIAGNOSIS — J9611 Chronic respiratory failure with hypoxia: Secondary | ICD-10-CM | POA: Diagnosis not present

## 2021-10-25 DIAGNOSIS — F17210 Nicotine dependence, cigarettes, uncomplicated: Secondary | ICD-10-CM | POA: Diagnosis not present

## 2021-10-25 DIAGNOSIS — E1169 Type 2 diabetes mellitus with other specified complication: Secondary | ICD-10-CM | POA: Diagnosis not present

## 2021-10-25 DIAGNOSIS — J449 Chronic obstructive pulmonary disease, unspecified: Secondary | ICD-10-CM | POA: Diagnosis not present

## 2021-10-25 DIAGNOSIS — Z9981 Dependence on supplemental oxygen: Secondary | ICD-10-CM | POA: Diagnosis not present

## 2021-10-25 DIAGNOSIS — E039 Hypothyroidism, unspecified: Secondary | ICD-10-CM | POA: Diagnosis not present

## 2021-11-01 DIAGNOSIS — J449 Chronic obstructive pulmonary disease, unspecified: Secondary | ICD-10-CM | POA: Diagnosis not present

## 2021-11-01 DIAGNOSIS — E785 Hyperlipidemia, unspecified: Secondary | ICD-10-CM | POA: Diagnosis not present

## 2021-11-01 DIAGNOSIS — M810 Age-related osteoporosis without current pathological fracture: Secondary | ICD-10-CM | POA: Diagnosis not present

## 2021-11-01 DIAGNOSIS — E1169 Type 2 diabetes mellitus with other specified complication: Secondary | ICD-10-CM | POA: Diagnosis not present

## 2021-11-01 DIAGNOSIS — E1165 Type 2 diabetes mellitus with hyperglycemia: Secondary | ICD-10-CM | POA: Diagnosis not present

## 2021-11-01 DIAGNOSIS — I10 Essential (primary) hypertension: Secondary | ICD-10-CM | POA: Diagnosis not present

## 2021-11-01 DIAGNOSIS — I480 Paroxysmal atrial fibrillation: Secondary | ICD-10-CM | POA: Diagnosis not present

## 2021-11-01 DIAGNOSIS — E039 Hypothyroidism, unspecified: Secondary | ICD-10-CM | POA: Diagnosis not present

## 2021-11-02 ENCOUNTER — Encounter (HOSPITAL_COMMUNITY): Payer: Self-pay

## 2021-11-02 ENCOUNTER — Inpatient Hospital Stay (HOSPITAL_COMMUNITY)
Admission: EM | Admit: 2021-11-02 | Discharge: 2021-11-07 | DRG: 291 | Disposition: A | Discharge status: Home Health | Payer: Medicare HMO | Attending: Family Medicine | Admitting: Family Medicine

## 2021-11-02 DIAGNOSIS — Z720 Tobacco use: Secondary | ICD-10-CM | POA: Diagnosis not present

## 2021-11-02 DIAGNOSIS — J84112 Idiopathic pulmonary fibrosis: Secondary | ICD-10-CM | POA: Diagnosis present

## 2021-11-02 DIAGNOSIS — I248 Other forms of acute ischemic heart disease: Secondary | ICD-10-CM | POA: Diagnosis present

## 2021-11-02 DIAGNOSIS — Z9981 Dependence on supplemental oxygen: Secondary | ICD-10-CM

## 2021-11-02 DIAGNOSIS — I700 Atherosclerosis of aorta: Secondary | ICD-10-CM | POA: Diagnosis present

## 2021-11-02 DIAGNOSIS — K760 Fatty (change of) liver, not elsewhere classified: Secondary | ICD-10-CM | POA: Diagnosis present

## 2021-11-02 DIAGNOSIS — I480 Paroxysmal atrial fibrillation: Secondary | ICD-10-CM | POA: Diagnosis not present

## 2021-11-02 DIAGNOSIS — I5032 Chronic diastolic (congestive) heart failure: Secondary | ICD-10-CM | POA: Diagnosis present

## 2021-11-02 DIAGNOSIS — R079 Chest pain, unspecified: Secondary | ICD-10-CM

## 2021-11-02 DIAGNOSIS — Z20822 Contact with and (suspected) exposure to covid-19: Secondary | ICD-10-CM | POA: Diagnosis present

## 2021-11-02 DIAGNOSIS — E785 Hyperlipidemia, unspecified: Secondary | ICD-10-CM | POA: Diagnosis present

## 2021-11-02 DIAGNOSIS — E1169 Type 2 diabetes mellitus with other specified complication: Secondary | ICD-10-CM

## 2021-11-02 DIAGNOSIS — Z7901 Long term (current) use of anticoagulants: Secondary | ICD-10-CM | POA: Diagnosis not present

## 2021-11-02 DIAGNOSIS — I50811 Acute right heart failure: Secondary | ICD-10-CM | POA: Diagnosis present

## 2021-11-02 DIAGNOSIS — F17210 Nicotine dependence, cigarettes, uncomplicated: Secondary | ICD-10-CM | POA: Diagnosis present

## 2021-11-02 DIAGNOSIS — I2781 Cor pulmonale (chronic): Secondary | ICD-10-CM | POA: Diagnosis present

## 2021-11-02 DIAGNOSIS — R778 Other specified abnormalities of plasma proteins: Secondary | ICD-10-CM | POA: Diagnosis not present

## 2021-11-02 DIAGNOSIS — Z841 Family history of disorders of kidney and ureter: Secondary | ICD-10-CM

## 2021-11-02 DIAGNOSIS — Z794 Long term (current) use of insulin: Secondary | ICD-10-CM

## 2021-11-02 DIAGNOSIS — I50810 Right heart failure, unspecified: Secondary | ICD-10-CM | POA: Diagnosis present

## 2021-11-02 DIAGNOSIS — I10 Essential (primary) hypertension: Secondary | ICD-10-CM | POA: Diagnosis present

## 2021-11-02 DIAGNOSIS — J849 Interstitial pulmonary disease, unspecified: Secondary | ICD-10-CM | POA: Diagnosis not present

## 2021-11-02 DIAGNOSIS — R1084 Generalized abdominal pain: Secondary | ICD-10-CM | POA: Diagnosis not present

## 2021-11-02 DIAGNOSIS — I110 Hypertensive heart disease with heart failure: Secondary | ICD-10-CM | POA: Diagnosis not present

## 2021-11-02 DIAGNOSIS — I517 Cardiomegaly: Secondary | ICD-10-CM | POA: Diagnosis not present

## 2021-11-02 DIAGNOSIS — I5031 Acute diastolic (congestive) heart failure: Secondary | ICD-10-CM | POA: Diagnosis not present

## 2021-11-02 DIAGNOSIS — E039 Hypothyroidism, unspecified: Secondary | ICD-10-CM | POA: Diagnosis present

## 2021-11-02 DIAGNOSIS — K5900 Constipation, unspecified: Secondary | ICD-10-CM | POA: Diagnosis not present

## 2021-11-02 DIAGNOSIS — I5033 Acute on chronic diastolic (congestive) heart failure: Secondary | ICD-10-CM | POA: Diagnosis present

## 2021-11-02 DIAGNOSIS — Z79890 Hormone replacement therapy (postmenopausal): Secondary | ICD-10-CM

## 2021-11-02 DIAGNOSIS — Z83438 Family history of other disorder of lipoprotein metabolism and other lipidemia: Secondary | ICD-10-CM

## 2021-11-02 DIAGNOSIS — Z8616 Personal history of COVID-19: Secondary | ICD-10-CM

## 2021-11-02 DIAGNOSIS — N179 Acute kidney failure, unspecified: Secondary | ICD-10-CM | POA: Diagnosis present

## 2021-11-02 DIAGNOSIS — I2729 Other secondary pulmonary hypertension: Secondary | ICD-10-CM | POA: Diagnosis not present

## 2021-11-02 DIAGNOSIS — F39 Unspecified mood [affective] disorder: Secondary | ICD-10-CM | POA: Diagnosis present

## 2021-11-02 DIAGNOSIS — Z86711 Personal history of pulmonary embolism: Secondary | ICD-10-CM

## 2021-11-02 DIAGNOSIS — E1165 Type 2 diabetes mellitus with hyperglycemia: Secondary | ICD-10-CM

## 2021-11-02 DIAGNOSIS — Z833 Family history of diabetes mellitus: Secondary | ICD-10-CM

## 2021-11-02 DIAGNOSIS — Z28310 Unvaccinated for covid-19: Secondary | ICD-10-CM | POA: Diagnosis not present

## 2021-11-02 DIAGNOSIS — Z8249 Family history of ischemic heart disease and other diseases of the circulatory system: Secondary | ICD-10-CM | POA: Diagnosis not present

## 2021-11-02 DIAGNOSIS — I272 Pulmonary hypertension, unspecified: Secondary | ICD-10-CM | POA: Diagnosis not present

## 2021-11-02 DIAGNOSIS — Z79899 Other long term (current) drug therapy: Secondary | ICD-10-CM

## 2021-11-02 DIAGNOSIS — R0789 Other chest pain: Secondary | ICD-10-CM | POA: Diagnosis not present

## 2021-11-02 DIAGNOSIS — E119 Type 2 diabetes mellitus without complications: Secondary | ICD-10-CM | POA: Diagnosis not present

## 2021-11-02 DIAGNOSIS — R1031 Right lower quadrant pain: Secondary | ICD-10-CM | POA: Diagnosis not present

## 2021-11-02 DIAGNOSIS — Z9851 Tubal ligation status: Secondary | ICD-10-CM

## 2021-11-02 DIAGNOSIS — R109 Unspecified abdominal pain: Secondary | ICD-10-CM | POA: Diagnosis present

## 2021-11-02 LAB — CBC WITH DIFFERENTIAL/PLATELET
Abs Immature Granulocytes: 0.06 10*3/uL (ref 0.00–0.07)
Basophils Absolute: 0 10*3/uL (ref 0.0–0.1)
Basophils Relative: 0 %
Eosinophils Absolute: 0 10*3/uL (ref 0.0–0.5)
Eosinophils Relative: 0 %
HCT: 48.4 % — ABNORMAL HIGH (ref 36.0–46.0)
Hemoglobin: 15.8 g/dL — ABNORMAL HIGH (ref 12.0–15.0)
Immature Granulocytes: 1 %
Lymphocytes Relative: 20 %
Lymphs Abs: 1.9 10*3/uL (ref 0.7–4.0)
MCH: 34.2 pg — ABNORMAL HIGH (ref 26.0–34.0)
MCHC: 32.6 g/dL (ref 30.0–36.0)
MCV: 104.8 fL — ABNORMAL HIGH (ref 80.0–100.0)
Monocytes Absolute: 0.5 10*3/uL (ref 0.1–1.0)
Monocytes Relative: 5 %
Neutro Abs: 7 10*3/uL (ref 1.7–7.7)
Neutrophils Relative %: 74 %
Platelets: 151 10*3/uL (ref 150–400)
RBC: 4.62 MIL/uL (ref 3.87–5.11)
RDW: 15.8 % — ABNORMAL HIGH (ref 11.5–15.5)
WBC: 9.5 10*3/uL (ref 4.0–10.5)
nRBC: 2 % — ABNORMAL HIGH (ref 0.0–0.2)

## 2021-11-02 LAB — COMPREHENSIVE METABOLIC PANEL
ALT: 21 U/L (ref 0–44)
AST: 27 U/L (ref 15–41)
Albumin: 3.5 g/dL (ref 3.5–5.0)
Alkaline Phosphatase: 49 U/L (ref 38–126)
Anion gap: 9 (ref 5–15)
BUN: 28 mg/dL — ABNORMAL HIGH (ref 8–23)
CO2: 24 mmol/L (ref 22–32)
Calcium: 9.6 mg/dL (ref 8.9–10.3)
Chloride: 106 mmol/L (ref 98–111)
Creatinine, Ser: 1.29 mg/dL — ABNORMAL HIGH (ref 0.44–1.00)
GFR, Estimated: 42 mL/min — ABNORMAL LOW (ref >60)
Glucose, Bld: 120 mg/dL — ABNORMAL HIGH (ref 70–99)
Potassium: 4.4 mmol/L (ref 3.5–5.1)
Sodium: 139 mmol/L (ref 135–145)
Total Bilirubin: 1.3 mg/dL — ABNORMAL HIGH (ref 0.3–1.2)
Total Protein: 7.8 g/dL (ref 6.5–8.1)

## 2021-11-02 LAB — URINALYSIS, ROUTINE W REFLEX MICROSCOPIC
Bacteria, UA: NONE SEEN
Bilirubin Urine: NEGATIVE
Glucose, UA: NEGATIVE mg/dL
Hgb urine dipstick: NEGATIVE
Ketones, ur: NEGATIVE mg/dL
Leukocytes,Ua: NEGATIVE
Nitrite: NEGATIVE
Protein, ur: 30 mg/dL — AB
Specific Gravity, Urine: 1.013 (ref 1.005–1.030)
pH: 5 (ref 5.0–8.0)

## 2021-11-02 LAB — LIPASE, BLOOD: Lipase: 35 U/L (ref 11–51)

## 2021-11-02 NOTE — ED Triage Notes (Signed)
Pt states that she has been constipated for the past week and feels her abd is swollen and feeling nauseated

## 2021-11-02 NOTE — ED Provider Notes (Signed)
Emergency Medicine Provider Triage Evaluation Note  Elizabeth Melton , a 80 y.o. female  was evaluated in triage.  Pt complains of abd pain/distension x1 week. C/o constipation as well.  Review of Systems  Positive: Abd pain, constipation, nausea Negative: vomiting  Physical Exam  BP (!) 161/70 (BP Location: Right Arm)   Pulse 80   Temp 97.7 F (36.5 C) (Oral)   Resp 20   SpO2 100%  Gen:   Awake, no distress   Resp:  Normal effort  MSK:   Moves extremities without difficulty  Other:  Abd soft, no focal ttp  Medical Decision Making  Medically screening exam initiated at 8:38 PM.  Appropriate orders placed.  Elizabeth Melton was informed that the remainder of the evaluation will be completed by another provider, this initial triage assessment does not replace that evaluation, and the importance of remaining in the ED until their evaluation is complete.     [place] 11/02/21 2038    Regan Lemming, MD 11/02/21 2136

## 2021-11-03 ENCOUNTER — Other Ambulatory Visit: Payer: Self-pay

## 2021-11-03 ENCOUNTER — Emergency Department (HOSPITAL_COMMUNITY): Payer: Medicare HMO

## 2021-11-03 ENCOUNTER — Encounter (HOSPITAL_COMMUNITY): Payer: Self-pay | Admitting: Internal Medicine

## 2021-11-03 ENCOUNTER — Other Ambulatory Visit (HOSPITAL_COMMUNITY): Payer: Medicare HMO

## 2021-11-03 DIAGNOSIS — I272 Pulmonary hypertension, unspecified: Secondary | ICD-10-CM

## 2021-11-03 DIAGNOSIS — I5033 Acute on chronic diastolic (congestive) heart failure: Secondary | ICD-10-CM | POA: Diagnosis not present

## 2021-11-03 DIAGNOSIS — R778 Other specified abnormalities of plasma proteins: Secondary | ICD-10-CM | POA: Diagnosis not present

## 2021-11-03 DIAGNOSIS — E039 Hypothyroidism, unspecified: Secondary | ICD-10-CM | POA: Diagnosis not present

## 2021-11-03 DIAGNOSIS — N179 Acute kidney failure, unspecified: Secondary | ICD-10-CM

## 2021-11-03 DIAGNOSIS — R109 Unspecified abdominal pain: Secondary | ICD-10-CM | POA: Diagnosis present

## 2021-11-03 DIAGNOSIS — I10 Essential (primary) hypertension: Secondary | ICD-10-CM

## 2021-11-03 DIAGNOSIS — J849 Interstitial pulmonary disease, unspecified: Secondary | ICD-10-CM

## 2021-11-03 DIAGNOSIS — I480 Paroxysmal atrial fibrillation: Secondary | ICD-10-CM | POA: Diagnosis not present

## 2021-11-03 DIAGNOSIS — I5032 Chronic diastolic (congestive) heart failure: Secondary | ICD-10-CM | POA: Diagnosis not present

## 2021-11-03 DIAGNOSIS — E119 Type 2 diabetes mellitus without complications: Secondary | ICD-10-CM | POA: Diagnosis not present

## 2021-11-03 DIAGNOSIS — R079 Chest pain, unspecified: Secondary | ICD-10-CM | POA: Diagnosis not present

## 2021-11-03 DIAGNOSIS — K5900 Constipation, unspecified: Secondary | ICD-10-CM | POA: Diagnosis present

## 2021-11-03 DIAGNOSIS — R1084 Generalized abdominal pain: Secondary | ICD-10-CM

## 2021-11-03 DIAGNOSIS — Z720 Tobacco use: Secondary | ICD-10-CM | POA: Diagnosis not present

## 2021-11-03 DIAGNOSIS — R1031 Right lower quadrant pain: Secondary | ICD-10-CM | POA: Diagnosis not present

## 2021-11-03 DIAGNOSIS — I517 Cardiomegaly: Secondary | ICD-10-CM | POA: Diagnosis not present

## 2021-11-03 LAB — GLUCOSE, CAPILLARY
Glucose-Capillary: 121 mg/dL — ABNORMAL HIGH (ref 70–99)
Glucose-Capillary: 177 mg/dL — ABNORMAL HIGH (ref 70–99)
Glucose-Capillary: 120 mg/dL — ABNORMAL HIGH (ref 70–99)

## 2021-11-03 LAB — TROPONIN I (HIGH SENSITIVITY)
Troponin I (High Sensitivity): 43 ng/L — ABNORMAL HIGH (ref <18)
Troponin I (High Sensitivity): 82 ng/L — ABNORMAL HIGH (ref <18)
Troponin I (High Sensitivity): 75 ng/L — ABNORMAL HIGH (ref <18)
Troponin I (High Sensitivity): 57 ng/L — ABNORMAL HIGH (ref <18)

## 2021-11-03 LAB — BRAIN NATRIURETIC PEPTIDE: B Natriuretic Peptide: 631.9 pg/mL — ABNORMAL HIGH (ref 0.0–100.0)

## 2021-11-03 LAB — RESP PANEL BY RT-PCR (FLU A&B, COVID) ARPGX2
Influenza A by PCR: NEGATIVE
Influenza B by PCR: NEGATIVE
SARS Coronavirus 2 by RT PCR: NEGATIVE

## 2021-11-03 LAB — MRSA NEXT GEN BY PCR, NASAL: MRSA by PCR Next Gen: NOT DETECTED

## 2021-11-03 LAB — HEPARIN LEVEL (UNFRACTIONATED): Heparin Unfractionated: 0.98 IU/mL — ABNORMAL HIGH (ref 0.30–0.70)

## 2021-11-03 LAB — APTT: aPTT: 50 seconds — ABNORMAL HIGH (ref 24–36)

## 2021-11-03 MED ORDER — HEPARIN (PORCINE) 25000 UT/250ML-% IV SOLN
1100.0000 [IU]/h | INTRAVENOUS | Status: DC
  Administered 2021-11-03: 1000 [IU]/h via INTRAVENOUS
  Administered 2021-11-04 – 2021-11-07 (×4): 1100 [IU]/h via INTRAVENOUS
  Filled 2021-11-03 (×5): qty 250

## 2021-11-03 MED ORDER — ONDANSETRON HCL 4 MG/2ML IJ SOLN: 4.0000 mg | Freq: Four times a day (QID) | INTRAMUSCULAR | Status: DC | PRN

## 2021-11-03 MED ORDER — POTASSIUM CHLORIDE CRYS ER 20 MEQ PO TBCR
40.0000 meq | EXTENDED_RELEASE_TABLET | Freq: Once | ORAL | Status: AC
  Administered 2021-11-03: 40 meq via ORAL
  Filled 2021-11-03: qty 2

## 2021-11-03 MED ORDER — DOCUSATE SODIUM 100 MG PO CAPS
100.0000 mg | ORAL_CAPSULE | Freq: Two times a day (BID) | ORAL | Status: DC
  Administered 2021-11-03 – 2021-11-07 (×9): 100 mg via ORAL
  Filled 2021-11-03 (×9): qty 1

## 2021-11-03 MED ORDER — BUPROPION HCL ER (XL) 150 MG PO TB24
150.0000 mg | ORAL_TABLET | Freq: Every day | ORAL | Status: DC
  Administered 2021-11-03 – 2021-11-07 (×5): 150 mg via ORAL
  Filled 2021-11-03 (×6): qty 1

## 2021-11-03 MED ORDER — METOPROLOL SUCCINATE ER 25 MG PO TB24
12.5000 mg | ORAL_TABLET | Freq: Every day | ORAL | Status: DC
  Administered 2021-11-03 – 2021-11-07 (×5): 12.5 mg via ORAL
  Filled 2021-11-03 (×5): qty 1

## 2021-11-03 MED ORDER — BISACODYL 5 MG PO TBEC
5.0000 mg | DELAYED_RELEASE_TABLET | Freq: Once | ORAL | Status: AC
  Administered 2021-11-03: 5 mg via ORAL
  Filled 2021-11-03: qty 1

## 2021-11-03 MED ORDER — PRAVASTATIN SODIUM 40 MG PO TABS
40.0000 mg | ORAL_TABLET | Freq: Every day | ORAL | Status: DC
  Administered 2021-11-03 – 2021-11-06 (×4): 40 mg via ORAL
  Filled 2021-11-03 (×4): qty 1

## 2021-11-03 MED ORDER — ACETAMINOPHEN 325 MG PO TABS
650.0000 mg | ORAL_TABLET | ORAL | Status: DC | PRN
  Administered 2021-11-05: 650 mg via ORAL
  Filled 2021-11-03: qty 2

## 2021-11-03 MED ORDER — APIXABAN 5 MG PO TABS: 5.0000 mg | ORAL_TABLET | Freq: Two times a day (BID) | ORAL | Status: DC

## 2021-11-03 MED ORDER — NICOTINE 14 MG/24HR TD PT24
14.0000 mg | MEDICATED_PATCH | Freq: Every day | TRANSDERMAL | Status: DC
  Administered 2021-11-03 – 2021-11-07 (×5): 14 mg via TRANSDERMAL
  Filled 2021-11-03 (×5): qty 1

## 2021-11-03 MED ORDER — LEVOTHYROXINE SODIUM 75 MCG PO TABS
75.0000 ug | ORAL_TABLET | Freq: Every day | ORAL | Status: DC
  Administered 2021-11-04 – 2021-11-07 (×4): 75 ug via ORAL
  Filled 2021-11-03 (×4): qty 1

## 2021-11-03 MED ORDER — HEPARIN BOLUS VIA INFUSION
2000.0000 [IU] | Freq: Once | INTRAVENOUS | Status: AC
  Administered 2021-11-03: 2000 [IU] via INTRAVENOUS
  Filled 2021-11-03: qty 2000

## 2021-11-03 MED ORDER — IOHEXOL 300 MG/ML  SOLN
80.0000 mL | Freq: Once | INTRAMUSCULAR | Status: AC | PRN
  Administered 2021-11-03: 80 mL via INTRAVENOUS

## 2021-11-03 MED ORDER — INSULIN ASPART 100 UNIT/ML IJ SOLN
0.0000 [IU] | Freq: Every day | INTRAMUSCULAR | Status: DC
  Administered 2021-11-05 – 2021-11-06 (×2): 2 [IU] via SUBCUTANEOUS

## 2021-11-03 MED ORDER — INSULIN ASPART 100 UNIT/ML IJ SOLN
0.0000 [IU] | Freq: Three times a day (TID) | INTRAMUSCULAR | Status: DC
Start: 2021-11-03 — End: 2021-11-07
  Administered 2021-11-03: 3 [IU] via SUBCUTANEOUS
  Administered 2021-11-03: 2 [IU] via SUBCUTANEOUS
  Administered 2021-11-04: 8 [IU] via SUBCUTANEOUS
  Administered 2021-11-04 – 2021-11-05 (×2): 2 [IU] via SUBCUTANEOUS
  Administered 2021-11-05: 8 [IU] via SUBCUTANEOUS
  Administered 2021-11-05: 2 [IU] via SUBCUTANEOUS
  Administered 2021-11-06: 08:00:00 3 [IU] via SUBCUTANEOUS
  Administered 2021-11-06: 12:00:00 8 [IU] via SUBCUTANEOUS
  Administered 2021-11-07 (×2): 3 [IU] via SUBCUTANEOUS

## 2021-11-03 MED ORDER — FUROSEMIDE 10 MG/ML IJ SOLN
80.0000 mg | Freq: Two times a day (BID) | INTRAMUSCULAR | Status: DC
  Administered 2021-11-03 – 2021-11-04 (×3): 80 mg via INTRAVENOUS
  Filled 2021-11-03 (×3): qty 8

## 2021-11-03 NOTE — ED Provider Notes (Signed)
  Physical Exam  BP (!) 163/67   Pulse 77   Temp 99 F (37.2 C) (Oral)   Resp 20   Ht _0  (1.676 m)   Wt 75.8 kg   SpO2 99%   BMI 26.95 kg/m   Physical Exam Vitals and nursing note reviewed.  Constitutional:      General: She is not in acute distress.    Appearance: She is well-developed.     Comments: nontoxic  HENT:     Head: Normocephalic and atraumatic.  Eyes:     Extraocular Movements: Extraocular movements intact.  Cardiovascular:     Rate and Rhythm: Normal rate.  Pulmonary:     Effort: Pulmonary effort is normal.  Abdominal:     General: There is no distension.  Musculoskeletal:        General: Normal range of motion.     Cervical back: Normal range of motion.  Skin:    General: Skin is warm.     Findings: No rash.  Neurological:     Mental Status: She is alert and oriented to person, place, and time.    ED Course/Procedures     Procedures  MDM   Pt signed out to me by Lawerance Bach, PA-C. Please see previous notes for further history.   In brief, patient presenting for evaluation of intermittent right-sided chest pain and abnormal bowel movements.  She reports less robust stools recently, as well as some abdominal bloating.  In the last 24 hours, she has had intermittent right-sided chest pain.  She has a history of PE, remains on Eliquis.  Labs show minimally elevated troponin at 43, which might be patient's baseline.  However delta is pending.  EKG does show new lateral T wave inversions.  Otherwise labs are reassuring.  CT abdomen pelvis negative.  Chest x-ray shows chronic lung disease, unchanged.  Repeat troponin unfortunately doubled from the first.  In the setting of chest pain and troponin changes, patient will need to be admitted.  Discussed with Dr. Lorin Mercy from [facility], patient to be admitted.  Discussed with Dr. Margaretann Loveless from cardiology who will consult on pt while she is admitted.           Franchot Heidelberg, PA-C 11/03/21  [place], [place], DO 11/03/21 [phone]

## 2021-11-03 NOTE — Progress Notes (Signed)
[place] for heparin Indication: chest pain/ACS  Allergies  Allergen Reactions   Lisinopril     Other reaction(s): cough 09/20/17   Losartan Other (See Comments)    Hallucinations     Patient Measurements: Height: _0  (167.6 cm) Weight: 76 kg (167 lb 8.8 oz) IBW/kg (Calculated) : 59.3 Heparin Dosing Weight: 76kg  Vital Signs: Temp: 97.9 F (36.6 C) (11/24 1115) Temp Source: Oral (11/24 1115) BP: 130/61 (11/24 0800) Pulse Rate: 74 (11/24 0800)  Labs: Recent Labs    11/02/21 2109 11/03/21 0426 11/03/21 0630  HGB 15.8*  --   --   HCT 48.4*  --   --   PLT 151  --   --   CREATININE 1.29*  --   --   TROPONINIHS  --  43* 82*    Estimated Creatinine Clearance: 36.8 mL/min (A) (by C-G formula based on SCr of 1.29 mg/dL (H)).   Medical History: Past Medical History:  Diagnosis Date   Acute on chronic diastolic (congestive) heart failure ([place]) 05/19/2021   Diabetes mellitus without complication (HCC)    Hyperlipidemia    Hypertension    Hypothyroidism    IPF (idiopathic pulmonary fibrosis) (HCC)    Thrombocytopenia (HCC)     Assessment: 80 yoF on apixaban PTA for hx PE with concern for ACS. Apixaban to transition to IV heparin for ischemic workup. Last apixaban dose was PTA 11/23 ~0800. Apixaban likely still somewhat present so will only give small bolus.  Goal of Therapy:  Heparin level 0.3-0.7 units/ml aPTT 66-102 seconds Monitor platelets by anticoagulation protocol: Yes   Plan:  Heparin 2000 units x1 Heparin 1000 units/h  Check heparin level and aPTT in 8h - likely need to use aPTT  Arrie Senate, PharmD, BCPS, [facility] Clinical Pharmacist [phone] Please check AMION for all [place] numbers 11/03/2021

## 2021-11-03 NOTE — H&P (Signed)
History and Physical    Elizabeth Melton:[id number] DOB: 08/18/1941 DOA: 11/02/2021  PCP: Kathyrn Lass, MD Consultants:  Gardiner Rhyme - cardiology; [name] - pulmonology; Scot Dock - vascular surgery Patient coming from:  Home - lives with oldest daughter; NOK: Daughter, Elizabeth Melton, [phone]   Chief Complaint: Abdominal pain  HPI: Elizabeth Melton is a 80 y.o. female with medical history significant of chronic diastolic CHF; DM; HTN; HLD; hypothyroidism; PE on Eliquis; and pulmonary fibrosis on 3L home O2 presenting with abdominal pain.  She reports that her stomach was swollen up and she was noticing swelling onto her flank and back.  It was like that for a week or more.  No pain consistently, but she did have occasional RLQ pain.  Since she has been at the hospital, she has had little pain.  She has been very constipated, no good BM in over a week.  She takes a laxative at night with minimal results.  She did have some R-sided chest pain this week, a couple of days.  It still comes and goes.      ED Course: Abdominal bloating, constipation, abd/chest pain.  Troponin uptrending, concern for ACS.  Has not had a recent LHC (RHC due to lung disease).  Will consult cardiology.  Review of Systems: As per HPI; otherwise review of systems reviewed and negative.   Ambulatory Status:  Ambulates with rollator  COVID Vaccine Status:  None  Past Medical History:  Diagnosis Date   Acute on chronic diastolic (congestive) heart failure ([place]) 05/19/2021   Diabetes mellitus without complication (HCC)    Hyperlipidemia    Hypertension    Hypothyroidism    IPF (idiopathic pulmonary fibrosis) (HCC)    Thrombocytopenia (HCC)     Past Surgical History:  Procedure Laterality Date   RIGHT HEART CATH N/A 07/05/2021   Procedure: RIGHT HEART CATH;  Surgeon: [name], MD;  Location: [facility];  Service: Cardiovascular;  Laterality: N/A;   TUBAL LIGATION      Social History    Socioeconomic History   Marital status: Widowed    Spouse name: Not on file   Number of children: Not on file   Years of education: Not on file   Highest education level: Not on file  Occupational History   Occupation: retired  Tobacco Use   Smoking status: Every Day    Packs/day: 1.00    Years: 60.00    Pack years: 60.00    Types: Cigarettes    Start date: 12/11/1958   Smokeless tobacco: Former    Quit date: 09/24/1962   Tobacco comments:    Smokes 3-4 cigs/week currently, wearing patch  Vaping Use   Vaping Use: Never used  Substance and Sexual Activity   Alcohol use: Yes    Comment: drinks beer 12 every 2 weeks   Drug use: No   Sexual activity: Not on file  Other Topics Concern   Not on file  Social History Narrative   Not on file   Social Determinants of Health   Financial Resource Strain: Not on file  Food Insecurity: No Food Insecurity   Worried About [place] in the Last Year: Never true   [place] in the Last Year: Never true  Transportation Needs: No Transportation Needs   Lack of Transportation (Medical): No   Lack of Transportation (Non-Medical): No  Physical Activity: Not on file  Stress: Not on file  Social Connections: Not on file  Intimate Partner Violence: Not on file    Allergies  Allergen Reactions   Lisinopril     Other reaction(s): cough 09/20/17   Losartan Other (See Comments)    Hallucinations     Family History  Problem Relation Age of Onset   Diabetes Mother    Kidney disease Mother    Hypertension Mother    Other Father        tuberculosis   Hypertension Sister    Hyperlipidemia Sister     Prior to Admission medications   Medication Sig Start Date End Date Taking? Authorizing Provider  acetaminophen (TYLENOL) 500 MG tablet Take 500-1,000 mg by mouth every 8 (eight) hours as needed for headache or mild pain (pain).   Yes [provider]  apixaban (ELIQUIS) 5 MG TABS tablet Take 1 tablet (5 mg  total) by mouth 2 (two) times daily. 07/25/21  Yes [name], NP  b complex vitamins tablet Take 1 tablet by mouth daily.   Yes [provider]  buPROPion (WELLBUTRIN XL) 150 MG 24 hr tablet Take 150 mg by mouth daily.   Yes [provider]  insulin aspart (NOVOLOG) 100 UNIT/ML FlexPen Insulin sliding scale: Blood sugar  120-150   3units                       151-200   4units                       201-250   7units                       251- 300  11units                       301-350   15uints                       351-400   20units                       >400         call MD immediately 05/22/21  Yes [name], MD  Insulin Pen Needle (PEN NEEDLES 29GX1/2") 29G X 12MM MISC For insulin injection 05/22/21  Yes [name], MD  levothyroxine (SYNTHROID, LEVOTHROID) 75 MCG tablet Take 75 mcg by mouth daily before breakfast.    Yes [provider]  lovastatin (MEVACOR) 40 MG tablet Take 40 mg by mouth at bedtime.   Yes [provider]  nicotine (NICODERM CQ - DOSED IN MG/24 HOURS) 14 mg/24hr patch Place 14 mg onto the skin daily.   Yes [provider]  Omega-3 Fatty Acids (FISH OIL PO) Take 1,000 mg by mouth daily.   Yes [provider]  OXYGEN Inhale 3 L into the lungs continuous.   Yes [provider]  VITAMIN D PO Take 1,200 Units by mouth daily.   Yes [provider]  Blood Glucose Monitoring Suppl (TRUE METRIX METER) w/Device KIT 1 each by Other route in the morning and at bedtime. 12/25/20   [provider]  Calcium Carb-Cholecalciferol (CALCIUM + D3) 600-200 MG-UNIT TABS Take 1 tablet by mouth daily.    [provider]  furosemide (LASIX) 40 MG tablet Take 1 tablet (40 mg total) by mouth daily. 07/27/21 10/25/21  [name], MD  glucose blood test strip 1 each by Other route as  needed for other (blood sugar).    [provider]  insulin glargine (LANTUS) 100 UNIT/ML Solostar Pen Inject  10 Units into the skin daily for 10 days. Patient not taking: Reported on 09/20/2021 05/22/21 07/14/21  [name], MD  metoprolol succinate (TOPROL-XL) 25 MG 24 hr tablet Take 0.5 tablets (12.5 mg total) by mouth daily. 01/28/21   [name], MD  potassium chloride (KLOR-CON) 10 MEQ tablet TAKE 1 TABLET EVERY DAY Patient taking differently: 10 mEq daily. 07/27/21   [name], MD  predniSONE (DELTASONE) 10 MG tablet Take 2 tab 20 mg daily for 7 days, then take 10 mg daily. Patient not taking: Reported on 09/20/2021 06/28/21   [name], [name], MD  senna-docusate (SENOKOT-S) 8.6-50 MG tablet Take 1 tablet by mouth at bedtime as needed for mild constipation. Patient not taking: Reported on 09/20/2021 05/22/21   [name], MD  SMART SENSE THIN LANCETS 26G MISC 1 each by Does not apply route 2 (two) times daily.    [provider]  Tiotropium Bromide-Olodaterol (STIOLTO RESPIMAT) 2.5-2.5 MCG/ACT AERS Inhale 2 puffs into the lungs daily. Patient not taking: Reported on 09/20/2021 08/09/21   Brand Males, MD    Physical Exam: Vitals:   11/03/21 0500 11/03/21 0700 11/03/21 0800 11/03/21 1115  BP: (!) 148/73 130/61 130/61   Pulse: 74 75 74   Resp: _0 (!) 21  Temp:    97.9 F (36.6 C)  TempSrc:    Oral  SpO2: 97% 97% 100%   Weight:    76 kg  Height:    _1  (1.676 m)     General:  Appears calm and comfortable and is in NAD Eyes:  EOMI, normal lids, iris ENT:  grossly normal hearing, lips & tongue, mmm Neck:  no LAD, masses or thyromegaly Cardiovascular:  RRR, no m/r/g. 1+ LE edema.  Respiratory:   CTA bilaterally with no wheezes/rales/rhonchi.  Normal respiratory effort. Abdomen:  soft, NT, mildly distended Skin:  no rash or induration seen on limited exam Musculoskeletal:  grossly normal tone BUE/BLE, good ROM, no bony abnormality Psychiatric:  grossly normal mood and affect, speech fluent and appropriate, AOx3 Neurologic:  CN 2-12 grossly intact, moves  all extremities in coordinated fashion    Radiological Exams on Admission: Independently reviewed - see discussion in A/P where applicable  DG Chest 2 View  Result Date: 11/03/2021 CLINICAL DATA:  Chest pain. EXAM: CHEST - 2 VIEW COMPARISON:  08/17/2021. FINDINGS: The heart is enlarged. Atherosclerotic calcification of the aorta is noted. Extensive interstitial thickening is present bilaterally. Airspace opacities are noted in the peripheral aspects of the lungs, which is slightly increased on the right from the prior exam. There is mild blunting of the right costophrenic angle and a small pleural effusion can not be excluded. No pneumothorax is seen. IMPRESSION: 1. Cardiomegaly. 2. Diffuse interstitial prominence bilaterally, compatible with known pulmonary fibrosis. Increased airspace disease is noted in the lateral aspects of the right lung, which may represent superimposed edema, infection, or progression of interstitial lung disease. 3. Blunting of the right costophrenic angle, possible small pleural effusion. Electronically Signed   By: Brett Fairy M.D.   On: 11/03/2021 04:33   CT ABDOMEN PELVIS W CONTRAST  Result Date: 11/03/2021 CLINICAL DATA:  Right lower quadrant pain and abdominal distension EXAM: CT ABDOMEN AND PELVIS WITH CONTRAST TECHNIQUE: Multidetector CT imaging of the abdomen and pelvis was performed using the standard protocol following bolus administration of intravenous contrast. CONTRAST:  63m OMNIPAQUE IOHEXOL 300 MG/ML  SOLN COMPARISON:  None. FINDINGS: Lower chest: Mild fibrotic changes are noted in the bases bilaterally. No acute infiltrate is seen. Hepatobiliary: Fatty infiltration of the liver is noted. Gallbladder is well distended with multiple gallstones within. No ductal dilatation is seen. Pancreas: Unremarkable. No pancreatic ductal dilatation or surrounding inflammatory changes. Spleen: Normal in size without focal abnormality. Adrenals/Urinary Tract: The adrenal  glands are within normal limits. Kidneys demonstrate a normal enhancement pattern bilaterally. No obstructive changes are seen. A few small cysts are noted within the left kidney. No obstructive changes are seen. The bladder is partially distended. Stomach/Bowel: Mild diverticular change of the colon is noted without evidence of diverticulitis. The appendix is air-filled and within normal limits. Small bowel and stomach are unremarkable. Vascular/Lymphatic: Aortic atherosclerosis. No enlarged abdominal or pelvic lymph nodes. Reproductive: Multiple calcified uterine fibroids are noted. No adnexal mass is noted. Other: No abdominal wall hernia or abnormality. No abdominopelvic ascites. Musculoskeletal: No acute or significant osseous findings. Degenerative anterolisthesis of L4 on L5 is noted. IMPRESSION: Fatty liver and findings of cholelithiasis. No complicating factors are noted. Normal-appearing appendix. Diverticulosis without diverticulitis. Multiple uterine fibroids. Electronically Signed   By: MInez CatalinaM.D.   On: 11/03/2021 03:11    EKG: Independently reviewed.  NSR with rate 77; nonspecific ST changes with no evidence of acute ischemia   Labs on Admission: I have personally reviewed the available labs and imaging studies at the time of the admission.  Pertinent labs:   Glucose 120 BUN 28/Creatinine 1.29/GFR 42 Bili 1.3 HS troponin 43, 82 Unremarkable CBC UA 30 protein   Assessment/Plan Principal Problem:   Abdominal pain Active Problems:   Hypothyroidism   Hyperlipidemia   Diabetes mellitus without complication (HCC)   Essential (primary) hypertension   Tobacco abuse   ILD (interstitial lung disease) (HCC)   AF (paroxysmal atrial fibrillation) (HCC)   Chronic diastolic heart failure (HCC)   Elevated troponin   Abdominal pain -Likely associated with constipation, possibly volume overload -CT with fatty liver, cholelithiasis, and uterine fibroids - none of these appear to be  c/w current complaints -Will observe overnight with bowel regimen  Elevated troponin -Intermittent periodic R-sided chest pain -Non-cardiac by description -May be associated with volume overload -However, troponin mildly elevated on presentation and increased with + delta; likely associated with demand ischemia -Will monitor overnight on cardiac telemetry -Cardiology consultation -Echo ordered  ILD -Severe pulmonary hypertension -She does not complain of worsening respiratory issues -Continue home [place] O2 -Diuresis ordered  Chronic diastolic CHF -179/39/03echo with grade 2 diastolic dysfunction and severe pulmonary HTN -Concern for volume overload contributing to current presentation -Echo pending -Diuresis ordered by cardiology  DM -Will check A1c -She was on Lantus for 10 days and then it was not continued for unclear reasons -Cover with moderate-scale SSI   HTN -Continue Metoprolol  HLD -Continue Mevacor (Pravastatin formulary substitution) -Hold fish oil due to limited inpatient utility  Hypothyroidism -Continue Synthroid  H/o PE/Afib -Continue Eliquis (cardiology has asked to hold and start Heparin drip) -Rate controlled with metoprolol  Mood d/o -Continue Wellbutrin  Tobacco dependence -She continues to smoke - infrequently, but persistently -Patch continued -She understands that she needs to quit    Note: This patient has been tested and is negative for the novel coronavirus COVID-19. The patient has NOT been vaccinated against COVID-19.   Level of care: Telemetry Cardiac DVT prophylaxis: Heparin Code Status:  Full - confirmed with patient Family Communication:  None present; she did not ask that I speak with her family at the time of admission. Disposition Plan:  The patient is from: home  Anticipated d/c is to: home without [facility] services   Anticipated d/c date will depend on clinical response to treatment, but possibly as early as tomorrow if she has  excellent response to treatment  Patient is currently: acutely ill Consults called: Cardiology  Admission status:  It is my clinical opinion that referral for OBSERVATION is reasonable and necessary in this patient based on the above information provided. The aforementioned taken together are felt to place the patient at high risk for further clinical deterioration. However it is anticipated that the patient may be medically stable for discharge from the hospital within 24 to 48 hours.    Karmen Bongo MD [facility]   How to contact the [facility] Attending or Consulting provider [place] or covering provider during after hours [place], for this patient?  Check the care team in [facility] and look for a) attending/consulting [facility] provider listed and b) the [facility] team listed Log into [email or web address] and use [place]'s universal password to access. If you do not have the password, please contact the hospital operator. Locate the [facility] provider you are looking for under [facility] and page to a number that you can be directly reached. If you still have difficulty reaching the provider, please page the [facility] (Director on Call) for the Hospitalists listed on amion for assistance.   11/03/2021, 12:28 PM

## 2021-11-03 NOTE — Consult Note (Signed)
Cardiology Consultation:  Patient ID: [name] MRN: [id number]; DOB: 1941/09/28  Admit date: 11/02/2021 Date of Consult: 11/03/2021  Primary Care Provider: Kathyrn Lass, MD Primary Cardiologist: [name], MD  Primary Electrophysiologist:  None   Patient Profile:  Elizabeth Melton is a 80 y.o. 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  History of Present Illness:  Elizabeth Melton presents with 4 days of worsening abdominal pain.  She reports swelling in her abdomen inserted 4 days ago.  Symptoms have continued.  She reports 2 days of roughly cramping in her chest.  She describes tightness under the right breast.  It appears to come and go.  No trigger.  No alleviating factor.  She is noticeably volume overloaded.  She reports she has been taking her diuretics.  She reports she is trying to adhere to a low-salt diet.  She reports she has been wearing her oxygen.  She reports compliance with this.  On arrival to the emergency room vital signs are stable.  Heart rate 74, BP 130/61.  Laboratory data notable for AKI with creatinine up to 1.3.  T bili slightly elevated 1.3 as well.  eGFR 42 which is reduced from her baseline.  Cardiac enzymes 43 initially and 82 on repeat.  No BNP checked this admission.  Hemoglobin 15.8.  WBC 9.5.  MCV noted to be elevated 104.  COVID and flu test negative.  CT abdomen pelvis demonstrates fatty liver with cholelithiasis.  Chest x-ray demonstrated cardiomegaly with interstitial lung markings consistent with her pulmonary fibrosis.  Mention of possible increased right vascular congestion.  Her EKG was reviewed to demonstrate sinus rhythm.  She has nonspecific ST-T changes that are unchanged from prior.  Cardiac enzymes notable  for at bedtime troponin 43.  82 on repeat.  She reports no chest discomfort at the time my examination.  Again she appears volume overloaded to me.  Suspect this is more congestive heart failure with RV failure given her history.   Her medical history is notable for heart failure with preserved ejection fraction.  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  Most recent chest CT demonstrates concerns for interstitial lung disease.  She also had a submassive pulmonary embolism in September 2021.  VQ scan has been negative.  Recent right heart catheterization from July 2020 demonstrated mixed pulmonary hypertension.  She had a wedge pressure of 28 and a transpulmonary gradient of 19.  This equated to PVR of 6.3 L.  Heart Pathway Score:       Past Medical History: Past Medical History:  Diagnosis Date   Acute on chronic diastolic (congestive) heart failure ([place]) 05/19/2021   Diabetes mellitus without complication (HCC)    Hyperlipidemia    Hypertension    Hypothyroidism    IPF (idiopathic pulmonary fibrosis) (HCC)    Thrombocytopenia (HCC)     Past Surgical History: Past Surgical History:  Procedure Laterality Date   RIGHT HEART CATH N/A 07/05/2021   Procedure: RIGHT HEART CATH;  Surgeon: [name], MD;  Location: [facility];  Service: Cardiovascular;  Laterality: N/A;   TUBAL LIGATION       Home Medications:  Prior to Admission medications   Medication Sig Start Date End Date Taking? Authorizing Provider  acetaminophen (TYLENOL)  500 MG tablet Take 500-1,000 mg by mouth every 8 (eight) hours as needed for headache or mild pain (pain).   Yes [provider]  apixaban (ELIQUIS) 5 MG TABS tablet Take 1 tablet (5 mg total) by mouth 2 (two) times daily. 07/25/21  Yes [name], NP  b complex vitamins tablet Take 1 tablet by mouth daily.   Yes [provider]  buPROPion (WELLBUTRIN XL) 150 MG 24 hr tablet Take 150 mg by mouth  daily.   Yes [provider]  insulin aspart (NOVOLOG) 100 UNIT/ML FlexPen Insulin sliding scale: Blood sugar  120-150   3units                       151-200   4units                       201-250   7units                       251- 300  11units                       301-350   15uints                       351-400   20units                       >400         call MD immediately 05/22/21  Yes [name], MD  Insulin Pen Needle (PEN NEEDLES 29GX1/2") 29G X 12MM MISC For insulin injection 05/22/21  Yes [name], MD  levothyroxine (SYNTHROID, LEVOTHROID) 75 MCG tablet Take 75 mcg by mouth daily before breakfast.    Yes [provider]  lovastatin (MEVACOR) 40 MG tablet Take 40 mg by mouth at bedtime.   Yes [provider]  nicotine (NICODERM CQ - DOSED IN MG/24 HOURS) 14 mg/24hr patch Place 14 mg onto the skin daily.   Yes [provider]  Omega-3 Fatty Acids (FISH OIL PO) Take 1,000 mg by mouth daily.   Yes [provider]  OXYGEN Inhale 3 L into the lungs continuous.   Yes [provider]  VITAMIN D PO Take 1,200 Units by mouth daily.   Yes [provider]  Blood Glucose Monitoring Suppl (TRUE METRIX METER) w/Device KIT 1 each by Other route in the morning and at bedtime. 12/25/20   [provider]  Calcium Carb-Cholecalciferol (CALCIUM + D3) 600-200 MG-UNIT TABS Take 1 tablet by mouth daily.    [provider]  furosemide (LASIX) 40 MG tablet Take 1 tablet (40 mg total) by mouth daily. 07/27/21 10/25/21  [name], MD  glucose blood test strip 1 each by Other route as needed for other (blood sugar).    [provider]  insulin glargine (LANTUS) 100 UNIT/ML Solostar Pen Inject 10 Units into the skin daily for 10 days. Patient not taking: Reported on 09/20/2021 05/22/21 07/14/21  [name], MD  metoprolol succinate (TOPROL-XL) 25 MG 24 hr tablet Take 0.5 tablets (12.5 mg total) by mouth daily. 01/28/21    [name], MD  potassium chloride (KLOR-CON) 10 MEQ tablet TAKE 1 TABLET EVERY DAY Patient taking differently: 10 mEq daily. 07/27/21   [name], MD  SMART SENSE THIN LANCETS 26G MISC 1 each by Does not apply route  2 (two) times daily.    [provider]    Inpatient Medications: Scheduled Meds:  apixaban  5 mg Oral BID   bisacodyl  5 mg Oral Once   buPROPion  150 mg Oral Daily   docusate sodium  100 mg Oral BID   insulin aspart  0-15 Units Subcutaneous TID WC   insulin aspart  0-5 Units Subcutaneous QHS   [START ON 11/04/2021] levothyroxine  75 mcg Oral QAC breakfast   metoprolol succinate  12.5 mg Oral Daily   nicotine  14 mg Transdermal Daily   pravastatin  40 mg Oral q1800   sodium chloride flush  3 mL Intravenous Q12H   Continuous Infusions:  PRN Meds: acetaminophen, ondansetron (ZOFRAN) IV  Allergies:    Allergies  Allergen Reactions   Lisinopril     Other reaction(s): cough 09/20/17   Losartan Other (See Comments)    Hallucinations     Social History:   Social History   Socioeconomic History   Marital status: Widowed    Spouse name: Not on file   Number of children: Not on file   Years of education: Not on file   Highest education level: Not on file  Occupational History   Occupation: retired  Tobacco Use   Smoking status: Every Day    Packs/day: 1.00    Years: 60.00    Pack years: 60.00    Types: Cigarettes    Start date: 12/11/1958   Smokeless tobacco: Former    Quit date: 09/24/1962   Tobacco comments:    Smokes 3-4 cigs/week currently, wearing patch  Vaping Use   Vaping Use: Never used  Substance and Sexual Activity   Alcohol use: Yes    Comment: drinks beer 12 every 2 weeks   Drug use: No   Sexual activity: Not on file  Other Topics Concern   Not on file  Social History Narrative   Not on file   Social Determinants of Health   Financial Resource Strain: Not on file  Food Insecurity: No Food Insecurity    Worried About [place] in the Last Year: Never true   [place] in the Last Year: Never true  Transportation Needs: No Transportation Needs   Lack of Transportation (Medical): No   Lack of Transportation (Non-Medical): No  Physical Activity: Not on file  Stress: Not on file  Social Connections: Not on file  Intimate Partner Violence: Not on file     Family History:    Family History  Problem Relation Age of Onset   Diabetes Mother    Kidney disease Mother    Hypertension Mother    Other Father        tuberculosis   Hypertension Sister    Hyperlipidemia Sister      ROS:  All other ROS reviewed and negative. Pertinent positives noted in the HPI.     Physical Exam/Data:   Vitals:   11/03/21 0430 11/03/21 0500 11/03/21 0700 11/03/21 0800  BP: (!) 163/67 (!) 148/73 130/61 130/61  Pulse: 77 74 75 74  Resp: _0 Temp:      TempSrc:      SpO2: 99% 97% 97% 100%  Weight:      Height:       No intake or output data in the 24 hours ending 11/03/21 1040  Last 3 Weights 11/03/2021 09/20/2021 08/09/2021  Weight (lbs) 167 lb 168 lb 12.8 oz 161 lb  Weight (  kg) 75.751 kg 76.567 kg 73.029 kg    Body mass index is 26.95 kg/m.  General: Well nourished, well developed, in no acute distress Head: Atraumatic, normal size  Eyes: PEERLA, EOMI  Neck: Supple, JVD elevated to earlobes Endocrine: No thryomegaly Cardiac: Normal S1, S2; RRR; 2 out of 6 holosystolic murmur Lungs: Diffuse crackles Abd: Distended abdomen Ext: 1+ pitting edema up to the knees Musculoskeletal: No deformities, BUE and BLE strength normal and equal Skin: Warm and dry, no rashes   Neuro: Alert and oriented to person, place, time, and situation, CNII-XII grossly intact, no focal deficits  Psych: Normal mood and affect   EKG:  The EKG was personally reviewed and demonstrates: Normal sinus rhythm heart 77, no acute ischemic changes, unchanged from EKG dated 09/20/2021  Relevant CV  Studies: TTE 10/05/2021  1. Left ventricular ejection fraction, by estimation, is 60 to 65%. The  left ventricle has normal function. The left ventricle has no regional  wall motion abnormalities. There is mild left ventricular hypertrophy of  the posterior segment. Left  ventricular diastolic parameters are consistent with Grade II diastolic  dysfunction (pseudonormalization). The average left ventricular global  longitudinal strain is -15.5 %. The global longitudinal strain is normal.   2. Right ventricular systolic function is low normal. The right  ventricular size is mildly enlarged. There is severely elevated pulmonary  artery systolic pressure. The estimated right ventricular systolic  pressure is 98.9 mmHg.   3. Left atrial size was moderately dilated.   4. Right atrial size was mild to moderately dilated.   5. The mitral valve is degenerative. Mild to moderate mitral valve  regurgitation. No evidence of mitral stenosis.   6. Tricuspid valve regurgitation is moderate.   7. The aortic valve is calcified. Aortic valve regurgitation is moderate.  Mild aortic valve stenosis. Aortic regurgitation PHT measures 281 msec.  Aortic valve area, by VTI measures 1.23 cm. Aortic valve mean gradient  measures 9.2 mmHg. Aortic valve  Vmax measures 2.14 m/s.   8. The inferior vena cava is normal in size with greater than 50%  respiratory variability, suggesting right atrial pressure of 3 mmHg.   9. Compared to echo dated 05/30/2021, PASP has increased from 62.5 to  89.72mHg.   [place] Hemodynamic findings consistent with severe pulmonary hypertension.   Severe Mixed Pulmonary Hypertension-improved with oxygen: TPG 19 mmHg Mean PAP 47 mmHg this is a 36 mmHg with 3 L [place] PCWP elevated at 28 mmHg Severely reduced Cardiac Output-Index by both Fick (3.04-1.65) and Thermodilution (3.96-2.15)  modestly improves with 3L [place] O2 (3.21-1.74) Significant oxygen responsiveness-SaO2 dropped to 83%  on room air, improved to 98% on 3L [place] oxygen.  Laboratory Data: High Sensitivity Troponin:   Recent Labs  Lab 11/03/21 0426 11/03/21 0630  TROPONINIHS 43* 82*     Cardiac EnzymesNo results for input(s): TROPONINI in the last 168 hours. No results for input(s): TROPIPOC in the last 168 hours.  Chemistry Recent Labs  Lab 11/02/21 2109  NA 139  K 4.4  CL 106  CO2 24  GLUCOSE 120*  BUN 28*  CREATININE 1.29*  CALCIUM 9.6  GFRNONAA 42*  ANIONGAP 9    Recent Labs  Lab 11/02/21 2109  PROT 7.8  ALBUMIN 3.5  AST 27  ALT 21  ALKPHOS 49  BILITOT 1.3*   Hematology Recent Labs  Lab 11/02/21 2109  WBC 9.5  RBC 4.62  HGB 15.8*  HCT 48.4*  MCV 104.8*  MCH 34.2*  MCHC 32.6  RDW 15.8*  PLT 151   BNPNo results for input(s): BNP, PROBNP in the last 168 hours.  DDimer No results for input(s): DDIMER in the last 168 hours.  Radiology/Studies:  DG Chest 2 View  Result Date: 11/03/2021 CLINICAL DATA:  Chest pain. EXAM: CHEST - 2 VIEW COMPARISON:  08/17/2021. FINDINGS: The heart is enlarged. Atherosclerotic calcification of the aorta is noted. Extensive interstitial thickening is present bilaterally. Airspace opacities are noted in the peripheral aspects of the lungs, which is slightly increased on the right from the prior exam. There is mild blunting of the right costophrenic angle and a small pleural effusion can not be excluded. No pneumothorax is seen. IMPRESSION: 1. Cardiomegaly. 2. Diffuse interstitial prominence bilaterally, compatible with known pulmonary fibrosis. Increased airspace disease is noted in the lateral aspects of the right lung, which may represent superimposed edema, infection, or progression of interstitial lung disease. 3. Blunting of the right costophrenic angle, possible small pleural effusion. Electronically Signed   By: Brett Fairy M.D.   On: 11/03/2021 04:33   CT ABDOMEN PELVIS W CONTRAST  Result Date: 11/03/2021 CLINICAL DATA:  Right lower quadrant  pain and abdominal distension EXAM: CT ABDOMEN AND PELVIS WITH CONTRAST TECHNIQUE: Multidetector CT imaging of the abdomen and pelvis was performed using the standard protocol following bolus administration of intravenous contrast. CONTRAST:  48m OMNIPAQUE IOHEXOL 300 MG/ML  SOLN COMPARISON:  None. FINDINGS: Lower chest: Mild fibrotic changes are noted in the bases bilaterally. No acute infiltrate is seen. Hepatobiliary: Fatty infiltration of the liver is noted. Gallbladder is well distended with multiple gallstones within. No ductal dilatation is seen. Pancreas: Unremarkable. No pancreatic ductal dilatation or surrounding inflammatory changes. Spleen: Normal in size without focal abnormality. Adrenals/Urinary Tract: The adrenal glands are within normal limits. Kidneys demonstrate a normal enhancement pattern bilaterally. No obstructive changes are seen. A few small cysts are noted within the left kidney. No obstructive changes are seen. The bladder is partially distended. Stomach/Bowel: Mild diverticular change of the colon is noted without evidence of diverticulitis. The appendix is air-filled and within normal limits. Small bowel and stomach are unremarkable. Vascular/Lymphatic: Aortic atherosclerosis. No enlarged abdominal or pelvic lymph nodes. Reproductive: Multiple calcified uterine fibroids are noted. No adnexal mass is noted. Other: No abdominal wall hernia or abnormality. No abdominopelvic ascites. Musculoskeletal: No acute or significant osseous findings. Degenerative anterolisthesis of L4 on L5 is noted. IMPRESSION: Fatty liver and findings of cholelithiasis. No complicating factors are noted. Normal-appearing appendix. Diverticulosis without diverticulitis. Multiple uterine fibroids. Electronically Signed   By: MInez CatalinaM.D.   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  She is volume overloaded on exam.  Chest x-ray shows interstitial markings consistent with her known ILD.  I suspect there is some volume as well. -She has 1+ pitting edema up to her knees.  She had elevated JVD. -She has known cor pulmonale.  Her main issue appears to be more right heart failure than left.  Recent right heart catheterization for him this summer demonstrated an elevated wedge pressure but she also had a transpulmonary gradient of 19.  Her [place] unit by my calculation her 6.3 WU.  This is consistent with mixed group 2 and 3 pulm hypertension. -To me her symptoms are mainly abdominal pain.  I suspect this  is just volume overload in the setting of worsening right heart failure.  She had marginal hemodynamics in July.  She does not appear to be in shock.  BP is quite stable. -Labs are notable for AKI which I suspect is cardiorenal. -Overall I really just feel this is volume overload with worsening right heart failure.  Her chest pain is very atypical and more consistent with abdominal bloating in the setting of right heart failure.  I would like to check a BNP.  Suspect this will be elevated.  We will also repeat an echocardiogram. -Her cardiac enzymes are elevated but I suspect this is demand.  Again we will check an echocardiogram.  I would like to transition her off Eliquis and start a heparin drip.  She may ultimately end up needing a repeat left and right heart catheterization but to me this would be for heart failure optimization.  I think she does need to have CAD ruled out and we may pursue left heart cath this admission.  If her creatinine continues to rise and she does not respond to diuresis she will need a right heart cath anyway.  Again we will follow her closely for need for repeat invasive assessment. -For now we will diurese her.  We will start her on 80 mg IV Lasix twice daily.  Strict I's and O's.  We will  start her on 40 mEq potassium to take with her Lasix. -No signs of shock.  Okay to continue metoprolol succinate 12.5 mg daily.  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  -History of submassive PE.  No PE on repeat scan earlier this year.  VQ scan negative for chronic thromboembolic pulm hypertension. -Interstitial lung disease clearly playing a role.  Also elevated wedge pressure in the past.  Appears to be a mixed picture. -We will see how she does with diuresis.  May need to consider pulmonary consultation if her condition does not improve.  However, to me this is more right heart failure symptoms.  #Paroxysmal atrial fibrillation -No recurrence of A. fib.  Has had this in the past.  Transition to heparin drip as she may need left and right heart catheterization this admission depending how she does. -Continue metoprolol succinate 12.5 mg daily.  No signs of cardiogenic shock.  She is hemodynamically stable.  #AKI -Creatinine is elevated from prior values.  Suspect this is cardiorenal.  She did receive IV contrast for her CT scan.  We will need to monitor this closely.  She is very tenuous given her severe pulm hypertension and cor pulmonale.` -Close monitoring of kidney function with aggressive diuresis.  We will need to watch potassium as well. -Clearly AKI we will delay any attempt at left heart catheterization.  Again we are pursuing a heparin drip just in case she needs this.  If she improves with diuresis she may be able to avoid the contrast load with invasive coronary angiography.  For questions or updates, please contact [place] Please consult [email or web address] for contact info under   Signed, [place] T. Audie Box, MD, [place]  11/03/2021 10:40 AM

## 2021-11-03 NOTE — Plan of Care (Signed)

## 2021-11-03 NOTE — ED Notes (Signed)
Patient transported to CT 

## 2021-11-03 NOTE — Progress Notes (Signed)
ANTICOAGULATION CONSULT NOTE  Pharmacy Consult for heparin Indication: chest pain/ACS  Allergies  Allergen Reactions   Lisinopril     Other reaction(s): cough 09/20/17   Losartan Other (See Comments)    Hallucinations     Patient Measurements: Height: 5' 6" (167.6 cm) Weight: 76 kg (167 lb 8.8 oz) IBW/kg (Calculated) : 59.3 Heparin Dosing Weight: 76kg  Vital Signs: Temp: 98.4 F (36.9 C) (11/24 1926) Temp Source: Oral (11/24 1926) BP: 116/62 (11/24 1926) Pulse Rate: 75 (11/24 1926)  Labs: Recent Labs    11/02/21 2109 11/03/21 0426 11/03/21 0630 11/03/21 1139 11/03/21 1404 11/03/21 2004  HGB 15.8*  --   --   --   --   --   HCT 48.4*  --   --   --   --   --   PLT 151  --   --   --   --   --   APTT  --   --   --   --   --  50*  HEPARINUNFRC  --   --   --   --   --  0.98*  CREATININE 1.29*  --   --   --   --   --   TROPONINIHS  --    < > 82* 75* 57*  --    < > = values in this interval not displayed.     Estimated Creatinine Clearance: 36.8 mL/min (A) (by C-G formula based on SCr of 1.29 mg/dL (H)).   Medical History: Past Medical History:  Diagnosis Date   Acute on chronic diastolic (congestive) heart failure ([place]) 05/19/2021   Diabetes mellitus without complication (HCC)    Hyperlipidemia    Hypertension    Hypothyroidism    IPF (idiopathic pulmonary fibrosis) (HCC)    Thrombocytopenia (HCC)     Assessment: 80 yoF on apixaban PTA for hx PE with concern for ACS. Apixaban to transition to IV heparin for ischemic workup. Last apixaban dose was PTA 11/23 ~0800.   aPTT is subtherapeutic at 50 on 1000 units/hr. Heparin level has not yet started correlating with aPTT. Will adjust rate based on aPTT.    Goal of Therapy:  Heparin level 0.3-0.7 units/ml aPTT 66-102 seconds Monitor platelets by anticoagulation protocol: Yes   Plan:  Increase heparin rate to 1100 units/h  Check heparin level and aPTT in 8h Monitor CBC and s/sx of bleeding daily.  Joseph Art, Pharm.D. PGY-1 Pharmacy Resident [phone] 11/03/2021 9:48 PM

## 2021-11-03 NOTE — ED Provider Notes (Signed)
[facility] EMERGENCY DEPARTMENT Provider Note   CSN: [id number] Arrival date & time: 11/02/21  1834     History Chief Complaint  Patient presents with   Abdominal Pain    Elizabeth Melton is a 80 y.o. female.  The history is provided by the patient and medical records.  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 fib on Eliquis, presenting to the ED with abdominal pain.  States for the past 3 days she has had some ongoing abdominal discomfort.  States her abdomen feels "full" and "bloated".  She has been having sporadic bowel movements but they are smaller than usual, some are firm but others are soft.  She has had some nausea but denies vomiting.  She is not had any fever.  Does report urinary frequency that has been ongoing for several weeks, has had UA checked a few times with PCP but negative.  She was told her frequency was due her to her diabetes.  She has not had any flank pain.  Has had prior tubal ligation but no other abdominal surgeries.  Past Medical History:  Diagnosis Date   Acute on chronic diastolic (congestive) heart failure ([place]) 05/19/2021   Diabetes mellitus without complication (HCC)    Hyperlipidemia    Hypertension    Hypothyroidism    IPF (idiopathic pulmonary fibrosis) ([place])    Thrombocytopenia ([place])     Patient Active Problem List   Diagnosis Date Noted   Acute on chronic respiratory failure with hypoxemia ([place]) 07/14/2021   Chronic diastolic heart failure ([place]) 07/05/2021   Pulmonary hypertension ([place])    Acute on chronic systolic (congestive) heart failure ([place]) 05/19/2021   Acute on chronic respiratory failure with hypoxia ([place]) 05/19/2021   SOB (shortness of breath) 05/19/2021   AF (paroxysmal atrial fibrillation) ([place]) 05/19/2021   Acute on chronic congestive heart failure (HCC)    Atrial  fibrillation with RVR ([place])    Demand ischemia ([place])    Pneumonia due to COVID-19 virus 01/16/2021   Acute respiratory failure with hypoxia ([place]) 09/06/2020   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   ILD (interstitial lung disease) ([place]) 10/23/2019   Chronic respiratory failure with hypoxia ([place]) 10/23/2019   Essential (primary) hypertension 09/18/2018   PAD (peripheral artery disease) ([place]) 09/18/2018   Tobacco abuse 09/18/2018   Aortic regurgitation 09/18/2018   Hypothyroidism    Hyperlipidemia    Diabetes mellitus without complication ([place])    Lower extremity pain 09/24/2012    Past Surgical History:  Procedure Laterality Date   RIGHT HEART CATH N/A 07/05/2021   Procedure: RIGHT HEART CATH;  Surgeon: [name], MD;  Location: [facility];  Service: Cardiovascular;  Laterality: N/A;   TUBAL LIGATION       OB History   No obstetric history on file.     Family History  Problem Relation Age of Onset   Diabetes Mother    Kidney disease Mother    Hypertension Mother    Other Father        tuberculosis   Hypertension Sister    Hyperlipidemia Sister     Social History   Tobacco Use   Smoking status: Former    Packs/day: 1.00    Years: 60.00    Pack years: 60.00    Types: Cigarettes    Start date: 12/11/1958   Smokeless tobacco: Former    Quit  date: 09/24/1962   Tobacco comments:    Smokes occ about 3 cigs per day as of 08/09/21  Vaping Use   Vaping Use: Never used  Substance Use Topics   Alcohol use: Yes   Drug use: No    Home Medications Prior to Admission medications   Medication Sig Start Date End Date Taking? Authorizing Provider  acetaminophen (TYLENOL) 500 MG tablet Take 500-1,000 mg by mouth every 8 (eight) hours as needed for headache or mild pain (pain).    [provider]  apixaban (ELIQUIS) 5 MG TABS tablet Take 1 tablet (5 mg total) by mouth 2 (two) times daily. 07/25/21   [name], NP  b complex vitamins  tablet Take 1 tablet by mouth daily.    [provider]  Blood Glucose Monitoring Suppl (TRUE METRIX METER) w/Device KIT 1 each by Other route in the morning and at bedtime. 12/25/20   [provider]  buPROPion (WELLBUTRIN XL) 150 MG 24 hr tablet Take 150 mg by mouth daily.    [provider]  Calcium Carb-Cholecalciferol (CALCIUM + D3) 600-200 MG-UNIT TABS Take 2 tablets by mouth daily.     [provider]  cholecalciferol (VITAMIN D3) 25 MCG (1000 UT) tablet Take 1,000 Units by mouth daily.    [provider]  furosemide (LASIX) 40 MG tablet Take 1 tablet (40 mg total) by mouth daily. 07/27/21 10/25/21  [name], MD  glucose blood test strip 1 each by Other route as needed for other (blood sugar).    [provider]  insulin aspart (NOVOLOG) 100 UNIT/ML FlexPen Insulin sliding scale: Blood sugar  120-150   3units                       151-200   4units                       201-250   7units                       251- 300  11units                       301-350   15uints                       351-400   20units                       >400         call MD immediately 05/22/21   [name], MD  insulin glargine (LANTUS) 100 UNIT/ML injection Inject 10 Units into the skin daily. Patient not taking: Reported on 09/20/2021    [provider]  insulin glargine (LANTUS) 100 UNIT/ML Solostar Pen Inject 10 Units into the skin daily for 10 days. Patient not taking: Reported on 09/20/2021 05/22/21 07/14/21  [name], MD  Insulin Pen Needle (PEN NEEDLES 29GX1/2") 29G X 12MM MISC For insulin injection 05/22/21   [name], MD  levothyroxine (SYNTHROID, LEVOTHROID) 75 MCG tablet Take 75 mcg by mouth daily before breakfast.     [provider]  losartan (COZAAR) 25 MG tablet Take 12.5 mg by mouth daily. 06/15/21   [provider]  lovastatin (MEVACOR) 40 MG tablet Take 40 mg by mouth at bedtime.    [provider]   metoprolol succinate (TOPROL-XL) 25 MG 24  hr tablet Take 0.5 tablets (12.5 mg total) by mouth daily. 01/28/21   [name], MD  nicotine (NICODERM CQ - DOSED IN MG/24 HOURS) 14 mg/24hr patch Place 21 mg onto the skin daily.    [provider]  Omega-3 Fatty Acids (FISH OIL PO) Take 1,000 mg by mouth daily.    [provider]  OXYGEN Inhale 3 L into the lungs continuous.    [provider]  potassium chloride (KLOR-CON) 10 MEQ tablet TAKE 1 TABLET EVERY DAY 07/27/21   [name], MD  predniSONE (DELTASONE) 10 MG tablet Take 2 tab 20 mg daily for 7 days, then take 10 mg daily. Patient not taking: Reported on 09/20/2021 06/28/21   [name], [name], MD  senna-docusate (SENOKOT-S) 8.6-50 MG tablet Take 1 tablet by mouth at bedtime as needed for mild constipation. Patient not taking: Reported on 09/20/2021 05/22/21   [name], MD  SMART SENSE THIN LANCETS 26G MISC 1 each by Does not apply route 2 (two) times daily.    [provider]  Tiotropium Bromide-Olodaterol (STIOLTO RESPIMAT) 2.5-2.5 MCG/ACT AERS Inhale 2 puffs into the lungs daily. Patient not taking: Reported on 09/20/2021 08/09/21   Brand Males, MD    Allergies    Lisinopril and Losartan  Review of Systems   Review of Systems  Gastrointestinal:  Positive for abdominal pain.  All other systems reviewed and are negative.  Physical Exam Updated Vital Signs BP (!) 163/67   Pulse 77   Temp 99 F (37.2 C) (Oral)   Resp 20   Ht _0  (1.676 m)   Wt 75.8 kg   SpO2 99%   BMI 26.95 kg/m   Physical Exam Vitals and nursing note reviewed.  Constitutional:      Appearance: She is well-developed.  HENT:     Head: Normocephalic and atraumatic.  Eyes:     Conjunctiva/sclera: Conjunctivae normal.     Pupils: Pupils are equal, round, and reactive to light.  Cardiovascular:     Rate and Rhythm: Normal rate and regular rhythm.     Heart sounds: Normal heart sounds.  Pulmonary:      Effort: Pulmonary effort is normal.     Breath sounds: Normal breath sounds.  Abdominal:     General: Bowel sounds are normal.     Palpations: Abdomen is soft.     Tenderness: There is no abdominal tenderness. There is no rebound.  Musculoskeletal:        General: Normal range of motion.     Cervical back: Normal range of motion.  Skin:    General: Skin is warm and dry.  Neurological:     Mental Status: She is alert and oriented to person, place, and time.    ED Results / Procedures / Treatments   Labs (all labs ordered are listed, but only abnormal results are displayed) Labs Reviewed  CBC WITH DIFFERENTIAL/PLATELET - Abnormal; Notable for the following components:      Result Value   Hemoglobin 15.8 (*)    HCT 48.4 (*)    MCV 104.8 (*)    MCH 34.2 (*)    RDW 15.8 (*)    nRBC 2.0 (*)    All other components within normal limits  COMPREHENSIVE METABOLIC PANEL - Abnormal; Notable for the following components:   Glucose, Bld 120 (*)    BUN 28 (*)    Creatinine, Ser 1.29 (*)    Total Bilirubin 1.3 (*)    GFR, Estimated  42 (*)    All other components within normal limits  URINALYSIS, ROUTINE W REFLEX MICROSCOPIC - Abnormal; Notable for the following components:   Protein, ur 30 (*)    All other components within normal limits  TROPONIN I (HIGH SENSITIVITY) - Abnormal; Notable for the following components:   Troponin I (High Sensitivity) 43 (*)    All other components within normal limits  LIPASE, BLOOD    EKG None  Radiology DG Chest 2 View  Result Date: 11/03/2021 CLINICAL DATA:  Chest pain. EXAM: CHEST - 2 VIEW COMPARISON:  08/17/2021. FINDINGS: The heart is enlarged. Atherosclerotic calcification of the aorta is noted. Extensive interstitial thickening is present bilaterally. Airspace opacities are noted in the peripheral aspects of the lungs, which is slightly increased on the right from the prior exam. There is mild blunting of the right costophrenic angle and  a small pleural effusion can not be excluded. No pneumothorax is seen. IMPRESSION: 1. Cardiomegaly. 2. Diffuse interstitial prominence bilaterally, compatible with known pulmonary fibrosis. Increased airspace disease is noted in the lateral aspects of the right lung, which may represent superimposed edema, infection, or progression of interstitial lung disease. 3. Blunting of the right costophrenic angle, possible small pleural effusion. Electronically Signed   By: Brett Fairy M.D.   On: 11/03/2021 04:33   CT ABDOMEN PELVIS W CONTRAST  Result Date: 11/03/2021 CLINICAL DATA:  Right lower quadrant pain and abdominal distension EXAM: CT ABDOMEN AND PELVIS WITH CONTRAST TECHNIQUE: Multidetector CT imaging of the abdomen and pelvis was performed using the standard protocol following bolus administration of intravenous contrast. CONTRAST:  16m OMNIPAQUE IOHEXOL 300 MG/ML  SOLN COMPARISON:  None. FINDINGS: Lower chest: Mild fibrotic changes are noted in the bases bilaterally. No acute infiltrate is seen. Hepatobiliary: Fatty infiltration of the liver is noted. Gallbladder is well distended with multiple gallstones within. No ductal dilatation is seen. Pancreas: Unremarkable. No pancreatic ductal dilatation or surrounding inflammatory changes. Spleen: Normal in size without focal abnormality. Adrenals/Urinary Tract: The adrenal glands are within normal limits. Kidneys demonstrate a normal enhancement pattern bilaterally. No obstructive changes are seen. A few small cysts are noted within the left kidney. No obstructive changes are seen. The bladder is partially distended. Stomach/Bowel: Mild diverticular change of the colon is noted without evidence of diverticulitis. The appendix is air-filled and within normal limits. Small bowel and stomach are unremarkable. Vascular/Lymphatic: Aortic atherosclerosis. No enlarged abdominal or pelvic lymph nodes. Reproductive: Multiple calcified uterine fibroids are noted. No  adnexal mass is noted. Other: No abdominal wall hernia or abnormality. No abdominopelvic ascites. Musculoskeletal: No acute or significant osseous findings. Degenerative anterolisthesis of L4 on L5 is noted. IMPRESSION: Fatty liver and findings of cholelithiasis. No complicating factors are noted. Normal-appearing appendix. Diverticulosis without diverticulitis. Multiple uterine fibroids. Electronically Signed   By: MInez CatalinaM.D.   On: 11/03/2021 03:11    Procedures Procedures   Medications Ordered in ED Medications  iohexol (OMNIPAQUE) 300 MG/ML solution 80 mL (80 mLs Intravenous Contrast Given 11/03/21 0302)    ED Course  I have reviewed the triage vital signs and the nursing notes.  Pertinent labs & imaging results that were available during my care of the patient were reviewed by me and considered in my medical decision making (see chart for details).    MDM Rules/Calculators/A&P                           80year old female presenting to  the ED with abdominal pain.  States she feels "full" and "bloated".  Has been having BM's but smaller than normal.  She is afebrile, non-toxic.  Abdomen soft, non-tender.  Normal bowel sounds throughout.  Labs reassuring.  CT obtained-- gallstones without other acute findings.    Attending physician, Dr. Wyvonnia Dusky, has evaluated patient-- she now reports having chest pain recently.  She denies this to me initially.  Denies active chest pain at present.  EKG non-ischemic.  Will obtain troponin and CXR.  CXR with findings of pulmonary fibrosis which is chronic.  She remains on her baseline home O2.  Trop 43.  Will obtain delta to ensure no significant change.  Care will be signed out to oncoming team-- if troponin remains flat/decreasing, anticipate discharge with PCP follow-up.  Will need admission if increasing significantly.  Final Clinical Impression(s) / ED Diagnoses Final diagnoses:  Generalized abdominal pain    Rx / DC Orders ED  Discharge Orders     None        Larene Pickett, PA-C 11/03/21 0617    Ezequiel Essex, MD 11/03/21 [phone]

## 2021-11-04 ENCOUNTER — Observation Stay (HOSPITAL_COMMUNITY): Payer: Medicare HMO

## 2021-11-04 DIAGNOSIS — I480 Paroxysmal atrial fibrillation: Secondary | ICD-10-CM | POA: Diagnosis present

## 2021-11-04 DIAGNOSIS — I5031 Acute diastolic (congestive) heart failure: Secondary | ICD-10-CM

## 2021-11-04 DIAGNOSIS — F17210 Nicotine dependence, cigarettes, uncomplicated: Secondary | ICD-10-CM | POA: Diagnosis present

## 2021-11-04 DIAGNOSIS — R778 Other specified abnormalities of plasma proteins: Secondary | ICD-10-CM | POA: Diagnosis not present

## 2021-11-04 DIAGNOSIS — Z79899 Other long term (current) drug therapy: Secondary | ICD-10-CM | POA: Diagnosis not present

## 2021-11-04 DIAGNOSIS — Z8249 Family history of ischemic heart disease and other diseases of the circulatory system: Secondary | ICD-10-CM | POA: Diagnosis not present

## 2021-11-04 DIAGNOSIS — I110 Hypertensive heart disease with heart failure: Secondary | ICD-10-CM | POA: Diagnosis present

## 2021-11-04 DIAGNOSIS — K760 Fatty (change of) liver, not elsewhere classified: Secondary | ICD-10-CM | POA: Diagnosis present

## 2021-11-04 DIAGNOSIS — Z28310 Unvaccinated for covid-19: Secondary | ICD-10-CM | POA: Diagnosis not present

## 2021-11-04 DIAGNOSIS — I50811 Acute right heart failure: Secondary | ICD-10-CM | POA: Diagnosis not present

## 2021-11-04 DIAGNOSIS — F39 Unspecified mood [affective] disorder: Secondary | ICD-10-CM | POA: Diagnosis present

## 2021-11-04 DIAGNOSIS — J849 Interstitial pulmonary disease, unspecified: Secondary | ICD-10-CM | POA: Diagnosis not present

## 2021-11-04 DIAGNOSIS — E039 Hypothyroidism, unspecified: Secondary | ICD-10-CM | POA: Diagnosis present

## 2021-11-04 DIAGNOSIS — E785 Hyperlipidemia, unspecified: Secondary | ICD-10-CM | POA: Diagnosis not present

## 2021-11-04 DIAGNOSIS — I5033 Acute on chronic diastolic (congestive) heart failure: Secondary | ICD-10-CM | POA: Diagnosis present

## 2021-11-04 DIAGNOSIS — Z833 Family history of diabetes mellitus: Secondary | ICD-10-CM | POA: Diagnosis not present

## 2021-11-04 DIAGNOSIS — I50810 Right heart failure, unspecified: Secondary | ICD-10-CM | POA: Diagnosis present

## 2021-11-04 DIAGNOSIS — I2781 Cor pulmonale (chronic): Secondary | ICD-10-CM | POA: Diagnosis present

## 2021-11-04 DIAGNOSIS — I248 Other forms of acute ischemic heart disease: Secondary | ICD-10-CM | POA: Diagnosis present

## 2021-11-04 DIAGNOSIS — I2729 Other secondary pulmonary hypertension: Secondary | ICD-10-CM | POA: Diagnosis present

## 2021-11-04 DIAGNOSIS — I5032 Chronic diastolic (congestive) heart failure: Secondary | ICD-10-CM | POA: Diagnosis not present

## 2021-11-04 DIAGNOSIS — Z7901 Long term (current) use of anticoagulants: Secondary | ICD-10-CM | POA: Diagnosis not present

## 2021-11-04 DIAGNOSIS — N179 Acute kidney failure, unspecified: Secondary | ICD-10-CM | POA: Diagnosis present

## 2021-11-04 DIAGNOSIS — Z8616 Personal history of COVID-19: Secondary | ICD-10-CM | POA: Diagnosis not present

## 2021-11-04 DIAGNOSIS — J84112 Idiopathic pulmonary fibrosis: Secondary | ICD-10-CM | POA: Diagnosis present

## 2021-11-04 DIAGNOSIS — R1084 Generalized abdominal pain: Secondary | ICD-10-CM | POA: Diagnosis present

## 2021-11-04 DIAGNOSIS — Z20822 Contact with and (suspected) exposure to covid-19: Secondary | ICD-10-CM | POA: Diagnosis present

## 2021-11-04 DIAGNOSIS — I700 Atherosclerosis of aorta: Secondary | ICD-10-CM | POA: Diagnosis present

## 2021-11-04 DIAGNOSIS — K5900 Constipation, unspecified: Secondary | ICD-10-CM | POA: Diagnosis present

## 2021-11-04 DIAGNOSIS — I10 Essential (primary) hypertension: Secondary | ICD-10-CM | POA: Diagnosis not present

## 2021-11-04 DIAGNOSIS — E119 Type 2 diabetes mellitus without complications: Secondary | ICD-10-CM | POA: Diagnosis present

## 2021-11-04 LAB — GLUCOSE, CAPILLARY
Glucose-Capillary: 155 mg/dL — ABNORMAL HIGH (ref 70–99)
Glucose-Capillary: 120 mg/dL — ABNORMAL HIGH (ref 70–99)
Glucose-Capillary: 270 mg/dL — ABNORMAL HIGH (ref 70–99)
Glucose-Capillary: 95 mg/dL (ref 70–99)

## 2021-11-04 LAB — BASIC METABOLIC PANEL
Anion gap: 11 (ref 5–15)
BUN: 34 mg/dL — ABNORMAL HIGH (ref 8–23)
CO2: 22 mmol/L (ref 22–32)
Calcium: 8.9 mg/dL (ref 8.9–10.3)
Chloride: 102 mmol/L (ref 98–111)
Creatinine, Ser: 1.51 mg/dL — ABNORMAL HIGH (ref 0.44–1.00)
GFR, Estimated: 35 mL/min — ABNORMAL LOW (ref >60)
Glucose, Bld: 198 mg/dL — ABNORMAL HIGH (ref 70–99)
Potassium: 4.5 mmol/L (ref 3.5–5.1)
Sodium: 135 mmol/L (ref 135–145)

## 2021-11-04 LAB — LIPID PANEL
Cholesterol: 114 mg/dL (ref 0–200)
HDL: 46 mg/dL (ref >40)
LDL Cholesterol: 62 mg/dL (ref 0–99)
Total CHOL/HDL Ratio: 2.5 RATIO
Triglycerides: 31 mg/dL (ref <150)
VLDL: 6 mg/dL (ref 0–40)

## 2021-11-04 LAB — ECHOCARDIOGRAM COMPLETE
Area-P 1/2: 2.83 cm2
Height: 66 in
MV M vel: 5 m/s
MV Peak grad: 100 mmHg
P 1/2 time: 413 msec
S' Lateral: 2.9 cm
Weight: 2680.79 oz

## 2021-11-04 LAB — HEPARIN LEVEL (UNFRACTIONATED): Heparin Unfractionated: 0.97 IU/mL — ABNORMAL HIGH (ref 0.30–0.70)

## 2021-11-04 LAB — CBC
HCT: 44.7 % (ref 36.0–46.0)
Hemoglobin: 14.9 g/dL (ref 12.0–15.0)
MCH: 34.7 pg — ABNORMAL HIGH (ref 26.0–34.0)
MCHC: 33.3 g/dL (ref 30.0–36.0)
MCV: 104.2 fL — ABNORMAL HIGH (ref 80.0–100.0)
Platelets: 128 10*3/uL — ABNORMAL LOW (ref 150–400)
RBC: 4.29 MIL/uL (ref 3.87–5.11)
RDW: 16.3 % — ABNORMAL HIGH (ref 11.5–15.5)
WBC: 8.9 10*3/uL (ref 4.0–10.5)
nRBC: 4.7 % — ABNORMAL HIGH (ref 0.0–0.2)

## 2021-11-04 LAB — PATHOLOGIST SMEAR REVIEW

## 2021-11-04 LAB — APTT
aPTT: 58 seconds — ABNORMAL HIGH (ref 24–36)
aPTT: 69 seconds — ABNORMAL HIGH (ref 24–36)

## 2021-11-04 LAB — MAGNESIUM: Magnesium: 2.4 mg/dL (ref 1.7–2.4)

## 2021-11-04 MED ORDER — FUROSEMIDE 10 MG/ML IJ SOLN
80.0000 mg | Freq: Every day | INTRAMUSCULAR | Status: DC
  Administered 2021-11-05: 80 mg via INTRAVENOUS
  Filled 2021-11-04: qty 8

## 2021-11-04 MED ORDER — POLYETHYLENE GLYCOL 3350 17 G PO PACK
17.0000 g | PACK | Freq: Every day | ORAL | Status: DC
  Administered 2021-11-05 – 2021-11-06 (×2): 17 g via ORAL
  Filled 2021-11-04 (×2): qty 1

## 2021-11-04 NOTE — Discharge Instructions (Signed)
Medicare Outpatient Observation Notice   Patient name:  Elizabeth Melton Patient number:  [id number]                                                                                                                                                                       You're a hospital outpatient receiving observation services. You are not an inpatient because:    CHF   You require hospital care for evaluation and/or treatment.  It is expected you will need hospital care for less than a total of two days.                                                                                                                                                                         Being an outpatient may affect what you pay in a hospital:   When you're a hospital outpatient, your observation stay is covered under Medicare Part B.   For Part B services, you generally pay:   A copayment for each outpatient hospital service you get. Part B copayments may vary by type of service.   20% of the Medicare-approved amount for most doctor services, after the Part B deductible.   Observation services may affect coverage and payment of your care after you leave the hospital:     If you need skilled nursing facility (SNF) care after you leave the hospital, Medicare Part A will only cover SNF care if you've had a 3-day minimum, medically necessary, inpatient hospital stay for a related illness or injury. An inpatient hospital stay begins the day the hospital admits you as an inpatient based on a doctor's order and doesn't include the day you're discharged.   If you have Medicaid, a Medicare Advantage plan or other health plan, Medicaid or the plan may have different rules for SNF coverage after you leave the hospital. Check with Medicaid or your plan.   NOTE: Medicare Part A generally doesn't cover outpatient hospital services, like  an observation stay. However, Part A will generally cover medically necessary  inpatient services if the hospital admits you as an inpatient based on a doctor's order. In most cases, you'll pay a one-time deductible for all of your inpatient hospital services for the first 60 days you're in a hospital.                                                                                                                                                                      If you have any questions about your observation services, ask the hospital staff member giving you this notice or the doctor providing your hospital care. You can also ask to speak with someone from the hospital's utilization or discharge planning department.   You can also call 1-800-MEDICARE (1-5106684508).  TTY users should call [phone].   Form CMS 67124-PYKD   Expiration 12/10/2021 [employer] APPROVAL [id number]          Your costs for medications:     Generally, prescription and over-the-counter drugs, including "self-administered drugs," you get in a hospital outpatient setting (like an emergency department) aren't covered by Part B. "Self- administered drugs" are drugs you'd normally take on your own. For safety reasons, many hospitals don't allow you to take medications brought from home. If you have a Medicare prescription drug plan (Part D), your plan may help you pay for these drugs. You'll likely need to pay out-of- pocket for these drugs and submit a claim to your drug plan for a refund. Contact your drug plan for more information.                                                                                                                                                                        If you're enrolled in a Medicare Advantage plan (like an HMO or PPO) or other Medicare health plan (Part C), your costs and coverage may be different. Check with your plan to find out about coverage for  outpatient observation services.    If you're a Qualified Medicare Beneficiary through your state  Medicaid program, you can't be billed for Part A or Part B deductibles, coinsurance, and copayments.                                                                                                                                                                      Additional Information (Optional):                                                                                                                                                                                Please sign below to show you received and understand this notice.                                         Date: 11/04/21 / Time:10:33 AM   CMS does not discriminate in its programs and activities. To request this publication in alternative format, please call: 1-800-MEDICARE or email:[email or web address].   Form CMS 06237-[id number]   Expiration 12/10/2021 [employer] APPROVAL [id number]

## 2021-11-04 NOTE — Progress Notes (Signed)
  Echocardiogram 2D Echocardiogram has been performed.  Elizabeth Melton 11/04/2021, 10:23 AM

## 2021-11-04 NOTE — Evaluation (Signed)
Physical Therapy Evaluation Patient Details Name: Elizabeth Melton MRN: [id number] DOB: May 04, 1941 Today's Date: 11/04/2021  History of Present Illness  Elizabeth Melton is a 80 y.o. 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 Found to have elevated troponins and constipation.  Clinical Impression  Patient presents with decreased mobility due to decreased activity tolerance and decreased cardiopulmonary endurance requiring 4L O2 with ambulation needing seated rest x 3 in hallway due to SOB and hypoxia.  Patient reports this is not too far from her baseline.  She does live in second floor apartment so needs to be able to negotiate stairs.  PT will continue to follow acutely, but pt likely will not need follow up PT at d/c.      Recommendations for follow up therapy are one component of a multi-disciplinary discharge planning process, led by the attending physician.  Recommendations may be updated based on patient status, additional functional criteria and insurance authorization.  Follow Up Recommendations No PT follow up    Assistance Recommended at Discharge Set up Supervision/Assistance  Functional Status Assessment    Equipment Recommendations  None recommended by PT    Recommendations for Other Services       Precautions / Restrictions Precautions Precautions: Fall Precaution Comments: O2 dependent      Mobility  Bed Mobility Overal bed mobility: Needs Assistance Bed Mobility: Sit to Supine       Sit to supine: Min guard;HOB elevated   General bed mobility comments: assist for lines, getting leg all the way on bed    Transfers Overall transfer level: Needs assistance Equipment  used: Rollator (4 wheels) Transfers: Sit to/from Stand Sit to Stand: Supervision           General transfer comment: for lines    Ambulation/Gait Ambulation/Gait assistance: Supervision;Min guard Gait Distance (Feet): 75 Feet (x 4) Assistive device: Rollator (4 wheels) Gait Pattern/deviations: Step-through pattern;Decreased stride length       General Gait Details: sat on walker x 3 walking in hallway to rest due to SOB and SpO2 drops to 81% on 4L O2 and needs to rest to return to 90's.  Stairs            Wheelchair Mobility    Modified Rankin (Stroke Patients Only)       Balance Overall balance assessment: Needs assistance   Sitting balance-Leahy Scale: Good       Standing balance-Leahy Scale: Fair                               Pertinent Vitals/Pain Pain Assessment: Faces Faces Pain Scale: Hurts little more Pain Location: abdomen Pain Descriptors / Indicators: Aching Pain Intervention(s): Monitored during session    Home Living Family/patient expects to be discharged to:: Private residence Living Arrangements: Children Available Help at Discharge: Family;Available PRN/intermittently Type of Home: House Home Access: Stairs to enter Entrance Stairs-Rails: Right Entrance Stairs-Number of Steps: 15   Home Layout: One level Home Equipment: Rollator (4 wheels);Cane - single point      Prior Function Prior Level of Function : Needs assist       Physical Assist : Mobility (physical);ADLs (physical)     Mobility Comments: son  assists carrying oxygen when she goes up steps ADLs Comments: has help for cooking/cleaning     Hand Dominance        Extremity/Trunk Assessment   Upper Extremity Assessment Upper Extremity Assessment: Overall WFL for tasks assessed    Lower Extremity Assessment Lower Extremity Assessment: Overall WFL for tasks assessed    Cervical / Trunk Assessment Cervical / Trunk Assessment: Kyphotic   Communication   Communication: No difficulties  Cognition Arousal/Alertness: Awake/alert Behavior During Therapy: WFL for tasks assessed/performed Overall Cognitive Status: Within Functional Limits for tasks assessed                                          General Comments      Exercises     Assessment/Plan    PT Assessment Patient needs continued PT services  PT Problem List Decreased activity tolerance;Cardiopulmonary status limiting activity;Decreased mobility       PT Treatment Interventions Gait training;Stair training;Balance training;Functional mobility training;Patient/family education;Therapeutic activities    PT Goals (Current goals can be found in the Care Plan section)  Acute Rehab PT Goals Patient Stated Goal: to go home PT Goal Formulation: With patient Time For Goal Achievement: 11/18/21 Potential to Achieve Goals: Good    Frequency Min 3X/week   Barriers to discharge        Co-evaluation               AM-PAC PT "6 Clicks" Mobility  Outcome Measure Help needed turning from your back to your side while in a flat bed without using bedrails?: None Help needed moving from lying on your back to sitting on the side of a flat bed without using bedrails?: A Little Help needed moving to and from a bed to a chair (including a wheelchair)?: A Little Help needed standing up from a chair using your arms (e.g., wheelchair or bedside chair)?: None Help needed to walk in hospital room?: A Little Help needed climbing 3-5 steps with a railing? : A Little 6 Click Score: 20    End of Session   Activity Tolerance: Treatment limited secondary to medical complications (Comment) (hypoxia on 4L O2 with ambulation) Patient left: in bed;with call bell/phone within reach   PT Visit Diagnosis: Difficulty in walking, not elsewhere classified (R26.2)    Time: 4496-7591 PT Time Calculation (min) (ACUTE ONLY): 41 min   Charges:   PT Evaluation $PT  Eval Moderate Complexity: 1 Mod PT Treatments $Gait Training: 8-22 mins        Magda Kiel, PT Acute Rehabilitation Services Pager:[phone] [phone] 11/04/2021   Reginia Naas 11/04/2021, 5:09 PM

## 2021-11-04 NOTE — Progress Notes (Signed)
PROGRESS NOTE  Elizabeth Melton ZDG:[id number] DOB: [birth year] DOA: 11/02/2021 PCP: Kathyrn Lass, MD   LOS: 0 days   Brief narrative: Elizabeth Melton is a 80 y.o. 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  She was very constipated and has not had a bowel movement for a week.  In the ED patient had elevated troponins.  Cardiology was consulted and patient was admitted hospital for further evaluation and treatment.   Assessment/Plan:  Principal Problem:   Abdominal pain Active Problems:   Hypothyroidism   Hyperlipidemia   Diabetes mellitus without complication ([place])   Essential (primary) hypertension   Tobacco abuse   ILD (interstitial lung disease) (HCC)   AF (paroxysmal atrial fibrillation) (H[facility])   Chronic diastolic heart failure ([facility])   Elevated troponin  Abdominal pain -Has slightly improved after having a bowel movement.  CT scan showing fatty liver cholelithiasis uterine fibroids.  We will continue to monitor.  Continue diuretics for now.  Lipase was 35.   Elevated troponin with intermittent chest pain. Elevated troponins with positive delta.  Possible demand ischemia.  Cardiology on board.; likely associated with demand ischemia.  Check 2D echocardiogram   Interstitial lung disease with severe pulmonary hypertension.   On home oxygen.  Continue IV diuresis.  Follows up with pulmonary as outpatient.  COVID and influenza was negative.   Chronic diastolic CHF 2D echocardiogram on 59/56/38-VFIEP 2 diastolic dysfunction and severe pulmonary HTN.  On IV diuretics.  Repeat echo has been ordered.  Cardiology on board.  BNP was elevated at 631.  DM type II.   Will add A1c.  Was on Lantus for  some time but currently not on it.  Continue sliding scale insulin.   HTN Continue metoprolol succinate.  Monitor blood pressure.   Hyperlipidemia -Continue Mevacor (Pravastatin formulary substitution)   Hypothyroidism Continue Synthroid  H/o PE/Afib history of atrial fibrillation and PE.   Eliquis on hold.  Currently on heparin drip.  Rate controlled with metoprolol.    Mood disorder Continue Wellbutrin   Tobacco dependence Continue patch.  DVT prophylaxis:   Heparin drip   Code Status: Full code  Family Communication: Spoke with the patient at bedside.  Status is: Observation  The patient will require care spanning > 2 midnights and should be moved to inpatient because: Respiratory failure, abdominal pain, elevated troponin.  Cardiology evaluation, IV diuretics.    Consultants: Cardiology  Procedures: None  Anti-infectives:  None  Anti-infectives (From admission, onward)    None      Subjective: Today, patient was seen and examined at bedside.  States that she feels a little better with breathing.  Constipation has improved and had a large bowel movement.  Denies any chest pain.  Objective: Vitals:   11/04/21 0700 11/04/21 0730  BP:  126/62  Pulse:  65  Resp:  17  Temp: 97.8 F (36.6 C) 97.8 F (36.6 C)  SpO2:  95%    Intake/Output Summary (Last 24 hours) at 11/04/2021 1027 Last data filed at 11/04/2021 0900 Gross per 24 hour  Intake 623.52 ml  Output 2150 ml  Net -1526.48 ml   Filed Weights   11/03/21 0229 11/03/21 1115  Weight: 75.8 kg 76 kg   Body mass index is  27.04 kg/m.   Physical Exam: GENERAL: Patient is alert awake and oriented. Not in obvious distress.  On nasal cannula oxygen, HENT: No scleral pallor or icterus. Pupils equally reactive to light. Oral mucosa is moist NECK: is supple, no gross swelling noted. CHEST: Diffuse crackles noted. CVS: S1 and S2 heard, systolic murmur noted. Regular rate and rhythm.  ABDOMEN:  Soft, non-tender, bowel sounds are present.  Distended abdomen. EXTREMITIES: Bilateral lower extremity edema noted CNS: Cranial nerves are intact. No focal motor deficits. SKIN: warm and dry without rashes.  Data Review: I have personally reviewed the following laboratory data and studies,  CBC: Recent Labs  Lab 11/02/21 2109 11/04/21 0116  WBC 9.5 8.9  NEUTROABS 7.0  --   HGB 15.8* 14.9  HCT 48.4* 44.7  MCV 104.8* 104.2*  PLT 151 607*   Basic Metabolic Panel: Recent Labs  Lab 11/02/21 2109 11/04/21 0903  NA 139 135  K 4.4 4.5  CL 106 102  CO2 24 22  GLUCOSE 120* 198*  BUN 28* 34*  CREATININE 1.29* 1.51*  CALCIUM 9.6 8.9  MG  --  2.4   Liver Function Tests: Recent Labs  Lab 11/02/21 2109  AST 27  ALT 21  ALKPHOS 49  BILITOT 1.3*  PROT 7.8  ALBUMIN 3.5   Recent Labs  Lab 11/02/21 2109  LIPASE 35   No results for input(s): AMMONIA in the last 168 hours. Cardiac Enzymes: No results for input(s): CKTOTAL, CKMB, CKMBINDEX, TROPONINI in the last 168 hours. BNP (last 3 results) Recent Labs    07/15/21 0323 09/20/21 1449 11/03/21 1139  BNP 387.8* 233.1* 631.9*    ProBNP (last 3 results) No results for input(s): PROBNP in the last 8760 hours.  CBG: Recent Labs  Lab 11/03/21 1136 11/03/21 1605 11/03/21 2142 11/04/21 0632  GLUCAP 121* 177* 120* 155*   Recent Results (from the past 240 hour(s))  Resp Panel by RT-PCR (Flu A&B, Covid) Nasopharyngeal Swab     Status: None   Collection Time: 11/03/21  9:11 AM   Specimen: Nasopharyngeal Swab; Nasopharyngeal(NP) swabs in vial transport medium  Result Value Ref Range Status   SARS Coronavirus 2 by RT PCR NEGATIVE NEGATIVE Final    Comment: (NOTE) SARS-CoV-2 target nucleic acids are NOT DETECTED.  The SARS-CoV-2 RNA is generally detectable in upper respiratory specimens during the acute phase of infection. The lowest concentration of SARS-CoV-2 viral copies this assay can detect is 138 copies/mL. A  negative result does not preclude SARS-Cov-2 infection and should not be used as the sole basis for treatment or other patient management decisions. A negative result may occur with  improper specimen collection/handling, submission of specimen other than nasopharyngeal swab, presence of viral mutation(s) within the areas targeted by this assay, and inadequate number of viral copies(<138 copies/mL). A negative result must be combined with clinical observations, patient history, and epidemiological information. The expected result is Negative.  Fact Sheet for Patients:  EntrepreneurPulse.com.au  Fact Sheet for Healthcare Providers:  IncredibleEmployment.be  This test is no t yet approved or cleared by the [place] FDA and  has been authorized for detection and/or diagnosis of SARS-CoV-2 by FDA under an Emergency Use Authorization (EUA). This EUA will remain  in effect (meaning this test can be used) for the duration of the COVID-19 declaration under Section 564(b)(1) of the Act, 21 U.S.C.section 360bbb-3(b)(1), unless the authorization is terminated  or revoked sooner.       Influenza A by PCR NEGATIVE NEGATIVE Final  Influenza B by PCR NEGATIVE NEGATIVE Final    Comment: (NOTE) The Xpert Xpress SARS-CoV-2/FLU/RSV plus assay is intended as an aid in the diagnosis of influenza from Nasopharyngeal swab specimens and should not be used as a sole basis for treatment. Nasal washings and aspirates are unacceptable for Xpert Xpress SARS-CoV-2/FLU/RSV testing.  Fact Sheet for Patients: EntrepreneurPulse.com.au  Fact Sheet for Healthcare Providers: IncredibleEmployment.be  This test is not yet approved or cleared by the [place] FDA and has been authorized for detection and/or diagnosis of SARS-CoV-2 by FDA under an Emergency Use Authorization (EUA). This EUA will remain in effect (meaning this test can  be used) for the duration of the COVID-19 declaration under Section 564(b)(1) of the Act, 21 U.S.C. section 360bbb-3(b)(1), unless the authorization is terminated or revoked.  Performed at [facility], [place] [address] [place], [place] [address]   MRSA Next Gen by PCR, Nasal     Status: None   Collection Time: 11/03/21  1:20 PM   Specimen: Nasal Mucosa; Nasal Swab  Result Value Ref Range Status   MRSA by PCR Next Gen NOT DETECTED NOT DETECTED Final    Comment: (NOTE) The GeneXpert MRSA Assay (FDA approved for NASAL specimens only), is one component of a comprehensive MRSA colonization surveillance program. It is not intended to diagnose MRSA infection nor to guide or monitor treatment for MRSA infections. Test performance is not FDA approved in patients less than 52 years old. Performed at [facility], [place] [address] [place], [place] [address]      Studies: DG Chest 2 View  Result Date: 11/03/2021 CLINICAL DATA:  Chest pain. EXAM: CHEST - 2 VIEW COMPARISON:  08/17/2021. FINDINGS: The heart is enlarged. Atherosclerotic calcification of the aorta is noted. Extensive interstitial thickening is present bilaterally. Airspace opacities are noted in the peripheral aspects of the lungs, which is slightly increased on the right from the prior exam. There is mild blunting of the right costophrenic angle and a small pleural effusion can not be excluded. No pneumothorax is seen. IMPRESSION: 1. Cardiomegaly. 2. Diffuse interstitial prominence bilaterally, compatible with known pulmonary fibrosis. Increased airspace disease is noted in the lateral aspects of the right lung, which may represent superimposed edema, infection, or progression of interstitial lung disease. 3. Blunting of the right costophrenic angle, possible small pleural effusion. Electronically Signed   By: Brett Fairy M.D.   On: 11/03/2021 04:33   CT ABDOMEN PELVIS W CONTRAST  Result Date: 11/03/2021 CLINICAL DATA:   Right lower quadrant pain and abdominal distension EXAM: CT ABDOMEN AND PELVIS WITH CONTRAST TECHNIQUE: Multidetector CT imaging of the abdomen and pelvis was performed using the standard protocol following bolus administration of intravenous contrast. CONTRAST:  49m OMNIPAQUE IOHEXOL 300 MG/ML  SOLN COMPARISON:  None. FINDINGS: Lower chest: Mild fibrotic changes are noted in the bases bilaterally. No acute infiltrate is seen. Hepatobiliary: Fatty infiltration of the liver is noted. Gallbladder is well distended with multiple gallstones within. No ductal dilatation is seen. Pancreas: Unremarkable. No pancreatic ductal dilatation or surrounding inflammatory changes. Spleen: Normal in size without focal abnormality. Adrenals/Urinary Tract: The adrenal glands are within normal limits. Kidneys demonstrate a normal enhancement pattern bilaterally. No obstructive changes are seen. A few small cysts are noted within the left kidney. No obstructive changes are seen. The bladder is partially distended. Stomach/Bowel: Mild diverticular change of the colon is noted without evidence of diverticulitis. The appendix is air-filled and within normal limits. Small bowel and stomach  are unremarkable. Vascular/Lymphatic: Aortic atherosclerosis. No enlarged abdominal or pelvic lymph nodes. Reproductive: Multiple calcified uterine fibroids are noted. No adnexal mass is noted. Other: No abdominal wall hernia or abnormality. No abdominopelvic ascites. Musculoskeletal: No acute or significant osseous findings. Degenerative anterolisthesis of L4 on L5 is noted. IMPRESSION: Fatty liver and findings of cholelithiasis. No complicating factors are noted. Normal-appearing appendix. Diverticulosis without diverticulitis. Multiple uterine fibroids. Electronically Signed   By: Inez Catalina M.D.   On: 11/03/2021 03:11      Flora Lipps, MD  [facility] 11/04/2021  If 7PM-7AM, please contact night-coverage

## 2021-11-04 NOTE — Progress Notes (Signed)
Mobility Specialist Progress Note    11/04/21 1140  Mobility  Activity Ambulated in hall  Level of Assistance Modified independent, requires aide device or extra time  Assistive Device Four wheel walker  Distance Ambulated (ft) 390 ft  Mobility Ambulated independently in hallway  Mobility Response Tolerated well  Mobility performed by Mobility specialist  Bed Position Chair  $Mobility charge 1 Mobility   Pre-Mobility: 69 HR, 129/69 BP, 96% SpO2 Post-Mobility: 75 HR  Pt received in bed and agreeable. C/o some arthritic pain in leg. Ambulated on 3LO2. Took x2 seated rest breaks. Returned to chair with call bell in reach.   [facility] Mobility Specialist  M.S. Primary Phone: [phone] M.S. Secondary Phone: [phone]

## 2021-11-04 NOTE — Care Management Obs Status (Signed)
[facility] NOTIFICATION   Patient Details  Name: Elizabeth Melton MRN: [id number] Date of Birth: 1941/08/01   Medicare Observation Status Notification Given:  Yes  Observation notice given remote, verbal permission given to sign  Verdell Carmine, RN 11/04/2021, 10:34 AM

## 2021-11-04 NOTE — Progress Notes (Signed)
Progress Note  Patient Name: Elizabeth Melton Date of Encounter: 11/04/2021  [facility] Cardiologist: [name], MD   Subjective   Pt eating lunch  Hungry  Denies CP  Breathing is iimproved at rest    Inpatient Medications    Scheduled Meds:  buPROPion  150 mg Oral Daily   docusate sodium  100 mg Oral BID   furosemide  80 mg Intravenous BID   insulin aspart  0-15 Units Subcutaneous TID WC   insulin aspart  0-5 Units Subcutaneous QHS   levothyroxine  75 mcg Oral QAC breakfast   metoprolol succinate  12.5 mg Oral Daily   nicotine  14 mg Transdermal Daily   polyethylene glycol  17 g Oral Daily   pravastatin  40 mg Oral q1800   Continuous Infusions:  heparin 1,100 Units/hr (11/04/21 1100)   PRN Meds: acetaminophen, ondansetron (ZOFRAN) IV   Vital Signs    Vitals:   11/03/21 2320 11/04/21 0346 11/04/21 0700 11/04/21 0730  BP: (!) 100/55 (!) 123/52  126/62  Pulse: 60 60  65  Resp: _0 Temp: 98 F (36.7 C) 97.8 F (36.6 C) 97.8 F (36.6 C) 97.8 F (36.6 C)  TempSrc: Oral Oral Oral Oral  SpO2: 98% 99%  95%  Weight:      Height:        Intake/Output Summary (Last 24 hours) at 11/04/2021 1141 Last data filed at 11/04/2021 1100 Gross per 24 hour  Intake 656.54 ml  Output 2150 ml  Net -1493.46 ml   Last 3 Weights 11/03/2021 11/03/2021 09/20/2021  Weight (lbs) 167 lb 8.8 oz 167 lb 168 lb 12.8 oz  Weight (kg) 76 kg 75.751 kg 76.567 kg      Telemetry    SR  - Personally Reviewed  ECG     No new - Personally Reviewed  Physical Exam   GEN: No acute distress.   Neck: JVP is increased  Cardiac: RRR, Ii/VI systolic murmur LSB   Respiratory: Clear to auscultation bilaterally. GI: Soft, nontender, non-distended  MS: Triv edema; No deformity. Neuro:  Nonfocal  Psych: Normal affect   Labs    High Sensitivity Troponin:   Recent Labs  Lab 11/03/21 0426 11/03/21 0630 11/03/21 1139 11/03/21 1404  TROPONINIHS 43* 82* 75* 57*      Chemistry Recent Labs  Lab 11/02/21 2109 11/04/21 0903  NA 139 135  K 4.4 4.5  CL 106 102  CO2 24 22  GLUCOSE 120* 198*  BUN 28* 34*  CREATININE 1.29* 1.51*  CALCIUM 9.6 8.9  MG  --  2.4  PROT 7.8  --   ALBUMIN 3.5  --   AST 27  --   ALT 21  --   ALKPHOS 49  --   BILITOT 1.3*  --   GFRNONAA 42* 35*  ANIONGAP 9 11    Lipids  Recent Labs  Lab 11/04/21 0116  CHOL 114  TRIG 31  HDL 46  LDLCALC 62  CHOLHDL 2.5    Hematology Recent Labs  Lab 11/02/21 2109 11/04/21 0116  WBC 9.5 8.9  RBC 4.62 4.29  HGB 15.8* 14.9  HCT 48.4* 44.7  MCV 104.8* 104.2*  MCH 34.2* 34.7*  MCHC 32.6 33.3  RDW 15.8* 16.3*  PLT 151 128*   Thyroid No results for input(s): TSH, FREET4 in the last 168 hours.  BNP Recent Labs  Lab 11/03/21 1139  BNP 631.9*     I/O -1.5 L  DDimer No results for input(s): DDIMER in the last 168 hours.   Radiology    DG Chest 2 View  Result Date: 11/03/2021 CLINICAL DATA:  Chest pain. EXAM: CHEST - 2 VIEW COMPARISON:  08/17/2021. FINDINGS: The heart is enlarged. Atherosclerotic calcification of the aorta is noted. Extensive interstitial thickening is present bilaterally. Airspace opacities are noted in the peripheral aspects of the lungs, which is slightly increased on the right from the prior exam. There is mild blunting of the right costophrenic angle and a small pleural effusion can not be excluded. No pneumothorax is seen. IMPRESSION: 1. Cardiomegaly. 2. Diffuse interstitial prominence bilaterally, compatible with known pulmonary fibrosis. Increased airspace disease is noted in the lateral aspects of the right lung, which may represent superimposed edema, infection, or progression of interstitial lung disease. 3. Blunting of the right costophrenic angle, possible small pleural effusion. Electronically Signed   By: Brett Fairy M.D.   On: 11/03/2021 04:33   CT ABDOMEN PELVIS W CONTRAST  Result Date: 11/03/2021 CLINICAL DATA:  Right lower  quadrant pain and abdominal distension EXAM: CT ABDOMEN AND PELVIS WITH CONTRAST TECHNIQUE: Multidetector CT imaging of the abdomen and pelvis was performed using the standard protocol following bolus administration of intravenous contrast. CONTRAST:  64m OMNIPAQUE IOHEXOL 300 MG/ML  SOLN COMPARISON:  None. FINDINGS: Lower chest: Mild fibrotic changes are noted in the bases bilaterally. No acute infiltrate is seen. Hepatobiliary: Fatty infiltration of the liver is noted. Gallbladder is well distended with multiple gallstones within. No ductal dilatation is seen. Pancreas: Unremarkable. No pancreatic ductal dilatation or surrounding inflammatory changes. Spleen: Normal in size without focal abnormality. Adrenals/Urinary Tract: The adrenal glands are within normal limits. Kidneys demonstrate a normal enhancement pattern bilaterally. No obstructive changes are seen. A few small cysts are noted within the left kidney. No obstructive changes are seen. The bladder is partially distended. Stomach/Bowel: Mild diverticular change of the colon is noted without evidence of diverticulitis. The appendix is air-filled and within normal limits. Small bowel and stomach are unremarkable. Vascular/Lymphatic: Aortic atherosclerosis. No enlarged abdominal or pelvic lymph nodes. Reproductive: Multiple calcified uterine fibroids are noted. No adnexal mass is noted. Other: No abdominal wall hernia or abnormality. No abdominopelvic ascites. Musculoskeletal: No acute or significant osseous findings. Degenerative anterolisthesis of L4 on L5 is noted. IMPRESSION: Fatty liver and findings of cholelithiasis. No complicating factors are noted. Normal-appearing appendix. Diverticulosis without diverticulitis. Multiple uterine fibroids. Electronically Signed   By: MInez CatalinaM.D.   On: 11/03/2021 03:11   ECHOCARDIOGRAM COMPLETE  Result Date: 11/04/2021    ECHOCARDIOGRAM REPORT   Patient Name:   Elizabeth PILKENTONDate of Exam: 11/04/2021  Medical Rec #:  [id number]         Height:       66.0 in Accession #:    [id number]        Weight:       167.5 lb Date of Birth:  627-Feb-1942         BSA:          1.855 m Patient Age:    80years           BP:           126/62 mmHg Patient Gender: F                  HR:           76 bpm. Exam Location:  Inpatient Procedure: 2D Echo Indications:  acute diastolic chf  History:        Patient has prior history of Echocardiogram examinations, most                 recent 10/05/2021. Signs/Symptoms:elevated troponin; Risk                 Factors:Current Smoker, Diabetes, Hypertension and Dyslipidemia.  Sonographer:    Johny Chess RDCS Referring Phys: [id number] [place]  1. Left ventricular ejection fraction, by estimation, is 60 to 65%. The left ventricle has normal function. The left ventricle has no regional wall motion abnormalities. Left ventricular diastolic parameters were normal.  2. Right ventricular systolic function is mildly reduced. The right ventricular size is mildly enlarged. There is severely elevated pulmonary artery systolic pressure.  3. Left atrial size was mildly dilated.  4. Right atrial size was moderately dilated.  5. The pericardial effusion is posterior to the left ventricle.  6. The mitral valve is abnormal. Mild mitral valve regurgitation. No evidence of mitral stenosis. Moderate mitral annular calcification.  7. Significant pulmonary HTN based on TR velocity . Tricuspid valve regurgitation is moderate.  8. CW across AV not done Manual trace from AR tracing shows stable mean gradient of 9 mmHg . The aortic valve is normal in structure. Aortic valve regurgitation is mild. Mild aortic valve stenosis.  9. The inferior vena cava is dilated in size with <50% respiratory variability, suggesting right atrial pressure of 15 mmHg. FINDINGS  Left Ventricle: Left ventricular ejection fraction, by estimation, is 60 to 65%. The left ventricle has normal function. The left  ventricle has no regional wall motion abnormalities. The left ventricular internal cavity size was normal in size. There is  no left ventricular hypertrophy. Left ventricular diastolic parameters were normal. Right Ventricle: The right ventricular size is mildly enlarged. Right vetricular wall thickness was not assessed. Right ventricular systolic function is mildly reduced. There is severely elevated pulmonary artery systolic pressure. The tricuspid regurgitant velocity is 4.62 m/s, and with an assumed right atrial pressure of 10 mmHg, the estimated right ventricular systolic pressure is 49.7 mmHg. Left Atrium: Left atrial size was mildly dilated. Right Atrium: Right atrial size was moderately dilated. Pericardium: Trivial pericardial effusion is present. The pericardial effusion is posterior to the left ventricle. Mitral Valve: The mitral valve is abnormal. There is moderate thickening of the mitral valve leaflet(s). There is moderate calcification of the mitral valve leaflet(s). Moderate mitral annular calcification. Mild mitral valve regurgitation. No evidence of mitral valve stenosis. Tricuspid Valve: Significant pulmonary HTN based on TR velocity. The tricuspid valve is normal in structure. Tricuspid valve regurgitation is moderate . No evidence of tricuspid stenosis. Aortic Valve: CW across AV not done Manual trace from AR tracing shows stable mean gradient of 9 mmHg. The aortic valve is normal in structure. Aortic valve regurgitation is mild. Aortic regurgitation PHT measures 413 msec. Mild aortic stenosis is present. Pulmonic Valve: The pulmonic valve was normal in structure. Pulmonic valve regurgitation is mild. No evidence of pulmonic stenosis. Aorta: The aortic root is normal in size and structure. Venous: The inferior vena cava is dilated in size with less than 50% respiratory variability, suggesting right atrial pressure of 15 mmHg. IAS/Shunts: No atrial level shunt detected by color flow Doppler.   LEFT VENTRICLE PLAX 2D LVIDd:         4.20 cm LVIDs:         2.90 cm LV PW:  1.00 cm LV IVS:        0.90 cm LVOT diam:     1.70 cm LV SV:         57 LV SV Index:   31 LVOT Area:     2.27 cm  RIGHT VENTRICLE            IVC RV S prime:     8.38 cm/s  IVC diam: 2.50 cm TAPSE (M-mode): 1.5 cm LEFT ATRIUM             Index        RIGHT ATRIUM           Index LA diam:        3.50 cm 1.89 cm/m   RA Area:     20.60 cm LA Vol (A2C):   63.8 ml 34.40 ml/m  RA Volume:   61.80 ml  33.32 ml/m LA Vol (A4C):   57.5 ml 31.00 ml/m LA Biplane Vol: 61.6 ml 33.21 ml/m  AORTIC VALVE LVOT Vmax:   137.00 cm/s LVOT Vmean:  85.900 cm/s LVOT VTI:    0.253 m AI PHT:      413 msec  AORTA Ao Root diam: 3.20 cm Ao Asc diam:  3.10 cm MITRAL VALVE               TRICUSPID VALVE MV Area (PHT): 2.83 cm    TR Peak grad:   85.4 mmHg MV Decel Time: 268 msec    TR Vmax:        462.00 cm/s MR Peak grad: 100.0 mmHg MR Mean grad: 57.0 mmHg    SHUNTS MR Vmax:      500.00 cm/s  Systemic VTI:  0.25 m MR Vmean:     354.0 cm/s   Systemic Diam: 1.70 cm MV E velocity: 79.40 cm/s MV A velocity: 93.00 cm/s MV E/A ratio:  0.85 Jenkins Rouge MD Electronically signed by Jenkins Rouge MD Signature Date/Time: 11/04/2021/10:35:02 AM    Final     Cardiac Studies   Relevant CV Studies:  ECHO   11/04/21  Left ventricular ejection fraction, by estimation, is 60 to 65%. The left ventricle has normal function. The left ventricle has no regional wall motion abnormalities. Left ventricular diastolic parameters were normal. 1. Right ventricular systolic function is mildly reduced. The right ventricular size is mildly enlarged. There is severely elevated pulmonary artery systolic pressure. 2. 3. Left atrial size was mildly dilated. 4. Right atrial size was moderately dilated. 5. The pericardial effusion is posterior to the left ventricle. The mitral valve is abnormal. Mild mitral valve regurgitation. No evidence of mitral stenosis. Moderate mitral  annular calcification. 6. Significant pulmonary HTN based on TR velocity . Tricuspid valve regurgitation is moderate. 7. CW across AV not done Manual trace from AR tracing shows stable mean gradient of 9 mmHg . The aortic valve is normal in structure. Aortic valve regurgitation is mild. Mild aortic valve stenosis. 8. The inferior vena cava is dilated in size with <50% respiratory variability, suggesting right atrial pressure of 15 mmHg. TTE 10/05/2021  1. Left ventricular ejection fraction, by estimation, is 60 to 65%. The  left ventricle has normal function. The left ventricle has no regional  wall motion abnormalities. There is mild left ventricular hypertrophy of  the posterior segment. Left  ventricular diastolic parameters are consistent with Grade II diastolic  dysfunction (pseudonormalization). The average left ventricular global  longitudinal strain is -15.5 %. The global longitudinal strain is normal.  2. Right ventricular systolic function is low normal. The right  ventricular size is mildly enlarged. There is severely elevated pulmonary  artery systolic pressure. The estimated right ventricular systolic  pressure is 45.4 mmHg.   3. Left atrial size was moderately dilated.   4. Right atrial size was mild to moderately dilated.   5. The mitral valve is degenerative. Mild to moderate mitral valve  regurgitation. No evidence of mitral stenosis.   6. Tricuspid valve regurgitation is moderate.   7. The aortic valve is calcified. Aortic valve regurgitation is moderate.  Mild aortic valve stenosis. Aortic regurgitation PHT measures 281 msec.  Aortic valve area, by VTI measures 1.23 cm. Aortic valve mean gradient  measures 9.2 mmHg. Aortic valve  Vmax measures 2.14 m/s.   8. The inferior vena cava is normal in size with greater than 50%  respiratory variability, suggesting right atrial pressure of 3 mmHg.   9. Compared to echo dated 05/30/2021, PASP has increased from 62.5 to   89.74mHg.    RCrayne7/26/2022 Hemodynamic findings consistent with severe pulmonary hypertension.   Severe Mixed Pulmonary Hypertension-improved with oxygen: TPG 19 mmHg Mean PAP 47 mmHg this is a 36 mmHg with 3 L [place] PCWP elevated at 28 mmHg Severely reduced Cardiac Output-Index by both Fick (3.04-1.65) and Thermodilution (3.96-2.15)  modestly improves with 3L [place] O2 (3.21-1.74) Significant oxygen responsiveness-SaO2 dropped to 83% on room air, improved to 98% on 3L [place] oxygen.  Patient Profile     Elizabeth MCAULEYis a 80y.o. 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), HFpEF, atrial fibrillation, PE, hypertension, diabetes who is being seen today for the evaluation of elevated troponin/chest pain at the request of JKarmen Bongo MD  Assessment & Plan     Acute on chronic HFpEF  Pt has diuresed some since admit   Pt is more comfortable    Still with some volume increase   Switch diuretic to daily given bump in Cr    Consider R Heart cath / possible L heat cath to confirm pressure (was mixed in passed) 3  Elevated trop  Rel flat   most likely demand in settuing of CAD     Consider L heart cath (depending on renal function) to confirm since hemodynamics have been mixed in past     4  Pulmonary HTN Probable mixed etiology  Hx of ILD and submassive PE in past     Echo today PAP estimated at 95   Continue IV diuresis for now     Heparin     4  PAF  Diagnosed while admitted with COVID in Feb 2022    Rx amiodarone    Keep on anticoaguilation    6   AKI  BUN/CR 34/1.51   Switch lasix to daily  Continue to follow     For questions or updates, please contact [facility] Please consult [email or web address] for contact info under        Signed, PDorris Carnes MD  11/04/2021, 11:41 AM

## 2021-11-04 NOTE — Plan of Care (Signed)
  Problem: Education: Goal: Ability to demonstrate management of disease process will improve Outcome: Progressing Goal: Ability to verbalize understanding of medication therapies will improve Outcome: Progressing Goal: Individualized Educational Video(s) Outcome: Progressing   Problem: Activity: Goal: Capacity to carry out activities will improve Outcome: Progressing   Problem: Cardiac: Goal: Ability to achieve and maintain adequate cardiopulmonary perfusion will improve Outcome: Progressing   Problem: Education: Goal: Knowledge of General Education information will improve Description: Including pain rating scale, medication(s)/side effects and non-pharmacologic comfort measures Outcome: Progressing   Problem: Health Behavior/Discharge Planning: Goal: Ability to manage health-related needs will improve Outcome: Progressing   Problem: Clinical Measurements: Goal: Ability to maintain clinical measurements within normal limits will improve Outcome: Progressing Goal: Will remain free from infection Outcome: Progressing Goal: Diagnostic test results will improve Outcome: Progressing Goal: Respiratory complications will improve Outcome: Progressing Goal: Cardiovascular complication will be avoided Outcome: Progressing   Problem: Activity: Goal: Risk for activity intolerance will decrease Outcome: Progressing   Problem: Nutrition: Goal: Adequate nutrition will be maintained Outcome: Progressing   Problem: Coping: Goal: Level of anxiety will decrease Outcome: Progressing   Problem: Elimination: Goal: Will not experience complications related to bowel motility Outcome: Progressing Goal: Will not experience complications related to urinary retention Outcome: Progressing   Problem: Pain Managment: Goal: General experience of comfort will improve Outcome: Progressing   Problem: Safety: Goal: Ability to remain free from injury will improve Outcome: Progressing    Problem: Skin Integrity: Goal: Risk for impaired skin integrity will decrease Outcome: Progressing  Problem: Pain Managment: Goal: General experience of comfort will improve Outcome: Progressing   Problem: Safety: Goal: Ability to remain free from injury will improve Outcome: Progressing   Problem: Skin Integrity: Goal: Risk for impaired skin integrity will decrease Outcome: Progressing   Problem: Elimination: Goal: Will not experience complications related to urinary retention Outcome: Progressing

## 2021-11-04 NOTE — Progress Notes (Signed)
ANTICOAGULATION CONSULT NOTE  Pharmacy Consult for heparin Indication: chest pain/ACS  Allergies  Allergen Reactions   Lisinopril     Other reaction(s): cough 09/20/17   Losartan Other (See Comments)    Hallucinations     Patient Measurements: Height: 5' 6" (167.6 cm) Weight: 76 kg (167 lb 8.8 oz) IBW/kg (Calculated) : 59.3 Heparin Dosing Weight: 76kg  Vital Signs: Temp: 97.8 F (36.6 C) (11/25 0730) Temp Source: Oral (11/25 0730) BP: 126/62 (11/25 0730) Pulse Rate: 65 (11/25 0730)  Labs: Recent Labs    11/02/21 2109 11/03/21 0426 11/03/21 0630 11/03/21 1139 11/03/21 1404 11/03/21 2004 11/04/21 0116 11/04/21 0903  HGB 15.8*  --   --   --   --   --  14.9  --   HCT 48.4*  --   --   --   --   --  44.7  --   PLT 151  --   --   --   --   --  128*  --   APTT  --   --   --   --   --  50* 58* 69*  HEPARINUNFRC  --   --   --   --   --  0.98* 0.97*  --   CREATININE 1.29*  --   --   --   --   --   --  1.51*  TROPONINIHS  --    < > 82* 75* 57*  --   --   --    < > = values in this interval not displayed.     Estimated Creatinine Clearance: 31.5 mL/min (A) (by C-G formula based on SCr of 1.51 mg/dL (H)).  Assessment: 56 yoF on apixaban PTA for hx PE with concern for ACS. Apixaban to transition to IV heparin for ischemic workup. Last apixaban dose was PTA 11/23 ~0800.   PTT is therapeutic today. Will continue PTT monitoring until correlate. Hgb stable. Plt down to 128  Goal of Therapy:  Heparin level 0.3-0.7 units/ml aPTT 66-102 seconds Monitor platelets by anticoagulation protocol: Yes   Plan:  Continue heparin at 1100 units/hr Daily PTT/HL/CBC  Onnie Boer, PharmD, BCIDP, AAHIVP, CPP Infectious Disease Pharmacist 11/04/2021 11:07 AM

## 2021-11-04 NOTE — Progress Notes (Signed)
ANTICOAGULATION CONSULT NOTE  Pharmacy Consult for heparin Indication: chest pain/ACS  Allergies  Allergen Reactions   Lisinopril     Other reaction(s): cough 09/20/17   Losartan Other (See Comments)    Hallucinations     Patient Measurements: Height: _0  (167.6 cm) Weight: 76 kg (167 lb 8.8 oz) IBW/kg (Calculated) : 59.3 Heparin Dosing Weight: 76kg  Vital Signs: Temp: 98 F (36.7 C) (11/24 2320) Temp Source: Oral (11/24 2320) BP: 100/55 (11/24 2320) Pulse Rate: 60 (11/24 2320)  Labs: Recent Labs    11/02/21 2109 11/03/21 0426 11/03/21 0630 11/03/21 1139 11/03/21 1404 11/03/21 2004 11/04/21 0116  HGB 15.8*  --   --   --   --   --  14.9  HCT 48.4*  --   --   --   --   --  44.7  PLT 151  --   --   --   --   --  128*  APTT  --   --   --   --   --  50* 58*  HEPARINUNFRC  --   --   --   --   --  0.98* 0.97*  CREATININE 1.29*  --   --   --   --   --   --   TROPONINIHS  --    < > 82* 75* 57*  --   --    < > = values in this interval not displayed.     Estimated Creatinine Clearance: 36.8 mL/min (A) (by C-G formula based on SCr of 1.29 mg/dL (H)).  Assessment: 87 yoF on apixaban PTA for hx PE with concern for ACS. Apixaban to transition to IV heparin for ischemic workup. Last apixaban dose was PTA 11/23 ~0800.   aPTT remains subtherapeutic (58 sec) on 1100 units/hr. This level was only drawn 3 hours post gtt rate change so not accurate. Heparin level 0.97 (still affected by apixaban). No issues with line or bleeding reported per RN.  Goal of Therapy:  Heparin level 0.3-0.7 units/ml aPTT 66-102 seconds Monitor platelets by anticoagulation protocol: Yes   Plan:  Continue heparin at 1100 units/hr F/u aPTT 8 hours post rate change  Sherlon Handing, PharmD, BCPS Please see amion for complete clinical pharmacist phone list 11/04/2021 2:57 AM

## 2021-11-04 NOTE — TOC Initial Note (Signed)
Transition of Care [facility]) - Initial/Assessment Note    Patient Details  Name: Elizabeth Melton MRN: [id number] Date of Birth: 1941/05/18  Transition of Care [facility]) CM/SW Contact:    Verdell Carmine, RN Phone Number: 11/04/2021, 10:39 AM  Clinical Narrative:                 Patient presented with ACS, on heparin. Lives at home with adult children. Currently receiving oxygen from [place]. Is open to home health services, states she has trouble with ADL and movement, PT consult placed. For recommendations. She has wheelchair and oxygen, needs shower chair. CM will follow for needs, recommendations, and transitions.   Expected Discharge Plan: [place] Barriers to Discharge: Continued Medical Work up   Patient Goals and CMS Choice        Expected Discharge Plan and Services Expected Discharge Plan: [place]   Discharge Planning Services: CM Consult Post Acute Care Choice: [place] arrangements for the past 2 months: Apartment                 DME Arranged: Shower stool   Date DME Agency Contacted: 11/04/21 Time DME Agency Contacted: 76 Representative spoke with at DME Agency: Freda Munro            Prior Living Arrangements/Services Living arrangements for the past 2 months: Apartment   Patient language and need for interpreter reviewed:: Yes Do you feel safe going back to the place where you live?: Yes      Need for Family Participation in Patient Care: Yes (Comment) Care giver support system in place?: Yes (comment)   Criminal Activity/Legal Involvement Pertinent to Current Situation/Hospitalization: No - Comment as needed  Activities of Daily Living Home Assistive Devices/Equipment: Cane (specify quad or straight), Walker (specify type) ADL Screening (condition at time of admission) Patient's cognitive ability adequate to safely complete daily activities?: No Is the patient deaf or have difficulty hearing?: No Does  the patient have difficulty seeing, even when wearing glasses/contacts?: No Does the patient have difficulty concentrating, remembering, or making decisions?: No Patient able to express need for assistance with ADLs?: Yes Does the patient have difficulty dressing or bathing?: No Independently performs ADLs?: Yes (appropriate for developmental age) Does the patient have difficulty walking or climbing stairs?: Yes Weakness of Legs: Both Weakness of Arms/Hands: Both  Permission Sought/Granted                  Emotional Assessment       Orientation: : Oriented to Self, Oriented to Place, Oriented to  Time, Oriented to Situation Alcohol / Substance Use: Not Applicable Psych Involvement: No (comment)  Admission diagnosis:  Generalized abdominal pain [R10.84] Elevated troponin [R77.8] Chest pain, unspecified type [R07.9] Patient Active Problem List   Diagnosis Date Noted   Elevated troponin 11/03/2021   Abdominal pain 11/03/2021   Acute on chronic respiratory failure with hypoxemia ([place]) 07/14/2021   Chronic diastolic heart failure ([place]) 07/05/2021   Pulmonary hypertension ([place])    Acute on chronic systolic (congestive) heart failure ([place]) 05/19/2021   Acute on chronic respiratory failure with hypoxia ([place]) 05/19/2021   SOB (shortness of breath) 05/19/2021   AF (paroxysmal atrial fibrillation) ([place]) 05/19/2021   Acute on chronic congestive heart failure ([place])    Atrial fibrillation with RVR ([place])    Demand ischemia ([place])    Pneumonia due to COVID-19 virus 01/16/2021   Acute respiratory failure with hypoxia ([place]) 09/06/2020  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[place]) 09/05/2020   ILD (interstitial lung disease) ([place]) 10/23/2019   Chronic respiratory failure with hypoxia ([place]) 10/23/2019   Essential (primary) hypertension 09/18/2018   PAD (peripheral artery disease) ([place]) 09/18/2018   Tobacco abuse 09/18/2018   Aortic regurgitation 09/18/2018   Hypothyroidism     Hyperlipidemia    Diabetes mellitus without complication ([place])    Lower extremity pain 09/24/2012   PCP:  Kathyrn Lass, MD Pharmacy:   [id number] [place], [place] [id number]: [phone]: [phone] [facility] [address] [place]Phone: [phone]: [phone]    Social Determinants of Health (SDOH) Interventions    Readmission Risk Interventions Readmission Risk Prevention Plan 09/10/2020  Post Dischage Appt Complete  Medication Screening Complete  Transportation Screening Complete  Some recent data might be hidden

## 2021-11-05 DIAGNOSIS — R1084 Generalized abdominal pain: Secondary | ICD-10-CM | POA: Diagnosis not present

## 2021-11-05 DIAGNOSIS — I5032 Chronic diastolic (congestive) heart failure: Secondary | ICD-10-CM | POA: Diagnosis not present

## 2021-11-05 DIAGNOSIS — E119 Type 2 diabetes mellitus without complications: Secondary | ICD-10-CM | POA: Diagnosis not present

## 2021-11-05 DIAGNOSIS — I480 Paroxysmal atrial fibrillation: Secondary | ICD-10-CM | POA: Diagnosis not present

## 2021-11-05 LAB — CBC
HCT: 44.3 % (ref 36.0–46.0)
Hemoglobin: 14.7 g/dL (ref 12.0–15.0)
MCH: 34.3 pg — ABNORMAL HIGH (ref 26.0–34.0)
MCHC: 33.2 g/dL (ref 30.0–36.0)
MCV: 103.5 fL — ABNORMAL HIGH (ref 80.0–100.0)
Platelets: 113 10*3/uL — ABNORMAL LOW (ref 150–400)
RBC: 4.28 MIL/uL (ref 3.87–5.11)
RDW: 16.1 % — ABNORMAL HIGH (ref 11.5–15.5)
WBC: 8.2 10*3/uL (ref 4.0–10.5)
nRBC: 2.1 % — ABNORMAL HIGH (ref 0.0–0.2)

## 2021-11-05 LAB — GLUCOSE, CAPILLARY
Glucose-Capillary: 159 mg/dL — ABNORMAL HIGH (ref 70–99)
Glucose-Capillary: 278 mg/dL — ABNORMAL HIGH (ref 70–99)
Glucose-Capillary: 144 mg/dL — ABNORMAL HIGH (ref 70–99)
Glucose-Capillary: 136 mg/dL — ABNORMAL HIGH (ref 70–99)

## 2021-11-05 LAB — BASIC METABOLIC PANEL
Anion gap: 8 (ref 5–15)
BUN: 42 mg/dL — ABNORMAL HIGH (ref 8–23)
CO2: 25 mmol/L (ref 22–32)
Calcium: 8.3 mg/dL — ABNORMAL LOW (ref 8.9–10.3)
Chloride: 100 mmol/L (ref 98–111)
Creatinine, Ser: 1.4 mg/dL — ABNORMAL HIGH (ref 0.44–1.00)
GFR, Estimated: 38 mL/min — ABNORMAL LOW (ref >60)
Glucose, Bld: 247 mg/dL — ABNORMAL HIGH (ref 70–99)
Potassium: 3.9 mmol/L (ref 3.5–5.1)
Sodium: 133 mmol/L — ABNORMAL LOW (ref 135–145)

## 2021-11-05 LAB — HEPARIN LEVEL (UNFRACTIONATED)
Heparin Unfractionated: 0.53 IU/mL (ref 0.30–0.70)
Heparin Unfractionated: 0.57 IU/mL (ref 0.30–0.70)

## 2021-11-05 LAB — MAGNESIUM: Magnesium: 2.3 mg/dL (ref 1.7–2.4)

## 2021-11-05 LAB — PHOSPHORUS: Phosphorus: 4.4 mg/dL (ref 2.5–4.6)

## 2021-11-05 LAB — APTT: aPTT: 76 seconds — ABNORMAL HIGH (ref 24–36)

## 2021-11-05 MED ORDER — FUROSEMIDE 10 MG/ML IJ SOLN
80.0000 mg | Freq: Two times a day (BID) | INTRAMUSCULAR | Status: DC
  Administered 2021-11-05 – 2021-11-06 (×3): 80 mg via INTRAVENOUS
  Filled 2021-11-05 (×3): qty 8

## 2021-11-05 MED ORDER — POTASSIUM CHLORIDE CRYS ER 20 MEQ PO TBCR
40.0000 meq | EXTENDED_RELEASE_TABLET | Freq: Once | ORAL | Status: AC
Start: 2021-11-05 — End: 2021-11-05
  Administered 2021-11-05: 40 meq via ORAL
  Filled 2021-11-05: qty 2

## 2021-11-05 NOTE — Progress Notes (Signed)
ANTICOAGULATION CONSULT NOTE  Pharmacy Consult for heparin Indication: chest pain/ACS  Allergies  Allergen Reactions   Lisinopril     Other reaction(s): cough 09/20/17   Losartan Other (See Comments)    Hallucinations     Patient Measurements: Height: _0  (167.6 cm) Weight: 74.9 kg (165 lb 2 oz) IBW/kg (Calculated) : 59.3 Heparin Dosing Weight: 76kg  Vital Signs: Temp: 98.5 F (36.9 C) (11/26 0318) Temp Source: Oral (11/26 0318) BP: 120/50 (11/26 0318) Pulse Rate: 49 (11/26 0604)  Labs: Recent Labs    11/02/21 2109 11/02/21 2109 11/03/21 0426 11/03/21 0630 11/03/21 1139 11/03/21 1404 11/03/21 2004 11/04/21 0116 11/04/21 0903 11/05/21 0122  HGB 15.8*  --   --   --   --   --   --  14.9  --  14.7  HCT 48.4*  --   --   --   --   --   --  44.7  --  44.3  PLT 151  --   --   --   --   --   --  128*  --  113*  APTT  --    < >  --   --   --   --  50* 58* 69* 76*  HEPARINUNFRC  --   --   --   --   --   --  0.98* 0.97*  --  0.53  CREATININE 1.29*  --   --   --   --   --   --   --  1.51* 1.40*  TROPONINIHS  --   --    < > 82* 75* 57*  --   --   --   --    < > = values in this interval not displayed.     Estimated Creatinine Clearance: 33.7 mL/min (A) (by C-G formula based on SCr of 1.4 mg/dL (H)).  Assessment: 58 yoF on apixaban PTA for hx PE with concern for ACS. Apixaban to transition to IV heparin for ischemic workup. Possible plan for left and right heart cath. Last apixaban dose was PTA 11/23 ~0800.   Heparin level now therapeutic at 0.53 and correlating with aPTT of 76 sec. Move to monitoring with HL. Heparin gtt at 1100 units/hr. Hemoglobin and plts stable. Plts on lower end at 113, will continue to monitor. No issues with line or bleeding reported per RN.  Goal of Therapy:  Heparin level 0.3-0.7 units/ml aPTT 66-102 seconds Monitor platelets by anticoagulation protocol: Yes   Plan:  Continue heparin at 1100 units/hr HL in 8 hours to confirm then can move  to daily monitoring HL and CBC daily while on heparin Follow-up transition back to [place], PharmD PGY2 Cardiology Pharmacy Resident Phone: [phone] 11/05/2021  7:48 AM  Please check [email or web address] for unit-specific pharmacy phone numbers.

## 2021-11-05 NOTE — Progress Notes (Signed)
ANTICOAGULATION CONSULT NOTE  Pharmacy Consult for heparin Indication: chest pain/ACS  Allergies  Allergen Reactions   Lisinopril     Other reaction(s): cough 09/20/17   Losartan Other (See Comments)    Hallucinations     Patient Measurements: Height: 5' 6" (167.6 cm) Weight: 74.9 kg (165 lb 2 oz) IBW/kg (Calculated) : 59.3 Heparin Dosing Weight: 76kg  Vital Signs: Temp: 97.9 F (36.6 C) (11/26 1945) Temp Source: Oral (11/26 1945) BP: 137/57 (11/26 1945) Pulse Rate: 60 (11/26 1945)  Labs: Recent Labs    11/03/21 0630 11/03/21 1139 11/03/21 1404 11/03/21 2004 11/04/21 0116 11/04/21 0903 11/05/21 0122 11/05/21 1532  HGB  --   --   --   --  14.9  --  14.7  --   HCT  --   --   --   --  44.7  --  44.3  --   PLT  --   --   --   --  128*  --  113*  --   APTT  --   --   --    < > 58* 69* 76*  --   HEPARINUNFRC  --   --   --    < > 0.97*  --  0.53 0.57  CREATININE  --   --   --   --   --  1.51* 1.40*  --   TROPONINIHS 82* 75* 57*  --   --   --   --   --    < > = values in this interval not displayed.     Estimated Creatinine Clearance: 33.7 mL/min (A) (by C-G formula based on SCr of 1.4 mg/dL (H)).  Assessment: 31 yoF on apixaban PTA for hx PE with concern for ACS. Apixaban to transition to IV heparin for ischemic workup. Possible plan for left and right heart cath. Last apixaban dose was PTA 11/23 ~0800.   Heparin level now therapeutic at 0.57 on heparin drip 1100 uts/hr Hemoglobin and plts stable. Plts on lower end at 113, will continue to monitor. No issues with line or bleeding reported per RN.  Goal of Therapy:  Heparin level 0.3-0.7 units/ml aPTT 66-102 seconds Monitor platelets by anticoagulation protocol: Yes   Plan:  Continue heparin at 1100 units/hr HL and CBC daily while on heparin Follow-up transition back to [place].D. CPP, BCPS Clinical Pharmacist [phone] 11/05/2021 9:39 PM    Please check [email or web address] for  unit-specific pharmacy phone numbers.

## 2021-11-05 NOTE — Progress Notes (Signed)
PROGRESS NOTE  Elizabeth Melton HCW:[id number] DOB: [birth year] DOA: 11/02/2021 PCP: Kathyrn Lass, MD   LOS: 1 day   Brief narrative:  Elizabeth Melton is a 80 y.o. 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  She was very constipated and has not had a bowel movement for a week.  In the ED patient had elevated troponins.  Cardiology was consulted and patient was admitted hospital for further evaluation and treatment.  Assessment/Plan:  Principal Problem:   Abdominal pain Active Problems:   Hypothyroidism   Hyperlipidemia   Diabetes mellitus without complication ([place])   Essential (primary) hypertension   Tobacco abuse   ILD (interstitial lung disease) (HCC)   AF (paroxysmal atrial fibrillation) ([facility])   Chronic diastolic heart failure ([facility])   Elevated troponin  Abdominal pain Likely nonspecific.  Improved after having a bowel movement.  CT scan showing fatty liver cholelithiasis, uterine fibroids.  We will continue to monitor.   Lipase was 35.   Elevated troponin with intermittent chest pain. Elevated troponins with positive delta.  Cardiology on board.; likely associated with demand ischemia.  2D echocardiogram with LV ejection fraction of 60 to 65%.  We will follow cardiology recommendation for possible need for cardiac cath.   Interstitial lung disease with severe pulmonary hypertension.   On home oxygen at 3 L/min..  Continue IV diuresis with IV Lasix once daily.  Follows up with pulmonary as outpatient.  COVID and influenza was negative.   Chronic diastolic CHF 2D echocardiogram on 17/61/60-VPXTG 2 diastolic dysfunction and severe pulmonary HTN.  On IV diuretics.  Cardiology on board.  BNP  was elevated at 631.  DM type II. Hemoglobin A1c pending.  Supposed to be on Lantus and sliding scale at home.  Continue sliding scale insulin for now.  We will continue to monitor closely.   Essential HTN Continue metoprolol succinate.  Blood pressure seems to be stable so far.   Hyperlipidemia Continue Mevacor while in the hospital.  Patient takes pravastatin at home.   Hypothyroidism Continue Synthroid  H/o PE/Afib history of atrial fibrillation and PE.   Eliquis on hold.  Currently on heparin drip.  Rate controlled with metoprolol.   Mood disorder Continue Wellbutrin   Tobacco dependence Continue patch.  DVT prophylaxis:   Heparin drip   Code Status: Full code  Family Communication:   I spoke with the son's daughter on the phone and updated her about the clinical condition of the patient and the plan for possible cardiac intervention  Status is: Inpatient  The patient is inpatient because: Respiratory failure, abdominal pain, elevated troponin, possible cardiac catheterization.   Consultants: Cardiology  Procedures: 2D echocardiogram  Anti-infectives:  None  Anti-infectives (From admission, onward)    None      Subjective: Today, patient was seen and examined at bedside.  ??  Patient was seen and examined at bedside.  States that she feels a little better with breathing.  Constipation has improved and had a large bowel movement.  Denies any chest pain.  Objective: Vitals:   11/05/21 0318 11/05/21 0604  BP: (!) 120/50   Pulse: (!) 54 (!) 49  Resp: 18   Temp: 98.5 F (36.9 C)   SpO2: 100%     Intake/Output Summary (  Last 24 hours) at 11/05/2021 0802 Last data filed at 11/05/2021 0532 Gross per 24 hour  Intake 1202.1 ml  Output 975 ml  Net 227.1 ml    Filed Weights   11/03/21 0229 11/03/21 1115 11/05/21 0534  Weight: 75.8 kg 76 kg 74.9 kg   Body mass index is 26.65 kg/m.   Physical Exam: General:  Average built, not in obvious  distress, on nasal cannula oxygen HENT:   No scleral pallor or icterus noted. Oral mucosa is moist.  Chest:.  Diminished breath sounds bilaterally.  Diffuse crackles noted from interstitial lung disease. CVS: S1 &S2 heard.  Systolic murmur noted.   Abdomen: Soft, nontender, nondistended.  Bowel sounds are heard.  Extremities: No cyanosis, clubbing with trace bilateral lower extremity edema peripheral pulses are palpable. Psych: Alert, awake and oriented, normal mood CNS:  No cranial nerve deficits.  Power equal in all extremities.   Skin: Warm and dry.  No rashes noted.   Data Review: I have personally reviewed the following laboratory data and studies,  CBC: Recent Labs  Lab 11/02/21 2109 11/04/21 0116 11/05/21 0122  WBC 9.5 8.9 8.2  NEUTROABS 7.0  --   --   HGB 15.8* 14.9 14.7  HCT 48.4* 44.7 44.3  MCV 104.8* 104.2* 103.5*  PLT 151 128* 113*    Basic Metabolic Panel: Recent Labs  Lab 11/02/21 2109 11/04/21 0903 11/05/21 0122  NA 139 135 133*  K 4.4 4.5 3.9  CL 106 102 100  CO2 _0 GLUCOSE 120* 198* 247*  BUN 28* 34* 42*  CREATININE 1.29* 1.51* 1.40*  CALCIUM 9.6 8.9 8.3*  MG  --  2.4 2.3  PHOS  --   --  4.4    Liver Function Tests: Recent Labs  Lab 11/02/21 2109  AST 27  ALT 21  ALKPHOS 49  BILITOT 1.3*  PROT 7.8  ALBUMIN 3.5    Recent Labs  Lab 11/02/21 2109  LIPASE 35    No results for input(s): AMMONIA in the last 168 hours. Cardiac Enzymes: No results for input(s): CKTOTAL, CKMB, CKMBINDEX, TROPONINI in the last 168 hours. BNP (last 3 results) Recent Labs    07/15/21 0323 09/20/21 1449 11/03/21 1139  BNP 387.8* 233.1* 631.9*     ProBNP (last 3 results) No results for input(s): PROBNP in the last 8760 hours.  CBG: Recent Labs  Lab 11/04/21 0632 11/04/21 1201 11/04/21 1603 11/04/21 2122 11/05/21 0616  GLUCAP 155* 120* 270* 95 159*    Recent Results (from the past 240 hour(s))  Resp Panel by RT-PCR (Flu A&B, Covid)  Nasopharyngeal Swab     Status: None   Collection Time: 11/03/21  9:11 AM   Specimen: Nasopharyngeal Swab; Nasopharyngeal(NP) swabs in vial transport medium  Result Value Ref Range Status   SARS Coronavirus 2 by RT PCR NEGATIVE NEGATIVE Final    Comment: (NOTE) SARS-CoV-2 target nucleic acids are NOT DETECTED.  The SARS-CoV-2 RNA is generally detectable in upper respiratory specimens during the acute phase of infection. The lowest concentration of SARS-CoV-2 viral copies this assay can detect is 138 copies/mL. A negative result does not preclude SARS-Cov-2 infection and should not be used as the sole basis for treatment or other patient management decisions. A negative result may occur with  improper specimen collection/handling, submission of specimen other than nasopharyngeal swab, presence of viral mutation(s) within the areas targeted by this assay, and inadequate number of viral copies(<138 copies/mL). A negative result must be combined with  clinical observations, patient history, and epidemiological information. The expected result is Negative.  Fact Sheet for Patients:  EntrepreneurPulse.com.au  Fact Sheet for Healthcare Providers:  IncredibleEmployment.be  This test is no t yet approved or cleared by the [place] FDA and  has been authorized for detection and/or diagnosis of SARS-CoV-2 by FDA under an Emergency Use Authorization (EUA). This EUA will remain  in effect (meaning this test can be used) for the duration of the COVID-19 declaration under Section 564(b)(1) of the Act, 21 U.S.C.section 360bbb-3(b)(1), unless the authorization is terminated  or revoked sooner.       Influenza A by PCR NEGATIVE NEGATIVE Final   Influenza B by PCR NEGATIVE NEGATIVE Final    Comment: (NOTE) The Xpert Xpress SARS-CoV-2/FLU/RSV plus assay is intended as an aid in the diagnosis of influenza from Nasopharyngeal swab specimens and should not be  used as a sole basis for treatment. Nasal washings and aspirates are unacceptable for Xpert Xpress SARS-CoV-2/FLU/RSV testing.  Fact Sheet for Patients: EntrepreneurPulse.com.au  Fact Sheet for Healthcare Providers: IncredibleEmployment.be  This test is not yet approved or cleared by the [place] FDA and has been authorized for detection and/or diagnosis of SARS-CoV-2 by FDA under an Emergency Use Authorization (EUA). This EUA will remain in effect (meaning this test can be used) for the duration of the COVID-19 declaration under Section 564(b)(1) of the Act, 21 U.S.C. section 360bbb-3(b)(1), unless the authorization is terminated or revoked.  Performed at [facility], [place] [address] [place], [place] [address]   MRSA Next Gen by PCR, Nasal     Status: None   Collection Time: 11/03/21  1:20 PM   Specimen: Nasal Mucosa; Nasal Swab  Result Value Ref Range Status   MRSA by PCR Next Gen NOT DETECTED NOT DETECTED Final    Comment: (NOTE) The GeneXpert MRSA Assay (FDA approved for NASAL specimens only), is one component of a comprehensive MRSA colonization surveillance program. It is not intended to diagnose MRSA infection nor to guide or monitor treatment for MRSA infections. Test performance is not FDA approved in patients less than 55 years old. Performed at [facility], [place] [address], [place], [place] [address]       Studies: ECHOCARDIOGRAM COMPLETE  Result Date: 11/04/2021    ECHOCARDIOGRAM REPORT   Patient Name:   Elizabeth Melton Date of Exam: 11/04/2021 Medical Rec #:  [id number]          Height:       66.0 in Accession #:    [id number]         Weight:       167.5 lb Date of Birth:  [birth year]          BSA:          1.855 m Patient Age:    39 years           BP:           126/62 mmHg Patient Gender: F                  HR:           76 bpm. Exam Location:  Inpatient Procedure: 2D Echo Indications:    acute diastolic  chf  History:        Patient has prior history of Echocardiogram examinations, most                 recent 10/05/2021. Signs/Symptoms:elevated troponin; Risk  Factors:Current Smoker, Diabetes, Hypertension and Dyslipidemia.  Sonographer:    Johny Chess RDCS Referring Phys: [id number] [place]  1. Left ventricular ejection fraction, by estimation, is 60 to 65%. The left ventricle has normal function. The left ventricle has no regional wall motion abnormalities. Left ventricular diastolic parameters were normal.  2. Right ventricular systolic function is mildly reduced. The right ventricular size is mildly enlarged. There is severely elevated pulmonary artery systolic pressure.  3. Left atrial size was mildly dilated.  4. Right atrial size was moderately dilated.  5. The pericardial effusion is posterior to the left ventricle.  6. The mitral valve is abnormal. Mild mitral valve regurgitation. No evidence of mitral stenosis. Moderate mitral annular calcification.  7. Significant pulmonary HTN based on TR velocity . Tricuspid valve regurgitation is moderate.  8. CW across AV not done Manual trace from AR tracing shows stable mean gradient of 9 mmHg . The aortic valve is normal in structure. Aortic valve regurgitation is mild. Mild aortic valve stenosis.  9. The inferior vena cava is dilated in size with <50% respiratory variability, suggesting right atrial pressure of 15 mmHg. FINDINGS  Left Ventricle: Left ventricular ejection fraction, by estimation, is 60 to 65%. The left ventricle has normal function. The left ventricle has no regional wall motion abnormalities. The left ventricular internal cavity size was normal in size. There is  no left ventricular hypertrophy. Left ventricular diastolic parameters were normal. Right Ventricle: The right ventricular size is mildly enlarged. Right vetricular wall thickness was not assessed. Right ventricular systolic function is mildly  reduced. There is severely elevated pulmonary artery systolic pressure. The tricuspid regurgitant velocity is 4.62 m/s, and with an assumed right atrial pressure of 10 mmHg, the estimated right ventricular systolic pressure is 33.8 mmHg. Left Atrium: Left atrial size was mildly dilated. Right Atrium: Right atrial size was moderately dilated. Pericardium: Trivial pericardial effusion is present. The pericardial effusion is posterior to the left ventricle. Mitral Valve: The mitral valve is abnormal. There is moderate thickening of the mitral valve leaflet(s). There is moderate calcification of the mitral valve leaflet(s). Moderate mitral annular calcification. Mild mitral valve regurgitation. No evidence of mitral valve stenosis. Tricuspid Valve: Significant pulmonary HTN based on TR velocity. The tricuspid valve is normal in structure. Tricuspid valve regurgitation is moderate . No evidence of tricuspid stenosis. Aortic Valve: CW across AV not done Manual trace from AR tracing shows stable mean gradient of 9 mmHg. The aortic valve is normal in structure. Aortic valve regurgitation is mild. Aortic regurgitation PHT measures 413 msec. Mild aortic stenosis is present. Pulmonic Valve: The pulmonic valve was normal in structure. Pulmonic valve regurgitation is mild. No evidence of pulmonic stenosis. Aorta: The aortic root is normal in size and structure. Venous: The inferior vena cava is dilated in size with less than 50% respiratory variability, suggesting right atrial pressure of 15 mmHg. IAS/Shunts: No atrial level shunt detected by color flow Doppler.  LEFT VENTRICLE PLAX 2D LVIDd:         4.20 cm LVIDs:         2.90 cm LV PW:         1.00 cm LV IVS:        0.90 cm LVOT diam:     1.70 cm LV SV:         57 LV SV Index:   31 LVOT Area:     2.27 cm  RIGHT VENTRICLE  IVC RV S prime:     8.38 cm/s  IVC diam: 2.50 cm TAPSE (M-mode): 1.5 cm LEFT ATRIUM             Index        RIGHT ATRIUM           Index LA diam:         3.50 cm 1.89 cm/m   RA Area:     20.60 cm LA Vol (A2C):   63.8 ml 34.40 ml/m  RA Volume:   61.80 ml  33.32 ml/m LA Vol (A4C):   57.5 ml 31.00 ml/m LA Biplane Vol: 61.6 ml 33.21 ml/m  AORTIC VALVE LVOT Vmax:   137.00 cm/s LVOT Vmean:  85.900 cm/s LVOT VTI:    0.253 m AI PHT:      413 msec  AORTA Ao Root diam: 3.20 cm Ao Asc diam:  3.10 cm MITRAL VALVE               TRICUSPID VALVE MV Area (PHT): 2.83 cm    TR Peak grad:   85.4 mmHg MV Decel Time: 268 msec    TR Vmax:        462.00 cm/s MR Peak grad: 100.0 mmHg MR Mean grad: 57.0 mmHg    SHUNTS MR Vmax:      500.00 cm/s  Systemic VTI:  0.25 m MR Vmean:     354.0 cm/s   Systemic Diam: 1.70 cm MV E velocity: 79.40 cm/s MV A velocity: 93.00 cm/s MV E/A ratio:  0.85 Jenkins Rouge MD Electronically signed by Jenkins Rouge MD Signature Date/Time: 11/04/2021/10:35:02 AM    Final      Flora Lipps, MD  [facility] 11/05/2021  If 7PM-7AM, please contact night-coverage

## 2021-11-05 NOTE — Progress Notes (Signed)
Progress Note  Patient Name: Elizabeth Melton Date of Encounter: 11/05/2021  Primary Cardiologist: [name], MD   Subjective   She wants to go home today. However, we discussed that premature discharge without adequate diuresis and workup may lead to early readmission, and she is willing to stay through weekend.  Poor diuresis yesterday with only once daily lasix, renal function with slight improvement.  She feels better after receiving humidified oxygen, recommend she go home with humidity with her home O2.   Inpatient Medications    Scheduled Meds:  buPROPion  150 mg Oral Daily   docusate sodium  100 mg Oral BID   furosemide  80 mg Intravenous Daily   insulin aspart  0-15 Units Subcutaneous TID WC   insulin aspart  0-5 Units Subcutaneous QHS   levothyroxine  75 mcg Oral QAC breakfast   metoprolol succinate  12.5 mg Oral Daily   nicotine  14 mg Transdermal Daily   polyethylene glycol  17 g Oral Daily   potassium chloride  40 mEq Oral Once   pravastatin  40 mg Oral q1800   Continuous Infusions:  heparin 1,100 Units/hr (11/05/21 0809)   PRN Meds: acetaminophen, ondansetron (ZOFRAN) IV   Vital Signs    Vitals:   11/05/21 0318 11/05/21 0534 11/05/21 0604 11/05/21 0800  BP: (!) 120/50   123/60  Pulse: (!) 54  (!) 49 64  Resp: 18   (!) 21  Temp: 98.5 F (36.9 C)   98.5 F (36.9 C)  TempSrc: Oral   Oral  SpO2: 100%   97%  Weight:  74.9 kg    Height:        Intake/Output Summary (Last 24 hours) at 11/05/2021 0928 Last data filed at 11/05/2021 0532 Gross per 24 hour  Intake 820.1 ml  Output 975 ml  Net -154.9 ml   Filed Weights   11/03/21 0229 11/03/21 1115 11/05/21 0534  Weight: 75.8 kg 76 kg 74.9 kg    Telemetry    Sinus bradycardia RBBB - Personally Reviewed  ECG    SR, RAE - Personally Reviewed  Physical Exam   GEN: No acute distress.   Neck: JVD with prominent V wave, at 90 deg JVP to lower 1/3 of neck Cardiac: regular rhythm,  normal rate, 2/6 HSM across precordium, no rubs, or gallops.  Respiratory: coarse breath sounds without rales GI: Soft, nontender, non-distended  MS: No edema; No deformity. Neuro:  Nonfocal  Psych: Normal affect   Labs    Chemistry Recent Labs  Lab 11/02/21 2109 11/04/21 0903 11/05/21 0122  NA 139 135 133*  K 4.4 4.5 3.9  CL 106 102 100  CO2 _0 GLUCOSE 120* 198* 247*  BUN 28* 34* 42*  CREATININE 1.29* 1.51* 1.40*  CALCIUM 9.6 8.9 8.3*  PROT 7.8  --   --   ALBUMIN 3.5  --   --   AST 27  --   --   ALT 21  --   --   ALKPHOS 49  --   --   BILITOT 1.3*  --   --   GFRNONAA 42* 35* 38*  ANIONGAP _1 Hematology Recent Labs  Lab 11/02/21 2109 11/04/21 0116 11/05/21 0122  WBC 9.5 8.9 8.2  RBC 4.62 4.29 4.28  HGB 15.8* 14.9 14.7  HCT 48.4* 44.7 44.3  MCV 104.8* 104.2* 103.5*  MCH 34.2* 34.7* 34.3*  MCHC 32.6 33.3 33.2  RDW 15.8*  16.3* 16.1*  PLT 151 128* 113*    Cardiac EnzymesNo results for input(s): TROPONINI in the last 168 hours. No results for input(s): TROPIPOC in the last 168 hours.   BNP Recent Labs  Lab 11/03/21 1139  BNP 631.9*     DDimer No results for input(s): DDIMER in the last 168 hours.   Radiology    ECHOCARDIOGRAM COMPLETE  Result Date: 11/04/2021    ECHOCARDIOGRAM REPORT   Patient Name:   Elizabeth Melton Date of Exam: 11/04/2021 Medical Rec #:  [id number]          Height:       66.0 in Accession #:    [id number]         Weight:       167.5 lb Date of Birth:  May 24, 1941          BSA:          1.855 m Patient Age:    38 years           BP:           126/62 mmHg Patient Gender: F                  HR:           76 bpm. Exam Location:  Inpatient Procedure: 2D Echo Indications:    acute diastolic chf  History:        Patient has prior history of Echocardiogram examinations, most                 recent 10/05/2021. Signs/Symptoms:elevated troponin; Risk                 Factors:Current Smoker, Diabetes, Hypertension and Dyslipidemia.   Sonographer:    Johny Chess RDCS Referring Phys: [id number] [place]  1. Left ventricular ejection fraction, by estimation, is 60 to 65%. The left ventricle has normal function. The left ventricle has no regional wall motion abnormalities. Left ventricular diastolic parameters were normal.  2. Right ventricular systolic function is mildly reduced. The right ventricular size is mildly enlarged. There is severely elevated pulmonary artery systolic pressure.  3. Left atrial size was mildly dilated.  4. Right atrial size was moderately dilated.  5. The pericardial effusion is posterior to the left ventricle.  6. The mitral valve is abnormal. Mild mitral valve regurgitation. No evidence of mitral stenosis. Moderate mitral annular calcification.  7. Significant pulmonary HTN based on TR velocity . Tricuspid valve regurgitation is moderate.  8. CW across AV not done Manual trace from AR tracing shows stable mean gradient of 9 mmHg . The aortic valve is normal in structure. Aortic valve regurgitation is mild. Mild aortic valve stenosis.  9. The inferior vena cava is dilated in size with <50% respiratory variability, suggesting right atrial pressure of 15 mmHg. FINDINGS  Left Ventricle: Left ventricular ejection fraction, by estimation, is 60 to 65%. The left ventricle has normal function. The left ventricle has no regional wall motion abnormalities. The left ventricular internal cavity size was normal in size. There is  no left ventricular hypertrophy. Left ventricular diastolic parameters were normal. Right Ventricle: The right ventricular size is mildly enlarged. Right vetricular wall thickness was not assessed. Right ventricular systolic function is mildly reduced. There is severely elevated pulmonary artery systolic pressure. The tricuspid regurgitant velocity is 4.62 m/s, and with an assumed right atrial pressure of 10 mmHg, the estimated right ventricular systolic pressure is 09.4 mmHg. Left  Atrium: Left atrial size was mildly dilated. Right Atrium: Right atrial size was moderately dilated. Pericardium: Trivial pericardial effusion is present. The pericardial effusion is posterior to the left ventricle. Mitral Valve: The mitral valve is abnormal. There is moderate thickening of the mitral valve leaflet(s). There is moderate calcification of the mitral valve leaflet(s). Moderate mitral annular calcification. Mild mitral valve regurgitation. No evidence of mitral valve stenosis. Tricuspid Valve: Significant pulmonary HTN based on TR velocity. The tricuspid valve is normal in structure. Tricuspid valve regurgitation is moderate . No evidence of tricuspid stenosis. Aortic Valve: CW across AV not done Manual trace from AR tracing shows stable mean gradient of 9 mmHg. The aortic valve is normal in structure. Aortic valve regurgitation is mild. Aortic regurgitation PHT measures 413 msec. Mild aortic stenosis is present. Pulmonic Valve: The pulmonic valve was normal in structure. Pulmonic valve regurgitation is mild. No evidence of pulmonic stenosis. Aorta: The aortic root is normal in size and structure. Venous: The inferior vena cava is dilated in size with less than 50% respiratory variability, suggesting right atrial pressure of 15 mmHg. IAS/Shunts: No atrial level shunt detected by color flow Doppler.  LEFT VENTRICLE PLAX 2D LVIDd:         4.20 cm LVIDs:         2.90 cm LV PW:         1.00 cm LV IVS:        0.90 cm LVOT diam:     1.70 cm LV SV:         57 LV SV Index:   31 LVOT Area:     2.27 cm  RIGHT VENTRICLE            IVC RV S prime:     8.38 cm/s  IVC diam: 2.50 cm TAPSE (M-mode): 1.5 cm LEFT ATRIUM             Index        RIGHT ATRIUM           Index LA diam:        3.50 cm 1.89 cm/m   RA Area:     20.60 cm LA Vol (A2C):   63.8 ml 34.40 ml/m  RA Volume:   61.80 ml  33.32 ml/m LA Vol (A4C):   57.5 ml 31.00 ml/m LA Biplane Vol: 61.6 ml 33.21 ml/m  AORTIC VALVE LVOT Vmax:   137.00 cm/s LVOT  Vmean:  85.900 cm/s LVOT VTI:    0.253 m AI PHT:      413 msec  AORTA Ao Root diam: 3.20 cm Ao Asc diam:  3.10 cm MITRAL VALVE               TRICUSPID VALVE MV Area (PHT): 2.83 cm    TR Peak grad:   85.4 mmHg MV Decel Time: 268 msec    TR Vmax:        462.00 cm/s MR Peak grad: 100.0 mmHg MR Mean grad: 57.0 mmHg    SHUNTS MR Vmax:      500.00 cm/s  Systemic VTI:  0.25 m MR Vmean:     354.0 cm/s   Systemic Diam: 1.70 cm MV E velocity: 79.40 cm/s MV A velocity: 93.00 cm/s MV E/A ratio:  0.85 Jenkins Rouge MD Electronically signed by Jenkins Rouge MD Signature Date/Time: 11/04/2021/10:35:02 AM    Final     Cardiac Studies   Echo as above.   Patient Profile     80 y.o.  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 May recommend torsemide homegoing. - discussed with pt that we will want to optimize volume over the weekend to determine if inpatient [facility] necessary on Monday or Tuesday. Will reassess her volume status tomorrow.  - on IV heparin for potential procedures, DOAC on hold. - continue metoprolol succ 12.5 mg daily.  - no chest pain, trops low and relatively flat. Likely demand ischemia.  PAF- -On IV heparin for potential procedures - maintaining SR.  - on metoprolol      For questions or updates, please  contact [place] Please consult [email or web address] for contact info under        Signed, Elouise Munroe, MD  11/05/2021, 9:28 AM

## 2021-11-05 NOTE — Progress Notes (Signed)
Mobility Specialist Progress Note:   11/05/21 1001  Mobility  Activity Ambulated in hall  Level of Assistance Standby assist, set-up cues, supervision of patient - no hands on  Assistive Device Four wheel walker  Distance Ambulated (ft) 350 ft  Mobility Ambulated with assistance in hallway  Mobility Response Tolerated well  Mobility performed by Mobility specialist  $Mobility charge 1 Mobility   Pt received in bed willing to participate in mobility. No complaints of pain. Pt required 1 seated break d/t being SOB. Pt left in bed with call bell in reach and all needs met.   [facility] Public librarian Phone [phone] Secondary Phone [phone]

## 2021-11-06 DIAGNOSIS — K5900 Constipation, unspecified: Secondary | ICD-10-CM

## 2021-11-06 DIAGNOSIS — I50811 Acute right heart failure: Secondary | ICD-10-CM

## 2021-11-06 DIAGNOSIS — I480 Paroxysmal atrial fibrillation: Secondary | ICD-10-CM | POA: Diagnosis not present

## 2021-11-06 DIAGNOSIS — I5032 Chronic diastolic (congestive) heart failure: Secondary | ICD-10-CM | POA: Diagnosis not present

## 2021-11-06 LAB — GLUCOSE, CAPILLARY
Glucose-Capillary: 171 mg/dL — ABNORMAL HIGH (ref 70–99)
Glucose-Capillary: 279 mg/dL — ABNORMAL HIGH (ref 70–99)
Glucose-Capillary: 105 mg/dL — ABNORMAL HIGH (ref 70–99)
Glucose-Capillary: 232 mg/dL — ABNORMAL HIGH (ref 70–99)

## 2021-11-06 LAB — CBC
HCT: 48 % — ABNORMAL HIGH (ref 36.0–46.0)
Hemoglobin: 15.5 g/dL — ABNORMAL HIGH (ref 12.0–15.0)
MCH: 33.8 pg (ref 26.0–34.0)
MCHC: 32.3 g/dL (ref 30.0–36.0)
MCV: 104.6 fL — ABNORMAL HIGH (ref 80.0–100.0)
Platelets: 112 10*3/uL — ABNORMAL LOW (ref 150–400)
RBC: 4.59 MIL/uL (ref 3.87–5.11)
RDW: 16 % — ABNORMAL HIGH (ref 11.5–15.5)
WBC: 7.3 10*3/uL (ref 4.0–10.5)
nRBC: 1.2 % — ABNORMAL HIGH (ref 0.0–0.2)

## 2021-11-06 LAB — HEPARIN LEVEL (UNFRACTIONATED): Heparin Unfractionated: 0.55 IU/mL (ref 0.30–0.70)

## 2021-11-06 LAB — BASIC METABOLIC PANEL
Anion gap: 8 (ref 5–15)
BUN: 32 mg/dL — ABNORMAL HIGH (ref 8–23)
CO2: 25 mmol/L (ref 22–32)
Calcium: 8.5 mg/dL — ABNORMAL LOW (ref 8.9–10.3)
Chloride: 102 mmol/L (ref 98–111)
Creatinine, Ser: 1.21 mg/dL — ABNORMAL HIGH (ref 0.44–1.00)
GFR, Estimated: 46 mL/min — ABNORMAL LOW (ref >60)
Glucose, Bld: 174 mg/dL — ABNORMAL HIGH (ref 70–99)
Potassium: 4.4 mmol/L (ref 3.5–5.1)
Sodium: 135 mmol/L (ref 135–145)

## 2021-11-06 LAB — MAGNESIUM: Magnesium: 2.5 mg/dL — ABNORMAL HIGH (ref 1.7–2.4)

## 2021-11-06 LAB — APTT: aPTT: 77 seconds — ABNORMAL HIGH (ref 24–36)

## 2021-11-06 MED ORDER — FAMOTIDINE 20 MG PO TABS
20.0000 mg | ORAL_TABLET | Freq: Two times a day (BID) | ORAL | Status: DC | PRN
  Administered 2021-11-06: 12:00:00 20 mg via ORAL
  Filled 2021-11-06: qty 1

## 2021-11-06 MED ORDER — SENNA 8.6 MG PO TABS
1.0000 | ORAL_TABLET | Freq: Every day | ORAL | Status: DC
  Administered 2021-11-06 – 2021-11-07 (×2): 8.6 mg via ORAL
  Filled 2021-11-06 (×2): qty 1

## 2021-11-06 MED ORDER — BISACODYL 10 MG RE SUPP
10.0000 mg | Freq: Every day | RECTAL | Status: DC | PRN
  Administered 2021-11-06: 14:00:00 10 mg via RECTAL
  Filled 2021-11-06: qty 1

## 2021-11-06 MED ORDER — POLYETHYLENE GLYCOL 3350 17 G PO PACK
17.0000 g | PACK | Freq: Two times a day (BID) | ORAL | Status: DC
  Administered 2021-11-06 – 2021-11-07 (×2): 17 g via ORAL
  Filled 2021-11-06 (×2): qty 1

## 2021-11-06 NOTE — Plan of Care (Signed)
  Problem: Education: Goal: Ability to demonstrate management of disease process will improve Outcome: Progressing Goal: Ability to verbalize understanding of medication therapies will improve Outcome: Progressing Goal: Individualized Educational Video(s) Outcome: Progressing   Problem: Activity: Goal: Capacity to carry out activities will improve Outcome: Progressing   Problem: Cardiac: Goal: Ability to achieve and maintain adequate cardiopulmonary perfusion will improve Outcome: Progressing   Problem: Education: Goal: Knowledge of General Education information will improve Description: Including pain rating scale, medication(s)/side effects and non-pharmacologic comfort measures Outcome: Progressing   Problem: Health Behavior/Discharge Planning: Goal: Ability to manage health-related needs will improve Outcome: Progressing   Problem: Clinical Measurements: Goal: Ability to maintain clinical measurements within normal limits will improve Outcome: Progressing Goal: Will remain free from infection Outcome: Progressing Goal: Diagnostic test results will improve Outcome: Progressing Goal: Respiratory complications will improve Outcome: Progressing Goal: Cardiovascular complication will be avoided Outcome: Progressing   Problem: Activity: Goal: Risk for activity intolerance will decrease Outcome: Progressing   Problem: Coping: Goal: Level of anxiety will decrease Outcome: Progressing   Problem: Elimination: Goal: Will not experience complications related to bowel motility Outcome: Progressing Goal: Will not experience complications related to urinary retention Outcome: Progressing   Problem: Pain Managment: Goal: General experience of comfort will improve Outcome: Progressing   Problem: Safety: Goal: Ability to remain free from injury will improve Outcome: Progressing   Problem: Skin Integrity: Goal: Risk for impaired skin integrity will decrease Outcome:  Progressing

## 2021-11-06 NOTE — Hospital Course (Addendum)
Elizabeth Melton is a 80 y.o. F with pulmonary fibrosis on 3L home O2, dCHF, hx PE on Eliquis, DM, HTN, and hypothyroidism who presented with abdominal distention/pain for a week or so with shortness of breath and constipation.  She was incidentally noted to have elevated troponins.  Cardiology were consulted.   11/25: Echo showed reduced RVSF and increased est. PA pressures 11/25 - 11/28: Underwent IV diuresis and symptoms improved to baseline

## 2021-11-06 NOTE — Progress Notes (Signed)
Progress Note  Patient Name: Elizabeth Melton Date of Encounter: 11/06/2021  Primary Cardiologist: [name], MD   Subjective   Ready to go home. Feels well. We discussed one additional Elizabeth of diuresis.   Inpatient Medications    Scheduled Meds:  buPROPion  150 mg Oral Daily   docusate sodium  100 mg Oral BID   furosemide  80 mg Intravenous BID   insulin aspart  0-15 Units Subcutaneous TID WC   insulin aspart  0-5 Units Subcutaneous QHS   levothyroxine  75 mcg Oral QAC breakfast   metoprolol succinate  12.5 mg Oral Daily   nicotine  14 mg Transdermal Daily   polyethylene glycol  17 g Oral Daily   pravastatin  40 mg Oral q1800   Continuous Infusions:  heparin 1,100 Units/hr (11/05/21 0809)   PRN Meds: acetaminophen, ondansetron (ZOFRAN) IV   Vital Signs    Vitals:   11/05/21 1945 11/05/21 2340 11/06/21 0434 11/06/21 0628  BP: (!) 137/57 137/70 (!) 113/55   Pulse: 60 61 (!) 51   Resp: _0 Temp: 97.9 F (36.6 C) 97.9 F (36.6 C) 97.8 F (36.6 C)   TempSrc: Oral Oral Oral   SpO2: 98% 100% 100%   Weight:    74.8 kg  Height:        Intake/Output Summary (Last 24 hours) at 11/06/2021 0640 Last data filed at 11/05/2021 2341 Gross per 24 hour  Intake 785.5 ml  Output 1950 ml  Net -1164.5 ml   Filed Weights   11/03/21 1115 11/05/21 0534 11/06/21 0628  Weight: 76 kg 74.9 kg 74.8 kg    Telemetry    Sinus rhythm, occasional PVCs - Personally Reviewed  ECG    SR, RAE - Personally Reviewed  Physical Exam   GEN: No acute distress.   Neck: JVD with prominent V wave, at 90 deg JVP to level of clavicle Cardiac: regular rhythm, normal rate, 2/6 HSM across precordium, no rubs, or gallops.  Respiratory: clear GI: Soft, nontender, non-distended  MS: No edema; No deformity. Neuro:  Nonfocal  Psych: Normal affect   Labs    Chemistry Recent Labs  Lab 11/02/21 2109 11/04/21 0903 11/05/21 0122 11/06/21 0104  NA 139 135 133* 135  K  4.4 4.5 3.9 4.4  CL 106 102 100 102  CO2 _1 GLUCOSE 120* 198* 247* 174*  BUN 28* 34* 42* 32*  CREATININE 1.29* 1.51* 1.40* 1.21*  CALCIUM 9.6 8.9 8.3* 8.5*  PROT 7.8  --   --   --   ALBUMIN 3.5  --   --   --   AST 27  --   --   --   ALT 21  --   --   --   ALKPHOS 49  --   --   --   BILITOT 1.3*  --   --   --   GFRNONAA 42* 35* 38* 46*  ANIONGAP _2 Hematology Recent Labs  Lab 11/04/21 0116 11/05/21 0122 11/06/21 0104  WBC 8.9 8.2 7.3  RBC 4.29 4.28 4.59  HGB 14.9 14.7 15.5*  HCT 44.7 44.3 48.0*  MCV 104.2* 103.5* 104.6*  MCH 34.7* 34.3* 33.8  MCHC 33.3 33.2 32.3  RDW 16.3* 16.1* 16.0*  PLT 128* 113* 112*    Cardiac EnzymesNo results for input(s): TROPONINI in the last 168 hours. No results for input(s): TROPIPOC in the last 168  hours.   BNP Recent Labs  Lab 11/03/21 1139  BNP 631.9*     DDimer No results for input(s): DDIMER in the last 168 hours.   Radiology    ECHOCARDIOGRAM COMPLETE  Result Date: 11/04/2021    ECHOCARDIOGRAM REPORT   Patient Name:   Elizabeth Melton Date of Exam: 11/04/2021 Medical Rec #:  [id number]          Height:       66.0 in Accession #:    [id number]         Weight:       167.5 lb Date of Birth:  Oct 31, 1941          BSA:          1.855 m Patient Age:    6 years           BP:           126/62 mmHg Patient Gender: F                  HR:           76 bpm. Exam Location:  Inpatient Procedure: 2D Echo Indications:    acute diastolic chf  History:        Patient has prior history of Echocardiogram examinations, most                 recent 10/05/2021. Signs/Symptoms:elevated troponin; Risk                 Factors:Current Smoker, Diabetes, Hypertension and Dyslipidemia.  Sonographer:    Johny Chess RDCS Referring Phys: [id number] [place]  1. Left ventricular ejection fraction, by estimation, is 60 to 65%. The left ventricle has normal function. The left ventricle has no regional wall motion  abnormalities. Left ventricular diastolic parameters were normal.  2. Right ventricular systolic function is mildly reduced. The right ventricular size is mildly enlarged. There is severely elevated pulmonary artery systolic pressure.  3. Left atrial size was mildly dilated.  4. Right atrial size was moderately dilated.  5. The pericardial effusion is posterior to the left ventricle.  6. The mitral valve is abnormal. Mild mitral valve regurgitation. No evidence of mitral stenosis. Moderate mitral annular calcification.  7. Significant pulmonary HTN based on TR velocity . Tricuspid valve regurgitation is moderate.  8. CW across AV not done Manual trace from AR tracing shows stable mean gradient of 9 mmHg . The aortic valve is normal in structure. Aortic valve regurgitation is mild. Mild aortic valve stenosis.  9. The inferior vena cava is dilated in size with <50% respiratory variability, suggesting right atrial pressure of 15 mmHg. FINDINGS  Left Ventricle: Left ventricular ejection fraction, by estimation, is 60 to 65%. The left ventricle has normal function. The left ventricle has no regional wall motion abnormalities. The left ventricular internal cavity size was normal in size. There is  no left ventricular hypertrophy. Left ventricular diastolic parameters were normal. Right Ventricle: The right ventricular size is mildly enlarged. Right vetricular wall thickness was not assessed. Right ventricular systolic function is mildly reduced. There is severely elevated pulmonary artery systolic pressure. The tricuspid regurgitant velocity is 4.62 m/s, and with an assumed right atrial pressure of 10 mmHg, the estimated right ventricular systolic pressure is 83.0 mmHg. Left Atrium: Left atrial size was mildly dilated. Right Atrium: Right atrial size was moderately dilated. Pericardium: Trivial pericardial effusion is present. The pericardial effusion is posterior to the left ventricle. Mitral  Valve: The mitral valve is  abnormal. There is moderate thickening of the mitral valve leaflet(s). There is moderate calcification of the mitral valve leaflet(s). Moderate mitral annular calcification. Mild mitral valve regurgitation. No evidence of mitral valve stenosis. Tricuspid Valve: Significant pulmonary HTN based on TR velocity. The tricuspid valve is normal in structure. Tricuspid valve regurgitation is moderate . No evidence of tricuspid stenosis. Aortic Valve: CW across AV not done Manual trace from AR tracing shows stable mean gradient of 9 mmHg. The aortic valve is normal in structure. Aortic valve regurgitation is mild. Aortic regurgitation PHT measures 413 msec. Mild aortic stenosis is present. Pulmonic Valve: The pulmonic valve was normal in structure. Pulmonic valve regurgitation is mild. No evidence of pulmonic stenosis. Aorta: The aortic root is normal in size and structure. Venous: The inferior vena cava is dilated in size with less than 50% respiratory variability, suggesting right atrial pressure of 15 mmHg. IAS/Shunts: No atrial level shunt detected by color flow Doppler.  LEFT VENTRICLE PLAX 2D LVIDd:         4.20 cm LVIDs:         2.90 cm LV PW:         1.00 cm LV IVS:        0.90 cm LVOT diam:     1.70 cm LV SV:         57 LV SV Index:   31 LVOT Area:     2.27 cm  RIGHT VENTRICLE            IVC RV S prime:     8.38 cm/s  IVC diam: 2.50 cm TAPSE (M-mode): 1.5 cm LEFT ATRIUM             Index        RIGHT ATRIUM           Index LA diam:        3.50 cm 1.89 cm/m   RA Area:     20.60 cm LA Vol (A2C):   63.8 ml 34.40 ml/m  RA Volume:   61.80 ml  33.32 ml/m LA Vol (A4C):   57.5 ml 31.00 ml/m LA Biplane Vol: 61.6 ml 33.21 ml/m  AORTIC VALVE LVOT Vmax:   137.00 cm/s LVOT Vmean:  85.900 cm/s LVOT VTI:    0.253 m AI PHT:      413 msec  AORTA Ao Root diam: 3.20 cm Ao Asc diam:  3.10 cm MITRAL VALVE               TRICUSPID VALVE MV Area (PHT): 2.83 cm    TR Peak grad:   85.4 mmHg MV Decel Time: 268 msec    TR Vmax:         462.00 cm/s MR Peak grad: 100.0 mmHg MR Mean grad: 57.0 mmHg    SHUNTS MR Vmax:      500.00 cm/s  Systemic VTI:  0.25 m MR Vmean:     354.0 cm/s   Systemic Diam: 1.70 cm MV E velocity: 79.40 cm/s MV A velocity: 93.00 cm/s MV E/A ratio:  0.85 Jenkins Rouge MD Electronically signed by Jenkins Rouge MD Signature Date/Time: 11/04/2021/10:35:02 AM    Final     Cardiac Studies   Echo as above.   Patient Profile     80 y.o. 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 May recommend torsemide tomorrow for homegoing. - discussed with pt that we will want to optimize volume over the weekend to determine if inpatient [facility] necessary on Monday or Tuesday. Will reassess her volume status tomorrow, however I suspect patient may be able to be discharged tomorrow with outpatient consideration for cath if worsening symptoms.  - on IV heparin for potential procedures, DOAC on hold, resume at DC. - continue metoprolol succ 12.5 mg daily.  - no chest pain, trops low and relatively flat. Likely demand ischemia.  PAF- -On IV heparin for potential procedures - maintaining SR.  - on metoprolol      For questions or updates, please contact [place] Please consult [email or web address] for contact info under        Signed, Elouise Munroe, MD   11/06/2021, 6:40 AM

## 2021-11-06 NOTE — Assessment & Plan Note (Signed)
-  Continue heparin gtt -Continue metoprolol

## 2021-11-06 NOTE — Assessment & Plan Note (Signed)
-  Continue levothyroxine

## 2021-11-06 NOTE — Plan of Care (Signed)
  Problem: Activity: Goal: Capacity to carry out activities will improve Outcome: Progressing   Problem: Education: Goal: Knowledge of General Education information will improve Description: Including pain rating scale, medication(s)/side effects and non-pharmacologic comfort measures Outcome: Progressing   Problem: Activity: Goal: Risk for activity intolerance will decrease Outcome: Progressing   Problem: Coping: Goal: Level of anxiety will decrease Outcome: Progressing   Problem: Nutrition: Goal: Adequate nutrition will be maintained Outcome: Not Applicable

## 2021-11-06 NOTE — Assessment & Plan Note (Signed)
-  Dulcolax suppository then increase Miralax and Senna frequency

## 2021-11-06 NOTE — Assessment & Plan Note (Signed)
Cr improving.  Net negative 1.6L yesterday, 3L on admission.  -Continue Furosemide 80 mg IV twice a day  -Strict I/Os, daily weights, telemetry  -Daily monitoring renal function -Consult Cardiology, appreciate cares

## 2021-11-06 NOTE — Assessment & Plan Note (Signed)
-  Continue metoprolol 

## 2021-11-06 NOTE — Progress Notes (Signed)
Progress Note    Elizabeth Melton   VPC:[id number]  DOB: [birth year]  DOA: 11/02/2021     2 Date of Service: 11/06/2021     Brief summary: Elizabeth Melton is a 80 y.o. F with pulmonary fibrosis on 3L home O2, dCHF, hx PE on Eliquis, DM, HTN, and hypothyroidism who presented with abdominal distention/pain for a week or so with shortness of breath and constipation.  She was incidentally noted to have elevated troponins.  Cardiology were consulted who started diuresis.     Assessment and Plan * Acute right-sided CHF (congestive heart failure) (HCC) Cr improving.  Net negative 1.6L yesterday, 3L on admission.  -Continue Furosemide 80 mg IV twice a day  -Strict I/Os, daily weights, telemetry  -Daily monitoring renal function -Consult Cardiology, appreciate cares    Elevated troponin  Suspect just demand supply mismatch  AF (paroxysmal atrial fibrillation) (HCC) -Continue heparin gtt -Continue metoprolol  Chronic diastolic heart failure (HCC)    ILD (interstitial lung disease) (HCC)    Diabetes mellitus without complication (HCC) Glucose normal. -Continue SS corrections -Continue pravastatin  Essential (primary) hypertension -Continue metoprolol  Hypothyroidism -Continue levothyroxine  Tobacco abuse    Constipation - Dulcolax suppository then increase Miralax and Senna frequency     Subjective:  Still feels distended, bloated, constipated.  No hemoptysis, fever, sputum.  Objective Vitals:   11/06/21 0628 11/06/21 0800 11/06/21 1114 11/06/21 1529  BP:  (!) 135/54 (!) 136/52 (!) 131/53  Pulse:  64 61 62  Resp:  16 (!) 23 (!) 21  Temp:  97.8 F (36.6 C) 98.4 F (36.9 C) 98.4 F (36.9 C)  TempSrc:  Oral Oral Oral  SpO2:  96% 99% 98%  Weight: 74.8 kg     Height:       74.8 kg  Vital signs were reviewed and unremarkable.   Exam Physical Exam Constitutional:      General: She is not in acute distress. HENT:     Nose: No nasal deformity or  rhinorrhea.     Comments: Nasal cannula in place    Mouth/Throat:     Lips: Pink. No lesions.     Mouth: Mucous membranes are moist. No oral lesions.     Dentition: Normal dentition.     Pharynx: Oropharynx is clear. No posterior oropharyngeal erythema.  Eyes:     General: Lids are normal. Gaze aligned appropriately.     Extraocular Movements: Extraocular movements intact.     Conjunctiva/sclera: Conjunctivae normal.  Cardiovascular:     Rate and Rhythm: Normal rate and regular rhythm.     Pulses:          Radial pulses are 2+ on the right side and 2+ on the left side.     Heart sounds: Normal heart sounds, S1 normal and S2 normal. No murmur heard. Pulmonary:     Effort: Pulmonary effort is normal. No respiratory distress.     Breath sounds: No wheezing or rales.  Abdominal:     General: There is no distension.     Palpations: Abdomen is soft.     Tenderness: There is no abdominal tenderness. There is no guarding or rebound.  Musculoskeletal:     Right lower leg: No edema.     Left lower leg: No edema.  Skin:    General: Skin is warm and dry.     Findings: No lesion or rash.  Neurological:     Mental Status: She is alert and oriented to person, place,  and time.     Cranial Nerves: Cranial nerves are intact.     Motor: No weakness.  Psychiatric:        Attention and Perception: Attention normal.        Mood and Affect: Mood and affect normal.        Behavior: Behavior is cooperative.        Cognition and Memory: Memory normal.        Judgment: Judgment normal.       Labs / Other Information My review of labs, imaging, notes and other tests is significant for Creatinine improved down to 1.2, glucose normal, hemoglobin stable 15, platelets stable 112.     Disposition Plan: Status is: Inpatient  Remains inpatient appropriate because: Still requiring IV Lasix.  Cardiology suspect that she is close to euvolemic, will likely transition to oral diuretics tomorrow, and plan  for discharge home at that time. Spoke to daughter by phone       Time spent: 35 minutes [facility] 11/06/2021, 4:24 PM

## 2021-11-06 NOTE — Progress Notes (Addendum)
ANTICOAGULATION CONSULT NOTE  Pharmacy Consult for heparin Indication: chest pain/ACS  Allergies  Allergen Reactions   Lisinopril     Other reaction(s): cough 09/20/17   Losartan Other (See Comments)    Hallucinations     Patient Measurements: Height: _0  (167.6 cm) Weight: 74.8 kg (164 lb 14.5 oz) IBW/kg (Calculated) : 59.3 Heparin Dosing Weight: 76kg  Vital Signs: Temp: 97.8 F (36.6 C) (11/27 0800) Temp Source: Oral (11/27 0800) BP: 135/54 (11/27 0800) Pulse Rate: 64 (11/27 0800)  Labs: Recent Labs    11/03/21 1139 11/03/21 1404 11/03/21 2004 11/04/21 0116 11/04/21 0903 11/05/21 0122 11/05/21 1532 11/06/21 0104  HGB  --   --    < > 14.9  --  14.7  --  15.5*  HCT  --   --   --  44.7  --  44.3  --  48.0*  PLT  --   --   --  128*  --  113*  --  112*  APTT  --   --    < > 58* 69* 76*  --  77*  HEPARINUNFRC  --   --    < > 0.97*  --  0.53 0.57 0.55  CREATININE  --   --   --   --  1.51* 1.40*  --  1.21*  TROPONINIHS 75* 57*  --   --   --   --   --   --    < > = values in this interval not displayed.     Estimated Creatinine Clearance: 39 mL/min (A) (by C-G formula based on SCr of 1.21 mg/dL (H)).  Assessment: 45 yoF on apixaban PTA for hx PE with concern for ACS. Apixaban to transition to IV heparin for ischemic workup. Possible plan for left and right heart cath. Last apixaban dose was PTA 11/23 ~0800.   Heparin level therapeutic at 0.55 on heparin drip 1100 uts/hr Hemoglobin and plts stable. Plts on lower end at 112, will continue to monitor. No issues with line or bleeding reported per RN.  Goal of Therapy:  Heparin level 0.3-0.7 units/ml aPTT 66-102 seconds Monitor platelets by anticoagulation protocol: Yes   Plan:  Continue heparin at 1100 units/hr HL and CBC daily while on heparin Follow-up transition back to [place], PharmD PGY2 Cardiology Pharmacy Resident Phone: [phone]  11/06/2021  10:43 AM  Please check  [email or web address] for unit-specific pharmacy phone numbers.     Please check [email or web address] for unit-specific pharmacy phone numbers.

## 2021-11-06 NOTE — Assessment & Plan Note (Addendum)
Glucose normal. -Continue SS corrections -Continue pravastatin

## 2021-11-07 ENCOUNTER — Other Ambulatory Visit (HOSPITAL_COMMUNITY): Payer: Self-pay

## 2021-11-07 DIAGNOSIS — I50811 Acute right heart failure: Secondary | ICD-10-CM | POA: Diagnosis not present

## 2021-11-07 DIAGNOSIS — K5900 Constipation, unspecified: Secondary | ICD-10-CM | POA: Diagnosis not present

## 2021-11-07 DIAGNOSIS — I480 Paroxysmal atrial fibrillation: Secondary | ICD-10-CM | POA: Diagnosis not present

## 2021-11-07 DIAGNOSIS — I5032 Chronic diastolic (congestive) heart failure: Secondary | ICD-10-CM | POA: Diagnosis not present

## 2021-11-07 LAB — HEPARIN LEVEL (UNFRACTIONATED): Heparin Unfractionated: 0.37 IU/mL (ref 0.30–0.70)

## 2021-11-07 LAB — CBC
HCT: 49.8 % — ABNORMAL HIGH (ref 36.0–46.0)
Hemoglobin: 16 g/dL — ABNORMAL HIGH (ref 12.0–15.0)
MCH: 33.5 pg (ref 26.0–34.0)
MCHC: 32.1 g/dL (ref 30.0–36.0)
MCV: 104.4 fL — ABNORMAL HIGH (ref 80.0–100.0)
Platelets: 133 10*3/uL — ABNORMAL LOW (ref 150–400)
RBC: 4.77 MIL/uL (ref 3.87–5.11)
RDW: 15.7 % — ABNORMAL HIGH (ref 11.5–15.5)
WBC: 8.1 10*3/uL (ref 4.0–10.5)
nRBC: 0.6 % — ABNORMAL HIGH (ref 0.0–0.2)

## 2021-11-07 LAB — HEMOGLOBIN A1C
Hgb A1c MFr Bld: 7.6 % — ABNORMAL HIGH (ref 4.8–5.6)
Mean Plasma Glucose: 171 mg/dL

## 2021-11-07 LAB — BRAIN NATRIURETIC PEPTIDE: B Natriuretic Peptide: 234.1 pg/mL — ABNORMAL HIGH (ref 0.0–100.0)

## 2021-11-07 LAB — GLUCOSE, CAPILLARY
Glucose-Capillary: 193 mg/dL — ABNORMAL HIGH (ref 70–99)
Glucose-Capillary: 185 mg/dL — ABNORMAL HIGH (ref 70–99)

## 2021-11-07 LAB — BASIC METABOLIC PANEL
Anion gap: 7 (ref 5–15)
BUN: 32 mg/dL — ABNORMAL HIGH (ref 8–23)
CO2: 25 mmol/L (ref 22–32)
Calcium: 8.6 mg/dL — ABNORMAL LOW (ref 8.9–10.3)
Chloride: 101 mmol/L (ref 98–111)
Creatinine, Ser: 1.3 mg/dL — ABNORMAL HIGH (ref 0.44–1.00)
GFR, Estimated: 42 mL/min — ABNORMAL LOW (ref >60)
Glucose, Bld: 181 mg/dL — ABNORMAL HIGH (ref 70–99)
Potassium: 4.3 mmol/L (ref 3.5–5.1)
Sodium: 133 mmol/L — ABNORMAL LOW (ref 135–145)

## 2021-11-07 MED ORDER — BISACODYL 10 MG RE SUPP
10.0000 mg | Freq: Every day | RECTAL | 0 refills | Status: DC | PRN
  Filled 2021-11-07: qty 12, 12d supply, fill #0

## 2021-11-07 MED ORDER — APIXABAN 5 MG PO TABS
5.0000 mg | ORAL_TABLET | Freq: Two times a day (BID) | ORAL | Status: DC
  Administered 2021-11-07: 12:00:00 5 mg via ORAL
  Filled 2021-11-07: qty 1

## 2021-11-07 MED ORDER — TORSEMIDE 20 MG PO TABS
40.0000 mg | ORAL_TABLET | Freq: Every day | ORAL | 3 refills | Status: DC
  Filled 2021-11-07: qty 60, 30d supply, fill #0

## 2021-11-07 MED ORDER — TORSEMIDE 20 MG PO TABS
40.0000 mg | ORAL_TABLET | Freq: Every day | ORAL | Status: DC
  Administered 2021-11-07: 12:00:00 40 mg via ORAL
  Filled 2021-11-07: qty 2

## 2021-11-07 NOTE — Assessment & Plan Note (Signed)
-  Continue levothyroxine

## 2021-11-07 NOTE — Assessment & Plan Note (Signed)
-  Dulcolax suppository then increase Miralax and Senna frequency 

## 2021-11-07 NOTE — Progress Notes (Signed)
Progress Note  Patient Name: Elizabeth Melton Date of Encounter: 11/07/2021  Primary Cardiologist: [name], MD   Subjective   BNP has improved from 600 to 200.  Patient feels back to normal.  She expressed a plan to quit smoking.  No CP or SOB.  Still feels constipation.  Inpatient Medications    Scheduled Meds:  buPROPion  150 mg Oral Daily   docusate sodium  100 mg Oral BID   insulin aspart  0-15 Units Subcutaneous TID WC   insulin aspart  0-5 Units Subcutaneous QHS   levothyroxine  75 mcg Oral QAC breakfast   metoprolol succinate  12.5 mg Oral Daily   nicotine  14 mg Transdermal Daily   polyethylene glycol  17 g Oral BID   pravastatin  40 mg Oral q1800   senna  1 tablet Oral Daily   Continuous Infusions:  heparin 1,100 Units/hr (11/07/21 0641)   PRN Meds: acetaminophen, bisacodyl, famotidine, ondansetron (ZOFRAN) IV   Vital Signs    Vitals:   11/06/21 1928 11/06/21 2354 11/07/21 0450 11/07/21 0742  BP: (!) 163/50 (!) 129/49 130/69 (!) 139/47  Pulse: 66 (!) 58 60 60  Resp: _0 Temp: 98.6 F (37 C) 98.4 F (36.9 C) 98.3 F (36.8 C) 98.6 F (37 C)  TempSrc: Oral Oral Oral Oral  SpO2: 98% 96% 98% 97%  Weight:   74.4 kg   Height:        Intake/Output Summary (Last 24 hours) at 11/07/2021 0803 Last data filed at 11/07/2021 0744 Gross per 24 hour  Intake 360 ml  Output 1550 ml  Net -1190 ml   Filed Weights   11/05/21 0534 11/06/21 0628 11/07/21 0450  Weight: 74.9 kg 74.8 kg 74.4 kg    Telemetry    SR and sinus brady with PVCs and PACs, VTACH is artifactual - Personally Reviewed  ECG    No new last 24 - Personally Reviewed  Physical Exam   Gen: no distress, elderly female Neck: Prominent JVD with low nadir at 90 degrees Cardiac: No Rubs or Gallops, distant heart sounds but soft systolc Murmur, normal rate +2 radial pulses Respiratory: Coarse breath sounds bilaterally with rhonchi and expiratory wheezes GI: Soft,  nontender, non-distended MS: bilateral non pitting edema;  moves all extremities Integument: Skin feels warm Neuro:  At time of evaluation, alert and oriented to person/place/time/situation  Psych: Normal affect, patient feels well   Labs    Chemistry Recent Labs  Lab 11/02/21 2109 11/04/21 0903 11/05/21 0122 11/06/21 0104 11/07/21 0101  NA 139   < > 133* 135 133*  K 4.4   < > 3.9 4.4 4.3  CL 106   < > 100 102 101  CO2 24   < > _1 GLUCOSE 120*   < > 247* 174* 181*  BUN 28*   < > 42* 32* 32*  CREATININE 1.29*   < > 1.40* 1.21* 1.30*  CALCIUM 9.6   < > 8.3* 8.5* 8.6*  PROT 7.8  --   --   --   --   ALBUMIN 3.5  --   --   --   --   AST 27  --   --   --   --   ALT 21  --   --   --   --   ALKPHOS 49  --   --   --   --   BILITOT 1.3*  --   --   --   --  GFRNONAA 42*   < > 38* 46* 42*  ANIONGAP 9   < > _0 < > = values in this interval not displayed.     Hematology Recent Labs  Lab 11/05/21 0122 11/06/21 0104 11/07/21 0101  WBC 8.2 7.3 8.1  RBC 4.28 4.59 4.77  HGB 14.7 15.5* 16.0*  HCT 44.3 48.0* 49.8*  MCV 103.5* 104.6* 104.4*  MCH 34.3* 33.8 33.5  MCHC 33.2 32.3 32.1  RDW 16.1* 16.0* 15.7*  PLT 113* 112* 133*    Cardiac EnzymesNo results for input(s): TROPONINI in the last 168 hours. No results for input(s): TROPIPOC in the last 168 hours.   BNP Recent Labs  Lab 11/03/21 1139 11/07/21 0101  BNP 631.9* 234.1*     DDimer No results for input(s): DDIMER in the last 168 hours.   Radiology    No results found.  Cardiac Studies   Echo 11/04/21:  EF preserved, Mild AS, MD, TR, RV failure with pericardial effusion Last Chest CT:  Aortic Athero, AV calcium, MAC, and LAD CAC  Patient Profile     80 y.o. female IPF on 3 L Home O2, HFpEF, PAF CHASDVASC of 6 and PE on eliquis (heparin through admission) HTN with DM and Aortic atherosclerosis who presented with   Assessment & Plan    Pulmonary Hypertension IPF on 3 L-4 O2 Current Tobacco   HFpEF and at least Mild AS PAF CHADSVASC 6 Hx of PE on eliquis HTN with DM Mild LAD CAC and Aortic Atherosclerosis - WHO Functional Class II, Stage C, slightly hypervolemic, WHO II/II mixed picture in the setting of AS, HFpEF, IPF, and prolonged prior tobacco hx - Diuretic regimen: Transition to torsemide 40 mg PO daily (form lasix 40 mg PO Daily at home), has  - Fluid restriction of < 2 L  - Unlike 01/20/21 no significant troponin elevation; likely will defer LHC at this time - Last RHC: 7/22  TPG 1, mean PAP 36 mm HG and PCWP 28   - Pulm/AHF: Dr. [name] last seen 07/2021 (had deferred antifibrotics per last note) - I have offered her repeat RHC: she things that optimizing medication would be the next best step which is reasonable - return to eliquis - we have discussed smoking cessation and red flags of fluid return,  - presently on lovastatin 40 mg PO nightly, LDL goal < 70  [facility] will sign off.   Medication Recommendations:  Torsemide 40 mg PO daily and Klor-Con 10, Home BB, return to eliquis, continued nicotine replacement therapy Other recommendations (labs, testing, etc):  Follow up BMP i7-10 days Follow up as an outpatient:  Has 11/18/21 AHF f/u, if GOC change may need sooner pulm f/u   For questions or updates, please contact [place] Please consult [email or web address] for contact info under Cardiology/STEMI.      Signed, Werner Lean, MD  11/07/2021, 8:03 AM

## 2021-11-07 NOTE — Assessment & Plan Note (Signed)
-  Continue metoprolol 

## 2021-11-07 NOTE — Assessment & Plan Note (Signed)
-  Continue heparin gtt -Continue metoprolol

## 2021-11-07 NOTE — Consult Note (Signed)
   [facility] CM Inpatient Consult   11/07/2021  ENYLA LISBON 11-10-1941 [id number]  [place] Organization [ACO] Patient: [name]  Primary Care Provider:  Kathyrn Lass, MD [facility] is an Embedded provider.  Patient is currently active with [facility] for chronic disease management services.  Patient has been engaged by a [facility].    Patient will receive a post hospital call and will be evaluated for assessments and disease process education.    Plan:  No additional needs as patient will be followed by the [place] Coordinator. Provider does the [facility] calls and follow up appointment.  Patient has access to CCM Pharmacist at provider practice if needed.   Of note, [facility] Care Management services does not replace or interfere with any services that are needed or arranged by inpatient [facility] care management team.  For additional questions or referrals please contact:  Natividad Brood, RN BSN [facility] Liaison  [phone] business mobile phone Toll free office [phone]  Fax number: [phone] [email or web address]

## 2021-11-07 NOTE — Care Management Important Message (Signed)
Important Message  Patient Details  Name: Elizabeth Melton MRN: [id number] Date of Birth: 04-23-1941   Medicare Important Message Given:  Yes     Orbie Pyo 11/07/2021, 2:42 PM  Patient left prior to IM delivery will mail to the patient home address.

## 2021-11-07 NOTE — Progress Notes (Signed)
Mobility Specialist Progress Note    11/07/21 0959  Mobility  Activity Ambulated in hall  Level of Assistance Modified independent, requires aide device or extra time  Assistive Device Four wheel walker  Distance Ambulated (ft) 340 ft  Mobility Ambulated independently in hallway  Mobility Response Tolerated well  Mobility performed by Mobility specialist  Bed Position Chair  $Mobility charge 1 Mobility   Pre-Mobility: 65 HR Post-Mobility: 75 HR  Pt received in bed and agreeable. No complaints on walk. Ambulated on 4LO2. Returned to chair with call bell in reach.  [facility] Mobility Specialist  M.S. Primary Phone: [phone] M.S. Secondary Phone: [phone]

## 2021-11-07 NOTE — Progress Notes (Signed)
Physical Therapy Treatment Patient Details Name: Elizabeth Melton MRN: [id number] DOB: 1941-11-07 Today's Date: 11/07/2021   History of Present Illness Elizabeth Melton is a 80 y.o. 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 Found to have elevated troponins and constipation.    PT Comments    Patient progressing with mobility able to negotiate steps this session despite multiple lines and O2 needs.  She demonstrates good safety awareness despite multiple lines and balance is good enough for her to attempt to manage them as well.  Will need supplemental O2 at home, but no follow up PT needs at this time.  Will follow up if not d/c today.    Recommendations for follow up therapy are one component of a multi-disciplinary discharge planning process, led by the attending physician.  Recommendations may be updated based on patient status, additional functional criteria and insurance authorization.  Follow Up Recommendations  No PT follow up     Assistance Recommended at Discharge    Equipment Recommendations  None recommended by PT    Recommendations for Other Services       Precautions / Restrictions Precautions Precautions: Fall Precaution Comments: O2 dependent     Mobility  Bed Mobility               General bed mobility comments: in recliner    Transfers   Equipment used: Rollator (4 wheels) Transfers: Sit to/from Stand Sit to Stand: Modified independent (Device/Increase time)           General transfer comment: assist for lines and pt trying to manage them as well, but no physical help needed    Ambulation/Gait Ambulation/Gait assistance: Supervision Gait Distance  (Feet): 200 Feet Assistive device: Rollator (4 wheels) Gait Pattern/deviations: Step-through pattern;Decreased stride length       General Gait Details: off unit to practice steps and back, pt not sitting to rest, but stood briefly after stairs, maintained on 4L O2 throughout   Stairs Stairs: Yes Stairs assistance: Supervision Stair Management: Step to pattern;Alternating pattern;Forwards;One rail Right;One rail Left Number of Stairs: 4 (then 3 steps, limited by lines) General stair comments: alternating to ascend, step to favoring L knee to descend   Wheelchair Mobility    Modified Rankin (Stroke Patients Only)       Balance Overall balance assessment: Needs assistance   Sitting balance-Leahy Scale: Good       Standing balance-Leahy Scale: Fair                              Cognition Arousal/Alertness: Awake/alert Behavior During Therapy: WFL for tasks assessed/performed Overall Cognitive Status: Within Functional Limits for tasks assessed                                          Exercises      General Comments General comments (skin integrity, edema, etc.): VSS today on 3-4L O2 with ambulation      Pertinent Vitals/Pain Faces Pain Scale: Hurts little more Pain Location: L knee Pain Descriptors / Indicators: Aching Pain Intervention(s): Monitored during session    Home  Living                          Prior Function            PT Goals (current goals can now be found in the care plan section) Progress towards PT goals: Progressing toward goals    Frequency    Min 3X/week      PT Plan Current plan remains appropriate    Co-evaluation              AM-PAC PT "6 Clicks" Mobility   Outcome Measure  Help needed turning from your back to your side while in a flat bed without using bedrails?: None Help needed moving from lying on your back to sitting on the side of a flat bed without using bedrails?:  None Help needed moving to and from a bed to a chair (including a wheelchair)?: None Help needed standing up from a chair using your arms (e.g., wheelchair or bedside chair)?: None Help needed to walk in hospital room?: A Little Help needed climbing 3-5 steps with a railing? : A Little 6 Click Score: 22    End of Session Equipment Utilized During Treatment: Oxygen Activity Tolerance: Patient tolerated treatment well Patient left: in chair;with call bell/phone within reach   PT Visit Diagnosis: Difficulty in walking, not elsewhere classified (R26.2)     Time: 5910-2890 PT Time Calculation (min) (ACUTE ONLY): 25 min  Charges:  $Gait Training: 8-22 mins $Therapeutic Activity: 8-22 mins                     Magda Kiel, PT Acute Rehabilitation Services Pager:[phone] [phone] 11/07/2021    Reginia Naas 11/07/2021, 1:40 PM

## 2021-11-07 NOTE — Progress Notes (Addendum)
ANTICOAGULATION CONSULT NOTE  Pharmacy Consult for heparin Indication: chest pain/ACS  Allergies  Allergen Reactions   Lisinopril     Other reaction(s): cough 09/20/17   Losartan Other (See Comments)    Hallucinations     Patient Measurements: Height: _0  (167.6 cm) Weight: 74.4 kg (164 lb 0.4 oz) IBW/kg (Calculated) : 59.3 Heparin Dosing Weight: 76kg  Vital Signs: Temp: 98.3 F (36.8 C) (11/28 0450) Temp Source: Oral (11/28 0450) BP: 130/69 (11/28 0450) Pulse Rate: 60 (11/28 0450)  Labs: Recent Labs    11/04/21 0903 11/04/21 0903 11/05/21 0122 11/05/21 1532 11/06/21 0104 11/07/21 0101  HGB  --    < > 14.7  --  15.5* 16.0*  HCT  --   --  44.3  --  48.0* 49.8*  PLT  --   --  113*  --  112* 133*  APTT 69*  --  76*  --  77*  --   HEPARINUNFRC  --    < > 0.53 0.57 0.55 0.37  CREATININE 1.51*  --  1.40*  --  1.21* 1.30*   < > = values in this interval not displayed.     Estimated Creatinine Clearance: 36.2 mL/min (A) (by C-G formula based on SCr of 1.3 mg/dL (H)).  Assessment: 56 yoF on apixaban PTA for hx PE with concern for ACS. Apixaban to transition to IV heparin for ischemic workup. Possible plan for left and right heart cath. Last apixaban dose was PTA 11/23 ~0800.   Heparin level therapeutic at 0.37, on heparin drip 1100 uts/hr. Hbg stable, plt on lower end but okay at 133. No s/sx of bleeding or infusion issues per nursing.   Goal of Therapy:  Heparin level 0.3-0.7 units/ml aPTT 66-102 seconds Monitor platelets by anticoagulation protocol: Yes   Plan:  Continue heparin at 1100 units/hr HL and CBC daily while on heparin Follow-up transition back to [place], PharmD, [place] Pharmacist  Phone: [phone] 11/07/2021 7:20 AM  Please check AMION for all [place] phone numbers After 10:00 PM, call [place]  ADDENDUM Was on apixaban 5 mg BID PTA for hx PE. No plans for cardiac cath - will discontinue heparin  infusion and transition to apixaban this morning. No s/sx of bleeding.  Antonietta Jewel, PharmD, [place] Clinical Pharmacist

## 2021-11-07 NOTE — TOC Transition Note (Signed)
Transition of Care [facility]) - CM/SW Discharge Note   Patient Details  Name: Elizabeth Melton MRN: [id number] Date of Birth: [birth year]  Transition of Care [facility]) CM/SW Contact:  Zenon Mayo, RN Phone Number: 11/07/2021, 11:33 AM   Clinical Narrative:    Patient is for dc today, she has shower stool in the room with her per staff RN.  She has no other needs.    Final next level of care: Home/Self Care Barriers to Discharge: No Barriers Identified   Patient Goals and CMS Choice        Discharge Placement                       Discharge Plan and Services   Discharge Planning Services: CM Consult Post Acute Care Choice: Home Health          DME Arranged: Shower stool   Date DME Agency Contacted: 11/04/21 Time DME Agency Contacted: [phone] Representative spoke with at DME Agency: [place] ([place]) Interventions     Readmission Risk Interventions Readmission Risk Prevention Plan 09/10/2020  Post Dischage Appt Complete  Medication Screening Complete  Transportation Screening Complete  Some recent data might be hidden

## 2021-11-07 NOTE — Assessment & Plan Note (Signed)
Cr improving.  Net negative 1.6L yesterday, 3L on admission.  -Continue Furosemide 80 mg IV twice a day  -Strict I/Os, daily weights, telemetry  -Daily monitoring renal function -Consult Cardiology, appreciate cares   

## 2021-11-07 NOTE — Discharge Summary (Signed)
Physician Discharge Summary   Patient name: Elizabeth Melton  Admit date:     11/02/2021  Discharge date: 11/07/2021  Discharge Physician: Edwin Dada   PCP: Kathyrn Lass, MD   Recommendations at discharge:  Follow up with PCP Dr. Sabra Heck in 1 week Obtain BMP in 1 week on new torsemide Follow up with Advanced [facility] clinic on 12/9, if goals of care change, may need sooner follow up with Pulmonology, Dr. Orchard Hospital Course   Mrs. Pritz is a 80 y.o. F with pulmonary fibrosis on 3L home O2, dCHF, hx PE on Eliquis, DM, HTN, and hypothyroidism who presented with abdominal distention/pain for a week or so with shortness of breath and constipation.  She was incidentally noted to have elevated troponins and worsening renal insufficiency, and appeared swollen.  Cardiology were consulted.  11/25: Echo showed reduced RVSF and increased est. PA pressures 11/25 - 11/28: Underwent IV diuresis and symptoms improved to baseline       Principal discharge diagnosis: * Acute right-sided CHF (congestive heart failure) ([place]) Admitted and echo showed reduced RVSP.  Started on diuresis and creatinine improved and symptoms resolved to baseline.  Discharged on torsemide 40 mg daily.  Has close Cardiology follow up.   Please check BMP in 1 week on new torsemide.      Other Discharge Diagnoses Elevated troponin Likely demand ischemia from right heart failure.  Doubt ACS, infarction.   AF (paroxysmal atrial fibrillation) ([place]) She was managed on heparin drip.  Repeat RHC was deferred, she was transitioned back to Eliquis at d/c    Chronic diastolic heart failure ([place])  ILD (interstitial lung disease) ([place])  Diabetes mellitus without complication ([place])  Essential (primary) hypertension  Hypothyroidism  Tobacco abuse Smoking cessation recommended, modalities discussed.    Constipation Resolved.     Procedures performed:  Echocardiogram CT abdomen and  pelvis with contrast  Condition at discharge: good  Exam Physical Exam Vitals reviewed.  Constitutional:      General: She is not in acute distress.    Appearance: She is not toxic-appearing.  Cardiovascular:     Rate and Rhythm: Normal rate.     Heart sounds: No murmur heard.   No gallop.  Pulmonary:     Effort: Pulmonary effort is normal.     Breath sounds: Normal breath sounds. No wheezing or rales.     Comments: Nasal cannula in place Abdominal:     General: There is no distension.     Palpations: Abdomen is soft.     Tenderness: There is no abdominal tenderness.  Skin:    General: Skin is warm and dry.  Neurological:     General: No focal deficit present.     Mental Status: She is alert and oriented to person, place, and time.     Motor: No weakness.  Psychiatric:        Mood and Affect: Mood normal.        Behavior: Behavior normal.     Disposition: Home  Discharge time: greater than 30 minutes.  Follow-up Information     Kathyrn Lass, MD. Schedule an appointment as soon as possible for a visit .   Specialty: Family Medicine Contact information: [place] [address] [phone]         Larey Dresser, MD. Go in 1 week(s).   Specialty: Cardiology Why: You have an appointment at 2:40pm on 11/18/21 Contact information: [address]  [place] [address] [phone]                Discharge Instructions     Diet - low sodium heart healthy   Complete by: As directed    Discharge instructions   Complete by: As directed    From Dr. Loleta Books:  You were admitted for right heart failure. It may have seemed that the "fullness" in your belly was from constipation, but our CAT scan here (imaging of your belly) showed no significant constipation  Instead, our labs and the ultrasound of your heart here suggested that the problem was from your heart.  Specifically, that your right heart (that pumps blood TO the  lungs) is weakened by your lung disease.  You were treated with strong IV Lasix and this is better. On going home, we think you should take the alternative diuretic torsemide, instead of oral Lasix.  Stop furosemide/Lasix Start torsemide  Continue your other home medicines  Go to the appointment with Dr. Aundra Dubin in 1 week.  This appointment is for evaluation of right heart failure   Increase activity slowly   Complete by: As directed        Allergies as of 11/07/2021       Reactions   Lisinopril    Other reaction(s): cough 09/20/17   Losartan Other (See Comments)   Hallucinations        Medication List     STOP taking these medications    furosemide 40 MG tablet Commonly known as: LASIX   insulin glargine 100 UNIT/ML Solostar Pen Commonly known as: LANTUS       TAKE these medications    acetaminophen 500 MG tablet Commonly known as: TYLENOL Take 500-1,000 mg by mouth every 8 (eight) hours as needed for headache or mild pain (pain).   apixaban 5 MG Tabs tablet Commonly known as: ELIQUIS Take 1 tablet (5 mg total) by mouth 2 (two) times daily.   b complex vitamins tablet Take 1 tablet by mouth daily.   bisacodyl 10 MG suppository Commonly known as: DULCOLAX Place 1 suppository (10 mg total) rectally daily as needed for severe constipation.   buPROPion 150 MG 24 hr tablet Commonly known as: WELLBUTRIN XL Take 150 mg by mouth daily.   Calcium + D3 600-200 MG-UNIT Tabs Take 1 tablet by mouth daily.   FISH OIL PO Take 1,000 mg by mouth daily.   glucose blood test strip 1 each by Other route as needed for other (blood sugar).   insulin aspart 100 UNIT/ML FlexPen Commonly known as: NOVOLOG Insulin sliding scale: Blood sugar  120-150   3units                       151-200   4units                       201-250   7units                       251- 300  11units                       301-350   15uints                       351-400   20units                        >  400         call MD immediately   levothyroxine 75 MCG tablet Commonly known as: SYNTHROID Take 75 mcg by mouth daily before breakfast.   lovastatin 40 MG tablet Commonly known as: MEVACOR Take 40 mg by mouth at bedtime.   metoprolol succinate 25 MG 24 hr tablet Commonly known as: TOPROL-XL Take 0.5 tablets (12.5 mg total) by mouth daily.   nicotine 14 mg/24hr patch Commonly known as: NICODERM CQ - dosed in mg/24 hours Place 14 mg onto the skin daily.   OXYGEN Inhale 3 L into the lungs continuous.   PEN NEEDLES 29GX1/2" 29G X 12MM Misc For insulin injection   potassium chloride 10 MEQ tablet Commonly known as: KLOR-CON TAKE 1 TABLET EVERY DAY What changed: how to take this   Smart Sense Thin Lancets 26G Misc 1 each by Does not apply route 2 (two) times daily.   torsemide 20 MG tablet Commonly known as: DEMADEX Take 2 tablets (40 mg total) by mouth daily.   True Metrix Meter w/Device Kit 1 each by Other route in the morning and at bedtime.   VITAMIN D PO Take 1,200 Units by mouth daily.               Durable Medical Equipment  (From admission, onward)           Start     Ordered   11/04/21 1042  For home use only DME Shower stool  Once        11/04/21 1042            DG Chest 2 View  Result Date: 11/03/2021 CLINICAL DATA:  Chest pain. EXAM: CHEST - 2 VIEW COMPARISON:  08/17/2021. FINDINGS: The heart is enlarged. Atherosclerotic calcification of the aorta is noted. Extensive interstitial thickening is present bilaterally. Airspace opacities are noted in the peripheral aspects of the lungs, which is slightly increased on the right from the prior exam. There is mild blunting of the right costophrenic angle and a small pleural effusion can not be excluded. No pneumothorax is seen. IMPRESSION: 1. Cardiomegaly. 2. Diffuse interstitial prominence bilaterally, compatible with known pulmonary fibrosis. Increased airspace disease is noted in the  lateral aspects of the right lung, which may represent superimposed edema, infection, or progression of interstitial lung disease. 3. Blunting of the right costophrenic angle, possible small pleural effusion. Electronically Signed   By: Brett Fairy M.D.   On: 11/03/2021 04:33   CT ABDOMEN PELVIS W CONTRAST  Result Date: 11/03/2021 CLINICAL DATA:  Right lower quadrant pain and abdominal distension EXAM: CT ABDOMEN AND PELVIS WITH CONTRAST TECHNIQUE: Multidetector CT imaging of the abdomen and pelvis was performed using the standard protocol following bolus administration of intravenous contrast. CONTRAST:  82m OMNIPAQUE IOHEXOL 300 MG/ML  SOLN COMPARISON:  None. FINDINGS: Lower chest: Mild fibrotic changes are noted in the bases bilaterally. No acute infiltrate is seen. Hepatobiliary: Fatty infiltration of the liver is noted. Gallbladder is well distended with multiple gallstones within. No ductal dilatation is seen. Pancreas: Unremarkable. No pancreatic ductal dilatation or surrounding inflammatory changes. Spleen: Normal in size without focal abnormality. Adrenals/Urinary Tract: The adrenal glands are within normal limits. Kidneys demonstrate a normal enhancement pattern bilaterally. No obstructive changes are seen. A few small cysts are noted within the left kidney. No obstructive changes are seen. The bladder is partially distended. Stomach/Bowel: Mild diverticular change of the colon is noted without evidence of diverticulitis. The appendix is air-filled and within normal limits. Small  bowel and stomach are unremarkable. Vascular/Lymphatic: Aortic atherosclerosis. No enlarged abdominal or pelvic lymph nodes. Reproductive: Multiple calcified uterine fibroids are noted. No adnexal mass is noted. Other: No abdominal wall hernia or abnormality. No abdominopelvic ascites. Musculoskeletal: No acute or significant osseous findings. Degenerative anterolisthesis of L4 on L5 is noted. IMPRESSION: Fatty liver and  findings of cholelithiasis. No complicating factors are noted. Normal-appearing appendix. Diverticulosis without diverticulitis. Multiple uterine fibroids. Electronically Signed   By: Inez Catalina M.D.   On: 11/03/2021 03:11   ECHOCARDIOGRAM COMPLETE  Result Date: 11/04/2021    ECHOCARDIOGRAM REPORT   Patient Name:   JAILYNNE OPPERMAN Date of Exam: 11/04/2021 Medical Rec #:  [id number]          Height:       66.0 in Accession #:    [id number]         Weight:       167.5 lb Date of Birth:  21-Jan-1941          BSA:          1.855 m Patient Age:    35 years           BP:           126/62 mmHg Patient Gender: F                  HR:           76 bpm. Exam Location:  Inpatient Procedure: 2D Echo Indications:    acute diastolic chf  History:        Patient has prior history of Echocardiogram examinations, most                 recent 10/05/2021. Signs/Symptoms:elevated troponin; Risk                 Factors:Current Smoker, Diabetes, Hypertension and Dyslipidemia.  Sonographer:    Johny Chess RDCS Referring Phys: [id number] [place]  1. Left ventricular ejection fraction, by estimation, is 60 to 65%. The left ventricle has normal function. The left ventricle has no regional wall motion abnormalities. Left ventricular diastolic parameters were normal.  2. Right ventricular systolic function is mildly reduced. The right ventricular size is mildly enlarged. There is severely elevated pulmonary artery systolic pressure.  3. Left atrial size was mildly dilated.  4. Right atrial size was moderately dilated.  5. The pericardial effusion is posterior to the left ventricle.  6. The mitral valve is abnormal. Mild mitral valve regurgitation. No evidence of mitral stenosis. Moderate mitral annular calcification.  7. Significant pulmonary HTN based on TR velocity . Tricuspid valve regurgitation is moderate.  8. CW across AV not done Manual trace from AR tracing shows stable mean gradient of 9 mmHg . The  aortic valve is normal in structure. Aortic valve regurgitation is mild. Mild aortic valve stenosis.  9. The inferior vena cava is dilated in size with <50% respiratory variability, suggesting right atrial pressure of 15 mmHg. FINDINGS  Left Ventricle: Left ventricular ejection fraction, by estimation, is 60 to 65%. The left ventricle has normal function. The left ventricle has no regional wall motion abnormalities. The left ventricular internal cavity size was normal in size. There is  no left ventricular hypertrophy. Left ventricular diastolic parameters were normal. Right Ventricle: The right ventricular size is mildly enlarged. Right vetricular wall thickness was not assessed. Right ventricular systolic function is mildly reduced. There is severely elevated pulmonary artery systolic pressure. The tricuspid regurgitant  velocity is 4.62 m/s, and with an assumed right atrial pressure of 10 mmHg, the estimated right ventricular systolic pressure is 51.8 mmHg. Left Atrium: Left atrial size was mildly dilated. Right Atrium: Right atrial size was moderately dilated. Pericardium: Trivial pericardial effusion is present. The pericardial effusion is posterior to the left ventricle. Mitral Valve: The mitral valve is abnormal. There is moderate thickening of the mitral valve leaflet(s). There is moderate calcification of the mitral valve leaflet(s). Moderate mitral annular calcification. Mild mitral valve regurgitation. No evidence of mitral valve stenosis. Tricuspid Valve: Significant pulmonary HTN based on TR velocity. The tricuspid valve is normal in structure. Tricuspid valve regurgitation is moderate . No evidence of tricuspid stenosis. Aortic Valve: CW across AV not done Manual trace from AR tracing shows stable mean gradient of 9 mmHg. The aortic valve is normal in structure. Aortic valve regurgitation is mild. Aortic regurgitation PHT measures 413 msec. Mild aortic stenosis is present. Pulmonic Valve: The pulmonic  valve was normal in structure. Pulmonic valve regurgitation is mild. No evidence of pulmonic stenosis. Aorta: The aortic root is normal in size and structure. Venous: The inferior vena cava is dilated in size with less than 50% respiratory variability, suggesting right atrial pressure of 15 mmHg. IAS/Shunts: No atrial level shunt detected by color flow Doppler.  LEFT VENTRICLE PLAX 2D LVIDd:         4.20 cm LVIDs:         2.90 cm LV PW:         1.00 cm LV IVS:        0.90 cm LVOT diam:     1.70 cm LV SV:         57 LV SV Index:   31 LVOT Area:     2.27 cm  RIGHT VENTRICLE            IVC RV S prime:     8.38 cm/s  IVC diam: 2.50 cm TAPSE (M-mode): 1.5 cm LEFT ATRIUM             Index        RIGHT ATRIUM           Index LA diam:        3.50 cm 1.89 cm/m   RA Area:     20.60 cm LA Vol (A2C):   63.8 ml 34.40 ml/m  RA Volume:   61.80 ml  33.32 ml/m LA Vol (A4C):   57.5 ml 31.00 ml/m LA Biplane Vol: 61.6 ml 33.21 ml/m  AORTIC VALVE LVOT Vmax:   137.00 cm/s LVOT Vmean:  85.900 cm/s LVOT VTI:    0.253 m AI PHT:      413 msec  AORTA Ao Root diam: 3.20 cm Ao Asc diam:  3.10 cm MITRAL VALVE               TRICUSPID VALVE MV Area (PHT): 2.83 cm    TR Peak grad:   85.4 mmHg MV Decel Time: 268 msec    TR Vmax:        462.00 cm/s MR Peak grad: 100.0 mmHg MR Mean grad: 57.0 mmHg    SHUNTS MR Vmax:      500.00 cm/s  Systemic VTI:  0.25 m MR Vmean:     354.0 cm/s   Systemic Diam: 1.70 cm MV E velocity: 79.40 cm/s MV A velocity: 93.00 cm/s MV E/A ratio:  0.85 Jenkins Rouge MD Electronically signed by Jenkins Rouge MD Signature Date/Time: 11/04/2021/10:35:02 AM  Final    Results for orders placed or performed during the hospital encounter of 11/02/21  Resp Panel by RT-PCR (Flu A&B, Covid) Nasopharyngeal Swab     Status: None   Collection Time: 11/03/21  9:11 AM   Specimen: Nasopharyngeal Swab; Nasopharyngeal(NP) swabs in vial transport medium  Result Value Ref Range Status   SARS Coronavirus 2 by RT PCR NEGATIVE NEGATIVE  Final    Comment: (NOTE) SARS-CoV-2 target nucleic acids are NOT DETECTED.  The SARS-CoV-2 RNA is generally detectable in upper respiratory specimens during the acute phase of infection. The lowest concentration of SARS-CoV-2 viral copies this assay can detect is 138 copies/mL. A negative result does not preclude SARS-Cov-2 infection and should not be used as the sole basis for treatment or other patient management decisions. A negative result may occur with  improper specimen collection/handling, submission of specimen other than nasopharyngeal swab, presence of viral mutation(s) within the areas targeted by this assay, and inadequate number of viral copies(<138 copies/mL). A negative result must be combined with clinical observations, patient history, and epidemiological information. The expected result is Negative.  Fact Sheet for Patients:  EntrepreneurPulse.com.au  Fact Sheet for Healthcare Providers:  IncredibleEmployment.be  This test is no t yet approved or cleared by the [place] FDA and  has been authorized for detection and/or diagnosis of SARS-CoV-2 by FDA under an Emergency Use Authorization (EUA). This EUA will remain  in effect (meaning this test can be used) for the duration of the COVID-19 declaration under Section 564(b)(1) of the Act, 21 U.S.C.section 360bbb-3(b)(1), unless the authorization is terminated  or revoked sooner.       Influenza A by PCR NEGATIVE NEGATIVE Final   Influenza B by PCR NEGATIVE NEGATIVE Final    Comment: (NOTE) The Xpert Xpress SARS-CoV-2/FLU/RSV plus assay is intended as an aid in the diagnosis of influenza from Nasopharyngeal swab specimens and should not be used as a sole basis for treatment. Nasal washings and aspirates are unacceptable for Xpert Xpress SARS-CoV-2/FLU/RSV testing.  Fact Sheet for Patients: EntrepreneurPulse.com.au  Fact Sheet for Healthcare  Providers: IncredibleEmployment.be  This test is not yet approved or cleared by the [place] FDA and has been authorized for detection and/or diagnosis of SARS-CoV-2 by FDA under an Emergency Use Authorization (EUA). This EUA will remain in effect (meaning this test can be used) for the duration of the COVID-19 declaration under Section 564(b)(1) of the Act, 21 U.S.C. section 360bbb-3(b)(1), unless the authorization is terminated or revoked.  Performed at [facility], [place] [address] [place], [place] [address]   MRSA Next Gen by PCR, Nasal     Status: None   Collection Time: 11/03/21  1:20 PM   Specimen: Nasal Mucosa; Nasal Swab  Result Value Ref Range Status   MRSA by PCR Next Gen NOT DETECTED NOT DETECTED Final    Comment: (NOTE) The GeneXpert MRSA Assay (FDA approved for NASAL specimens only), is one component of a comprehensive MRSA colonization surveillance program. It is not intended to diagnose MRSA infection nor to guide or monitor treatment for MRSA infections. Test performance is not FDA approved in patients less than 46 years old. Performed at [facility], [place] [address], [place], [place] [address]     Signed:  Edwin Dada MD.  [facility] 11/07/2021, 4:12 PM

## 2021-11-07 NOTE — Assessment & Plan Note (Signed)
Glucose normal. -Continue SS corrections -Continue pravastatin

## 2021-11-07 NOTE — Progress Notes (Addendum)
Patient provided with verbal discharge instructions. Paper copy of discharge provided to patient. TOC medications brought to beside. RN answered all questions. VSS at discharge. IV removed. Patient belongings & shower chair sent with patient. Patient dc'd via wheelchair to private vehicle through [employer] entrance.

## 2021-11-09 ENCOUNTER — Other Ambulatory Visit: Payer: Self-pay

## 2021-11-09 NOTE — Patient Outreach (Signed)
[place] [facility]) Care Management  11/09/2021  NAYAB ATEN [birth year] [id number]   Telephone Assessment    Unsuccessful outreach attempt to patient. No answer after several rings.     Plan: RN CM will make outreach attempt to patient within 3-4 business days.   Enzo Montgomery, RN,BSN,CCM [facility] Telephonic Care Management Coordinator Direct Phone: [phone] Toll Free: [phone] Fax: [phone]

## 2021-11-10 ENCOUNTER — Other Ambulatory Visit: Payer: Self-pay

## 2021-11-10 NOTE — Patient Outreach (Signed)
Elizabeth Melton) Care Management  11/10/2021  Elizabeth Melton Feb 02, 1941 [id number]   Telephone Assessment     Successful outreach call placed to patient. She reports that she is doing fairly well. She is currently still in bed and requested brief call. Patient confirms she has cardiology follow up appt next week. She reports she has had no sxs flare ups. She denies any RN CM needs or concerns at this time.   Medications Reviewed Today     Reviewed by [name], RN (Registered Nurse) on 11/10/21 at [phone]  Med List Status: <None>   Medication Order Taking? Sig Documenting Provider Last Dose Status Informant  acetaminophen (TYLENOL) 500 MG tablet [id number] No Take 500-1,000 mg by mouth every 8 (eight) hours as needed for headache or mild pain (pain). [provider] Past Month Active Self  apixaban (ELIQUIS) 5 MG TABS tablet [id number] No Take 1 tablet (5 mg total) by mouth 2 (two) times daily. [name], NP 11/02/2021 0800 Active Self  b complex vitamins tablet [id number] No Take 1 tablet by mouth daily. [provider] 11/01/2021 Active Self  bisacodyl (DULCOLAX) 10 MG suppository [id number]  Place 1 suppository (10 mg total) rectally daily as needed for severe constipation. Danford, Suann Larry, MD  Active   Blood Glucose Monitoring Suppl (TRUE METRIX METER) w/Device KIT [id number] No 1 each by Other route in the morning and at bedtime. [provider] Taking Active Self  buPROPion (WELLBUTRIN XL) 150 MG 24 hr tablet [id number] No Take 150 mg by mouth daily. [provider] 11/01/2021 Active Self  Calcium Carb-Cholecalciferol (CALCIUM + D3) 600-200 MG-UNIT TABS [id number] No Take 1 tablet by mouth daily. [provider] 11/01/2021 Active Self           Med Note [place] Melton, Elizabeth D   Thu Nov 03, 2021  5:44 AM)    glucose blood test strip [id number] No 1 each by Other route as needed for other (blood sugar). [provider] Taking Active Self  insulin aspart (NOVOLOG) 100 UNIT/ML FlexPen [id number] No Insulin sliding scale: Blood sugar  120-150   3units                       151-200   4units                       201-250   7units                       251- 300  11units                       301-350   15uints                       351-400   20units                       >400         call MD immediately [name], MD 11/01/2021 Active Self  Insulin Pen Needle (PEN NEEDLES 29GX1/2") 29G X 12MM MISC [id number] No For insulin injection [name], MD Taking Active Self  levothyroxine (SYNTHROID, LEVOTHROID) 75 MCG tablet [id number] No Take 75 mcg by mouth daily before breakfast.  [provider] 11/02/2021 Active Self  lovastatin (MEVACOR) 40 MG tablet [id number] No Take 40 mg by mouth at bedtime. [provider] 11/01/2021 Active Self  metoprolol succinate (TOPROL-XL) 25 MG 24 hr tablet [id number] No Take 0.5 tablets (12.5 mg total) by mouth daily. [name], MD 11/01/2021 Active Self  nicotine (NICODERM CQ - DOSED IN MG/24 HOURS) 14 mg/24hr patch [id number] No Place 14 mg onto the skin daily. [provider] 11/02/2021 Active Self           Med Note Richardson Melton, Elizabeth N   Fri Jul 01, 2021 11:29 AM)    Omega-3 Fatty Acids (FISH OIL PO) [id number] No Take 1,000 mg by mouth daily. [provider] 11/01/2021 Active Self  OXYGEN [id number] No Inhale 3 L into the lungs continuous. [provider] Taking Active Self  potassium chloride (KLOR-CON) 10 MEQ tablet [id number] No TAKE 1 TABLET EVERY DAY  Patient taking differently: 10 mEq daily.   Elizabeth Heinz, MD 11/01/2021 Active Self  SMART SENSE THIN LANCETS 26G MISC [id number] No 1 each by Does not apply route 2 (two) times daily. [provider] Taking Active Self  sodium chloride flush (NS) 0.9 % injection 3 mL [id number]   Elizabeth Man, MD  Active   torsemide (DEMADEX) 20 MG tablet [id number]  Take 2  tablets (40 mg total) by mouth daily. Elizabeth Dada, MD  Active   VITAMIN D PO [id number] No Take 1,200 Units by mouth daily. [provider] 11/01/2021 Active Self             Care Plan : Heart Failure (Adult)  Updates made by [name], RN since 11/10/2021 12:00 AM     Problem: Symptom Exacerbation (Heart Failure)      Long-Range Goal: Symptom Exacerbation Prevented or Minimized-No eadmisison in the next 90 days Completed 11/10/2021  Start Date: 09/12/2021  Expected End Date: 11/12/2021  Recent Progress: On track  Priority: High  Note:    Notes:   07/18/21 RN CM discussed importance of adhering to treatment plan. RN CM reinforced to patient importance of daily wgt monitoring. RN CM reviewed wgt monitoring log RN CM instructed on s/s of abnormal wgt gain and when to seek medical attention.  RN CM educated on use of diuretic and importance of adherence.   08/09/2021 RN CM reinforced to patient importance of daily wgt monitoring. RN CM reviewed wgt monitoring log   09/12/21 RN CM discussed importance of adhering to treatment plan. RN CM confirmed pt has action plan in place and knows when to seek medical attention.  RN CM assessed for any acute issues or concerns  [id number]/96/[id number] due to duplicate care plan and goals    Task: Identify and Minimize Risk of Heart Failure Exacerbation Completed 11/10/2021  Note:   Care Management Activities:    - barriers to lifestyle changes reviewed and addressed - healthy lifestyle promoted - medication-adherence assessment completed - rescue (action) plan reviewed    Notes:     Care Plan : Diabetes Type 2 (Adult)  Updates made by [name], RN since 11/10/2021 12:00 AM     Problem: Glycemic Management (Diabetes, Type 2)      Long-Range Goal: Glycemic Management Optimized-Maintain A1C level of 7.0 or les Completed 11/10/2021  Start Date: 07/18/2021  Expected End Date:  12/09/2021  Recent Progress: On track  Priority: High  Note:   Evidence-based guidance:  Anticipate A1C testing (point-of-care) every 3 to 6 months based on goal attainment.  Review mutually-set A1C goal or target range.  Anticipate use of antihyperglycemic with or without insulin and periodic adjustments;  consider active involvement of pharmacist.  Provide medical nutrition therapy and development of individualized eating.  Compare self-reported symptoms of hypo or hyperglycemia to blood glucose levels, diet and fluid intake, current medications, psychosocial and physiologic stressors, change in activity and barriers to care adherence.  Promote self-monitoring of blood glucose levels.  Assess and address barriers to management plan, such as food insecurity, age, developmental ability, depression, anxiety, fear of hypoglycemia or weight gain, as well as medication cost, side effects and complicated regimen.  Consider referral to community-based diabetes education program, visiting nurse, community health worker or health coach.  Encourage regular dental care for treatment of periodontal disease; refer to dental provider when needed.   Notes:   07/18/21 RN CM assessed for blood glucose monitoring and any barriers.  RN CM discussed importance of adhering to treatment plan. RN CM discussed importance of monitoring cbgs as ordered and assessed for any barriers.   09/12/21 RN CM assessed for blood glucose monitoring and any barriers.  RN CM discussed importance of adhering to treatment plan.  68/61/[id number] due to duplicate care plan and goals    Task: Alleviate Barriers to Glycemic Management Completed 11/10/2021  Note:   Care Management Activities:    - blood glucose monitoring encouraged - blood glucose readings reviewed    Notes:     Care Plan : RN Care Manager POC  Updates made by [name], RN since 11/10/2021 12:00 AM     Problem: Chronic Disease Mgmt of  Chronic Condition-HF & DM   Priority: High     Long-Range Goal: Development of POC for Mgmt of Chronic Condition-DM   Start Date: 11/10/2021  Expected End Date: 11/10/2022  Priority: High  Note:   Current Barriers:  None  RNCM Clinical Goal(s):  Patient will verbalize basic understanding of  DMII disease process and self health management plan as evidenced by mgmt of chronic conditions demonstrate Ongoing health management independence as evidenced by therapeutic A1C level continue to work with RN Care Manager to address care management and care coordination needs related to  DMII as evidenced by adherence to CM Team Scheduled appointments through collaboration with RN Care manager, provider, and care team.   Interventions: POC sent to PCP upon initial assessment, quarterly and with any changes in patient's conditions Inter-disciplinary care team collaboration (see longitudinal plan of care) Evaluation of current treatment plan related to  self management and patient's adherence to plan as established by provider   Diabetes Interventions:  (Status:  New goal.) Long Term Goal Assessed patient's understanding of A1c goal: <7% Provided education to patient about basic DM disease process Discussed plans with patient for ongoing care management follow up and provided patient with direct contact information for care management team Lab Results  Component Value Date   HGBA1C 7.6 (H) 11/05/2021   Patient Goals/Self-Care Activities: Take all medications as prescribed Attend all scheduled provider appointments check feet daily for cuts, sores or redness enter blood sugar readings and medication or insulin into daily log take the blood sugar log to all doctor visits  Follow Up Plan:  Telephone follow up appointment with care management team member scheduled for:  Jan The patient has been provided with contact information for the care management team and has been advised to call with any  health related questions or concerns.      Long-Range Goal: Development of POC for Mgmt of Chronic Condition-HF   Start Date: 11/10/2021  Expected End Date: 11/10/2022  Priority: High  Note:   Current Barriers:  None  RNCM Clinical Goal(s):  Patient will verbalize understanding of plan for management of CHF as evidenced by mgmt of chronic conditions take all medications exactly as prescribed and will call provider for medication related questions as evidenced by med adherence durign med review continue to work with RN Care Manager to address care management and care coordination needs related to  CHF as evidenced by adherence to CM Team Scheduled appointments through collaboration with RN Care manager, provider, and care team.   Interventions: POC sent to PCP upon initial assessment, quarterly and with any changes in patient's conditions Inter-disciplinary care team collaboration (see longitudinal plan of care) Evaluation of current treatment plan related to  self management and patient's adherence to plan as established by provider   Heart Failure Interventions:  (Status:  New goal.) Long Term Goal Provided education on low sodium diet Discussed importance of daily weight and advised patient to weigh and record daily Reviewed role of diuretics in prevention of fluid overload and management of heart failure; Discussed the importance of keeping all appointments with provider  Patient Goals/Self-Care Activities: Take all medications as prescribed Attend all scheduled provider appointments Call provider office for new concerns or questions  call office if I gain more than 2 pounds in one day or 5 pounds in one week keep legs up while sitting track weight in diary watch for swelling in feet, ankles and legs every day weigh myself daily  Follow Up Plan:  Telephone follow up appointment with care management team member scheduled for:  within the month of Jan The patient has been  provided with contact information for the care management team and has been advised to call with any health related questions or concerns.       Plan: RN CM discussed with patient next outreach within the month of Jan.Patient agrees to care plan and follow up. RN CM will send quarterly update to PCP.   Enzo Montgomery, RN,BSN,CCM [facility] Telephonic Care Management Coordinator Direct Phone: [phone] Toll Free: [phone] Fax: [phone]

## 2021-11-17 ENCOUNTER — Ambulatory Visit: Payer: Self-pay

## 2021-11-18 ENCOUNTER — Encounter (HOSPITAL_COMMUNITY): Payer: Self-pay | Admitting: Cardiology

## 2021-11-18 ENCOUNTER — Other Ambulatory Visit (HOSPITAL_COMMUNITY): Payer: Self-pay | Admitting: *Deleted

## 2021-11-18 ENCOUNTER — Ambulatory Visit (HOSPITAL_COMMUNITY)
Admission: RE | Admit: 2021-11-18 | Discharge: 2021-11-18 | Disposition: A | Discharge status: Home / Self Care | Payer: Medicare HMO | Source: Ambulatory Visit | Attending: Cardiology | Admitting: Cardiology

## 2021-11-18 ENCOUNTER — Other Ambulatory Visit: Payer: Self-pay

## 2021-11-18 VITALS — BP 132/78 | HR 79 | Wt 164.4 lb

## 2021-11-18 DIAGNOSIS — I480 Paroxysmal atrial fibrillation: Secondary | ICD-10-CM | POA: Diagnosis not present

## 2021-11-18 DIAGNOSIS — Z9981 Dependence on supplemental oxygen: Secondary | ICD-10-CM | POA: Insufficient documentation

## 2021-11-18 DIAGNOSIS — I272 Pulmonary hypertension, unspecified: Secondary | ICD-10-CM | POA: Diagnosis not present

## 2021-11-18 DIAGNOSIS — J84112 Idiopathic pulmonary fibrosis: Secondary | ICD-10-CM | POA: Diagnosis not present

## 2021-11-18 DIAGNOSIS — J439 Emphysema, unspecified: Secondary | ICD-10-CM | POA: Insufficient documentation

## 2021-11-18 DIAGNOSIS — I50810 Right heart failure, unspecified: Secondary | ICD-10-CM

## 2021-11-18 DIAGNOSIS — Z79899 Other long term (current) drug therapy: Secondary | ICD-10-CM | POA: Diagnosis not present

## 2021-11-18 DIAGNOSIS — Z7901 Long term (current) use of anticoagulants: Secondary | ICD-10-CM | POA: Insufficient documentation

## 2021-11-18 DIAGNOSIS — I5032 Chronic diastolic (congestive) heart failure: Secondary | ICD-10-CM | POA: Diagnosis not present

## 2021-11-18 DIAGNOSIS — J9611 Chronic respiratory failure with hypoxia: Secondary | ICD-10-CM | POA: Diagnosis not present

## 2021-11-18 DIAGNOSIS — Z86711 Personal history of pulmonary embolism: Secondary | ICD-10-CM | POA: Diagnosis not present

## 2021-11-18 DIAGNOSIS — Z87891 Personal history of nicotine dependence: Secondary | ICD-10-CM | POA: Diagnosis not present

## 2021-11-18 DIAGNOSIS — R0789 Other chest pain: Secondary | ICD-10-CM | POA: Diagnosis not present

## 2021-11-18 DIAGNOSIS — Z8616 Personal history of COVID-19: Secondary | ICD-10-CM | POA: Diagnosis not present

## 2021-11-18 DIAGNOSIS — I5042 Chronic combined systolic (congestive) and diastolic (congestive) heart failure: Secondary | ICD-10-CM

## 2021-11-18 LAB — BASIC METABOLIC PANEL
Anion gap: 10 (ref 5–15)
BUN: 31 mg/dL — ABNORMAL HIGH (ref 8–23)
CO2: 24 mmol/L (ref 22–32)
Calcium: 8.9 mg/dL (ref 8.9–10.3)
Chloride: 100 mmol/L (ref 98–111)
Creatinine, Ser: 1.17 mg/dL — ABNORMAL HIGH (ref 0.44–1.00)
GFR, Estimated: 47 mL/min — ABNORMAL LOW (ref >60)
Glucose, Bld: 132 mg/dL — ABNORMAL HIGH (ref 70–99)
Potassium: 4.2 mmol/L (ref 3.5–5.1)
Sodium: 134 mmol/L — ABNORMAL LOW (ref 135–145)

## 2021-11-18 LAB — BRAIN NATRIURETIC PEPTIDE: B Natriuretic Peptide: 241.9 pg/mL — ABNORMAL HIGH (ref 0.0–100.0)

## 2021-11-18 LAB — CBC
HCT: 51.9 % — ABNORMAL HIGH (ref 36.0–46.0)
Hemoglobin: 17.1 g/dL — ABNORMAL HIGH (ref 12.0–15.0)
MCH: 33.5 pg (ref 26.0–34.0)
MCHC: 32.9 g/dL (ref 30.0–36.0)
MCV: 101.6 fL — ABNORMAL HIGH (ref 80.0–100.0)
Platelets: 137 10*3/uL — ABNORMAL LOW (ref 150–400)
RBC: 5.11 MIL/uL (ref 3.87–5.11)
RDW: 14.8 % (ref 11.5–15.5)
WBC: 5.3 10*3/uL (ref 4.0–10.5)
nRBC: 0.4 % — ABNORMAL HIGH (ref 0.0–0.2)

## 2021-11-18 MED ORDER — POTASSIUM CHLORIDE ER 10 MEQ PO TBCR: 20.0000 meq | EXTENDED_RELEASE_TABLET | Freq: Every day | ORAL | 3 refills | Status: DC

## 2021-11-18 MED ORDER — TORSEMIDE 20 MG PO TABS: ORAL_TABLET | ORAL | 3 refills | Status: DC

## 2021-11-18 NOTE — Patient Instructions (Addendum)
Increase Torsemide to 40 mg (2 tabs) in AM and 20 mg (1 tab) in PM  Increase Potassium to 20 meq (2 tabs) Daily  Labs done today, we will call you for abnormal results  Your physician has requested that you have a cardiac catheterization. Cardiac catheterization is used to diagnose and/or treat various heart conditions. Doctors may recommend this procedure for a number of different reasons. The most common reason is to evaluate chest pain. Chest pain can be a symptom of coronary artery disease (CAD), and cardiac catheterization can show whether plaque is narrowing or blocking your heart's arteries. This procedure is also used to evaluate the valves, as well as measure the blood flow and oxygen levels in different parts of your heart. For further information please visit [email or web address]. Please follow instruction sheet, as given. INSTRUCTIONS BELOW  Your physician recommends that you schedule a follow-up appointment in: 1 month  If you have any questions or concerns before your next appointment please send [place] a message through [place] or call our office at [phone].    TO LEAVE A MESSAGE FOR THE NURSE SELECT OPTION 2, PLEASE LEAVE A MESSAGE INCLUDING: YOUR NAME DATE OF BIRTH CALL BACK NUMBER REASON FOR CALL**this is important as we prioritize the call backs  YOU WILL RECEIVE A CALL BACK THE SAME DAY AS LONG AS YOU CALL BEFORE 4:00 PM  At the [facility], you and your health needs are our priority. As part of our continuing mission to provide you with exceptional heart care, we have created designated Provider Care Teams. These Care Teams include your primary Cardiologist (physician) and Advanced Practice Providers (APPs- Physician Assistants and Nurse Practitioners) who all work together to provide you with the care you need, when you need it.   You may see any of the following providers on your designated Care Team at your next follow up: Dr Glori Bickers Dr  Haynes Kerns, NP Lyda Jester, [place] [facility] [place], [place] Audry Riles, PharmD   Please be sure to bring in all your medications bottles to every appointment.     CATHETERIZATION INSTRUCTIONS  You are scheduled for a Cardiac Catheterization on Wednesday, December 14 with Dr. Loralie Champagne.  1. Please arrive at the [facility] (Main Entrance A) at [facility]: [address] [place], [place] [address] at 12:30 PM (This time is two hours before your procedure to ensure your preparation). Free valet parking service is available.   Special note: Every effort is made to have your procedure done on time. Please understand that emergencies sometimes delay scheduled procedures.  2. Diet: Do not eat solid foods after midnight.  The patient may have clear liquids until 5am upon the day of the procedure.  3. Labs: DONE TODAY  4. Medication instructions in preparation for your procedure:   DO NOT TAKE ELIQUIS Tuesday 12/13 OR Wednesday 12/14 AM  Wednesday 12/14 AM DO NOT TAKE INSULIN OR TORSEMIDE   On the morning of your procedure, take any morning medicines NOT listed above.  You may use sips of water.  5. Plan for one night stay--bring personal belongings. 6. Bring a current list of your medications and current insurance cards. 7. You MUST have a responsible person to drive you home. 8. Someone MUST be with you the first 24 hours after you arrive home or your discharge will be delayed. 9. Please wear clothes that are easy to get on and off and wear slip-on shoes.  Thank you for allowing [place] to care for you!   -- [facility]  cons

## 2021-11-20 NOTE — H&P (View-Only) (Signed)
PCP: Miller, Lisa, MD Cardiology: Dr. Schumann HF Cardiology: Dr. Alysah Carton  80 y.o. with history of PE, paroxysmal atrial fibrillation, ILD, COPD, and diastolic CHF with RV failure was referred by Dr. Schumann for evaluation of pulmonary hypertension and CHF. Patient had a submassive PE in 9/21 with saddle embolus.  V/Q scan in 9/22 showed no acute or chronic PE.  In 2/22, she was admitted with COVID-19 PNA.  This was complicated by atrial fibrillation with RVR.  Amiodarone was started but later stopped with rise in LFTs.   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  RHC in 7/22 showed elevated PCWP and moderate pulmonary HTN, primarily pulmonary venous hypertension.  She stopped smoking earlier this year.  Echo in 11/22 showed EF 60-65%, mildly decreased RV systolic function with mild RV enlargement, mild MR, moderate TR, PASP 85 mmHg, mild aortic stenosis and mild aortic insufficiency, IVC dilated.   Patient has been on home oxygen now for > 1 year.  She has significant dyspnea.  Short of breath walking around the house, worse recently.  She has occasional lightheaded spells but no loss of consciousness or falls. No palpitations.  She has episodes of central chest pain (tightness), no clear trigger.  These episodes will last 5-10 minutes then subside.  During 2/22 admission with COVID-19, HS-TnI increased to 1000s range.  HS-TnI in 11/22 peaked at 82.   Labs (11/22): BNP 234, K 4.3, creatinine 1.3  ECG (personally reviewed): NSR, RAE, nonspecific T wave changes.   PMH: 1. Type 2 diabetes. 2. Submassive PE: Saddle embolus in 9/21.  3. Hyperlipidemia 4. Hypothyroidism 5. Atrial fibrillation: Paroxysmal.  Elevated LFTs with amiodarone.  6. COVID-19 PNA 2/22 7. ILD: UIP.  Followed by Dr. Ramaswamy.  8. COPD/emphysema: Prior smoker, quit 2022.  - Home oxygen.  9. Pulmonary hypertension/RV failure: Echo (11/22) with EF 60-65%, mildly decreased RV systolic function with  mild RV enlargement, mild MR, moderate TR, PASP 85 mmHg, mild aortic stenosis and mild aortic insufficiency, IVC dilated.  - V/Q scan (9/22): Low probability for acute or chronic PE.  - CT chest (8/22): UIP and moderate emphysema.  - RHC (7/22): mean RA 8, PA 62/20 mean 37, mean PCWP 28, CI 2.15 thermo/1.65 Fick.   Social History   Socioeconomic History   Marital status: Widowed    Spouse name: Not on file   Number of children: Not on file   Years of education: Not on file   Highest education level: Not on file  Occupational History   Occupation: retired  Tobacco Use   Smoking status: Every Day    Packs/day: 1.00    Years: 60.00    Pack years: 60.00    Types: Cigarettes    Start date: 12/11/1958   Smokeless tobacco: Former    Quit date: 09/24/1962   Tobacco comments:    Smokes 3-4 cigs/week currently, wearing patch  Vaping Use   Vaping Use: Never used  Substance and Sexual Activity   Alcohol use: Yes    Comment: drinks beer 12 every 2 weeks   Drug use: No   Sexual activity: Not on file  Other Topics Concern   Not on file  Social History Narrative   Not on file   Social Determinants of Health   Financial Resource Strain: Not on file  Food Insecurity: No Food Insecurity   Worried About Running Out of Food in the Last Year: Never true   Ran Out of Food in   the Last Year: Never true  Transportation Needs: No Transportation Needs   Lack of Transportation (Medical): No   Lack of Transportation (Non-Medical): No  Physical Activity: Not on file  Stress: Not on file  Social Connections: Not on file  Intimate Partner Violence: Not on file   Family History  Problem Relation Age of Onset   Diabetes Mother    Kidney disease Mother    Hypertension Mother    Other Father        tuberculosis   Hypertension Sister    Hyperlipidemia Sister    ROS: All systems reviewed and negative except as per HPI.   Current Outpatient Medications  Medication Sig Dispense Refill    acetaminophen (TYLENOL) 500 MG tablet Take 500-1,000 mg by mouth every 8 (eight) hours as needed for headache or mild pain (pain).     apixaban (ELIQUIS) 5 MG TABS tablet Take 1 tablet (5 mg total) by mouth 2 (two) times daily. 56 tablet 0   b complex vitamins tablet Take 1 tablet by mouth daily.     bisacodyl (DULCOLAX) 10 MG suppository Place 1 suppository (10 mg total) rectally daily as needed for severe constipation. 12 suppository 0   Blood Glucose Monitoring Suppl (TRUE METRIX METER) w/Device KIT 1 each by Other route in the morning and at bedtime.     buPROPion (WELLBUTRIN XL) 150 MG 24 hr tablet Take 150 mg by mouth daily.     Calcium Carb-Cholecalciferol (CALCIUM + D3) 600-200 MG-UNIT TABS Take 1 tablet by mouth daily.     glucose blood test strip 1 each by Other route as needed for other (blood sugar).     insulin aspart (NOVOLOG) 100 UNIT/ML FlexPen Insulin sliding scale: Blood sugar  120-150   3units                       151-200   4units                       201-250   7units                       251- 300  11units                       301-350   15uints                       351-400   20units                       >400         call MD immediately 15 mL 0   Insulin Pen Needle (PEN NEEDLES 29GX1/2") 29G X 12MM MISC For insulin injection 50 each 0   levothyroxine (SYNTHROID, LEVOTHROID) 75 MCG tablet Take 75 mcg by mouth daily before breakfast.      lovastatin (MEVACOR) 40 MG tablet Take 40 mg by mouth at bedtime.     metoprolol succinate (TOPROL-XL) 25 MG 24 hr tablet Take 0.5 tablets (12.5 mg total) by mouth daily. 30 tablet 3   nicotine (NICODERM CQ - DOSED IN MG/24 HOURS) 14 mg/24hr patch Place 14 mg onto the skin daily.     Omega-3 Fatty Acids (FISH OIL PO) Take 1,000 mg by mouth daily.     OXYGEN Inhale 3 L into the lungs continuous.     SMART SENSE THIN   LANCETS 26G MISC 1 each by Does not apply route 2 (two) times daily.     VITAMIN D PO Take 1,200 Units by mouth daily.      potassium chloride (KLOR-CON) 10 MEQ tablet Take 2 tablets (20 mEq total) by mouth daily. 60 tablet 3   torsemide (DEMADEX) 20 MG tablet Take 2 tablets (40 mg total) by mouth in the morning AND 1 tablet (20 mg total) every evening. 90 tablet 3   Current Facility-Administered Medications  Medication Dose Route Frequency Provider Last Rate Last Admin   sodium chloride flush (NS) 0.9 % injection 3 mL  3 mL Intravenous Q12H [name], MD       General: NAD Neck: JVP 8-9 cm with HJR, no thyromegaly or thyroid nodule.  Lungs: Dry crackles at bases.  CV: Nondisplaced PMI.  Heart regular S1/S2, no S3/S4, 1/6 SEM RUSB.  Trace ankle edema.  No carotid bruit.  Normal pedal pulses.  Abdomen: Soft, nontender, no hepatosplenomegaly, mild distention.  Skin: Intact without lesions or rashes.  Neurologic: Alert and oriented x 3.  Psych: Normal affect. Extremities: No clubbing or cyanosis.  HEENT: Normal.   Assessment/Plan: 1. Atrial fibrillation: Paroxysmal.  She is in NSR today, no palpitations.  She had elevated LFTs with amiodarone in the past.  - Continue apixaban.  - Consider ablation with recurrence.  2. Pulmonary embolus: Submassive, 9/21.  No evidence for chronic PE on 9/22 V/Q scan.  3. COPD/emphysema: She quit smoking in 2022.  She is on home oxygen.  4. Interstitial lung disease: UIP, followed by Dr. [name].  If she has pulmonary arterial hypertension, would consider treatment with Tyvaso.  5. 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 The RV failure may be primarily due to pulmonary hypertension. She is volume overloaded on exam, NYHA class III symptoms.  - Increase torsemide to 40 qam/20 qpm.  Add KCl 20 daily.  BMET/BNP today and in 10 days.  6. Pulmonary hypertension:  Pulmonary venous hypertension on 7/22 RHC.  However, last echo in 11/22 showed PASP up to 85 mmHg. No evidence for chronic PE on 9/22 V/Q scan.  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  Cannot rule out group 1 and group 2 components (especially as she is volume overloaded on exam).  - I will arrange for RHC to assess filling pressures and PA pressure. We discussed risks/benefits and she agrees to procedure.  7. Chest pain: Atypical but frequent.  She had significantly elevated troponin in 2/22 hospitalization.  - I will arrange for coronary angiography at the time of RHC.  We discussed risks/benefits and she agrees to procedure.   Loralie Champagne 11/21/2021

## 2021-11-20 NOTE — Progress Notes (Signed)
PCP: Kathyrn Lass, MD Cardiology: Dr. Gardiner Rhyme HF Cardiology: Dr. Aundra Dubin  80 y.o. with history of PE, paroxysmal atrial fibrillation, ILD, COPD, and diastolic CHF with RV failure was referred by Dr. Gardiner Rhyme for evaluation of pulmonary hypertension and CHF. Patient had a submassive PE in 9/21 with saddle embolus.  V/Q scan in 9/22 showed no acute or chronic PE.  In 2/22, she was admitted with COVID-19 PNA.  This was complicated by atrial fibrillation with RVR.  Amiodarone was started but later stopped with rise in LFTs.   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  [place] in 7/22 showed elevated PCWP and moderate pulmonary HTN, primarily pulmonary venous hypertension.  She stopped smoking earlier this year.  Echo in 11/22 showed EF 60-65%, mildly decreased RV systolic function with mild RV enlargement, mild MR, moderate TR, PASP 85 mmHg, mild aortic stenosis and mild aortic insufficiency, IVC dilated.   Patient has been on home oxygen now for > 1 year.  She has significant dyspnea.  Short of breath walking around the house, worse recently.  She has occasional lightheaded spells but no loss of consciousness or falls. No palpitations.  She has episodes of central chest pain (tightness), no clear trigger.  These episodes will last 5-10 minutes then subside.  During 2/22 admission with COVID-19, HS-TnI increased to 1000s range.  HS-TnI in 11/22 peaked at 82.   Labs (11/22): BNP 234, K 4.3, creatinine 1.3  ECG (personally reviewed): NSR, RAE, nonspecific T wave changes.   PMH: 1. Type 2 diabetes. 2. Submassive PE: Saddle embolus in 9/21.  3. Hyperlipidemia 4. Hypothyroidism 5. Atrial fibrillation: Paroxysmal.  Elevated LFTs with amiodarone.  6. COVID-19 PNA 2/22 7. ILD: UIP.  Followed by Dr. [name].  8. COPD/emphysema: Prior smoker, quit 2022.  - Home oxygen.  9. Pulmonary hypertension/RV failure: Echo (11/22) with EF 60-65%, mildly decreased RV systolic function with  mild RV enlargement, mild MR, moderate TR, PASP 85 mmHg, mild aortic stenosis and mild aortic insufficiency, IVC dilated.  - V/Q scan (9/22): Low probability for acute or chronic PE.  - CT chest (8/22): UIP and moderate emphysema.  - RHC (7/22): mean RA 8, PA 62/20 mean 37, mean PCWP 28, CI 2.15 thermo/1.65 Fick.   Social History   Socioeconomic History   Marital status: Widowed    Spouse name: Not on file   Number of children: Not on file   Years of education: Not on file   Highest education level: Not on file  Occupational History   Occupation: retired  Tobacco Use   Smoking status: Every Day    Packs/day: 1.00    Years: 60.00    Pack years: 60.00    Types: Cigarettes    Start date: 12/11/1958   Smokeless tobacco: Former    Quit date: 09/24/1962   Tobacco comments:    Smokes 3-4 cigs/week currently, wearing patch  Vaping Use   Vaping Use: Never used  Substance and Sexual Activity   Alcohol use: Yes    Comment: drinks beer 12 every 2 weeks   Drug use: No   Sexual activity: Not on file  Other Topics Concern   Not on file  Social History Narrative   Not on file   Social Determinants of Health   Financial Resource Strain: Not on file  Food Insecurity: No Food Insecurity   Worried About [place] in the Last Year: Never true   [place] in  the Last Year: Never true  Transportation Needs: No Transportation Needs   Lack of Transportation (Medical): No   Lack of Transportation (Non-Medical): No  Physical Activity: Not on file  Stress: Not on file  Social Connections: Not on file  Intimate Partner Violence: Not on file   Family History  Problem Relation Age of Onset   Diabetes Mother    Kidney disease Mother    Hypertension Mother    Other Father        tuberculosis   Hypertension Sister    Hyperlipidemia Sister    ROS: All systems reviewed and negative except as per HPI.   Current Outpatient Medications  Medication Sig Dispense Refill    acetaminophen (TYLENOL) 500 MG tablet Take 500-1,000 mg by mouth every 8 (eight) hours as needed for headache or mild pain (pain).     apixaban (ELIQUIS) 5 MG TABS tablet Take 1 tablet (5 mg total) by mouth 2 (two) times daily. 56 tablet 0   b complex vitamins tablet Take 1 tablet by mouth daily.     bisacodyl (DULCOLAX) 10 MG suppository Place 1 suppository (10 mg total) rectally daily as needed for severe constipation. 12 suppository 0   Blood Glucose Monitoring Suppl (TRUE METRIX METER) w/Device KIT 1 each by Other route in the morning and at bedtime.     buPROPion (WELLBUTRIN XL) 150 MG 24 hr tablet Take 150 mg by mouth daily.     Calcium Carb-Cholecalciferol (CALCIUM + D3) 600-200 MG-UNIT TABS Take 1 tablet by mouth daily.     glucose blood test strip 1 each by Other route as needed for other (blood sugar).     insulin aspart (NOVOLOG) 100 UNIT/ML FlexPen Insulin sliding scale: Blood sugar  120-150   3units                       151-200   4units                       201-250   7units                       251- 300  11units                       301-350   15uints                       351-400   20units                       >400         call MD immediately 15 mL 0   Insulin Pen Needle (PEN NEEDLES 29GX1/2") 29G X 12MM MISC For insulin injection 50 each 0   levothyroxine (SYNTHROID, LEVOTHROID) 75 MCG tablet Take 75 mcg by mouth daily before breakfast.      lovastatin (MEVACOR) 40 MG tablet Take 40 mg by mouth at bedtime.     metoprolol succinate (TOPROL-XL) 25 MG 24 hr tablet Take 0.5 tablets (12.5 mg total) by mouth daily. 30 tablet 3   nicotine (NICODERM CQ - DOSED IN MG/24 HOURS) 14 mg/24hr patch Place 14 mg onto the skin daily.     Omega-3 Fatty Acids (FISH OIL PO) Take 1,000 mg by mouth daily.     OXYGEN Inhale 3 L into the lungs continuous.     SMART SENSE THIN  LANCETS 26G MISC 1 each by Does not apply route 2 (two) times daily.     VITAMIN D PO Take 1,200 Units by mouth daily.      potassium chloride (KLOR-CON) 10 MEQ tablet Take 2 tablets (20 mEq total) by mouth daily. 60 tablet 3   torsemide (DEMADEX) 20 MG tablet Take 2 tablets (40 mg total) by mouth in the morning AND 1 tablet (20 mg total) every evening. 90 tablet 3   Current Facility-Administered Medications  Medication Dose Route Frequency Provider Last Rate Last Admin   sodium chloride flush (NS) 0.9 % injection 3 mL  3 mL Intravenous Q12H [name], MD       General: NAD Neck: JVP 8-9 cm with HJR, no thyromegaly or thyroid nodule.  Lungs: Dry crackles at bases.  CV: Nondisplaced PMI.  Heart regular S1/S2, no S3/S4, 1/6 SEM RUSB.  Trace ankle edema.  No carotid bruit.  Normal pedal pulses.  Abdomen: Soft, nontender, no hepatosplenomegaly, mild distention.  Skin: Intact without lesions or rashes.  Neurologic: Alert and oriented x 3.  Psych: Normal affect. Extremities: No clubbing or cyanosis.  HEENT: Normal.   Assessment/Plan: 1. Atrial fibrillation: Paroxysmal.  She is in NSR today, no palpitations.  She had elevated LFTs with amiodarone in the past.  - Continue apixaban.  - Consider ablation with recurrence.  2. Pulmonary embolus: Submassive, 9/21.  No evidence for chronic PE on 9/22 V/Q scan.  3. COPD/emphysema: She quit smoking in 2022.  She is on home oxygen.  4. Interstitial lung disease: UIP, followed by Dr. [name].  If she has pulmonary arterial hypertension, would consider treatment with Tyvaso.  5. 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 The RV failure may be primarily due to pulmonary hypertension. She is volume overloaded on exam, NYHA class III symptoms.  - Increase torsemide to 40 qam/20 qpm.  Add KCl 20 daily.  BMET/BNP today and in 10 days.  6. Pulmonary hypertension:  Pulmonary venous hypertension on 7/22 RHC.  However, last echo in 11/22 showed PASP up to 85 mmHg. No evidence for chronic PE on 9/22 V/Q scan.  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  Cannot rule out group 1 and group 2 components (especially as she is volume overloaded on exam).  - I will arrange for RHC to assess filling pressures and PA pressure. We discussed risks/benefits and she agrees to procedure.  7. Chest pain: Atypical but frequent.  She had significantly elevated troponin in 2/22 hospitalization.  - I will arrange for coronary angiography at the time of RHC.  We discussed risks/benefits and she agrees to procedure.   Loralie Champagne 11/21/2021

## 2021-11-22 NOTE — Telephone Encounter (Signed)
Sounds good. Closing msg. Reply not needed

## 2021-11-23 ENCOUNTER — Ambulatory Visit (HOSPITAL_COMMUNITY)
Admission: RE | Admit: 2021-11-23 | Discharge: 2021-11-23 | Disposition: A | Discharge status: Home / Self Care | Payer: Medicare HMO | Source: Ambulatory Visit | Attending: Cardiology | Admitting: Cardiology

## 2021-11-23 ENCOUNTER — Other Ambulatory Visit: Payer: Self-pay

## 2021-11-23 ENCOUNTER — Encounter (HOSPITAL_COMMUNITY)
Admission: RE | Disposition: A | Discharge status: Home / Self Care | Payer: Self-pay | Source: Ambulatory Visit | Attending: Cardiology

## 2021-11-23 DIAGNOSIS — Z9981 Dependence on supplemental oxygen: Secondary | ICD-10-CM | POA: Diagnosis not present

## 2021-11-23 DIAGNOSIS — I5032 Chronic diastolic (congestive) heart failure: Secondary | ICD-10-CM

## 2021-11-23 DIAGNOSIS — I2721 Secondary pulmonary arterial hypertension: Secondary | ICD-10-CM | POA: Diagnosis not present

## 2021-11-23 DIAGNOSIS — I2510 Atherosclerotic heart disease of native coronary artery without angina pectoris: Secondary | ICD-10-CM | POA: Diagnosis not present

## 2021-11-23 DIAGNOSIS — J84112 Idiopathic pulmonary fibrosis: Secondary | ICD-10-CM | POA: Insufficient documentation

## 2021-11-23 DIAGNOSIS — E119 Type 2 diabetes mellitus without complications: Secondary | ICD-10-CM | POA: Insufficient documentation

## 2021-11-23 DIAGNOSIS — Z86711 Personal history of pulmonary embolism: Secondary | ICD-10-CM | POA: Insufficient documentation

## 2021-11-23 DIAGNOSIS — Z8616 Personal history of COVID-19: Secondary | ICD-10-CM | POA: Insufficient documentation

## 2021-11-23 DIAGNOSIS — J439 Emphysema, unspecified: Secondary | ICD-10-CM | POA: Diagnosis not present

## 2021-11-23 DIAGNOSIS — I5042 Chronic combined systolic (congestive) and diastolic (congestive) heart failure: Secondary | ICD-10-CM

## 2021-11-23 DIAGNOSIS — I480 Paroxysmal atrial fibrillation: Secondary | ICD-10-CM | POA: Diagnosis not present

## 2021-11-23 DIAGNOSIS — I272 Pulmonary hypertension, unspecified: Secondary | ICD-10-CM | POA: Diagnosis not present

## 2021-11-23 DIAGNOSIS — I509 Heart failure, unspecified: Secondary | ICD-10-CM | POA: Diagnosis not present

## 2021-11-23 DIAGNOSIS — R0789 Other chest pain: Secondary | ICD-10-CM | POA: Diagnosis present

## 2021-11-23 DIAGNOSIS — F17210 Nicotine dependence, cigarettes, uncomplicated: Secondary | ICD-10-CM | POA: Insufficient documentation

## 2021-11-23 HISTORY — PX: RIGHT/LEFT HEART CATH AND CORONARY ANGIOGRAPHY: CATH118266

## 2021-11-23 LAB — POCT I-STAT EG7
Acid-Base Excess: 1 mmol/L (ref 0.0–2.0)
Bicarbonate: 26 mmol/L (ref 20.0–28.0)
Calcium, Ion: 1.18 mmol/L (ref 1.15–1.40)
HCT: 50 % — ABNORMAL HIGH (ref 36.0–46.0)
Hemoglobin: 17 g/dL — ABNORMAL HIGH (ref 12.0–15.0)
O2 Saturation: 39 %
Potassium: 4.1 mmol/L (ref 3.5–5.1)
Sodium: 142 mmol/L (ref 135–145)
TCO2: 27 mmol/L (ref 22–32)
pCO2, Ven: 41.1 mmHg — ABNORMAL LOW (ref 44.0–60.0)
pH, Ven: 7.409 (ref 7.250–7.430)
pO2, Ven: 22 mmHg — CL (ref 32.0–45.0)

## 2021-11-23 LAB — GLUCOSE, CAPILLARY: Glucose-Capillary: 228 mg/dL — ABNORMAL HIGH (ref 70–99)

## 2021-11-23 LAB — BASIC METABOLIC PANEL WITH GFR
Anion gap: 6 (ref 5–15)
BUN: 33 mg/dL — ABNORMAL HIGH (ref 8–23)
CO2: 23 mmol/L (ref 22–32)
Calcium: 8.4 mg/dL — ABNORMAL LOW (ref 8.9–10.3)
Chloride: 106 mmol/L (ref 98–111)
Creatinine, Ser: 1.03 mg/dL — ABNORMAL HIGH (ref 0.44–1.00)
GFR, Estimated: 55 mL/min — ABNORMAL LOW (ref >60)
Glucose, Bld: 150 mg/dL — ABNORMAL HIGH (ref 70–99)
Potassium: 4 mmol/L (ref 3.5–5.1)
Sodium: 135 mmol/L (ref 135–145)

## 2021-11-23 SURGERY — RIGHT/LEFT HEART CATH AND CORONARY ANGIOGRAPHY
Anesthesia: LOCAL

## 2021-11-23 MED ORDER — SODIUM CHLORIDE 0.9% FLUSH: 3.0000 mL | Freq: Two times a day (BID) | INTRAVENOUS | Status: DC

## 2021-11-23 MED ORDER — SODIUM CHLORIDE 0.9 % IV SOLN: INTRAVENOUS | Status: DC

## 2021-11-23 MED ORDER — LIDOCAINE HCL (PF) 1 % IJ SOLN
INTRAMUSCULAR | Status: DC | PRN
  Administered 2021-11-23: 4 mL

## 2021-11-23 MED ORDER — ONDANSETRON HCL 4 MG/2ML IJ SOLN: 4.0000 mg | Freq: Four times a day (QID) | INTRAMUSCULAR | Status: DC | PRN

## 2021-11-23 MED ORDER — HEPARIN (PORCINE) IN NACL 2000-0.9 UNIT/L-% IV SOLN
INTRAVENOUS | Status: DC | PRN
  Administered 2021-11-23: 1000 mL

## 2021-11-23 MED ORDER — SODIUM CHLORIDE 0.9 % IV SOLN: 250.0000 mL | INTRAVENOUS | Status: DC | PRN

## 2021-11-23 MED ORDER — FENTANYL CITRATE (PF) 100 MCG/2ML IJ SOLN
INTRAMUSCULAR | Status: DC | PRN
  Administered 2021-11-23: 25 ug via INTRAVENOUS

## 2021-11-23 MED ORDER — APIXABAN 5 MG PO TABS: 5.0000 mg | ORAL_TABLET | Freq: Two times a day (BID) | ORAL | 0 refills | Status: AC

## 2021-11-23 MED ORDER — FENTANYL CITRATE (PF) 100 MCG/2ML IJ SOLN
INTRAMUSCULAR | Status: AC
  Filled 2021-11-23: qty 2

## 2021-11-23 MED ORDER — HEPARIN SODIUM (PORCINE) 1000 UNIT/ML IJ SOLN
INTRAMUSCULAR | Status: AC
  Filled 2021-11-23: qty 10

## 2021-11-23 MED ORDER — IOHEXOL 350 MG/ML SOLN
INTRAVENOUS | Status: DC | PRN
  Administered 2021-11-23: 18:00:00 50 mL

## 2021-11-23 MED ORDER — ASPIRIN 81 MG PO CHEW: 81.0000 mg | CHEWABLE_TABLET | ORAL | Status: AC

## 2021-11-23 MED ORDER — LABETALOL HCL 5 MG/ML IV SOLN: 10.0000 mg | INTRAVENOUS | Status: DC | PRN

## 2021-11-23 MED ORDER — ACETAMINOPHEN 325 MG PO TABS: 650.0000 mg | ORAL_TABLET | ORAL | Status: DC | PRN

## 2021-11-23 MED ORDER — LIDOCAINE HCL (PF) 1 % IJ SOLN
INTRAMUSCULAR | Status: AC
  Filled 2021-11-23: qty 30

## 2021-11-23 MED ORDER — SODIUM CHLORIDE 0.9% FLUSH: 3.0000 mL | INTRAVENOUS | Status: DC | PRN

## 2021-11-23 MED ORDER — ASPIRIN 81 MG PO CHEW
CHEWABLE_TABLET | ORAL | Status: AC
  Administered 2021-11-23: 14:00:00 81 mg via ORAL
  Filled 2021-11-23: qty 1

## 2021-11-23 MED ORDER — HEPARIN (PORCINE) IN NACL 2000-0.9 UNIT/L-% IV SOLN
INTRAVENOUS | Status: AC
  Filled 2021-11-23: qty 1000

## 2021-11-23 MED ORDER — HEPARIN SODIUM (PORCINE) 1000 UNIT/ML IJ SOLN
INTRAMUSCULAR | Status: DC | PRN
  Administered 2021-11-23: 4000 [IU] via INTRAVENOUS

## 2021-11-23 MED ORDER — MIDAZOLAM HCL 2 MG/2ML IJ SOLN
INTRAMUSCULAR | Status: DC | PRN
  Administered 2021-11-23: .5 mg via INTRAVENOUS

## 2021-11-23 MED ORDER — HYDRALAZINE HCL 20 MG/ML IJ SOLN: 10.0000 mg | INTRAMUSCULAR | Status: DC | PRN

## 2021-11-23 MED ORDER — VERAPAMIL HCL 2.5 MG/ML IV SOLN
INTRAVENOUS | Status: DC | PRN
  Administered 2021-11-23: 17:00:00 10 mL via INTRA_ARTERIAL

## 2021-11-23 MED ORDER — FUROSEMIDE 10 MG/ML IJ SOLN
40.0000 mg | Freq: Once | INTRAMUSCULAR | Status: AC
  Administered 2021-11-23: 19:00:00 40 mg via INTRAVENOUS
  Filled 2021-11-23 (×2): qty 4

## 2021-11-23 MED ORDER — MIDAZOLAM HCL 2 MG/2ML IJ SOLN
INTRAMUSCULAR | Status: AC
  Filled 2021-11-23: qty 2

## 2021-11-23 MED ORDER — VERAPAMIL HCL 2.5 MG/ML IV SOLN
INTRAVENOUS | Status: AC
  Filled 2021-11-23: qty 2

## 2021-11-23 SURGICAL SUPPLY — 14 items
BAG SNAP BAND KOVER 36X36 (MISCELLANEOUS) ×1 IMPLANT
CATH 5FR JL3.5 JR4 ANG PIG MP (CATHETERS) ×1 IMPLANT
CATH BALLN WEDGE 5F 110CM (CATHETERS) ×1 IMPLANT
CATH LAUNCHER 5F JL3 (CATHETERS) IMPLANT
CATHETER LAUNCHER 5F JL3 (CATHETERS) ×2
DEVICE RAD COMP TR BAND LRG (VASCULAR PRODUCTS) ×1 IMPLANT
GLIDESHEATH SLEND SS 6F .021 (SHEATH) ×1 IMPLANT
GUIDEWIRE .025 260CM (WIRE) ×1 IMPLANT
GUIDEWIRE INQWIRE 1.5J.035X260 (WIRE) IMPLANT
INQWIRE 1.5J .035X260CM (WIRE) ×2
KIT HEART LEFT (KITS) ×3 IMPLANT
PACK CARDIAC CATHETERIZATION (CUSTOM PROCEDURE TRAY) ×3 IMPLANT
SHEATH GLIDE SLENDER 4/5FR (SHEATH) ×1 IMPLANT
TRANSDUCER W/STOPCOCK (MISCELLANEOUS) ×3 IMPLANT

## 2021-11-23 NOTE — Discharge Instructions (Signed)
1. Increase oxygen to 5L for now.  2. Restart Eliquis tomorrow morning.

## 2021-11-23 NOTE — Progress Notes (Signed)
Patients oxygen saturation has ranged from low 80's-90's on 5L oxygen. Patient is currently complaining of mild SOB and on 5L. Vin Bhagat, PA notified and gave verbal orders for Lasix 40 mg IV once. Orders placed and followed. Will continue to monitor.

## 2021-11-23 NOTE — Progress Notes (Signed)
Pt ate a whole reg lunch meal at 1100. Reported to [facility]

## 2021-11-23 NOTE — Progress Notes (Signed)
Purewick placed on patient and education provided. Patient voided 500 cc post Lasix administration. SOB improved as well as oxygen saturation.

## 2021-11-23 NOTE — Interval H&P Note (Signed)
History and Physical Interval Note:  11/23/2021 4:10 PM  Elizabeth Melton  has presented today for surgery, with the diagnosis of hf.  The various methods of treatment have been discussed with the patient and family. After consideration of risks, benefits and other options for treatment, the patient has consented to  Procedure(s): RIGHT/LEFT HEART CATH AND CORONARY ANGIOGRAPHY (N/A) as a surgical intervention.  The patient's history has been reviewed, patient examined, no change in status, stable for surgery.  I have reviewed the patient's chart and labs.  Questions were answered to the patient's satisfaction.     Uno Esau [employer]

## 2021-11-24 ENCOUNTER — Encounter (HOSPITAL_COMMUNITY): Payer: Self-pay | Admitting: Cardiology

## 2021-11-24 LAB — POCT I-STAT 7, (LYTES, BLD GAS, ICA,H+H)
Acid-Base Excess: 0 mmol/L (ref 0.0–2.0)
Acid-base deficit: 1 mmol/L (ref 0.0–2.0)
Bicarbonate: 22.5 mmol/L (ref 20.0–28.0)
Bicarbonate: 24.7 mmol/L (ref 20.0–28.0)
Calcium, Ion: 1.13 mmol/L — ABNORMAL LOW (ref 1.15–1.40)
Calcium, Ion: 1.2 mmol/L (ref 1.15–1.40)
HCT: 48 % — ABNORMAL HIGH (ref 36.0–46.0)
HCT: 48 % — ABNORMAL HIGH (ref 36.0–46.0)
Hemoglobin: 16.3 g/dL — ABNORMAL HIGH (ref 12.0–15.0)
Hemoglobin: 16.3 g/dL — ABNORMAL HIGH (ref 12.0–15.0)
O2 Saturation: 74 %
O2 Saturation: 91 %
Potassium: 4 mmol/L (ref 3.5–5.1)
Potassium: 4 mmol/L (ref 3.5–5.1)
Sodium: 141 mmol/L (ref 135–145)
Sodium: 142 mmol/L (ref 135–145)
TCO2: 23 mmol/L (ref 22–32)
TCO2: 26 mmol/L (ref 22–32)
pCO2 arterial: 32.3 mmHg (ref 32.0–48.0)
pCO2 arterial: 40.7 mmHg (ref 32.0–48.0)
pH, Arterial: 7.391 (ref 7.350–7.450)
pH, Arterial: 7.45 (ref 7.350–7.450)
pO2, Arterial: 37 mmHg — CL (ref 83.0–108.0)
pO2, Arterial: 62 mmHg — ABNORMAL LOW (ref 83.0–108.0)

## 2021-11-24 LAB — POCT I-STAT EG7
Acid-Base Excess: 1 mmol/L (ref 0.0–2.0)
Bicarbonate: 25.7 mmol/L (ref 20.0–28.0)
Calcium, Ion: 1.11 mmol/L — ABNORMAL LOW (ref 1.15–1.40)
HCT: 50 % — ABNORMAL HIGH (ref 36.0–46.0)
Hemoglobin: 17 g/dL — ABNORMAL HIGH (ref 12.0–15.0)
O2 Saturation: 42 %
Potassium: 4 mmol/L (ref 3.5–5.1)
Sodium: 142 mmol/L (ref 135–145)
TCO2: 27 mmol/L (ref 22–32)
pCO2, Ven: 40.2 mmHg — ABNORMAL LOW (ref 44.0–60.0)
pH, Ven: 7.414 (ref 7.250–7.430)
pO2, Ven: 24 mmHg — CL (ref 32.0–45.0)

## 2021-11-30 DIAGNOSIS — M810 Age-related osteoporosis without current pathological fracture: Secondary | ICD-10-CM | POA: Diagnosis not present

## 2021-11-30 DIAGNOSIS — I480 Paroxysmal atrial fibrillation: Secondary | ICD-10-CM | POA: Diagnosis not present

## 2021-11-30 DIAGNOSIS — I10 Essential (primary) hypertension: Secondary | ICD-10-CM | POA: Diagnosis not present

## 2021-11-30 DIAGNOSIS — E1169 Type 2 diabetes mellitus with other specified complication: Secondary | ICD-10-CM | POA: Diagnosis not present

## 2021-11-30 DIAGNOSIS — E1165 Type 2 diabetes mellitus with hyperglycemia: Secondary | ICD-10-CM | POA: Diagnosis not present

## 2021-11-30 DIAGNOSIS — I5032 Chronic diastolic (congestive) heart failure: Secondary | ICD-10-CM | POA: Diagnosis not present

## 2021-11-30 DIAGNOSIS — E039 Hypothyroidism, unspecified: Secondary | ICD-10-CM | POA: Diagnosis not present

## 2021-11-30 DIAGNOSIS — J449 Chronic obstructive pulmonary disease, unspecified: Secondary | ICD-10-CM | POA: Diagnosis not present

## 2021-11-30 DIAGNOSIS — E785 Hyperlipidemia, unspecified: Secondary | ICD-10-CM | POA: Diagnosis not present

## 2021-12-15 ENCOUNTER — Encounter: Payer: Self-pay | Admitting: Internal Medicine

## 2021-12-15 ENCOUNTER — Other Ambulatory Visit: Payer: Self-pay

## 2021-12-15 ENCOUNTER — Ambulatory Visit (INDEPENDENT_AMBULATORY_CARE_PROVIDER_SITE_OTHER): Payer: No Typology Code available for payment source | Admitting: Internal Medicine

## 2021-12-15 ENCOUNTER — Telehealth: Payer: Self-pay

## 2021-12-15 VITALS — BP 130/64 | HR 80 | Temp 98.1°F | Ht 66.0 in | Wt 164.6 lb

## 2021-12-15 DIAGNOSIS — J849 Interstitial pulmonary disease, unspecified: Secondary | ICD-10-CM | POA: Diagnosis not present

## 2021-12-15 DIAGNOSIS — J84112 Idiopathic pulmonary fibrosis: Secondary | ICD-10-CM

## 2021-12-15 DIAGNOSIS — I2723 Pulmonary hypertension due to lung diseases and hypoxia: Secondary | ICD-10-CM | POA: Diagnosis not present

## 2021-12-15 DIAGNOSIS — Z720 Tobacco use: Secondary | ICD-10-CM

## 2021-12-15 DIAGNOSIS — J9611 Chronic respiratory failure with hypoxia: Secondary | ICD-10-CM | POA: Diagnosis not present

## 2021-12-15 NOTE — Progress Notes (Signed)
Subjective:  Patient ID: Elizabeth Melton, female , DOB: 17-Mar-1941 , age 81 y.o. , MRN: [id number] , ADDRESS: [address] [place] [address]   09/04/2019 -   Chief Complaint  Patient presents with   Pulmonary Consult    Pt referred here by Dr. Drema Dallas for abnormal CT chest. Pt c/o DOE X years - states this is stable. Pt denies cough and CP/tightness and f/c/s.      HPI Elizabeth Melton 81 y.o. -presents to the interstitial lung disease clinic for evaluation of pulmonary fibrosis.  She was seen by the lung nodule program approximately a year ago when lung cancer screening CT chest showed interstitial lung disease changes.  The CT scan also showed a 7 mm right middle lobe nodule by the minor fissure.  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  [.  We then asked her to do interstitial lung disease questionnaire and serology and show for the clinic.  However she did not.  She is here today a year later.  In the interim she did have another CT scan of the chest.  The CT scan of the chest is not high-resolution CT chest.  I visualized the scan.  The 7 mm right middle lobe nodule has enlarged.  The ILD changes are still present.  She tells me that she skipped visit a year ago because of insurance issues.  [facility] Comprehensive ILD Questionnaire  Symptoms: Reports insidious onset of shortness of breath for the last few to several years.  He does the same since onset.  She also reports its episodic dyspnea.  SYMPTOM SCALE - ILD 09/04/2019   O2 use ra  Shortness of Breath 0 -> 5 scale with 5 being worst (score 6 If unable to do)  At rest 0  Simple tasks - showers, clothes change, eating, shaving 3  Household (dishes, doing bed, laundry) 3  Shopping 3  Walking level at own pace 3  Walking keeping up with others  of same age 71  Walking up Stairs 4.5  Walking up Hill 4.5  Total (40 - 48) Dyspnea Score x  How bad is your cough? x  How bad is your fatigue x    Her activities are limited by joint pain.  She also reports a cough it is the same since it started.  It is mild in intensity.  Occasionally brings out clear white phlegm.  It does affect her voice and she does clear her throat and she does feel a tickle in her throat.  No hemoptysis.    Past Medical History : Positive for diabetes blood clots not otherwise specified but negative for asthma, COPD, heart failure, rheumatoid arthritis, scleroderma, lupus, polymyositis, Sjogren's, GERD or hiatal hernia, sleep apnea, pulmonary hypertension, stroke.  No seizures.  Denies any heart disease or pleurisy   ROS: Positive for fatigue for the last few years and arthralgia for the last few years.  Also dry mouth for the last few years.  Does admit to snoring for unclear amount of time.  Has nonspecific rash for the last few weeks.  Denies any dysphagia or acid reflux or weight loss or ulcers in the mouth or vagina.   FAMILY HISTORY of LUNG DISEASE: Denies any COPD or pulmonary fibrosis or hypersensitivity pneumonitis   EXPOSURE  HISTORY: Continues to smoke cigarettes since age 11.  He currently smokes 10 cigarettes a day.  She smokes cigars in the past.  Never smoked.  No electronic cigarette use.  No marijuana use currently but did smoke marijuana 20 years ago.  No cocaine use.  No intravenous drug use.   HOME and HOBBY DETAILS : Lives in a single-family [place] home for the last 34 years.  Extensive organic antigen exposure in the house history is negative.  In particular noted to have environment.  No more than will be on the shower curtain.  No mold or mildew anywhere in the house.  Does use a humidifier but no more than it.  Does not use a steam iron.  Does not have a Jacuzzi of swimming pool.  No misting found inside the house.  No pet birds or parakeets.   No pet gerbils.  Does not use feather pillows.  Does not play any wind instruments.  Does not do much gardening.   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  Has worked as a Theme park manager.  Has worked as a Insurance claims handler with cosmetics.  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  Details are not known.  She has worked opening In jars of relaxer chemicals and during this time was exposed with his gas and fumes.  Rest are negative.   PULMONARY TOXICITY HISTORY (27 items):  -Denies         10/23/2019 Follow up : ILD , O2 RF  Patient returns for a 43-monthfollow-up.  She was seen for pulmonary consult last visit for possible pulmonary fibrosis noted on screening CT chest.  Patient is currently an active smoker.  Pulmonary function testing done today shows no airflow obstruction or restriction.  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  Moderate emphysema.  Dilation of pulmonary trunk concerning for pulmonary artery hypertension.  And aortic atherosclerosis. Overnight oximetry test 09/19/19/2020 showed nocturnal desaturations.  Patient was started on oxygen 2 L at nighttime.   Feels that since starting oxygen she is feeling better especially in the morning hours. She says she does get short of breath with activity.  But remains active and is able to do all of her housework and cooking without any difficulty.  Says she does occasionally have to stop and rest.  She does have some cough that is minimally productive. We discussed her test results.  And recent CT chest findings. She does not want to begin meds at this time, did discuss that she would discuss later dater if necessay but does not want to .   We discussed smoking cessation.  Declines pharm referral for smoking cessation   No Known Allergies  Study Conclusions   - Left  ventricle: The cavity size was normal. Wall thickness was    normal. Systolic function was normal. The estimated ejection    fraction was in the range of 60% to 65%. Wall motion was normal;    there were no regional wall motion abnormalities. Normal GLS at    -18%. Doppler parameters are consistent with abnormal left    ventricular relaxation (grade 1 diastolic dysfunction). The E/e&'    ratio is >15, suggesting elevated LV filling pressure.  - Aortic valve: Trileaflet. Sclerosis without stenosis. There was    mild to moderate regurgitation.  - Mitral valve: Mildly thickened leaflets .  There was mild to    moderate regurgitation.  - Left atrium: Moderately dilated.  - Tricuspid valve: There was moderate regurgitation.  - Pulmonary arteries: PA peak pressure: 48 mm Hg (S).  - Inferior vena cava: The vessel was normal in size. The    respirophasic diameter changes were in the normal range (>= 50%),    consistent with normal central venous pressure.   Impressions:   - Compared to a prior study in 2017, there are few changes. There    is mild to moderate AI, MR and moderate TR with a stable RVSP of    48 mmHg.    ROS - per HPI   OV 02/25/2020  Subjective:  Patient ID: Elizabeth Melton, female , DOB: 04-11-1941 , age 38 y.o. , MRN: [id number] , ADDRESS: 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 -presents for follow-up.  Last visit with me was September 2020.  In the interim followed up with nurse practitioner nov 2020.  Initially was given nighttime oxygen.  She said it  helped but later she is now saying it did not help.  So she discontinued it.  She says it has made no difference.  In the interim she is continue to smoke.  She has significant amount of dyspnea burden as documented below she is also very fatigued.  However she is more bothered right now by her left knee pain and left ankle pain.  She is uses a cane chronically but the pain in her left ankle is been present only for a few days.  She got a talk about this with her primary care physician.  She continues to smoke.  She is not on any inhalers for emphysema.  At last visit with nurse practitioner she was reluctant to take antifibrotic's.     OV 11/22/2020   Subjective:  Patient ID: Elizabeth Melton, female , DOB: October 22, 1941, age 38 y.o. years. , MRN: [id number],  ADDRESS: [place] [id number] PCP  Leighton Ruff, MD Providers : Treatment Team:  Attending Provider: Brand Males, MD Patient Care Team: Leighton Ruff, MD as PCP - General (Family Medicine) Dorothy Spark, MD as PCP - Cardiology (Cardiology)    Chief Complaint  Patient presents with   Follow-up    Breathing is unchanged since her last visit.     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 -returns for her ILD follow-up.  Last seen in spring 2021.  After that a try to set her up for a right heart catheterization but in the interim she got pulmonary embolism in September 2021.  She has seen Dr. Jenetta Downer for sleep apnea work-up.  She continues to smoke.  For associated emphysema she takes Spiriva.  For her ILD she continues to be reluctant to have any biopsy or take antifibrotic's.  She expresses again to me today.  She does not want a right heart catheterization either.  On the other hand she is willing to have an expectant follow-up with serial monitoring.  This is because she is afraid of side effects.  Overall she is stable.  Not using oxygen.  She is more limited by sciatic pain in her feet.  This was evidenced in a walking desaturation test today.  Symptom scores are listed below.  In terms of pulmonary embolism: We discussed potential risk factors.  She denies any family history but it appears obesity sedentary lifestyle pulmonary fibrosis and ongoing smoking or risk factors.  I counseled her about quitting smoking.  She plans to do that.   Results for Elizabeth Melton, Elizabeth Melton (MRN [id number]) as of 02/25/2020 12:29  Ref. Range 09/04/2019 14:19  CK Total Latest Ref Range: 29 - 143 U/L 196 (H)   Results for Elizabeth Melton, Elizabeth Melton (MRN [id number]) as of 02/25/2020 12:29  Ref. Range 09/04/2019 14:19  Anti Nuclear Antibody (ANA) Latest Ref Range: NEGATIVE  POSITIVE (A)  ANA Pattern 1 Unknown Cytoplasmic (A)  ANA Titer 1 Latest Units: titer 1:80 (H)  Results for Elizabeth Melton, Elizabeth Melton (MRN [id number]) as of 02/25/2020 12:29  Ref. Range 09/04/2019 14:19  Sed Rate Latest Ref Range: 0 - 30 mm/hr 40 (H)       OV 06/02/2021  Subjective:  Patient  ID: Elizabeth Melton, female , DOB: 07-14-1941 , age 34 y.o. , MRN: [id number] , ADDRESS: [address] [place] [place] [address] PCP Kathyrn Lass, MD Patient Care Team: Kathyrn Lass, MD as PCP - General (Family Medicine) [name], MD as PCP - Cardiology (Cardiology)  This Provider for this visit: Treatment Team:  Attending Provider: Brand Males, MD    06/02/2021 -   Chief Complaint  Patient presents with   Follow-up    Pt states that she is about the same since last visit. Still becomes SOB with exertion and has an occ cough.   HPI Elizabeth Melton 81 y.o. -last seen in December 2021.  She was supposed to see me in the spring 2022 with CT scan and PFT but this did not get done.  Review of the chart indicates she had a diagnosis of systolic heart failure in February 2022.  She had another CT angiogram chest that ruled out pulmonary embolism.  Then she had another admission in June 2022 with acute on chronic heart failure exacerbation but this time the echo showed diastolic dysfunction grade 2 and severe pulmonary hypertension.  She was admitted.  She had both pulmonary and cardiology consultation.  Somewhere along the way according to her history in April 2022 ended up on 3 L oxygen and also using a Rollator.  She got discharged to her daughter's home.  She currently feeling  improved and stable requiring 3 L continuous.  Her functional status is deteriorated although she is feeling better compared to the hospitalization.  Her son Elizabeth Melton is here with her for the first time meeting him.  He is unaware of pulmonary cardiac issues in detail.  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  After this discussion she seems more willing to entertain the concept of antifibrotic's.  At this point in time with oxygen need lung biopsy could be high risk.  We talked about right heart catheterization and she is  open to the idea.  I sent a message Dr. Beatrix Fetters her cardiologist.      CT Chest data  ECHOCARDIOGRAM COMPLETE  Result Date: 05/30/2021    ECHOCARDIOGRAM REPORT   Patient Name:   Elizabeth Melton Date of Exam: 05/30/2021 Medical Rec #:  [id number]          Height:       66.0 in Accession #:    [id number]         Weight:       170.0 lb Date of Birth:  22-Feb-1941          BSA:          1.866 m Patient Age:    26 years           BP:           142/50 mmHg Patient Gender: F                  HR:           59 bpm. Exam Location:  [facility] Procedure: 2D Echo, Cardiac Doppler and Color Doppler Indications:    I50.9 Congestive heart failure  History:        Patient has prior history of Echocardiogram examinations, most                 recent 05/19/2021. Risk Factors:Hypertension, Diabetes and                 Dyslipidemia. Hypothyroidism. Acute systolic heart failure.  Sonographer:    Wilford Sports Rodgers-Jones RDCS Referring Phys: [id number] [place]  1. Left ventricular ejection fraction, by estimation, is 60 to 65%. The left ventricle has normal function. The left ventricle has no regional wall motion abnormalities. Left ventricular diastolic parameters are consistent with Grade II diastolic dysfunction (pseudonormalization). Elevated left ventricular end-diastolic pressure.  2. Right ventricular systolic function is low normal. The right ventricular size is mildly enlarged. There is severely elevated pulmonary artery systolic pressure. The estimated right ventricular systolic pressure is 15.1 mmHg.  3. Left atrial size was moderately dilated.  4. Right atrial size was mild to moderately dilated.  5. The mitral valve is degenerative. Moderate mitral valve regurgitation. No evidence of mitral stenosis. Moderate mitral annular calcification.  6. Tricuspid valve regurgitation is moderate.  7. The aortic valve is calcified. There is moderate calcification of the aortic valve. There is  moderate thickening of the aortic valve. Aortic valve regurgitation is mild to moderate. Mild aortic valve stenosis.  8. Aortic dilatation noted. There is borderline dilatation of the ascending aorta, measuring 38 mm. Comparison(s): No significant change from prior study. FINDINGS  Left Ventricle: Left ventricular ejection fraction, by estimation, is 60 to 65%. The left ventricle has normal function. The left ventricle has no regional wall motion abnormalities. The left ventricular internal cavity size was  normal in size. There is  no left ventricular hypertrophy. Left ventricular diastolic parameters are consistent with Grade II diastolic dysfunction (pseudonormalization). Elevated left ventricular end-diastolic pressure. Right Ventricle: The right ventricular size is mildly enlarged. Right vetricular wall thickness was not well visualized. Right ventricular systolic function is low normal. There is severely elevated pulmonary artery systolic pressure. The tricuspid regurgitant velocity is 3.69 m/s, and with an assumed right atrial pressure of 8 mmHg, the estimated right ventricular systolic pressure is 16.1 mmHg. Left Atrium: Left atrial size was moderately dilated. Right Atrium: Right atrial size was mild to moderately dilated. Pericardium: There is no evidence of pericardial effusion. Mitral Valve: The mitral valve is degenerative in appearance. There is mild thickening of the mitral valve leaflet(s). There is mild calcification of the mitral valve leaflet(s). Moderate mitral annular calcification. Moderate mitral valve regurgitation.  No evidence of mitral valve stenosis. Tricuspid Valve: The tricuspid valve is normal in structure. Tricuspid valve regurgitation is moderate . No evidence of tricuspid stenosis. Aortic Valve: The aortic valve is calcified. There is moderate calcification of the aortic valve. There is moderate thickening of the aortic valve. Aortic valve regurgitation is mild to moderate. Aortic  regurgitation PHT measures 625 msec. Mild aortic stenosis is present. Aortic valve mean gradient measures 7.2 mmHg. Aortic valve peak gradient measures 15.2 mmHg. Aortic valve area, by VTI measures 1.24 cm. Pulmonic Valve: The pulmonic valve was not well visualized. Pulmonic valve regurgitation is not visualized. No evidence of pulmonic stenosis. Aorta: Aortic dilatation noted. There is borderline dilatation of the ascending aorta, measuring 38 mm. Venous: The inferior vena cava was not well visualized. IAS/Shunts: The atrial septum is grossly normal.  LEFT VENTRICLE PLAX 2D LVIDd:         4.40 cm  Diastology LVIDs:         2.92 cm  LV e' medial:    5.77 cm/s LV PW:         1.00 cm  LV E/e' medial:  20.6 LV IVS:        1.00 cm  LV e' lateral:   5.98 cm/s LVOT diam:     1.70 cm  LV E/e' lateral: 19.9 LV SV:         54 LV SV Index:   29 LVOT Area:     2.27 cm  RIGHT VENTRICLE RV Basal diam:  4.30 cm RV S prime:     12.70 cm/s TAPSE (M-mode): 1.7 cm LEFT ATRIUM             Index       RIGHT ATRIUM           Index LA diam:        5.10 cm 2.73 cm/m  RA Area:     18.30 cm LA Vol (A2C):   77.0 ml 41.26 ml/m RA Volume:   49.90 ml  26.74 ml/m LA Vol (A4C):   96.4 ml 51.66 ml/m LA Biplane Vol: 88.5 ml 47.42 ml/m  AORTIC VALVE AV Area (Vmax):    1.14 cm AV Area (Vmean):   1.21 cm AV Area (VTI):     1.24 cm AV Vmax:           195.25 cm/s AV Vmean:          128.250 cm/s AV VTI:            0.436 m AV Peak Grad:      15.2 mmHg AV Mean Grad:  7.2 mmHg LVOT Vmax:         97.90 cm/s LVOT Vmean:        68.400 cm/s LVOT VTI:          0.238 m LVOT/AV VTI ratio: 0.55 AI PHT:            625 msec  AORTA Ao Root diam: 3.10 cm Ao Asc diam:  3.80 cm MITRAL VALVE                TRICUSPID VALVE MV Area (PHT): 3.60 cm     TR Peak grad:   54.5 mmHg MV Decel Time: 211 msec     TR Vmax:        369.00 cm/s MR Peak grad: 136.4 mmHg MR Vmax:      584.00 cm/s   SHUNTS MV E velocity: 119.00 cm/s  Systemic VTI:  0.24 m MV A velocity:  97.00 cm/s   Systemic Diam: 1.70 cm MV E/A ratio:  1.23 Buford Dresser MD Electronically signed by Buford Dresser MD Signature Date/Time: 05/30/2021/3:42:20 PM    Final       OV 08/09/2021  Subjective:  Patient ID: Elizabeth Melton, female , DOB: [birth year] , age 7 y.o. 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 Haroldine Laws or [facility] in spring 2021 but then held off dur to PE in sept 2021  - Dr Dorris Fetch is her cardiologist in 1607   Chronic systolic heart failure with most recent echocardiogram in February 2022 showing LVEF 40 to 45%,  - > resolved June 2022   Submassive pulmonary embolism September 2021 with RV LV ratio 1.5 and saddle pulmonary emboli  -On Eliquis  July 2022 right heart catheterization -   Severe Mixed Pulmonary Hypertension-improved with oxygen: TPG 19 mmHg Mean PAP 47 mmHg this is a 36 mmHg with 3 L [place] PCWP  elevated at 28 mmHg Severely reduced Cardiac Output-Index by both Fick (3.04-1.65) and Thermodilution (3.96-2.15)  modestly improves with 3L [place] O2 (3.21-1.74) Significant oxygen responsiveness-SaO2 dropped to 83% on room air, improved to 98% on 3L [place] oxygen.      08/09/2021 -   Chief Complaint  Patient presents with   Follow-up    Pt was recently admitted to the hospital. States she has been doing better since she has been out. Still becomes SOB with exertion.     HPI Elizabeth Melton 81 y.o. -she had right heart catheterization in July 2022.  This showed mixed pulmonary artery hypertension and pulmonary venous hypertension.  She had a high wedge.  Her oral Lasix was increased as outpatient but she did not take it because of concern of increased frequency of urination.  This then made a more hypoxemic and she got hospitalized for a few days early August 2022.  She was discharged on a prednisone taper that she is completed.  She her Lasix was increased and she was also given IV Lasix.  This helped improve her.  Currently her symptoms are back to baseline although she is requiring more like 4 L oxygen based on subjective needs.  Her smoking has relapsed.  She did have a CT scan of the chest without contrast in the hospital and the features of changed from probable UIP to definitive UIP.  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  Consistent with this the pulmonary function test today shows decline.  Therefore she is got progressive ILD which is again fitting in with IPF.  Unfortunately she has began to smoke again.  Smoking 3 to 4 cigarettes/day.  Spoke to her extensively about the need to quit.  She really wants improvement in her symptoms.  We discussed inhaled treprostinil.  I am not sure she understood in detail.  Her son Elizabeth Melton was not at the room today with her.  On one side she says she will do anything to make her feel better but there is a history of medication noncompliance and also concerns with  side effects.  Given the mixed picture her pulmonary hypertension could be group 2 from her left heart disease, could be group 3 from pulmonary fibrosis or could be group for from chronic thromboembolic disease [she has submassive PE a year ago in September 2021] therefore I suggested that when she sees cardiologist she has entertain the possibility of a repeat right heart catheterization after ensuring full compliance with Lasix.  She understood this.  We discussed antifibrotic's.  She seemed more open but then when I mention the side effect she was not that open to the idea.  Most recent renal function in August 2022 was normal   [facility] 07/05/21 Hemodynamic findings consistent with severe pulmonary hypertension.   Severe Mixed Pulmonary Hypertension-improved with oxygen: TPG 19 mmHg Mean PAP 47 mmHg this is a 36 mmHg with 3 L [place] PCWP elevated at 28 mmHg Severely reduced Cardiac Output-Index by both Fick (3.04-1.65) and Thermodilution (3.96-2.15)  modestly improves with 3L [place] O2 (3.21-1.74) Significant oxygen responsiveness-SaO2 dropped to 83% on room air, improved to 98% on 3L [place] oxygen.     Glenetta Hew, MD     OV 12/15/2021  Subjective:  Patient ID: Elizabeth Melton, female , DOB: June 19, 1941 , age 71 y.o. , MRN: [id number] , ADDRESS: [place] [id number] PCP Kathyrn Lass, MD Patient Care Team: Kathyrn Lass, MD as PCP - General (Family Medicine) [name], MD as PCP - Cardiology (Cardiology) Florance, Tomasa Blase, RN as [facility]  This Provider for this visit: Treatment Team:  Attending Provider: Brand Males, MD    12/15/2021 -   Chief Complaint  Patient presents with   Follow-up    Pt states her breathing has become worse since last visit.    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    HPI Elizabeth Melton 81 y.o. -returns for follow-up.  Last seen in the summer 2022.  Since then had another 5-day admission ending around Thanksgiving 2022.  This was also for acute on chronic diastolic heart failure.  She had another echocardiogram that showed reduced right ventricular systolic fraction.  She underwent diuresis.  After the discharge she underwent another cardiac catheterization 11/23/2021 that showed low cardiac output nonobstructive mild coronary artery disease severe pulmonary arterial hypertension normal filling pressures.  She has upcoming appointment with  cardiologist Dr. Loralie Champagne 12/20/2021  She continues to smoke.  She states she lives in a second floor apartment.  She has no home health care.  She is open to the idea of home palliative care.  She again is a poor historian.  We discussed the pulmonary fibrosis.  Last CT scan was in July 2022.  She was supposed to have pulmonary function test today but she misunderstood the instructions about COVID testing.  Therefore this is not done.  She is now willing to get retested.  She is more open to the idea of antifibrotic's.6  Pressures RHC Procedural Findings: Hemodynamics (mmHg) RA mean 1 RV 70/5 PA 73/25, mean 42 PCWP mean 14 LV 138/13 AO 132/43  Oxygen saturations: PA 42% AO 71%  Cardiac Output (Fick) 3.62  Cardiac Index (Fick) 1.97 PVR 7.7 WU        SYMPTOM SCALE - ILD 02/25/2020  11/22/2020  06/02/2021  08/09/2021  12/15/2021   O2 use ra ra 3L [place] since April 2022 along with rollator 4 LNC - admit earlyu aug 2022. RHC  - mixed grp 2-3 pah July 2022 4L [place] - admit nov 2022 for CHF And diuresed  Shortness of Breath 0 -> 5 scale with 5 being worst (score 6 If unable to do)      At rest 1.5 0 1 0 0  Simple tasks - showers, clothes change, eating, shaving _0 Household (dishes, doing bed, laundry) 2._1 Shopping 3._2 Walking level at own pace 3._3 Walking up Stairs 4._4 Total (30-36) Dyspnea Score 18._5 How bad is your cough? 0 0 0 0  0  How bad is your fatigue 2.5 3  2.5 4  How bad is nausea 0 0  none 3  How bad is vomiting?  0 0  0 0  How bad is diarrhea? 0 2  0 0  How bad is anxiety? 0 2  2.5 4  How bad is depression 0 2  2.5 3         Simple office walk 185 feet x  3 laps goal with forehead probe 09/04/2019  02/25/2020  11/22/2020   O2 used RA ra ra  Number laps completed 3 attempted but at stopped at 1 due to dyspnea an left knee pain Did not walk due to left heel pain. Has cane Did only 1 lap - stopped due to  feet burn  Comments about pace slow    Resting Pulse Ox/HR 99% and 66/min  97% and 77/min  Final Pulse Ox/HR 93% and 105/min  94% and 105/min  Desaturated </= 88% No but did not do all 3  No,  Desaturated <= 3% points yes  Uyes, 3 points  Got Tachycardic >/= 90/min Yes, 6 points  yes  Symptoms at end of test Dyspnea and left knee pain  Stopped due to feet pain and some dyspnea but mostly feet  Miscellaneous comments Stopped early due to knee pain and dyspnea       PFT  PFT Results Latest Ref Rng & Units 08/01/2021 03/17/2020 10/23/2019  FVC-Pre L 1.80 2.15 2.10  FVC-Predicted Pre % 89 103 100  Pre FEV1/FVC % % 66 74 73  FEV1-Pre L 1.18 1.59 1.53  FEV1-Predicted Pre % 76 99 95  DLCO uncorrected ml/min/mmHg 5.69 8.93 8.62  DLCO UNC% % 30 47 46  DLCO corrected ml/min/mmHg 5.69 8.93 -  DLCO COR %Predicted % 30 47 -  DLVA Predicted % 46 72 59       has a past medical history of Acute on chronic diastolic (congestive) heart failure ([place]) (05/19/2021), Diabetes mellitus without complication ([place]), Hyperlipidemia, Hypertension, Hypothyroidism, IPF (idiopathic pulmonary fibrosis) ([place]), and Thrombocytopenia ([place]).   reports that she has been smoking cigarettes. She started smoking about 63 years ago. She has a 60.00 pack-year smoking history. She quit smokeless tobacco use about 59 years ago.  Past Surgical History:  Procedure Laterality Date   RIGHT HEART CATH N/A 07/05/2021   Procedure: RIGHT HEART CATH;  Surgeon: [name], MD;  Location: [facility];  Service: Cardiovascular;  Laterality: N/A;   RIGHT/LEFT HEART CATH AND CORONARY ANGIOGRAPHY N/A 11/23/2021   Procedure: RIGHT/LEFT HEART CATH AND CORONARY ANGIOGRAPHY;  Surgeon: Larey Dresser, MD;  Location: [facility];  Service: Cardiovascular;  Laterality: N/A;   TUBAL LIGATION      Allergies  Allergen Reactions   Lisinopril Other (See Comments)    Other reaction(s): cough 09/20/17    Immunization  History  Administered Date(s) Administered   Pneumococcal Conjugate-13 06/03/2014   Pneumococcal Polysaccharide-23 06/25/2007    Family History  Problem Relation Age of Onset   Diabetes Mother    Kidney disease Mother    Hypertension Mother    Other Father        tuberculosis   Hypertension Sister    Hyperlipidemia Sister      Current Outpatient Medications:    acetaminophen (TYLENOL) 500 MG tablet, Take 1,000 mg by mouth every 8 (eight) hours as needed for headache or mild pain (pain)., Disp: , Rfl:    apixaban (ELIQUIS) 5  MG TABS tablet, Take 1 tablet (5 mg total) by mouth 2 (two) times daily., Disp: 56 tablet, Rfl: 0   b complex vitamins tablet, Take 1 tablet by mouth daily., Disp: , Rfl:    Blood Glucose Monitoring Suppl (TRUE METRIX METER) w/Device KIT, 1 each by Other route in the morning and at bedtime., Disp: , Rfl:    buPROPion (WELLBUTRIN XL) 150 MG 24 hr tablet, Take 150 mg by mouth daily., Disp: , Rfl:    Calcium Carb-Cholecalciferol (CALCIUM + D3) 600-200 MG-UNIT TABS, Take 2 tablets by mouth daily., Disp: , Rfl:    glucose blood test strip, 1 each by Other route as needed for other (blood sugar)., Disp: , Rfl:    insulin aspart (NOVOLOG) 100 UNIT/ML FlexPen, Insulin sliding scale: Blood sugar  120-150   3units                       151-200   4units                       201-250   7units                       251- 300  11units                       301-350   15uints                       351-400   20units                       >400         call MD immediately (Patient taking differently: 3-7 Units daily. Insulin sliding scale: Blood sugar  120-150   3units                       151-200   4units                       201-250   7units                       251- 300  11units                       301-350   15uints                       351-400   20units                       >400         call MD immediately), Disp: 15 mL, Rfl: 0   Insulin Pen Needle (PEN NEEDLES 29GX1/2") 29G X  12MM MISC, For insulin injection, Disp: 50 each, Rfl: 0   levothyroxine (SYNTHROID, LEVOTHROID) 75 MCG tablet, Take 75 mcg by mouth daily before breakfast. , Disp: , Rfl:    lovastatin (MEVACOR) 40 MG tablet, Take 40 mg by mouth at bedtime., Disp: , Rfl:    metoprolol succinate (TOPROL-XL) 25 MG 24 hr tablet, Take 0.5 tablets (12.5 mg total) by mouth daily., Disp: 30 tablet, Rfl: 3   nicotine (NICODERM CQ - DOSED IN MG/24 HOURS) 14 mg/24hr patch, Place 14 mg onto the skin daily., Disp: , Rfl:  Omega-3 Fatty Acids (FISH OIL) 1000 MG CAPS, Take 1,000 mg by mouth daily., Disp: , Rfl:    OXYGEN, Inhale 4 L into the lungs continuous., Disp: , Rfl:    potassium chloride (KLOR-CON) 10 MEQ tablet, Take 2 tablets (20 mEq total) by mouth daily., Disp: 60 tablet, Rfl: 3   SMART SENSE THIN LANCETS 26G MISC, 1 each by Does not apply route 2 (two) times daily., Disp: , Rfl:    torsemide (DEMADEX) 20 MG tablet, Take 2 tablets (40 mg total) by mouth in the morning AND 1 tablet (20 mg total) every evening. (Patient taking differently: Take 80 mg total) by mouth in the morning AND 1 tablet (40 mg total) every evening.), Disp: 90 tablet, Rfl: 3   VITAMIN D PO, Take 1,200 Units by mouth daily., Disp: , Rfl:   Current Facility-Administered Medications:    sodium chloride flush (NS) 0.9 % injection 3 mL, 3 mL, Intravenous, Q12H, [name], MD      Objective:   Vitals:   12/15/21 0945  BP: 130/64  Pulse: 80  Temp: 98.1 F (36.7 C)  TempSrc: Oral  SpO2: 93%  Weight: 164 lb 9.6 oz (74.7 kg)  Height: _0  (1.676 m)    Estimated body mass index is 26.57 kg/m as calculated from the following:   Height as of this encounter: _1  (1.676 m).   Weight as of this encounter: 164 lb 9.6 oz (74.7 kg).  _2 @  Filed Weights   12/15/21 0945  Weight: 164 lb 9.6 oz (74.7 kg)     Physical Exam  General: No distress. Looks well. Has walker Neuro: Alert and Oriented x 3. GCS 15. Speech  normal Psych: Pleasant Resp:  Barrel Chest - no.  Wheeze - no, Crackles - YES, No overt respiratory distress CVS: Normal heart sounds. Murmurs - no Ext: Stigmata of Connective Tissue Disease - no HEENT: Normal upper airway. PEERL +. No post nasal drip. MILD EDEMA +        Assessment:       ICD-10-CM   1. ILD (interstitial lung disease) ([place])  J84.9     2. WHO group 3 pulmonary arterial hypertension (HCC)  I27.23     3. Chronic respiratory failure with hypoxia (HCC)  J96.11     4. IPF (idiopathic pulmonary fibrosis) ([place])  J84.112     5. Tobacco abuse  Z72.0          Plan:     Patient Instructions  Lung nodule - 60m Right Middle Lobe Sept 2019 -> Aug 2020 with increase - stable Nov 2020 -> no comment on CT June 2022  Plan  -as needed followup  Smoking  -  relapsed 3-4 cigs  perday  Plan  - please quit  Interstitial Lung Disease/UIP/IPF  - you likely have  a variety called IPF  ; this is generally progerssive and there is evidence for progression - Previously reluctant to start antifibrotic's  but now you might reconsider  anti fibrotics    PLAN -  -Do spirometry and DLCO in  3 motnths - do HRCT supine and prone in 3 months - continue 3L [place] - 4L [place]  Pulmonary emphysema   Stable disease without flare up  Plan -Continue Spiriva - continue albuterol as neded   Pulmonary artery enlargement on CT and 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 (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)  -- Right heart cath in July  2022 and dec 2022 showed severe pulmonary hypertension due to - you seem better from heart after admission august 2022 and nov 2022 after lasix  Plan- -continue Eliquis --might be  a candidate for an inhaler called TYVASO - Dr Johnn Hai is seeing you later this month and can be decided then  Chronic respiratory failure with o2 need due to above  Plan  - continue 3-4 L [place] - refer home palliative care for symptom  support  Followup   - 3  months but after completing the above; Dr. [name] in 30-minute slot  - bring son Cheri Rous    Dr. Brand Males, M.D., F.C.C.P,  Pulmonary and Critical Care Medicine Staff Physician, [place] Director - Interstitial Lung Disease  Program  Pulmonary [place] at [place], [place], [address]  Pager: [phone], If no answer or between  15:00h - 7:00h: call [phone] Telephone: [phone]  10:31 AM 12/15/2021

## 2021-12-15 NOTE — Patient Instructions (Addendum)
Lung nodule - 48m Right Middle Lobe Sept 2019 -> Aug 2020 with increase - stable Nov 2020 -> no comment on CT June 2022  Plan  -as needed followup  Smoking  -  relapsed 3-4 cigs  perday  Plan  - please quit  Interstitial Lung Disease/UIP/IPF  - you likely have  a variety called IPF  ; this is generally progerssive and there is evidence for progression - Previously reluctant to start antifibrotic's  but now you might reconsider  anti fibrotics    PLAN -  -Do spirometry and DLCO in  3 motnths - do HRCT supine and prone in 3 months - continue 3L [place] - 4L [place]  Pulmonary emphysema   Stable disease without flare up  Plan -Continue Spiriva - continue albuterol as neded   Pulmonary artery enlargement on CT and 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 (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)  -- Right heart cath in July 2022 and dec 2022 showed severe pulmonary hypertension due to - you seem better from heart after admission august 2022 and nov 2022 after lasix  Plan- -continue Eliquis --might be  a candidate for an inhaler called TYVASO - Dr MJohnn Haiis seeing you later this month and can be decided then  Chronic respiratory failure with o2 need due to above  Plan  - continue 3-4 L [place] - refer home palliative care for symptom support  Followup   - 3  months but after completing the above; Dr. RChase Callerin 30-minute slot  - bring son AElberta Fortis

## 2021-12-15 NOTE — Addendum Note (Signed)
Addended by: Lorretta Harp on: 12/15/2021 10:34 AM   Modules accepted: Orders

## 2021-12-15 NOTE — Telephone Encounter (Signed)
Spoke with patient and scheduled an in-person Palliative Consult for 12/23/21 @ [facility] screening was negative. No pets in home. Patient's daughter lives with her.   Consent obtained; updated Outlook/Netsmart/Team List and Epic.   Patient is aware she may be receiving a call from provider the day before or day of to confirm appointment.

## 2021-12-20 ENCOUNTER — Telehealth (HOSPITAL_COMMUNITY): Payer: Self-pay | Admitting: Pharmacist

## 2021-12-20 ENCOUNTER — Other Ambulatory Visit: Payer: Self-pay

## 2021-12-20 ENCOUNTER — Other Ambulatory Visit (HOSPITAL_COMMUNITY): Payer: Self-pay

## 2021-12-20 ENCOUNTER — Encounter (HOSPITAL_COMMUNITY): Payer: Self-pay | Admitting: Cardiology

## 2021-12-20 ENCOUNTER — Ambulatory Visit (HOSPITAL_COMMUNITY)
Admission: RE | Admit: 2021-12-20 | Discharge: 2021-12-20 | Disposition: A | Discharge status: Home / Self Care | Payer: No Typology Code available for payment source | Source: Ambulatory Visit | Attending: Cardiology | Admitting: Cardiology

## 2021-12-20 VITALS — BP 110/68 | HR 84 | Wt 166.8 lb

## 2021-12-20 DIAGNOSIS — R0789 Other chest pain: Secondary | ICD-10-CM | POA: Insufficient documentation

## 2021-12-20 DIAGNOSIS — G4719 Other hypersomnia: Secondary | ICD-10-CM

## 2021-12-20 DIAGNOSIS — Z8616 Personal history of COVID-19: Secondary | ICD-10-CM | POA: Diagnosis not present

## 2021-12-20 DIAGNOSIS — Z79899 Other long term (current) drug therapy: Secondary | ICD-10-CM | POA: Insufficient documentation

## 2021-12-20 DIAGNOSIS — I5032 Chronic diastolic (congestive) heart failure: Secondary | ICD-10-CM

## 2021-12-20 DIAGNOSIS — I2510 Atherosclerotic heart disease of native coronary artery without angina pectoris: Secondary | ICD-10-CM | POA: Diagnosis not present

## 2021-12-20 DIAGNOSIS — I2699 Other pulmonary embolism without acute cor pulmonale: Secondary | ICD-10-CM | POA: Insufficient documentation

## 2021-12-20 DIAGNOSIS — J84112 Idiopathic pulmonary fibrosis: Secondary | ICD-10-CM | POA: Insufficient documentation

## 2021-12-20 DIAGNOSIS — R053 Chronic cough: Secondary | ICD-10-CM | POA: Insufficient documentation

## 2021-12-20 DIAGNOSIS — I083 Combined rheumatic disorders of mitral, aortic and tricuspid valves: Secondary | ICD-10-CM | POA: Insufficient documentation

## 2021-12-20 DIAGNOSIS — Z87891 Personal history of nicotine dependence: Secondary | ICD-10-CM | POA: Diagnosis not present

## 2021-12-20 DIAGNOSIS — I272 Pulmonary hypertension, unspecified: Secondary | ICD-10-CM

## 2021-12-20 DIAGNOSIS — Z86711 Personal history of pulmonary embolism: Secondary | ICD-10-CM | POA: Insufficient documentation

## 2021-12-20 DIAGNOSIS — Z7901 Long term (current) use of anticoagulants: Secondary | ICD-10-CM | POA: Insufficient documentation

## 2021-12-20 DIAGNOSIS — I480 Paroxysmal atrial fibrillation: Secondary | ICD-10-CM | POA: Diagnosis not present

## 2021-12-20 DIAGNOSIS — I2721 Secondary pulmonary arterial hypertension: Secondary | ICD-10-CM | POA: Insufficient documentation

## 2021-12-20 DIAGNOSIS — J439 Emphysema, unspecified: Secondary | ICD-10-CM | POA: Insufficient documentation

## 2021-12-20 MED ORDER — BUPROPION HCL ER (XL) 150 MG PO TB24: 150.0000 mg | ORAL_TABLET | Freq: Every day | ORAL | 0 refills | Status: DC

## 2021-12-20 NOTE — Telephone Encounter (Signed)
Patient Advocate Encounter   Received notification from [facility] that prior authorization for Tyvaso DPI is required.   PA submitted on CoverMyMeds Key [id number] Status is pending   Will continue to follow.  Audry Riles, PharmD, BCPS, BCCP, CPP Heart Failure Clinic Pharmacist [phone]

## 2021-12-20 NOTE — Progress Notes (Signed)
Height: 5'6"    Weight: 166 lbs BMI: 26.92  Today's Date: 12/20/21  STOP BANG RISK ASSESSMENT S (snore) Have you been told that you snore?     YES   T (tired) Are you often tired, fatigued, or sleepy during the day?   YES  O (obstruction) Do you stop breathing, choke, or gasp during sleep? NO   P (pressure) Do you have or are you being treated for high blood pressure? YES   B (BMI) Is your body index greater than 35 kg/m? NO   A (age) Are you 80 years old or older? YES   N (neck) Do you have a neck circumference greater than 16 inches?      G (gender) Are you a female? NO   TOTAL STOP/BANG YES ANSWERS 4                                                                       For Office Use Only              Procedure Order Form    YES to 3+ Stop Bang questions OR two clinical symptoms - patient qualifies for WatchPAT (CPT 95800)      Clinical Notes: Will consult Sleep Specialist and refer for management of therapy due to patient increased risk of Sleep Apnea. Ordering a sleep study due to the following two clinical symptoms: Excessive daytime sleepiness G47.10 / Loud snoring R06.83

## 2021-12-20 NOTE — Telephone Encounter (Signed)
Advanced Heart Failure Patient Advocate Encounter  Prior Authorization for Tyvaso DPI has been approved.    Effective dates: 12/11/2021 - 12/20/2022  Audry Riles, PharmD, BCPS, BCCP, CPP Heart Failure Clinic Pharmacist [phone]

## 2021-12-20 NOTE — Progress Notes (Signed)
PCP: Kathyrn Lass, MD Cardiology: Dr. Gardiner Rhyme HF Cardiology: Dr. Aundra Dubin  81 y.o. with history of PE, paroxysmal atrial fibrillation, ILD, COPD, and diastolic CHF with RV failure was referred by Dr. Gardiner Rhyme for evaluation of pulmonary hypertension and CHF. Patient had a submassive PE in 9/21 with saddle embolus.  V/Q scan in 9/22 showed no acute or chronic PE.  In 2/22, she was admitted with COVID-19 PNA.  This was complicated by atrial fibrillation with RVR.  Amiodarone was started but later stopped with rise in LFTs.   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  [place] in 7/22 showed elevated PCWP and moderate pulmonary HTN, primarily pulmonary venous hypertension.  She stopped smoking earlier this year.  Echo in 11/22 showed EF 60-65%, mildly decreased RV systolic function with mild RV enlargement, mild MR, moderate TR, PASP 85 mmHg, mild aortic stenosis and mild aortic insufficiency, IVC dilated.   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   Patient returns for followup of pulmonary hypertension.  Patient has been on home oxygen now for > 1 year.  She remains short of breath walking across her house.  Occasional lightheaded spells but no syncope. No chest pain.  She has a chronic cough.  She has generalized fatigue.  She quit smoking about 2 wks ago.   Labs (12/22): K 4, creatinine 1.03, BNP 242  ECG (personally reviewed): NSR, inferior T wave flattening   PMH: 1. Type 2 diabetes. 2. Submassive PE: Saddle embolus in 9/21.  3. Hyperlipidemia 4. Hypothyroidism 5. Atrial fibrillation: Paroxysmal.  Elevated LFTs with amiodarone.  6. COVID-19 PNA 2/22 7. ILD: UIP.  Followed by Dr. [name].  8. COPD/emphysema: Prior smoker, quit 2022.  - Home oxygen.  9. Pulmonary hypertension/RV failure: Echo (11/22) with EF 60-65%, mildly decreased RV systolic function with mild RV  enlargement, mild MR, moderate TR, PASP 85 mmHg, mild aortic stenosis and mild aortic insufficiency, IVC dilated.  - V/Q scan (9/22): Low probability for acute or chronic PE.  - CT chest (8/22): UIP and moderate emphysema.  - RHC (7/22): mean RA 8, PA 62/20 mean 37, mean PCWP 28, CI 2.15 thermo/1.65 Fick.  - LHC/RHC (12/22): nonobstructive CAD; mean RA 1, PA 73/25 mean 42, mean PCWP 14, CI 1.97, PVR 7.7 WU.   Social History   Socioeconomic History   Marital status: Widowed    Spouse name: Not on file   Number of children: Not on file   Years of education: Not on file   Highest education level: Not on file  Occupational History   Occupation: retired  Tobacco Use   Smoking status: Every Day    Packs/day: 1.00    Years: 60.00    Pack years: 60.00    Types: Cigarettes    Start date: 12/11/1958   Smokeless tobacco: Former    Quit date: 09/24/1962   Tobacco comments:    3cigs per day as of 12/15/21 ep  Vaping Use   Vaping Use: Never used  Substance and Sexual Activity   Alcohol use: Yes    Comment: drinks beer 12 every 2 weeks   Drug use: No   Sexual activity: Not on file  Other Topics Concern   Not on file  Social History Narrative   Not on file   Social Determinants of Health   Financial Resource Strain: Not on file  Food Insecurity: No Food Insecurity  Worried About Charity fundraiser in the Last Year: Never true   [place] in the Last Year: Never true  Transportation Needs: No Transportation Needs   Lack of Transportation (Medical): No   Lack of Transportation (Non-Medical): No  Physical Activity: Not on file  Stress: Not on file  Social Connections: Not on file  Intimate Partner Violence: Not on file   Family History  Problem Relation Age of Onset   Diabetes Mother    Kidney disease Mother    Hypertension Mother    Other Father        tuberculosis   Hypertension Sister    Hyperlipidemia Sister    ROS: All systems reviewed and negative except as per  HPI.   Current Outpatient Medications  Medication Sig Dispense Refill   acetaminophen (TYLENOL) 500 MG tablet Take 1,000 mg by mouth every 8 (eight) hours as needed for headache or mild pain (pain).     apixaban (ELIQUIS) 5 MG TABS tablet Take 1 tablet (5 mg total) by mouth 2 (two) times daily. 56 tablet 0   b complex vitamins tablet Take 1 tablet by mouth daily.     bisacodyl (DULCOLAX) 10 MG suppository Place 10 mg rectally as needed for moderate constipation.     Blood Glucose Monitoring Suppl (TRUE METRIX METER) w/Device KIT 1 each by Other route in the morning and at bedtime.     Calcium Carb-Cholecalciferol (CALCIUM + D3) 600-200 MG-UNIT TABS Take 2 tablets by mouth daily.     glucose blood test strip 1 each by Other route as needed for other (blood sugar).     Insulin Pen Needle (PEN NEEDLES 29GX1/2") 29G X 12MM MISC For insulin injection 50 each 0   levothyroxine (SYNTHROID, LEVOTHROID) 75 MCG tablet Take 75 mcg by mouth daily before breakfast.      lovastatin (MEVACOR) 40 MG tablet Take 40 mg by mouth at bedtime.     metoprolol succinate (TOPROL-XL) 25 MG 24 hr tablet Take 0.5 tablets (12.5 mg total) by mouth daily. 30 tablet 3   nicotine (NICODERM CQ - DOSED IN MG/24 HOURS) 14 mg/24hr patch Place 14 mg onto the skin daily.     Omega-3 Fatty Acids (FISH OIL) 1000 MG CAPS Take 1,000 mg by mouth daily.     OXYGEN Inhale 4 L into the lungs continuous.     potassium chloride (KLOR-CON) 10 MEQ tablet Take 2 tablets (20 mEq total) by mouth daily. 60 tablet 3   SMART SENSE THIN LANCETS 26G MISC 1 each by Does not apply route 2 (two) times daily.     Torsemide 40 MG TABS Take 2 tablets by mouth in the morning. 1 tablet in the PM     VITAMIN D PO Take 1,200 Units by mouth daily.     buPROPion (WELLBUTRIN XL) 150 MG 24 hr tablet Take 1 tablet (150 mg total) by mouth daily. 90 tablet 0   Current Facility-Administered Medications  Medication Dose Route Frequency Provider Last Rate Last Admin    sodium chloride flush (NS) 0.9 % injection 3 mL  3 mL Intravenous Q12H [name], MD       General: NAD Neck: No JVD, no thyromegaly or thyroid nodule.  Lungs: Occasional rhonchi CV: Nondisplaced PMI.  Heart regular S1/S2, no S3/S4, 1/6 SEM RUSB.  Trace ankle edema.  No carotid bruit.  Normal pedal pulses.  Abdomen: Soft, nontender, no hepatosplenomegaly, no distention.  Skin: Intact without lesions or rashes.  Neurologic: Alert and oriented x 3.  Psych: Normal affect. Extremities: No clubbing or cyanosis.  HEENT: Normal.   Assessment/Plan: 1. Atrial fibrillation: Paroxysmal.  She is in NSR today, no palpitations.  She had elevated LFTs with amiodarone in the past.  - Continue apixaban.  - Consider ablation with recurrence.  2. Pulmonary embolus: Submassive, 9/21.  No evidence for chronic PE on 9/22 V/Q scan.  - She is on apixaban.  3. COPD/emphysema: She quit smoking in in 12/22  She is on home oxygen.  4. Interstitial lung disease: UIP, followed by Dr. [name].  - She is considering anti-fibrotic treatment.  5. 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 The RV failure may be primarily due to pulmonary hypertension. RHC in 12/22 showed normal filling pressures with severe PH. On exam today, she looks euvolemic.  Suspect current dyspnea is due to lung disease.  - Continue torsemide 40 qam/20 qpm.   6. Pulmonary hypertension: Pulmonary venous hypertension on 7/22 RHC.  However, last echo in 11/22 showed PASP up to 85 mmHg. No evidence for chronic PE on 9/22 V/Q scan.  RHC in 12/22 showed normal filling pressures with severe pulmonary arterial hypertension.  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    - With PH-ILD, a trial of Tyvaso-DPI would  be reasonable.  I will arrange for this.  - She needs a sleep study, I will arrange.  7. Chest pain: Atypical but frequent.  She had significantly elevated troponin in 2/22 hospitalization.  Coronary angiography in 12/22 showed nonobstructive mild CAD.   Followup in 3 months.   Loralie Champagne 12/20/2021

## 2021-12-20 NOTE — Patient Instructions (Addendum)
Dr Aundra Dubin has recommend you start Tyvaso for your pulmonary hypertension, this is a specialized inhaled medication and has to come from a specialty pharmacy. We have started the paperwork to get this approved by your insurance company, this may take several weeks. The specialty pharmacy will call you before shipping the medication. IT IS VERY IMPORTANT YOU ANSWER PHONE CALLS FROM 800 #s  Your provider has recommended that you have a home sleep study.  We have provided you with the equipment in our office today. Please download the app and follow the instructions. WE WILL CALL YOU WHEN INSURANCE APPROVED TEST AND ADVISE YOU TO DO THE TEST. Once you have completed the test you just dispose of the equipment, the information is automatically uploaded to [place] via blue-tooth technology. If your test is positive for sleep apnea and you need a home CPAP machine you will be contacted by Dr Theodosia Blender office [facility]) to set this up.  Your physician recommends that you schedule a follow-up appointment in: 3 months  If you have any questions or concerns before your next appointment please send [place] a message through [place] or call our office at [phone].    TO LEAVE A MESSAGE FOR THE NURSE SELECT OPTION 2, PLEASE LEAVE A MESSAGE INCLUDING: YOUR NAME DATE OF BIRTH CALL BACK NUMBER REASON FOR CALL**this is important as we prioritize the call backs  YOU WILL RECEIVE A CALL BACK THE SAME DAY AS LONG AS YOU CALL BEFORE 4:00 PM  At the [facility], you and your health needs are our priority. As part of our continuing mission to provide you with exceptional heart care, we have created designated Provider Care Teams. These Care Teams include your primary Cardiologist (physician) and Advanced Practice Providers (APPs- Physician Assistants and Nurse Practitioners) who all work together to provide you with the care you need, when you need it.   You may see any of the following providers on your  designated Care Team at your next follow up: Dr Glori Bickers Dr Haynes Kerns, NP Lyda Jester, [place] [facility] [place], [place] Audry Riles, PharmD   Please be sure to bring in all your medications bottles to every appointment.

## 2021-12-22 NOTE — Telephone Encounter (Signed)
Sent in enrollment application to [place] for Tyvaso.    Application pending, will continue to follow.   Audry Riles, PharmD, BCPS, BCCP, CPP Heart Failure Clinic Pharmacist [phone]

## 2021-12-23 ENCOUNTER — Other Ambulatory Visit: Payer: No Typology Code available for payment source | Admitting: Family Medicine

## 2021-12-23 ENCOUNTER — Telehealth: Payer: Self-pay | Admitting: Internal Medicine

## 2021-12-23 ENCOUNTER — Other Ambulatory Visit: Payer: Self-pay

## 2021-12-23 ENCOUNTER — Telehealth: Payer: Self-pay | Admitting: Family Medicine

## 2021-12-23 ENCOUNTER — Encounter: Payer: Self-pay | Admitting: Family Medicine

## 2021-12-23 VITALS — BP 118/60 | HR 73 | Temp 97.7°F | Resp 22

## 2021-12-23 DIAGNOSIS — I272 Pulmonary hypertension, unspecified: Secondary | ICD-10-CM

## 2021-12-23 DIAGNOSIS — J849 Interstitial pulmonary disease, unspecified: Secondary | ICD-10-CM

## 2021-12-23 DIAGNOSIS — K5900 Constipation, unspecified: Secondary | ICD-10-CM

## 2021-12-23 DIAGNOSIS — E1165 Type 2 diabetes mellitus with hyperglycemia: Secondary | ICD-10-CM

## 2021-12-23 DIAGNOSIS — Z794 Long term (current) use of insulin: Secondary | ICD-10-CM

## 2021-12-23 DIAGNOSIS — J9621 Acute and chronic respiratory failure with hypoxia: Secondary | ICD-10-CM

## 2021-12-23 NOTE — Telephone Encounter (Signed)
Spoke with the pt's nurse Santiago Glad  She says pt's sats are 77-79% 4lpm at rest  Pt SOB at rest, but does not seem in distress  I advised to take her to ED  She was here a few wks ago and sats 93%4lpm at rest so this is a change  She verbalized understanding  Will forward to MR as Juluis Rainier

## 2021-12-23 NOTE — Telephone Encounter (Signed)
She gets volume overloaded easy. ER - yes

## 2021-12-23 NOTE — Progress Notes (Signed)
[facility] Palliative Care Consult Note Telephone: [phone]  Fax: [phone]   Date of encounter: 12/23/21 1:49 PM PATIENT NAME: Elizabeth Melton [place] [address]   [phone] (home)  DOB: October 16, 1941 MRN: [id number] PRIMARY CARE PROVIDER:    Kathyrn Lass, MD,  [place] [address] [phone]  REFERRING PROVIDER:   Kathyrn Melton, [place],  [place] [address] [phone]  RESPONSIBLE PARTY:    Contact Information     Name Relation Home Work [place] Daughter [phone]  [phone]        I met face to face with patient in her home. Palliative Care was asked to follow this patient by consultation request of  Elizabeth Lass, MD to address advance care planning and complex medical decision making. This is the initial visit.          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  Encouraged pt to increase to 5L [place] and go to ER for further evaluation per MD advice. Constipation-Add Miralax 17 gm daily Diabetes mellitus type 2 with hyperglycemia on long term insulin.  Continues on Lantus 10 units daily.  Educated to eat protein source and not just carbs. Advised to check blood sugars if feeling shaky/nauseous just to be sure she is not having hypoglycemic episodes.  Attempt to order fine gauge Lancets to improve compliance. Pulmonary Hypertension/Right sided heart failure-euvolemic at present.  Has Prior auth for Treprostinil and awaiting delivery from speciality pharnacy. Continue daily weights, fluid and salt restriction.   Follow up Palliative Care Visit: Palliative care will continue to follow for complex medical decision making, advance care planning,  and clarification of goals. Return 4 week or prn.  Will ask nurse to follow up beginning of next week by phone.    This visit was coded based on medical decision making (MDM).  PPS: 60%  HOSPICE ELIGIBILITY/DIAGNOSIS: TBD  Chief Complaint:  Elizabeth Melton received a referral for assisting the patient with chronic disease management, advance care planning and goals of care.  Dyspnea is current primary complaint.  HISTORY OF PRESENT ILLNESS:  Elizabeth Melton is a 81 y.o. 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  Appetite is fair due to inability to season, does add herbs to improve flavor of food but does little cooking due to dyspnea. Eats mostly prepared foods. Reports some constipation with straining to have a BM, occasionally uses suppository.  Has some tingling on urination, no dysuria. Continent of both bowel/bladder. Can dress and bathe independently but takes longer. Sleeps well.  Quit smoking 2 weeks ago, waiting on patches. Has rollator and portable O2 tanks/concentrator ([employer]).  When going out to MD appts she has to decrease her O2 to 3L to make her O2 last.  More comfortable laying down than sitting up.  Uses 4 L [place] and will decrease to 3 L.  Has cramping in left leg.  Has son and 2 daughters. Youngest daughter is her [facility] POA per pt. Rare fleeting chest pain on right side.  Has hx of PE. Takes Lantus 10 units in am but has not been checking her blood sugars stating the lancets provided "hurt" (28  gauge) and needles are "thick" where previously she could barely feel when she stuck her fingers.  Last HGB A1c 10/2021 was 7.6%.  Pt does drink juices.  She reports occasionally feeling shaky and nauseous, does not know whether eating or drinking changes this sensation.    History obtained from review of EMR, discussion with Elizabeth Melton.  I reviewed available labs, medications,  imaging, studies and related documents from the EMR.  Records reviewed and summarized above.   ROS General: NAD EYES: denies vision changes ENMT: denies dysphagia Cardiovascular: occasional fleeting right sided chest pain, resting SOB Pulmonary: denies worsened cough, has increased SOB Abdomen: endorses fair appetite and constipation, endorses continence of bowel GU: denies dysuria, endorses continence of urine MSK:  denies increased weakness, no falls reported Skin: denies rashes or wounds Neurological: denies pain, denies insomnia Psych: Endorses positive mood Heme/lymph/immuno: denies bruises, abnormal bleeding  Physical Exam: Current and past weights: 156.4 today (normal max is 160) Constitutional: NAD General: WNWD EYES: anicteric sclera, lids intact, no discharge  ENMT: intact hearing, oral mucous membranes moist, dentition intact CV: S1S2, RRR, no LE edema Pulmonary: Bibasilar coarse breath sounds without rales/rhonchi, mild increased work of breathing, no cough, O2 at 4L [place] Abdomen: normo-active BS + 4 quadrants, soft and non tender, no ascites GU: deferred MSK: no sarcopenia, moves all extremities, ambulatory Skin: warm and dry, no rashes or wounds on visible skin Neuro:  no generalized weakness, no cognitive impairment Psych: non-anxious affect, A and O x 3 Hem/lymph/immuno: no widespread bruising   CURRENT PROBLEM LIST:  Patient Active Problem List   Diagnosis Date Noted   Acute right-sided CHF (congestive heart failure) (HCC) 11/06/2021   Elevated troponin 11/03/2021   Constipation 11/03/2021   Acute on chronic respiratory failure with hypoxemia (HCC) 07/14/2021   Chronic diastolic heart failure ([place]) 07/05/2021   Pulmonary hypertension (HCC)    Acute on chronic systolic (congestive) heart failure ([place]) 05/19/2021   Acute on chronic respiratory failure with hypoxia (HCC) 05/19/2021   SOB (shortness of breath) 05/19/2021   AF (paroxysmal atrial fibrillation)  ([place]) 05/19/2021   Acute on chronic congestive heart failure (HCC)    Atrial fibrillation with RVR ([place])    Demand ischemia (HCC)    Pneumonia due to COVID-19 virus 01/16/2021   Acute respiratory failure with hypoxia ([place]) 09/06/2020   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[place]) 09/05/2020   ILD (interstitial lung disease) ([place]) 10/23/2019   Chronic respiratory failure with hypoxia ([place]) 10/23/2019   Essential (primary) hypertension 09/18/2018   PAD (peripheral artery disease) ([place]) 09/18/2018   Tobacco abuse 09/18/2018   Aortic regurgitation 09/18/2018   Hypothyroidism    Hyperlipidemia    Diabetes mellitus without complication ([place])    Lower extremity pain 09/24/2012   PAST MEDICAL HISTORY:  Active Ambulatory Problems    Diagnosis Date Noted   Lower extremity pain 09/24/2012   Hypothyroidism    Hyperlipidemia    Diabetes mellitus without complication ([place])    Essential (primary) hypertension 09/18/2018   PAD (peripheral artery disease) ([place]) 09/18/2018   Tobacco abuse 09/18/2018   Aortic regurgitation 09/18/2018   ILD (interstitial lung disease) ([place]) 10/23/2019   Chronic respiratory failure with hypoxia ([place]) 10/23/2019   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[place]) 09/05/2020   Acute respiratory failure with hypoxia ([place]) 09/06/2020   Pneumonia due to COVID-19 virus 01/16/2021   Atrial fibrillation with RVR (HCC)    Demand ischemia (HCC)    Acute on chronic systolic (congestive) heart failure ([place])  05/19/2021   Acute on chronic respiratory failure with hypoxia (HCC) 05/19/2021   SOB (shortness of breath) 05/19/2021   AF (paroxysmal atrial fibrillation) ([place]) 05/19/2021   Acute on chronic congestive heart failure (HCC)    Chronic diastolic heart failure ([place]) 07/05/2021   Pulmonary hypertension (HCC)    Acute on chronic respiratory failure with hypoxemia ([place]) 07/14/2021   Elevated troponin 11/03/2021   Constipation 11/03/2021   Acute right-sided CHF (congestive heart  failure) ([place]) 11/06/2021   Resolved Ambulatory Problems    Diagnosis Date Noted   No Resolved Ambulatory Problems   Past Medical History:  Diagnosis Date   Acute on chronic diastolic (congestive) heart failure ([place]) 05/19/2021   Hypertension    IPF (idiopathic pulmonary fibrosis) (HCC)    Thrombocytopenia (HCC)    SOCIAL HX:  Social History   Tobacco Use   Smoking status: Every Day    Packs/day: 1.00    Years: 60.00    Pack years: 60.00    Types: Cigarettes    Start date: 12/11/1958   Smokeless tobacco: Former    Quit date: 09/24/1962   Tobacco comments:    3cigs per day as of 12/15/21 ep  Substance Use Topics   Alcohol use: Yes    Comment: drinks beer 12 every 2 weeks   FAMILY HX:  Family History  Problem Relation Age of Onset   Diabetes Mother    Kidney disease Mother    Hypertension Mother    Other Father        tuberculosis   Hypertension Sister    Hyperlipidemia Sister        Preferred Pharmacy: ALLERGIES:  Allergies  Allergen Reactions   Lisinopril Other (See Comments)    Other reaction(s): cough 09/20/17     PERTINENT MEDICATIONS:  Outpatient Encounter Medications as of 12/23/2021  Medication Sig   acetaminophen (TYLENOL) 500 MG tablet Take 1,000 mg by mouth every 8 (eight) hours as needed for headache or mild pain (pain).   apixaban (ELIQUIS) 5 MG TABS tablet Take 1 tablet (5 mg total) by mouth 2 (two) times daily.   b complex vitamins tablet Take 1 tablet by mouth daily.   bisacodyl (DULCOLAX) 10 MG suppository Place 10 mg rectally as needed for moderate constipation.   Blood Glucose Monitoring Suppl (TRUE METRIX METER) w/Device KIT 1 each by Other route in the morning and at bedtime.   buPROPion (WELLBUTRIN XL) 150 MG 24 hr tablet Take 1 tablet (150 mg total) by mouth daily.   Calcium Carb-Cholecalciferol (CALCIUM + D3) 600-200 MG-UNIT TABS Take 2 tablets by mouth daily.   glucose blood test strip 1 each by Other route as needed for other (blood  sugar).   Insulin Pen Needle (PEN NEEDLES 29GX1/2") 29G X 12MM MISC For insulin injection   levothyroxine (SYNTHROID, LEVOTHROID) 75 MCG tablet Take 75 mcg by mouth daily before breakfast.    lovastatin (MEVACOR) 40 MG tablet Take 40 mg by mouth at bedtime.   metoprolol succinate (TOPROL-XL) 25 MG 24 hr tablet Take 0.5 tablets (12.5 mg total) by mouth daily.   nicotine (NICODERM CQ - DOSED IN MG/24 HOURS) 14 mg/24hr patch Place 14 mg onto the skin daily.   Omega-3 Fatty Acids (FISH OIL) 1000 MG CAPS Take 1,000 mg by mouth daily.   OXYGEN Inhale 4 L into the lungs continuous.   potassium chloride (KLOR-CON) 10 MEQ tablet Take 2 tablets (20 mEq total) by mouth daily.   SMART SENSE THIN LANCETS 26G MISC  1 each by Does not apply route 2 (two) times daily.   Torsemide 40 MG TABS Take 2 tablets by mouth in the morning. 1 tablet in the PM   VITAMIN D PO Take 1,200 Units by mouth daily.   Facility-Administered Encounter Medications as of 12/23/2021  Medication   sodium chloride flush (NS) 0.9 % injection 3 mL     -------------------------------------------------------------------------------------------------------------------------------------------------------------------------------------------------------------------------------- Advance Care Planning/Goals of Care: Goals include to maximize quality of life and symptom management. Patient gave her permission to discuss.Our advance care planning conversation included a discussion about:    The value and importance of advance care planning-has designated [facility] POA with youngest daughter Exploration of personal, cultural or spiritual beliefs that might influence medical decisions-advised of gradual progressive nature of illnesses Exploration of goals of care in the event of a sudden injury or illness-wants to think about options Identification  of a healthcare agent-youngest daughter Review of options available for MOST advance directive document  .  CODE STATUS: Full at present  I spent 12 minutes of this visit providing this Palliative Care counseling on goals of care.    Thank you for the opportunity to participate in the care of Ms. Johanning.  The palliative care team will continue to follow. Please call our office at [phone] if we can be of additional assistance.   Marijo Conception, FNP-C  COVID-19 PATIENT SCREENING TOOL Asked and negative response unless otherwise noted:  Have you had symptoms of covid, tested positive or been in contact with someone with symptoms/positive test in the past 5-10 days? No

## 2021-12-24 ENCOUNTER — Other Ambulatory Visit: Payer: Self-pay | Admitting: Family Medicine

## 2021-12-24 ENCOUNTER — Encounter: Payer: Self-pay | Admitting: Family Medicine

## 2021-12-26 ENCOUNTER — Telehealth (HOSPITAL_COMMUNITY): Payer: Self-pay | Admitting: Surgery

## 2021-12-26 NOTE — Telephone Encounter (Signed)
I called patient to remind her to the ordered home sleep study.  She tells me that plans to complete within the next few days.

## 2021-12-27 ENCOUNTER — Encounter (HOSPITAL_BASED_OUTPATIENT_CLINIC_OR_DEPARTMENT_OTHER): Payer: No Typology Code available for payment source | Admitting: Cardiology

## 2021-12-27 ENCOUNTER — Other Ambulatory Visit: Payer: Self-pay

## 2021-12-27 DIAGNOSIS — G4733 Obstructive sleep apnea (adult) (pediatric): Secondary | ICD-10-CM | POA: Diagnosis not present

## 2021-12-27 DIAGNOSIS — G4734 Idiopathic sleep related nonobstructive alveolar hypoventilation: Secondary | ICD-10-CM

## 2021-12-27 NOTE — Patient Outreach (Signed)
[place] [facility]) Care Management  12/27/2021  Elizabeth Melton 04/13/1941 [id number]   Telephone Assessment    Unsuccessful outreach attempt patient. No answer after multiple rings. Voicemail box not set up.    Plan: RN CM will make outreach attempt to patient within the month of March.  Enzo Montgomery, RN,BSN,CCM [place] Management Telephonic Care Management Coordinator Direct Phone: [phone] Toll Free: [phone] Fax: [phone]

## 2021-12-28 ENCOUNTER — Inpatient Hospital Stay (HOSPITAL_COMMUNITY)
Admission: EM | Admit: 2021-12-28 | Discharge: 2022-01-13 | DRG: 286 | Disposition: A | Discharge status: Skilled Nursing Facility | Payer: No Typology Code available for payment source | Attending: Internal Medicine | Admitting: Internal Medicine

## 2021-12-28 ENCOUNTER — Emergency Department (HOSPITAL_COMMUNITY): Payer: No Typology Code available for payment source

## 2021-12-28 ENCOUNTER — Other Ambulatory Visit: Payer: Self-pay

## 2021-12-28 ENCOUNTER — Encounter (HOSPITAL_COMMUNITY): Payer: Self-pay | Admitting: Emergency Medicine

## 2021-12-28 DIAGNOSIS — I2721 Secondary pulmonary arterial hypertension: Secondary | ICD-10-CM | POA: Diagnosis present

## 2021-12-28 DIAGNOSIS — E785 Hyperlipidemia, unspecified: Secondary | ICD-10-CM | POA: Diagnosis present

## 2021-12-28 DIAGNOSIS — E875 Hyperkalemia: Secondary | ICD-10-CM | POA: Diagnosis present

## 2021-12-28 DIAGNOSIS — R0602 Shortness of breath: Secondary | ICD-10-CM

## 2021-12-28 DIAGNOSIS — M545 Low back pain, unspecified: Secondary | ICD-10-CM | POA: Diagnosis present

## 2021-12-28 DIAGNOSIS — J84112 Idiopathic pulmonary fibrosis: Secondary | ICD-10-CM | POA: Diagnosis present

## 2021-12-28 DIAGNOSIS — R0902 Hypoxemia: Secondary | ICD-10-CM

## 2021-12-28 DIAGNOSIS — E1151 Type 2 diabetes mellitus with diabetic peripheral angiopathy without gangrene: Secondary | ICD-10-CM | POA: Diagnosis present

## 2021-12-28 DIAGNOSIS — F17210 Nicotine dependence, cigarettes, uncomplicated: Secondary | ICD-10-CM | POA: Diagnosis present

## 2021-12-28 DIAGNOSIS — Z794 Long term (current) use of insulin: Secondary | ICD-10-CM

## 2021-12-28 DIAGNOSIS — I5082 Biventricular heart failure: Secondary | ICD-10-CM | POA: Diagnosis present

## 2021-12-28 DIAGNOSIS — Z79890 Hormone replacement therapy (postmenopausal): Secondary | ICD-10-CM

## 2021-12-28 DIAGNOSIS — Z7951 Long term (current) use of inhaled steroids: Secondary | ICD-10-CM

## 2021-12-28 DIAGNOSIS — I739 Peripheral vascular disease, unspecified: Secondary | ICD-10-CM | POA: Diagnosis present

## 2021-12-28 DIAGNOSIS — I480 Paroxysmal atrial fibrillation: Secondary | ICD-10-CM | POA: Diagnosis present

## 2021-12-28 DIAGNOSIS — K5900 Constipation, unspecified: Secondary | ICD-10-CM | POA: Diagnosis present

## 2021-12-28 DIAGNOSIS — Z7901 Long term (current) use of anticoagulants: Secondary | ICD-10-CM

## 2021-12-28 DIAGNOSIS — E1122 Type 2 diabetes mellitus with diabetic chronic kidney disease: Secondary | ICD-10-CM | POA: Diagnosis present

## 2021-12-28 DIAGNOSIS — I2723 Pulmonary hypertension due to lung diseases and hypoxia: Secondary | ICD-10-CM | POA: Diagnosis present

## 2021-12-28 DIAGNOSIS — N1831 Chronic kidney disease, stage 3a: Secondary | ICD-10-CM | POA: Diagnosis present

## 2021-12-28 DIAGNOSIS — G8929 Other chronic pain: Secondary | ICD-10-CM | POA: Diagnosis present

## 2021-12-28 DIAGNOSIS — J9621 Acute and chronic respiratory failure with hypoxia: Secondary | ICD-10-CM | POA: Diagnosis present

## 2021-12-28 DIAGNOSIS — N189 Chronic kidney disease, unspecified: Secondary | ICD-10-CM

## 2021-12-28 DIAGNOSIS — Z86711 Personal history of pulmonary embolism: Secondary | ICD-10-CM

## 2021-12-28 DIAGNOSIS — J849 Interstitial pulmonary disease, unspecified: Secondary | ICD-10-CM | POA: Diagnosis present

## 2021-12-28 DIAGNOSIS — I5032 Chronic diastolic (congestive) heart failure: Secondary | ICD-10-CM | POA: Diagnosis present

## 2021-12-28 DIAGNOSIS — I509 Heart failure, unspecified: Secondary | ICD-10-CM

## 2021-12-28 DIAGNOSIS — N179 Acute kidney failure, unspecified: Secondary | ICD-10-CM | POA: Diagnosis present

## 2021-12-28 DIAGNOSIS — J439 Emphysema, unspecified: Secondary | ICD-10-CM

## 2021-12-28 DIAGNOSIS — G4733 Obstructive sleep apnea (adult) (pediatric): Secondary | ICD-10-CM | POA: Diagnosis present

## 2021-12-28 DIAGNOSIS — J449 Chronic obstructive pulmonary disease, unspecified: Secondary | ICD-10-CM

## 2021-12-28 DIAGNOSIS — I2510 Atherosclerotic heart disease of native coronary artery without angina pectoris: Secondary | ICD-10-CM | POA: Diagnosis present

## 2021-12-28 DIAGNOSIS — I5033 Acute on chronic diastolic (congestive) heart failure: Secondary | ICD-10-CM | POA: Diagnosis not present

## 2021-12-28 DIAGNOSIS — I10 Essential (primary) hypertension: Secondary | ICD-10-CM | POA: Diagnosis present

## 2021-12-28 DIAGNOSIS — E039 Hypothyroidism, unspecified: Secondary | ICD-10-CM | POA: Diagnosis present

## 2021-12-28 DIAGNOSIS — E1169 Type 2 diabetes mellitus with other specified complication: Secondary | ICD-10-CM | POA: Diagnosis present

## 2021-12-28 DIAGNOSIS — R0603 Acute respiratory distress: Secondary | ICD-10-CM

## 2021-12-28 DIAGNOSIS — I2781 Cor pulmonale (chronic): Secondary | ICD-10-CM | POA: Diagnosis present

## 2021-12-28 DIAGNOSIS — Z720 Tobacco use: Secondary | ICD-10-CM | POA: Diagnosis present

## 2021-12-28 DIAGNOSIS — I2729 Other secondary pulmonary hypertension: Secondary | ICD-10-CM | POA: Diagnosis present

## 2021-12-28 DIAGNOSIS — I130 Hypertensive heart and chronic kidney disease with heart failure and stage 1 through stage 4 chronic kidney disease, or unspecified chronic kidney disease: Principal | ICD-10-CM | POA: Diagnosis present

## 2021-12-28 DIAGNOSIS — R0600 Dyspnea, unspecified: Secondary | ICD-10-CM

## 2021-12-28 DIAGNOSIS — E1165 Type 2 diabetes mellitus with hyperglycemia: Secondary | ICD-10-CM | POA: Diagnosis present

## 2021-12-28 DIAGNOSIS — Z9114 Patient's other noncompliance with medication regimen: Secondary | ICD-10-CM

## 2021-12-28 DIAGNOSIS — M79606 Pain in leg, unspecified: Secondary | ICD-10-CM | POA: Diagnosis present

## 2021-12-28 DIAGNOSIS — J9600 Acute respiratory failure, unspecified whether with hypoxia or hypercapnia: Secondary | ICD-10-CM | POA: Diagnosis present

## 2021-12-28 DIAGNOSIS — Z833 Family history of diabetes mellitus: Secondary | ICD-10-CM

## 2021-12-28 DIAGNOSIS — Z9981 Dependence on supplemental oxygen: Secondary | ICD-10-CM

## 2021-12-28 DIAGNOSIS — Z8249 Family history of ischemic heart disease and other diseases of the circulatory system: Secondary | ICD-10-CM

## 2021-12-28 DIAGNOSIS — I272 Pulmonary hypertension, unspecified: Secondary | ICD-10-CM

## 2021-12-28 DIAGNOSIS — Z79899 Other long term (current) drug therapy: Secondary | ICD-10-CM

## 2021-12-28 DIAGNOSIS — Z8616 Personal history of COVID-19: Secondary | ICD-10-CM

## 2021-12-28 DIAGNOSIS — E871 Hypo-osmolality and hyponatremia: Secondary | ICD-10-CM | POA: Diagnosis present

## 2021-12-28 LAB — URINALYSIS, ROUTINE W REFLEX MICROSCOPIC
Bilirubin Urine: NEGATIVE
Glucose, UA: NEGATIVE mg/dL
Hgb urine dipstick: NEGATIVE
Ketones, ur: NEGATIVE mg/dL
Leukocytes,Ua: NEGATIVE
Nitrite: NEGATIVE
Protein, ur: NEGATIVE mg/dL
Specific Gravity, Urine: 1.01 (ref 1.005–1.030)
pH: 6 (ref 5.0–8.0)

## 2021-12-28 LAB — CBC WITH DIFFERENTIAL/PLATELET
Abs Immature Granulocytes: 0 10*3/uL (ref 0.00–0.07)
Basophils Absolute: 0.1 10*3/uL (ref 0.0–0.1)
Basophils Relative: 1 %
Eosinophils Absolute: 0 10*3/uL (ref 0.0–0.5)
Eosinophils Relative: 0 %
HCT: 46 % (ref 36.0–46.0)
Hemoglobin: 14.3 g/dL (ref 12.0–15.0)
Lymphocytes Relative: 18 %
Lymphs Abs: 1.6 10*3/uL (ref 0.7–4.0)
MCH: 33 pg (ref 26.0–34.0)
MCHC: 31.1 g/dL (ref 30.0–36.0)
MCV: 106.2 fL — ABNORMAL HIGH (ref 80.0–100.0)
Monocytes Absolute: 0.4 10*3/uL (ref 0.1–1.0)
Monocytes Relative: 5 %
Neutro Abs: 6.6 10*3/uL (ref 1.7–7.7)
Neutrophils Relative %: 76 %
Platelets: 160 10*3/uL (ref 150–400)
RBC: 4.33 MIL/uL (ref 3.87–5.11)
RDW: 18.4 % — ABNORMAL HIGH (ref 11.5–15.5)
WBC: 8.7 10*3/uL (ref 4.0–10.5)
nRBC: 3 % — ABNORMAL HIGH (ref 0.0–0.2)
nRBC: 4 /100 WBC — ABNORMAL HIGH

## 2021-12-28 LAB — COMPREHENSIVE METABOLIC PANEL
ALT: 21 U/L (ref 0–44)
AST: 38 U/L (ref 15–41)
Albumin: 3.3 g/dL — ABNORMAL LOW (ref 3.5–5.0)
Alkaline Phosphatase: 54 U/L (ref 38–126)
Anion gap: 11 (ref 5–15)
BUN: 28 mg/dL — ABNORMAL HIGH (ref 8–23)
CO2: 25 mmol/L (ref 22–32)
Calcium: 9.6 mg/dL (ref 8.9–10.3)
Chloride: 104 mmol/L (ref 98–111)
Creatinine, Ser: 1.26 mg/dL — ABNORMAL HIGH (ref 0.44–1.00)
GFR, Estimated: 43 mL/min — ABNORMAL LOW (ref >60)
Glucose, Bld: 129 mg/dL — ABNORMAL HIGH (ref 70–99)
Potassium: 5.2 mmol/L — ABNORMAL HIGH (ref 3.5–5.1)
Sodium: 140 mmol/L (ref 135–145)
Total Bilirubin: 1.4 mg/dL — ABNORMAL HIGH (ref 0.3–1.2)
Total Protein: 7.5 g/dL (ref 6.5–8.1)

## 2021-12-28 LAB — GLUCOSE, CAPILLARY: Glucose-Capillary: 290 mg/dL — ABNORMAL HIGH (ref 70–99)

## 2021-12-28 LAB — RESP PANEL BY RT-PCR (FLU A&B, COVID) ARPGX2
Influenza A by PCR: NEGATIVE
Influenza B by PCR: NEGATIVE
SARS Coronavirus 2 by RT PCR: NEGATIVE

## 2021-12-28 LAB — CBG MONITORING, ED: Glucose-Capillary: 187 mg/dL — ABNORMAL HIGH (ref 70–99)

## 2021-12-28 LAB — BRAIN NATRIURETIC PEPTIDE: B Natriuretic Peptide: 565.2 pg/mL — ABNORMAL HIGH (ref 0.0–100.0)

## 2021-12-28 MED ORDER — UMECLIDINIUM BROMIDE 62.5 MCG/ACT IN AEPB
1.0000 | INHALATION_SPRAY | Freq: Every day | RESPIRATORY_TRACT | Status: DC
  Administered 2021-12-28 – 2022-01-13 (×15): 1 via RESPIRATORY_TRACT
  Filled 2021-12-28 (×3): qty 7

## 2021-12-28 MED ORDER — INSULIN ASPART 100 UNIT/ML IJ SOLN
0.0000 [IU] | Freq: Three times a day (TID) | INTRAMUSCULAR | Status: DC
  Administered 2021-12-28: 3 [IU] via SUBCUTANEOUS
  Administered 2021-12-29: 11 [IU] via SUBCUTANEOUS
  Administered 2021-12-29 (×2): 5 [IU] via SUBCUTANEOUS
  Administered 2021-12-30: 2 [IU] via SUBCUTANEOUS
  Administered 2021-12-31 (×2): 3 [IU] via SUBCUTANEOUS
  Administered 2022-01-01: 2 [IU] via SUBCUTANEOUS
  Administered 2022-01-01: 3 [IU] via SUBCUTANEOUS
  Administered 2022-01-01 – 2022-01-02 (×2): 2 [IU] via SUBCUTANEOUS
  Administered 2022-01-03: 11:00:00 5 [IU] via SUBCUTANEOUS
  Administered 2022-01-03: 05:00:00 2 [IU] via SUBCUTANEOUS
  Administered 2022-01-04 (×2): 3 [IU] via SUBCUTANEOUS
  Administered 2022-01-05: 2 [IU] via SUBCUTANEOUS
  Administered 2022-01-05: 3 [IU] via SUBCUTANEOUS
  Administered 2022-01-05: 5 [IU] via SUBCUTANEOUS
  Administered 2022-01-06 (×2): 2 [IU] via SUBCUTANEOUS
  Administered 2022-01-06: 3 [IU] via SUBCUTANEOUS
  Administered 2022-01-07: 8 [IU] via SUBCUTANEOUS
  Administered 2022-01-07: 5 [IU] via SUBCUTANEOUS
  Administered 2022-01-08 – 2022-01-09 (×2): 3 [IU] via SUBCUTANEOUS
  Administered 2022-01-10: 5 [IU] via SUBCUTANEOUS
  Administered 2022-01-12: 2 [IU] via SUBCUTANEOUS

## 2021-12-28 MED ORDER — ALBUTEROL SULFATE (2.5 MG/3ML) 0.083% IN NEBU
5.0000 mg | INHALATION_SOLUTION | Freq: Once | RESPIRATORY_TRACT | Status: AC
  Administered 2021-12-28: 5 mg via RESPIRATORY_TRACT
  Filled 2021-12-28: qty 6

## 2021-12-28 MED ORDER — METOPROLOL SUCCINATE ER 25 MG PO TB24
12.5000 mg | ORAL_TABLET | Freq: Every day | ORAL | Status: DC
  Administered 2021-12-28 – 2022-01-13 (×17): 12.5 mg via ORAL
  Filled 2021-12-28 (×17): qty 1

## 2021-12-28 MED ORDER — PRAVASTATIN SODIUM 40 MG PO TABS
40.0000 mg | ORAL_TABLET | Freq: Every day | ORAL | Status: DC
  Administered 2021-12-28 – 2022-01-12 (×16): 40 mg via ORAL
  Filled 2021-12-28 (×16): qty 1

## 2021-12-28 MED ORDER — ARFORMOTEROL TARTRATE 15 MCG/2ML IN NEBU
15.0000 ug | INHALATION_SOLUTION | Freq: Two times a day (BID) | RESPIRATORY_TRACT | Status: DC
  Administered 2021-12-28 – 2022-01-13 (×29): 15 ug via RESPIRATORY_TRACT
  Filled 2021-12-28 (×33): qty 2

## 2021-12-28 MED ORDER — SODIUM CHLORIDE 0.9 % IV SOLN: 250.0000 mL | INTRAVENOUS | Status: DC | PRN

## 2021-12-28 MED ORDER — FUROSEMIDE 10 MG/ML IJ SOLN
40.0000 mg | Freq: Once | INTRAMUSCULAR | Status: AC
  Administered 2021-12-28: 40 mg via INTRAVENOUS
  Filled 2021-12-28: qty 4

## 2021-12-28 MED ORDER — NICOTINE 14 MG/24HR TD PT24
14.0000 mg | MEDICATED_PATCH | Freq: Every day | TRANSDERMAL | Status: DC
  Administered 2021-12-28 – 2022-01-13 (×17): 14 mg via TRANSDERMAL
  Filled 2021-12-28 (×17): qty 1

## 2021-12-28 MED ORDER — SODIUM CHLORIDE 0.9% FLUSH: 3.0000 mL | INTRAVENOUS | Status: DC | PRN

## 2021-12-28 MED ORDER — POTASSIUM CHLORIDE ER 10 MEQ PO TBCR
20.0000 meq | EXTENDED_RELEASE_TABLET | Freq: Every day | ORAL | Status: DC
  Filled 2021-12-28: qty 2

## 2021-12-28 MED ORDER — DEXAMETHASONE SODIUM PHOSPHATE 10 MG/ML IJ SOLN
10.0000 mg | Freq: Once | INTRAMUSCULAR | Status: AC
  Administered 2021-12-28: 10 mg via INTRAVENOUS
  Filled 2021-12-28: qty 1

## 2021-12-28 MED ORDER — ACETAMINOPHEN 325 MG PO TABS
650.0000 mg | ORAL_TABLET | Freq: Four times a day (QID) | ORAL | Status: DC | PRN
  Administered 2021-12-30 – 2022-01-09 (×10): 650 mg via ORAL
  Filled 2021-12-28 (×9): qty 2

## 2021-12-28 MED ORDER — BUPROPION HCL ER (XL) 150 MG PO TB24
150.0000 mg | ORAL_TABLET | Freq: Every day | ORAL | Status: DC
  Administered 2021-12-28 – 2022-01-13 (×17): 150 mg via ORAL
  Filled 2021-12-28 (×18): qty 1

## 2021-12-28 MED ORDER — INSULIN GLARGINE-YFGN 100 UNIT/ML ~~LOC~~ SOLN
10.0000 [IU] | Freq: Every day | SUBCUTANEOUS | Status: DC
  Administered 2021-12-28 – 2021-12-29 (×2): 10 [IU] via SUBCUTANEOUS
  Filled 2021-12-28 (×2): qty 0.1

## 2021-12-28 MED ORDER — ACETAMINOPHEN 650 MG RE SUPP: 650.0000 mg | Freq: Four times a day (QID) | RECTAL | Status: DC | PRN

## 2021-12-28 MED ORDER — FUROSEMIDE 10 MG/ML IJ SOLN
40.0000 mg | Freq: Every day | INTRAMUSCULAR | Status: DC
  Administered 2021-12-29: 40 mg via INTRAVENOUS
  Filled 2021-12-28: qty 4

## 2021-12-28 MED ORDER — APIXABAN 5 MG PO TABS
5.0000 mg | ORAL_TABLET | Freq: Two times a day (BID) | ORAL | Status: AC
  Administered 2021-12-28 – 2022-01-05 (×17): 5 mg via ORAL
  Filled 2021-12-28 (×18): qty 1

## 2021-12-28 MED ORDER — LEVOTHYROXINE SODIUM 75 MCG PO TABS
75.0000 ug | ORAL_TABLET | Freq: Every day | ORAL | Status: DC
  Administered 2021-12-29 – 2022-01-13 (×16): 75 ug via ORAL
  Filled 2021-12-28 (×16): qty 1

## 2021-12-28 MED ORDER — SODIUM CHLORIDE 0.9% FLUSH
3.0000 mL | Freq: Two times a day (BID) | INTRAVENOUS | Status: DC
  Administered 2021-12-28 – 2022-01-13 (×26): 3 mL via INTRAVENOUS

## 2021-12-28 NOTE — Assessment & Plan Note (Addendum)
Creatinine stable, continue current dose of torsemide

## 2021-12-28 NOTE — H&P (Signed)
History and Physical    Elizabeth Melton EQA:[id number] DOB: 27-Feb-1941 DOA: 12/28/2021  PCP: Kathyrn Lass, MD Consultants:  cardiology: dr. Gardiner Rhyme, HF: Dr. Aundra Dubin, pulmonology: Dr. [name] Patient coming from:  Home - lives with her older daughter   Chief Complaint: shortness of breath   HPI: Elizabeth Melton is a 81 y.o. 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 She woke up at 5:00am and had a hard time catching her breath. She couldn't even dress herself due to her shortness of breath. She called Ems to bring her to the hospital. She had her home oxygen on at 5L at this time.  Ems reported patient to be at 85% on her 5L. She typically is at 88-89%. She was placed on bipap and eventually weaned back to her home 5L. She has missed some of her "water pills". Has only been taking 1 in the AM and 1 at night instead of 2 in the AM. She has chronic leg swelling that is no worse than normal. No orthopnea. No weight gain, she states she has stayed around 164/165#.   She denies any fever/chills, headaches/vision changes, chest pain or palpitations, cough, stomach pain, N/V/D, she does have chronic constipation, no increased leg swelling, no dysuria but does have tingling in her vagina. No increased urgency or frequency.    ED Course: vitals: temp: 96.9, blood pressure: 147/66, HR: 71, RR: 18 oxygen: 96% on bipap Pertinent labs: Bnp: 565, Bun: 28, Creatinine: 1.26 In ED: given albuterol nebulizer, 60m dose of lasix, 125mof decadron. TRH was asked to admit.   Review of Systems: As per HPI; otherwise review of systems reviewed and negative.   Ambulatory Status:  Ambulates with walker and WC    Past Medical History:  Diagnosis Date   Acute on chronic  diastolic (congestive) heart failure ([facility]/08/2021   Acute respiratory failure with hypoxia (HCC) 09/06/2020   Atrial fibrillation with RVR (HCC)    Diabetes mellitus without complication (HCC)    Hyperlipidemia    Hypertension    Hypothyroidism    IPF (idiopathic pulmonary fibrosis) ([facility]   Pneumonia due to COVID-19 virus 01/16/2021   [facility]    Past Surgical History:  Procedure Laterality Date   RIGHT HEART CATH N/A 07/05/2021   Procedure: RIGHT HEART CATH;  Surgeon: HaLeonie ManMD;  [facility];  Service: Cardiovascular;  Laterality: N/A;   RIGHT/LEFT HEART CATH AND CORONARY ANGIOGRAPHY N/A 11/23/2021   Procedure: RIGHT/LEFT HEART CATH AND CORONARY ANGIOGRAPHY;  Surgeon: McLarey DresserMD;  [facility];  Service: Cardiovascular;  Laterality: N/A;   TUBAL LIGATION      Social History   Socioeconomic History   Marital status: Widowed    Spouse name: Not on file   Number of children: Not on file   Years of education: Not on file   Highest education level: Not on file  Occupational History   Occupation: retired  Tobacco Use   Smoking status: Every Day    Packs/day: 1.00    Years: 60.00    Pack years: 60.00    Types: Cigarettes    Start date: 12/11/1958   Smokeless tobacco: Former    Quit date: 09/24/1962   Tobacco comments:  3cigs per day as of 12/15/21 ep  Vaping Use   Vaping Use: Never used  Substance and Sexual Activity   Alcohol use: Yes    Comment: drinks beer 12 every 2 weeks   Drug use: No   Sexual activity: Not on file  Other Topics Concern   Not on file  Social History Narrative   Not on file   Social Determinants of Health   Financial Resource Strain: Not on file  Food Insecurity: No Food Insecurity   Worried About Running Out of Food in the Last Year: Never true   Ran Out of Food in the Last Year: Never true  Transportation Needs: No Transportation Needs   Lack of Transportation (Medical):  No   Lack of Transportation (Non-Medical): No  Physical Activity: Not on file  Stress: Not on file  Social Connections: Not on file  Intimate Partner Violence: Not on file    Allergies  Allergen Reactions   Lisinopril Other (See Comments)    Other reaction(s): cough 09/20/17    Family History  Problem Relation Age of Onset   Diabetes Mother    Kidney disease Mother    Hypertension Mother    Other Father        tuberculosis   Hypertension Sister    Hyperlipidemia Sister     Prior to Admission medications   Medication Sig Start Date End Date Taking? Authorizing Provider  acetaminophen (TYLENOL) 500 MG tablet Take 1,000 mg by mouth every 8 (eight) hours as needed for headache or mild pain (pain).    [provider]  apixaban (ELIQUIS) 5 MG TABS tablet Take 1 tablet (5 mg total) by mouth 2 (two) times daily. 11/24/21   Larey Dresser, MD  b complex vitamins tablet Take 1 tablet by mouth daily.    [provider]  bisacodyl (DULCOLAX) 10 MG suppository Place 10 mg rectally as needed for moderate constipation.    [provider]  Blood Glucose Monitoring Suppl (TRUE METRIX METER) w/Device KIT 1 each by Other route in the morning and at bedtime. 12/25/20   [provider]  buPROPion (WELLBUTRIN XL) 150 MG 24 hr tablet Take 1 tablet (150 mg total) by mouth daily. 12/20/21   Larey Dresser, MD  Calcium Carb-Cholecalciferol (CALCIUM + D3) 600-200 MG-UNIT TABS Take 2 tablets by mouth daily.    [provider]  glucose blood test strip 1 each by Other route as needed for other (blood sugar).    [provider]  insulin glargine (LANTUS) 100 UNIT/ML injection Inject 10 Units into the skin daily.    [provider]  Insulin Pen Needle (PEN NEEDLES 29GX1/2") 29G X 12MM MISC For insulin injection 05/22/21   [name], MD  levothyroxine (SYNTHROID, LEVOTHROID) 75 MCG tablet Take 75 mcg by mouth daily before breakfast.     [provider]  lovastatin (MEVACOR) 40 MG tablet Take 40 mg by mouth at bedtime.    [provider]  metoprolol succinate (TOPROL-XL) 25 MG 24 hr tablet Take 0.5 tablets (12.5 mg total) by mouth daily. 01/28/21   [name], MD  nicotine (NICODERM CQ - DOSED IN MG/24 HOURS) 14 mg/24hr patch Place 14 mg onto the skin daily.    [provider]  Omega-3 Fatty Acids (FISH OIL) 1000 MG CAPS Take 1,000 mg by mouth daily.    [provider]  OXYGEN Inhale 4 L into the lungs continuous.    [provider]  potassium chloride (KLOR-CON) 10 MEQ tablet Take 2 tablets (20 mEq total) by mouth daily. 11/18/21   Larey Dresser, MD  SMART SENSE THIN LANCETS 26G MISC 1 each by Does not apply route 2 (two) times daily.    [provider]  Torsemide 40 MG TABS Take 2 tablets by mouth in the morning. 1 tablet in the PM    [provider]  VITAMIN D PO Take 1,200 Units by mouth daily.    [provider]    Physical Exam: Vitals:   12/28/21 1600 12/28/21 1630 12/28/21 1730 12/28/21 1900  BP: (!) 152/96 (!) 142/92 (!) 147/68 (!) 146/63  Pulse: 98 77 76 79  Resp: (!) 27 (!) 29 (!) 24 (!) 21  Temp:      TempSrc:      SpO2: 94%  92% (!) 85%  Weight:      Height:         General:  Appears calm and comfortable and is in NAD Eyes:  PERRL, EOMI, normal lids, iris ENT:  grossly normal hearing, lips & tongue, mmm; appropriate dentition Neck:  no LAD, masses or thyromegaly; no carotid bruits, +JVP Cardiovascular:  RRR, no m/r/g. 1+ pitting edema to above the foot bilaterally  Respiratory:   no crackles or wheezes, rhonchi at bases.   Normal respiratory effort. Abdomen:  soft, NT, ND, NABS Back:   normal alignment, no CVAT Skin:  no rash or induration seen on limited exam Musculoskeletal:  grossly normal tone BUE/BLE, good ROM, no bony abnormality Lower extremity:  Limited foot exam with no ulcerations.  2+ distal pulses. Psychiatric:  grossly  normal mood and affect, speech fluent and appropriate, AOx3 Neurologic:  CN 2-12 grossly intact, moves all extremities in coordinated fashion, sensation intact    Radiological Exams on Admission: Independently reviewed - see discussion in A/P where applicable  DG Chest Port 1 View  Result Date: 12/28/2021 CLINICAL DATA:  Shortness of breath EXAM: PORTABLE CHEST 1 VIEW COMPARISON:  11/03/2021 FINDINGS: Unchanged cardiomegaly and aortic atherosclerosis. Redemonstrated extensive interstitial thickening, with possible additional airspace opacities in the lower lungs difficult to exclude. No definite pleural effusion. No acute osseous abnormality. IMPRESSION: Redemonstrated diffuse interstitial thickening, consistent with patient's known pulmonary fibrosis, with additional superimposed airspace opacity difficult to exclude given degree of fibrosis. Electronically Signed   By: Merilyn Baba M.D.   On: 12/28/2021 12:36    EKG: Independently reviewed.  NSR with rate 76; nonspecific ST changes with no evidence of acute ischemia. No significant changes, more artifact.    Labs on Admission: I have personally reviewed the available labs and imaging studies at the time of the admission.  Pertinent labs:  Bnp: 565 Bun: 28 Creatinine: 1.26 (1.17-1.30)    Assessment/Plan * 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 - (present on admission) -BNP up to 565 with acute on chronic respiratory failure in acute on chronic CHF with RV failure likely from non compliance with her home diuretic medication -has only been taking 14m Bid instead of 471min AM and 2065mn PM -has already put out over 1L with 34m93m lasix -will continue this for now and adjust as indicated -strict I/O and daily weights -pulmonary htn followed by dr. McLeAundra Dubincho: 10/2021-showed EF 60-65%, mildly decreased RV systolic function with mild RV enlargement, mild MR, moderate TR, PASP 85 mmHg, mild aortic  stenosis and mild aortic insufficiency, IVC dilated.   RHC/LHC 12/22-severe pulmonary HTN and  nonobstructive CAD  Acute on chronic respiratory failure with hypoxia ([place])- (present on admission) 81 year old female presenting with acute on chronic respiratory failure with hypoxia. Was satting down to 85% on 5L and required bipap for short time. Likely secondary to acute on chronic CHF with RV failure vs. 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 and she has shown signs of progression Considering anti-fibrotic therapy  Back to baseline of 5L [place]   Type 2 diabetes mellitus with hyperglycemia ([place])- (present on admission) a1c in 10/2021: 7.6 Continue lantus 10 units daily and moderate SSI accuchecks per protocol   AF (paroxysmal atrial fibrillation) ([place])- (present on admission) NSR Continue telemetry, toprol-xl and eliquis   Essential (primary) hypertension- (present on admission) Blood pressure mildly elevated Continue home medication: toprol-xl 12.31m daily   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   Level of care: Progressive DVT prophylaxis:  eliquis  Code Status:  Full - confirmed with patient Family Communication: None present; I spoke with the patient's daughter by telephone at the time of admission. SNeomia Dear: [phone] Plan:  The patient is from: home  Anticipated d/c is to: home    Patient placed in observation as anticipate less than 2 midnight stay. Requires hospitalization for IV medication, constant monitoring and assessment and is not safe to return home due to risk of worsening respiratory/cardiac function.    Patient is currently: stable  Consults called: none  Admission status:  observation    AOrma FlamingMD [facility]   How to contact the T[facility] LLCAttending or Consulting provider [place] covering provider during after hours [place] for this patient?  Check the care team in [facility]and look for a) attending/consulting [facility] provider listed and b) the [facility]team listed Log into [email or web address] and use [employer]'s universal password to access. If you do not have the password, please contact the hospital operator. Locate the TIsland Hospitalprovider you are looking for under [facility] and page to a number that you can be directly reached. If you still have difficulty reaching the provider, please page the [facility](Director on Call) for the Hospitalists listed on amion for assistance.   12/28/2021, 8:04 PM

## 2021-12-28 NOTE — Assessment & Plan Note (Deleted)
-  BNP up to 565 with acute on chronic respiratory failure in acute on chronic CHF with RV failure likely from non compliance with her home diuretic medication -has only been taking 54m Bid instead of 434min AM and 2037mn PM -has already put out over 1L with 49m91m lasix -will continue this for now and adjust as indicated -strict I/O and daily weights -pulmonary htn followed by dr. McLeAundra Dubincho: 10/2021-showed EF 60-65%, mildly decreased RV systolic function with mild RV enlargement, mild MR, moderate TR, PASP 85 mmHg, mild aortic stenosis and mild aortic insufficiency, IVC dilated.   RHC/LHC 12/22-severe pulmonary HTN and nonobstructive CAD

## 2021-12-28 NOTE — Assessment & Plan Note (Addendum)
Resolved

## 2021-12-28 NOTE — Assessment & Plan Note (Signed)
a1c in 10/2021: 7.6 Continue lantus 10 units daily and moderate SSI accuchecks per protocol

## 2021-12-28 NOTE — ED Provider Notes (Signed)
[place] EMERGENCY DEPARTMENT Provider Note   CSN: [id number] Arrival date & time: 12/28/21  1149     History  Chief Complaint  Patient presents with   Shortness of Breath    Elizabeth Melton is a 81 y.o. female.  HPI Patient presents via EMS in respiratory distress, with CPAP.  Patient cannot provide details, level 5 caveat.  Per EMS the patient has history of pulmonary fibrosis, heart failure, has been increasingly dyspneic in spite of home oxygen use.  EMS reports that with CPAP the patient combed somewhat, but had rhonchi bilaterally.  This improved somewhat as well with albuterol, but the patient continues to have abnormal breath sounds, increased work of breathing on ED arrival.  Additional details obtained on chart review, including pulmonary clinic visit last week with diagnosis of pulmonary fibrosis, congestive heart failure, ongoing medication management as well as palliative care consult.    Home Medications Prior to Admission medications   Medication Sig Start Date End Date Taking? Authorizing Provider  acetaminophen (TYLENOL) 500 MG tablet Take 1,000 mg by mouth every 8 (eight) hours as needed for headache or mild pain (pain).    [provider]  apixaban (ELIQUIS) 5 MG TABS tablet Take 1 tablet (5 mg total) by mouth 2 (two) times daily. 11/24/21   Larey Dresser, MD  b complex vitamins tablet Take 1 tablet by mouth daily.    [provider]  bisacodyl (DULCOLAX) 10 MG suppository Place 10 mg rectally as needed for moderate constipation.    [provider]  Blood Glucose Monitoring Suppl (TRUE METRIX METER) w/Device KIT 1 each by Other route in the morning and at bedtime. 12/25/20   [provider]  buPROPion (WELLBUTRIN XL) 150 MG 24 hr tablet Take 1 tablet (150 mg total) by mouth daily. 12/20/21   Larey Dresser, MD  Calcium Carb-Cholecalciferol (CALCIUM + D3) 600-200 MG-UNIT TABS Take 2 tablets by mouth daily.     [provider]  glucose blood test strip 1 each by Other route as needed for other (blood sugar).    [provider]  insulin glargine (LANTUS) 100 UNIT/ML injection Inject 10 Units into the skin daily.    [provider]  Insulin Pen Needle (PEN NEEDLES 29GX1/2") 29G X 12MM MISC For insulin injection 05/22/21   [name], MD  levothyroxine (SYNTHROID, LEVOTHROID) 75 MCG tablet Take 75 mcg by mouth daily before breakfast.     [provider]  lovastatin (MEVACOR) 40 MG tablet Take 40 mg by mouth at bedtime.    [provider]  metoprolol succinate (TOPROL-XL) 25 MG 24 hr tablet Take 0.5 tablets (12.5 mg total) by mouth daily. 01/28/21   [name], MD  nicotine (NICODERM CQ - DOSED IN MG/24 HOURS) 14 mg/24hr patch Place 14 mg onto the skin daily.    [provider]  Omega-3 Fatty Acids (FISH OIL) 1000 MG CAPS Take 1,000 mg by mouth daily.    [provider]  OXYGEN Inhale 4 L into the lungs continuous.    [provider]  potassium chloride (KLOR-CON) 10 MEQ tablet Take 2 tablets (20 mEq total) by mouth daily. 11/18/21   Larey Dresser, MD  SMART SENSE THIN LANCETS 26G MISC 1 each by Does not apply route 2 (two) times daily.    [provider]  Torsemide 40 MG TABS Take 2 tablets by mouth in the morning. 1 tablet in the PM    [provider]  VITAMIN D PO Take 1,200 Units by mouth daily.    [provider]      Allergies    Lisinopril    Review of Systems   Review of Systems  Unable to perform ROS: Dementia   Physical Exam Updated Vital Signs BP (!) 150/51    Pulse 71    Temp (!) 96.9 F (36.1 C) (Axillary)    Resp (!) 29    Ht _0  (1.676 m)    Wt 75.7 kg    SpO2 90%    BMI 26.92 kg/m  Physical Exam Vitals and nursing note reviewed.  Constitutional:      General: She is in acute distress.     Appearance: She is ill-appearing and diaphoretic.  HENT:     Head: Normocephalic and  atraumatic.  Eyes:     Conjunctiva/sclera: Conjunctivae normal.  Cardiovascular:     Rate and Rhythm: Regular rhythm. Tachycardia present.  Pulmonary:     Effort: Tachypnea, accessory muscle usage and respiratory distress present.     Breath sounds: Decreased breath sounds and rhonchi present.  Abdominal:     General: There is no distension.  Skin:    General: Skin is warm.  Neurological:     Mental Status: She is alert and oriented to person, place, and time.     Cranial Nerves: No cranial nerve deficit.  Psychiatric:        Mood and Affect: Mood is anxious.    ED Results / Procedures / Treatments   Labs (all labs ordered are listed, but only abnormal results are displayed) Labs Reviewed  CBC WITH DIFFERENTIAL/PLATELET - Abnormal; Notable for the following components:      Result Value   MCV 106.2 (*)    RDW 18.4 (*)    nRBC 3.0 (*)    nRBC 4 (*)    All other components within normal limits  BRAIN NATRIURETIC PEPTIDE - Abnormal; Notable for the following components:   B Natriuretic Peptide 565.2 (*)    All other components within normal limits  COMPREHENSIVE METABOLIC PANEL - Abnormal; Notable for the following components:   Potassium 5.2 (*)    Glucose, Bld 129 (*)    BUN 28 (*)    Creatinine, Ser 1.26 (*)    Albumin 3.3 (*)    Total Bilirubin 1.4 (*)    GFR, Estimated 43 (*)    All other components within normal limits  RESP PANEL BY RT-PCR (FLU A&B, COVID) [id number]    EKG EKG Interpretation  Date/Time:  Wednesday December 28 2021 11:58:08 EST Ventricular Rate:  76 PR Interval:  127 QRS Duration: 93 QT Interval:  391 QTC Calculation: 440 R Axis:   111 Text Interpretation: Sinus rhythm Atrial premature complexes Right axis deviation Low voltage, precordial leads Nonspecific repol abnormality, lateral leads Artifact T wave abnormality Abnormal ECG Confirmed by Carmin Muskrat [phone]) on 12/28/2021 2:17:37 PM  Radiology DG Chest Port 1 View  Result Date:  12/28/2021 CLINICAL DATA:  Shortness of breath EXAM: PORTABLE CHEST 1 VIEW COMPARISON:  11/03/2021 FINDINGS: Unchanged cardiomegaly and aortic atherosclerosis. Redemonstrated extensive interstitial thickening, with possible additional airspace opacities in the lower lungs difficult to exclude. No definite pleural effusion. No acute osseous abnormality. IMPRESSION: Redemonstrated diffuse interstitial thickening, consistent with patient's known pulmonary fibrosis, with additional superimposed airspace opacity difficult to exclude given degree of fibrosis. Electronically Signed   By: Merilyn Baba M.D.   On: 12/28/2021 12:36    Procedures  Procedures    Medications Ordered in ED Medications  dexamethasone (DECADRON) injection 10 mg (has no administration in time range)  albuterol (PROVENTIL) (2.5 MG/3ML) 0.083% nebulizer solution 5 mg (5 mg Nebulization Given 12/28/21 1201)  furosemide (LASIX) injection 40 mg (40 mg Intravenous Given 12/28/21 1203)    ED Course/ Medical Decision Making/ A&P   With respiratory distress on arrival the patient was placed on continuous cardiac monitoring, pulse oximetry. Pulse ox 95% with BiPAP, abnormal Cardiac 105 sinus tach abnormal Differential including infection, progression of disease, ACS all considered. 2:34 PM Patient on transitioning from BiPAP, is awake, alert, requires nasal cannula, but is speaking more clearly.  She has received IV Lasix in addition to bronchodilators, steroids, BiPAP as above.  Though she has improved, given concern for respiratory distress, and substantial comorbidities, patient will require admission for monitoring, management.                          Medical Decision Making Amount and/or Complexity of Data Reviewed Independent Historian: EMS External Data Reviewed: radiology and notes.    Details: Pulmonology and palliative care w worsening pulm fibrosis and CHF Labs: ordered. Decision-making details documented in ED  Course. Radiology: ordered and independent interpretation performed. Decision-making details documented in ED Course. ECG/medicine tests: independent interpretation performed.  Risk Prescription drug management. Drug therapy requiring intensive monitoring for toxicity. Decision regarding hospitalization.  Critical Care Total time providing critical care: 30-74 minutes (40)          Final Clinical Impression(s) / ED Diagnoses Final diagnoses:  Respiratory distress      Carmin Muskrat, MD 12/28/21 1444

## 2021-12-28 NOTE — ED Notes (Signed)
Pt taken off of bipap and placed on home 5L

## 2021-12-28 NOTE — Assessment & Plan Note (Deleted)
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

## 2021-12-28 NOTE — ED Triage Notes (Signed)
Pt arrives via EMS from home with SOB starting this morning. Pt reports missing some of her lasix. EMS reports pot was 85% on home 5L. Pt brought in on cpap and switched to bipap. Denies any pain.

## 2021-12-28 NOTE — Assessment & Plan Note (Signed)
Last TSH 05/2021 and wnl Continue home synthroid

## 2021-12-28 NOTE — Progress Notes (Signed)
Patient transported to 3E26 on the BiPAP w/o complications. Report given to unit RRT.

## 2021-12-28 NOTE — Assessment & Plan Note (Addendum)
Acute on chronic diastolic heart failure Severe pulmonary artery hypertension -Recent right heart cath on 11/23/2021 showed normal filling pressures, severe PAH and with low cardiac output.  Plan was to add Tyvaso as an outpatient. -Echo in 11/202 showed EF of 60 to 65% with moderate TR, PASP of 85 mmHg -Missed few doses of diuretics at home -Heart failure team following.  Right heart catheterization showed severe ongoing pulmonary hypertension.  -Diuresed with IV Lasix this admission, clinically euvolemic now, transitioned to oral torsemide, continue metoprolol -Oxygen needs continue to fluctuate, now down to 5 L since yesterday -Was seen by pulmonary again, restarted on short course of steroids and doxycycline, now off -Overall prognosis remains poor, palliative care team following, patient remains full code, requests full scope of treatment -Relatively stable, has been awaiting SNF, placement limited by O2 needs

## 2021-12-28 NOTE — Assessment & Plan Note (Signed)
Blood pressure mildly elevated Continue home medication: toprol-xl 12.66m daily

## 2021-12-28 NOTE — Assessment & Plan Note (Signed)
Continue nicotine patch

## 2021-12-28 NOTE — Assessment & Plan Note (Addendum)
-  Stable, continue toprol-xl and eliquis

## 2021-12-28 NOTE — Assessment & Plan Note (Signed)
Continue mevacor

## 2021-12-28 NOTE — Progress Notes (Signed)
Rec'd patient from ER currently on BIPAP tolerating well.

## 2021-12-29 ENCOUNTER — Ambulatory Visit: Payer: Medicare HMO | Admitting: Cardiology

## 2021-12-29 ENCOUNTER — Encounter (HOSPITAL_COMMUNITY): Payer: Self-pay | Admitting: Family Medicine

## 2021-12-29 DIAGNOSIS — N179 Acute kidney failure, unspecified: Secondary | ICD-10-CM | POA: Diagnosis present

## 2021-12-29 DIAGNOSIS — I959 Hypotension, unspecified: Secondary | ICD-10-CM | POA: Diagnosis not present

## 2021-12-29 DIAGNOSIS — J439 Emphysema, unspecified: Secondary | ICD-10-CM | POA: Diagnosis present

## 2021-12-29 DIAGNOSIS — J9621 Acute and chronic respiratory failure with hypoxia: Secondary | ICD-10-CM

## 2021-12-29 DIAGNOSIS — M5441 Lumbago with sciatica, right side: Secondary | ICD-10-CM | POA: Diagnosis not present

## 2021-12-29 DIAGNOSIS — F17210 Nicotine dependence, cigarettes, uncomplicated: Secondary | ICD-10-CM | POA: Diagnosis present

## 2021-12-29 DIAGNOSIS — J449 Chronic obstructive pulmonary disease, unspecified: Secondary | ICD-10-CM

## 2021-12-29 DIAGNOSIS — I10 Essential (primary) hypertension: Secondary | ICD-10-CM | POA: Diagnosis not present

## 2021-12-29 DIAGNOSIS — J9600 Acute respiratory failure, unspecified whether with hypoxia or hypercapnia: Secondary | ICD-10-CM | POA: Diagnosis present

## 2021-12-29 DIAGNOSIS — E871 Hypo-osmolality and hyponatremia: Secondary | ICD-10-CM | POA: Diagnosis present

## 2021-12-29 DIAGNOSIS — J441 Chronic obstructive pulmonary disease with (acute) exacerbation: Secondary | ICD-10-CM

## 2021-12-29 DIAGNOSIS — Z8616 Personal history of COVID-19: Secondary | ICD-10-CM | POA: Diagnosis not present

## 2021-12-29 DIAGNOSIS — I2721 Secondary pulmonary arterial hypertension: Secondary | ICD-10-CM | POA: Diagnosis present

## 2021-12-29 DIAGNOSIS — E875 Hyperkalemia: Secondary | ICD-10-CM | POA: Diagnosis present

## 2021-12-29 DIAGNOSIS — R531 Weakness: Secondary | ICD-10-CM | POA: Diagnosis not present

## 2021-12-29 DIAGNOSIS — I2729 Other secondary pulmonary hypertension: Secondary | ICD-10-CM | POA: Diagnosis present

## 2021-12-29 DIAGNOSIS — E1122 Type 2 diabetes mellitus with diabetic chronic kidney disease: Secondary | ICD-10-CM | POA: Diagnosis present

## 2021-12-29 DIAGNOSIS — N1831 Chronic kidney disease, stage 3a: Secondary | ICD-10-CM | POA: Diagnosis present

## 2021-12-29 DIAGNOSIS — J849 Interstitial pulmonary disease, unspecified: Secondary | ICD-10-CM | POA: Diagnosis not present

## 2021-12-29 DIAGNOSIS — E1165 Type 2 diabetes mellitus with hyperglycemia: Secondary | ICD-10-CM | POA: Diagnosis present

## 2021-12-29 DIAGNOSIS — J9601 Acute respiratory failure with hypoxia: Secondary | ICD-10-CM | POA: Diagnosis not present

## 2021-12-29 DIAGNOSIS — G4733 Obstructive sleep apnea (adult) (pediatric): Secondary | ICD-10-CM | POA: Diagnosis not present

## 2021-12-29 DIAGNOSIS — I272 Pulmonary hypertension, unspecified: Secondary | ICD-10-CM

## 2021-12-29 DIAGNOSIS — I5033 Acute on chronic diastolic (congestive) heart failure: Secondary | ICD-10-CM | POA: Diagnosis not present

## 2021-12-29 DIAGNOSIS — Z7189 Other specified counseling: Secondary | ICD-10-CM | POA: Diagnosis not present

## 2021-12-29 DIAGNOSIS — G8929 Other chronic pain: Secondary | ICD-10-CM | POA: Diagnosis present

## 2021-12-29 DIAGNOSIS — E1151 Type 2 diabetes mellitus with diabetic peripheral angiopathy without gangrene: Secondary | ICD-10-CM | POA: Diagnosis present

## 2021-12-29 DIAGNOSIS — Z86711 Personal history of pulmonary embolism: Secondary | ICD-10-CM | POA: Diagnosis not present

## 2021-12-29 DIAGNOSIS — Z7401 Bed confinement status: Secondary | ICD-10-CM | POA: Diagnosis not present

## 2021-12-29 DIAGNOSIS — Z9114 Patient's other noncompliance with medication regimen: Secondary | ICD-10-CM | POA: Diagnosis not present

## 2021-12-29 DIAGNOSIS — I480 Paroxysmal atrial fibrillation: Secondary | ICD-10-CM | POA: Diagnosis present

## 2021-12-29 DIAGNOSIS — E039 Hypothyroidism, unspecified: Secondary | ICD-10-CM | POA: Diagnosis present

## 2021-12-29 DIAGNOSIS — R0603 Acute respiratory distress: Secondary | ICD-10-CM | POA: Diagnosis present

## 2021-12-29 DIAGNOSIS — R0602 Shortness of breath: Secondary | ICD-10-CM | POA: Diagnosis not present

## 2021-12-29 DIAGNOSIS — I130 Hypertensive heart and chronic kidney disease with heart failure and stage 1 through stage 4 chronic kidney disease, or unspecified chronic kidney disease: Secondary | ICD-10-CM | POA: Diagnosis present

## 2021-12-29 DIAGNOSIS — I5082 Biventricular heart failure: Secondary | ICD-10-CM | POA: Diagnosis present

## 2021-12-29 DIAGNOSIS — J84112 Idiopathic pulmonary fibrosis: Secondary | ICD-10-CM

## 2021-12-29 DIAGNOSIS — E785 Hyperlipidemia, unspecified: Secondary | ICD-10-CM | POA: Diagnosis present

## 2021-12-29 DIAGNOSIS — I2510 Atherosclerotic heart disease of native coronary artery without angina pectoris: Secondary | ICD-10-CM | POA: Diagnosis present

## 2021-12-29 LAB — CBC
HCT: 41.9 % (ref 36.0–46.0)
Hemoglobin: 14 g/dL (ref 12.0–15.0)
MCH: 34.1 pg — ABNORMAL HIGH (ref 26.0–34.0)
MCHC: 33.4 g/dL (ref 30.0–36.0)
MCV: 102.2 fL — ABNORMAL HIGH (ref 80.0–100.0)
Platelets: 132 10*3/uL — ABNORMAL LOW (ref 150–400)
RBC: 4.1 MIL/uL (ref 3.87–5.11)
RDW: 17.9 % — ABNORMAL HIGH (ref 11.5–15.5)
WBC: 6.1 10*3/uL (ref 4.0–10.5)
nRBC: 4.4 % — ABNORMAL HIGH (ref 0.0–0.2)

## 2021-12-29 LAB — BASIC METABOLIC PANEL
Anion gap: 9 (ref 5–15)
BUN: 30 mg/dL — ABNORMAL HIGH (ref 8–23)
CO2: 21 mmol/L — ABNORMAL LOW (ref 22–32)
Calcium: 8.4 mg/dL — ABNORMAL LOW (ref 8.9–10.3)
Chloride: 107 mmol/L (ref 98–111)
Creatinine, Ser: 1.31 mg/dL — ABNORMAL HIGH (ref 0.44–1.00)
GFR, Estimated: 41 mL/min — ABNORMAL LOW (ref >60)
Glucose, Bld: 292 mg/dL — ABNORMAL HIGH (ref 70–99)
Potassium: 4.4 mmol/L (ref 3.5–5.1)
Sodium: 137 mmol/L (ref 135–145)

## 2021-12-29 LAB — GLUCOSE, CAPILLARY
Glucose-Capillary: 221 mg/dL — ABNORMAL HIGH (ref 70–99)
Glucose-Capillary: 306 mg/dL — ABNORMAL HIGH (ref 70–99)
Glucose-Capillary: 227 mg/dL — ABNORMAL HIGH (ref 70–99)
Glucose-Capillary: 312 mg/dL — ABNORMAL HIGH (ref 70–99)

## 2021-12-29 LAB — BRAIN NATRIURETIC PEPTIDE: B Natriuretic Peptide: 821 pg/mL — ABNORMAL HIGH (ref 0.0–100.0)

## 2021-12-29 LAB — PATHOLOGIST SMEAR REVIEW

## 2021-12-29 MED ORDER — FUROSEMIDE 10 MG/ML IJ SOLN: 40.0000 mg | Freq: Two times a day (BID) | INTRAMUSCULAR | Status: DC

## 2021-12-29 MED ORDER — IPRATROPIUM-ALBUTEROL 0.5-2.5 (3) MG/3ML IN SOLN
3.0000 mL | RESPIRATORY_TRACT | Status: DC
  Administered 2021-12-29 – 2021-12-30 (×4): 3 mL via RESPIRATORY_TRACT
  Filled 2021-12-29 (×5): qty 3

## 2021-12-29 MED ORDER — INSULIN GLARGINE-YFGN 100 UNIT/ML ~~LOC~~ SOLN
20.0000 [IU] | Freq: Every day | SUBCUTANEOUS | Status: DC
  Administered 2021-12-30 – 2022-01-07 (×9): 20 [IU] via SUBCUTANEOUS
  Filled 2021-12-29 (×10): qty 0.2

## 2021-12-29 MED ORDER — IPRATROPIUM-ALBUTEROL 0.5-2.5 (3) MG/3ML IN SOLN: 3.0000 mL | Freq: Three times a day (TID) | RESPIRATORY_TRACT | Status: DC

## 2021-12-29 MED ORDER — INSULIN ASPART 100 UNIT/ML IJ SOLN
3.0000 [IU] | Freq: Three times a day (TID) | INTRAMUSCULAR | Status: DC
  Administered 2021-12-29 – 2021-12-31 (×5): 3 [IU] via SUBCUTANEOUS

## 2021-12-29 MED ORDER — IPRATROPIUM-ALBUTEROL 0.5-2.5 (3) MG/3ML IN SOLN: 3.0000 mL | Freq: Four times a day (QID) | RESPIRATORY_TRACT | Status: DC

## 2021-12-29 MED ORDER — BUDESONIDE 0.25 MG/2ML IN SUSP
0.2500 mg | Freq: Two times a day (BID) | RESPIRATORY_TRACT | Status: DC
  Administered 2021-12-29 – 2022-01-13 (×27): 0.25 mg via RESPIRATORY_TRACT
  Filled 2021-12-29 (×31): qty 2

## 2021-12-29 MED ORDER — FUROSEMIDE 10 MG/ML IJ SOLN
60.0000 mg | Freq: Two times a day (BID) | INTRAMUSCULAR | Status: AC
  Administered 2021-12-29 – 2021-12-30 (×2): 60 mg via INTRAVENOUS
  Filled 2021-12-29 (×2): qty 6

## 2021-12-29 MED ORDER — TORSEMIDE 20 MG PO TABS: 40.0000 mg | ORAL_TABLET | Freq: Two times a day (BID) | ORAL | Status: DC

## 2021-12-29 MED ORDER — PHENAZOPYRIDINE HCL 100 MG PO TABS
100.0000 mg | ORAL_TABLET | Freq: Every day | ORAL | Status: AC
  Administered 2021-12-29 – 2021-12-30 (×2): 100 mg via ORAL
  Filled 2021-12-29 (×2): qty 1

## 2021-12-29 MED ORDER — METHYLPREDNISOLONE SODIUM SUCC 125 MG IJ SOLR
60.0000 mg | Freq: Two times a day (BID) | INTRAMUSCULAR | Status: DC
  Administered 2021-12-29: 60 mg via INTRAVENOUS
  Filled 2021-12-29: qty 2

## 2021-12-29 MED ORDER — GUAIFENESIN-DM 100-10 MG/5ML PO SYRP
5.0000 mL | ORAL_SOLUTION | ORAL | Status: DC | PRN
  Administered 2021-12-29: 5 mL via ORAL
  Filled 2021-12-29 (×2): qty 5

## 2021-12-29 NOTE — Progress Notes (Signed)
Heart Failure Navigator Progress Note  Assessed for Heart & Vascular TOC clinic readiness.  Patient does not meet criteria due to prior to hospitalization pt is established with AHF clinic (Dr. Aundra Dubin).   Navigator available for educational resources.  Pricilla Holm, MSN, RN Heart Failure Nurse Navigator [phone]

## 2021-12-29 NOTE — TOC Progression Note (Signed)
Transition of Care [facility]) - Progression Note    Patient Details  Name: Elizabeth Melton MRN: [id number] Date of Birth: 1941/02/17  Transition of Care [facility] CM/SW Contact  Zenon Mayo, RN Phone Number: 12/29/2021, 12:27 PM  Clinical Narrative:     from home, wears 5 liters oxygen at home, on bipap now, conts on iv lasix bid. TOC will continue to follow for dc needs.         Expected Discharge Plan and Services                                                 Social Determinants of Health (SDOH) Interventions    Readmission Risk Interventions Readmission Risk Prevention Plan 09/10/2020  Post Dischage Appt Complete  Medication Screening Complete  Transportation Screening Complete  Some recent data might be hidden

## 2021-12-29 NOTE — Progress Notes (Addendum)
Inpatient Diabetes Program Recommendations  AACE/ADA: New Consensus Statement on Inpatient Glycemic Control (2015)  Target Ranges:  Prepandial:   less than 140 mg/dL      Peak postprandial:   less than 180 mg/dL (1-2 hours)      Critically ill patients:  140 - 180 mg/dL   Lab Results  Component Value Date   GLUCAP 221 (H) 12/29/2021   HGBA1C 7.6 (H) 11/05/2021    Review of Glycemic Control  Latest Reference Range & Units 12/28/21 17:18 12/28/21 21:27 12/29/21 06:06  Glucose-Capillary 70 - 99 mg/dL 187 (H) 290 (H) 221 (H)   Diabetes history: DM 2 Outpatient Diabetes medications:  Lantus 10 units daily Current orders for Inpatient glycemic control:  Novolog moderate tid with meals Semglee 20 units daily Novolog 3 units tid with meals Solumedrol 60 mg IV q 12 hours Inpatient Diabetes Program Recommendations:    Agree with current orders- Will follow.   Thanks,  Adah Perl, RN, BC-ADM Inpatient Diabetes Coordinator Pager [phone]  (8a-5p)

## 2021-12-29 NOTE — Plan of Care (Signed)

## 2021-12-29 NOTE — Progress Notes (Signed)
Mobility Specialist Progress Note: ° ° 12/29/21 1400  °Mobility  °Activity Ambulated with assistance in hallway  °Level of Assistance Standby assist, set-up cues, supervision of patient - no hands on  °Assistive Device Four wheel walker  °Distance Ambulated (ft) 220 ft  °Activity Response Tolerated well  °$Mobility charge 1 Mobility  ° °Pre- Mobility: 92% SpO2 °During Mobility: HR;  BP;  90% SpO2 ° °Pt received in bed willing to participate in mobility. No complaints of pain. SOB when returning to bed. Pt left in bed with call bell in reach and all needs met.  ° °Elizabeth Melton °Mobility Specialist °Primary Phone [phone] °Secondary Phone [phone] ° ° °

## 2021-12-29 NOTE — Consult Note (Addendum)
NAME:  Elizabeth Melton, MRN:  [id number], DOB:  05/03/1941, LOS: 0 ADMISSION DATE:  12/28/2021, CONSULTATION DATE:  1/19 REFERRING MD:  Dr. Broadus Marlette Curvin, CHIEF COMPLAINT:  Acute respiratory failure w/ hypoxia  History of Present Illness:  Patient is a 81 year-old female with pertinent PMH chronic diastolic CHF (patient of Dr. Aundra Dubin), severe pulm hypertension, chronic respiratory failure, COPD, ILD (patient of Dr. [name]) on 5L O2 presents to [facility] on 1/18 with respiratory distress.  On 1/18 admitted to [facility] ED.  Sats initially 85% on 5L [place].  Her sats are typically 88 to 89%.  Patient placed on BiPAP.  States she missed some of her water pills.  Denies fever.  BNP 565.  CXR showing chronic lung injury.  Lasix given.  Albuterol neb given.  Initially no signs of eczema/ILD flare.  Admitted to [facility] on the floors.  On 1/19 patient continuing to have desaturation episodes and requiring BiPAP more often.  Patient now on salter HFNC 8L with sats 90%.  Questionable ILD is cause of respiratory distress.  PCCM asked for pulm consult.  Pertinent  Medical History   Past Medical History:  Diagnosis Date   Acute on chronic diastolic (congestive) heart failure ([place]) 05/19/2021   Acute respiratory failure with hypoxia (HCC) 09/06/2020   Atrial fibrillation with RVR (HCC)    Diabetes mellitus without complication (HCC)    Hyperlipidemia    Hypertension    Hypothyroidism    IPF (idiopathic pulmonary fibrosis) ([place])    Pneumonia due to COVID-19 virus 01/16/2021   Thrombocytopenia ([place])      Significant Hospital Events: Including procedures, antibiotic start and stop dates in addition to other pertinent events   1/18: admitted to [facility] 1/19: PCCM consulted for inability to wean from BiPAP  Interim History / Subjective:  On Bipap; unable to wean to [place] due to desaturation UOP last 24 hours -1.9 L  Objective   Blood pressure (!) 125/97, pulse 65, temperature 98.2 F (36.8 C), temperature source Oral, resp.  rate 19, height _0  (1.676 m), weight 75.1 kg, SpO2 90 %.    FiO2 (%):  [40 %] 40 %   Intake/Output Summary (Last 24 hours) at 12/29/2021 1243 Last data filed at 12/29/2021 0956 Gross per 24 hour  Intake 720 ml  Output 2550 ml  Net -1830 ml   Filed Weights   12/28/21 1207 12/28/21 2006 12/29/21 0513  Weight: 75.7 kg 75.5 kg 75.1 kg    Examination: General:  elderly appearing patient; NAD HEENT: MM pink/moist; salter [place] in place Neuro: Aox3; MAE CV: s1s2, no m/r/g PULM:  dim clear BS bilaterally; salter [place] 8 L; desats down to 85% when speaking and 92% at rest; bipap on standby GI: soft, bsx4 active  Extremities: warm/dry, no edema  Skin: no rashes or lesions appreciated  CXR 1/18: diffuse interstitial thickening  Resolved Hospital Problem list     Assessment & Plan:  Acute on chronic hypoxic respiratory failure 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 Severe PAH Possible OSA: sleep study 1/10: results pending P: -continue diuresis per HF team -consider starting Tyvaso -currently on 8 L salter [place] but requiring BiPAP more often today due to desaturation -continue salter [place]; if increasing sob or hypoxia consider using Heated HFNC -Sat goal 88-94% -bipap prn -may need cpap for home at discharge pending sleepy study results -trend cxr  COPD: on spiriva and albuterol prn ILD: on 5 L [place] @ home; patient of Ramaswamy P: -started on  steroids today -duoneb scheduled -continue brovana and incruse ellipta -Pulm toiletry: IS/flutter -HRCT planned for earlier this month; consider CT? -will need follow up outpatient to consider starting antifibrotics -consider pulmonary rehab outpatient   Best Practice (right click and "Reselect all SmartList Selections" daily)   Diet/type: per primary DVT prophylaxis: per primary GI prophylaxis: per primary Lines: NA Foley:  NA Code Status:  full code Last date of multidisciplinary goals of care discussion [per primary]  Labs    CBC: Recent Labs  Lab 12/28/21 1217 12/29/21 0331  WBC 8.7 6.1  NEUTROABS 6.6  --   HGB 14.3 14.0  HCT 46.0 41.9  MCV 106.2* 102.2*  PLT 160 132*    Basic Metabolic Panel: Recent Labs  Lab 12/28/21 1315 12/29/21 0331  NA 140 137  K 5.2* 4.4  CL 104 107  CO2 25 21*  GLUCOSE 129* 292*  BUN 28* 30*  CREATININE 1.26* 1.31*  CALCIUM 9.6 8.4*   GFR: Estimated Creatinine Clearance: 36.1 mL/min (A) (by C-G formula based on SCr of 1.31 mg/dL (H)). Recent Labs  Lab 12/28/21 1217 12/29/21 0331  WBC 8.7 6.1    Liver Function Tests: Recent Labs  Lab 12/28/21 1315  AST 38  ALT 21  ALKPHOS 54  BILITOT 1.4*  PROT 7.5  ALBUMIN 3.3*   No results for input(s): LIPASE, AMYLASE in the last 168 hours. No results for input(s): AMMONIA in the last 168 hours.  ABG    Component Value Date/Time   PHART 7.391 11/23/2021 1718   PCO2ART 40.7 11/23/2021 1718   PO2ART 62 (L) 11/23/2021 1718   HCO3 24.7 11/23/2021 1718   TCO2 26 11/23/2021 1718   ACIDBASEDEF 1.0 11/23/2021 1711   O2SAT 91.0 11/23/2021 1718     Coagulation Profile: No results for input(s): INR, PROTIME in the last 168 hours.  Cardiac Enzymes: No results for input(s): CKTOTAL, CKMB, CKMBINDEX, TROPONINI in the last 168 hours.  HbA1C: Hgb A1c MFr Bld  Date/Time Value Ref Range Status  11/05/2021 01:22 AM 7.6 (H) 4.8 - 5.6 % Final    Comment:    (NOTE)         Prediabetes: 5.7 - 6.4         Diabetes: >6.4         Glycemic control for adults with diabetes: <7.0   05/20/2021 04:55 AM 7.0 (H) 4.8 - 5.6 % Final    Comment:    (NOTE)         Prediabetes: 5.7 - 6.4         Diabetes: >6.4         Glycemic control for adults with diabetes: <7.0     CBG: Recent Labs  Lab 12/28/21 1718 12/28/21 2127 12/29/21 0606 12/29/21 1114  GLUCAP 187* 290* 221* 306*    Review of Systems:   Review of Systems  Constitutional:  Negative for fever.  HENT:  Negative for congestion.   Respiratory:  Positive for  cough and sputum production. Negative for shortness of breath and wheezing.   Cardiovascular:  Negative for chest pain.  Gastrointestinal:  Negative for diarrhea, nausea and vomiting.    Past Medical History:  She,  has a past medical history of Acute on chronic diastolic (congestive) heart failure ([place]) (05/19/2021), Acute respiratory failure with hypoxia ([place]) (09/06/2020), Atrial fibrillation with RVR ([place]), Diabetes mellitus without complication ([place]), Hyperlipidemia, Hypertension, Hypothyroidism, IPF (idiopathic pulmonary fibrosis) ([place]), Pneumonia due to COVID-19 virus (01/16/2021), and Thrombocytopenia ([place]).   Surgical History:  Past Surgical History:  Procedure Laterality Date   RIGHT HEART CATH N/A 07/05/2021   Procedure: RIGHT HEART CATH;  Surgeon: [name], MD;  Location: [place] CV LAB;  Service: Cardiovascular;  Laterality: N/A;   RIGHT/LEFT HEART CATH AND CORONARY ANGIOGRAPHY N/A 11/23/2021   Procedure: RIGHT/LEFT HEART CATH AND CORONARY ANGIOGRAPHY;  Surgeon: Larey Dresser, MD;  Location: [place] CV LAB;  Service: Cardiovascular;  Laterality: N/A;   TUBAL LIGATION       Social History:   reports that she has been smoking cigarettes. She started smoking about 63 years ago. She has a 60.00 pack-year smoking history. She quit smokeless tobacco use about 59 years ago. She reports current alcohol use. She reports that she does not use drugs.   Family History:  Her family history includes Diabetes in her mother; Hyperlipidemia in her sister; Hypertension in her mother and sister; Kidney disease in her mother; Other in her father.   Allergies Allergies  Allergen Reactions   Lisinopril Other (See Comments)    Other reaction(s): cough 09/20/17     Home Medications  Prior to Admission medications   Medication Sig Start Date End Date Taking? Authorizing Provider  acetaminophen (TYLENOL) 500 MG tablet Take 1,000 mg by mouth every 8 (eight) hours as needed for  headache or mild pain (pain).   Yes [provider]  apixaban (ELIQUIS) 5 MG TABS tablet Take 1 tablet (5 mg total) by mouth 2 (two) times daily. 11/24/21  Yes Larey Dresser, MD  b complex vitamins tablet Take 1 tablet by mouth daily.   Yes [provider]  bisacodyl (DULCOLAX) 10 MG suppository Place 10 mg rectally daily as needed for moderate constipation.   Yes [provider]  buPROPion (WELLBUTRIN XL) 150 MG 24 hr tablet Take 1 tablet (150 mg total) by mouth daily. 12/20/21  Yes Larey Dresser, MD  Calcium Carb-Cholecalciferol (CALCIUM + D3) 600-200 MG-UNIT TABS Take 2 tablets by mouth daily.   Yes [provider]  diphenhydrAMINE (BENADRYL) 25 MG tablet Take 25 mg by mouth every 6 (six) hours as needed for allergies.   Yes [provider]  insulin glargine (LANTUS) 100 UNIT/ML injection Inject 10 Units into the skin daily.   Yes [provider]  levothyroxine (SYNTHROID, LEVOTHROID) 75 MCG tablet Take 75 mcg by mouth daily before breakfast.    Yes [provider]  lovastatin (MEVACOR) 40 MG tablet Take 40 mg by mouth at bedtime.   Yes [provider]  metoprolol succinate (TOPROL-XL) 25 MG 24 hr tablet Take 0.5 tablets (12.5 mg total) by mouth daily. 01/28/21  Yes [name], MD  nicotine (NICODERM CQ - DOSED IN MG/24 HOURS) 14 mg/24hr patch Place 14 mg onto the skin daily.   Yes [provider]  Omega-3 Fatty Acids (FISH OIL) 1000 MG CAPS Take 1,000 mg by mouth daily.   Yes [provider]  OXYGEN Inhale 5 L into the lungs continuous.   Yes [provider]  potassium chloride (KLOR-CON) 10 MEQ tablet Take 2 tablets (20 mEq total) by mouth daily. 11/18/21  Yes Larey Dresser, MD  Tiotropium Bromide-Olodaterol (STIOLTO RESPIMAT) 2.5-2.5 MCG/ACT AERS Inhale 2 puffs into the lungs daily.   Yes [provider]  Torsemide 40 MG TABS Take 20-40 tablets by mouth See admin instructions.  Takes 40 mg in the morning and 20 mg at night   Yes [provider]  VITAMIN D PO Take 1,200 Units  by mouth daily.   Yes [provider]  Blood Glucose Monitoring Suppl (TRUE METRIX METER) w/Device KIT 1 each by Other route in the morning and at bedtime. 12/25/20   [provider]  glucose blood test strip 1 each by Other route as needed for other (blood sugar).    [provider]  Insulin Pen Needle (PEN NEEDLES 29GX1/2") 29G X 12MM MISC For insulin injection 05/22/21   [name], MD  SMART SENSE THIN LANCETS 26G MISC 1 each by Does not apply route 2 (two) times daily.    [provider]          JD Rexene Agent [facility] 12/29/2021, 12:43 PM  Please see [email or web address] for pager details.  From 7A-7P if no response, please call [phone]. After hours, please call ELink [phone].

## 2021-12-29 NOTE — Progress Notes (Signed)
Pt currently not requiring BIPAP and states she don't wear one at home.

## 2021-12-29 NOTE — Procedures (Signed)
° ° °  Sleep Study Report  Patient Information Study Date: 12/27/21 Patient Name: Elizabeth Melton Patient ID: [id number] Birth Date: 28-Mar-2041 Age: 81 Gender: Female Referring Provider:  Loralie Champagne, MD  TEST DESCRIPTION: Home sleep apnea testing was completed using the WatchPat, a Type 1 device, utilizing peripheral arterial tonometry (PAT), chest movement, actigraphy, pulse oximetry, pulse rate, body position and snore. AHI was calculated with apnea and hypopnea using valid sleep time as the denominator. RDI includes apneas, hypopneas, and RERAs. The data acquired and the scoring of sleep and all associated events were performed in accordance with the recommended standards and specifications as outlined in the AASM Manual for the Scoring of Sleep and Associated Events 2.2.0 (2015).  FINDINGS: 1. Moderate Obstructive Sleep Apnea with AHI 17.7/hr. 2. No Central Sleep Apnea with pAHIc 0.4/hr. 3. Oxygen desaturations as low as 52%. 4. Severe snoring was present. O2 sats were < 88% for 160.1 min. 5. Total sleep time was 2 hrs and 46 min. 6. 4.5 % of total sleep time was spent in REM sleep. 7. Normal sleep onset latency at 22 min 8. Prolonged REM sleep onset latency at 124 min. 9. Total awakenings were 33.  DIAGNOSIS: Moderate Obstructive Sleep Apnea (G47.33) Nocturnal Hypoxemia  RECOMMENDATIONS: 1. Clinical correlation of these findings is necessary. The decision to treat obstructive sleep apnea (OSA) is usually based on the presence of apnea symptoms or the presence of associated medical conditions such as Hypertension, Congestive Heart Failure, Atrial Fibrillation or Obesity. The most common symptoms of OSA are snoring, gasping for breath while sleeping, daytime sleepiness and fatigue.  2. Initiating apnea therapy is recommended given the presence of symptoms and/or associated conditions. Recommend proceeding with one of the following:   a. Auto-CPAP therapy with a pressure  range of 5-20cm H2O.   b. An oral appliance (OA) that can be obtained from certain dentists with expertise in sleep medicine. These are primarily of use in non-obese patients with mild and moderate disease.   c. An ENT consultation which may be useful to look for specific causes of obstruction and possible treatment options.   d. If patient is intolerant to PAP therapy, consider referral to ENT for evaluation for hypoglossal nerve stimulator.  3. Close follow-up is necessary to ensure success with CPAP or oral appliance therapy for maximum benefit .  4. A follow-up oximetry study on CPAP is recommended to assess the adequacy of therapy and determine the need for supplemental oxygen or the potential need for Bi-level therapy. An arterial blood gas to determine the adequacy of baseline ventilation and oxygenation should also be considered.  5. Healthy sleep recommendations include: adequate nightly sleep (normal 7-9 hrs/night), avoidance of caffeine after noon and alcohol near bedtime, and maintaining a sleep environment that is cool, dark and quiet.  6. Weight loss for overweight patients is recommended. Even modest amounts of weight loss can significantly improve the severity of sleep apnea.  7. Snoring recommendations include: weight loss where appropriate, side sleeping, and avoidance of alcohol before bed.  8. Operation of motor vehicle should not be performed when sleepy.  Signature: Electronically Signed: 12/29/21 Fransico Him, MD; [facility]; [place], American Board of Sleep Medicine

## 2021-12-29 NOTE — Consult Note (Addendum)
Advanced Heart Failure Team Consult Note   Primary Physician: Kathyrn Lass, MD PCP-Cardiologist:  [name], MD  Reason for Consultation: [facility]  HPI:    Elizabeth Melton is seen today for evaluation of PAH at the request of Dr Broadus John.   Elizabeth Melton is a 81 year old with a history of PE, paroxysmal atrial fibrillation, ILD, COPD, and diastolic CHF with RV failure, PAH, and CHF. Patient had a submassive PE in 9/21 with saddle embolus.  V/Q scan in 9/22 showed no acute or chronic PE.  In 2/22, she was admitted with COVID-19 PNA.  This was complicated by atrial fibrillation with RVR.  Amiodarone was started but later stopped with rise in LFTs.    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  [place] in 7/22 showed elevated PCWP and moderate pulmonary HTN, primarily pulmonary venous hypertension.  She stopped smoking earlier this year.  Echo in 11/22 showed EF 60-65%, mildly decreased RV systolic function with mild RV enlargement, mild Elizabeth, moderate TR, PASP 85 mmHg, mild aortic stenosis and mild aortic insufficiency, IVC dilated.    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   Saw Dr Aundra Dubin 12/20/21 for [place] follow up. She was on 5 liters [place] at home. Volume status was stable. Plans to start trial of Tyvaso-DPI. Applications for Tyvaso pending.   Presented to the ED via EMS increased shortness of breath and hypoxia.  She had missed some doses of her diuretic. She was taking torsemide 20 mg twice a day instead of torsemide 40 mg in am and 20 mg in pm.  Weight was unchanged at home. Placed on Bipap and weaned back to 5 liters [place]. CXR interstitial thickening. Pertinent labs included: BNP 565, BUN 28, creatinine 1.3, and SARS 2 negative. In ED given nebulizer, decadron, and IV lasix. She   This morning she was hypoxic and placed back on Bipap. Upon entering the room she was on  Bipap but wanted to resume 5 liters [place]. Sats immediately dropped to the 70s.   Review of Systems: [y] = yes, [ ] = no   General: Weight gain [ ]; Weight loss [ ]; Anorexia [ ]; Fatigue [ Y]; Fever [ ]; Chills [ ]; Weakness [ Y]  Cardiac: Chest pain/pressure [ ]; Resting SOB [ ]; Exertional SOB [ Y]; Orthopnea [ ]; Pedal Edema [ ]; Palpitations [ ]; Syncope [ ]; Presyncope [ ]; Paroxysmal nocturnal dyspnea[ ]  Pulmonary: Cough [ ]; Wheezing[ ]; Hemoptysis[ ]; Sputum [ ]; Snoring [ ]  GI: Vomiting[ ]; Dysphagia[ ]; Melena[ ]; Hematochezia [ ]; Heartburn[ ]; Abdominal pain [ ]; Constipation [ ]; Diarrhea [ ]; BRBPR [ ]  GU: Hematuria[ ]; Dysuria [ ]; Nocturia[ ]  Vascular: Pain in legs with walking [ ]; Pain in feet with lying flat [ ]; Non-healing sores [ ]; Stroke [ ]; TIA [ ]; Slurred speech [ ];  Neuro: Headaches[ ]; Vertigo[ ]; Seizures[ ]; Paresthesias[ ];Blurred vision [ ]; Diplopia [ ]; Vision changes [ ]  Ortho/Skin: Arthritis [ ]; Joint pain [ Y]; Muscle pain [ ]; Joint swelling [ ]; Back Pain [ ]; Rash [ ]  Psych: Depression[ ]; Anxiety[ ]  Heme: Bleeding problems [ ]; Clotting disorders [ ]; Anemia [ ]  Endocrine: Diabetes [ Y]; Thyroid dysfunction[ Y]  Home Medications Prior to Admission medications  Medication Sig Start Date End Date Taking? Authorizing Provider  acetaminophen (TYLENOL) 500 MG tablet Take 1,000 mg by mouth every 8 (eight) hours as needed for headache or mild pain (pain).   Yes [provider]  apixaban (ELIQUIS) 5 MG TABS tablet Take 1 tablet (5 mg total) by mouth 2 (two) times daily. 11/24/21  Yes Larey Dresser, MD  b complex vitamins tablet Take 1 tablet by mouth daily.   Yes [provider]  bisacodyl (DULCOLAX) 10 MG suppository Place 10 mg rectally daily as needed for moderate constipation.   Yes [provider]  buPROPion (WELLBUTRIN XL) 150 MG 24 hr tablet Take 1 tablet (150 mg total) by mouth daily. 12/20/21  Yes Larey Dresser, MD  Calcium Carb-Cholecalciferol (CALCIUM + D3) 600-200 MG-UNIT TABS Take 2 tablets by mouth daily.   Yes [provider]  diphenhydrAMINE (BENADRYL) 25 MG tablet Take 25 mg by mouth every 6 (six) hours as needed for allergies.   Yes [provider]  insulin glargine (LANTUS) 100 UNIT/ML injection Inject 10 Units into the skin daily.   Yes [provider]  levothyroxine (SYNTHROID, LEVOTHROID) 75 MCG tablet Take 75 mcg by mouth daily before breakfast.    Yes [provider]  lovastatin (MEVACOR) 40 MG tablet Take 40 mg by mouth at bedtime.   Yes [provider]  metoprolol succinate (TOPROL-XL) 25 MG 24 hr tablet Take 0.5 tablets (12.5 mg total) by mouth daily. 01/28/21  Yes [name], MD  nicotine (NICODERM CQ - DOSED IN MG/24 HOURS) 14 mg/24hr patch Place 14 mg onto the skin daily.   Yes [provider]  Omega-3 Fatty Acids (FISH OIL) 1000 MG CAPS Take 1,000 mg by mouth daily.   Yes [provider]  OXYGEN Inhale 5 L into the lungs continuous.   Yes [provider]  potassium chloride (KLOR-CON) 10 MEQ tablet Take 2 tablets (20 mEq total) by mouth daily. 11/18/21  Yes Larey Dresser, MD  Tiotropium Bromide-Olodaterol (STIOLTO RESPIMAT) 2.5-2.5 MCG/ACT AERS Inhale 2 puffs into the lungs daily.   Yes [provider]  Torsemide 40 MG TABS Take 20-40 tablets by mouth See admin instructions. Takes 40 mg in the morning and 20 mg at night   Yes [provider]  VITAMIN D PO Take 1,200 Units by mouth daily.   Yes [provider]  Blood Glucose Monitoring Suppl (TRUE METRIX METER) w/Device KIT 1 each by Other route in the morning and at bedtime. 12/25/20   [provider]  glucose blood test strip 1 each by Other route as needed for other (blood sugar).    [provider]  Insulin Pen Needle (PEN NEEDLES 29GX1/2") 29G X 12MM MISC For insulin injection 05/22/21   [name], MD  SMART  SENSE THIN LANCETS 26G MISC 1 each by Does not apply route 2 (two) times daily.    [provider]    Past Medical History: Past Medical History:  Diagnosis Date   Acute on chronic diastolic (congestive) heart failure ([place]) 05/19/2021   Acute respiratory failure with hypoxia (H[facility]) 09/06/2020   Atrial fibrillation with RVR (HCC)    Diabetes mellitus without complication (HCC)    Hyperlipidemia    Hypertension    Hypothyroidism    IPF (idiopathic pulmonary fibrosis) ([place])    Pneumonia due to COVID-19 virus 01/16/2021   Thrombocytopenia (HCC)     Past Surgical History: Past Surgical History:  Procedure Laterality Date  RIGHT HEART CATH N/A 07/05/2021   Procedure: RIGHT HEART CATH;  Surgeon: [name], MD;  Location: [facility];  Service: Cardiovascular;  Laterality: N/A;   RIGHT/LEFT HEART CATH AND CORONARY ANGIOGRAPHY N/A 11/23/2021   Procedure: RIGHT/LEFT HEART CATH AND CORONARY ANGIOGRAPHY;  Surgeon: Larey Dresser, MD;  Location: [facility];  Service: Cardiovascular;  Laterality: N/A;   TUBAL LIGATION      Family History: Family History  Problem Relation Age of Onset   Diabetes Mother    Kidney disease Mother    Hypertension Mother    Other Father        tuberculosis   Hypertension Sister    Hyperlipidemia Sister     Social History: Social History   Socioeconomic History   Marital status: Widowed    Spouse name: Not on file   Number of children: Not on file   Years of education: Not on file   Highest education level: Not on file  Occupational History   Occupation: retired  Tobacco Use   Smoking status: Every Day    Packs/day: 1.00    Years: 60.00    Pack years: 60.00    Types: Cigarettes    Start date: 12/11/1958   Smokeless tobacco: Former    Quit date: 09/24/1962   Tobacco comments:    3cigs per day as of 12/15/21 ep  Vaping Use   Vaping Use: Never used  Substance and Sexual Activity   Alcohol use: Yes    Comment: drinks  beer 12 every 2 weeks   Drug use: No   Sexual activity: Not on file  Other Topics Concern   Not on file  Social History Narrative   Not on file   Social Determinants of Health   Financial Resource Strain: Not on file  Food Insecurity: No Food Insecurity   Worried About [place] in the Last Year: Never true   [place] in the Last Year: Never true  Transportation Needs: No Transportation Needs   Lack of Transportation (Medical): No   Lack of Transportation (Non-Medical): No  Physical Activity: Not on file  Stress: Not on file  Social Connections: Not on file    Allergies:  Allergies  Allergen Reactions   Lisinopril Other (See Comments)    Other reaction(s): cough 09/20/17    Objective:    Vital Signs:   Temp:  [96.9 F (36.1 C)-98.4 F (36.9 C)] 98.2 F (36.8 C) (01/19 0736) Pulse Rate:  [60-98] 65 (01/19 0736) Resp:  [15-29] 20 (01/19 0736) BP: (114-154)/(47-96) 114/47 (01/19 0736) SpO2:  [82 %-100 %] 95 % (01/19 0919) FiO2 (%):  [40 %] 40 % (01/19 0919) Weight:  [75.1 kg-75.7 kg] 75.1 kg (01/19 0513) Last BM Date: 12/27/21  Weight change: Filed Weights   12/28/21 1207 12/28/21 2006 12/29/21 0513  Weight: 75.7 kg 75.5 kg 75.1 kg    Intake/Output:   Intake/Output Summary (Last 24 hours) at 12/29/2021 1056 Last data filed at 12/29/2021 0956 Gross per 24 hour  Intake 720 ml  Output 2550 ml  Net -1830 ml      Physical Exam    General:  Off Bipap--> dyspneic HEENT: normal Neck: supple. JVP 6-7  . Carotids 2+ bilat; no bruits. No lymphadenopathy or thyromegaly appreciated. Cor: PMI nondisplaced. Regular rate & rhythm. No rubs, gallops or murmurs. Lungs: Decreased in the bases placed back on Bipap.  Abdomen: soft, nontender, nondistended. No hepatosplenomegaly. No bruits or  masses. Good bowel sounds. Extremities: no cyanosis, clubbing, rash, edema Neuro: alert & orientedx3, cranial nerves grossly intact. moves all 4 extremities w/o  difficulty. Affect pleasant   Telemetry   SR 60-70s   EKG   SR 76 bpm personally reviewed   Labs   Basic Metabolic Panel: Recent Labs  Lab 12/28/21 1315 12/29/21 0331  NA 140 137  K 5.2* 4.4  CL 104 107  CO2 25 21*  GLUCOSE 129* 292*  BUN 28* 30*  CREATININE 1.26* 1.31*  CALCIUM 9.6 8.4*    Liver Function Tests: Recent Labs  Lab 12/28/21 1315  AST 38  ALT 21  ALKPHOS 54  BILITOT 1.4*  PROT 7.5  ALBUMIN 3.3*   No results for input(s): LIPASE, AMYLASE in the last 168 hours. No results for input(s): AMMONIA in the last 168 hours.  CBC: Recent Labs  Lab 12/28/21 1217 12/29/21 0331  WBC 8.7 6.1  NEUTROABS 6.6  --   HGB 14.3 14.0  HCT 46.0 41.9  MCV 106.2* 102.2*  PLT 160 132*    Cardiac Enzymes: No results for input(s): CKTOTAL, CKMB, CKMBINDEX, TROPONINI in the last 168 hours.  BNP: BNP (last 3 results) Recent Labs    11/07/21 0101 11/18/21 1610 12/28/21 1218  BNP 234.1* 241.9* 565.2*    ProBNP (last 3 results) No results for input(s): PROBNP in the last 8760 hours.   CBG: Recent Labs  Lab 12/28/21 1718 12/28/21 2127 12/29/21 0606  GLUCAP 187* 290* 221*    Coagulation Studies: No results for input(s): LABPROT, INR in the last 72 hours.   Imaging   DG Chest Port 1 View  Result Date: 12/28/2021 CLINICAL DATA:  Shortness of breath EXAM: PORTABLE CHEST 1 VIEW COMPARISON:  11/03/2021 FINDINGS: Unchanged cardiomegaly and aortic atherosclerosis. Redemonstrated extensive interstitial thickening, with possible additional airspace opacities in the lower lungs difficult to exclude. No definite pleural effusion. No acute osseous abnormality. IMPRESSION: Redemonstrated diffuse interstitial thickening, consistent with patient's known pulmonary fibrosis, with additional superimposed airspace opacity difficult to exclude given degree of fibrosis. Electronically Signed   By: Merilyn Baba M.D.   On: 12/28/2021 12:36     Medications:      Current Medications:  apixaban  5 mg Oral BID   arformoterol  15 mcg Nebulization BID   And   umeclidinium bromide  1 puff Inhalation Daily   buPROPion  150 mg Oral Daily   furosemide  40 mg Intravenous BID   insulin aspart  0-15 Units Subcutaneous TID WC   insulin aspart  3 Units Subcutaneous TID WC   [START ON 12/30/2021] insulin glargine-yfgn  20 Units Subcutaneous Daily   ipratropium-albuterol  3 mL Nebulization QID   levothyroxine  75 mcg Oral QAC breakfast   methylPREDNISolone (SOLU-MEDROL) injection  60 mg Intravenous Q12H   metoprolol succinate  12.5 mg Oral Daily   nicotine  14 mg Transdermal Daily   pravastatin  40 mg Oral q1800   sodium chloride flush  3 mL Intravenous Q12H    Infusions:  sodium chloride        Patient Profile  Elizabeth Elizabeth Melton is a 81 year old with a history of PE, PAF, ILD, COPD, and diastolic CHF with RV failure, and HFpEF.   Admitted with A/C respiratory failure + A/C HFpEF Assessment/Plan  A/C Hypoxic Respiratory Failure On 5 liters at home. Placed on Bipap in the ED and had weaned back to 5 liters. Today she is hypoxic and requiring Bipap. Off Bipap sats  drop to the 70s.  - Placed back on Bipap. RT called for HFNC - Volume status does not look elevated.   2. A/C HFpEF---> RV Failure Echo in 11/22 showed EF 60-65%, mildly decreased RV systolic function with mild RV enlargement, mild Elizabeth, moderate TR, PASP 85 mmHg, mild aortic stenosis and mild aortic insufficiency, IVC dilated. The RV failure may be primarily due to pulmonary hypertension. RHC in 12/22 showed normal filling pressures with severe PH. - Diuresed with IV lasix.  - On exam volume status does not appear elevated. Stop IV lasix and start torsemide 40 mg twice a day.  - Renal function stable.   3. PAH -Pulmonary venous hypertension on 7/22 RHC.  However, last echo in 11/22 showed PASP up to 85 mmHg. No evidence for chronic PE on 9/22 V/Q scan.  RHC in 12/22 showed normal filling  pressures with severe pulmonary arterial hypertension.  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    - With PH-ILD, a trial of Tyvaso-DPI planned. Tyvaso application pending. I have reached out to outpatient HF pharmacy team.   4. PAF  -Maintaining SR.  -On eliquis 5 mg twice a day.   5. ILD  6. DMII -On SSI  7. CKD Stage IIIa -Creatinine 1.3>1.3  -Daily BMET   8.   Hypothyroidism -On Levothyroxine.   9.Tobacco Abuse -Last smoked 2 weeks ago.   Disposition- Daughter lives with her. She has home oxygen   She would benefit from Palliative Care consult for [place] discussion.  Discussed with Dr Broadus John.   Length of Stay: 0  Darrick Grinder, NP  12/29/2021, 10:56 AM  Advanced Heart Failure Team Pager [phone] (M-F; 7a - 5p)  Please contact [facility] for night-coverage after hours (4p -7a ) and weekends on [email or web address]  Patient seen with NP, agree with the above note.   See extensive history above.  Patient was admitted with worsening dyspnea, she has missed doses of torsemide at home.   General: NAD Neck: JVP 10-11 cm, no thyromegaly or thyroid nodule.  Lungs: Dry crackles at bases.  CV: Nondisplaced PMI.  Heart regular S1/S2, no S3/S4, no murmur.  No peripheral edema.  No carotid bruit.  Normal pedal pulses.  Abdomen: Soft, nontender, no hepatosplenomegaly, no distention.  Skin: Intact without lesions or rashes.  Neurologic: Alert and oriented x 3.  Psych: Normal affect. Extremities: No clubbing or cyanosis.  HEENT: Normal.   Patient is maintaining NSR and is on apixaban, continue.   PE is unlikely, has not missed anticoagulation.   Seen by pulmonary.  Has severe baseline pulmonary fibrosis but doubt flare, they are stopping IV methylprednisolone.   She has missed po diuretic and still appears volume overloaded.  Would give 1 more doses of Lasix 60 mg IV bid then transition to torsemide 40 mg po bid.  Creatinine stable at 1.3.   Would like  to start Tyvaso DPI for PH-ILD.  This will be as outpatient.   Loralie Champagne 12/29/2021 3:13 PM

## 2021-12-29 NOTE — Progress Notes (Addendum)
PROGRESS NOTE    Elizabeth Melton  BVQ:[id number] DOB: August 02, 1941 DOA: 12/28/2021 PCP: Kathyrn Lass, MD  Brief Narrative: 80/F with history of chronic respiratory failure, COPD and interstitial lung disease on 5 L O2, severe pulmonary hypertension, chronic diastolic CHF, RV failure, type 2 diabetes mellitus, hypertension, paroxysmal atrial fibrillation, history of pulmonary embolism on Eliquis, PAD presented to the ED with acute worsening shortness of breath since yesterday.  When EMS arrived her O2 sats were 85% on 5 L, she was placed on BiPAP and transferred to the ED, reported missing some of her diuretic doses in the last few days. In the ED placed on BiPAP for respiratory distress, chest x-ray noted chronic findings, labs were unremarkable   Subjective: -Short of breath this morning, required BiPAP  Assessment & Plan:  Acute on chronic hypoxic respiratory failure 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 Severe PAH -Recent right heart cath 12/14 noted normal filling pressures, severe PAH and with low cardiac output, plan was to start Tyvaso -Patient admits to missing/taking less diuretics recently than usual -Echo: 10/2021-showed EF 60-65%, mildly decreased RV systolic function with mild RV enlargement, mild MR, moderate TR, PASP 85 mmHg -Continue IV Lasix today -Volume status difficult to assess, will request cardiology input -Faint expiratory wheezes noted as well, will add IV steroids today, may need pulmonary input as well depending on hospital course, cannot rule out progression of ILD, although typically would not expect acute worsening in that case -Add BiPAP nightly, would definitely benefit from BiPAP at discharge for chronic respiratory failure -May benefit from palliative care eval as well  CKD (chronic kidney disease) stage IIIa -Stable, monitor with diuresis  Hyperkalemia- (present on admission) Hold oral potassium replacement today    AF (paroxysmal atrial fibrillation) ([place])- (present on admission) -In sinus rhythm, continue toprol-xl and eliquis   COPD ILD (interstitial lung disease) ([place])- (present on admission) Followed by pulmonology and recently seen this month 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  Considering anti-fibrotic therapy  -Wean O2 as tolerated  Tobacco abuse- (present on admission) Continue nicotine patch   Essential (primary) hypertension- (present on admission) Blood pressure mildly elevated Continue home medication: toprol-xl 12.61m daily   Type 2 diabetes mellitus with hyperglycemia ([place]- (present on admission) a1c in 10/2021: 7.6 -Continue Lantus, will increase dose  Hyperlipidemia/PAD- (present on admission) Continue mevacor  Hypothyroidism- (present on admission) Last TSH 05/2021 and wnl Continue home synthroid     DVT prophylaxis: Eliquis Code Status: Full code Family Communication: Discussed with patient in detail, no family at bedside Disposition Plan: Home pending improvement in respiratory status Observation, needs to be inpatient due to respiratory failure requiring diuretics, BiPAP etc.  Consultants:  Cardiology pending  Procedures:   Antimicrobials:    Objective: Vitals:   12/29/21 0015 12/29/21 0513 12/29/21 0736 12/29/21 0919  BP: 139/69 120/69 (!) 114/47   Pulse: 72 60 65   Resp: _0 Temp: 98.2 F (36.8 C) (!) 97.3 F (36.3 C) 98.2 F (36.8 C)   TempSrc: Oral Oral Oral   SpO2: 92% (!) 82% (!) 85% 95%  Weight:  75.1 kg    Height:        Intake/Output Summary (Last 24 hours) at 12/29/2021 1015 Last data filed at 12/29/2021 0956 Gross per 24 hour  Intake 720 ml  Output 2550 ml  Net -1830 ml   Filed Weights   12/28/21 1207 12/28/21 2006 12/29/21 0513  Weight: 75.7 kg 75.5  kg 75.1 kg    Examination:  General exam: Chronically ill female sitting up in bed, AAOx3, on BiPAP HEENT: No JVD CVS: S1-S2, regular rate rhythm Lungs: Few  scattered rales, occasional expiratory wheezes Abdomen: Soft, nontender, bowel sounds present Extremities: No edema Skin: No rashes Psychiatry: Judgement and insight appear normal. Mood & affect appropriate.     Data Reviewed:   CBC: Recent Labs  Lab 12/28/21 1217 12/29/21 0331  WBC 8.7 6.1  NEUTROABS 6.6  --   HGB 14.3 14.0  HCT 46.0 41.9  MCV 106.2* 102.2*  PLT 160 812*   Basic Metabolic Panel: Recent Labs  Lab 12/28/21 1315 12/29/21 0331  NA 140 137  K 5.2* 4.4  CL 104 107  CO2 25 21*  GLUCOSE 129* 292*  BUN 28* 30*  CREATININE 1.26* 1.31*  CALCIUM 9.6 8.4*   GFR: Estimated Creatinine Clearance: 36.1 mL/min (A) (by C-G formula based on SCr of 1.31 mg/dL (H)). Liver Function Tests: Recent Labs  Lab 12/28/21 1315  AST 38  ALT 21  ALKPHOS 54  BILITOT 1.4*  PROT 7.5  ALBUMIN 3.3*   No results for input(s): LIPASE, AMYLASE in the last 168 hours. No results for input(s): AMMONIA in the last 168 hours. Coagulation Profile: No results for input(s): INR, PROTIME in the last 168 hours. Cardiac Enzymes: No results for input(s): CKTOTAL, CKMB, CKMBINDEX, TROPONINI in the last 168 hours. BNP (last 3 results) No results for input(s): PROBNP in the last 8760 hours. HbA1C: No results for input(s): HGBA1C in the last 72 hours. CBG: Recent Labs  Lab 12/28/21 1718 12/28/21 2127 12/29/21 0606  GLUCAP 187* 290* 221*   Lipid Profile: No results for input(s): CHOL, HDL, LDLCALC, TRIG, CHOLHDL, LDLDIRECT in the last 72 hours. Thyroid Function Tests: No results for input(s): TSH, T4TOTAL, FREET4, T3FREE, THYROIDAB in the last 72 hours. Anemia Panel: No results for input(s): VITAMINB12, FOLATE, FERRITIN, TIBC, IRON, RETICCTPCT in the last 72 hours. Urine analysis:    Component Value Date/Time   COLORURINE YELLOW 12/28/2021 1510   APPEARANCEUR CLEAR 12/28/2021 1510   APPEARANCEUR Clear 09/20/2021 1449   LABSPEC 1.010 12/28/2021 1510   PHURINE 6.0 12/28/2021  1510   GLUCOSEU NEGATIVE 12/28/2021 1510   HGBUR NEGATIVE 12/28/2021 [place] 12/28/2021 1510   BILIRUBINUR Negative 09/20/2021 [place] 12/28/2021 1510   PROTEINUR NEGATIVE 12/28/2021 1510   NITRITE NEGATIVE 12/28/2021 1510   LEUKOCYTESUR NEGATIVE 12/28/2021 1510   Sepsis Labs: _0 (procalcitonin:4,lacticidven:4)  ) Recent Results (from the past 240 hour(s))  Resp Panel by RT-PCR (Flu A&B, Covid) Nasopharyngeal Swab     Status: None   Collection Time: 12/28/21  3:50 PM   Specimen: Nasopharyngeal Swab; Nasopharyngeal(NP) swabs in vial transport medium  Result Value Ref Range Status   SARS Coronavirus 2 by RT PCR NEGATIVE NEGATIVE Final    Comment: (NOTE) SARS-CoV-2 target nucleic acids are NOT DETECTED.  The SARS-CoV-2 RNA is generally detectable in upper respiratory specimens during the acute phase of infection. The lowest concentration of SARS-CoV-2 viral copies this assay can detect is 138 copies/mL. A negative result does not preclude SARS-Cov-2 infection and should not be used as the sole basis for treatment or other patient management decisions. A negative result may occur with  improper specimen collection/handling, submission of specimen other than nasopharyngeal swab, presence of viral mutation(s) within the areas targeted by this assay, and inadequate number of viral copies(<138 copies/mL). A negative result must be combined with clinical  observations, patient history, and epidemiological information. The expected result is Negative.  Fact Sheet for Patients:  EntrepreneurPulse.com.au  Fact Sheet for Healthcare Providers:  IncredibleEmployment.be  This test is no t yet approved or cleared by the [place] FDA and  has been authorized for detection and/or diagnosis of SARS-CoV-2 by FDA under an Emergency Use Authorization (EUA). This EUA will remain  in effect (meaning this test can  be used) for the duration of the COVID-19 declaration under Section 564(b)(1) of the Act, 21 U.S.C.section 360bbb-3(b)(1), unless the authorization is terminated  or revoked sooner.       Influenza A by PCR NEGATIVE NEGATIVE Final   Influenza B by PCR NEGATIVE NEGATIVE Final    Comment: (NOTE) The Xpert Xpress SARS-CoV-2/FLU/RSV plus assay is intended as an aid in the diagnosis of influenza from Nasopharyngeal swab specimens and should not be used as a sole basis for treatment. Nasal washings and aspirates are unacceptable for Xpert Xpress SARS-CoV-2/FLU/RSV testing.  Fact Sheet for Patients: EntrepreneurPulse.com.au  Fact Sheet for Healthcare Providers: IncredibleEmployment.be  This test is not yet approved or cleared by the [place] FDA and has been authorized for detection and/or diagnosis of SARS-CoV-2 by FDA under an Emergency Use Authorization (EUA). This EUA will remain in effect (meaning this test can be used) for the duration of the COVID-19 declaration under Section 564(b)(1) of the Act, 21 U.S.C. section 360bbb-3(b)(1), unless the authorization is terminated or revoked.  Performed at [facility], [place] [address], [place], [place] [address]      Radiology Studies: DG Chest Port 1 View  Result Date: 12/28/2021 CLINICAL DATA:  Shortness of breath EXAM: PORTABLE CHEST 1 VIEW COMPARISON:  11/03/2021 FINDINGS: Unchanged cardiomegaly and aortic atherosclerosis. Redemonstrated extensive interstitial thickening, with possible additional airspace opacities in the lower lungs difficult to exclude. No definite pleural effusion. No acute osseous abnormality. IMPRESSION: Redemonstrated diffuse interstitial thickening, consistent with patient's known pulmonary fibrosis, with additional superimposed airspace opacity difficult to exclude given degree of fibrosis. Electronically Signed   By: Merilyn Baba M.D.   On: 12/28/2021 12:36      Scheduled Meds:  apixaban  5 mg Oral BID   arformoterol  15 mcg Nebulization BID   And   umeclidinium bromide  1 puff Inhalation Daily   buPROPion  150 mg Oral Daily   furosemide  40 mg Intravenous BID   insulin aspart  0-15 Units Subcutaneous TID WC   insulin glargine-yfgn  10 Units Subcutaneous Daily   ipratropium-albuterol  3 mL Nebulization QID   levothyroxine  75 mcg Oral QAC breakfast   methylPREDNISolone (SOLU-MEDROL) injection  60 mg Intravenous Q12H   metoprolol succinate  12.5 mg Oral Daily   nicotine  14 mg Transdermal Daily   pravastatin  40 mg Oral q1800   sodium chloride flush  3 mL Intravenous Q12H   Continuous Infusions:  sodium chloride       LOS: 0 days    Time spent: 22mn    PDomenic Polite MD [facility]   12/29/2021, 10:15 AM

## 2021-12-30 DIAGNOSIS — J849 Interstitial pulmonary disease, unspecified: Secondary | ICD-10-CM

## 2021-12-30 DIAGNOSIS — J449 Chronic obstructive pulmonary disease, unspecified: Secondary | ICD-10-CM

## 2021-12-30 LAB — GLUCOSE, CAPILLARY
Glucose-Capillary: 129 mg/dL — ABNORMAL HIGH (ref 70–99)
Glucose-Capillary: 109 mg/dL — ABNORMAL HIGH (ref 70–99)
Glucose-Capillary: 121 mg/dL — ABNORMAL HIGH (ref 70–99)
Glucose-Capillary: 174 mg/dL — ABNORMAL HIGH (ref 70–99)

## 2021-12-30 LAB — MAGNESIUM: Magnesium: 2.3 mg/dL (ref 1.7–2.4)

## 2021-12-30 LAB — BASIC METABOLIC PANEL
Anion gap: 8 (ref 5–15)
BUN: 31 mg/dL — ABNORMAL HIGH (ref 8–23)
CO2: 24 mmol/L (ref 22–32)
Calcium: 8.7 mg/dL — ABNORMAL LOW (ref 8.9–10.3)
Chloride: 104 mmol/L (ref 98–111)
Creatinine, Ser: 1.3 mg/dL — ABNORMAL HIGH (ref 0.44–1.00)
GFR, Estimated: 42 mL/min — ABNORMAL LOW (ref >60)
Glucose, Bld: 176 mg/dL — ABNORMAL HIGH (ref 70–99)
Potassium: 4.1 mmol/L (ref 3.5–5.1)
Sodium: 136 mmol/L (ref 135–145)

## 2021-12-30 LAB — CBC
HCT: 43.5 % (ref 36.0–46.0)
Hemoglobin: 14.3 g/dL (ref 12.0–15.0)
MCH: 33.7 pg (ref 26.0–34.0)
MCHC: 32.9 g/dL (ref 30.0–36.0)
MCV: 102.6 fL — ABNORMAL HIGH (ref 80.0–100.0)
Platelets: 145 10*3/uL — ABNORMAL LOW (ref 150–400)
RBC: 4.24 MIL/uL (ref 3.87–5.11)
RDW: 17.9 % — ABNORMAL HIGH (ref 11.5–15.5)
WBC: 13.1 10*3/uL — ABNORMAL HIGH (ref 4.0–10.5)
nRBC: 2.8 % — ABNORMAL HIGH (ref 0.0–0.2)

## 2021-12-30 LAB — BLOOD GAS, ARTERIAL
Acid-base deficit: 0.2 mmol/L (ref 0.0–2.0)
Bicarbonate: 23.7 mmol/L (ref 20.0–28.0)
Drawn by: 560021
FIO2: 48
O2 Saturation: 80.6 %
Patient temperature: 37
pCO2 arterial: 36.9 mmHg (ref 32.0–48.0)
pH, Arterial: 7.424 (ref 7.350–7.450)
pO2, Arterial: 48.5 mmHg — ABNORMAL LOW (ref 83.0–108.0)

## 2021-12-30 LAB — PROCALCITONIN: Procalcitonin: <0.1 ng/mL

## 2021-12-30 MED ORDER — IPRATROPIUM-ALBUTEROL 0.5-2.5 (3) MG/3ML IN SOLN
3.0000 mL | Freq: Three times a day (TID) | RESPIRATORY_TRACT | Status: DC
  Administered 2021-12-30 – 2021-12-31 (×2): 3 mL via RESPIRATORY_TRACT
  Filled 2021-12-30 (×2): qty 3

## 2021-12-30 MED ORDER — ALBUTEROL SULFATE (2.5 MG/3ML) 0.083% IN NEBU
2.5000 mg | INHALATION_SOLUTION | RESPIRATORY_TRACT | Status: DC | PRN
  Administered 2021-12-30: 2.5 mg via RESPIRATORY_TRACT

## 2021-12-30 MED ORDER — FUROSEMIDE 10 MG/ML IJ SOLN
60.0000 mg | Freq: Two times a day (BID) | INTRAMUSCULAR | Status: AC
  Administered 2021-12-30: 60 mg via INTRAVENOUS
  Filled 2021-12-30: qty 6

## 2021-12-30 NOTE — Progress Notes (Signed)
PROGRESS NOTE    MEEAH TOTINO  ION:[id number] DOB: Feb 21, 1941 DOA: 12/28/2021 PCP: Kathyrn Lass, MD  Brief Narrative: 80/F with history of chronic respiratory failure, COPD and interstitial lung disease on 5 L O2, severe pulmonary hypertension, chronic diastolic CHF, RV failure, type 2 diabetes mellitus, hypertension, paroxysmal atrial fibrillation, history of pulmonary embolism on Eliquis, PAD presented to the ED with acute worsening shortness of breath since yesterday.  When EMS arrived her O2 sats were 85% on 5 L, she was placed on BiPAP and transferred to the ED, reported missing some of her diuretic doses in the last few days. In the ED placed on BiPAP for respiratory distress, chest x-ray noted chronic findings, labs were unremarkable   Subjective: -Short of breath this morning, required BiPAP  Assessment & Plan:  Acute on chronic hypoxic respiratory failure, on 5L O2 at home 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 Severe PAH -Recent right heart cath 12/14 noted normal filling pressures, severe PAH and with low cardiac output, plan was to start Tyvaso -Patient admits to missing/taking less diuretics recently than usual -Echo: 10/2021-showed EF 60-65%, mildly decreased RV systolic function with mild RV enlargement, mild MR, moderate TR, PASP 85 mmHg -Cards following, mild clinical improvement, -2.0 L, continue IV Lasix 1 more day -Appreciate pulmonary input as well, ILD progression felt to be less likely, steroids discontinued -Continue to wean down O2 as tolerated, -Started BiPAP nightly, TOC consulted for trilogy -Palliative care consulted for goals of care, patient and daughter want aggressive management -Ambulate, PT eval  CKD (chronic kidney disease) stage IIIa -Stable, monitor with diuresis  Hyperkalemia- (present on admission) -Resolved  AF (paroxysmal atrial fibrillation) ([place])- (present on admission) -In sinus rhythm, continue  toprol-xl and eliquis   COPD ILD (interstitial lung disease) ([place])- (present on admission) Followed by pulmonology and recently seen this month 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  Considering anti-fibrotic therapy  -Wean O2 as tolerated  Tobacco abuse- (present on admission) Continue nicotine patch   Essential (primary) hypertension- (present on admission) Blood pressure mildly elevated Continue home medication: toprol-xl 12.84m daily   Type 2 diabetes mellitus with hyperglycemia ([place]- (present on admission) a1c in 10/2021: 7.6 -Continue Lantus, CBGs are stable now  Hyperlipidemia/PAD- (present on admission) Continue mevacor  Hypothyroidism- (present on admission) Last TSH 05/2021 and wnl Continue home synthroid  DVT prophylaxis: Eliquis Code Status: Full code Family Communication: Date of daughter 1/19 Disposition Plan: Home pending improvement in respiratory status inPatient  Consultants:  Cardiology Pulmonary  Procedures:   Antimicrobials:    Objective: Vitals:   12/30/21 0330 12/30/21 0449 12/30/21 0849 12/30/21 1127  BP: (!) 119/51  (!) 143/51 (!) 113/45  Pulse: 67 66 80 67  Resp: 15 18 (!) 23 18  Temp: 97.7 F (36.5 C)  (!) 97.3 F (36.3 C) 98 F (36.7 C)  TempSrc: Axillary  Oral Oral  SpO2: 97% 93% 90% 93%  Weight: 75.7 kg     Height:        Intake/Output Summary (Last 24 hours) at 12/30/2021 1155 Last data filed at 12/30/2021 0903 Gross per 24 hour  Intake 1080 ml  Output 1450 ml  Net -370 ml   Filed Weights   12/28/21 2006 12/29/21 0513 12/30/21 0330  Weight: 75.5 kg 75.1 kg 75.7 kg    Examination:  General exam: Chronically ill female, sitting up in bed, AAOx3, no distress HEENT: + JVD CVS: S1-S2, regular rate rhythm Lungs: Bilateral rales worse in  the lower lobes Abdomen: Soft, nontender, bowel sounds present Extremities: No edema  Skin: No rashes Psychiatry: Judgement and insight appear normal. Mood & affect appropriate.      Data Reviewed:   CBC: Recent Labs  Lab 12/28/21 1217 12/29/21 0331 12/30/21 0320  WBC 8.7 6.1 13.1*  NEUTROABS 6.6  --   --   HGB 14.3 14.0 14.3  HCT 46.0 41.9 43.5  MCV 106.2* 102.2* 102.6*  PLT 160 132* 696*   Basic Metabolic Panel: Recent Labs  Lab 12/28/21 1315 12/29/21 0331 12/30/21 0320  NA 140 137 136  K 5.2* 4.4 4.1  CL 104 107 104  CO2 25 21* 24  GLUCOSE 129* 292* 176*  BUN 28* 30* 31*  CREATININE 1.26* 1.31* 1.30*  CALCIUM 9.6 8.4* 8.7*  MG  --   --  2.3   GFR: Estimated Creatinine Clearance: 36.5 mL/min (A) (by C-G formula based on SCr of 1.3 mg/dL (H)). Liver Function Tests: Recent Labs  Lab 12/28/21 1315  AST 38  ALT 21  ALKPHOS 54  BILITOT 1.4*  PROT 7.5  ALBUMIN 3.3*   No results for input(s): LIPASE, AMYLASE in the last 168 hours. No results for input(s): AMMONIA in the last 168 hours. Coagulation Profile: No results for input(s): INR, PROTIME in the last 168 hours. Cardiac Enzymes: No results for input(s): CKTOTAL, CKMB, CKMBINDEX, TROPONINI in the last 168 hours. BNP (last 3 results) No results for input(s): PROBNP in the last 8760 hours. HbA1C: No results for input(s): HGBA1C in the last 72 hours. CBG: Recent Labs  Lab 12/29/21 1114 12/29/21 1540 12/29/21 2121 12/30/21 0610 12/30/21 1130  GLUCAP 306* 227* 312* 129* 109*   Lipid Profile: No results for input(s): CHOL, HDL, LDLCALC, TRIG, CHOLHDL, LDLDIRECT in the last 72 hours. Thyroid Function Tests: No results for input(s): TSH, T4TOTAL, FREET4, T3FREE, THYROIDAB in the last 72 hours. Anemia Panel: No results for input(s): VITAMINB12, FOLATE, FERRITIN, TIBC, IRON, RETICCTPCT in the last 72 hours. Urine analysis:    Component Value Date/Time   COLORURINE YELLOW 12/28/2021 1510   APPEARANCEUR CLEAR 12/28/2021 1510   APPEARANCEUR Clear 09/20/2021 1449   LABSPEC 1.010 12/28/2021 1510   PHURINE 6.0 12/28/2021 1510   GLUCOSEU NEGATIVE 12/28/2021 1510   HGBUR NEGATIVE  12/28/2021 [place] 12/28/2021 1510   BILIRUBINUR Negative 09/20/2021 [place] 12/28/2021 1510   PROTEINUR NEGATIVE 12/28/2021 1510   NITRITE NEGATIVE 12/28/2021 1510   LEUKOCYTESUR NEGATIVE 12/28/2021 1510   Sepsis Labs: _0 (procalcitonin:4,lacticidven:4)  ) Recent Results (from the past 240 hour(s))  Resp Panel by RT-PCR (Flu A&B, Covid) Nasopharyngeal Swab     Status: None   Collection Time: 12/28/21  3:50 PM   Specimen: Nasopharyngeal Swab; Nasopharyngeal(NP) swabs in vial transport medium  Result Value Ref Range Status   SARS Coronavirus 2 by RT PCR NEGATIVE NEGATIVE Final    Comment: (NOTE) SARS-CoV-2 target nucleic acids are NOT DETECTED.  The SARS-CoV-2 RNA is generally detectable in upper respiratory specimens during the acute phase of infection. The lowest concentration of SARS-CoV-2 viral copies this assay can detect is 138 copies/mL. A negative result does not preclude SARS-Cov-2 infection and should not be used as the sole basis for treatment or other patient management decisions. A negative result may occur with  improper specimen collection/handling, submission of specimen other than nasopharyngeal swab, presence of viral mutation(s) within the areas targeted by this assay, and inadequate number of viral copies(<138 copies/mL). A negative result must  be combined with clinical observations, patient history, and epidemiological information. The expected result is Negative.  Fact Sheet for Patients:  EntrepreneurPulse.com.au  Fact Sheet for Healthcare Providers:  IncredibleEmployment.be  This test is no t yet approved or cleared by the [place] FDA and  has been authorized for detection and/or diagnosis of SARS-CoV-2 by FDA under an Emergency Use Authorization (EUA). This EUA will remain  in effect (meaning this test can be used) for the duration of the COVID-19 declaration under  Section 564(b)(1) of the Act, 21 U.S.C.section 360bbb-3(b)(1), unless the authorization is terminated  or revoked sooner.       Influenza A by PCR NEGATIVE NEGATIVE Final   Influenza B by PCR NEGATIVE NEGATIVE Final    Comment: (NOTE) The Xpert Xpress SARS-CoV-2/FLU/RSV plus assay is intended as an aid in the diagnosis of influenza from Nasopharyngeal swab specimens and should not be used as a sole basis for treatment. Nasal washings and aspirates are unacceptable for Xpert Xpress SARS-CoV-2/FLU/RSV testing.  Fact Sheet for Patients: EntrepreneurPulse.com.au  Fact Sheet for Healthcare Providers: IncredibleEmployment.be  This test is not yet approved or cleared by the [place] FDA and has been authorized for detection and/or diagnosis of SARS-CoV-2 by FDA under an Emergency Use Authorization (EUA). This EUA will remain in effect (meaning this test can be used) for the duration of the COVID-19 declaration under Section 564(b)(1) of the Act, 21 U.S.C. section 360bbb-3(b)(1), unless the authorization is terminated or revoked.  Performed at [facility], [place] [address] [place], [place] [address]      Radiology Studies: DG Chest Port 1 View  Result Date: 12/28/2021 CLINICAL DATA:  Shortness of breath EXAM: PORTABLE CHEST 1 VIEW COMPARISON:  11/03/2021 FINDINGS: Unchanged cardiomegaly and aortic atherosclerosis. Redemonstrated extensive interstitial thickening, with possible additional airspace opacities in the lower lungs difficult to exclude. No definite pleural effusion. No acute osseous abnormality. IMPRESSION: Redemonstrated diffuse interstitial thickening, consistent with patient's known pulmonary fibrosis, with additional superimposed airspace opacity difficult to exclude given degree of fibrosis. Electronically Signed   By: Merilyn Baba M.D.   On: 12/28/2021 12:36     Scheduled Meds:  apixaban  5 mg Oral BID   arformoterol  15  mcg Nebulization BID   And   umeclidinium bromide  1 puff Inhalation Daily   budesonide (PULMICORT) nebulizer solution  0.25 mg Nebulization BID   buPROPion  150 mg Oral Daily   furosemide  60 mg Intravenous BID   insulin aspart  0-15 Units Subcutaneous TID WC   insulin aspart  3 Units Subcutaneous TID WC   insulin glargine-yfgn  20 Units Subcutaneous Daily   ipratropium-albuterol  3 mL Nebulization TID   levothyroxine  75 mcg Oral QAC breakfast   metoprolol succinate  12.5 mg Oral Daily   nicotine  14 mg Transdermal Daily   pravastatin  40 mg Oral q1800   sodium chloride flush  3 mL Intravenous Q12H   Continuous Infusions:  sodium chloride       LOS: 1 day    Time spent: 47mn  PDomenic Polite MD [facility]   12/30/2021, 11:55 AM

## 2021-12-30 NOTE — TOC Initial Note (Signed)
Transition of Care [facility]) - Initial/Assessment Note    Patient Details  Name: Elizabeth Melton MRN: [id number] Date of Birth: 12/30/1940  Transition of [facility]) CM/SW Contact:    Erenest Rasher, RN Phone Number: [phone] 12/30/2021, 5:21 PM  Clinical Narrative:                 HF TOC CM spoke to pt at bedside. States she lives with her dtr but dtr has medical issues that limits her ability to assist pt in the home. She has oxygen with [employer]. Pt may need BIPAP for home. [place], [place] to check pt insurance to see if they were in network. Contacted [employer] to see if they were in network. Will need arterial ABG check for PCO2 to qualify for Bipap or NIV at home. Attending made aware.   Expected Discharge Plan: [place] Barriers to Discharge: Continued Medical Work up   Patient Goals and CMS Choice Patient states their goals for this hospitalization and ongoing recovery are:: wants to get better CMS Medicare.gov Compare Post Acute Care list provided to:: Patient Choice offered to / list presented to : Patient  Expected Discharge Plan and Services Expected Discharge Plan: [place]   Discharge Planning Services: CM Consult Post Acute Care Choice: [place] arrangements for the past 2 months: Single Family Home                   DME Agency: [employer] Date DME Agency Contacted: 12/30/21 Time DME Agency Contacted: 70 Representative spoke with at DME Agency: [place] Arrangements/Services Living arrangements for the past 2 months: [place] with:: Adult Children   Do you feel safe going back to the place where you live?: Yes      Need for Family Participation in Patient Care: Yes (Comment)   Current home services: DME (rollator, scale) Criminal Activity/Legal Involvement Pertinent to Current Situation/Hospitalization: No - Comment as needed  Activities of  Daily Living Home Assistive Devices/Equipment: Cane (specify quad or straight), Walker (specify type) ADL Screening (condition at time of admission) Patient's cognitive ability adequate to safely complete daily activities?: Yes Is the patient deaf or have difficulty hearing?: No Does the patient have difficulty seeing, even when wearing glasses/contacts?: Yes Does the patient have difficulty concentrating, remembering, or making decisions?: No Patient able to express need for assistance with ADLs?: Yes Does the patient have difficulty dressing or bathing?: Yes Independently performs ADLs?: Yes (appropriate for developmental age) Does the patient have difficulty walking or climbing stairs?: Yes Weakness of Legs: Both Weakness of Arms/Hands: None  Permission Sought/Granted Permission sought to share information with : Case Manager, Family Supports, PCP Permission granted to share information with : Yes, Verbal Permission Granted  Share Information with NAME: Malka So  Permission granted to share info w AGENCY: Home Health, DME  Permission granted to share info w Relationship: daughter  Permission granted to share info w Contact Information: [phone]  Emotional Assessment Appearance:: Appears stated age Attitude/Demeanor/Rapport: Gracious Affect (typically observed): Accepting Orientation: : Oriented to Self, Oriented to Place, Oriented to  Time, Oriented to Situation   Psych Involvement: No (comment)  Admission diagnosis:  Respiratory distress [R06.03] Acute on chronic diastolic CHF (congestive heart failure) (HCC) [I50.33] Acute respiratory failure ([place]) [J96.00] Patient Active Problem List   Diagnosis Date Noted   Acute respiratory  failure ([place]) 12/29/2021   COPD with chronic bronchitis and emphysema (HCC)    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  12/28/2021   Hyperkalemia 12/28/2021   CKD (chronic kidney disease) 12/28/2021    Acute right-sided CHF (congestive heart failure) ([place]) 11/06/2021   Elevated troponin 11/03/2021   Constipation 11/03/2021   Pulmonary HTN ([place])    Acute on chronic systolic (congestive) heart failure ([place]) 05/19/2021   Acute on chronic respiratory failure with hypoxia (HCC) 05/19/2021   SOB (shortness of breath) 05/19/2021   AF (paroxysmal atrial fibrillation) ([place]) 05/19/2021   Acute on chronic congestive heart failure ([place])    Demand ischemia (HCC)    Acute respiratory failure with hypoxia ([place]) 09/06/2020   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   ILD (interstitial lung disease) ([place]) 10/23/2019   Chronic respiratory failure with hypoxia ([place]) 10/23/2019   Essential (primary) hypertension 09/18/2018   PAD (peripheral artery disease) ([place]) 09/18/2018   Tobacco abuse 09/18/2018   Aortic regurgitation 09/18/2018   Hypothyroidism    Hyperlipidemia/PAD    Type 2 diabetes mellitus with hyperglycemia ([place])    PCP:  Kathyrn Lass, MD Pharmacy:   [id number] [place] [id number]: [phone]: [phone]    Social Determinants of Health (SDOH) Interventions    Readmission Risk Interventions Readmission Risk Prevention Plan 09/10/2020  Post Dischage Appt Complete  Medication Screening Complete  Transportation Screening Complete  Some recent data might be hidden

## 2021-12-30 NOTE — Progress Notes (Addendum)
Notified by lab  that they were unable to run abg due to machine malfunction. Notified Md.  [phone] notified by lab that machine was working at this time

## 2021-12-30 NOTE — Plan of Care (Signed)

## 2021-12-30 NOTE — Progress Notes (Addendum)
Advanced Heart Failure Rounding Note  PCP-Cardiologist: [name], MD   Subjective:    1.7 L in measured UOP + 2 unmeasured voids yesterday w/ IV Lasix. Wt gain + 1 lb. ? If accurate.   Scr stable w/ diuresis, 1.3  K 4.1   WBC 6.1>>13.1 but AF. ?  If 2/2 recent steroids   She remains SOB and dyspneic w/ conversation. C/w high supp O2 requirements, on 8L HFNC overnight (home baseline ~5L/min). Still w/ mild fluid overload on exam.    Objective:   Weight Range: 75.7 kg Body mass index is 26.92 kg/m.   Vital Signs:   Temp:  [97.7 F (36.5 C)-98.5 F (36.9 C)] 97.7 F (36.5 C) (01/20 0330) Pulse Rate:  [63-82] 66 (01/20 0449) Resp:  [14-24] 18 (01/20 0449) BP: (112-134)/(51-97) 119/51 (01/20 0330) SpO2:  [89 %-97 %] 93 % (01/20 0449) FiO2 (%):  [40 %] 40 % (01/20 0159) Weight:  [75.7 kg] 75.7 kg (01/20 0330) Last BM Date: 12/27/21  Weight change: Filed Weights   12/28/21 2006 12/29/21 0513 12/30/21 0330  Weight: 75.5 kg 75.1 kg 75.7 kg    Intake/Output:   Intake/Output Summary (Last 24 hours) at 12/30/2021 0828 Last data filed at 12/30/2021 0400 Gross per 24 hour  Intake 960 ml  Output 1650 ml  Net -690 ml      Physical Exam    General:  SOB, dyspneic w/ conversation, on HFNC  HEENT: Normal Neck: Supple. JVP 10 cm . Carotids 2+ bilat; no bruits. No lymphadenopathy or thyromegaly appreciated. Cor: PMI nondisplaced. Regular rate & rhythm. No rubs, gallops or murmurs. Lungs: bilateral crackles (ILD)  Abdomen: Soft, nontender, nondistended. No hepatosplenomegaly. No bruits or masses. Good bowel sounds. Extremities: No cyanosis, clubbing, rash, edema Neuro: Alert & orientedx3, cranial nerves grossly intact. moves all 4 extremities w/o difficulty. Affect pleasant   Telemetry   NSR 70s   EKG    No new EKG to review   Labs    CBC Recent Labs    12/28/21 1217 12/29/21 0331 12/30/21 0320  WBC 8.7 6.1 13.1*  NEUTROABS 6.6  --   --    HGB 14.3 14.0 14.3  HCT 46.0 41.9 43.5  MCV 106.2* 102.2* 102.6*  PLT 160 132* 569*   Basic Metabolic Panel Recent Labs    12/29/21 0331 12/30/21 0320  NA 137 136  K 4.4 4.1  CL 107 104  CO2 21* 24  GLUCOSE 292* 176*  BUN 30* 31*  CREATININE 1.31* 1.30*  CALCIUM 8.4* 8.7*  MG  --  2.3   Liver Function Tests Recent Labs    12/28/21 1315  AST 38  ALT 21  ALKPHOS 54  BILITOT 1.4*  PROT 7.5  ALBUMIN 3.3*   No results for input(s): LIPASE, AMYLASE in the last 72 hours. Cardiac Enzymes No results for input(s): CKTOTAL, CKMB, CKMBINDEX, TROPONINI in the last 72 hours.  BNP: BNP (last 3 results) Recent Labs    11/18/21 1610 12/28/21 1218 12/29/21 0331  BNP 241.9* 565.2* 821.0*    ProBNP (last 3 results) No results for input(s): PROBNP in the last 8760 hours.   D-Dimer No results for input(s): DDIMER in the last 72 hours. Hemoglobin A1C No results for input(s): HGBA1C in the last 72 hours. Fasting Lipid Panel No results for input(s): CHOL, HDL, LDLCALC, TRIG, CHOLHDL, LDLDIRECT in the last 72 hours. Thyroid Function Tests No results for input(s): TSH, T4TOTAL, T3FREE, THYROIDAB in the last 72 hours.  Invalid input(s): FREET3  Other results:   Imaging    No results found.   Medications:     Scheduled Medications:  apixaban  5 mg Oral BID   arformoterol  15 mcg Nebulization BID   And   umeclidinium bromide  1 puff Inhalation Daily   budesonide (PULMICORT) nebulizer solution  0.25 mg Nebulization BID   buPROPion  150 mg Oral Daily   insulin aspart  0-15 Units Subcutaneous TID WC   insulin aspart  3 Units Subcutaneous TID WC   insulin glargine-yfgn  20 Units Subcutaneous Daily   ipratropium-albuterol  3 mL Nebulization Q4H   levothyroxine  75 mcg Oral QAC breakfast   metoprolol succinate  12.5 mg Oral Daily   nicotine  14 mg Transdermal Daily   pravastatin  40 mg Oral q1800   sodium chloride flush  3 mL Intravenous Q12H    Infusions:   sodium chloride      PRN Medications: sodium chloride, acetaminophen **OR** acetaminophen, guaiFENesin-dextromethorphan, sodium chloride flush    Patient Profile   Mr Feher is a 81 year old with a history of PE, PAF, ILD, COPD, and diastolic CHF with RV failure, and HFpEF.    Admitted with A/C respiratory failure + A/C HFpEF  Assessment/Plan   1. A/C Hypoxic Respiratory Failure - On 5 liters at home. Placed on Bipap in the ED and had weaned back to 5 liters. - Today, higher O2 requirements, 8L HFNC  - Multifactorial (ILD, PAH and A/c CHF) - Continue IV Lasix today for volume overload  - c/w nebs/ bronchodilators    2. A/C HFpEF---> RV Failure Echo in 11/22 showed EF 60-65%, mildly decreased RV systolic function with mild RV enlargement, mild MR, moderate TR, PASP 85 mmHg, mild aortic stenosis and mild aortic insufficiency, IVC dilated. The RV failure may be primarily due to pulmonary hypertension. RHC in 12/22 showed normal filling pressures with severe PH. - Volume overloaded in setting of poor compliance w/ home diuretic regimen - Renal function stable.  - Continue IV Lasix 60 mg bid    3. PAH -Pulmonary venous hypertension on 7/22 RHC.  However, last echo in 11/22 showed PASP up to 85 mmHg. No evidence for chronic PE on 9/22 V/Q scan.  RHC in 12/22 showed normal filling pressures with severe pulmonary arterial hypertension.  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    - With PH-ILD, a trial of Tyvaso-DPI planned. Tyvaso application pending. I have reached out to outpatient HF pharmacy team.    4. PAF  -Maintaining SR.  -On eliquis 5 mg twice a day.    5. ILD - followed by Dr. [name]  - seen by pulmonary here, doubt acute flare. IV methylprednisolone stopped    6. DMII -On SSI   7. CKD Stage IIIa -Creatinine 1.3>1.3>1.30  -Daily BMET    8.   Hypothyroidism -On Levothyroxine.    9.Tobacco Abuse -Last smoked 2 weeks ago.    10. Leukocytosis  - WBC 6.1>>13.1. Afebrile - Suspect 2/2 recent steroids  - Given higher O2 requirements, check PCT to r/o PNA   Length of Stay: 80  Brittainy Simmons, PA-C  12/30/2021, 8:28 AM  Advanced Heart Failure Team Pager [phone] (M-F; 7a - 5p)  Please contact [facility] for night-coverage after hours (5p -7a ) and weekends on [email or web address]  Patient seen with PA, agree with the above note.   Breathing is getting better, currently on 7L  . I/Os negative but not fully recorded.    General: NAD Neck: JVP 10 cm, no thyromegaly or thyroid nodule.  Lungs: Dry crackles at baseline.  CV: Nondisplaced PMI.  Heart regular S1/S2, no S3/S4, no murmur.  No peripheral edema.   Abdomen: Soft, nontender, no hepatosplenomegaly, no distention.  Skin: Intact without lesions or rashes.  Neurologic: Alert and oriented x 3.  Psych: Normal affect. Extremities: No clubbing or cyanosis.  HEENT: Normal.   Severe PH (PH-ILD) with RV failure.  Volume overloaded on exam but improving.  - Continue Lasix 60 mg IV bid today, creatinine stable at 1.3. - Reassess tomorrow, may be ready to start po torsemide, would use 40 mg bid.  - Trying to arrange for Tyvaso as outpatient.  Loralie Champagne 12/30/2021 1:53 PM

## 2021-12-30 NOTE — Progress Notes (Signed)
NAME:  Elizabeth Melton, MRN:  [id number], DOB:  [birth year], LOS: 1 ADMISSION DATE:  12/28/2021, CONSULTATION DATE:  1/19 REFERRING MD:  Dr. Broadus Stanely Sexson, CHIEF COMPLAINT:  Acute respiratory failure w/ hypoxia   History of Present Illness:  Patient is a 81 year-old female with pertinent PMH chronic diastolic CHF (patient of Dr. Aundra Dubin), severe pulm hypertension, chronic respiratory failure, COPD, ILD (patient of Dr. [name]) on 5L O2 [facility] to [facility] on 1/18 with respiratory distress.  On 1/18 admitted to [facility] ED.  Sats initially 85% on 5L [place].  Her sats are typically 88 to 89%.  Patient placed on BiPAP.  States she missed some of her water pills.  Denies fever.  BNP 565.  CXR showing chronic lung injury.  Lasix given.  Albuterol neb given.  Initially no signs of eczema/ILD flare.  Admitted to [facility] on the floors.   On 1/19 patient continuing to have desaturation episodes and requiring BiPAP more often.  Patient now on [place] HFNC 8L with sats 90%.  Questionable ILD is cause of respiratory distress.  PCCM asked for pulm consult.   Pertinent  Medical History        Past Medical History:  Diagnosis Date   Acute on chronic diastolic (congestive) heart failure ([place]) 05/19/2021   Acute respiratory failure with hypoxia (HCC) 09/06/2020   Atrial fibrillation with RVR (HCC)     Diabetes mellitus without complication (HCC)     Hyperlipidemia     Hypertension     Hypothyroidism     IPF (idiopathic pulmonary fibrosis) ([place])     Pneumonia due to COVID-19 virus 01/16/2021   Thrombocytopenia ([place])          Significant Hospital Events: Including procedures, antibiotic start and stop dates in addition to other pertinent events   1/18: admitted to [facility] 1/19: PCCM consulted for inability to wean from BiPAP 1/20: on salter HFNC 8L   Interim History / Subjective:  Patient up in bed and eating breakfast Patient states her breathing feels good On salter HF 8 L w/ sats 94% Wore BiPAP overnight to sleep UOP  last 24 hours 1.6 L Tolerated mobilizing yesterday with help   Objective   Blood pressure (!) 143/51, pulse 80, temperature (!) 97.3 F (36.3 C), temperature source Oral, resp. rate (!) 23, height _0  (1.676 m), weight 75.7 kg, SpO2 90 %.    >  FiO2 (%):  [40 %] 40 %      Intake/Output Summary (Last 24 hours) at 12/30/2021 0855 Last data filed at 12/30/2021 4332    Gross per 24 hour  Intake 960 ml  Output 2050 ml  Net -1090 ml           Filed Weights    12/28/21 2006 12/29/21 0513 12/30/21 0330  Weight: 75.5 kg 75.1 kg 75.7 kg      Examination: General:  elderly appearing patient; NAD HEENT: MM pink/moist; salter [place] in place Neuro: Aox3; MAE CV: s1s2, no m/r/g PULM:  dim clear BS bilaterally; salter [place] 8 L; desats down to 85% when speaking and 92% at rest; bipap on standby GI: soft, bsx4 active  Extremities: warm/dry, no edema  Skin: no rashes or lesions appreciated   CXR 1/18: diffuse interstitial thickening   Resolved Hospital Problem list       Assessment & Plan:  Acute on chronic hypoxic respiratory failure 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 Severe PAH Possible OSA: sleep study 1/10: results pending P: -continue diuresis per  HF team -patient states she has not been taking lasix at home due to urinary pain -azo given -consider starting Tyvaso -currently on 8 L salter [place] (wears 5 L at home) -wean for Sat goal 88-94% -bipap qhs -may need cpap for home at discharge pending sleepy study results on 1/10 -trend cxr   COPD: on spiriva and albuterol prn ILD: on 5 L [place] @ home; patient of Ramaswamy P: -this does not appear to be ILD flare; steroids stopped -duoneb scheduled -continue brovana, incruse ellipta, and pulmicort -Pulm toiletry: IS/flutter -HRCT planned for earlier this month; will hold off while in acute exacerbation; will need to get this done before pulmonary appointment -will need follow up outpatient to consider starting  antifibrotics -consider pulmonary rehab outpatient -[facility] pulmonary outpatient appointment made on 2/10 at 9:30     Best Practice (right click and "Reselect all SmartList Selections" daily)    Diet/type: per primary DVT prophylaxis: per primary GI prophylaxis: per primary Lines: NA Foley:  NA Code Status:  full code Last date of multidisciplinary goals of care discussion [per primary]             JD Rexene Agent [facility] 12/30/2021, 8:55 AM   Please see [email or web address] for pager details.   From 7A-7P if no response, please call [phone]. After hours, please call ELink [phone].

## 2021-12-31 DIAGNOSIS — I480 Paroxysmal atrial fibrillation: Secondary | ICD-10-CM

## 2021-12-31 LAB — BASIC METABOLIC PANEL WITH GFR
Anion gap: 8 (ref 5–15)
BUN: 31 mg/dL — ABNORMAL HIGH (ref 8–23)
CO2: 26 mmol/L (ref 22–32)
Calcium: 8.9 mg/dL (ref 8.9–10.3)
Chloride: 105 mmol/L (ref 98–111)
Creatinine, Ser: 1.26 mg/dL — ABNORMAL HIGH (ref 0.44–1.00)
GFR, Estimated: 43 mL/min — ABNORMAL LOW
Glucose, Bld: 192 mg/dL — ABNORMAL HIGH (ref 70–99)
Potassium: 4.3 mmol/L (ref 3.5–5.1)
Sodium: 139 mmol/L (ref 135–145)

## 2021-12-31 LAB — GLUCOSE, CAPILLARY
Glucose-Capillary: 120 mg/dL — ABNORMAL HIGH (ref 70–99)
Glucose-Capillary: 156 mg/dL — ABNORMAL HIGH (ref 70–99)
Glucose-Capillary: 158 mg/dL — ABNORMAL HIGH (ref 70–99)
Glucose-Capillary: 123 mg/dL — ABNORMAL HIGH (ref 70–99)

## 2021-12-31 MED ORDER — FUROSEMIDE 10 MG/ML IJ SOLN
40.0000 mg | Freq: Once | INTRAMUSCULAR | Status: AC
  Administered 2021-12-31: 40 mg via INTRAVENOUS
  Filled 2021-12-31: qty 4

## 2021-12-31 MED ORDER — POLYETHYLENE GLYCOL 3350 17 G PO PACK
17.0000 g | PACK | Freq: Every day | ORAL | Status: DC
  Administered 2021-12-31 – 2022-01-06 (×6): 17 g via ORAL
  Filled 2021-12-31 (×7): qty 1

## 2021-12-31 MED ORDER — GUAIFENESIN ER 600 MG PO TB12
600.0000 mg | ORAL_TABLET | Freq: Two times a day (BID) | ORAL | Status: DC
  Administered 2021-12-31 – 2022-01-13 (×26): 600 mg via ORAL
  Filled 2021-12-31 (×27): qty 1

## 2021-12-31 MED ORDER — IPRATROPIUM-ALBUTEROL 0.5-2.5 (3) MG/3ML IN SOLN
3.0000 mL | Freq: Two times a day (BID) | RESPIRATORY_TRACT | Status: DC
  Administered 2021-12-31 – 2022-01-13 (×24): 3 mL via RESPIRATORY_TRACT
  Filled 2021-12-31 (×25): qty 3

## 2021-12-31 NOTE — Plan of Care (Signed)

## 2021-12-31 NOTE — Progress Notes (Signed)
PROGRESS NOTE    Elizabeth Melton  IRW:[id number] DOB: 11/22/1941 DOA: 12/28/2021 PCP: Kathyrn Lass, MD  Brief Narrative: 80/F with history of chronic respiratory failure, COPD and interstitial lung disease on 5 L O2, severe pulmonary hypertension, chronic diastolic CHF, RV failure, type 2 diabetes mellitus, hypertension, paroxysmal atrial fibrillation, history of pulmonary embolism on Eliquis, PAD presented to the ED with acute worsening shortness of breath since yesterday.  When EMS arrived her O2 sats were 85% on 5 L, she was placed on BiPAP and transferred to the ED, reported missing some of her diuretic doses in the last few days. In the ED placed on BiPAP for respiratory distress, chest x-ray noted chronic findings, labs were unremarkable   Subjective: -Short of breath this morning, required BiPAP  Assessment & Plan:  Acute on chronic hypoxic respiratory failure, on 5L O2 at home 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 Severe PAH -Recent right heart cath 12/14 noted normal filling pressures, severe PAH and with low cardiac output, plan was to start Tyvaso -Patient admits to missing/taking less diuretics recently than usual -Echo: 10/2021-showed EF 60-65%, mildly decreased RV systolic function with mild RV enlargement, mild MR, moderate TR, PASP 85 mmHg -Cards following, mild clinical improvement, -2.5L, continue IV lasix today -Appreciate pulmonary input as well, ILD progression felt to be less likely, steroids discontinued -Started BiPAP nightly, TOC consulted for trilogy -Palliative care consulted for goals of care, patient and daughter want aggressive management -Increase activity, ambulate as tolerated -Back on 8 L O2 this morning, dropped to 7 L now, attempt to wean oxygen as tolerated  CKD (chronic kidney disease) stage IIIa -Stable, monitor with diuresis  Hyperkalemia- (present on admission) -Resolved  AF (paroxysmal atrial  fibrillation) ([place])- (present on admission) -In sinus rhythm, continue toprol-xl and eliquis   COPD ILD (interstitial lung disease) ([place])- (present on admission) Followed by pulmonology and recently seen this month 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  Considering anti-fibrotic therapy  -Wean O2 as tolerated  Tobacco abuse- (present on admission) Continue nicotine patch   Essential (primary) hypertension- (present on admission) Blood pressure mildly elevated Continue home medication: toprol-xl 12.66m daily   Type 2 diabetes mellitus with hyperglycemia ([place]- (present on admission) a1c in 10/2021: 7.6 -Continue Lantus, CBGs are stable now  Hyperlipidemia/PAD- (present on admission) Continue mevacor  Hypothyroidism- (present on admission) Last TSH 05/2021 and wnl Continue home synthroid  DVT prophylaxis: Eliquis Code Status: Full code Family Communication: Date of daughter 1/19 Disposition Plan: Home pending improvement in respiratory status inPatient  Consultants:  Cardiology Pulmonary  Procedures:   Antimicrobials:    Objective: Vitals:   12/31/21 0748 12/31/21 0844 12/31/21 0900 12/31/21 1120  BP: 124/66   (!) 117/45  Pulse: 64  72   Resp: 19     Temp: 97.6 F (36.4 C)     TempSrc: Oral     SpO2: 95% 92%    Weight:      Height:        Intake/Output Summary (Last 24 hours) at 12/31/2021 1141 Last data filed at 12/31/2021 0748 Gross per 24 hour  Intake 960 ml  Output 1700 ml  Net -740 ml   Filed Weights   12/29/21 0513 12/30/21 0330 12/31/21 0506  Weight: 75.1 kg 75.7 kg 75.4 kg    Examination:  General exam: Chronically ill female, sitting up in bed, AAOx3, no distress HEENT: + JVD CVS: S1-S2, regular rate rhythm Lungs: Bilateral rales worse in the  lower lobes Abdomen: Soft, nontender, bowel sounds present Extremities: No edema  Skin: No rashes Psychiatry: Judgement and insight appear normal. Mood & affect appropriate.     Data Reviewed:    CBC: Recent Labs  Lab 12/28/21 1217 12/29/21 0331 12/30/21 0320  WBC 8.7 6.1 13.1*  NEUTROABS 6.6  --   --   HGB 14.3 14.0 14.3  HCT 46.0 41.9 43.5  MCV 106.2* 102.2* 102.6*  PLT 160 132* 161*   Basic Metabolic Panel: Recent Labs  Lab 12/28/21 1315 12/29/21 0331 12/30/21 0320 12/31/21 0915  NA 140 137 136 139  K 5.2* 4.4 4.1 4.3  CL 104 107 104 105  CO2 25 21* 24 26  GLUCOSE 129* 292* 176* 192*  BUN 28* 30* 31* 31*  CREATININE 1.26* 1.31* 1.30* 1.26*  CALCIUM 9.6 8.4* 8.7* 8.9  MG  --   --  2.3  --    GFR: Estimated Creatinine Clearance: 37.6 mL/min (A) (by C-G formula based on SCr of 1.26 mg/dL (H)). Liver Function Tests: Recent Labs  Lab 12/28/21 1315  AST 38  ALT 21  ALKPHOS 54  BILITOT 1.4*  PROT 7.5  ALBUMIN 3.3*   No results for input(s): LIPASE, AMYLASE in the last 168 hours. No results for input(s): AMMONIA in the last 168 hours. Coagulation Profile: No results for input(s): INR, PROTIME in the last 168 hours. Cardiac Enzymes: No results for input(s): CKTOTAL, CKMB, CKMBINDEX, TROPONINI in the last 168 hours. BNP (last 3 results) No results for input(s): PROBNP in the last 8760 hours. HbA1C: No results for input(s): HGBA1C in the last 72 hours. CBG: Recent Labs  Lab 12/30/21 0610 12/30/21 1130 12/30/21 1549 12/30/21 2058 12/31/21 0612  GLUCAP 129* 109* 121* 174* 120*   Lipid Profile: No results for input(s): CHOL, HDL, LDLCALC, TRIG, CHOLHDL, LDLDIRECT in the last 72 hours. Thyroid Function Tests: No results for input(s): TSH, T4TOTAL, FREET4, T3FREE, THYROIDAB in the last 72 hours. Anemia Panel: No results for input(s): VITAMINB12, FOLATE, FERRITIN, TIBC, IRON, RETICCTPCT in the last 72 hours. Urine analysis:    Component Value Date/Time   COLORURINE YELLOW 12/28/2021 1510   APPEARANCEUR CLEAR 12/28/2021 1510   APPEARANCEUR Clear 09/20/2021 1449   LABSPEC 1.010 12/28/2021 1510   PHURINE 6.0 12/28/2021 1510   GLUCOSEU NEGATIVE  12/28/2021 1510   HGBUR NEGATIVE 12/28/2021 [place] 12/28/2021 1510   BILIRUBINUR Negative 09/20/2021 [place] 12/28/2021 1510   PROTEINUR NEGATIVE 12/28/2021 1510   NITRITE NEGATIVE 12/28/2021 1510   LEUKOCYTESUR NEGATIVE 12/28/2021 1510   Sepsis Labs: _0 (procalcitonin:4,lacticidven:4)  ) Recent Results (from the past 240 hour(s))  Resp Panel by RT-PCR (Flu A&B, Covid) Nasopharyngeal Swab     Status: None   Collection Time: 12/28/21  3:50 PM   Specimen: Nasopharyngeal Swab; Nasopharyngeal(NP) swabs in vial transport medium  Result Value Ref Range Status   SARS Coronavirus 2 by RT PCR NEGATIVE NEGATIVE Final    Comment: (NOTE) SARS-CoV-2 target nucleic acids are NOT DETECTED.  The SARS-CoV-2 RNA is generally detectable in upper respiratory specimens during the acute phase of infection. The lowest concentration of SARS-CoV-2 viral copies this assay can detect is 138 copies/mL. A negative result does not preclude SARS-Cov-2 infection and should not be used as the sole basis for treatment or other patient management decisions. A negative result may occur with  improper specimen collection/handling, submission of specimen other than nasopharyngeal swab, presence of viral mutation(s) within the areas targeted by this  assay, and inadequate number of viral copies(<138 copies/mL). A negative result must be combined with clinical observations, patient history, and epidemiological information. The expected result is Negative.  Fact Sheet for Patients:  EntrepreneurPulse.com.au  Fact Sheet for Healthcare Providers:  IncredibleEmployment.be  This test is no t yet approved or cleared by the [place] FDA and  has been authorized for detection and/or diagnosis of SARS-CoV-2 by FDA under an Emergency Use Authorization (EUA). This EUA will remain  in effect (meaning this test can be used) for the duration  of the COVID-19 declaration under Section 564(b)(1) of the Act, 21 U.S.C.section 360bbb-3(b)(1), unless the authorization is terminated  or revoked sooner.       Influenza A by PCR NEGATIVE NEGATIVE Final   Influenza B by PCR NEGATIVE NEGATIVE Final    Comment: (NOTE) The Xpert Xpress SARS-CoV-2/FLU/RSV plus assay is intended as an aid in the diagnosis of influenza from Nasopharyngeal swab specimens and should not be used as a sole basis for treatment. Nasal washings and aspirates are unacceptable for Xpert Xpress SARS-CoV-2/FLU/RSV testing.  Fact Sheet for Patients: EntrepreneurPulse.com.au  Fact Sheet for Healthcare Providers: IncredibleEmployment.be  This test is not yet approved or cleared by the [place] FDA and has been authorized for detection and/or diagnosis of SARS-CoV-2 by FDA under an Emergency Use Authorization (EUA). This EUA will remain in effect (meaning this test can be used) for the duration of the COVID-19 declaration under Section 564(b)(1) of the Act, 21 U.S.C. section 360bbb-3(b)(1), unless the authorization is terminated or revoked.  Performed at [facility], [place] [address], [place], [place] [address]      Radiology Studies: No results found.   Scheduled Meds:  apixaban  5 mg Oral BID   arformoterol  15 mcg Nebulization BID   And   umeclidinium bromide  1 puff Inhalation Daily   budesonide (PULMICORT) nebulizer solution  0.25 mg Nebulization BID   buPROPion  150 mg Oral Daily   insulin aspart  0-15 Units Subcutaneous TID WC   insulin glargine-yfgn  20 Units Subcutaneous Daily   ipratropium-albuterol  3 mL Nebulization BID   levothyroxine  75 mcg Oral QAC breakfast   metoprolol succinate  12.5 mg Oral Daily   nicotine  14 mg Transdermal Daily   polyethylene glycol  17 g Oral Daily   pravastatin  40 mg Oral q1800   sodium chloride flush  3 mL Intravenous Q12H   Continuous Infusions:  sodium  chloride       LOS: 2 days    Time spent: 45mn  PDomenic Polite MD [facility]   12/31/2021, 11:41 AM

## 2021-12-31 NOTE — Progress Notes (Signed)
Cardiology Rounding Note  PCP-Cardiologist: [name], MD   Subjective:    Net negative 490 cc yesterday, -2.5 L on admission.  BP 117/45 this morning.  Weight down 1 pound from yesterday.  On 7 L [place] this morning.  Creatinine remained stable at 1.3.  Reports dyspnea is improving   Objective:   Weight Range: 75.4 kg Body mass index is 26.83 kg/m.   Vital Signs:   Temp:  [97.4 F (36.3 C)-98 F (36.7 C)] 97.6 F (36.4 C) (01/21 0748) Pulse Rate:  [55-83] 72 (01/21 0900) Resp:  [14-20] 19 (01/21 0748) BP: (107-124)/(44-66) 117/45 (01/21 1120) SpO2:  [90 %-98 %] 92 % (01/21 0844) Weight:  [75.4 kg] 75.4 kg (01/21 0506) Last BM Date: 12/27/21  Weight change: Filed Weights   12/29/21 0513 12/30/21 0330 12/31/21 0506  Weight: 75.1 kg 75.7 kg 75.4 kg    Intake/Output:   Intake/Output Summary (Last 24 hours) at 12/31/2021 1128 Last data filed at 12/31/2021 0748 Gross per 24 hour  Intake 960 ml  Output 1700 ml  Net -740 ml       Physical Exam    General:  SOB, dyspneic w/ conversation, on HFNC  HEENT: Normal Neck: Supple. JVP elevated Cor: Regular rate & rhythm. No rubs, gallops or murmurs. Lungs: bilateral crackles (ILD)  Abdomen: Soft, nontender, nondistended. Extremities: No cyanosis, clubbing, rash, edema Neuro: Alert & orientedx3   Telemetry   NSR 70s   EKG    No new EKG to review   Labs    CBC Recent Labs    12/28/21 1217 12/29/21 0331 12/30/21 0320  WBC 8.7 6.1 13.1*  NEUTROABS 6.6  --   --   HGB 14.3 14.0 14.3  HCT 46.0 41.9 43.5  MCV 106.2* 102.2* 102.6*  PLT 160 132* 145*    Basic Metabolic Panel Recent Labs    12/30/21 0320 12/31/21 0915  NA 136 139  K 4.1 4.3  CL 104 105  CO2 24 26  GLUCOSE 176* 192*  BUN 31* 31*  CREATININE 1.30* 1.26*  CALCIUM 8.7* 8.9  MG 2.3  --     Liver Function Tests Recent Labs    12/28/21 1315  AST 38  ALT 21  ALKPHOS 54  BILITOT 1.4*  PROT 7.5  ALBUMIN 3.3*    No  results for input(s): LIPASE, AMYLASE in the last 72 hours. Cardiac Enzymes No results for input(s): CKTOTAL, CKMB, CKMBINDEX, TROPONINI in the last 72 hours.  BNP: BNP (last 3 results) Recent Labs    11/18/21 1610 12/28/21 1218 12/29/21 0331  BNP 241.9* 565.2* 821.0*     ProBNP (last 3 results) No results for input(s): PROBNP in the last 8760 hours.   D-Dimer No results for input(s): DDIMER in the last 72 hours. Hemoglobin A1C No results for input(s): HGBA1C in the last 72 hours. Fasting Lipid Panel No results for input(s): CHOL, HDL, LDLCALC, TRIG, CHOLHDL, LDLDIRECT in the last 72 hours. Thyroid Function Tests No results for input(s): TSH, T4TOTAL, T3FREE, THYROIDAB in the last 72 hours.  Invalid input(s): FREET3  Other results:   Imaging    No results found.   Medications:     Scheduled Medications:  apixaban  5 mg Oral BID   arformoterol  15 mcg Nebulization BID   And   umeclidinium bromide  1 puff Inhalation Daily   budesonide (PULMICORT) nebulizer solution  0.25 mg Nebulization BID   buPROPion  150 mg Oral Daily   insulin  aspart  0-15 Units Subcutaneous TID WC   insulin glargine-yfgn  20 Units Subcutaneous Daily   ipratropium-albuterol  3 mL Nebulization BID   levothyroxine  75 mcg Oral QAC breakfast   metoprolol succinate  12.5 mg Oral Daily   nicotine  14 mg Transdermal Daily   polyethylene glycol  17 g Oral Daily   pravastatin  40 mg Oral q1800   sodium chloride flush  3 mL Intravenous Q12H    Infusions:  sodium chloride      PRN Medications: sodium chloride, acetaminophen **OR** acetaminophen, albuterol, guaiFENesin-dextromethorphan, sodium chloride flush    Patient Profile   Mr Elizabeth Melton is a 81 year old with a history of PE, PAF, ILD, COPD, and diastolic CHF with RV failure, and HFpEF.    Admitted with A/C respiratory failure + A/C HFpEF  Assessment/Plan   1. A/C Hypoxic Respiratory Failure - On 5 liters at home. Placed on Bipap  in the ED and had weaned back to 5 liters. - Today, higher O2 requirements, 7L HFNC  - Multifactorial (ILD, PAH and A/c CHF) - Continue IV Lasix today for volume overload  - c/w nebs/ bronchodilators    2. A/C HFpEF---> RV Failure Echo in 11/22 showed EF 60-65%, mildly decreased RV systolic function with mild RV enlargement, mild MR, moderate TR, PASP 85 mmHg, mild aortic stenosis and mild aortic insufficiency, IVC dilated. The RV failure may be primarily due to pulmonary hypertension. RHC in 12/22 showed normal filling pressures with severe PH. - Volume overloaded in setting of poor compliance w/ home diuretic regimen - Renal function stable.  - Continue IV Lasix 60 mg bid    3. PAH -Pulmonary venous hypertension on 7/22 RHC.  However, last echo in 11/22 showed PASP up to 85 mmHg. No evidence for chronic PE on 9/22 V/Q scan.  RHC in 12/22 showed normal filling pressures with severe pulmonary arterial hypertension.  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    - With PH-ILD, a trial of Tyvaso-DPI planned. Tyvaso application pending.    4. PAF  -Maintaining SR.  -On eliquis 5 mg twice a day.    5. ILD - followed by Dr. [name]  - seen by pulmonary here, doubt acute flare. IV methylprednisolone stopped    6. DMII -On SSI   7. CKD Stage IIIa -Creatinine 1.3>1.3>1.30  -Daily BMET    8.   Hypothyroidism -On Levothyroxine.    9.Tobacco Abuse -Last smoked 2 weeks ago.   10. Leukocytosis  - WBC 6.1>>13.1. Afebrile - Suspect 2/2 recent steroids  - Given higher O2 requirements, checked PCT to r/o PNA, was negative   Length of Stay: 2  [name], MD  12/31/2021, 11:28 AM

## 2022-01-01 ENCOUNTER — Telehealth: Payer: Self-pay | Admitting: Student

## 2022-01-01 LAB — GLUCOSE, CAPILLARY
Glucose-Capillary: 142 mg/dL — ABNORMAL HIGH (ref 70–99)
Glucose-Capillary: 176 mg/dL — ABNORMAL HIGH (ref 70–99)
Glucose-Capillary: 143 mg/dL — ABNORMAL HIGH (ref 70–99)
Glucose-Capillary: 281 mg/dL — ABNORMAL HIGH (ref 70–99)

## 2022-01-01 LAB — BASIC METABOLIC PANEL
Anion gap: 8 (ref 5–15)
BUN: 29 mg/dL — ABNORMAL HIGH (ref 8–23)
CO2: 28 mmol/L (ref 22–32)
Calcium: 8.7 mg/dL — ABNORMAL LOW (ref 8.9–10.3)
Chloride: 102 mmol/L (ref 98–111)
Creatinine, Ser: 1.16 mg/dL — ABNORMAL HIGH (ref 0.44–1.00)
GFR, Estimated: 48 mL/min — ABNORMAL LOW (ref >60)
Glucose, Bld: 127 mg/dL — ABNORMAL HIGH (ref 70–99)
Potassium: 4.3 mmol/L (ref 3.5–5.1)
Sodium: 138 mmol/L (ref 135–145)

## 2022-01-01 LAB — MAGNESIUM: Magnesium: 2.7 mg/dL — ABNORMAL HIGH (ref 1.7–2.4)

## 2022-01-01 MED ORDER — FUROSEMIDE 10 MG/ML IJ SOLN
40.0000 mg | Freq: Two times a day (BID) | INTRAMUSCULAR | Status: DC
  Administered 2022-01-01 (×2): 40 mg via INTRAVENOUS
  Filled 2022-01-01 (×2): qty 4

## 2022-01-01 MED ORDER — LIDOCAINE 5 % EX PTCH
1.0000 | MEDICATED_PATCH | Freq: Every day | CUTANEOUS | Status: DC
  Administered 2022-01-01 – 2022-01-02 (×2): 1 via TRANSDERMAL
  Filled 2022-01-01 (×2): qty 1

## 2022-01-01 MED ORDER — BISACODYL 10 MG RE SUPP
10.0000 mg | Freq: Once | RECTAL | Status: AC
  Administered 2022-01-01: 10 mg via RECTAL
  Filled 2022-01-01: qty 1

## 2022-01-01 NOTE — Progress Notes (Signed)
PROGRESS NOTE    Elizabeth Melton  ZJQ:[id number] DOB: 27-Dec-1940 DOA: 12/28/2021 PCP: Kathyrn Lass, MD  Brief Narrative: 80/F with history of chronic respiratory failure, COPD and interstitial lung disease on 5 L O2, severe pulmonary hypertension, chronic diastolic CHF, RV failure, type 2 diabetes mellitus, hypertension, paroxysmal atrial fibrillation, history of pulmonary embolism on Eliquis, PAD presented to the ED with acute worsening shortness of breath since yesterday.  When EMS arrived her O2 sats were 85% on 5 L, she was placed on BiPAP and transferred to the ED, reported missing some of her diuretic doses in the last few days. In the ED placed on BiPAP for respiratory distress, chest x-ray noted chronic findings, labs were unremarkable   Subjective: -Short of breath this morning, required BiPAP  Assessment & Plan:  Acute on chronic hypoxic respiratory failure, on 5L O2 at home 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 Severe PAH -Recent right heart cath 12/14 noted normal filling pressures, severe PAH and with low cardiac output, plan  to start Tyvaso as outpt -Patient admits to missing/taking less diuretics recently -Echo: 10/2021-showed EF 60-65%, mildly decreased RV systolic function with mild RV enlargement, mild MR, moderate TR, PASP 85 mmHg -Cards following, mild clinical improvement, negative 3.9 L, continue IV Lasix 1 more day -Appreciate pulmonary input as well, ILD progression felt to be less likely, steroids discontinued -Started BiPAP nightly, TOC consulted for trilogy -Palliative care consulted for goals of care, patient and daughter want aggressive management -Increase activity, remains dyspneic in bed with minimal activity -Wean O2, dropped down to 6 L this morning -discharge planning   CKD (chronic kidney disease) stage IIIa -Stable, monitor with diuresis  Hyperkalemia- (present on admission) -Resolved  AF (paroxysmal atrial  fibrillation) ([place])- (present on admission) -In sinus rhythm, continue toprol-xl and eliquis   COPD ILD (interstitial lung disease) ([place])- (present on admission) Followed by pulmonology and recently seen this month 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  Considering anti-fibrotic therapy  -Wean O2 as tolerated  Tobacco abuse- (present on admission) Continue nicotine patch   Essential (primary) hypertension- (present on admission) Blood pressure mildly elevated Continue home medication: toprol-xl 12.58m daily   Type 2 diabetes mellitus with hyperglycemia [place]- (present on admission) a1c in 10/2021: 7.6 -Continue Lantus, CBGs are stable now  Hyperlipidemia/PAD- (present on admission) Continue mevacor  Hypothyroidism- (present on admission) Last TSH 05/2021 and wnl Continue home synthroid  DVT prophylaxis: Eliquis Code Status: Full code Family Communication: Updated daughter late last week Disposition Plan: Home pending improvement in respiratory status, hopefully Monday or Tuesday inPatient  Consultants:  Cardiology Pulmonary  Procedures:   Antimicrobials:    Objective: Vitals:   01/01/22 0813 01/01/22 0815 01/01/22 0837 01/01/22 1058  BP:   (!) 132/49 125/67  Pulse:   97 74  Resp:   16 (!) 23  Temp:   97.6 F (36.4 C) 98 F (36.7 C)  TempSrc:   Oral Oral  SpO2: (!) 85% (!) 89% (!) 87% (!) 88%  Weight:      Height:        Intake/Output Summary (Last 24 hours) at 01/01/2022 1109 Last data filed at 01/01/2022 0544 Gross per 24 hour  Intake 960 ml  Output 2000 ml  Net -1040 ml   Filed Weights   12/30/21 0330 12/31/21 0506 01/01/22 0533  Weight: 75.7 kg 75.4 kg 74.9 kg    Examination:  Chronically ill female, sitting up in bed, AAOx3, mild respiratory distress  HEENT: Unable to assess JVD CVS: S1-S2, regular rate rhythm Lungs: Bilateral Rales in lower lungs Abdomen: Soft, nontender, bowel sounds present Extremities: No edema Skin: No  rashes Psychiatry: Judgement and insight appear normal. Mood & affect appropriate.     Data Reviewed:   CBC: Recent Labs  Lab 12/28/21 1217 12/29/21 0331 12/30/21 0320  WBC 8.7 6.1 13.1*  NEUTROABS 6.6  --   --   HGB 14.3 14.0 14.3  HCT 46.0 41.9 43.5  MCV 106.2* 102.2* 102.6*  PLT 160 132* 850*   Basic Metabolic Panel: Recent Labs  Lab 12/28/21 1315 12/29/21 0331 12/30/21 0320 12/31/21 0915 01/01/22 0150  NA 140 137 136 139 138  K 5.2* 4.4 4.1 4.3 4.3  CL 104 107 104 105 102  CO2 25 21* _0 GLUCOSE 129* 292* 176* 192* 127*  BUN 28* 30* 31* 31* 29*  CREATININE 1.26* 1.31* 1.30* 1.26* 1.16*  CALCIUM 9.6 8.4* 8.7* 8.9 8.7*  MG  --   --  2.3  --  2.7*   GFR: Estimated Creatinine Clearance: 40.7 mL/min (A) (by C-G formula based on SCr of 1.16 mg/dL (H)). Liver Function Tests: Recent Labs  Lab 12/28/21 1315  AST 38  ALT 21  ALKPHOS 54  BILITOT 1.4*  PROT 7.5  ALBUMIN 3.3*   No results for input(s): LIPASE, AMYLASE in the last 168 hours. No results for input(s): AMMONIA in the last 168 hours. Coagulation Profile: No results for input(s): INR, PROTIME in the last 168 hours. Cardiac Enzymes: No results for input(s): CKTOTAL, CKMB, CKMBINDEX, TROPONINI in the last 168 hours. BNP (last 3 results) No results for input(s): PROBNP in the last 8760 hours. HbA1C: No results for input(s): HGBA1C in the last 72 hours. CBG: Recent Labs  Lab 12/31/21 1156 12/31/21 1541 12/31/21 2134 01/01/22 0636 01/01/22 1056  GLUCAP 156* 158* 123* 142* 176*   Lipid Profile: No results for input(s): CHOL, HDL, LDLCALC, TRIG, CHOLHDL, LDLDIRECT in the last 72 hours. Thyroid Function Tests: No results for input(s): TSH, T4TOTAL, FREET4, T3FREE, THYROIDAB in the last 72 hours. Anemia Panel: No results for input(s): VITAMINB12, FOLATE, FERRITIN, TIBC, IRON, RETICCTPCT in the last 72 hours. Urine analysis:    Component Value Date/Time   COLORURINE YELLOW 12/28/2021 1510    APPEARANCEUR CLEAR 12/28/2021 1510   APPEARANCEUR Clear 09/20/2021 1449   LABSPEC 1.010 12/28/2021 1510   PHURINE 6.0 12/28/2021 1510   GLUCOSEU NEGATIVE 12/28/2021 1510   HGBUR NEGATIVE 12/28/2021 [place] 12/28/2021 1510   BILIRUBINUR Negative 09/20/2021 [place] 12/28/2021 1510   PROTEINUR NEGATIVE 12/28/2021 1510   NITRITE NEGATIVE 12/28/2021 [place] 12/28/2021 1510   Sepsis Labs: _1 (procalcitonin:4,lacticidven:4)  ) Recent Results (from the past 240 hour(s))  Resp Panel by RT-PCR (Flu A&B, Covid) Nasopharyngeal Swab     Status: None   Collection Time: 12/28/21  3:50 PM   Specimen: Nasopharyngeal Swab; Nasopharyngeal(NP) swabs in vial transport medium  Result Value Ref Range Status   SARS Coronavirus 2 by RT PCR NEGATIVE NEGATIVE Final    Comment: (NOTE) SARS-CoV-2 target nucleic acids are NOT DETECTED.  The SARS-CoV-2 RNA is generally detectable in upper respiratory specimens during the acute phase of infection. The lowest concentration of SARS-CoV-2 viral copies this assay can detect is 138 copies/mL. A negative result does not preclude SARS-Cov-2 infection and should not be used as the sole basis for treatment or other patient management decisions. A negative result  may occur with  improper specimen collection/handling, submission of specimen other than nasopharyngeal swab, presence of viral mutation(s) within the areas targeted by this assay, and inadequate number of viral copies(<138 copies/mL). A negative result must be combined with clinical observations, patient history, and epidemiological information. The expected result is Negative.  Fact Sheet for Patients:  EntrepreneurPulse.com.au  Fact Sheet for Healthcare Providers:  IncredibleEmployment.be  This test is no t yet approved or cleared by the [place] FDA and  has been authorized for detection  and/or diagnosis of SARS-CoV-2 by FDA under an Emergency Use Authorization (EUA). This EUA will remain  in effect (meaning this test can be used) for the duration of the COVID-19 declaration under Section 564(b)(1) of the Act, 21 U.S.C.section 360bbb-3(b)(1), unless the authorization is terminated  or revoked sooner.       Influenza A by PCR NEGATIVE NEGATIVE Final   Influenza B by PCR NEGATIVE NEGATIVE Final    Comment: (NOTE) The Xpert Xpress SARS-CoV-2/FLU/RSV plus assay is intended as an aid in the diagnosis of influenza from Nasopharyngeal swab specimens and should not be used as a sole basis for treatment. Nasal washings and aspirates are unacceptable for Xpert Xpress SARS-CoV-2/FLU/RSV testing.  Fact Sheet for Patients: EntrepreneurPulse.com.au  Fact Sheet for Healthcare Providers: IncredibleEmployment.be  This test is not yet approved or cleared by the [place] FDA and has been authorized for detection and/or diagnosis of SARS-CoV-2 by FDA under an Emergency Use Authorization (EUA). This EUA will remain in effect (meaning this test can be used) for the duration of the COVID-19 declaration under Section 564(b)(1) of the Act, 21 U.S.C. section 360bbb-3(b)(1), unless the authorization is terminated or revoked.  Performed at [facility], [place] [address] [place], [place] [address]      Radiology Studies: No results found.   Scheduled Meds:  apixaban  5 mg Oral BID   arformoterol  15 mcg Nebulization BID   And   umeclidinium bromide  1 puff Inhalation Daily   budesonide (PULMICORT) nebulizer solution  0.25 mg Nebulization BID   buPROPion  150 mg Oral Daily   furosemide  40 mg Intravenous BID   guaiFENesin  600 mg Oral BID   insulin aspart  0-15 Units Subcutaneous TID WC   insulin glargine-yfgn  20 Units Subcutaneous Daily   ipratropium-albuterol  3 mL Nebulization BID   levothyroxine  75 mcg Oral QAC breakfast    metoprolol succinate  12.5 mg Oral Daily   nicotine  14 mg Transdermal Daily   polyethylene glycol  17 g Oral Daily   pravastatin  40 mg Oral q1800   sodium chloride flush  3 mL Intravenous Q12H   Continuous Infusions:  sodium chloride       LOS: 3 days    Time spent: 28mn  PDomenic Polite MD [facility]   01/01/2022, 11:09 AM

## 2022-01-01 NOTE — Plan of Care (Signed)

## 2022-01-01 NOTE — Plan of Care (Signed)
Problem: Education: Goal: Ability to demonstrate management of disease process will improve Outcome: Progressing   Problem: Activity: Goal: Capacity to carry out activities will improve Outcome: Progressing   Problem: Health Behavior/Discharge Planning: Goal: Ability to manage health-related needs will improve Outcome: Progressing

## 2022-01-01 NOTE — Progress Notes (Signed)
Mobility Specialist: Progress Note   01/01/22 1143  Mobility  Activity Ambulated with assistance in hallway  Level of Assistance Modified independent, requires aide device or extra time  Assistive Device Front wheel walker  Distance Ambulated (ft) 230 ft  Activity Response Tolerated fair  $Mobility charge 1 Mobility   Pre-Mobility 10 L/min [place]: 76 HR, 94% SpO2 During Mobility 10 L/min [place]: 85% SpO2 Post-Mobility 7 L/min HFNC: 82 HR, 93% SpO2  Pt ambulated on 10 L/min [place] requiring x2 standing breaks d/t desat to 85%. Pt coached through pursed lip breathing with sats returning to >90%. Pt to recliner after walk with call bell and phone at her side.  [facility] [place] Mobility Specialist Mobility Specialist 4 [place]: [phone] Mobility Specialist 2 [place] and [place]: [phone]

## 2022-01-01 NOTE — Progress Notes (Signed)
PCCM Brief Progress Note  # Acute on chronic hypoxic respiratory failure # RV failure/severe PH # IPF/CPFE - diuretics/vasodilators per advanced heart failure - walking oximetry before discharge - will arrange pulmonary clinic follow up for her  Will sign off but glad to be reinvolved as condition changes  [place]

## 2022-01-01 NOTE — Telephone Encounter (Signed)
Can we arrange follow up with Dr. [name] or APP within next 2 weeks or so?  Thanks!

## 2022-01-01 NOTE — Progress Notes (Signed)
Cardiology Rounding Note  PCP-Cardiologist: [name], MD   Subjective:    Net negative 1.5 cc yesterday, -4 L on admission.  BP 125/67 this morning.  Weight down 1 pound from yesterday.  On 7 L [place] this morning.  Creatinine remained stable at 1.2.  Reports dyspnea is improving   Objective:   Weight Range: 74.9 kg Body mass index is 26.65 kg/m.   Vital Signs:   Temp:  [97.3 F (36.3 C)-98 F (36.7 C)] 98 F (36.7 C) (01/22 1058) Pulse Rate:  [58-97] 74 (01/22 1058) Resp:  [16-23] 23 (01/22 1058) BP: (114-136)/(49-67) 125/67 (01/22 1058) SpO2:  [85 %-98 %] 88 % (01/22 1058) FiO2 (%):  [48 %] 48 % (01/22 0815) Weight:  [74.9 kg] 74.9 kg (01/22 0533) Last BM Date: 12/31/21  Weight change: Filed Weights   12/30/21 0330 12/31/21 0506 01/01/22 0533  Weight: 75.7 kg 75.4 kg 74.9 kg    Intake/Output:   Intake/Output Summary (Last 24 hours) at 01/01/2022 1139 Last data filed at 01/01/2022 0544 Gross per 24 hour  Intake 960 ml  Output 2000 ml  Net -1040 ml       Physical Exam    General:  SOB, dyspneic w/ conversation, on HFNC  HEENT: Normal Neck: Supple. JVP elevated Cor: Regular rate & rhythm. No rubs, gallops or murmurs. Lungs: bilateral crackles (ILD)  Abdomen: Soft, nontender, nondistended. Extremities: No cyanosis, clubbing, rash, edema Neuro: Alert & orientedx3   Telemetry   NSR 70s   EKG    No new EKG to review   Labs    CBC Recent Labs    12/30/21 0320  WBC 13.1*  HGB 14.3  HCT 43.5  MCV 102.6*  PLT 145*    Basic Metabolic Panel Recent Labs    12/30/21 0320 12/31/21 0915 01/01/22 0150  NA 136 139 138  K 4.1 4.3 4.3  CL 104 105 102  CO2 _0 GLUCOSE 176* 192* 127*  BUN 31* 31* 29*  CREATININE 1.30* 1.26* 1.16*  CALCIUM 8.7* 8.9 8.7*  MG 2.3  --  2.7*    Liver Function Tests No results for input(s): AST, ALT, ALKPHOS, BILITOT, PROT, ALBUMIN in the last 72 hours.  No results for input(s): LIPASE,  AMYLASE in the last 72 hours. Cardiac Enzymes No results for input(s): CKTOTAL, CKMB, CKMBINDEX, TROPONINI in the last 72 hours.  BNP: BNP (last 3 results) Recent Labs    11/18/21 1610 12/28/21 1218 12/29/21 0331  BNP 241.9* 565.2* 821.0*     ProBNP (last 3 results) No results for input(s): PROBNP in the last 8760 hours.   D-Dimer No results for input(s): DDIMER in the last 72 hours. Hemoglobin A1C No results for input(s): HGBA1C in the last 72 hours. Fasting Lipid Panel No results for input(s): CHOL, HDL, LDLCALC, TRIG, CHOLHDL, LDLDIRECT in the last 72 hours. Thyroid Function Tests No results for input(s): TSH, T4TOTAL, T3FREE, THYROIDAB in the last 72 hours.  Invalid input(s): FREET3  Other results:   Imaging    No results found.   Medications:     Scheduled Medications:  apixaban  5 mg Oral BID   arformoterol  15 mcg Nebulization BID   And   umeclidinium bromide  1 puff Inhalation Daily   budesonide (PULMICORT) nebulizer solution  0.25 mg Nebulization BID   buPROPion  150 mg Oral Daily   furosemide  40 mg Intravenous BID   guaiFENesin  600 mg Oral BID  insulin aspart  0-15 Units Subcutaneous TID WC   insulin glargine-yfgn  20 Units Subcutaneous Daily   ipratropium-albuterol  3 mL Nebulization BID   levothyroxine  75 mcg Oral QAC breakfast   metoprolol succinate  12.5 mg Oral Daily   nicotine  14 mg Transdermal Daily   polyethylene glycol  17 g Oral Daily   pravastatin  40 mg Oral q1800   sodium chloride flush  3 mL Intravenous Q12H    Infusions:  sodium chloride      PRN Medications: sodium chloride, acetaminophen **OR** acetaminophen, albuterol, guaiFENesin-dextromethorphan, sodium chloride flush    Patient Profile   Mr Elizabeth Melton is a 81 year old with a history of PE, PAF, ILD, COPD, and diastolic CHF with RV failure, and HFpEF.    Admitted with A/C respiratory failure + A/C HFpEF  Assessment/Plan   1. A/C Hypoxic Respiratory  Failure - On 5 liters at home. Placed on Bipap in the ED and had weaned back to 5 liters. - Today, higher O2 requirements, 7L HFNC  - Multifactorial (ILD, PAH and A/c CHF) - Continue IV Lasix today for volume overload, improving may be able to switch to PO torsemide tomorrow  - c/w nebs/ bronchodilators    2. A/C HFpEF---> RV Failure Echo in 11/22 showed EF 60-65%, mildly decreased RV systolic function with mild RV enlargement, mild MR, moderate TR, PASP 85 mmHg, mild aortic stenosis and mild aortic insufficiency, IVC dilated. The RV failure may be primarily due to pulmonary hypertension. RHC in 12/22 showed normal filling pressures with severe PH. - Volume overloaded in setting of poor compliance w/ home diuretic regimen - Renal function stable.  - Continue IV Lasix 60 mg bid    3. PAH -Pulmonary venous hypertension on 7/22 RHC.  However, last echo in 11/22 showed PASP up to 85 mmHg. No evidence for chronic PE on 9/22 V/Q scan.  RHC in 12/22 showed normal filling pressures with severe pulmonary arterial hypertension.  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    - With PH-ILD, a trial of Tyvaso-DPI planned. Tyvaso application pending.    4. PAF  -Maintaining SR.  -On eliquis 5 mg twice a day.    5. ILD - followed by Dr. [name]  - seen by pulmonary here, doubt acute flare. IV methylprednisolone stopped    6. DMII -On SSI   7. CKD Stage IIIa -Creatinine 1.3>1.3>1.3>1.2 -Daily BMET    8.   Hypothyroidism -On Levothyroxine.    9.Tobacco Abuse -Last smoked 2 weeks ago.   10. Leukocytosis  - WBC 6.1>>13.1. Afebrile - Suspect 2/2 recent steroids  - Given higher O2 requirements, checked PCT to r/o PNA, was negative   Length of Stay: 3  [name], MD  01/01/2022, 11:39 AM

## 2022-01-02 LAB — BASIC METABOLIC PANEL
Anion gap: 8 (ref 5–15)
BUN: 35 mg/dL — ABNORMAL HIGH (ref 8–23)
CO2: 24 mmol/L (ref 22–32)
Calcium: 8.3 mg/dL — ABNORMAL LOW (ref 8.9–10.3)
Chloride: 101 mmol/L (ref 98–111)
Creatinine, Ser: 1.28 mg/dL — ABNORMAL HIGH (ref 0.44–1.00)
GFR, Estimated: 43 mL/min — ABNORMAL LOW (ref >60)
Glucose, Bld: 202 mg/dL — ABNORMAL HIGH (ref 70–99)
Potassium: 4.3 mmol/L (ref 3.5–5.1)
Sodium: 133 mmol/L — ABNORMAL LOW (ref 135–145)

## 2022-01-02 LAB — CBC
HCT: 48.6 % — ABNORMAL HIGH (ref 36.0–46.0)
Hemoglobin: 15.6 g/dL — ABNORMAL HIGH (ref 12.0–15.0)
MCH: 33 pg (ref 26.0–34.0)
MCHC: 32.1 g/dL (ref 30.0–36.0)
MCV: 102.7 fL — ABNORMAL HIGH (ref 80.0–100.0)
Platelets: 155 10*3/uL (ref 150–400)
RBC: 4.73 MIL/uL (ref 3.87–5.11)
RDW: 17.2 % — ABNORMAL HIGH (ref 11.5–15.5)
WBC: 8.9 10*3/uL (ref 4.0–10.5)
nRBC: 0 % (ref 0.0–0.2)

## 2022-01-02 LAB — GLUCOSE, CAPILLARY
Glucose-Capillary: 131 mg/dL — ABNORMAL HIGH (ref 70–99)
Glucose-Capillary: 96 mg/dL (ref 70–99)
Glucose-Capillary: 116 mg/dL — ABNORMAL HIGH (ref 70–99)
Glucose-Capillary: 124 mg/dL — ABNORMAL HIGH (ref 70–99)

## 2022-01-02 MED ORDER — TORSEMIDE 20 MG PO TABS
40.0000 mg | ORAL_TABLET | Freq: Two times a day (BID) | ORAL | Status: DC
  Administered 2022-01-02 – 2022-01-05 (×7): 40 mg via ORAL
  Filled 2022-01-02 (×7): qty 2

## 2022-01-02 MED ORDER — METHOCARBAMOL 500 MG PO TABS
750.0000 mg | ORAL_TABLET | Freq: Two times a day (BID) | ORAL | Status: DC
  Administered 2022-01-02 – 2022-01-03 (×3): 750 mg via ORAL
  Filled 2022-01-02 (×3): qty 2

## 2022-01-02 MED ORDER — DICLOFENAC SODIUM 1 % EX GEL
2.0000 g | Freq: Three times a day (TID) | CUTANEOUS | Status: DC
  Administered 2022-01-02 – 2022-01-13 (×28): 2 g via TOPICAL
  Filled 2022-01-02: qty 100

## 2022-01-02 NOTE — Progress Notes (Signed)
Pt noted to have removed lidocaine patch. Stated it was cold and she didn't want anything cold. Disposed of patch at this time.

## 2022-01-02 NOTE — Progress Notes (Signed)
Patient not ready for BIPAP at this time. Requests 1:00. No distress noted.

## 2022-01-02 NOTE — Telephone Encounter (Signed)
The patient has an office visit on 01/20/22. Nothing further needed.

## 2022-01-02 NOTE — Progress Notes (Signed)
[facility]) Hospital Liaison note:  This patient is currently enrolled in [facility] outpatient-based Palliative Care. Will continue to follow for disposition.  Please call with any outpatient palliative questions or concerns.  Thank you, Lorelee Market, LPN [facility] Liaison [phone]

## 2022-01-02 NOTE — Progress Notes (Signed)
Pt c/o back pain. Describes it as a cramping or spasm type pain. Tylenol given and MD on call paged via [place].

## 2022-01-02 NOTE — TOC Benefit Eligibility Note (Signed)
Transition of Care [facility]) Benefit Eligibility Note    Patient Details  Name: Elizabeth Melton MRN: [id number] Date of Birth: [birth year]   Medication/Dose: ELIQUIS  5 MG BID  Covered?: Yes  Tier: 3 Drug  Prescription Coverage [facility]  Spoke with Person/Company/Phone Number:: JASMINE  @ [facility] [employer] #  [phone] [employer]- MEMBER  Co-Pay: $ 45.00  Prior Approval: No  Deductible: Unmet (OUT-OF-POCKET:UNMET)       [place] Phone Number: 01/02/2022, 3:19 PM

## 2022-01-02 NOTE — Progress Notes (Addendum)
Cardiology Rounding Note  PCP-Cardiologist: [name], MD   Subjective:    Yesterday diuresed with IV lasix. I/O not accurate but recorded output >1600 cc.   Creatinine trending up 1.16>1.3.    Complaining of back pain. Says every times she moves her back hurts.  Objective:   Weight Range: 76.1 kg Body mass index is 27.08 kg/m.   Vital Signs:   Temp:  [97.5 F (36.4 C)-98.2 F (36.8 C)] 98 F (36.7 C) (01/23 0722) Pulse Rate:  [61-97] 67 (01/23 0722) Resp:  [15-23] 15 (01/23 0722) BP: (123-147)/(47-99) 125/54 (01/23 0722) SpO2:  [85 %-96 %] 95 % (01/23 0722) FiO2 (%):  [40 %-48 %] 40 % (01/23 0335) Weight:  [76.1 kg] 76.1 kg (01/23 0623) Last BM Date: 12/31/21  Weight change: Filed Weights   12/31/21 0506 01/01/22 0533 01/02/22 0623  Weight: 75.4 kg 74.9 kg 76.1 kg    Intake/Output:   Intake/Output Summary (Last 24 hours) at 01/02/2022 0752 Last data filed at 01/02/2022 7494 Gross per 24 hour  Intake 1440 ml  Output 1650 ml  Net -210 ml      Physical Exam   General:  Moaning.  No resp difficulty HEENT: normal Neck: supple. no JVD. Carotids 2+ bilat; no bruits. No lymphadenopathy or thryomegaly appreciated. Cor: PMI nondisplaced. Regular rate & rhythm. No rubs, gallops or murmurs. Lungs: crackles in the bases. On 8 liters HFNC Abdomen: soft, nontender, nondistended. No hepatosplenomegaly. No bruits or masses. Good bowel sounds. Extremities: no cyanosis, clubbing, rash, edema Neuro: alert & orientedx3, cranial nerves grossly intact. moves all 4 extremities w/o difficulty. Affect pleasant  Telemetry   SR 60-70s personally checked.   EKG    No new EKG to review   Labs    CBC Recent Labs    01/02/22 0422  WBC 8.9  HGB 15.6*  HCT 48.6*  MCV 102.7*  PLT 496   Basic Metabolic Panel Recent Labs    01/01/22 0150 01/02/22 0422  NA 138 133*  K 4.3 4.3  CL 102 101  CO2 28 24  GLUCOSE 127* 202*  BUN 29* 35*  CREATININE 1.16*  1.28*  CALCIUM 8.7* 8.3*  MG 2.7*  --    Liver Function Tests No results for input(s): AST, ALT, ALKPHOS, BILITOT, PROT, ALBUMIN in the last 72 hours.  No results for input(s): LIPASE, AMYLASE in the last 72 hours. Cardiac Enzymes No results for input(s): CKTOTAL, CKMB, CKMBINDEX, TROPONINI in the last 72 hours.  BNP: BNP (last 3 results) Recent Labs    11/18/21 1610 12/28/21 1218 12/29/21 0331  BNP 241.9* 565.2* 821.0*    ProBNP (last 3 results) No results for input(s): PROBNP in the last 8760 hours.   D-Dimer No results for input(s): DDIMER in the last 72 hours. Hemoglobin A1C No results for input(s): HGBA1C in the last 72 hours. Fasting Lipid Panel No results for input(s): CHOL, HDL, LDLCALC, TRIG, CHOLHDL, LDLDIRECT in the last 72 hours. Thyroid Function Tests No results for input(s): TSH, T4TOTAL, T3FREE, THYROIDAB in the last 72 hours.  Invalid input(s): FREET3  Other results:   Imaging    No results found.   Medications:     Scheduled Medications:  apixaban  5 mg Oral BID   arformoterol  15 mcg Nebulization BID   And   umeclidinium bromide  1 puff Inhalation Daily   budesonide (PULMICORT) nebulizer solution  0.25 mg Nebulization BID   buPROPion  150 mg Oral Daily   furosemide  40 mg Intravenous BID   guaiFENesin  600 mg Oral BID   insulin aspart  0-15 Units Subcutaneous TID WC   insulin glargine-yfgn  20 Units Subcutaneous Daily   ipratropium-albuterol  3 mL Nebulization BID   levothyroxine  75 mcg Oral QAC breakfast   lidocaine  1 patch Transdermal QHS   metoprolol succinate  12.5 mg Oral Daily   nicotine  14 mg Transdermal Daily   polyethylene glycol  17 g Oral Daily   pravastatin  40 mg Oral q1800   sodium chloride flush  3 mL Intravenous Q12H    Infusions:  sodium chloride      PRN Medications: sodium chloride, acetaminophen **OR** acetaminophen, albuterol, guaiFENesin-dextromethorphan, sodium chloride flush    Patient Profile    Mr Keesey is a 81 year old with a history of PE, PAF, ILD, COPD, and diastolic CHF with RV failure, and HFpEF.    Admitted with A/C respiratory failure + A/C HFpEF  Assessment/Plan   1. A/C Hypoxic Respiratory Failure - On 5 liters at home. Placed on Bipap in the ED and had weaned back to 5 liters. - On 8L HFNC - Multifactorial (ILD, PAH and A/c CHF) -  c/w nebs/ bronchodilators   2. A/C HFpEF---> RV Failure Echo in 11/22 showed EF 60-65%, mildly decreased RV systolic function with mild RV enlargement, mild MR, moderate TR, PASP 85 mmHg, mild aortic stenosis and mild aortic insufficiency, IVC dilated. The RV failure may be primarily due to pulmonary hypertension. RHC in 12/22 showed normal filling pressures with severe PH. -Volume status looks ok. Stop IV lasix and start torsemide 40 mg twice a day.    3. PAH -Pulmonary venous hypertension on 7/22 RHC.  However, last echo in 11/22 showed PASP up to 85 mmHg. No evidence for chronic PE on 9/22 V/Q scan.  RHC in 12/22 showed normal filling pressures with severe pulmonary arterial hypertension.  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    - With PH-ILD, a trial of Tyvaso-DPI planned. Tyvaso application pending.   - Volume status looks ok.   4. PAF  -Maintaining SR  -On eliquis 5 mg twice a day.    5. ILD - followed by Dr. [name]  - seen by pulmonary here, doubt acute flare. IV methylprednisolone stopped    6. DMII -On SSI   7. CKD Stage IIIa -Creatinine 1.3>1.3>1.3>1.2->1.3   8.   Hypothyroidism -On Levothyroxine.    9.Tobacco Abuse -Last smoked 2 weeks ago.   10. Leukocytosis  - WBC 6.1>>13.1>8.9  - Afebrile - Suspect 2/2 recent steroids  - Given higher O2 requirements, checked PCT to r/o PNA, was negative  11. Back pain   Consult PT.   Length of Stay: [place], NP  01/02/2022, 7:52 AM  Patient seen with NP, agree with the above note.   She is on 6L HFNC today, uses 4L  at home.  Good diuresis on IV Lasix though I/Os not all recorded.   She was very weak with PT today.  Main complaint is back pain.   General: NAD Neck: JVP 8 cm, no thyromegaly or thyroid nodule.  Lungs: Dry crackles at bases.  CV: Nondisplaced PMI.  Heart regular S1/S2, no S3/S4, no murmur.  No peripheral edema.  Abdomen: Soft, nontender, no hepatosplenomegaly, no distention.  Skin: Intact without lesions or rashes.  Neurologic: Alert and oriented x 3.  Psych: Normal affect. Extremities: No clubbing or  cyanosis.  HEENT: Normal.   Volume improved, transition back to torsemide 40 mg bid.  We are working on getting her Tyvaso as an outpatient.  She still is on more than her home oxygen (6L vs 4L).   Severe back pain, per [facility].   PT recommends SNF, patient prefers SNF as well.  Social work to help with this.   Loralie Champagne 01/02/2022 1:23 PM

## 2022-01-02 NOTE — Progress Notes (Signed)
RT Note:  Patient requested RT to return at 01:00 for BIPAP. RT at bedside. Patient is out of bed in chair. Patient states she is in a lot of pain at this time and is not ready for BIPAP. Currenly on 10L [place]. No distress noted. Patient and RN aware to call for RT when ready for BIPAP.

## 2022-01-02 NOTE — Care Management Important Message (Signed)
Important Message  Patient Details  Name: THANYA CEGIELSKI MRN: [id number] Date of Birth: Sep 10, 1941   Medicare Important Message Given:  Yes     [name] 01/02/2022, 10:05 AM
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
## 2022-01-02 NOTE — NC FL2 (Signed)
MEDICAID FL2 LEVEL OF CARE SCREENING TOOL     IDENTIFICATION  Patient Name: Elizabeth Melton Birthdate: 31-Jan-1941 Sex: female Admission Date (Current Location): 12/28/2021  [facility] and [place] Number:  Herbalist and Address:  [facility], [place] [address], [place], [place] [address]      Provider Number: [id number]  Attending Physician Name and Address:  Domenic Polite, MD  Relative Name and Phone Number:       Current Level of Care: Hospital Recommended Level of Care: [place] Prior Approval Number:    Date Approved/Denied:   PASRR Number: [id number]  Discharge Plan: SNF    Current Diagnoses: Patient Active Problem List   Diagnosis Date Noted   Acute respiratory failure ([place]) 12/29/2021   COPD with chronic bronchitis and emphysema (HCC)    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  12/28/2021   Hyperkalemia 12/28/2021   CKD (chronic kidney disease) 12/28/2021   Acute right-sided CHF (congestive heart failure) ([place]) 11/06/2021   Elevated troponin 11/03/2021   Constipation 11/03/2021   Pulmonary HTN (HCC)    Acute on chronic systolic (congestive) heart failure ([place]) 05/19/2021   Acute on chronic respiratory failure with hypoxia (HCC) 05/19/2021   SOB (shortness of breath) 05/19/2021   AF (paroxysmal atrial fibrillation) ([place]) 05/19/2021   Acute on chronic congestive heart failure ([place])    Demand ischemia (HCC)    Acute respiratory failure with hypoxia ([place]) 09/06/2020   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   ILD (interstitial lung disease) ([place]) 10/23/2019   Chronic respiratory failure with hypoxia ([place]) 10/23/2019   Essential (primary) hypertension 09/18/2018   PAD (peripheral artery disease) ([place]) 09/18/2018   Tobacco abuse 09/18/2018   Aortic regurgitation 09/18/2018   Hypothyroidism    Hyperlipidemia/PAD    Type 2 diabetes mellitus with  hyperglycemia (HCC)     Orientation RESPIRATION BLADDER Height & Weight     Self, Time, Situation, Place  O2 (4-5L) Continent, External catheter Weight: 167 lb 12.3 oz (76.1 kg) Height:  _0  (167.6 cm)  BEHAVIORAL SYMPTOMS/MOOD NEUROLOGICAL BOWEL NUTRITION STATUS      Continent Diet (Please see DC Summary)  AMBULATORY STATUS COMMUNICATION OF NEEDS Skin   Limited Assist Verbally Normal                       Personal Care Assistance Level of Assistance  Bathing, Feeding, Dressing Bathing Assistance: Maximum assistance Feeding assistance: Independent Dressing Assistance: Limited assistance     Functional Limitations Info  Sight, Hearing, Speech Sight Info: Impaired Hearing Info: Adequate Speech Info: Adequate    SPECIAL CARE FACTORS FREQUENCY  PT (By licensed PT), OT (By licensed OT)     PT Frequency: 5x/week OT Frequency: 5x/week            Contractures Contractures Info: Not present    Additional Factors Info  Code Status, Allergies, Insulin Sliding Scale Code Status Info: FULL Allergies Info: Lisinopril   Insulin Sliding Scale Info: See Med List       Current Medications (01/02/2022):  This is the current hospital active medication list Current Facility-Administered Medications  Medication Dose Route Frequency Provider Last Rate Last Admin   0.9 %  sodium chloride infusion  250 mL Intravenous PRN Orma Flaming, MD       acetaminophen (TYLENOL) tablet 650 mg  650 mg Oral Q6H PRN Orma Flaming, MD   650 mg at  01/01/22 2039   Or   acetaminophen (TYLENOL) suppository 650 mg  650 mg Rectal Q6H PRN Orma Flaming, MD       albuterol (PROVENTIL) (2.5 MG/3ML) 0.083% nebulizer solution 2.5 mg  2.5 mg Nebulization Q4H PRN Domenic Polite, MD   2.5 mg at 12/30/21 2017   apixaban (ELIQUIS) tablet 5 mg  5 mg Oral BID Orma Flaming, MD   5 mg at 01/02/22 0919   arformoterol (BROVANA) nebulizer solution 15 mcg  15 mcg Nebulization BID Orma Flaming, MD   15 mcg at  01/02/22 3149   And   umeclidinium bromide (INCRUSE ELLIPTA) 62.5 MCG/ACT 1 puff  1 puff Inhalation Daily Orma Flaming, MD   1 puff at 01/02/22 0905   budesonide (PULMICORT) nebulizer solution 0.25 mg  0.25 mg Nebulization BID Mick Sell, PA-C   0.25 mg at 01/02/22 7026   buPROPion (WELLBUTRIN XL) 24 hr tablet 150 mg  150 mg Oral Daily Orma Flaming, MD   150 mg at 01/02/22 3785   diclofenac Sodium (VOLTAREN) 1 % topical gel 2 g  2 g Topical TID Domenic Polite, MD       guaiFENesin [facility]) 12 hr tablet 600 mg  600 mg Oral BID Domenic Polite, MD   600 mg at 01/02/22 0919   guaiFENesin-dextromethorphan (ROBITUSSIN DM) 100-10 MG/5ML syrup 5 mL  5 mL Oral Q4H PRN Domenic Polite, MD   5 mL at 12/29/21 2233   insulin aspart (novoLOG) injection 0-15 Units  0-15 Units Subcutaneous TID WC Orma Flaming, MD   2 Units at 01/02/22 0630   insulin glargine-yfgn (SEMGLEE) injection 20 Units  20 Units Subcutaneous Daily Domenic Polite, MD   20 Units at 01/02/22 0919   ipratropium-albuterol (DUONEB) 0.5-2.5 (3) MG/3ML nebulizer solution 3 mL  3 mL Nebulization BID Domenic Polite, MD   3 mL at 01/02/22 8850   levothyroxine (SYNTHROID) tablet 75 mcg  75 mcg Oral QAC breakfast Orma Flaming, MD   75 mcg at 01/02/22 0552   lidocaine (LIDODERM) 5 % 1 patch  1 patch Transdermal QHS Shela Leff, MD   1 patch at 01/01/22 2303   methocarbamol (ROBAXIN) tablet 750 mg  750 mg Oral BID Domenic Polite, MD   750 mg at 01/02/22 0919   metoprolol succinate (TOPROL-XL) 24 hr tablet 12.5 mg  12.5 mg Oral Daily Orma Flaming, MD   12.5 mg at 01/02/22 2774   nicotine (NICODERM CQ - dosed in mg/24 hours) patch 14 mg  14 mg Transdermal Daily Orma Flaming, MD   14 mg at 01/02/22 1287   polyethylene glycol (MIRALAX / GLYCOLAX) packet 17 g  17 g Oral Daily Domenic Polite, MD   17 g at 01/02/22 0919   pravastatin (PRAVACHOL) tablet 40 mg  40 mg Oral q1800 Orma Flaming, MD   40 mg at 01/01/22 1730   sodium  chloride flush (NS) 0.9 % injection 3 mL  3 mL Intravenous Q12H Orma Flaming, MD   3 mL at 01/02/22 0924   sodium chloride flush (NS) 0.9 % injection 3 mL  3 mL Intravenous PRN Orma Flaming, MD       torsemide [facility]) tablet 40 mg  40 mg Oral BID Clegg, [name] D, NP   40 mg at 01/02/22 8676     Discharge Medications: Please see discharge summary for a list of discharge medications.  Relevant Imaging Results:  Relevant Lab Results:   Additional Information SSN#: [id number] Pt will need a BiPAP for respiratory distress,  she doesn't have one from home to bring  [employer], LCSW

## 2022-01-02 NOTE — Progress Notes (Signed)
PROGRESS NOTE    Elizabeth Melton  TZG:[id number] DOB: November 24, 1941 DOA: 12/28/2021 PCP: Kathyrn Lass, MD  Brief Narrative: 80/F with history of chronic respiratory failure, COPD and interstitial lung disease on 5 L O2, severe pulmonary hypertension, chronic diastolic CHF, RV failure, type 2 diabetes mellitus, hypertension, paroxysmal atrial fibrillation, history of pulmonary embolism on Eliquis, PAD presented to the ED with acute worsening shortness of breath since yesterday.  When EMS arrived her O2 sats were 85% on 5 L, she was placed on BiPAP and transferred to the ED, reported missing some of her diuretic doses in the last few days. In the ED placed on BiPAP for respiratory distress, chest x-ray noted chronic findings, labs were unremarkable   Subjective: -Denies any dyspnea, this morning complains of severe low back pain, worse than usual, difficulty mobilizing  Assessment & Plan:  Acute on chronic hypoxic respiratory failure, on 5L O2 at home 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 Severe PAH -Recent right heart cath 12/14 noted normal filling pressures, severe PAH and with low cardiac output, plan  to start Tyvaso as outpt -Patient admits to missing/taking less diuretics recently -Echo: 10/2021-showed EF 60-65%, mildly decreased RV systolic function with mild RV enlargement, mild MR, moderate TR, PASP 85 mmHg -Cardiology following, diuresed with IV Lasix, 4.1 L negative, appears euvolemic,  transitioned to oral torsemide today -Appreciate pulmonary input as well, ILD progression felt to be less likely, steroids discontinued -Started BiPAP nightly, TOC consulted for trilogy -Palliative care consulted for goals of care, patient and daughter want aggressive management -Increase activity, remains dyspneic in bed with minimal activity -Continue to wean O2 as tolerated, dropped down again to 6 L this morning, overnight was back on 10 L -Discharge  planning, home when O2 requirements are stable around 6  Acute on chronic low back pain -Appears musculoskeletal, no history of injury or fall -Add Voltaren gel, Robaxin today  CKD (chronic kidney disease) stage IIIa -Stable, monitor with diuresis  Hyperkalemia- (present on admission) -Resolved  AF (paroxysmal atrial fibrillation) ([place])- (present on admission) -In sinus rhythm, continue toprol-xl and eliquis   COPD ILD (interstitial lung disease) ([place])- (present on admission) Followed by pulmonology and recently seen this month 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  Considering anti-fibrotic therapy  -Wean O2 as tolerated  Tobacco abuse- (present on admission) Continue nicotine patch   Essential (primary) hypertension- (present on admission) Blood pressure mildly elevated Continue home medication: toprol-xl 12.23m daily   Type 2 diabetes mellitus with hyperglycemia ([place]- (present on admission) a1c in 10/2021: 7.6 -Continue Lantus, CBGs are stable now  Hyperlipidemia/PAD- (present on admission) Continue mevacor  Hypothyroidism- (present on admission) Last TSH 05/2021 and wnl Continue home synthroid  DVT prophylaxis: Eliquis Code Status: Full code Family Communication: Updated daughter 3 days ago Disposition Plan: Home pending improvement in respiratory status, back pain, hopefully tomorrow   Consultants:  Cardiology Pulmonary  Procedures:   Antimicrobials:    Objective: Vitals:   01/02/22 0554 01/02/22 0623 01/02/22 0722 01/02/22 0905  BP: (!) 123/47  (!) 125/54   Pulse:   67 69  Resp: _0 Temp: 98.2 F (36.8 C)  98 F (36.7 C)   TempSrc:   Oral   SpO2: 91%  95% 90%  Weight:  76.1 kg    Height:        Intake/Output Summary (Last 24 hours) at 01/02/2022 1217 Last data filed at 01/02/2022 0905 Gross per 24 hour  Intake 1320 ml  Output 1650 ml  Net -330 ml   Filed Weights   12/31/21 0506 01/01/22 0533 01/02/22 0623  Weight: 75.4 kg 74.9 kg  76.1 kg    Examination:  Chronically ill female sitting up in bed, AAOx3, mild respiratory distress HEENT: No JVD CVS: S1-S2, regular rate rhythm Lungs: Few basilar rales, otherwise clear Abdomen: Soft, nontender, bowel sounds present Extremities: No edema  Skin: No rashes Psychiatry: Judgement and insight appear normal. Mood & affect appropriate.     Data Reviewed:   CBC: Recent Labs  Lab 12/28/21 1217 12/29/21 0331 12/30/21 0320 01/02/22 0422  WBC 8.7 6.1 13.1* 8.9  NEUTROABS 6.6  --   --   --   HGB 14.3 14.0 14.3 15.6*  HCT 46.0 41.9 43.5 48.6*  MCV 106.2* 102.2* 102.6* 102.7*  PLT 160 132* 145* 371   Basic Metabolic Panel: Recent Labs  Lab 12/29/21 0331 12/30/21 0320 12/31/21 0915 01/01/22 0150 01/02/22 0422  NA 137 136 139 138 133*  K 4.4 4.1 4.3 4.3 4.3  CL 107 104 105 102 101  CO2 21* _0 GLUCOSE 292* 176* 192* 127* 202*  BUN 30* 31* 31* 29* 35*  CREATININE 1.31* 1.30* 1.26* 1.16* 1.28*  CALCIUM 8.4* 8.7* 8.9 8.7* 8.3*  MG  --  2.3  --  2.7*  --    GFR: Estimated Creatinine Clearance: 37.1 mL/min (A) (by C-G formula based on SCr of 1.28 mg/dL (H)). Liver Function Tests: Recent Labs  Lab 12/28/21 1315  AST 38  ALT 21  ALKPHOS 54  BILITOT 1.4*  PROT 7.5  ALBUMIN 3.3*   No results for input(s): LIPASE, AMYLASE in the last 168 hours. No results for input(s): AMMONIA in the last 168 hours. Coagulation Profile: No results for input(s): INR, PROTIME in the last 168 hours. Cardiac Enzymes: No results for input(s): CKTOTAL, CKMB, CKMBINDEX, TROPONINI in the last 168 hours. BNP (last 3 results) No results for input(s): PROBNP in the last 8760 hours. HbA1C: No results for input(s): HGBA1C in the last 72 hours. CBG: Recent Labs  Lab 01/01/22 0636 01/01/22 1056 01/01/22 1609 01/01/22 2122 01/02/22 0620  GLUCAP 142* 176* 143* 281* 131*   Lipid Profile: No results for input(s): CHOL, HDL, LDLCALC, TRIG, CHOLHDL, LDLDIRECT in the  last 72 hours. Thyroid Function Tests: No results for input(s): TSH, T4TOTAL, FREET4, T3FREE, THYROIDAB in the last 72 hours. Anemia Panel: No results for input(s): VITAMINB12, FOLATE, FERRITIN, TIBC, IRON, RETICCTPCT in the last 72 hours. Urine analysis:    Component Value Date/Time   COLORURINE YELLOW 12/28/2021 1510   APPEARANCEUR CLEAR 12/28/2021 1510   APPEARANCEUR Clear 09/20/2021 1449   LABSPEC 1.010 12/28/2021 1510   PHURINE 6.0 12/28/2021 1510   GLUCOSEU NEGATIVE 12/28/2021 1510   HGBUR NEGATIVE 12/28/2021 [place] 12/28/2021 1510   BILIRUBINUR Negative 09/20/2021 [place] 12/28/2021 1510   PROTEINUR NEGATIVE 12/28/2021 1510   NITRITE NEGATIVE 12/28/2021 [place] 12/28/2021 1510   Sepsis Labs: _1 (procalcitonin:4,lacticidven:4)  ) Recent Results (from the past 240 hour(s))  Resp Panel by RT-PCR (Flu A&B, Covid) Nasopharyngeal Swab     Status: None   Collection Time: 12/28/21  3:50 PM   Specimen: Nasopharyngeal Swab; Nasopharyngeal(NP) swabs in vial transport medium  Result Value Ref Range Status   SARS Coronavirus 2 by RT PCR NEGATIVE NEGATIVE Final    Comment: (NOTE) SARS-CoV-2 target nucleic acids are NOT DETECTED.  The  SARS-CoV-2 RNA is generally detectable in upper respiratory specimens during the acute phase of infection. The lowest concentration of SARS-CoV-2 viral copies this assay can detect is 138 copies/mL. A negative result does not preclude SARS-Cov-2 infection and should not be used as the sole basis for treatment or other patient management decisions. A negative result may occur with  improper specimen collection/handling, submission of specimen other than nasopharyngeal swab, presence of viral mutation(s) within the areas targeted by this assay, and inadequate number of viral copies(<138 copies/mL). A negative result must be combined with clinical observations, patient history, and  epidemiological information. The expected result is Negative.  Fact Sheet for Patients:  EntrepreneurPulse.com.au  Fact Sheet for Healthcare Providers:  IncredibleEmployment.be  This test is no t yet approved or cleared by the [place] FDA and  has been authorized for detection and/or diagnosis of SARS-CoV-2 by FDA under an Emergency Use Authorization (EUA). This EUA will remain  in effect (meaning this test can be used) for the duration of the COVID-19 declaration under Section 564(b)(1) of the Act, 21 U.S.C.section 360bbb-3(b)(1), unless the authorization is terminated  or revoked sooner.       Influenza A by PCR NEGATIVE NEGATIVE Final   Influenza B by PCR NEGATIVE NEGATIVE Final    Comment: (NOTE) The Xpert Xpress SARS-CoV-2/FLU/RSV plus assay is intended as an aid in the diagnosis of influenza from Nasopharyngeal swab specimens and should not be used as a sole basis for treatment. Nasal washings and aspirates are unacceptable for Xpert Xpress SARS-CoV-2/FLU/RSV testing.  Fact Sheet for Patients: EntrepreneurPulse.com.au  Fact Sheet for Healthcare Providers: IncredibleEmployment.be  This test is not yet approved or cleared by the [place] FDA and has been authorized for detection and/or diagnosis of SARS-CoV-2 by FDA under an Emergency Use Authorization (EUA). This EUA will remain in effect (meaning this test can be used) for the duration of the COVID-19 declaration under Section 564(b)(1) of the Act, 21 U.S.C. section 360bbb-3(b)(1), unless the authorization is terminated or revoked.  Performed at [facility], [place] [address], [place], [place] [address]      Radiology Studies: No results found.   Scheduled Meds:  apixaban  5 mg Oral BID   arformoterol  15 mcg Nebulization BID   And   umeclidinium bromide  1 puff Inhalation Daily   budesonide (PULMICORT) nebulizer solution   0.25 mg Nebulization BID   buPROPion  150 mg Oral Daily   diclofenac Sodium  2 g Topical TID   guaiFENesin  600 mg Oral BID   insulin aspart  0-15 Units Subcutaneous TID WC   insulin glargine-yfgn  20 Units Subcutaneous Daily   ipratropium-albuterol  3 mL Nebulization BID   levothyroxine  75 mcg Oral QAC breakfast   lidocaine  1 patch Transdermal QHS   methocarbamol  750 mg Oral BID   metoprolol succinate  12.5 mg Oral Daily   nicotine  14 mg Transdermal Daily   polyethylene glycol  17 g Oral Daily   pravastatin  40 mg Oral q1800   sodium chloride flush  3 mL Intravenous Q12H   torsemide  40 mg Oral BID   Continuous Infusions:  sodium chloride       LOS: 4 days    Time spent: 96mn  PDomenic Polite MD [facility]   01/02/2022, 12:17 PM

## 2022-01-02 NOTE — Plan of Care (Signed)
Problem: Education: Goal: Ability to demonstrate management of disease process will improve Outcome: Progressing   Problem: Education: Goal: Ability to verbalize understanding of medication therapies will improve Outcome: Progressing   Problem: Activity: Goal: Capacity to carry out activities will improve Outcome: Progressing

## 2022-01-02 NOTE — TOC Progression Note (Addendum)
Transition of Care [facility]) - Progression Note    Patient Details  Name: Elizabeth Melton MRN: [id number] Date of Birth: 10/11/1941  Transition of Care [facility]) CM/SW Contact  Victorious Cosio, LCSW Phone Number: 01/02/2022, 2:19 PM  Clinical Narrative:    CSW received consult for possible SNF placement at time of discharge. CSW spoke with patient and daughter, [name] at bedside. Patient reported that patient is currently unable to care for herself at her home given patients current physical needs and fall risk. Patient expressed understanding of PT recommendation and is agreeable to SNF placement at time of discharge. Patient reports preference for SNF in [place]. CSW discussed insurance authorization process and provided Medicare SNF ratings list. Patient has not received COVID vaccines. CSW will send out referrals for review. Patient expressed being hopeful for rehab and to feel better soon. No further questions reported at this time.   Skilled Nursing Rehab Facilities-   [email or web address]   Ratings out of 5 possible   Name Address  Phone # [place] Inspection Overall  [facility] [address] [place] _0 [facility]  [address] [place], [place] [phone] _1 [facility] [place], [place] _2 [place] [address], [place] _3 [facility] [address], [place] _4 [place] [address] [place] _5 [facility] [address], [place] _6 [facility] [address] [place] _7 Madelynn Done ([place]) [address], [place] _8 [facility] [address], [place] [phone] _9 [facility] [address], [place] [phone] _10 [facility] ([place]) [place], [place] [phone] _11 [place], [place]      [facility] [address] [place] _12 [facility] [place] [address] [place] _13 [address] [place], [place] [phone] _14 [facility] [address], [employer] [phone] _15 [address] (no [facility]) [place] [address], [place] [phone] _16 [facility] (No [employer]) [address], [place] _17 [place] (No UHC) [address] [place] [phone] _18 [facility] [address] [place] [phone] _19 [place] [address] [place]  [phone] _20 Dustin Flock [address] [place]i [place] [phone] _21 [facility] [address], [place] _22 [place] [address] [place] [phone] _23 Chanda Busing [place], [place] _24 [facility] [address] [place] <[email or web address]>_8<\/[email or web address]>_69 [facility] [address] [place] _26 [facility] [place], [place] _27 [place] [address], [place] [phone]  _0 [facility] [place] [address], [place] _1 [place] (No Humana) [address] [place] _2 [facility] [address] [place] [phone] _3 [facility], [place] _4 [facility]) [place], [place] _5 [facility]) [place], [place] _6 [facility] [address], [place] _7 [address] [place], [place] _8 [facility]) [place], [place] _9 Pending bed offers.  CSW will continue to follow throughout discharge.   Expected Discharge Plan: [place] Barriers to Discharge: Continued Medical Work up  Expected Discharge Plan and Services Expected Discharge Plan: [place] In-house Referral: Clinical Social Work Discharge Planning Services: CM Consult Post Acute Care Choice: [place] arrangements for the past 2 months: Apartment                   DME Agency: [employer] Date DME Agency Contacted: 12/30/21 Time DME Agency Contacted: 1600 Representative spoke with at DME Agency: [facility] ([place]) Interventions    Readmission Risk Interventions Readmission Risk Prevention Plan 09/10/2020  Post Dischage Appt Complete  Medication Screening Complete  Transportation Screening Complete  Some recent data might be hidden   Faydra Korman, MSW, LCSWA [phone] Heart Failure Social Worker

## 2022-01-02 NOTE — Progress Notes (Signed)
RT Note:  per RN patient ready for BIPAP now. Placed on BIPAP, no distress noted.

## 2022-01-02 NOTE — TOC CM/SW Note (Addendum)
HF TOC CM received feedback from [place], pt's current CO2 36.9 and does not qualify through insurance for BIPAP/NIV unless CO2 45. She has been wearing Bipap while in hospital. CM updated attending. Pt will need a reading in am off BIPAP or bedside spirometry by RT to test (FEV1, FVC).  [place], Heart Failure TOC CM [phone]

## 2022-01-03 ENCOUNTER — Inpatient Hospital Stay (HOSPITAL_COMMUNITY): Payer: No Typology Code available for payment source

## 2022-01-03 DIAGNOSIS — I5033 Acute on chronic diastolic (congestive) heart failure: Secondary | ICD-10-CM | POA: Diagnosis not present

## 2022-01-03 LAB — BASIC METABOLIC PANEL
Anion gap: 10 (ref 5–15)
BUN: 33 mg/dL — ABNORMAL HIGH (ref 8–23)
CO2: 24 mmol/L (ref 22–32)
Calcium: 8.3 mg/dL — ABNORMAL LOW (ref 8.9–10.3)
Chloride: 99 mmol/L (ref 98–111)
Creatinine, Ser: 1.35 mg/dL — ABNORMAL HIGH (ref 0.44–1.00)
GFR, Estimated: 40 mL/min — ABNORMAL LOW (ref >60)
Glucose, Bld: 134 mg/dL — ABNORMAL HIGH (ref 70–99)
Potassium: 4.4 mmol/L (ref 3.5–5.1)
Sodium: 133 mmol/L — ABNORMAL LOW (ref 135–145)

## 2022-01-03 LAB — BLOOD GAS, ARTERIAL
Acid-Base Excess: 1.9 mmol/L (ref 0.0–2.0)
Bicarbonate: 25.4 mmol/L (ref 20.0–28.0)
Drawn by: 34762
FIO2: 48
O2 Saturation: 97.7 %
Patient temperature: 36.9
pCO2 arterial: 35.9 mmHg (ref 32.0–48.0)
pH, Arterial: 7.464 — ABNORMAL HIGH (ref 7.350–7.450)
pO2, Arterial: 97.8 mmHg (ref 83.0–108.0)

## 2022-01-03 LAB — CBC
HCT: 44.8 % (ref 36.0–46.0)
Hemoglobin: 14.9 g/dL (ref 12.0–15.0)
MCH: 33.9 pg (ref 26.0–34.0)
MCHC: 33.3 g/dL (ref 30.0–36.0)
MCV: 101.8 fL — ABNORMAL HIGH (ref 80.0–100.0)
Platelets: 187 10*3/uL (ref 150–400)
RBC: 4.4 MIL/uL (ref 3.87–5.11)
RDW: 17 % — ABNORMAL HIGH (ref 11.5–15.5)
WBC: 8.4 10*3/uL (ref 4.0–10.5)
nRBC: 0 % (ref 0.0–0.2)

## 2022-01-03 LAB — GLUCOSE, CAPILLARY
Glucose-Capillary: 125 mg/dL — ABNORMAL HIGH (ref 70–99)
Glucose-Capillary: 244 mg/dL — ABNORMAL HIGH (ref 70–99)
Glucose-Capillary: 108 mg/dL — ABNORMAL HIGH (ref 70–99)
Glucose-Capillary: 155 mg/dL — ABNORMAL HIGH (ref 70–99)

## 2022-01-03 MED ORDER — METHOCARBAMOL 500 MG PO TABS
250.0000 mg | ORAL_TABLET | Freq: Once | ORAL | Status: AC
  Administered 2022-01-03: 09:00:00 250 mg via ORAL
  Filled 2022-01-03: qty 1

## 2022-01-03 MED ORDER — LIDOCAINE 5 % EX PTCH
2.0000 | MEDICATED_PATCH | Freq: Every day | CUTANEOUS | Status: DC
  Administered 2022-01-03 – 2022-01-12 (×7): 2 via TRANSDERMAL
  Filled 2022-01-03 (×9): qty 2

## 2022-01-03 MED ORDER — METHOCARBAMOL 500 MG PO TABS
1000.0000 mg | ORAL_TABLET | Freq: Two times a day (BID) | ORAL | Status: DC
  Administered 2022-01-03 – 2022-01-06 (×7): 1000 mg via ORAL
  Filled 2022-01-03 (×7): qty 2

## 2022-01-03 NOTE — Progress Notes (Signed)
ABG sent to lab

## 2022-01-03 NOTE — Plan of Care (Signed)
Problem: Education: Goal: Ability to demonstrate management of disease process will improve Outcome: Progressing   Problem: Education: Goal: Ability to verbalize understanding of medication therapies will improve Outcome: Progressing   Problem: Activity: Goal: Capacity to carry out activities will improve Outcome: Progressing

## 2022-01-03 NOTE — Progress Notes (Signed)
PROGRESS NOTE    Elizabeth Melton  ZHG:[id number] DOB: 12-01-1941 DOA: 12/28/2021 PCP: Kathyrn Lass, MD  Brief Narrative: 80/F with history of chronic respiratory failure, COPD and interstitial lung disease on 5 L O2, severe pulmonary hypertension, chronic diastolic CHF, RV failure, type 2 diabetes mellitus, hypertension, paroxysmal atrial fibrillation, history of pulmonary embolism on Eliquis, PAD presented to the ED with acute worsening shortness of breath since yesterday.  When EMS arrived her O2 sats were 85% on 5 L, she was placed on BiPAP and transferred to the ED, reported missing some of her diuretic doses in the last few days. In the ED placed on BiPAP for respiratory distress, chest x-ray noted chronic findings, labs were unremarkable -Followed by cardiology and pulmonary, started on IV Lasix with modest response -Oxygen requirements continued to be tenuous -1/23 started having worsening of her chronic low back pain   Subjective: -Continues to report severe low back pain, difficulty ambulating as result, her breathing is close to baseline, minimally short of breath with any activity including conversation  Assessment & Plan:  Acute on chronic hypoxic respiratory failure, on 5L O2 at home 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 Severe PAH -Recent right heart cath 12/14 noted normal filling pressures, severe PAH and with low cardiac output, plan  to start Tyvaso as outpt -Patient admits to missing/taking less diuretics recently -Echo: 10/2021-showed EF 60-65%, mildly decreased RV systolic function with mild RV enlargement, mild MR, moderate TR, PASP 85 mmHg -Cardiology following, diuresed with IV Lasix, 5.9 L negative, appears euvolemic,  transitioned to oral torsemide yesterday -Appreciate pulmonary input as well, ILD progression felt to be less likely, steroids discontinued -Started BiPAP nightly, TOC consulted for trilogy-appears to not meet  criteria based on CO2 levels -Palliative care consulted for goals of care, patient and daughter want aggressive management -Increase activity, remains dyspneic in bed with minimal activity -Oxygen weaned down to 6 L again this morning, anticipate she will need at least 6 L at DC -Now plan for SNF, TOC following  Acute on chronic low back pain -Appears musculoskeletal, no history of injury or fall -Check x-rays today, continue Voltaren gel, add lidocaine patch, increase Robaxin dose, K pad  -Physical therapy  -Plan for SNF   CKD (chronic kidney disease) stage IIIa -Stable at baseline  Hyperkalemia- (present on admission) -Resolved  AF (paroxysmal atrial fibrillation) ([place])- (present on admission) -In sinus rhythm, continue toprol-xl and eliquis   COPD ILD (interstitial lung disease) ([place])- (present on admission) Followed by pulmonology and recently seen this month 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  Considering anti-fibrotic therapy  -Wean O2 as tolerated  Tobacco abuse- (present on admission) Continue nicotine patch   Essential (primary) hypertension- (present on admission) Blood pressure stable Continue toprol-xl 12.53m daily   Type 2 diabetes mellitus with hyperglycemia ([place]- (present on admission) a1c in 10/2021: 7.6 -Continue Lantus, CBGs are stable now  Hyperlipidemia/PAD- (present on admission) Continue mevacor  Hypothyroidism- (present on admission) Last TSH 05/2021 and wnl Continue  synthroid  DVT prophylaxis: Eliquis Code Status: Full code Family Communication: Updated daughter 3 days ago Disposition Plan: Home pending improvement in respiratory status, back pain, hopefully tomorrow   Consultants:  Cardiology Pulmonary  Procedures:   Antimicrobials:    Objective: Vitals:   01/03/22 0434 01/03/22 0750 01/03/22 0902 01/03/22 1120  BP: (!) 101/56 113/61  122/60  Pulse: (!) 55 66  68  Resp: 16 (!) 22  19  Temp: 98.3 F (  36.8 C) 98.9 F (37.2  C)  97.8 F (36.6 C)  TempSrc: Oral Oral  Oral  SpO2: 95% 92% 94% (!) 89%  Weight:      Height:        Intake/Output Summary (Last 24 hours) at 01/03/2022 1411 Last data filed at 01/03/2022 4469 Gross per 24 hour  Intake 480 ml  Output 1050 ml  Net -570 ml   Filed Weights   01/01/22 0533 01/02/22 0623 01/03/22 0120  Weight: 74.9 kg 76.1 kg 74.4 kg    Examination:  Chronically ill female sitting up in bed, AAOx3, mild respiratory distress HEENT: No JVD CVS: S1-S2, regular rate rhythm Lungs: Few basilar rales, otherwise clear Abdomen: Soft, nontender, bowel sounds present Extremities: No edema  Skin: No rashes Psychiatry: Judgement and insight appear normal. Mood & affect appropriate.     Data Reviewed:   CBC: Recent Labs  Lab 12/28/21 1217 12/29/21 0331 12/30/21 0320 01/02/22 0422 01/03/22 0401  WBC 8.7 6.1 13.1* 8.9 8.4  NEUTROABS 6.6  --   --   --   --   HGB 14.3 14.0 14.3 15.6* 14.9  HCT 46.0 41.9 43.5 48.6* 44.8  MCV 106.2* 102.2* 102.6* 102.7* 101.8*  PLT 160 132* 145* 155 507   Basic Metabolic Panel: Recent Labs  Lab 12/30/21 0320 12/31/21 0915 01/01/22 0150 01/02/22 0422 01/03/22 0401  NA 136 139 138 133* 133*  K 4.1 4.3 4.3 4.3 4.4  CL 104 105 102 101 99  CO2 _0 GLUCOSE 176* 192* 127* 202* 134*  BUN 31* 31* 29* 35* 33*  CREATININE 1.30* 1.26* 1.16* 1.28* 1.35*  CALCIUM 8.7* 8.9 8.7* 8.3* 8.3*  MG 2.3  --  2.7*  --   --    GFR: Estimated Creatinine Clearance: 34.8 mL/min (A) (by C-G formula based on SCr of 1.35 mg/dL (H)). Liver Function Tests: Recent Labs  Lab 12/28/21 1315  AST 38  ALT 21  ALKPHOS 54  BILITOT 1.4*  PROT 7.5  ALBUMIN 3.3*   No results for input(s): LIPASE, AMYLASE in the last 168 hours. No results for input(s): AMMONIA in the last 168 hours. Coagulation Profile: No results for input(s): INR, PROTIME in the last 168 hours. Cardiac Enzymes: No results for input(s): CKTOTAL, CKMB, CKMBINDEX,  TROPONINI in the last 168 hours. BNP (last 3 results) No results for input(s): PROBNP in the last 8760 hours. HbA1C: No results for input(s): HGBA1C in the last 72 hours. CBG: Recent Labs  Lab 01/02/22 1216 01/02/22 1553 01/02/22 2003 01/03/22 0522 01/03/22 1117  GLUCAP 96 116* 124* 125* 244*   Lipid Profile: No results for input(s): CHOL, HDL, LDLCALC, TRIG, CHOLHDL, LDLDIRECT in the last 72 hours. Thyroid Function Tests: No results for input(s): TSH, T4TOTAL, FREET4, T3FREE, THYROIDAB in the last 72 hours. Anemia Panel: No results for input(s): VITAMINB12, FOLATE, FERRITIN, TIBC, IRON, RETICCTPCT in the last 72 hours. Urine analysis:    Component Value Date/Time   COLORURINE YELLOW 12/28/2021 1510   APPEARANCEUR CLEAR 12/28/2021 1510   APPEARANCEUR Clear 09/20/2021 1449   LABSPEC 1.010 12/28/2021 1510   PHURINE 6.0 12/28/2021 1510   GLUCOSEU NEGATIVE 12/28/2021 1510   HGBUR NEGATIVE 12/28/2021 1510   BILIRUBINUR NEGATIVE 12/28/2021 1510   BILIRUBINUR Negative 09/20/2021 [place] 12/28/2021 1510   PROTEINUR NEGATIVE 12/28/2021 1510   NITRITE NEGATIVE 12/28/2021 1510   LEUKOCYTESUR NEGATIVE 12/28/2021 1510   Sepsis Labs: _1 (procalcitonin:4,lacticidven:4)  ) Recent Results (from the past  240 hour(s))  Resp Panel by RT-PCR (Flu A&B, Covid) Nasopharyngeal Swab     Status: None   Collection Time: 12/28/21  3:50 PM   Specimen: Nasopharyngeal Swab; Nasopharyngeal(NP) swabs in vial transport medium  Result Value Ref Range Status   SARS Coronavirus 2 by RT PCR NEGATIVE NEGATIVE Final    Comment: (NOTE) SARS-CoV-2 target nucleic acids are NOT DETECTED.  The SARS-CoV-2 RNA is generally detectable in upper respiratory specimens during the acute phase of infection. The lowest concentration of SARS-CoV-2 viral copies this assay can detect is 138 copies/mL. A negative result does not preclude SARS-Cov-2 infection and should not be used as the sole  basis for treatment or other patient management decisions. A negative result may occur with  improper specimen collection/handling, submission of specimen other than nasopharyngeal swab, presence of viral mutation(s) within the areas targeted by this assay, and inadequate number of viral copies(<138 copies/mL). A negative result must be combined with clinical observations, patient history, and epidemiological information. The expected result is Negative.  Fact Sheet for Patients:  EntrepreneurPulse.com.au  Fact Sheet for Healthcare Providers:  IncredibleEmployment.be  This test is no t yet approved or cleared by the [place] FDA and  has been authorized for detection and/or diagnosis of SARS-CoV-2 by FDA under an Emergency Use Authorization (EUA). This EUA will remain  in effect (meaning this test can be used) for the duration of the COVID-19 declaration under Section 564(b)(1) of the Act, 21 U.S.C.section 360bbb-3(b)(1), unless the authorization is terminated  or revoked sooner.       Influenza A by PCR NEGATIVE NEGATIVE Final   Influenza B by PCR NEGATIVE NEGATIVE Final    Comment: (NOTE) The Xpert Xpress SARS-CoV-2/FLU/RSV plus assay is intended as an aid in the diagnosis of influenza from Nasopharyngeal swab specimens and should not be used as a sole basis for treatment. Nasal washings and aspirates are unacceptable for Xpert Xpress SARS-CoV-2/FLU/RSV testing.  Fact Sheet for Patients: EntrepreneurPulse.com.au  Fact Sheet for Healthcare Providers: IncredibleEmployment.be  This test is not yet approved or cleared by the [place] FDA and has been authorized for detection and/or diagnosis of SARS-CoV-2 by FDA under an Emergency Use Authorization (EUA). This EUA will remain in effect (meaning this test can be used) for the duration of the COVID-19 declaration under Section 564(b)(1) of the Act,  21 U.S.C. section 360bbb-3(b)(1), unless the authorization is terminated or revoked.  Performed at [facility], [place] [address], [place], [place] [address]      Radiology Studies: DG Lumbar Spine 2-3 Views  Result Date: 01/03/2022 CLINICAL DATA:  Back pain EXAM: LUMBAR SPINE - 2-3 VIEW COMPARISON:  None. FINDINGS: No recent fracture is seen. There is first-degree anterolisthesis at L4-L5 level. There is disc space narrowing at the L4-L5 level. Degenerative changes are noted in facet joints, more so at L4-L5 and L5-S1 levels. Extensive arterial calcifications are seen. Calcifications in the right upper quadrant suggest gallbladder stones. Fibrotic changes are noted in the visualized lower lung fields. IMPRESSION: No recent fracture is seen. There is first-degree anterolisthesis at L4-L5 level along with disc space narrowing. Degenerative changes are noted in facet joints in the lower lumbar spine. Gallbladder stones. Pulmonary fibrosis.  Severe atherosclerotic disease. Electronically Signed   By: Elmer Picker M.D.   On: 01/03/2022 12:30   DG Sacrum/Coccyx  Result Date: 01/03/2022 CLINICAL DATA:  Low back pain EXAM: SACRUM AND COCCYX - 2+ VIEW COMPARISON:  None. FINDINGS: No recent fracture is seen.  There is sclerosis adjacent to the SI joints, especially in the inferior aspect of right SI joint. Osteopenia is seen in bony structures. Vascular calcifications are seen. Coarse calcifications in the pelvis suggest possible calcified uterine fibroids. IMPRESSION: No recent fracture is seen. Osteopenia. Degenerative changes are noted in the SI joints, more so on the right side. Electronically Signed   By: Elmer Picker M.D.   On: 01/03/2022 12:32     Scheduled Meds:  apixaban  5 mg Oral BID   arformoterol  15 mcg Nebulization BID   And   umeclidinium bromide  1 puff Inhalation Daily   budesonide (PULMICORT) nebulizer solution  0.25 mg Nebulization BID   buPROPion  150 mg Oral  Daily   diclofenac Sodium  2 g Topical TID   guaiFENesin  600 mg Oral BID   insulin aspart  0-15 Units Subcutaneous TID WC   insulin glargine-yfgn  20 Units Subcutaneous Daily   ipratropium-albuterol  3 mL Nebulization BID   levothyroxine  75 mcg Oral QAC breakfast   lidocaine  2 patch Transdermal QHS   methocarbamol  1,000 mg Oral BID   metoprolol succinate  12.5 mg Oral Daily   nicotine  14 mg Transdermal Daily   polyethylene glycol  17 g Oral Daily   pravastatin  40 mg Oral q1800   sodium chloride flush  3 mL Intravenous Q12H   torsemide  40 mg Oral BID   Continuous Infusions:  sodium chloride       LOS: 5 days    Time spent: 29mn  PDomenic Polite MD [facility]   01/03/2022, 2:11 PM

## 2022-01-03 NOTE — TOC Progression Note (Addendum)
Transition of Care [facility]) - Progression Note    Patient Details  Name: ARDATH LEPAK MRN: [id number] Date of Birth: [birth year]  Transition of Care [facility]) CM/SW Contact  Jennie Bolar, LCSW Phone Number: 01/03/2022, 12:49 PM  Clinical Narrative:    SNF workup completed yesterday however still no bed offers in the hub. Several have declined due to the health insurance not being in network. 12:41pm - CSW outreached [facility] to see about bed availability but had to leave a voicemail for her to return the call. 12:51pm - HF CSW reached out to [employer] and spoke to [place] who reported she does have a semi-private room available tomorrow but that [facility] is not in network with their SNF and she wouldn't be able to extend a bed offer. 12:56pm - HF CSW had to leave a voicemail for [place] at [facility] to see about availability and if she can take the [facility]. 1:02pm - HF CSW reached out to Burton at [facility] [employer] regarding bed availability and the [facility] and she reported that she doesn't take that insurance they are not in contract and it is a brand new health plan. 1:04pm HF CSW reached out to [facility] and had to leave a voicemail for them to return the call. 3:02pm - HF CSW received a call back from [facility] and they are not able to offer a bed as they aren't in network with the payor source. 1:45pm - In QC/Length of stay meeting HF CSW was told to reach out to [facility] and see if they can offer a bed for Ms. Robey and take the health insurance. 2:04pm - HF CSW spoke with Maudie Mercury at [facility] [phone] and she reported she can extend a bed offer once Ms. Courville can wean down to 5L of 02 and is no longer on 7L. Kim reported that she can provide a BIPAP at night for Ms. Coltrain but still needs her to be weaned from the HFNC. Maudie Mercury will check to see if they have a 10L tank to provide baseline O2 of 5L.  4:40pm - HF CSW spoke with the patient's  daughter [name] [phone] to update her about the lack of bed offers from SNF due to her mom's health insurance and nobody being in network with [facility]. [name] reported that her mom should have [employer] and that someone from the hospital scanned her [facility] card into the system. HF CSW located the [facility] card that was issued on 12/19/20 and reached out to financial services to see if they can locate an active insurance plan with [facility]. CSW informed [name] of the one bed offer from [place] in [employer] and shared the [employer] and [name] would like to confirm [employer] coverage to see if that would help with other bed offers. CSW is awaiting to hear back from financial services for confirmation about coverage.   CSW will continue to follow throughout discharge.   Expected Discharge Plan: Skilled Nursing Facility Barriers to Discharge: Continued Medical Work up  Expected Discharge Plan and Services Expected Discharge Plan: [place] In-house Referral: Clinical Social Work Discharge Planning Services: CM Consult Post Acute Care Choice: [name] arrangements for the past 2 months: Apartment                   DME Agency: [employer] Date DME Agency Contacted: 12/30/21 Time DME Agency Contacted: 1600 Representative spoke with at DME Agency: Bethanne Ginger  Social Determinants of Health (SDOH) Interventions    Readmission Risk Interventions Readmission Risk Prevention Plan 09/10/2020  Post Dischage Appt Complete  Medication Screening Complete  Transportation Screening Complete  Some recent data might be hidden   Kasumi Ditullio, MSW, LCSW [phone] Heart Failure Social Worker

## 2022-01-03 NOTE — Progress Notes (Addendum)
Cardiology Rounding Note  PCP-Cardiologist: [name], MD   Subjective:    Yesterday switched to torsemide 40 mg twice a day. Brisk diuresis noted.   Remains on HFNC at 7 liters.   Complaining of back pain. Having a hard time moving.    Objective:   Weight Range: 74.4 kg Body mass index is 26.47 kg/m.   Vital Signs:   Temp:  [97.9 F (36.6 C)-98.9 F (37.2 C)] 98.9 F (37.2 C) (01/24 0750) Pulse Rate:  [55-69] 66 (01/24 0750) Resp:  [15-22] 22 (01/24 0750) BP: (101-127)/(56-80) 113/61 (01/24 0750) SpO2:  [85 %-95 %] 92 % (01/24 0750) Weight:  [74.4 kg] 74.4 kg (01/24 0120) Last BM Date: 12/31/21  Weight change: Filed Weights   01/01/22 0533 01/02/22 0623 01/03/22 0120  Weight: 74.9 kg 76.1 kg 74.4 kg    Intake/Output:   Intake/Output Summary (Last 24 hours) at 01/03/2022 0757 Last data filed at 01/03/2022 0435 Gross per 24 hour  Intake 240 ml  Output 1950 ml  Net -1710 ml      Physical Exam   General:  In bed. No resp difficulty HEENT: normal Neck: supple. JVP 5-6 . Carotids 2+ bilat; no bruits. No lymphadenopathy or thryomegaly appreciated. Cor: PMI nondisplaced. Regular rate & rhythm. No rubs, gallops or murmurs. Lungs: clear on HFNC at 7 liters  Abdomen: soft, nontender, nondistended. No hepatosplenomegaly. No bruits or masses. Good bowel sounds. Extremities: no cyanosis, clubbing, rash, edema Neuro: alert & orientedx3, cranial nerves grossly intact. moves all 4 extremities w/o difficulty. Affect pleasant  Telemetry   SR 60-70s personally checked.   EKG    No new EKG to review   Labs    CBC Recent Labs    01/02/22 0422 01/03/22 0401  WBC 8.9 8.4  HGB 15.6* 14.9  HCT 48.6* 44.8  MCV 102.7* 101.8*  PLT 155 968   Basic Metabolic Panel Recent Labs    01/01/22 0150 01/02/22 0422 01/03/22 0401  NA 138 133* 133*  K 4.3 4.3 4.4  CL 102 101 99  CO2 _0 GLUCOSE 127* 202* 134*  BUN 29* 35* 33*  CREATININE 1.16*  1.28* 1.35*  CALCIUM 8.7* 8.3* 8.3*  MG 2.7*  --   --    Liver Function Tests No results for input(s): AST, ALT, ALKPHOS, BILITOT, PROT, ALBUMIN in the last 72 hours.  No results for input(s): LIPASE, AMYLASE in the last 72 hours. Cardiac Enzymes No results for input(s): CKTOTAL, CKMB, CKMBINDEX, TROPONINI in the last 72 hours.  BNP: BNP (last 3 results) Recent Labs    11/18/21 1610 12/28/21 1218 12/29/21 0331  BNP 241.9* 565.2* 821.0*    ProBNP (last 3 results) No results for input(s): PROBNP in the last 8760 hours.   D-Dimer No results for input(s): DDIMER in the last 72 hours. Hemoglobin A1C No results for input(s): HGBA1C in the last 72 hours. Fasting Lipid Panel No results for input(s): CHOL, HDL, LDLCALC, TRIG, CHOLHDL, LDLDIRECT in the last 72 hours. Thyroid Function Tests No results for input(s): TSH, T4TOTAL, T3FREE, THYROIDAB in the last 72 hours.  Invalid input(s): FREET3  Other results:   Imaging    No results found.   Medications:     Scheduled Medications:  apixaban  5 mg Oral BID   arformoterol  15 mcg Nebulization BID   And   umeclidinium bromide  1 puff Inhalation Daily   budesonide (PULMICORT) nebulizer solution  0.25 mg Nebulization BID  buPROPion  150 mg Oral Daily   diclofenac Sodium  2 g Topical TID   guaiFENesin  600 mg Oral BID   insulin aspart  0-15 Units Subcutaneous TID WC   insulin glargine-yfgn  20 Units Subcutaneous Daily   ipratropium-albuterol  3 mL Nebulization BID   levothyroxine  75 mcg Oral QAC breakfast   lidocaine  1 patch Transdermal QHS   methocarbamol  750 mg Oral BID   metoprolol succinate  12.5 mg Oral Daily   nicotine  14 mg Transdermal Daily   polyethylene glycol  17 g Oral Daily   pravastatin  40 mg Oral q1800   sodium chloride flush  3 mL Intravenous Q12H   torsemide  40 mg Oral BID    Infusions:  sodium chloride      PRN Medications: sodium chloride, acetaminophen **OR** acetaminophen,  albuterol, guaiFENesin-dextromethorphan, sodium chloride flush    Patient Profile   Mr Oswald is a 81 year old with a history of PE, PAF, ILD, COPD, and diastolic CHF with RV failure, and HFpEF.    Admitted with A/C respiratory failure + A/C HFpEF  Assessment/Plan   1. A/C Hypoxic Respiratory Failure - On 5 liters at home. Placed on Bipap in the ED and had weaned back to 5 liters. - On 7 L HFNC - Multifactorial (ILD, PAH and A/c CHF) -  c/w nebs/ bronchodilators   2. A/C HFpEF---> RV Failure Echo in 11/22 showed EF 60-65%, mildly decreased RV systolic function with mild RV enlargement, mild MR, moderate TR, PASP 85 mmHg, mild aortic stenosis and mild aortic insufficiency, IVC dilated. The RV failure may be primarily due to pulmonary hypertension. RHC in 12/22 showed normal filling pressures with severe PH. -Volume status stable. Continue  torsemide 40 mg twice a day.    3. PAH -Pulmonary venous hypertension on 7/22 RHC.  However, last echo in 11/22 showed PASP up to 85 mmHg. No evidence for chronic PE on 9/22 V/Q scan.  RHC in 12/22 showed normal filling pressures with severe pulmonary arterial hypertension.  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    - With PH-ILD, a trial of Tyvaso-DPI planned. Tyvaso application pending.   - Volume status looks ok.   4. PAF  -Maintaining SR.  -On eliquis 5 mg twice a day.    5. ILD - followed by Dr. [name]  - seen by pulmonary here, doubt acute flare. IV methylprednisolone stopped    6. DMII -On SSI   7. CKD Stage IIIa -Creatinine stable 1.35   8.   Hypothyroidism -On Levothyroxine.    9.Tobacco Abuse -Last smoked 2 weeks ago.   10. Leukocytosis  - WBC resolved.   - Afebrile - Suspect 2/2 recent steroids  - Given higher O2 requirements, checked PCT to r/o PNA, was negative  11. Back pain   Disposition: SNF. SW following.   Length of Stay: [place], NP  01/03/2022, 7:57  AM  Patient seen with NP, agree with the above note.   She is on torsemide 40 mg bid now, good diuresis yesterday. Creatinine stable 1.35.  Main complaint is still low back pain.   General: NAD Neck: JVP 8 cm, no thyromegaly or thyroid nodule.  Lungs: Dry crackles at bases.  CV: Nondisplaced PMI.  Heart regular S1/S2, no S3/S4, no murmur.  No peripheral edema.   Abdomen: Soft, nontender, no hepatosplenomegaly, no distention.  Skin: Intact without lesions or rashes.  Neurologic: Alert and oriented x 3.  Psych: Normal affect. Extremities: No clubbing or cyanosis.  HEENT: Normal.   Volume status and creatinine stable on torsemide 40 mg bid.  We are working on getting her Tyvaso as an outpatient. She still is on more than her home oxygen (6L vs 4L).    Severe back pain, per [facility].   She will need SNF.   Loralie Champagne 01/03/2022 10:51 AM

## 2022-01-03 NOTE — Progress Notes (Signed)
BIPAP not placed HS due to order for ABG off BIPAP for NIV home placement. Patient continues on HFNC. No distresss noted. RT wil continue to monitor.

## 2022-01-04 DIAGNOSIS — I5033 Acute on chronic diastolic (congestive) heart failure: Secondary | ICD-10-CM | POA: Diagnosis not present

## 2022-01-04 DIAGNOSIS — Z7189 Other specified counseling: Secondary | ICD-10-CM

## 2022-01-04 DIAGNOSIS — R0602 Shortness of breath: Secondary | ICD-10-CM

## 2022-01-04 DIAGNOSIS — I10 Essential (primary) hypertension: Secondary | ICD-10-CM

## 2022-01-04 LAB — BASIC METABOLIC PANEL
Anion gap: 9 (ref 5–15)
BUN: 32 mg/dL — ABNORMAL HIGH (ref 8–23)
CO2: 27 mmol/L (ref 22–32)
Calcium: 8.7 mg/dL — ABNORMAL LOW (ref 8.9–10.3)
Chloride: 97 mmol/L — ABNORMAL LOW (ref 98–111)
Creatinine, Ser: 1.38 mg/dL — ABNORMAL HIGH (ref 0.44–1.00)
GFR, Estimated: 39 mL/min — ABNORMAL LOW (ref >60)
Glucose, Bld: 103 mg/dL — ABNORMAL HIGH (ref 70–99)
Potassium: 4.1 mmol/L (ref 3.5–5.1)
Sodium: 133 mmol/L — ABNORMAL LOW (ref 135–145)

## 2022-01-04 LAB — GLUCOSE, CAPILLARY
Glucose-Capillary: 108 mg/dL — ABNORMAL HIGH (ref 70–99)
Glucose-Capillary: 127 mg/dL — ABNORMAL HIGH (ref 70–99)
Glucose-Capillary: 169 mg/dL — ABNORMAL HIGH (ref 70–99)
Glucose-Capillary: 153 mg/dL — ABNORMAL HIGH (ref 70–99)

## 2022-01-04 MED ORDER — BISACODYL 10 MG RE SUPP
10.0000 mg | Freq: Every day | RECTAL | Status: DC | PRN
  Administered 2022-01-05: 10 mg via RECTAL
  Filled 2022-01-04: qty 1

## 2022-01-04 MED ORDER — SENNOSIDES-DOCUSATE SODIUM 8.6-50 MG PO TABS
1.0000 | ORAL_TABLET | Freq: Two times a day (BID) | ORAL | Status: DC
  Administered 2022-01-04 – 2022-01-06 (×5): 1 via ORAL
  Filled 2022-01-04 (×5): qty 1

## 2022-01-04 NOTE — Progress Notes (Signed)
Patient ID: Elizabeth Melton, female   DOB: Mar 05, 1941, 81 y.o.   MRN: [id number]  PROGRESS NOTE    NIKE SOUTHERS  GDJ:[id number] DOB: August 19, 1941 DOA: 12/28/2021 PCP: Kathyrn Lass, MD   Brief Narrative:  81 year old female with history of COPD, interstitial lung disease, chronic respiratory failure with hypoxia on 4 to 5 L oxygen via nasal cannula at home, severe pulmonary hypertension, chronic diastolic CHF, diabetes mellitus type 2, hypertension, paroxysmal A. fib, history of pulmonary embolism on Eliquis, PAD presented with worsening shortness of breath.  EMS found her with oxygen saturations of 85% on 5 L, she was placed on BiPAP and transferred to the ED.  She had missed few doses of diuretics at home.  In ED, chest x-ray showed chronic findings; labs were unremarkable.  Pulmonary and cardiology were consulted.  She was started on IV Lasix with modest response.  PT recommended SNF placement.  Assessment & Plan:   Acute on chronic hypoxic respiratory failure Acute on chronic diastolic heart failure Severe pulmonary artery hypertension -Recent right heart cath on 11/23/2021 showed normal filling pressures, severe PAH and with low cardiac output.  Plan was to add Tyvaso as an outpatient. -Echo in 11/202 showed EF of 60 to 65% with moderate TR, PASP of 85 mmHg -Missed few doses of diuretics at home -Heart failure team following.  Currently on torsemide 40 mg twice a day.  Continue metoprolol succinate.  Strict input and output.  Daily weights.  Fluid restriction.  Negative balance of 8 L since presentation -Currently on 6-7 L oxygen via nasal cannula.  Normally wears 4 L oxygen at home.  Wean off as able. -Pulmonary evaluated the patient and signed off: ILD progression palpable slightly.  Steroids discontinued.  Outpatient follow-up with pulmonary -Overall prognosis is guarded to poor.  Will consult palliative care for goals of care discussion  Acute on low chronic back pain -Appears  musculoskeletal; no history of injury or fall -Lumbar spine and sacrum/coccyx x-rays on 01/03/2022 showed no fractures; showed first-degree anterolisthesis at L4-L5 level along with disc space narrowing. -Continue Voltaren gel, Robaxin and lidocaine patch. -Continue physical therapy. -Will need SNF placement  AKI on CKD stage IIIa -creatinine currently stable; 1.38 today.  Monitor  Hyponatremia -Mild.  Monitor.  Paroxysmal A. Fib -Currently rate controlled.  Continue Toprol-XL and Eliquis  COPD Interstitial lung disease -Outpatient follow-up with pulmonary.  Considering antifibrotic therapy as an outpatient.  Tobacco abuse -Continue nicotine patch.  Essential hypertension -Blood pressure stable.  Continue Toprol-XL and diuretics  Diabetes mellitus type 2 with hypoglycemia -A1c 7.6 in 10/2021.  Continue long-acting insulin along with CBGs with SSI  Hypothyroidism -Continue levothyroxine  Hyperlipidemia/PAD -Continue pravastatin   DVT prophylaxis: Eliquis Code Status: Full Family Communication: None at bedside Disposition Plan: Status is: Inpatient  Remains inpatient appropriate because: Of need for SNF placement   Consultants: Heart failure team/pulmonary  Procedures: None  Antimicrobials: None   Subjective: Patient seen and examined at bedside.  Poor historian.  Still short of breath with exertion.  No overnight fever, vomiting, abdominal pain reported.  Objective: Vitals:   01/03/22 2337 01/04/22 0442 01/04/22 0740 01/04/22 0855  BP: 130/86 (!) 114/51 116/75 (!) 118/50  Pulse: 64 (!) 56 65 68  Resp: _0 Temp: 97.7 F (36.5 C) 97.8 F (36.6 C) 98.2 F (36.8 C)   TempSrc: Oral Oral Oral   SpO2: 96% 94% 92% 91%  Weight:  73 kg    Height:  Intake/Output Summary (Last 24 hours) at 01/04/2022 0935 Last data filed at 01/04/2022 0855 Gross per 24 hour  Intake 370 ml  Output 2950 ml  Net -2580 ml   Filed Weights   01/02/22 0623  01/03/22 0120 01/04/22 0442  Weight: 76.1 kg 74.4 kg 73 kg    Examination:  General exam: Appears chronically ill and deconditioned.  Currently on 7 L oxygen by nasal cannula. Respiratory system: Bilateral decreased breath sounds at bases with scattered crackles Cardiovascular system: S1 & S2 heard; intermittently bradycardic Gastrointestinal system: Abdomen is nondistended, soft and nontender. Normal bowel sounds heard. Extremities: No cyanosis, clubbing; trace lower extremity edema present  Central nervous system: Awake; slow to respond.  Poor historian.  No focal neurological deficits. Moving extremities Skin: No rashes, lesions or ulcers Psychiatry: Affect is mostly flat.   Data Reviewed: I have personally reviewed following labs and imaging studies  CBC: Recent Labs  Lab 12/28/21 1217 12/29/21 0331 12/30/21 0320 01/02/22 0422 01/03/22 0401  WBC 8.7 6.1 13.1* 8.9 8.4  NEUTROABS 6.6  --   --   --   --   HGB 14.3 14.0 14.3 15.6* 14.9  HCT 46.0 41.9 43.5 48.6* 44.8  MCV 106.2* 102.2* 102.6* 102.7* 101.8*  PLT 160 132* 145* 155 161   Basic Metabolic Panel: Recent Labs  Lab 12/30/21 0320 12/31/21 0915 01/01/22 0150 01/02/22 0422 01/03/22 0401 01/04/22 0452  NA 136 139 138 133* 133* 133*  K 4.1 4.3 4.3 4.3 4.4 4.1  CL 104 105 102 101 99 97*  CO2 _0 GLUCOSE 176* 192* 127* 202* 134* 103*  BUN 31* 31* 29* 35* 33* 32*  CREATININE 1.30* 1.26* 1.16* 1.28* 1.35* 1.38*  CALCIUM 8.7* 8.9 8.7* 8.3* 8.3* 8.7*  MG 2.3  --  2.7*  --   --   --    GFR: Estimated Creatinine Clearance: 33.8 mL/min (A) (by C-G formula based on SCr of 1.38 mg/dL (H)). Liver Function Tests: Recent Labs  Lab 12/28/21 1315  AST 38  ALT 21  ALKPHOS 54  BILITOT 1.4*  PROT 7.5  ALBUMIN 3.3*   No results for input(s): LIPASE, AMYLASE in the last 168 hours. No results for input(s): AMMONIA in the last 168 hours. Coagulation Profile: No results for input(s): INR, PROTIME in the  last 168 hours. Cardiac Enzymes: No results for input(s): CKTOTAL, CKMB, CKMBINDEX, TROPONINI in the last 168 hours. BNP (last 3 results) No results for input(s): PROBNP in the last 8760 hours. HbA1C: No results for input(s): HGBA1C in the last 72 hours. CBG: Recent Labs  Lab 01/03/22 1117 01/03/22 1619 01/03/22 2105 01/04/22 0456 01/04/22 0600  GLUCAP 244* 108* 155* 108* 127*   Lipid Profile: No results for input(s): CHOL, HDL, LDLCALC, TRIG, CHOLHDL, LDLDIRECT in the last 72 hours. Thyroid Function Tests: No results for input(s): TSH, T4TOTAL, FREET4, T3FREE, THYROIDAB in the last 72 hours. Anemia Panel: No results for input(s): VITAMINB12, FOLATE, FERRITIN, TIBC, IRON, RETICCTPCT in the last 72 hours. Sepsis Labs: Recent Labs  Lab 12/30/21 0320  PROCALCITON <0.10    Recent Results (from the past 240 hour(s))  Resp Panel by RT-PCR (Flu A&B, Covid) Nasopharyngeal Swab     Status: None   Collection Time: 12/28/21  3:50 PM   Specimen: Nasopharyngeal Swab; Nasopharyngeal(NP) swabs in vial transport medium  Result Value Ref Range Status   SARS Coronavirus 2 by RT PCR NEGATIVE NEGATIVE Final    Comment: (NOTE) SARS-CoV-2 target nucleic  acids are NOT DETECTED.  The SARS-CoV-2 RNA is generally detectable in upper respiratory specimens during the acute phase of infection. The lowest concentration of SARS-CoV-2 viral copies this assay can detect is 138 copies/mL. A negative result does not preclude SARS-Cov-2 infection and should not be used as the sole basis for treatment or other patient management decisions. A negative result may occur with  improper specimen collection/handling, submission of specimen other than nasopharyngeal swab, presence of viral mutation(s) within the areas targeted by this assay, and inadequate number of viral copies(<138 copies/mL). A negative result must be combined with clinical observations, patient history, and epidemiological information. The  expected result is Negative.  Fact Sheet for Patients:  EntrepreneurPulse.com.au  Fact Sheet for Healthcare Providers:  IncredibleEmployment.be  This test is no t yet approved or cleared by the [place] FDA and  has been authorized for detection and/or diagnosis of SARS-CoV-2 by FDA under an Emergency Use Authorization (EUA). This EUA will remain  in effect (meaning this test can be used) for the duration of the COVID-19 declaration under Section 564(b)(1) of the Act, 21 U.S.C.section 360bbb-3(b)(1), unless the authorization is terminated  or revoked sooner.       Influenza A by PCR NEGATIVE NEGATIVE Final   Influenza B by PCR NEGATIVE NEGATIVE Final    Comment: (NOTE) The Xpert Xpress SARS-CoV-2/FLU/RSV plus assay is intended as an aid in the diagnosis of influenza from Nasopharyngeal swab specimens and should not be used as a sole basis for treatment. Nasal washings and aspirates are unacceptable for Xpert Xpress SARS-CoV-2/FLU/RSV testing.  Fact Sheet for Patients: EntrepreneurPulse.com.au  Fact Sheet for Healthcare Providers: IncredibleEmployment.be  This test is not yet approved or cleared by the [place] FDA and has been authorized for detection and/or diagnosis of SARS-CoV-2 by FDA under an Emergency Use Authorization (EUA). This EUA will remain in effect (meaning this test can be used) for the duration of the COVID-19 declaration under Section 564(b)(1) of the Act, 21 U.S.C. section 360bbb-3(b)(1), unless the authorization is terminated or revoked.  Performed at [facility], [place] [address], [place], [place] [address]          Radiology Studies: DG Lumbar Spine 2-3 Views  Result Date: 01/03/2022 CLINICAL DATA:  Back pain EXAM: LUMBAR SPINE - 2-3 VIEW COMPARISON:  None. FINDINGS: No recent fracture is seen. There is first-degree anterolisthesis at L4-L5 level. There is  disc space narrowing at the L4-L5 level. Degenerative changes are noted in facet joints, more so at L4-L5 and L5-S1 levels. Extensive arterial calcifications are seen. Calcifications in the right upper quadrant suggest gallbladder stones. Fibrotic changes are noted in the visualized lower lung fields. IMPRESSION: No recent fracture is seen. There is first-degree anterolisthesis at L4-L5 level along with disc space narrowing. Degenerative changes are noted in facet joints in the lower lumbar spine. Gallbladder stones. Pulmonary fibrosis.  Severe atherosclerotic disease. Electronically Signed   By: Elmer Picker M.D.   On: 01/03/2022 12:30   DG Sacrum/Coccyx  Result Date: 01/03/2022 CLINICAL DATA:  Low back pain EXAM: SACRUM AND COCCYX - 2+ VIEW COMPARISON:  None. FINDINGS: No recent fracture is seen. There is sclerosis adjacent to the SI joints, especially in the inferior aspect of right SI joint. Osteopenia is seen in bony structures. Vascular calcifications are seen. Coarse calcifications in the pelvis suggest possible calcified uterine fibroids. IMPRESSION: No recent fracture is seen. Osteopenia. Degenerative changes are noted in the SI joints, more so on the right side.  Electronically Signed   By: Elmer Picker M.D.   On: 01/03/2022 12:32        Scheduled Meds:  apixaban  5 mg Oral BID   arformoterol  15 mcg Nebulization BID   And   umeclidinium bromide  1 puff Inhalation Daily   budesonide (PULMICORT) nebulizer solution  0.25 mg Nebulization BID   buPROPion  150 mg Oral Daily   diclofenac Sodium  2 g Topical TID   guaiFENesin  600 mg Oral BID   insulin aspart  0-15 Units Subcutaneous TID WC   insulin glargine-yfgn  20 Units Subcutaneous Daily   ipratropium-albuterol  3 mL Nebulization BID   levothyroxine  75 mcg Oral QAC breakfast   lidocaine  2 patch Transdermal QHS   methocarbamol  1,000 mg Oral BID   metoprolol succinate  12.5 mg Oral Daily   nicotine  14 mg Transdermal  Daily   polyethylene glycol  17 g Oral Daily   pravastatin  40 mg Oral q1800   sodium chloride flush  3 mL Intravenous Q12H   torsemide  40 mg Oral BID   Continuous Infusions:  sodium chloride            Aline August, MD [facility] 01/04/2022, 9:35 AM

## 2022-01-04 NOTE — TOC Progression Note (Addendum)
Transition of Care [facility]) - Progression Note    Patient Details  Name: Elizabeth Melton MRN: [id number] Date of Birth: Mar 02, 1941  Transition of Care [facility]) CM/SW Contact  Darell Saputo, LCSW Phone Number: 01/04/2022, 10:04 AM  Clinical Narrative:    HF CSW reached out to the patient's daughter [name] [phone] and shared with her about her mother's insurance from the financial team that it appears the patient has switched her [place] is a new Medicare Advantage plan option in 2023. And that her mom no longer has [employer]- its showing ineligible. [place] is her new Medicare Advantage plan- ID# [id number]. Right now [facility] is the only option for rehab at this time with the current health insurance plan. [name] reported  she will talk with family and get back with the CSW. 10:11am - HF CSW heard back from [name] who reported that the [facility] insurance won't be reinstated until February 1st. [name] would like to visit the SNF before patient is discharged. CSW is still waiting to hear from Maudie Mercury at [employer] about the insurance authorization.  12:34pm - HF CSW reached out to [place] with [facility] [phone] to see about the insurance authorization and had to leave a voicemail for her to return the call. [name] is still on 7L O2. 12:56pm - HF CSW received a call back from [place] and they will order a 10L tank of oxygen for [name] they will need her to be weaned to her baseline of 5L. Maudie Mercury reported she will start the insurance authorization for [name].  CSW will continue to follow throughout discharge.    Expected Discharge Plan: [place] Barriers to Discharge: Continued Medical Work up  Expected Discharge Plan and Services Expected Discharge Plan: [place] In-house Referral: Clinical Social Work Discharge Planning Services: CM Consult Post Acute Care Choice: [place] arrangements for  the past 2 months: Apartment                   DME Agency: [employer] Date DME Agency Contacted: 12/30/21 Time DME Agency Contacted: 1600 Representative spoke with at DME Agency: [place] ([place]) Interventions    Readmission Risk Interventions Readmission Risk Prevention Plan 09/10/2020  Post Dischage Appt Complete  Medication Screening Complete  Transportation Screening Complete  Some recent data might be hidden   [name], MSW, LCSWA [phone] Heart Failure Social Worker

## 2022-01-04 NOTE — Progress Notes (Signed)
Mobility Specialist Progress Note:   01/04/22 1142  Mobility  Bed Position Chair  Activity Ambulated with assistance in hallway  Level of Assistance Standby assist, set-up cues, supervision of patient - no hands on  Assistive Device Four wheel walker  Distance Ambulated (ft) 220 ft  Activity Response Tolerated well  $Mobility charge 1 Mobility   Pt received in bed willing to participate in mobility. No complaints of pain and asymptomatic. Pt left in chair with call bell in reach and all needs met.   [facility] Public librarian Phone [phone] Secondary Phone [phone]

## 2022-01-04 NOTE — Consult Note (Signed)
Consultation Note Date: 01/04/2022   Patient Name: Elizabeth Melton  DOB: 11/13/1941  MRN: [id number]  Age / Sex: 81 y.o., female  PCP: Kathyrn Lass, MD Referring Physician: Aline August, MD  Reason for Consultation: Establishing goals of care and Psychosocial/spiritual support  HPI/Patient Profile: 81 y.o. 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   She woke up at 5:00am and had a hard time catching her breath. She couldn't even dress herself due to her shortness of breath. She called [facility] to bring her to the hospital. She had her home oxygen on at 5L at this time.  [facility] reported patient to be at 85% on her 5L. She typically is at 88-89%. She was placed on bipap and eventually weaned back to her home 5L. She has chronic leg swelling  She lives at home with a son  This is patient's third admission in the past 6 months.  Today's date 12 of this hospitalization.  She continue with SOB on minimal exertion.  Patient faces treatment option decisions, advanced directive decisions and anticipatory care needs.     Clinical Assessment and Goals of Care:  This NP Wadie Lessen reviewed medical records, received report from team, assessed the patient and then meet at the patient's bedside  to discuss diagnosis, prognosis, GOC, EOL wishes disposition and options.   Concept of Palliative Care was introduced as specialized medical care for people and their families living with serious illness.  If focuses on providing relief from the symptoms and stress of a serious illness.  The goal is to improve quality of life for both the patient and the  family.  Values and goals of care important to patient and family were attempted to be elicited.    Created space and opportunity for patient  to explore thoughts and feelings regarding current medical situation.  She realizes that her body is "getting tired",  she trusts in her faith to guide her with the next steps of her life.   A  discussion was had today regarding advanced directives.    Concepts specific to code status, artifical feeding and hydration, continued IV antibiotics and rehospitalization was had.  The difference between a aggressive medical intervention path  and a palliative comfort care path for this patient at this time was had.     She is open to suggestion for a family meeting for further discussion regarding her plan of care.  I will reach out to daughter to set something up.    Questions and concerns addressed.  Patient  encouraged to call with questions or concerns.     PMT will continue to support holistically.          No documented healthcare power of attorney or advanced care planning documents noted.  Patient tells me that her daughter [name] is her main support person and would be her decision-maker in the event that she could not speak for herself.                        SUMMARY OF RECOMMENDATIONS    Code Status/Advance Care Planning: Full code  Educated patient to consider DNR/DNI status understanding evidenced based poor outcomes in similar hospitalized patient, as the cause of arrest is likely associated with advanced chronic illness rather than an easily reversible acute cardio-pulmonary event.     Additional Recommendations (Limitations, Scope, Preferences): Full Scope Treatment  Psycho-social/Spiritual:  Desire for further Chaplaincy support:yes Additional Recommendations: Education on Hospice  Prognosis:  Unable to determine  Discharge Planning:  Patient is open to SNF for short term rehab.  TOC team is working on  this.   To Be Determined      Primary Diagnoses: Present on Admission:  Type 2 diabetes mellitus with hyperglycemia ([place])  Essential (primary) hypertension  ILD (interstitial lung disease) (HCC)  AF (paroxysmal atrial fibrillation) (HCC)  (Resolved) Chronic diastolic heart failure (HCC)  Hypothyroidism  Hyperlipidemia/PAD  (Resolved) Lower extremity pain  PAD (peripheral artery disease) (HCC)  (Resolved) Acute on chronic respiratory failure with hypoxemia (HCC)  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   Acute on chronic respiratory failure with hypoxia (HCC)  Tobacco abuse  Hyperkalemia  Acute respiratory failure ([place])   I have reviewed the medical record, interviewed the patient and family, and examined the patient. The following aspects are pertinent.  Past Medical History:  Diagnosis Date   Acute on chronic diastolic (congestive) heart failure ([place]) 05/19/2021   Acute respiratory failure with hypoxia (HCC) 09/06/2020   Atrial fibrillation with RVR (HCC)    Diabetes mellitus without complication ([place])    Hyperlipidemia    Hypertension    Hypothyroidism    IPF (idiopathic pulmonary fibrosis) ([place])    Pneumonia due to COVID-19 virus 01/16/2021   Thrombocytopenia (HCC)    Social History   Socioeconomic History   Marital status: Widowed    Spouse name: Not on file   Number of children: Not on file   Years of education: Not on file   Highest education level: Not on file  Occupational History   Occupation: retired  Tobacco Use   Smoking status: Every Day    Packs/day: 1.00    Years: 60.00    Pack years: 60.00    Types: Cigarettes    Start date: 12/11/1958   Smokeless tobacco: Former    Quit date: 09/24/1962   Tobacco comments:    3cigs per day as of 12/15/21 ep  Vaping Use   Vaping Use: Never used  Substance and Sexual Activity   Alcohol use: Yes    Comment: drinks beer 12 every 2 weeks   Drug use: No   Sexual activity: Not on  file  Other Topics Concern   Not on file  Social History Narrative   Not on file   Social Determinants of Health   Financial Resource Strain: Not on file  Food Insecurity: No Food Insecurity   Worried About [place] in the Last Year: Never true   [place] in the Last Year: Never true  Transportation Needs: No Transportation Needs   Lack of Transportation (Medical): No   Lack of Transportation (Non-Medical): No  Physical Activity: Not on file  Stress: Not on  file  Social Connections: Not on file   Family History  Problem Relation Age of Onset   Diabetes Mother    Kidney disease Mother    Hypertension Mother    Other Father        tuberculosis   Hypertension Sister    Hyperlipidemia Sister    Scheduled Meds:  apixaban  5 mg Oral BID   arformoterol  15 mcg Nebulization BID   And   umeclidinium bromide  1 puff Inhalation Daily   budesonide (PULMICORT) nebulizer solution  0.25 mg Nebulization BID   buPROPion  150 mg Oral Daily   diclofenac Sodium  2 g Topical TID   guaiFENesin  600 mg Oral BID   insulin aspart  0-15 Units Subcutaneous TID WC   insulin glargine-yfgn  20 Units Subcutaneous Daily   ipratropium-albuterol  3 mL Nebulization BID   levothyroxine  75 mcg Oral QAC breakfast   lidocaine  2 patch Transdermal QHS   methocarbamol  1,000 mg Oral BID   metoprolol succinate  12.5 mg Oral Daily   nicotine  14 mg Transdermal Daily   polyethylene glycol  17 g Oral Daily   pravastatin  40 mg Oral q1800   sodium chloride flush  3 mL Intravenous Q12H   torsemide  40 mg Oral BID   Continuous Infusions:  sodium chloride     PRN Meds:.sodium chloride, acetaminophen **OR** acetaminophen, albuterol, guaiFENesin-dextromethorphan, sodium chloride flush Medications Prior to Admission:  Prior to Admission medications   Medication Sig Start Date End Date Taking? Authorizing Provider  acetaminophen (TYLENOL) 500 MG tablet Take 1,000 mg by mouth every 8 (eight)  hours as needed for headache or mild pain (pain).   Yes [provider]  apixaban (ELIQUIS) 5 MG TABS tablet Take 1 tablet (5 mg total) by mouth 2 (two) times daily. 11/24/21  Yes Larey Dresser, MD  b complex vitamins tablet Take 1 tablet by mouth daily.   Yes [provider]  bisacodyl (DULCOLAX) 10 MG suppository Place 10 mg rectally daily as needed for moderate constipation.   Yes [provider]  buPROPion (WELLBUTRIN XL) 150 MG 24 hr tablet Take 1 tablet (150 mg total) by mouth daily. 12/20/21  Yes Larey Dresser, MD  Calcium Carb-Cholecalciferol (CALCIUM + D3) 600-200 MG-UNIT TABS Take 2 tablets by mouth daily.   Yes [provider]  diphenhydrAMINE (BENADRYL) 25 MG tablet Take 25 mg by mouth every 6 (six) hours as needed for allergies.   Yes [provider]  insulin glargine (LANTUS) 100 UNIT/ML injection Inject 10 Units into the skin daily.   Yes [provider]  levothyroxine (SYNTHROID, LEVOTHROID) 75 MCG tablet Take 75 mcg by mouth daily before breakfast.    Yes [provider]  lovastatin (MEVACOR) 40 MG tablet Take 40 mg by mouth at bedtime.   Yes [provider]  metoprolol succinate (TOPROL-XL) 25 MG 24 hr tablet Take 0.5 tablets (12.5 mg total) by mouth daily. 01/28/21  Yes [name], MD  nicotine (NICODERM CQ - DOSED IN MG/24 HOURS) 14 mg/24hr patch Place 14 mg onto the skin daily.   Yes [provider]  Omega-3 Fatty Acids (FISH OIL) 1000 MG CAPS Take 1,000 mg by mouth daily.   Yes [provider]  OXYGEN Inhale 5 L into the lungs continuous.   Yes [provider]  potassium chloride (KLOR-CON) 10 MEQ tablet Take 2 tablets (20 mEq total) by mouth daily. 11/18/21  Yes Aundra Dubin,  Elby Showers, MD  Tiotropium Bromide-Olodaterol (STIOLTO RESPIMAT) 2.5-2.5 MCG/ACT AERS Inhale 2 puffs into the lungs daily.   Yes [provider]  Torsemide 40 MG TABS Take 20-40 tablets by mouth See  admin instructions. Takes 40 mg in the morning and 20 mg at night   Yes [provider]  VITAMIN D PO Take 1,200 Units by mouth daily.   Yes [provider]  Blood Glucose Monitoring Suppl (TRUE METRIX METER) w/Device KIT 1 each by Other route in the morning and at bedtime. 12/25/20   [provider]  glucose blood test strip 1 each by Other route as needed for other (blood sugar).    [provider]  Insulin Pen Needle (PEN NEEDLES 29GX1/2") 29G X 12MM MISC For insulin injection 05/22/21   [name], MD  SMART SENSE THIN LANCETS 26G MISC 1 each by Does not apply route 2 (two) times daily.    [provider]   Allergies  Allergen Reactions   Lisinopril Other (See Comments)    Other reaction(s): cough 09/20/17   Review of Systems  Constitutional:  Positive for fatigue.  Respiratory:  Positive for shortness of breath.   Neurological:  Positive for weakness.   Physical Exam Cardiovascular:     Rate and Rhythm: Normal rate.  Pulmonary:     Effort: Pulmonary effort is normal.  Skin:    General: Skin is warm and dry.  Neurological:     Mental Status: She is alert and oriented to person, place, and time.    Vital Signs: BP (!) 118/50 (BP Location: Right Arm)    Pulse 68    Temp 98.2 F (36.8 C) (Oral)    Resp 20    Ht _0  (1.676 m)    Wt 73 kg    SpO2 91%    BMI 25.98 kg/m  Pain Scale: 0-10   Pain Score: 3    SpO2: SpO2: 91 % O2 Device:SpO2: 91 % O2 Flow Rate: .O2 Flow Rate (L/min): 7 L/min  IO: Intake/output summary:  Intake/Output Summary (Last 24 hours) at 01/04/2022 1537 Last data filed at 01/04/2022 1329 Gross per 24 hour  Intake 610 ml  Output 3200 ml  Net -2590 ml    LBM: Last BM Date: 12/31/21 Baseline Weight: Weight: 75.7 kg Most recent weight: Weight: 73 kg     Palliative Assessment/Data:    MDM-high  Signed by: Wadie Lessen, NP   Please contact Palliative Medicine Team phone at [phone] for questions and  concerns.  For individual provider: See Shea Evans

## 2022-01-04 NOTE — Plan of Care (Signed)
Problem: Education: Goal: Ability to demonstrate management of disease process will improve Outcome: Progressing   Problem: Activity: Goal: Capacity to carry out activities will improve Outcome: Progressing   Problem: Education: Goal: Knowledge of General Education information will improve Description: Including pain rating scale, medication(s)/side effects and non-pharmacologic comfort measures Outcome: Progressing   Problem: Health Behavior/Discharge Planning: Goal: Ability to manage health-related needs will improve Outcome: Progressing

## 2022-01-04 NOTE — Progress Notes (Addendum)
Cardiology Rounding Note  PCP-Cardiologist: [name], MD   Subjective:    Continues on 40 mg Torsemide BID. 2.5 L UOP charted yesterday. Weight down another 4 lb.   Scr stable, 1.38  Remains on 6L O2 [place]  Continues to have significant back pain. Hurts to stand.   Denies dyspnea at rest. No orthopnea or PND.  Objective:   Weight Range: 73 kg Body mass index is 25.98 kg/m.   Vital Signs:   Temp:  [97.7 F (36.5 C)-98.9 F (37.2 C)] 97.8 F (36.6 C) (01/25 0442) Pulse Rate:  [35-69] 56 (01/25 0442) Resp:  [15-26] 20 (01/25 0442) BP: (113-134)/(49-86) 114/51 (01/25 0442) SpO2:  [88 %-97 %] 94 % (01/25 0442) Weight:  [73 kg] 73 kg (01/25 0442) Last BM Date: 12/31/21  Weight change: Filed Weights   01/02/22 0623 01/03/22 0120 01/04/22 0442  Weight: 76.1 kg 74.4 kg 73 kg    Intake/Output:   Intake/Output Summary (Last 24 hours) at 01/04/2022 0733 Last data filed at 01/04/2022 0442 Gross per 24 hour  Intake 600 ml  Output 2450 ml  Net -1850 ml      Physical Exam  General:  Elderly female sitting up in bed HEENT: normal Neck: supple. no JVD. Carotids 2+ bilat; no bruits. No lymphadenopathy or thryomegaly appreciated. Cor: PMI nondisplaced. Regular rate & rhythm. No rubs, gallops or murmurs. Lungs: scattered expiratory wheezes Abdomen: soft, nontender, nondistended. No hepatosplenomegaly. No bruits or masses. Good bowel sounds. Extremities: no cyanosis, clubbing, rash, edema Neuro: alert & orientedx3, cranial nerves grossly intact. moves all 4 extremities w/o difficulty. Affect pleasant   Telemetry   SR/sinus brady 50s-60s, PVCs (~ 5/min)  Labs    CBC Recent Labs    01/02/22 0422 01/03/22 0401  WBC 8.9 8.4  HGB 15.6* 14.9  HCT 48.6* 44.8  MCV 102.7* 101.8*  PLT 155 574   Basic Metabolic Panel Recent Labs    01/03/22 0401 01/04/22 0452  NA 133* 133*  K 4.4 4.1  CL 99 97*  CO2 24 27  GLUCOSE 134* 103*  BUN 33* 32*   CREATININE 1.35* 1.38*  CALCIUM 8.3* 8.7*   Liver Function Tests No results for input(s): AST, ALT, ALKPHOS, BILITOT, PROT, ALBUMIN in the last 72 hours.  No results for input(s): LIPASE, AMYLASE in the last 72 hours. Cardiac Enzymes No results for input(s): CKTOTAL, CKMB, CKMBINDEX, TROPONINI in the last 72 hours.  BNP: BNP (last 3 results) Recent Labs    11/18/21 1610 12/28/21 1218 12/29/21 0331  BNP 241.9* 565.2* 821.0*    ProBNP (last 3 results) No results for input(s): PROBNP in the last 8760 hours.   D-Dimer No results for input(s): DDIMER in the last 72 hours. Hemoglobin A1C No results for input(s): HGBA1C in the last 72 hours. Fasting Lipid Panel No results for input(s): CHOL, HDL, LDLCALC, TRIG, CHOLHDL, LDLDIRECT in the last 72 hours. Thyroid Function Tests No results for input(s): TSH, T4TOTAL, T3FREE, THYROIDAB in the last 72 hours.  Invalid input(s): FREET3  Other results:   Imaging    DG Lumbar Spine 2-3 Views  Result Date: 01/03/2022 CLINICAL DATA:  Back pain EXAM: LUMBAR SPINE - 2-3 VIEW COMPARISON:  None. FINDINGS: No recent fracture is seen. There is first-degree anterolisthesis at L4-L5 level. There is disc space narrowing at the L4-L5 level. Degenerative changes are noted in facet joints, more so at L4-L5 and L5-S1 levels. Extensive arterial calcifications are seen. Calcifications in the right upper quadrant suggest  gallbladder stones. Fibrotic changes are noted in the visualized lower lung fields. IMPRESSION: No recent fracture is seen. There is first-degree anterolisthesis at L4-L5 level along with disc space narrowing. Degenerative changes are noted in facet joints in the lower lumbar spine. Gallbladder stones. Pulmonary fibrosis.  Severe atherosclerotic disease. Electronically Signed   By: Elmer Picker M.D.   On: 01/03/2022 12:30   DG Sacrum/Coccyx  Result Date: 01/03/2022 CLINICAL DATA:  Low back pain EXAM: SACRUM AND COCCYX - 2+ VIEW  COMPARISON:  None. FINDINGS: No recent fracture is seen. There is sclerosis adjacent to the SI joints, especially in the inferior aspect of right SI joint. Osteopenia is seen in bony structures. Vascular calcifications are seen. Coarse calcifications in the pelvis suggest possible calcified uterine fibroids. IMPRESSION: No recent fracture is seen. Osteopenia. Degenerative changes are noted in the SI joints, more so on the right side. Electronically Signed   By: Elmer Picker M.D.   On: 01/03/2022 12:32     Medications:     Scheduled Medications:  apixaban  5 mg Oral BID   arformoterol  15 mcg Nebulization BID   And   umeclidinium bromide  1 puff Inhalation Daily   budesonide (PULMICORT) nebulizer solution  0.25 mg Nebulization BID   buPROPion  150 mg Oral Daily   diclofenac Sodium  2 g Topical TID   guaiFENesin  600 mg Oral BID   insulin aspart  0-15 Units Subcutaneous TID WC   insulin glargine-yfgn  20 Units Subcutaneous Daily   ipratropium-albuterol  3 mL Nebulization BID   levothyroxine  75 mcg Oral QAC breakfast   lidocaine  2 patch Transdermal QHS   methocarbamol  1,000 mg Oral BID   metoprolol succinate  12.5 mg Oral Daily   nicotine  14 mg Transdermal Daily   polyethylene glycol  17 g Oral Daily   pravastatin  40 mg Oral q1800   sodium chloride flush  3 mL Intravenous Q12H   torsemide  40 mg Oral BID    Infusions:  sodium chloride      PRN Medications: sodium chloride, acetaminophen **OR** acetaminophen, albuterol, guaiFENesin-dextromethorphan, sodium chloride flush    Patient Profile   Mr Kivett is a 81 year old with a history of PE, PAF, ILD, COPD, and diastolic CHF with RV failure, and HFpEF.    Admitted with A/C respiratory failure + A/C HFpEF  Assessment/Plan   1. A/C Hypoxic Respiratory Failure - On 5 liters at home. Placed on Bipap in the ED and had weaned back to 5 liters. - On 6 L HFNC - Multifactorial (ILD, PAH and A/c CHF) - c/w nebs/  bronchodilators   2. A/C HFpEF---> RV Failure Echo in 11/22 showed EF 60-65%, mildly decreased RV systolic function with mild RV enlargement, mild MR, moderate TR, PASP 85 mmHg, mild aortic stenosis and mild aortic insufficiency, IVC dilated. The RV failure may be primarily due to pulmonary hypertension. RHC in 12/22 showed normal filling pressures with severe PH. -Volume status stable. Continue torsemide 40 mg twice a day.    3. PAH -Pulmonary venous hypertension on 7/22 RHC.  However, last echo in 11/22 showed PASP up to 85 mmHg. No evidence for chronic PE on 9/22 V/Q scan.  RHC in 12/22 showed normal filling pressures with severe pulmonary arterial hypertension.  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    - With PH-ILD, a trial of Tyvaso-DPI planned. Tyvaso application pending.   -  Volume status looks ok.   4. PAF  -Maintaining SR.  -On eliquis 5 mg twice a day.    5. ILD - followed by Dr. [name]  - seen by pulmonary here, doubt acute flare. IV methylprednisolone stopped    6. DMII -On SSI   7. CKD Stage IIIa -Creatinine stable 1.38 -Daily BMET  8.   Hypothyroidism -On Levothyroxine.    9.Tobacco Abuse -Last smoked 2 weeks ago.   10. Leukocytosis  - WBC resolved.   - Afebrile - Suspect 2/2 recent steroids  - Given higher O2 requirements, checked PCT to r/o PNA, was negative  11. Back pain   Mobilize   Disposition: SNF. SW following. Difficulty finding bed.    Length of Stay: 6  [facility], LINDSAY N, PA-C  01/04/2022, 7:33 AM   Patient seen with PA, agree with the above note.   Stable creatinine on torsemide 40 mg bid.  Remains on 6 L HFNC.  Still with significant back pain.   General: NAD Neck: JVP 8 cm, no thyromegaly or thyroid nodule.  Lungs: Dry crackles.  CV: Nondisplaced PMI.  Heart regular S1/S2, no S3/S4, no murmur.  No peripheral edema.   Abdomen: Soft, nontender, no hepatosplenomegaly, no distention.  Skin:  Intact without lesions or rashes.  Neurologic: Alert and oriented x 3.  Psych: Normal affect. Extremities: No clubbing or cyanosis.  HEENT: Normal.   Stable from cardiac perspective, continue current diuretic.  Creatinine ok.  Awaiting SNF.   Loralie Champagne 01/04/2022 8:57 AM

## 2022-01-04 NOTE — Progress Notes (Signed)
Mobility Specialist Progress Note: ° ° 01/04/22 1440  °Mobility  °Activity Ambulated with assistance in room  °Level of Assistance Minimal assist, patient does 75% or more  °Assistive Device  °(HHA)  °Distance Ambulated (ft) 4 ft  °Activity Response Tolerated well  °$Mobility charge 1 Mobility  ° °Pt in pain wanting to go back to bed. MinA to stand and step to bed. Left in bed with call bell in reach and all needs met.  ° °Elizabeth Melton °Mobility Specialist °Primary Phone [phone] °Secondary Phone [phone] ° °

## 2022-01-04 NOTE — Progress Notes (Signed)
Patient declined BIPAP tonight. Unit still at bedside if needed. Hasn't wore in two nights.

## 2022-01-04 NOTE — Progress Notes (Signed)
Physical Therapy Treatment Patient Details Name: Elizabeth Melton MRN: [id number] DOB: Apr 19, 1941 Today's Date: 01/04/2022   History of Present Illness 81 y.o. female was admitted 1/18 after recent admits with SOB and was hypoxic on home O2 of 5L.  Has new CPAP orders.  Pt is in CHF with diuresing and using purwick with significant low baseline sats 87-89%.  PMHx:  CHF, DM, HTN, HLD, hypothyroidism, PE, on ELiquis, pulm fibrosis on home O2, smoker, CKD, PAF, PAD, ILD, pulm HTN,    PT Comments    Pt progressing steadily toward goals, limited by poor oxygenation during activity.  Emphasis on transition to EOB, sit to stand, reinforced use of the Rollator, progression of gait stability with monitoring of SpO2 on 6L then 8L in attempt to keep Sats >88%    Recommendations for follow up therapy are one component of a multi-disciplinary discharge planning process, led by the attending physician.  Recommendations may be updated based on patient status, additional functional criteria and insurance authorization.  Follow Up Recommendations  Skilled nursing-short term rehab (<3 hours/day)     Assistance Recommended at Discharge    Patient can return home with the following A little help with walking and/or transfers;A little help with bathing/dressing/bathroom;Assistance with cooking/housework;Assist for transportation;Help with stairs or ramp for entrance   Equipment Recommendations  None recommended by PT    Recommendations for Other Services       Precautions / Restrictions Precautions Precautions: Fall     Mobility  Bed Mobility Overal bed mobility: Needs Assistance Bed Mobility: Supine to Sit     Supine to sit: Min assist          Transfers Overall transfer level: Needs assistance   Transfers: Sit to/from Stand Sit to Stand: Min assist                Ambulation/Gait Ambulation/Gait assistance: Min guard Gait Distance (Feet): 200 Feet Assistive device: Rollator  (4 wheels) Gait Pattern/deviations: Step-through pattern Gait velocity: reduced Gait velocity interpretation: <1.31 ft/sec, indicative of household ambulator   General Gait Details: wider BOS, cues for proximity to rollator.  Sats on 6L [place] dropping to mid 80's, on 8L [place] higher 80's.  HR in the 100's bpm   Stairs             Wheelchair Mobility    Modified Rankin (Stroke Patients Only)       Balance     Sitting balance-Leahy Scale: Fair       Standing balance-Leahy Scale: Poor                              Cognition Arousal/Alertness: Awake/alert Behavior During Therapy: WFL for tasks assessed/performed Overall Cognitive Status: Within Functional Limits for tasks assessed                                          Exercises      General Comments General comments (skin integrity, edema, etc.): On return to room, pt went to chair and pt removed [place] without therapists knowledge and SpO2 dropped steadily to 67%      Pertinent Vitals/Pain Pain Assessment Pain Assessment: Faces Faces Pain Scale: Hurts little more Pain Location: back Pain Descriptors / Indicators: Aching, Grimacing, Guarding Pain Intervention(s): Monitored during session    Home Living  Prior Function            PT Goals (current goals can now be found in the care plan section) Acute Rehab PT Goals Patient Stated Goal: to get home and maybe have help with EMT's for stairs to enter house PT Goal Formulation: With patient/family Time For Goal Achievement: 01/16/22 Potential to Achieve Goals: Good Progress towards PT goals: Progressing toward goals    Frequency    Min 3X/week      PT Plan Current plan remains appropriate    Co-evaluation              AM-PAC PT "6 Clicks" Mobility   Outcome Measure  Help needed turning from your back to your side while in a flat bed without using bedrails?: A Little Help needed  moving from lying on your back to sitting on the side of a flat bed without using bedrails?: A Little Help needed moving to and from a bed to a chair (including a wheelchair)?: A Little Help needed standing up from a chair using your arms (e.g., wheelchair or bedside chair)?: A Little Help needed to walk in hospital room?: A Little Help needed climbing 3-5 steps with a railing? : Total 6 Click Score: 16    End of Session Equipment Utilized During Treatment: Oxygen Activity Tolerance: Patient limited by pain;Other (comment) Patient left: in bed;with call bell/phone within reach Nurse Communication: Mobility status PT Visit Diagnosis: Unsteadiness on feet (R26.81);Muscle weakness (generalized) (M62.81);Difficulty in walking, not elsewhere classified (R26.2)     Time: 4174-0814 PT Time Calculation (min) (ACUTE ONLY): 25 min  Charges:  $Gait Training: 8-22 mins $Therapeutic Activity: 8-22 mins                     01/04/2022  Elizabeth Carne., PT Acute Rehabilitation Services [phone]  (pager) [phone]  (office)   Elizabeth Melton 01/04/2022, 6:40 PM

## 2022-01-05 ENCOUNTER — Inpatient Hospital Stay (HOSPITAL_COMMUNITY): Payer: No Typology Code available for payment source

## 2022-01-05 ENCOUNTER — Encounter (HOSPITAL_COMMUNITY): Payer: No Typology Code available for payment source

## 2022-01-05 DIAGNOSIS — J9621 Acute and chronic respiratory failure with hypoxia: Secondary | ICD-10-CM | POA: Diagnosis not present

## 2022-01-05 DIAGNOSIS — Z515 Encounter for palliative care: Secondary | ICD-10-CM

## 2022-01-05 DIAGNOSIS — N179 Acute kidney failure, unspecified: Secondary | ICD-10-CM | POA: Diagnosis not present

## 2022-01-05 DIAGNOSIS — I5033 Acute on chronic diastolic (congestive) heart failure: Secondary | ICD-10-CM | POA: Diagnosis not present

## 2022-01-05 DIAGNOSIS — I272 Pulmonary hypertension, unspecified: Secondary | ICD-10-CM | POA: Diagnosis not present

## 2022-01-05 DIAGNOSIS — I480 Paroxysmal atrial fibrillation: Secondary | ICD-10-CM | POA: Diagnosis not present

## 2022-01-05 LAB — GLUCOSE, CAPILLARY
Glucose-Capillary: 213 mg/dL — ABNORMAL HIGH (ref 70–99)
Glucose-Capillary: 150 mg/dL — ABNORMAL HIGH (ref 70–99)
Glucose-Capillary: 183 mg/dL — ABNORMAL HIGH (ref 70–99)
Glucose-Capillary: 175 mg/dL — ABNORMAL HIGH (ref 70–99)

## 2022-01-05 LAB — MAGNESIUM: Magnesium: 2.3 mg/dL (ref 1.7–2.4)

## 2022-01-05 LAB — EXPECTORATED SPUTUM ASSESSMENT W GRAM STAIN, RFLX TO RESP C

## 2022-01-05 LAB — BASIC METABOLIC PANEL
Anion gap: 10 (ref 5–15)
BUN: 47 mg/dL — ABNORMAL HIGH (ref 8–23)
CO2: 25 mmol/L (ref 22–32)
Calcium: 8.7 mg/dL — ABNORMAL LOW (ref 8.9–10.3)
Chloride: 99 mmol/L (ref 98–111)
Creatinine, Ser: 1.74 mg/dL — ABNORMAL HIGH (ref 0.44–1.00)
GFR, Estimated: 29 mL/min — ABNORMAL LOW (ref >60)
Glucose, Bld: 209 mg/dL — ABNORMAL HIGH (ref 70–99)
Potassium: 4.1 mmol/L (ref 3.5–5.1)
Sodium: 134 mmol/L — ABNORMAL LOW (ref 135–145)

## 2022-01-05 LAB — CBC
HCT: 48.4 % — ABNORMAL HIGH (ref 36.0–46.0)
Hemoglobin: 15.7 g/dL — ABNORMAL HIGH (ref 12.0–15.0)
MCH: 33.4 pg (ref 26.0–34.0)
MCHC: 32.4 g/dL (ref 30.0–36.0)
MCV: 103 fL — ABNORMAL HIGH (ref 80.0–100.0)
Platelets: 206 10*3/uL (ref 150–400)
RBC: 4.7 MIL/uL (ref 3.87–5.11)
RDW: 16.8 % — ABNORMAL HIGH (ref 11.5–15.5)
WBC: 8.2 10*3/uL (ref 4.0–10.5)
nRBC: 0 % (ref 0.0–0.2)

## 2022-01-05 LAB — PROCALCITONIN: Procalcitonin: <0.1 ng/mL

## 2022-01-05 MED ORDER — PREDNISONE 20 MG PO TABS
20.0000 mg | ORAL_TABLET | Freq: Every day | ORAL | Status: AC
Start: 2022-01-06 — End: 2022-01-10
  Administered 2022-01-06 – 2022-01-10 (×5): 20 mg via ORAL
  Filled 2022-01-05 (×6): qty 1

## 2022-01-05 MED ORDER — SODIUM CHLORIDE 0.9 % IV SOLN: 250.0000 mL | INTRAVENOUS | Status: DC | PRN

## 2022-01-05 MED ORDER — SODIUM CHLORIDE 0.9 % IV SOLN: INTRAVENOUS | Status: DC

## 2022-01-05 MED ORDER — SODIUM CHLORIDE 0.9% FLUSH
3.0000 mL | Freq: Two times a day (BID) | INTRAVENOUS | Status: DC
  Administered 2022-01-05: 3 mL via INTRAVENOUS

## 2022-01-05 MED ORDER — ASPIRIN 81 MG PO CHEW
81.0000 mg | CHEWABLE_TABLET | ORAL | Status: AC
  Administered 2022-01-06: 81 mg via ORAL
  Filled 2022-01-05: qty 1

## 2022-01-05 MED ORDER — SODIUM CHLORIDE 0.9% FLUSH: 3.0000 mL | INTRAVENOUS | Status: DC | PRN

## 2022-01-05 MED ORDER — DOXYCYCLINE HYCLATE 100 MG PO TABS
100.0000 mg | ORAL_TABLET | Freq: Two times a day (BID) | ORAL | Status: DC
  Administered 2022-01-05 – 2022-01-13 (×17): 100 mg via ORAL
  Filled 2022-01-05 (×17): qty 1

## 2022-01-05 MED ORDER — ALUM & MAG HYDROXIDE-SIMETH 200-200-20 MG/5ML PO SUSP
30.0000 mL | Freq: Two times a day (BID) | ORAL | Status: DC | PRN
  Administered 2022-01-05: 30 mL via ORAL
  Filled 2022-01-05 (×2): qty 30

## 2022-01-05 NOTE — Progress Notes (Signed)
This chaplain responded to PMT consult for spiritual care. The chaplain understands the Pt. has a deep faith and trust in God's presence in her life.  The Pt. clarifies with the chaplain, "I know I have heart and lung disease, if the diseases are untreatable, I know God will take care of me and I am okay with that."  The Pt. adds she has the extra layer of family-sons and daughters, loving and supporting her.  The chaplain observes the Pt. experiencing a spontaneous shooting pain with her ongoing "burning" pain. The chaplain understands the pain prevents the Pt. comfort during the visit. The Pt. describes the shooting pain as a "10". The Pt. shares she has a lack of appetite today that accompanies the tastelessness of the hospital food.    The Pt. is appreciative of the medical team's care. The chaplain understands the Pt. "was not expecting the severity of her illnesses at the beginning of 2023."  The Pt. accepted F/U spiritual care and prayer from the chaplain.  Chaplain Sallyanne Kuster [phone]

## 2022-01-05 NOTE — Progress Notes (Signed)
Patient requested warm prune juice for constipation. Patient received scheduled sennokot and suppository and refused miralax throughout the shift. Patient has sat on [facility] for 35 mins and has refused to come off [facility] and verbalize to RN "I will not come off BSC until I have the BM". RN provided warm prune juice.    1820: Patient did not have BM, RN transferred back to bed. New purewick applied.

## 2022-01-05 NOTE — Plan of Care (Signed)
Problem: Education: Goal: Ability to demonstrate management of disease process will improve Outcome: Progressing Goal: Ability to verbalize understanding of medication therapies will improve Outcome: Progressing Goal: Individualized Educational Video(s) Outcome: Progressing

## 2022-01-05 NOTE — Progress Notes (Signed)
Mobility Specialist Progress Note:   01/05/22 1603  Mobility  Activity Ambulated with assistance in room  Level of Assistance Minimal assist, patient does 75% or more  Assistive Device  (HHA)  Distance Ambulated (ft) 20 ft  Activity Response Tolerated well  $Mobility charge 1 Mobility   Pt complained of back pain. Left in bed with call bell in reach and all needs met.   [facility] Public librarian Phone [phone] Secondary Phone [phone]

## 2022-01-05 NOTE — Progress Notes (Signed)
NAME:  Elizabeth Melton, MRN:  [id number], DOB:  May 10, 1941, LOS: 1 ADMISSION DATE:  12/28/2021, CONSULTATION DATE:  1/19 REFERRING MD:  Dr. Broadus John, CHIEF COMPLAINT:  Acute respiratory failure w/ hypoxia   History of Present Illness:  Patient is a 81 year-old female with pertinent PMH chronic diastolic CHF (patient of Dr. Aundra Dubin), severe pulm hypertension, chronic respiratory failure, COPD, ILD (patient of Dr. [name]) on 5L O2 presents to [facility] on 1/18 with respiratory distress.  On 1/18 admitted to [facility] ED.  Sats initially 85% on 5L [place].  Her sats are typically 88 to 89%.  Patient placed on BiPAP.  States she missed some of her water pills.  Denies fever.  BNP 565.  CXR showing chronic lung injury.  Lasix given.  Albuterol neb given.  Initially no signs of eczema/ILD flare.  Admitted to [facility] on the floors.   On 1/19 patient continuing to have desaturation episodes and requiring BiPAP more often.  Patient now on [place] HFNC 8L with sats 90%.  Questionable ILD is cause of respiratory distress.  PCCM asked for pulm consult.  Continuing diuresis with slight upward trend to his BUN/creatinine   Pertinent  Medical History        Past Medical History:  Diagnosis Date   Acute on chronic diastolic (congestive) heart failure ([place]) 05/19/2021   Acute respiratory failure with hypoxia ([facility]) 09/06/2020   Atrial fibrillation with RVR ([facility])     Diabetes mellitus without complication ([facility])     Hyperlipidemia     Hypertension     Hypothyroidism     IPF (idiopathic pulmonary fibrosis) ([place])     Pneumonia due to COVID-19 virus 01/16/2021   Thrombocytopenia ([place])          Significant Hospital Events: Including procedures, antibiotic start and stop dates in addition to other pertinent events   1/18: admitted to [facility] 1/19: PCCM consulted for inability to wean from BiPAP 1/20: on salter HFNC 8L 1/26 increased to 9 L   Interim History / Subjective:  Denies any significant new symptoms apart from a new  cough which started since his been in the hospital Does not have any chest pain or chest discomfort Cough is productive of clear phlegm but is new  Objective   Blood pressure (!) 143/51, pulse 80, temperature (!) 97.3 F (36.3 C), temperature source Oral, resp. rate (!) 23, height _0  (1.676 m), weight 75.7 kg, SpO2 90 %.    >  FiO2 (%):  [40 %] 40 %      Intake/Output Summary (Last 24 hours) at 12/30/2021 0855 Last data filed at 12/30/2021 6237    Gross per 24 hour  Intake 960 ml  Output 2050 ml  Net -1090 ml           Filed Weights    12/28/21 2006 12/29/21 0513 12/30/21 0330  Weight: 75.5 kg 75.1 kg 75.7 kg      Examination: General: Elderly gentleman, does not appear to be in distress at rest HEENT: Moist oral mucosa Neuro: Alert and oriented x3 moving all extremities CV: S1-S2 appreciated PULM: Clear breath sounds bilaterally GI: s bowel sounds appreciated   CXR 1/18: diffuse interstitial thickening Last chest x-ray noted Last CT scan of his chest was 07/13/2021-reviewed by myself consistent with ILD  Pulmonary function test 08/02/2021-mild obstructive disease with severe reduction in diffusing capacity   Resolved Hospital Problem list       Assessment & Plan:   Acute hypoxemic respiratory failure Underlying  pulmonary fibrosis Acute on chronic diastolic congestive heart failure with biventricular failure -Has been tolerating diuresis but slight increase in his creatinine today -Tolerating BiPAP nightly  -still needs to be kept on the dry side  New cough with sputum production -Sputum for gram stain and cultures -Obtain chest x-ray -Start doxycycline -Short course of steroids -He has no leukocytosis, no fever-still prudent to try and treat this as a bronchitis/pneumonia  Severe pulmonary hypertension Right-sided heart failure -Being followed by the heart failure team  Chronic obstructive pulmonary disease with exacerbation Continue  bronchodilators  Interstitial lung disease -Follows up with Dr. [name] as outpatient-has appointment scheduled for 2/10 -Continue oxygen supplementation- -To follow-up as outpatient -to consider antifibrotic's as outpatient     Best Practice (right click and "Reselect all SmartList Selections" daily)    Diet/type: per primary DVT prophylaxis: per primary GI prophylaxis: per primary Lines: NA Foley:  NA Code Status:  full code Last date of multidisciplinary goals of care discussion [per primary]   Sherrilyn Rist, MD [place] PCCM Pager: See Shea Evans

## 2022-01-05 NOTE — Progress Notes (Signed)
Physical Therapy Treatment Patient Details Name: Elizabeth Melton MRN: [id number] DOB: 1941-06-29 Today's Date: 01/05/2022   History of Present Illness 81 y.o. female was admitted 1/18 after recent admits with SOB and was hypoxic on home O2 of 5L.  Has new CPAP orders.  Pt is in CHF with diuresing and using purwick with significant low baseline sats 87-89%.  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 The pt required 8L O2 for mobility this session with SpO2 88-94% when pleth is good. She was also limited to ~132f prior to needing seated rest (5 min) before continuing with mobility. The pt maintained SpO2 93-96% on 8L at rest following session, reports she feels better about her breathing and mobility now compared to admission.   Despite progress with gait endurance, the pt has 15 steps to enter and will benefit from skilled PT acutely to progress functional power, endurance, and strength to complete as this will be more taxing than ambulation on flat hallway surface. The pt also reports her current O2 delivery system max is 5L, and therefore may need new equipment to deliver the level of O2 she is currently on at rest/with mobility.   Gait Speed: 0.475m using rollator and with supervision and 8L O2. (Gait speed < 0.56m44mindicates increased risk of falls and dependence in ADLs)    Recommendations for follow up therapy are one component of a multi-disciplinary discharge planning process, led by the attending physician.  Recommendations may be updated based on patient status, additional functional criteria and insurance authorization.  Follow Up Recommendations  Skilled nursing-short term rehab (<3 hours/day)     Assistance Recommended at Discharge  Frequent or constant Supervision/Assistance  Patient can return home with the following A little help with walking and/or transfers;A little help with bathing/dressing/bathroom;Assistance with cooking/housework;Assist for transportation;Help with stairs or ramp for entrance   Equipment Recommendations  None recommended by PT    Recommendations for Other Services       Precautions / Restrictions Precautions Precautions: Fall Precaution Comments: pt on 8L O2 for mobility Restrictions Weight Bearing Restrictions: No     Mobility  Bed Mobility Overal bed mobility: Needs Assistance Bed Mobility: Supine to Sit     Supine to sit: Supervision     General bed mobility comments: increased time and effort, pt not needing physical assist to complete    Transfers Overall transfer level: Needs assistance Equipment used: Rollator (4 wheels) Transfers: Sit to/from Stand Sit to Stand: Min guard           General transfer comment: minG to power up with cues for hand placement and use of breaks on rollator. increased time to power up    Ambulation/Gait Ambulation/Gait assistance: Min guard Gait Distance (Feet): 150 Feet (+ 75 ft) Assistive device: Rollator (4 wheels) Gait Pattern/deviations: Step-through pattern Gait velocity: 0.45 m/s Gait velocity interpretation: 1.31 - 2.62 ft/sec, indicative of limited community ambulator   General Gait Details: wide BOS with slight trunk flexion. Pt cued for PLB with gait, maintained SpO2 88-94% on 8L when good pleth. seated rest for 4 min after 150 ft prior to continuing ambulation       Balance Overall balance assessment: Needs assistance Sitting-balance support: Feet supported Sitting balance-Leahy Scale:  Fair Sitting balance - Comments: able to reach and lean outside BOS   Standing balance support: Bilateral upper extremity supported, During functional activity Standing balance-Leahy Scale: Poor Standing balance comment: able to  static stand with single UE support                            Cognition Arousal/Alertness: Awake/alert Behavior During Therapy: WFL for tasks assessed/performed Overall Cognitive Status: Within Functional Limits for tasks assessed                                          Exercises Other Exercises Other Exercises: seated trunk rotation x 10 each direction Other Exercises: seated trunk flexion/extension with arms reaching x 10    General Comments General comments (skin integrity, edema, etc.): Pt on 9L O2 upon arrival, placed on 8L for mobility and tolerated well with SpO2 88-94% with mobility when cued for PLB. 93-95% at rest on 8L at end of session      Pertinent Vitals/Pain Pain Assessment Pain Assessment: Faces Faces Pain Scale: Hurts a little bit Pain Location: back Pain Descriptors / Indicators: Aching, Grimacing, Guarding Pain Intervention(s): Limited activity within patient's tolerance, Monitored during session, Repositioned     PT Goals (current goals can now be found in the care plan section) Acute Rehab PT Goals Patient Stated Goal: to get home and maybe have help with EMT's for stairs to enter house PT Goal Formulation: With patient/family Time For Goal Achievement: 01/16/22 Potential to Achieve Goals: Good Progress towards PT goals: Progressing toward goals    Frequency    Min 3X/week      PT Plan Current plan remains appropriate       AM-PAC PT "6 Clicks" Mobility   Outcome Measure  Help needed turning from your back to your side while in a flat bed without using bedrails?: A Little Help needed moving from lying on your back to sitting on the side of a flat bed without using bedrails?: A Little Help needed moving to and from a bed to a chair (including a wheelchair)?: A Little Help needed standing up from a chair using your arms (e.g., wheelchair or bedside chair)?: A Little Help needed to walk in hospital room?: A  Little Help needed climbing 3-5 steps with a railing? : Total 6 Click Score: 16    End of Session Equipment Utilized During Treatment: Oxygen Activity Tolerance: Patient limited by pain;Other (comment) Patient left: with call bell/phone within reach;in chair Nurse Communication: Mobility status PT Visit Diagnosis: Unsteadiness on feet (R26.81);Muscle weakness (generalized) (M62.81);Difficulty in walking, not elsewhere classified (R26.2)     Time: 9937-1696 PT Time Calculation (min) (ACUTE ONLY): 36 min  Charges:  $Therapeutic Exercise: 23-37 mins                     West Carbo, PT, DPT   Acute Rehabilitation Department Pager #: [phone]   Sandra Cockayne 01/05/2022, 10:53 AM

## 2022-01-05 NOTE — Progress Notes (Signed)
Cardiology Rounding Note  PCP-Cardiologist: [name], MD   Subjective:    Continues on 40 mg Torsemide BID. UOP 1.5L yesterday. Wt stable at 160 lb.   Scr 1.38 > 1.74  O2 up to 9L HFNC (6L yesterday).   She says that her breathing is the same, main complaint continues to be back pain.   Objective:   Weight Range: 72.8 kg Body mass index is 25.9 kg/m.   Vital Signs:   Temp:  [97.6 F (36.4 C)-98 F (36.7 C)] 97.6 F (36.4 C) (01/26 0741) Pulse Rate:  [55-65] 63 (01/26 0933) Resp:  [15-25] 25 (01/26 0843) BP: (107-125)/(48-69) 123/69 (01/26 0933) SpO2:  [90 %-98 %] 96 % (01/26 0852) Weight:  [72.8 kg] 72.8 kg (01/26 0500) Last BM Date: 01/01/22  Weight change: Filed Weights   01/03/22 0120 01/04/22 0442 01/05/22 0500  Weight: 74.4 kg 73 kg 72.8 kg    Intake/Output:   Intake/Output Summary (Last 24 hours) at 01/05/2022 0940 Last data filed at 01/05/2022 0600 Gross per 24 hour  Intake 240 ml  Output 950 ml  Net -710 ml      Physical Exam   General: NAD Neck: JVP difficult, does not appear significantly elevated, no thyromegaly or thyroid nodule.  Lungs: Distant BS.  CV: Nondisplaced PMI.  Heart regular S1/S2, no S3/S4, no murmur.  No peripheral edema.   Abdomen: Soft, nontender, no hepatosplenomegaly, no distention.  Skin: Intact without lesions or rashes.  Neurologic: Alert and oriented x 3.  Psych: Normal affect. Extremities: No clubbing or cyanosis.  HEENT: Normal.   Telemetry   NSR, personally reviewed.   Labs    CBC Recent Labs    01/03/22 0401  WBC 8.4  HGB 14.9  HCT 44.8  MCV 101.8*  PLT 952   Basic Metabolic Panel Recent Labs    01/04/22 0452 01/05/22 0456  NA 133* 134*  K 4.1 4.1  CL 97* 99  CO2 27 25  GLUCOSE 103* 209*  BUN 32* 47*  CREATININE 1.38* 1.74*  CALCIUM 8.7* 8.7*  MG  --  2.3   Liver Function Tests No results for input(s): AST, ALT, ALKPHOS, BILITOT, PROT, ALBUMIN in the last 72  hours.  No results for input(s): LIPASE, AMYLASE in the last 72 hours. Cardiac Enzymes No results for input(s): CKTOTAL, CKMB, CKMBINDEX, TROPONINI in the last 72 hours.  BNP: BNP (last 3 results) Recent Labs    11/18/21 1610 12/28/21 1218 12/29/21 0331  BNP 241.9* 565.2* 821.0*    ProBNP (last 3 results) No results for input(s): PROBNP in the last 8760 hours.   D-Dimer No results for input(s): DDIMER in the last 72 hours. Hemoglobin A1C No results for input(s): HGBA1C in the last 72 hours. Fasting Lipid Panel No results for input(s): CHOL, HDL, LDLCALC, TRIG, CHOLHDL, LDLDIRECT in the last 72 hours. Thyroid Function Tests No results for input(s): TSH, T4TOTAL, T3FREE, THYROIDAB in the last 72 hours.  Invalid input(s): FREET3  Other results:   Imaging    No results found.   Medications:     Scheduled Medications:  apixaban  5 mg Oral BID   arformoterol  15 mcg Nebulization BID   And   umeclidinium bromide  1 puff Inhalation Daily   budesonide (PULMICORT) nebulizer solution  0.25 mg Nebulization BID   buPROPion  150 mg Oral Daily   diclofenac Sodium  2 g Topical TID   guaiFENesin  600 mg Oral BID   insulin aspart  0-15 Units Subcutaneous TID WC   insulin glargine-yfgn  20 Units Subcutaneous Daily   ipratropium-albuterol  3 mL Nebulization BID   levothyroxine  75 mcg Oral QAC breakfast   lidocaine  2 patch Transdermal QHS   methocarbamol  1,000 mg Oral BID   metoprolol succinate  12.5 mg Oral Daily   nicotine  14 mg Transdermal Daily   polyethylene glycol  17 g Oral Daily   pravastatin  40 mg Oral q1800   senna-docusate  1 tablet Oral BID   sodium chloride flush  3 mL Intravenous Q12H   torsemide  40 mg Oral BID    Infusions:  sodium chloride      PRN Medications: sodium chloride, acetaminophen **OR** acetaminophen, albuterol, bisacodyl, guaiFENesin-dextromethorphan, sodium chloride flush    Patient Profile   Elizabeth Melton is a 81 year old with  a history of PE, PAF, ILD, COPD, and diastolic CHF with RV failure, and HFpEF.    Admitted with A/C respiratory failure + A/C HFpEF  Assessment/Plan   1. A/C Hypoxic Respiratory Failure: On 5 liters at home. Placed on Bipap in the ED and had weaned back to 5 liters. O2 increased from 6L HFNC to 9L overnight. Multifactorial hypoxemia: ILD, PAH and A/c CHF.  She does not appear significantly volume overloaded to me.  No fever, WBCs have not been elevated and PCT has been normal.  - c/w nebs/ bronchodilators  - CXR PA/lateral today.   - Check PCT today.  - [facility] to ask pulmonary to see again.  2. A/C HFpEF---> RV Failure: Echo in 11/22 showed EF 60-65%, mildly decreased RV systolic function with mild RV enlargement, mild Elizabeth, moderate TR, PASP 85 mmHg, mild aortic stenosis and mild aortic insufficiency, IVC dilated. The RV failure may be primarily due to pulmonary hypertension. RHC in 12/22 showed normal filling pressures with severe PH.  She was diuresed here, volume difficult but does not appear overloaded.  Creatinine up to 1.74 today.  - She has had 1 dose of torsemide this morning, will hold for now.  - RHC tomorrow to assess filling pressures to decide what to do with diuretics.  3. PAH: Pulmonary venous hypertension on 7/22 RHC.  However, last echo in 11/22 showed PASP up to 85 mmHg. No evidence for chronic PE on 9/22 V/Q scan.  RHC in 12/22 showed normal filling pressures with severe pulmonary arterial hypertension.  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    - With PH-ILD, a trial of Tyvaso-DPI planned. Tyvaso application pending.  4. PAF: Maintaining SR.  -On Eliquis 5 mg twice a day.  5. ILD: Followed by Dr. [name].  Seen by pulmonary here, doubted acute ILD flare. IV methylprednisolone stopped  6. DMII -On SSI 7. AKI on CKD Stage IIIa: Creatinine up to 1.74, ?overdiuresis.  - Hold diuretics today.  - RHC tomorrow.  8.   Hypothyroidism - On  Levothyroxine.  9.Tobacco Abuse: Last smoked 2 weeks ago.  10. Back pain: Her main complaint. Per [facility].   Disposition: SNF when stable.   Length of Stay: [place] 01/05/2022 10:13 AM

## 2022-01-05 NOTE — Care Management Important Message (Signed)
Important Message  Patient Details  Name: Elizabeth Melton MRN: [id number] Date of Birth: Nov 07, 1941   Medicare Important Message Given:  Yes     [name] 01/05/2022, 8:40 AM

## 2022-01-05 NOTE — Progress Notes (Signed)
Pt does not want to wear BiPAP at this moment, stated she will let RT know when she is ready.

## 2022-01-05 NOTE — TOC Progression Note (Addendum)
Transition of Care [facility]) - Progression Note    Patient Details  Name: Elizabeth Melton MRN: [id number] Date of Birth: [birth year]  [facility] CM/SW Contact  Tiernan Suto, LCSW Phone Number: 01/05/2022, 2:25 PM  Clinical Narrative:    HF CSW reached out to [place] with [facility] [phone] to see about the insurance authorization. Maudie Mercury reported that she hasn't started the insurance authorization yet as she was waiting for the patient to be weaned to baseline O2 at 5L. CSW informed Maudie Mercury that she has not been weaned yet and will keep her updated about Ms. Cromartie's O2.  5:15pm - HF CSW reached out to the patient's daughter, [name] [phone] to see if she was able to visit the SNF. [name] reported that she wasn't able to today but will go tomorrow for a tour and let the CSW know what they decide.  CSW will continue to follow throughout discharge.   Expected Discharge Plan: Skilled Nursing Facility Barriers to Discharge: Continued Medical Work up  Expected Discharge Plan and Services Expected Discharge Plan: [place] In-house Referral: Clinical Social Work Discharge Planning Services: CM Consult Post Acute Care Choice: [place] arrangements for the past 2 months: Apartment                   DME Agency: [employer] Date DME Agency Contacted: 12/30/21 Time DME Agency Contacted: 1600 Representative spoke with at DME Agency: [place] ([place]) Interventions    Readmission Risk Interventions Readmission Risk Prevention Plan 09/10/2020  Post Dischage Appt Complete  Medication Screening Complete  Transportation Screening Complete  Some recent data might be hidden   Javi Bollman, MSW, LCSW [phone] Heart Failure Social Worker

## 2022-01-05 NOTE — Progress Notes (Signed)
Suppository given by Agricultural consultant for relief.

## 2022-01-05 NOTE — H&P (View-Only) (Signed)
Cardiology Rounding Note  PCP-Cardiologist: [name], MD   Subjective:    Continues on 40 mg Torsemide BID. UOP 1.5L yesterday. Wt stable at 160 lb.   Scr 1.38 > 1.74  O2 up to 9L HFNC (6L yesterday).   She says that her breathing is the same, main complaint continues to be back pain.   Objective:   Weight Range: 72.8 kg Body mass index is 25.9 kg/m.   Vital Signs:   Temp:  [97.6 F (36.4 C)-98 F (36.7 C)] 97.6 F (36.4 C) (01/26 0741) Pulse Rate:  [55-65] 63 (01/26 0933) Resp:  [15-25] 25 (01/26 0843) BP: (107-125)/(48-69) 123/69 (01/26 0933) SpO2:  [90 %-98 %] 96 % (01/26 0852) Weight:  [72.8 kg] 72.8 kg (01/26 0500) Last BM Date: 01/01/22  Weight change: Filed Weights   01/03/22 0120 01/04/22 0442 01/05/22 0500  Weight: 74.4 kg 73 kg 72.8 kg    Intake/Output:   Intake/Output Summary (Last 24 hours) at 01/05/2022 0940 Last data filed at 01/05/2022 0600 Gross per 24 hour  Intake 240 ml  Output 950 ml  Net -710 ml      Physical Exam   General: NAD Neck: JVP difficult, does not appear significantly elevated, no thyromegaly or thyroid nodule.  Lungs: Distant BS.  CV: Nondisplaced PMI.  Heart regular S1/S2, no S3/S4, no murmur.  No peripheral edema.   Abdomen: Soft, nontender, no hepatosplenomegaly, no distention.  Skin: Intact without lesions or rashes.  Neurologic: Alert and oriented x 3.  Psych: Normal affect. Extremities: No clubbing or cyanosis.  HEENT: Normal.   Telemetry   NSR, personally reviewed.   Labs    CBC Recent Labs    01/03/22 0401  WBC 8.4  HGB 14.9  HCT 44.8  MCV 101.8*  PLT 952   Basic Metabolic Panel Recent Labs    01/04/22 0452 01/05/22 0456  NA 133* 134*  K 4.1 4.1  CL 97* 99  CO2 27 25  GLUCOSE 103* 209*  BUN 32* 47*  CREATININE 1.38* 1.74*  CALCIUM 8.7* 8.7*  MG  --  2.3   Liver Function Tests No results for input(s): AST, ALT, ALKPHOS, BILITOT, PROT, ALBUMIN in the last 72  hours.  No results for input(s): LIPASE, AMYLASE in the last 72 hours. Cardiac Enzymes No results for input(s): CKTOTAL, CKMB, CKMBINDEX, TROPONINI in the last 72 hours.  BNP: BNP (last 3 results) Recent Labs    11/18/21 1610 12/28/21 1218 12/29/21 0331  BNP 241.9* 565.2* 821.0*    ProBNP (last 3 results) No results for input(s): PROBNP in the last 8760 hours.   D-Dimer No results for input(s): DDIMER in the last 72 hours. Hemoglobin A1C No results for input(s): HGBA1C in the last 72 hours. Fasting Lipid Panel No results for input(s): CHOL, HDL, LDLCALC, TRIG, CHOLHDL, LDLDIRECT in the last 72 hours. Thyroid Function Tests No results for input(s): TSH, T4TOTAL, T3FREE, THYROIDAB in the last 72 hours.  Invalid input(s): FREET3  Other results:   Imaging    No results found.   Medications:     Scheduled Medications:  apixaban  5 mg Oral BID   arformoterol  15 mcg Nebulization BID   And   umeclidinium bromide  1 puff Inhalation Daily   budesonide (PULMICORT) nebulizer solution  0.25 mg Nebulization BID   buPROPion  150 mg Oral Daily   diclofenac Sodium  2 g Topical TID   guaiFENesin  600 mg Oral BID   insulin aspart  0-15 Units Subcutaneous TID WC   insulin glargine-yfgn  20 Units Subcutaneous Daily   ipratropium-albuterol  3 mL Nebulization BID   levothyroxine  75 mcg Oral QAC breakfast   lidocaine  2 patch Transdermal QHS   methocarbamol  1,000 mg Oral BID   metoprolol succinate  12.5 mg Oral Daily   nicotine  14 mg Transdermal Daily   polyethylene glycol  17 g Oral Daily   pravastatin  40 mg Oral q1800   senna-docusate  1 tablet Oral BID   sodium chloride flush  3 mL Intravenous Q12H   torsemide  40 mg Oral BID    Infusions:  sodium chloride      PRN Medications: sodium chloride, acetaminophen **OR** acetaminophen, albuterol, bisacodyl, guaiFENesin-dextromethorphan, sodium chloride flush    Patient Profile   Elizabeth Melton is a 81 year old with  a history of PE, PAF, ILD, COPD, and diastolic CHF with RV failure, and HFpEF.    Admitted with A/C respiratory failure + A/C HFpEF  Assessment/Plan   1. A/C Hypoxic Respiratory Failure: On 5 liters at home. Placed on Bipap in the ED and had weaned back to 5 liters. O2 increased from 6L HFNC to 9L overnight. Multifactorial hypoxemia: ILD, PAH and A/c CHF.  She does not appear significantly volume overloaded to me.  No fever, WBCs have not been elevated and PCT has been normal.  - c/w nebs/ bronchodilators  - CXR PA/lateral today.   - Check PCT today.  - [facility] to ask pulmonary to see again.  2. A/C HFpEF---> RV Failure: Echo in 11/22 showed EF 60-65%, mildly decreased RV systolic function with mild RV enlargement, mild Elizabeth, moderate TR, PASP 85 mmHg, mild aortic stenosis and mild aortic insufficiency, IVC dilated. The RV failure may be primarily due to pulmonary hypertension. RHC in 12/22 showed normal filling pressures with severe PH.  She was diuresed here, volume difficult but does not appear overloaded.  Creatinine up to 1.74 today.  - She has had 1 dose of torsemide this morning, will hold for now.  - RHC tomorrow to assess filling pressures to decide what to do with diuretics.  3. PAH: Pulmonary venous hypertension on 7/22 RHC.  However, last echo in 11/22 showed PASP up to 85 mmHg. No evidence for chronic PE on 9/22 V/Q scan.  RHC in 12/22 showed normal filling pressures with severe pulmonary arterial hypertension.  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    - With PH-ILD, a trial of Tyvaso-DPI planned. Tyvaso application pending.  4. PAF: Maintaining SR.  -On Eliquis 5 mg twice a day.  5. ILD: Followed by Dr. [name].  Seen by pulmonary here, doubted acute ILD flare. IV methylprednisolone stopped  6. DMII -On SSI 7. AKI on CKD Stage IIIa: Creatinine up to 1.74, ?overdiuresis.  - Hold diuretics today.  - RHC tomorrow.  8.   Hypothyroidism - On  Levothyroxine.  9.Tobacco Abuse: Last smoked 2 weeks ago.  10. Back pain: Her main complaint. Per [facility].   Disposition: SNF when stable.   Length of Stay: [place] 01/05/2022 10:13 AM

## 2022-01-05 NOTE — Progress Notes (Signed)
Patient ID: Elizabeth Melton, female   DOB: 09-Dec-1941, 81 y.o.   MRN: [id number]  PROGRESS NOTE    Elizabeth Melton  ZWC:[id number] DOB: 1941/09/28 DOA: 12/28/2021 PCP: Kathyrn Lass, MD   Brief Narrative:  81 year old female with history of COPD, interstitial lung disease, chronic respiratory failure with hypoxia on 4 to 5 L oxygen via nasal cannula at home, severe pulmonary hypertension, chronic diastolic CHF, diabetes mellitus type 2, hypertension, paroxysmal A. fib, history of pulmonary embolism on Eliquis, PAD presented with worsening shortness of breath.  EMS found her with oxygen saturations of 85% on 5 L, she was placed on BiPAP and transferred to the ED.  She had missed few doses of diuretics at home.  In ED, chest x-ray showed chronic findings; labs were unremarkable.  Pulmonary and cardiology were consulted.  She was started on IV Lasix with modest response.  Subsequently, pulmonary has signed off.  She has already been switched to oral torsemide by heart failure team.  PT recommended SNF placement.  Assessment & Plan:   Acute on chronic hypoxic respiratory failure Acute on chronic diastolic heart failure Severe pulmonary artery hypertension -Recent right heart cath on 11/23/2021 showed normal filling pressures, severe PAH and with low cardiac output.  Plan was to add Tyvaso as an outpatient. -Echo in 11/202 showed EF of 60 to 65% with moderate TR, PASP of 85 mmHg -Missed few doses of diuretics at home -Heart failure team following.  Currently on torsemide 40 mg twice a day.  Continue metoprolol succinate.  Strict input and output.  Daily weights.  Fluid restriction.  Negative balance of 8710 cc since presentation -Oxygen requirement worsening: Currently on 9 L oxygen via nasal cannula.  Normally wears 4 L oxygen at home.  Wean off as able.  We will check chest x-ray today. -Pulmonary evaluated the patient and signed off: ILD progression palpable slightly.  Steroids discontinued.   Outpatient follow-up with pulmonary -Overall prognosis is guarded to poor.  Palliative care consultation is pending.  Acute on low chronic back pain -Appears musculoskeletal; no history of injury or fall -Lumbar spine and sacrum/coccyx x-rays on 01/03/2022 showed no fractures; showed first-degree anterolisthesis at L4-L5 level along with disc space narrowing. -Continue Voltaren gel, Robaxin and lidocaine patch. -Continue physical therapy. -Will need SNF placement  AKI on CKD stage IIIa -creatinine worsening today to 1.74.  Monitor  Hyponatremia -Mild.  Monitor.  Paroxysmal A. Fib -Currently rate controlled.  Continue Toprol-XL and Eliquis  COPD Interstitial lung disease -Outpatient follow-up with pulmonary.  Considering antifibrotic therapy as an outpatient.  Tobacco abuse -Continue nicotine patch.  Essential hypertension -Blood pressure stable.  Continue Toprol-XL and diuretics  Diabetes mellitus type 2 with hypoglycemia -A1c 7.6 in 10/2021.  Continue long-acting insulin along with CBGs with SSI  Hypothyroidism -Continue levothyroxine  Hyperlipidemia/PAD -Continue pravastatin   DVT prophylaxis: Eliquis Code Status: Full Family Communication: None at bedside Disposition Plan: Status is: Inpatient  Remains inpatient appropriate because: Of need for SNF placement   Consultants: Heart failure team/pulmonary/palliative care  Procedures: None  Antimicrobials: None   Subjective: Patient seen and examined at bedside.  Poor historian.  No chest pain, fever, vomiting or abdominal pain reported.  Still coughing and short of breath with minimal exertion.  Complains of constipation.  Objective: Vitals:   01/04/22 1628 01/04/22 2032 01/05/22 0500 01/05/22 0515  BP: (!) 125/48   (!) 119/48  Pulse: 62   (!) 55  Resp: 16   15  Temp:  98 F (36.7 C)  TempSrc:    Oral  SpO2: 92% 95%  94%  Weight:   72.8 kg   Height:        Intake/Output Summary (Last 24 hours)  at 01/05/2022 0718 Last data filed at 01/05/2022 0600 Gross per 24 hour  Intake 490 ml  Output 1450 ml  Net -960 ml    Filed Weights   01/03/22 0120 01/04/22 0442 01/05/22 0500  Weight: 74.4 kg 73 kg 72.8 kg    Examination:  General exam: Appears chronically ill and deconditioned.  No distress.  On 9 L oxygen by nasal cannula  respiratory system: Decreased breath sounds at bases bilaterally with some crackles  cardiovascular system: Currently rate controlled; S1-S2 heard gastrointestinal system: Abdomen is distended slightly; soft and nontender.  Bowel sounds are heard Extremities: Mild lower extremity edema present; no clubbing  Central nervous system: Alert; still slow to respond.  Poor historian.  No focal neurological deficits.  Moves extremities Skin: No obvious ecchymosis/lesions  psychiatry: Flat affect mostly.  Data Reviewed: I have personally reviewed following labs and imaging studies  CBC: Recent Labs  Lab 12/30/21 0320 01/02/22 0422 01/03/22 0401  WBC 13.1* 8.9 8.4  HGB 14.3 15.6* 14.9  HCT 43.5 48.6* 44.8  MCV 102.6* 102.7* 101.8*  PLT 145* 155 948    Basic Metabolic Panel: Recent Labs  Lab 12/30/21 0320 12/31/21 0915 01/01/22 0150 01/02/22 0422 01/03/22 0401 01/04/22 0452 01/05/22 0456  NA 136   < > 138 133* 133* 133* 134*  K 4.1   < > 4.3 4.3 4.4 4.1 4.1  CL 104   < > 102 101 99 97* 99  CO2 24   < > _0 GLUCOSE 176*   < > 127* 202* 134* 103* 209*  BUN 31*   < > 29* 35* 33* 32* 47*  CREATININE 1.30*   < > 1.16* 1.28* 1.35* 1.38* 1.74*  CALCIUM 8.7*   < > 8.7* 8.3* 8.3* 8.7* 8.7*  MG 2.3  --  2.7*  --   --   --  2.3   < > = values in this interval not displayed.    GFR: Estimated Creatinine Clearance: 26.8 mL/min (A) (by C-G formula based on SCr of 1.74 mg/dL (H)). Liver Function Tests: No results for input(s): AST, ALT, ALKPHOS, BILITOT, PROT, ALBUMIN in the last 168 hours.  No results for input(s): LIPASE, AMYLASE in the last  168 hours. No results for input(s): AMMONIA in the last 168 hours. Coagulation Profile: No results for input(s): INR, PROTIME in the last 168 hours. Cardiac Enzymes: No results for input(s): CKTOTAL, CKMB, CKMBINDEX, TROPONINI in the last 168 hours. BNP (last 3 results) No results for input(s): PROBNP in the last 8760 hours. HbA1C: No results for input(s): HGBA1C in the last 72 hours. CBG: Recent Labs  Lab 01/04/22 0456 01/04/22 0600 01/04/22 1125 01/04/22 1605 01/05/22 0507  GLUCAP 108* 127* 169* 153* 213*    Lipid Profile: No results for input(s): CHOL, HDL, LDLCALC, TRIG, CHOLHDL, LDLDIRECT in the last 72 hours. Thyroid Function Tests: No results for input(s): TSH, T4TOTAL, FREET4, T3FREE, THYROIDAB in the last 72 hours. Anemia Panel: No results for input(s): VITAMINB12, FOLATE, FERRITIN, TIBC, IRON, RETICCTPCT in the last 72 hours. Sepsis Labs: Recent Labs  Lab 12/30/21 0320  PROCALCITON <0.10     Recent Results (from the past 240 hour(s))  Resp Panel by RT-PCR (Flu A&B, Covid) Nasopharyngeal Swab  Status: None   Collection Time: 12/28/21  3:50 PM   Specimen: Nasopharyngeal Swab; Nasopharyngeal(NP) swabs in vial transport medium  Result Value Ref Range Status   SARS Coronavirus 2 by RT PCR NEGATIVE NEGATIVE Final    Comment: (NOTE) SARS-CoV-2 target nucleic acids are NOT DETECTED.  The SARS-CoV-2 RNA is generally detectable in upper respiratory specimens during the acute phase of infection. The lowest concentration of SARS-CoV-2 viral copies this assay can detect is 138 copies/mL. A negative result does not preclude SARS-Cov-2 infection and should not be used as the sole basis for treatment or other patient management decisions. A negative result may occur with  improper specimen collection/handling, submission of specimen other than nasopharyngeal swab, presence of viral mutation(s) within the areas targeted by this assay, and inadequate number of  viral copies(<138 copies/mL). A negative result must be combined with clinical observations, patient history, and epidemiological information. The expected result is Negative.  Fact Sheet for Patients:  EntrepreneurPulse.com.au  Fact Sheet for Healthcare Providers:  IncredibleEmployment.be  This test is no t yet approved or cleared by the [place] FDA and  has been authorized for detection and/or diagnosis of SARS-CoV-2 by FDA under an Emergency Use Authorization (EUA). This EUA will remain  in effect (meaning this test can be used) for the duration of the COVID-19 declaration under Section 564(b)(1) of the Act, 21 U.S.C.section 360bbb-3(b)(1), unless the authorization is terminated  or revoked sooner.       Influenza A by PCR NEGATIVE NEGATIVE Final   Influenza B by PCR NEGATIVE NEGATIVE Final    Comment: (NOTE) The Xpert Xpress SARS-CoV-2/FLU/RSV plus assay is intended as an aid in the diagnosis of influenza from Nasopharyngeal swab specimens and should not be used as a sole basis for treatment. Nasal washings and aspirates are unacceptable for Xpert Xpress SARS-CoV-2/FLU/RSV testing.  Fact Sheet for Patients: EntrepreneurPulse.com.au  Fact Sheet for Healthcare Providers: IncredibleEmployment.be  This test is not yet approved or cleared by the [place] FDA and has been authorized for detection and/or diagnosis of SARS-CoV-2 by FDA under an Emergency Use Authorization (EUA). This EUA will remain in effect (meaning this test can be used) for the duration of the COVID-19 declaration under Section 564(b)(1) of the Act, 21 U.S.C. section 360bbb-3(b)(1), unless the authorization is terminated or revoked.  Performed at [facility], [place] [address], [place], [place] [address]           Radiology Studies: DG Lumbar Spine 2-3 Views  Result Date: 01/03/2022 CLINICAL DATA:  Back pain  EXAM: LUMBAR SPINE - 2-3 VIEW COMPARISON:  None. FINDINGS: No recent fracture is seen. There is first-degree anterolisthesis at L4-L5 level. There is disc space narrowing at the L4-L5 level. Degenerative changes are noted in facet joints, more so at L4-L5 and L5-S1 levels. Extensive arterial calcifications are seen. Calcifications in the right upper quadrant suggest gallbladder stones. Fibrotic changes are noted in the visualized lower lung fields. IMPRESSION: No recent fracture is seen. There is first-degree anterolisthesis at L4-L5 level along with disc space narrowing. Degenerative changes are noted in facet joints in the lower lumbar spine. Gallbladder stones. Pulmonary fibrosis.  Severe atherosclerotic disease. Electronically Signed   By: Elmer Picker M.D.   On: 01/03/2022 12:30   DG Sacrum/Coccyx  Result Date: 01/03/2022 CLINICAL DATA:  Low back pain EXAM: SACRUM AND COCCYX - 2+ VIEW COMPARISON:  None. FINDINGS: No recent fracture is seen. There is sclerosis adjacent to the SI joints, especially in the  inferior aspect of right SI joint. Osteopenia is seen in bony structures. Vascular calcifications are seen. Coarse calcifications in the pelvis suggest possible calcified uterine fibroids. IMPRESSION: No recent fracture is seen. Osteopenia. Degenerative changes are noted in the SI joints, more so on the right side. Electronically Signed   By: Elmer Picker M.D.   On: 01/03/2022 12:32        Scheduled Meds:  apixaban  5 mg Oral BID   arformoterol  15 mcg Nebulization BID   And   umeclidinium bromide  1 puff Inhalation Daily   budesonide (PULMICORT) nebulizer solution  0.25 mg Nebulization BID   buPROPion  150 mg Oral Daily   diclofenac Sodium  2 g Topical TID   guaiFENesin  600 mg Oral BID   insulin aspart  0-15 Units Subcutaneous TID WC   insulin glargine-yfgn  20 Units Subcutaneous Daily   ipratropium-albuterol  3 mL Nebulization BID   levothyroxine  75 mcg Oral QAC breakfast    lidocaine  2 patch Transdermal QHS   methocarbamol  1,000 mg Oral BID   metoprolol succinate  12.5 mg Oral Daily   nicotine  14 mg Transdermal Daily   polyethylene glycol  17 g Oral Daily   pravastatin  40 mg Oral q1800   senna-docusate  1 tablet Oral BID   sodium chloride flush  3 mL Intravenous Q12H   torsemide  40 mg Oral BID   Continuous Infusions:  sodium chloride            Aline August, MD [facility] 01/05/2022, 7:18 AM

## 2022-01-05 NOTE — Progress Notes (Signed)
Mobility Specialist Progress Note:   01/05/22 1053  Mobility  Activity Transferred from chair to bed  Level of Assistance Minimal assist, patient does 75% or more  Assistive Device  (HHA)  Distance Ambulated (ft) 4 ft  Activity Response Tolerated well  $Mobility charge 1 Mobility   Pt needing to go back to bed for x-ray.  [facility] Public librarian Phone [phone] Secondary Phone [phone]

## 2022-01-06 ENCOUNTER — Encounter (HOSPITAL_COMMUNITY): Payer: Self-pay | Admitting: Cardiology

## 2022-01-06 ENCOUNTER — Inpatient Hospital Stay (HOSPITAL_COMMUNITY)
Admission: EM | Disposition: A | Discharge status: Skilled Nursing Facility | Payer: Self-pay | Source: Home / Self Care | Attending: Internal Medicine

## 2022-01-06 DIAGNOSIS — M5441 Lumbago with sciatica, right side: Secondary | ICD-10-CM

## 2022-01-06 DIAGNOSIS — I5033 Acute on chronic diastolic (congestive) heart failure: Secondary | ICD-10-CM | POA: Diagnosis not present

## 2022-01-06 DIAGNOSIS — N179 Acute kidney failure, unspecified: Secondary | ICD-10-CM | POA: Diagnosis not present

## 2022-01-06 DIAGNOSIS — I480 Paroxysmal atrial fibrillation: Secondary | ICD-10-CM | POA: Diagnosis not present

## 2022-01-06 DIAGNOSIS — I272 Pulmonary hypertension, unspecified: Secondary | ICD-10-CM | POA: Diagnosis not present

## 2022-01-06 DIAGNOSIS — J9621 Acute and chronic respiratory failure with hypoxia: Secondary | ICD-10-CM | POA: Diagnosis not present

## 2022-01-06 DIAGNOSIS — G8929 Other chronic pain: Secondary | ICD-10-CM

## 2022-01-06 HISTORY — PX: RIGHT HEART CATH: CATH118263

## 2022-01-06 LAB — POCT I-STAT EG7
Acid-Base Excess: 5 mmol/L — ABNORMAL HIGH (ref 0.0–2.0)
Acid-Base Excess: 6 mmol/L — ABNORMAL HIGH (ref 0.0–2.0)
Bicarbonate: 31 mmol/L — ABNORMAL HIGH (ref 20.0–28.0)
Bicarbonate: 31.4 mmol/L — ABNORMAL HIGH (ref 20.0–28.0)
Calcium, Ion: 1.2 mmol/L (ref 1.15–1.40)
Calcium, Ion: 1.2 mmol/L (ref 1.15–1.40)
HCT: 50 % — ABNORMAL HIGH (ref 36.0–46.0)
HCT: 50 % — ABNORMAL HIGH (ref 36.0–46.0)
Hemoglobin: 17 g/dL — ABNORMAL HIGH (ref 12.0–15.0)
Hemoglobin: 17 g/dL — ABNORMAL HIGH (ref 12.0–15.0)
O2 Saturation: 58 %
O2 Saturation: 62 %
Potassium: 4.1 mmol/L (ref 3.5–5.1)
Potassium: 4.2 mmol/L (ref 3.5–5.1)
Sodium: 138 mmol/L (ref 135–145)
Sodium: 139 mmol/L (ref 135–145)
TCO2: 32 mmol/L (ref 22–32)
TCO2: 33 mmol/L — ABNORMAL HIGH (ref 22–32)
pCO2, Ven: 47.9 mmHg (ref 44.0–60.0)
pCO2, Ven: 48 mmHg (ref 44.0–60.0)
pH, Ven: 7.419 (ref 7.250–7.430)
pH, Ven: 7.424 (ref 7.250–7.430)
pO2, Ven: 30 mmHg — CL (ref 32.0–45.0)
pO2, Ven: 32 mmHg (ref 32.0–45.0)

## 2022-01-06 LAB — BASIC METABOLIC PANEL
Anion gap: 9 (ref 5–15)
BUN: 41 mg/dL — ABNORMAL HIGH (ref 8–23)
CO2: 28 mmol/L (ref 22–32)
Calcium: 8.9 mg/dL (ref 8.9–10.3)
Chloride: 98 mmol/L (ref 98–111)
Creatinine, Ser: 1.4 mg/dL — ABNORMAL HIGH (ref 0.44–1.00)
GFR, Estimated: 38 mL/min — ABNORMAL LOW (ref >60)
Glucose, Bld: 111 mg/dL — ABNORMAL HIGH (ref 70–99)
Potassium: 4.2 mmol/L (ref 3.5–5.1)
Sodium: 135 mmol/L (ref 135–145)

## 2022-01-06 LAB — GLUCOSE, CAPILLARY
Glucose-Capillary: 136 mg/dL — ABNORMAL HIGH (ref 70–99)
Glucose-Capillary: 196 mg/dL — ABNORMAL HIGH (ref 70–99)
Glucose-Capillary: 148 mg/dL — ABNORMAL HIGH (ref 70–99)
Glucose-Capillary: 78 mg/dL (ref 70–99)

## 2022-01-06 LAB — MAGNESIUM: Magnesium: 2.6 mg/dL — ABNORMAL HIGH (ref 1.7–2.4)

## 2022-01-06 LAB — PROCALCITONIN: Procalcitonin: <0.1 ng/mL

## 2022-01-06 SURGERY — RIGHT HEART CATH
Anesthesia: LOCAL

## 2022-01-06 MED ORDER — ONDANSETRON HCL 4 MG/2ML IJ SOLN
4.0000 mg | Freq: Three times a day (TID) | INTRAMUSCULAR | Status: DC | PRN
  Filled 2022-01-06: qty 2

## 2022-01-06 MED ORDER — SODIUM CHLORIDE 0.9 % IV SOLN: 250.0000 mL | INTRAVENOUS | Status: DC | PRN

## 2022-01-06 MED ORDER — HEPARIN (PORCINE) IN NACL 1000-0.9 UT/500ML-% IV SOLN
INTRAVENOUS | Status: AC
  Filled 2022-01-06: qty 1000

## 2022-01-06 MED ORDER — APIXABAN 5 MG PO TABS
5.0000 mg | ORAL_TABLET | Freq: Two times a day (BID) | ORAL | Status: DC
  Administered 2022-01-06 – 2022-01-13 (×15): 5 mg via ORAL
  Filled 2022-01-06 (×15): qty 1

## 2022-01-06 MED ORDER — ACETAMINOPHEN 325 MG PO TABS
650.0000 mg | ORAL_TABLET | ORAL | Status: DC | PRN
  Filled 2022-01-06: qty 2

## 2022-01-06 MED ORDER — LIDOCAINE HCL (PF) 1 % IJ SOLN
INTRAMUSCULAR | Status: DC | PRN
  Administered 2022-01-06: 2 mL

## 2022-01-06 MED ORDER — LABETALOL HCL 5 MG/ML IV SOLN: 10.0000 mg | INTRAVENOUS | Status: AC | PRN

## 2022-01-06 MED ORDER — ONDANSETRON HCL 4 MG/2ML IJ SOLN
4.0000 mg | Freq: Four times a day (QID) | INTRAMUSCULAR | Status: DC | PRN
  Administered 2022-01-11: 4 mg via INTRAVENOUS
  Filled 2022-01-06: qty 2

## 2022-01-06 MED ORDER — SODIUM CHLORIDE 0.9% FLUSH
3.0000 mL | Freq: Two times a day (BID) | INTRAVENOUS | Status: DC
  Administered 2022-01-06 – 2022-01-13 (×15): 3 mL via INTRAVENOUS

## 2022-01-06 MED ORDER — LIDOCAINE HCL (PF) 1 % IJ SOLN
INTRAMUSCULAR | Status: AC
  Filled 2022-01-06: qty 30

## 2022-01-06 MED ORDER — HYDRALAZINE HCL 20 MG/ML IJ SOLN: 10.0000 mg | INTRAMUSCULAR | Status: AC | PRN

## 2022-01-06 MED ORDER — SODIUM CHLORIDE 0.9% FLUSH: 3.0000 mL | INTRAVENOUS | Status: DC | PRN

## 2022-01-06 MED ORDER — TORSEMIDE 20 MG PO TABS
40.0000 mg | ORAL_TABLET | Freq: Every day | ORAL | Status: DC
  Administered 2022-01-06 – 2022-01-13 (×8): 40 mg via ORAL
  Filled 2022-01-06 (×9): qty 2

## 2022-01-06 MED ORDER — HEPARIN (PORCINE) IN NACL 1000-0.9 UT/500ML-% IV SOLN
INTRAVENOUS | Status: DC | PRN
  Administered 2022-01-06: 500 mL

## 2022-01-06 SURGICAL SUPPLY — 7 items
CATH BALLN WEDGE 5F 110CM (CATHETERS) ×1 IMPLANT
KIT HEART LEFT (KITS) ×3 IMPLANT
PACK CARDIAC CATHETERIZATION (CUSTOM PROCEDURE TRAY) ×3 IMPLANT
PROTECTION STATION PRESSURIZED (MISCELLANEOUS) ×2
SHEATH GLIDE SLENDER 4/5FR (SHEATH) ×1 IMPLANT
STATION PROTECTION PRESSURIZED (MISCELLANEOUS) IMPLANT
TRANSDUCER W/STOPCOCK (MISCELLANEOUS) ×3 IMPLANT

## 2022-01-06 NOTE — Progress Notes (Signed)
Physical Therapy Treatment Patient Details Name: Elizabeth Melton MRN: [id number] DOB: May 24, 1941 Today's Date: 01/06/2022   History of Present Illness 81 y.o. female was admitted 1/18 after recent admits with SOB and was hypoxic on home O2 of 5L.  Has new CPAP orders.  Pt is in CHF with diuresing and using purwick with significant low baseline sats 87-89%.  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 The pt continues to desat to 86% with exertion, but recovered to >90% with 1-2 min seated rest. The pt was then able to complete 175 ft hallway ambulation without seated rest indicating improved activity tolerance at this time despite increased O2 needs. Will continue to benefit from skilled PT to progress OOB mobility and endurance to facilitate progress towards pt goal of returning home.   Gait Speed: 0.87ms using rollator and with 10L O2. (Gait speed <0.669m indicates increased risk of falls and dependence in ADLs) `   Recommendations for follow up therapy are one component of a multi-disciplinary discharge planning process, led by the attending physician.  Recommendations may be updated based on patient status, additional functional criteria and insurance authorization.  Follow Up Recommendations  Skilled nursing-short term rehab (<3 hours/day)     Assistance Recommended at Discharge Frequent or constant Supervision/Assistance  Patient can return home with the following A little help with walking and/or transfers;A little help with bathing/dressing/bathroom;Assistance with cooking/housework;Assist for transportation;Help with stairs or ramp for entrance   Equipment Recommendations  None recommended by PT    Recommendations for Other Services        Precautions / Restrictions Precautions Precautions: Fall Precaution Comments: pt on 10L with mobility Restrictions Weight Bearing Restrictions: No     Mobility  Bed Mobility               General bed mobility comments: pt OOB on arrival of PT and returned to recliner at en of session    Transfers Overall transfer level: Needs assistance Equipment used: Rollator (4 wheels) Transfers: Sit to/from Stand Sit to Stand: Min guard           General transfer comment: minG to power up with cues for hand placement and use of breaks on rollator. increased time to power up    Ambulation/Gait Ambulation/Gait assistance: Min guard Gait Distance (Feet): 35 Feet (+ [id number] device: Rollator (4 wheels) Gait Pattern/deviations: Step-through pattern Gait velocity: 0.31 Gait velocity interpretation: <1.31 ft/sec, indicative of household ambulator   General Gait Details: wide BOS with slight trunk flexion. Pt cued for PLB with gait, maintained SpO2 88-94% on 8L when good pleth. seated rest for 4 min after 150 ft prior to continuing ambulation   Stairs Stairs: Yes Stairs assistance: Min guard Stair Management: One rail Right, Step to pattern Number of Stairs: 3 (x3) General stair comments: 3 sets of 3 stairs with 2 min seated rest after each set. SpO2 low of 86% with good pleth on 10L      Balance Overall balance assessment: Needs assistance Sitting-balance support: Feet supported Sitting balance-Leahy Scale: Fair Sitting balance - Comments: able to reach and lean outside BOS   Standing balance support: Bilateral upper extremity supported, During functional activity Standing balance-Leahy Scale: Poor Standing  balance comment: able to static stand with single UE support                            Cognition Arousal/Alertness: Awake/alert Behavior During Therapy: WFL for tasks assessed/performed Overall Cognitive Status: Within Functional Limits for tasks  assessed                                          Exercises      General Comments General comments (skin integrity, edema, etc.): SpO2 to 80% on 9L at rest, increased to 10L for mobility with low of 86%      Pertinent Vitals/Pain Pain Assessment Pain Assessment: No/denies pain Pain Intervention(s): Monitored during session     PT Goals (current goals can now be found in the care plan section) Acute Rehab PT Goals Patient Stated Goal: to get home and maybe have help with EMT's for stairs to enter house PT Goal Formulation: With patient/family Time For Goal Achievement: 01/16/22 Potential to Achieve Goals: Good Progress towards PT goals: Progressing toward goals    Frequency    Min 3X/week      PT Plan Current plan remains appropriate       AM-PAC PT "6 Clicks" Mobility   Outcome Measure  Help needed turning from your back to your side while in a flat bed without using bedrails?: A Little Help needed moving from lying on your back to sitting on the side of a flat bed without using bedrails?: A Little Help needed moving to and from a bed to a chair (including a wheelchair)?: A Little Help needed standing up from a chair using your arms (e.g., wheelchair or bedside chair)?: A Little Help needed to walk in hospital room?: A Little Help needed climbing 3-5 steps with a railing? : A Little 6 Click Score: 18    End of Session Equipment Utilized During Treatment: Oxygen Activity Tolerance: Patient limited by pain;Other (comment) Patient left: with call bell/phone within reach;in chair Nurse Communication: Mobility status PT Visit Diagnosis: Unsteadiness on feet (R26.81);Muscle weakness (generalized) (M62.81);Difficulty in walking, not elsewhere classified (R26.2)     Time: 2432-7556 PT Time Calculation (min) (ACUTE ONLY): 38 min  Charges:  $Gait Training: 8-22 mins $Therapeutic Exercise: 23-37 mins                     West Carbo, PT, DPT    Acute Rehabilitation Department Pager #: [phone]   Sandra Cockayne 01/06/2022, 2:55 PM

## 2022-01-06 NOTE — Progress Notes (Signed)
Soap suds enema administered per order

## 2022-01-06 NOTE — Progress Notes (Signed)
Patient ID: Elizabeth Melton, female   DOB: [birth year], 81 y.o.   MRN: [id number]     Cardiology Rounding Note  PCP-Cardiologist: [name], MD   Subjective:    She had 1 dose of torsemide 40 mg daily yesterday.  Pulmonary saw, started back on steroids (prednisone) and doxycycline.   Scr 1.38 > 1.74 > 1.4  O2 still 9L HFNC.  She says that her breathing is the same, main complaint continues to be back pain.     RHC Procedural Findings: Hemodynamics (mmHg) RA 5 RV 72/7 PA 68/27, mean 42 PCWP mean 9  Oxygen saturations: PA 62% AO 96%  Cardiac Output (Fick) 3.38  Cardiac Index (Fick) 1.83 PVR 9.7 WU    Objective:   Weight Range: 74.7 kg Body mass index is 26.57 kg/m.   Vital Signs:   Temp:  [97.4 F (36.3 C)-99.2 F (37.3 C)] 97.5 F (36.4 C) (01/27 0600) Pulse Rate:  [57-74] 57 (01/27 0600) Resp:  [14-25] 18 (01/27 0600) BP: (108-132)/(55-79) 121/64 (01/27 0600) SpO2:  [89 %-97 %] 95 % (01/27 0727) Weight:  [74.7 kg] 74.7 kg (01/26 2331) Last BM Date: 01/01/22 (pt has had multilple meds to help with BM's)  Weight change: Filed Weights   01/04/22 0442 01/05/22 0500 01/05/22 2331  Weight: 73 kg 72.8 kg 74.7 kg    Intake/Output:   Intake/Output Summary (Last 24 hours) at 01/06/2022 0800 Last data filed at 01/06/2022 8032 Gross per 24 hour  Intake 360 ml  Output 1250 ml  Net -890 ml      Physical Exam   General: NAD Neck: No JVD, no thyromegaly or thyroid nodule.  Lungs: Dry crackles at bases CV: Nondisplaced PMI.  Heart regular S1/S2, no S3/S4, no murmur.  No peripheral edema.   Abdomen: Soft, nontender, no hepatosplenomegaly, no distention.  Skin: Intact without lesions or rashes.  Neurologic: Alert and oriented x 3.  Psych: Normal affect. Extremities: No clubbing or cyanosis.  HEENT: Normal.    Telemetry   NSR, personally reviewed.   Labs    CBC Recent Labs    01/05/22 0456  WBC 8.2  HGB 15.7*  HCT 48.4*  MCV 103.0*   PLT 122   Basic Metabolic Panel Recent Labs    01/05/22 0456 01/06/22 0324  NA 134* 135  K 4.1 4.2  CL 99 98  CO2 25 28  GLUCOSE 209* 111*  BUN 47* 41*  CREATININE 1.74* 1.40*  CALCIUM 8.7* 8.9  MG 2.3 2.6*   Liver Function Tests No results for input(s): AST, ALT, ALKPHOS, BILITOT, PROT, ALBUMIN in the last 72 hours.  No results for input(s): LIPASE, AMYLASE in the last 72 hours. Cardiac Enzymes No results for input(s): CKTOTAL, CKMB, CKMBINDEX, TROPONINI in the last 72 hours.  BNP: BNP (last 3 results) Recent Labs    11/18/21 1610 12/28/21 1218 12/29/21 0331  BNP 241.9* 565.2* 821.0*    ProBNP (last 3 results) No results for input(s): PROBNP in the last 8760 hours.   D-Dimer No results for input(s): DDIMER in the last 72 hours. Hemoglobin A1C No results for input(s): HGBA1C in the last 72 hours. Fasting Lipid Panel No results for input(s): CHOL, HDL, LDLCALC, TRIG, CHOLHDL, LDLDIRECT in the last 72 hours. Thyroid Function Tests No results for input(s): TSH, T4TOTAL, T3FREE, THYROIDAB in the last 72 hours.  Invalid input(s): FREET3  Other results:   Imaging    DG Chest 2 View  Result Date: 01/05/2022 CLINICAL DATA:  Shortness  of breath EXAM: CHEST - 2 VIEW COMPARISON:  Previous studies including the examination of 12/28/2021 FINDINGS: Transverse diameter of heart is increased. There is diffuse increase in interstitial markings in both lungs. There is interval improvement in aeration in the right mid and right lower lung fields. No new focal pulmonary consolidation is seen. Lateral CP angles are clear. There is no pneumothorax. IMPRESSION: Pulmonary fibrosis. Cardiomegaly. There is interval improvement in aeration in the right parahilar region and right lower lung fields suggesting resolving pulmonary edema or resolving pneumonia superimposed over pulmonary fibrosis. Electronically Signed   By: Elmer Picker M.D.   On: 01/05/2022 13:34   CARDIAC  CATHETERIZATION  Result Date: 01/06/2022 1. Optimized filling pressures. 2. Severe pulmonary arterial hypertension     Medications:     Scheduled Medications:  [MAR Hold] arformoterol  15 mcg Nebulization BID   And   [MAR Hold] umeclidinium bromide  1 puff Inhalation Daily   [MAR Hold] budesonide (PULMICORT) nebulizer solution  0.25 mg Nebulization BID   [MAR Hold] buPROPion  150 mg Oral Daily   [MAR Hold] diclofenac Sodium  2 g Topical TID   [MAR Hold] doxycycline  100 mg Oral Q12H   [MAR Hold] guaiFENesin  600 mg Oral BID   [MAR Hold] insulin aspart  0-15 Units Subcutaneous TID WC   [MAR Hold] insulin glargine-yfgn  20 Units Subcutaneous Daily   [MAR Hold] ipratropium-albuterol  3 mL Nebulization BID   [MAR Hold] levothyroxine  75 mcg Oral QAC breakfast   [MAR Hold] lidocaine  2 patch Transdermal QHS   [MAR Hold] methocarbamol  1,000 mg Oral BID   [MAR Hold] metoprolol succinate  12.5 mg Oral Daily   [MAR Hold] nicotine  14 mg Transdermal Daily   [MAR Hold] polyethylene glycol  17 g Oral Daily   [MAR Hold] pravastatin  40 mg Oral q1800   [MAR Hold] predniSONE  20 mg Oral Q breakfast   [MAR Hold] senna-docusate  1 tablet Oral BID   [MAR Hold] sodium chloride flush  3 mL Intravenous Q12H   sodium chloride flush  3 mL Intravenous Q12H    Infusions:  [MAR Hold] sodium chloride     sodium chloride     sodium chloride 10 mL/hr at 01/06/22 0634    PRN Medications: [MAR Hold] sodium chloride, sodium chloride, [MAR Hold] acetaminophen **OR** [MAR Hold] acetaminophen, [MAR Hold] albuterol, [MAR Hold] alum & mag hydroxide-simeth, [MAR Hold] bisacodyl, [MAR Hold] guaiFENesin-dextromethorphan, Heparin (Porcine) in NaCl, lidocaine (PF), [MAR Hold] ondansetron (ZOFRAN) IV, [MAR Hold] sodium chloride flush, sodium chloride flush    Patient Profile   Elizabeth Melton is a 81 year old with a history of PE, PAF, ILD, COPD, and diastolic CHF with RV failure, and HFpEF.    Admitted with A/C  respiratory failure + A/C HFpEF  Assessment/Plan   1. A/C Hypoxic Respiratory Failure: On 5 liters at home. Placed on Bipap in the ED and had weaned back to 5 liters. Currently on HFNC 9L. Multifactorial hypoxemia: ILD, PAH and A/c CHF.  She does not appear significantly volume overloaded on exam and RHC today showed optimized filling pressures with ongoing moderate PAH.  No fever, WBCs have not been elevated and PCT has been normal (<0.1 today). She has been restarted on prednisone and doxycycline by pulmonary. CXR yesterday with pulmonary fibrosis, actually some improvement.  - c/w nebs/ bronchodilators  - Prednisone per pulmonary.  - doxycyline per pulmonary - Restart torsemide 40 mg daily today.  2.  A/C HFpEF---> RV Failure: Echo in 11/22 showed EF 60-65%, mildly decreased RV systolic function with mild RV enlargement, mild Elizabeth, moderate TR, PASP 85 mmHg, mild aortic stenosis and mild aortic insufficiency, IVC dilated. The RV failure may be primarily due to pulmonary hypertension. RHC in 12/22 showed normal filling pressures with severe PH.  She was diuresed here, not volume overloaded by exam and repeated RHC today showed normal filling pressures, moderate PAH.  - Torsemide 40 mg daily for now.  3. PAH: Pulmonary venous hypertension on 7/22 RHC.  However, last echo in 11/22 showed PASP up to 85 mmHg. No evidence for chronic PE on 9/22 V/Q scan.  RHC in 12/22 showed normal filling pressures with severe pulmonary arterial hypertension.  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   RHC today showed ongoing moderate PAH. - With PH-ILD, a trial of Tyvaso-DPI planned. Tyvaso application pending.  4. PAF: Maintaining SR.  -On Eliquis 5 mg twice a day.  5. ILD: Followed by Dr. [name].  Seen by pulmonary here, doubted acute ILD flare. IV methylprednisolone stopped  6. DMII -On SSI 7. AKI on CKD Stage IIIa: Creatinine up to 1.74, ?overdiuresis. Now back to 1.4.  -  torsemide 40 mg daily.  8.   Hypothyroidism - On Levothyroxine.  9.Tobacco Abuse: Last smoked 2 weeks ago.  10. Back pain: Her main complaint. Per [facility].   Cardiology will see again Monday unless called.   Disposition: SNF when stable.   Length of Stay: [place] 01/06/2022 8:00 AM

## 2022-01-06 NOTE — Plan of Care (Signed)
Problem: Nutrition: Goal: Adequate nutrition will be maintained Outcome: Completed/Met   Problem: Clinical Measurements: Goal: Will remain free from infection Outcome: Completed/Met

## 2022-01-06 NOTE — Progress Notes (Signed)
NAME:  Elizabeth Melton, MRN:  [id number], DOB:  March 22, 1941, LOS: 1 ADMISSION DATE:  12/28/2021, CONSULTATION DATE:  1/19 REFERRING MD:  Dr. Broadus John, CHIEF COMPLAINT:  Acute respiratory failure w/ hypoxia   History of Present Illness:  Patient is a 81 year-old female with pertinent PMH chronic diastolic CHF (patient of Dr. Aundra Dubin), severe pulm hypertension, chronic respiratory failure, COPD, ILD (patient of Dr. [name]) on 5L O2 presents to [facility] on 1/18 with respiratory distress.  On 1/18 admitted to [facility] ED.  Sats initially 85% on 5L [place].  Her sats are typically 88 to 89%.  Patient placed on BiPAP.  States she missed some of her water pills.  Denies fever.  BNP 565.  CXR showing chronic lung injury.  Lasix given.  Albuterol neb given.  Initially no signs of eczema/ILD flare.  Admitted to [facility] on the floors.   On 1/19 patient continuing to have desaturation episodes and requiring BiPAP more often.  Patient now on salter HFNC 8L with sats 90%.  Questionable ILD is cause of respiratory distress.  PCCM asked for pulm consult.  Continuing diuresis with slight upward trend to his BUN/creatinine   Pertinent  Medical History        Past Medical History:  Diagnosis Date   Acute on chronic diastolic (congestive) heart failure ([place]) 05/19/2021   Acute respiratory failure with hypoxia (HCC) 09/06/2020   Atrial fibrillation with RVR (HCC)     Diabetes mellitus without complication (HCC)     Hyperlipidemia     Hypertension     Hypothyroidism     IPF (idiopathic pulmonary fibrosis) ([place])     Pneumonia due to COVID-19 virus 01/16/2021   Thrombocytopenia ([place])          Significant Hospital Events: Including procedures, antibiotic start and stop dates in addition to other pertinent events   1/18: admitted to [facility] 1/19: PCCM consulted for inability to wean from BiPAP 1/20: on salter HFNC 8L 1/26 increased to 9 L   Interim History / Subjective:  No overnight events Denies any significant  deterioration Short of breath with activity Still with a cough with some phlegm production   Objective   Blood pressure (!) 143/51, pulse 80, temperature (!) 97.3 F (36.3 C), temperature source Oral, resp. rate (!) 23, height _0  (1.676 m), weight 75.7 kg, SpO2 90 %.    >  FiO2 (%):  [40 %] 40 %      Intake/Output Summary (Last 24 hours) at 12/30/2021 0855 Last data filed at 12/30/2021 0315    Gross per 24 hour  Intake 960 ml  Output 2050 ml  Net -1090 ml           Filed Weights    12/28/21 2006 12/29/21 0513 12/30/21 0330  Weight: 75.5 kg 75.1 kg 75.7 kg      Examination: General: Elderly lady, does not appear to be in distress HEENT: Moist oral mucosa Neuro: Alert and oriented x3 moving all extremities CV: S1-S2 appreciated PULM: Clear breath sounds GI: bowel sounds appreciated   CXR 1/18: diffuse interstitial thickening Last chest x-ray noted Last CT scan of his chest was 07/13/2021-reviewed by myself consistent with ILD  Pulmonary function test 08/02/2021-mild obstructive disease with severe reduction in diffusing capacity   Resolved Hospital Problem list       Assessment & Plan:   Acute hypoxemic respiratory failure Underlying pulmonary fibrosis Acute on chronic diastolic congestive heart failure with biventricular failure -Has been tolerating diuresis, concern with slight  bump in creatinine -Tolerating BiPAP nightly -Still needs to be kept dry  New cough and sputum production Started on antibiotics and steroids for possible bronchitis -Continue doxycycline -Continue oral steroids -No leukocytosis, no fever -Expected sputum not showing any organisms at present  Severe pulmonary hypertension Right-sided heart failure -Being followed by heart failure team  Chronic obstructive pulmonary disease with exacerbation -Continue bronchodilators/steroids/antibiotics  Interstitial lung disease -Follows up with Dr. [name] as outpatient-has appointment  scheduled for 2/10 -Continue oxygen supplementation- -To follow-up as outpatient -to consider antifibrotic's as outpatient  Sherrilyn Rist, MD [place] [facility] Pager: See Shea Evans

## 2022-01-06 NOTE — Progress Notes (Signed)
Pt stated that she does not want to wear the BiPAP at all, states that it makes her feel uncomfortable and she does not want it on.
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
## 2022-01-06 NOTE — Interval H&P Note (Signed)
History and Physical Interval Note:  01/06/2022 7:32 AM  Elizabeth Melton  has presented today for surgery, with the diagnosis of chf.  The various methods of treatment have been discussed with the patient and family. After consideration of risks, benefits and other options for treatment, the patient has consented to  Procedure(s): RIGHT HEART CATH (N/A) as a surgical intervention.  The patient's history has been reviewed, patient examined, no change in status, stable for surgery.  I have reviewed the patient's chart and labs.  Questions were answered to the patient's satisfaction.     Jaqualyn Juday [employer]

## 2022-01-06 NOTE — Progress Notes (Signed)
Patient ID: [name], female   DOB: 03/18/1941, 81 y.o.   MRN: [id number]    Progress Note from the Palliative Medicine Team at [facility]   Patient Name: Elizabeth Melton        Date: 01/06/2022 DOB: 09-05-1941  Age: 81 y.o. MRN#: [id number] Attending Physician: Aline August, MD Primary Care Physician: Kathyrn Lass, MD Admit Date: 12/28/2021   Medical records reviewed  Patient is a 81 year-old female with pertinent PMH chronic diastolic CHF (patient of Dr. Aundra Dubin), severe pulm hypertension, chronic respiratory failure, COPD, ILD (patient of Dr. [name]) on 5L O2 presents to [facility] on 1/18 with respiratory distress.      This NP visited patient at the bedside as a follow up to  yesterday's [place].  Ms Mitchell appears tired today with increased O2 needs/HFNC 8 liters, Creatine 1.74  Education again offered on her serious lung and heart diseases (ILD, COPD, and diastolic CHF with RV failure) and the natural trajectory.  We discussed the limitations of medical interventions to prolong quality of life when a body fails to thrive.  Education offered on the difference between an aggressive medical intervention path and a palliative comfort path for this patient at this time in this situation.  She understands this.  She remains hopeful for medical management of her chronic diseases and trusts in her faith to guide her in her decisions.  I spoke to her daughter/Silvia and was able to set up a family meeting for tomorrow at 0930.  Lonzo Candy NP PMT  will meet with family at bedside.   Questions and concerns addressed     Wadie Lessen NP  Palliative Medicine Team Team Phone # [phone] Pager [phone]

## 2022-01-06 NOTE — Progress Notes (Addendum)
Patient ID: Elizabeth Melton, female   DOB: [birth year], 81 y.o.   MRN: [id number]  PROGRESS NOTE    Elizabeth Melton  TKZ:[id number] DOB: [birth year] DOA: 12/28/2021 PCP: Kathyrn Lass, MD   Brief Narrative:  81 year old female with history of COPD, interstitial lung disease, chronic respiratory failure with hypoxia on 4 to 5 L oxygen via nasal cannula at home, severe pulmonary hypertension, chronic diastolic CHF, diabetes mellitus type 2, hypertension, paroxysmal A. fib, history of pulmonary embolism on Eliquis, PAD presented with worsening shortness of breath.  EMS found her with oxygen saturations of 85% on 5 L, she was placed on BiPAP and transferred to the ED.  She had missed few doses of diuretics at home.  In ED, chest x-ray showed chronic findings; labs were unremarkable.  Pulmonary and cardiology were consulted.  She was started on IV Lasix with modest response.  Subsequently, pulmonary has signed off.  She has already been switched to oral torsemide by heart failure team.  PT recommended SNF placement.  Assessment & Plan:   Acute on chronic hypoxic respiratory failure Acute on chronic diastolic heart failure Severe pulmonary artery hypertension -Recent right heart cath on 11/23/2021 showed normal filling pressures, severe PAH and with low cardiac output.  Plan was to add Tyvaso as an outpatient. -Echo in 11/202 showed EF of 60 to 65% with moderate TR, PASP of 85 mmHg -Missed few doses of diuretics at home -Heart failure team following.  Right heart catheterization today showed severe ongoing pulmonary hypertension.  Torsemide has been changed to 40 mg once daily.  Continue metoprolol succinate.  Strict input and output.  Daily weights.  Fluid restriction.  Negative balance of 9600 cc since presentation -Oxygen requirement still extremely high: Currently on 9 L oxygen via high flow nasal cannula.  Normally wears 5 L oxygen at home.  Wean off as able.   -Pulmonary was reconsulted on  01/05/2022: Patient was started on oral doxycycline and prednisone. -Overall prognosis is guarded to poor.  Palliative care following.  Currently remains full code.  Acute on low chronic back pain -Appears musculoskeletal; no history of injury or fall -Lumbar spine and sacrum/coccyx x-rays on 01/03/2022 showed no fractures; showed first-degree anterolisthesis at L4-L5 level along with disc space narrowing. -Continue Voltaren gel, Robaxin and lidocaine patch. -Continue physical therapy. -Will need SNF placement  AKI on CKD stage IIIa -creatinine improving to 1.40 today.  Diuretic plan as above.  Monitor  Hyponatremia -Resolved.  Monitor.  Paroxysmal A. Fib -Currently rate controlled.  Continue Toprol-XL and Eliquis  COPD Interstitial lung disease -Outpatient follow-up with pulmonary.  Considering antifibrotic therapy as an outpatient.  Tobacco abuse -Continue nicotine patch.  Essential hypertension -Blood pressure stable.  Continue Toprol-XL and diuretics  Diabetes mellitus type 2 with hypoglycemia -A1c 7.6 in 10/2021.  Continue long-acting insulin along with CBGs with SSI  Hypothyroidism -Continue levothyroxine  Hyperlipidemia/PAD -Continue pravastatin   DVT prophylaxis: Eliquis Code Status: Full Family Communication: Niece at bedside on 01/05/2022 Disposition Plan: Status is: Inpatient  Remains inpatient appropriate because: Of need for SNF placement   Consultants: Heart failure team/pulmonary/palliative care  Procedures: Right heart catheterization on 01/06/2022  Antimicrobials: None   Subjective: Patient seen and examined at bedside.  Poor historian.  Still complains of back pain.  Still constipated, Dulcolax did not work yesterday.  Denies chest pain, nausea, vomiting or fever.  Still short of breath with exertion. Objective: Vitals:   01/06/22 5732 01/06/22 0727 01/06/22 0817 01/06/22 0833  BP:  108/73   Pulse:      Resp:      Temp:   97.7 F (36.5 C)    TempSrc:   Oral   SpO2: 94% 95%  97%  Weight:      Height:        Intake/Output Summary (Last 24 hours) at 01/06/2022 0929 Last data filed at 01/06/2022 0383 Gross per 24 hour  Intake 360 ml  Output 1250 ml  Net -890 ml    Filed Weights   01/04/22 0442 01/05/22 0500 01/05/22 2331  Weight: 73 kg 72.8 kg 74.7 kg    Examination:  General exam: Appears chronically ill and deconditioned.  Currently on 9 L oxygen via high flow nasal cannula.  No acute distress.  Respiratory system: Bilateral decreased breath sounds at bases with scattered crackles  cardiovascular system: S1-S2 heard; rate controlled currently gastrointestinal system: Abdomen is mildly distended; soft and nontender.  Normal bowel sounds heard Extremities: No cyanosis; bilateral lower extremity edema present  Central nervous system: Awake; slow to respond.  Poor historian.  No focal neurological deficits.  Moving extremities  skin: No obvious petechiae/rashes psychiatry: Affect is flat  Data Reviewed: I have personally reviewed following labs and imaging studies  CBC: Recent Labs  Lab 01/02/22 0422 01/03/22 0401 01/05/22 0456  WBC 8.9 8.4 8.2  HGB 15.6* 14.9 15.7*  HCT 48.6* 44.8 48.4*  MCV 102.7* 101.8* 103.0*  PLT 155 187 338    Basic Metabolic Panel: Recent Labs  Lab 01/01/22 0150 01/02/22 0422 01/03/22 0401 01/04/22 0452 01/05/22 0456 01/06/22 0324  NA 138 133* 133* 133* 134* 135  K 4.3 4.3 4.4 4.1 4.1 4.2  CL 102 101 99 97* 99 98  CO2 _0 GLUCOSE 127* 202* 134* 103* 209* 111*  BUN 29* 35* 33* 32* 47* 41*  CREATININE 1.16* 1.28* 1.35* 1.38* 1.74* 1.40*  CALCIUM 8.7* 8.3* 8.3* 8.7* 8.7* 8.9  MG 2.7*  --   --   --  2.3 2.6*    GFR: Estimated Creatinine Clearance: 33.7 mL/min (A) (by C-G formula based on SCr of 1.4 mg/dL (H)). Liver Function Tests: No results for input(s): AST, ALT, ALKPHOS, BILITOT, PROT, ALBUMIN in the last 168 hours.  No results for input(s): LIPASE,  AMYLASE in the last 168 hours. No results for input(s): AMMONIA in the last 168 hours. Coagulation Profile: No results for input(s): INR, PROTIME in the last 168 hours. Cardiac Enzymes: No results for input(s): CKTOTAL, CKMB, CKMBINDEX, TROPONINI in the last 168 hours. BNP (last 3 results) No results for input(s): PROBNP in the last 8760 hours. HbA1C: No results for input(s): HGBA1C in the last 72 hours. CBG: Recent Labs  Lab 01/05/22 0507 01/05/22 1152 01/05/22 1614 01/05/22 2112 01/06/22 0601  GLUCAP 213* 150* 183* 175* 136*    Lipid Profile: No results for input(s): CHOL, HDL, LDLCALC, TRIG, CHOLHDL, LDLDIRECT in the last 72 hours. Thyroid Function Tests: No results for input(s): TSH, T4TOTAL, FREET4, T3FREE, THYROIDAB in the last 72 hours. Anemia Panel: No results for input(s): VITAMINB12, FOLATE, FERRITIN, TIBC, IRON, RETICCTPCT in the last 72 hours. Sepsis Labs: Recent Labs  Lab 01/05/22 0456 01/06/22 0324  PROCALCITON <0.10 <0.10     Recent Results (from the past 240 hour(s))  Resp Panel by RT-PCR (Flu A&B, Covid) Nasopharyngeal Swab     Status: None   Collection Time: 12/28/21  3:50 PM   Specimen: Nasopharyngeal Swab; Nasopharyngeal(NP) swabs in vial transport medium  Result  Value Ref Range Status   SARS Coronavirus 2 by RT PCR NEGATIVE NEGATIVE Final    Comment: (NOTE) SARS-CoV-2 target nucleic acids are NOT DETECTED.  The SARS-CoV-2 RNA is generally detectable in upper respiratory specimens during the acute phase of infection. The lowest concentration of SARS-CoV-2 viral copies this assay can detect is 138 copies/mL. A negative result does not preclude SARS-Cov-2 infection and should not be used as the sole basis for treatment or other patient management decisions. A negative result may occur with  improper specimen collection/handling, submission of specimen other than nasopharyngeal swab, presence of viral mutation(s) within the areas targeted by this  assay, and inadequate number of viral copies(<138 copies/mL). A negative result must be combined with clinical observations, patient history, and epidemiological information. The expected result is Negative.  Fact Sheet for Patients:  EntrepreneurPulse.com.au  Fact Sheet for Healthcare Providers:  IncredibleEmployment.be  This test is no t yet approved or cleared by the [place] FDA and  has been authorized for detection and/or diagnosis of SARS-CoV-2 by FDA under an Emergency Use Authorization (EUA). This EUA will remain  in effect (meaning this test can be used) for the duration of the COVID-19 declaration under Section 564(b)(1) of the Act, 21 U.S.C.section 360bbb-3(b)(1), unless the authorization is terminated  or revoked sooner.       Influenza A by PCR NEGATIVE NEGATIVE Final   Influenza B by PCR NEGATIVE NEGATIVE Final    Comment: (NOTE) The Xpert Xpress SARS-CoV-2/FLU/RSV plus assay is intended as an aid in the diagnosis of influenza from Nasopharyngeal swab specimens and should not be used as a sole basis for treatment. Nasal washings and aspirates are unacceptable for Xpert Xpress SARS-CoV-2/FLU/RSV testing.  Fact Sheet for Patients: EntrepreneurPulse.com.au  Fact Sheet for Healthcare Providers: IncredibleEmployment.be  This test is not yet approved or cleared by the [place] FDA and has been authorized for detection and/or diagnosis of SARS-CoV-2 by FDA under an Emergency Use Authorization (EUA). This EUA will remain in effect (meaning this test can be used) for the duration of the COVID-19 declaration under Section 564(b)(1) of the Act, 21 U.S.C. section 360bbb-3(b)(1), unless the authorization is terminated or revoked.  Performed at [facility], [place] [address], [place], [place] [address]   Expectorated Sputum Assessment w Gram Stain, Rflx to Resp Cult     Status: None    Collection Time: 01/05/22 11:18 AM   Specimen: Expectorated Sputum  Result Value Ref Range Status   Specimen Description EXPECTORATED SPUTUM  Final   Special Requests NONE  Final   Sputum evaluation   Final    THIS SPECIMEN IS ACCEPTABLE FOR SPUTUM CULTURE Performed at [facility], [place] [address], [place], [place] [address]    Report Status 01/05/2022 FINAL  Final  Culture, Respiratory w Gram Stain     Status: None (Preliminary result)   Collection Time: 01/05/22 11:18 AM  Result Value Ref Range Status   Specimen Description EXPECTORATED SPUTUM  Final   Special Requests NONE Reflexed from [id number]  Final   Gram Stain   Final    RARE SQUAMOUS EPITHELIAL CELLS PRESENT FEW WBC PRESENT,BOTH PMN AND MONONUCLEAR MODERATE GRAM POSITIVE COCCI FEW GRAM NEGATIVE RODS RARE YEAST RARE GRAM POSITIVE RODS    Culture   Final    CULTURE REINCUBATED FOR BETTER GROWTH Performed at [facility], [place] [address], [place], [place] [address]    Report Status PENDING  Incomplete  Radiology Studies: DG Chest 2 View  Result Date: 01/05/2022 CLINICAL DATA:  Shortness of breath EXAM: CHEST - 2 VIEW COMPARISON:  Previous studies including the examination of 12/28/2021 FINDINGS: Transverse diameter of heart is increased. There is diffuse increase in interstitial markings in both lungs. There is interval improvement in aeration in the right mid and right lower lung fields. No new focal pulmonary consolidation is seen. Lateral CP angles are clear. There is no pneumothorax. IMPRESSION: Pulmonary fibrosis. Cardiomegaly. There is interval improvement in aeration in the right parahilar region and right lower lung fields suggesting resolving pulmonary edema or resolving pneumonia superimposed over pulmonary fibrosis. Electronically Signed   By: Elmer Picker M.D.   On: 01/05/2022 13:34   CARDIAC CATHETERIZATION  Result Date: 01/06/2022 1. Optimized filling pressures. 2. Severe pulmonary  arterial hypertension        Scheduled Meds:  apixaban  5 mg Oral BID   arformoterol  15 mcg Nebulization BID   And   umeclidinium bromide  1 puff Inhalation Daily   budesonide (PULMICORT) nebulizer solution  0.25 mg Nebulization BID   buPROPion  150 mg Oral Daily   diclofenac Sodium  2 g Topical TID   doxycycline  100 mg Oral Q12H   guaiFENesin  600 mg Oral BID   insulin aspart  0-15 Units Subcutaneous TID WC   insulin glargine-yfgn  20 Units Subcutaneous Daily   ipratropium-albuterol  3 mL Nebulization BID   levothyroxine  75 mcg Oral QAC breakfast   lidocaine  2 patch Transdermal QHS   methocarbamol  1,000 mg Oral BID   metoprolol succinate  12.5 mg Oral Daily   nicotine  14 mg Transdermal Daily   polyethylene glycol  17 g Oral Daily   pravastatin  40 mg Oral q1800   predniSONE  20 mg Oral Q breakfast   senna-docusate  1 tablet Oral BID   sodium chloride flush  3 mL Intravenous Q12H   sodium chloride flush  3 mL Intravenous Q12H   torsemide  40 mg Oral Daily   Continuous Infusions:  sodium chloride     sodium chloride            Aline August, MD [facility] 01/06/2022, 9:29 AM

## 2022-01-07 DIAGNOSIS — J9621 Acute and chronic respiratory failure with hypoxia: Secondary | ICD-10-CM | POA: Diagnosis not present

## 2022-01-07 DIAGNOSIS — J849 Interstitial pulmonary disease, unspecified: Secondary | ICD-10-CM | POA: Diagnosis not present

## 2022-01-07 DIAGNOSIS — I272 Pulmonary hypertension, unspecified: Secondary | ICD-10-CM | POA: Diagnosis not present

## 2022-01-07 LAB — BASIC METABOLIC PANEL
Anion gap: 11 (ref 5–15)
BUN: 40 mg/dL — ABNORMAL HIGH (ref 8–23)
CO2: 25 mmol/L (ref 22–32)
Calcium: 9.1 mg/dL (ref 8.9–10.3)
Chloride: 99 mmol/L (ref 98–111)
Creatinine, Ser: 1.34 mg/dL — ABNORMAL HIGH (ref 0.44–1.00)
GFR, Estimated: 40 mL/min — ABNORMAL LOW (ref >60)
Glucose, Bld: 102 mg/dL — ABNORMAL HIGH (ref 70–99)
Potassium: 4.1 mmol/L (ref 3.5–5.1)
Sodium: 135 mmol/L (ref 135–145)

## 2022-01-07 LAB — GLUCOSE, CAPILLARY
Glucose-Capillary: 108 mg/dL — ABNORMAL HIGH (ref 70–99)
Glucose-Capillary: 256 mg/dL — ABNORMAL HIGH (ref 70–99)
Glucose-Capillary: 207 mg/dL — ABNORMAL HIGH (ref 70–99)
Glucose-Capillary: 73 mg/dL (ref 70–99)

## 2022-01-07 LAB — CULTURE, RESPIRATORY W GRAM STAIN: Culture: NORMAL

## 2022-01-07 LAB — PROCALCITONIN: Procalcitonin: <0.1 ng/mL

## 2022-01-07 LAB — MAGNESIUM: Magnesium: 2.6 mg/dL — ABNORMAL HIGH (ref 1.7–2.4)

## 2022-01-07 MED ORDER — SENNOSIDES-DOCUSATE SODIUM 8.6-50 MG PO TABS
2.0000 | ORAL_TABLET | Freq: Two times a day (BID) | ORAL | Status: DC
  Administered 2022-01-07 – 2022-01-11 (×9): 2 via ORAL
  Filled 2022-01-07 (×11): qty 2

## 2022-01-07 MED ORDER — METHOCARBAMOL 500 MG PO TABS
1000.0000 mg | ORAL_TABLET | Freq: Four times a day (QID) | ORAL | Status: DC | PRN
  Administered 2022-01-07 – 2022-01-12 (×6): 1000 mg via ORAL
  Filled 2022-01-07 (×6): qty 2

## 2022-01-07 MED ORDER — TRAMADOL HCL 50 MG PO TABS
50.0000 mg | ORAL_TABLET | Freq: Four times a day (QID) | ORAL | Status: DC | PRN
  Administered 2022-01-07 – 2022-01-12 (×8): 50 mg via ORAL
  Filled 2022-01-07 (×9): qty 1

## 2022-01-07 MED ORDER — HYDROMORPHONE HCL 1 MG/ML IJ SOLN
0.5000 mg | INTRAMUSCULAR | Status: DC | PRN
  Administered 2022-01-07: 0.5 mg via INTRAVENOUS
  Filled 2022-01-07: qty 0.5

## 2022-01-07 MED ORDER — DULOXETINE HCL 30 MG PO CPEP
30.0000 mg | ORAL_CAPSULE | Freq: Every day | ORAL | Status: DC
  Administered 2022-01-07 – 2022-01-13 (×7): 30 mg via ORAL
  Filled 2022-01-07 (×7): qty 1

## 2022-01-07 MED ORDER — POLYETHYLENE GLYCOL 3350 17 G PO PACK
17.0000 g | PACK | Freq: Two times a day (BID) | ORAL | Status: DC
  Administered 2022-01-07 – 2022-01-11 (×6): 17 g via ORAL
  Filled 2022-01-07 (×10): qty 1

## 2022-01-07 NOTE — Progress Notes (Signed)
NAME:  Elizabeth Melton, MRN:  [id number], DOB:  1941-03-03, LOS: 1 ADMISSION DATE:  12/28/2021, CONSULTATION DATE:  1/19 REFERRING MD:  Dr. Broadus John, CHIEF COMPLAINT:  Acute respiratory failure w/ hypoxia   History of Present Illness:  Patient is a 81 year-old female with pertinent PMH chronic diastolic CHF (patient of Dr. Aundra Dubin), severe pulm hypertension, chronic respiratory failure, COPD, ILD (patient of Dr. [name]) on 5L O2 presents to [facility] on 1/18 with respiratory distress.  On 1/18 admitted to [facility] ED.  Sats initially 85% on 5L [place].  Her sats are typically 88 to 89%.  Patient placed on BiPAP.  States she missed some of her water pills.  Denies fever.  BNP 565.  CXR showing chronic lung injury.  Lasix given.  Albuterol neb given.  Initially no signs of eczema/ILD flare.  Admitted to [facility] on the floors.   On 1/19 patient continuing to have desaturation episodes and requiring BiPAP more often.  Patient now on salter HFNC 8L with sats 90%.  Questionable ILD is cause of respiratory distress.  PCCM asked for pulm consult.  Continuing diuresis with slight upward trend to his BUN/creatinine   Pertinent  Medical History        Past Medical History:  Diagnosis Date   Acute on chronic diastolic (congestive) heart failure ([place]) 05/19/2021   Acute respiratory failure with hypoxia ([facility]) 09/06/2020   Atrial fibrillation with RVR ([place])     Diabetes mellitus without complication ([place])     Hyperlipidemia     Hypertension     Hypothyroidism     IPF (idiopathic pulmonary fibrosis) ([place])     Pneumonia due to COVID-19 virus 01/16/2021   Thrombocytopenia ([place])          Significant Hospital Events: Including procedures, antibiotic start and stop dates in addition to other pertinent events   1/18: admitted to [facility] 1/19: PCCM consulted for inability to wean from BiPAP 1/20: on salter HFNC 8L 1/26 increased to 9 L   Interim History / Subjective:   Sitting out of bed to chair Denies increased dyspnea  but remains on 9 L nasal cannula high flow Afebrile  Objective    Vitals:   01/07/22 0824 01/07/22 0944  BP:  124/69  Pulse:  61  Resp:  20  Temp:  (!) 97.3 F (36.3 C)  SpO2: 96% 90%           Filed Weights    12/28/21 2006 12/29/21 0513 12/30/21 0330  Weight: 75.5 kg 75.1 kg 75.7 kg      Examination: General: Elderly lady, sitting in a chair, no distress, on high flow nasal cannula HEENT: Moist oral mucosa Neuro: Alert and oriented x3 , grossly nonfocal CV: S1-S2 appreciated PULM: Bibasilar dry crackles GI: Soft, nontender No edema   Chest x-ray 1/26 shows improved aeration right hilar and lower lobe suggesting improvement of edema versus pneumonia Last CT scan of his chest was 07/13/2021-reviewed by myself consistent with 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  Pulmonary function test 08/02/2021-mild obstructive disease with ratio 66, FEV1 1.18/76%, severe reduction in diffusing capacity 5.7/30%   Resolved Hospital Problem list       Assessment & Plan:   Acute on chronic hypoxemic respiratory failure -on 3 to 4 L of oxygen at home  -Oxygen requirements remain high although fluid and chest x-ray has improved , titrate FiO2 to saturation 92% and above   Acute on chronic diastolic congestive heart failure with biventricular failure -Continue torsemide 40 mg daily  Chronic obstructive pulmonary disease with exacerbation New cough and sputum production Started on antibiotics and steroids for possible bronchitis -Continue doxycycline -Continue oral prednisone 20 mg -No leukocytosis, no fever -Expected sputum not showing any organisms at present  Severe pulmonary hypertension Acute cor pulmonale RHC showed PVR 9.7 [place] units, PCWP 9, PA 68/27 with mean of 75 -Being followed by heart failure team , can be a candidate for Tyvaso as outpatient   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 [name] as outpatient-has appointment scheduled for 2/10 --Per  office visit 1/5 with MR, antifibrotic's were being considered in the future -Not convincing for IPF flare given fluctuating oxygen requirements but if no improvement can consider high-dose steroids  Summary: Overall prognosis is guarded in the setting of [place] -IPF , although advanced therapies such as Tyvaso and antifibrotic's are being considered as outpatient, would continue palliative conversation concurrently. Mild improvement in chest x-ray suggest that there was an acute process?  Edema versus pneumonia which is improved but oxygen requirements remain high  PCCM to see again on Monday  Kara Mead MD. [facility] [place] [facility] Pager : [phone]  If no response to pager , please call [phone] until 7 pm After 7:00 pm call Elink  [phone]   01/07/2022

## 2022-01-07 NOTE — Progress Notes (Signed)
Patient alert, oriented. OOB to chair for majority of day shift. Patient had a large bowel movement today, able to use BSC. Medicated for back pain/ spasm, awaiting relief. Voiding with purewick. Call bell within reach.   Problem: Education: Goal: Ability to demonstrate management of disease process will improve Outcome: Progressing Goal: Ability to verbalize understanding of medication therapies will improve Outcome: Progressing

## 2022-01-07 NOTE — Progress Notes (Signed)
Patient ID: Elizabeth Melton, female   DOB: [birth year], 82 y.o.   MRN: [id number]  PROGRESS NOTE    Elizabeth Melton  MVH:[id number] DOB: Jan 25, 1941 DOA: 12/28/2021 PCP: Kathyrn Lass, MD   Brief Narrative:  81 year old female with history of COPD, interstitial lung disease, chronic respiratory failure with hypoxia on 4 to 5 L oxygen via nasal cannula at home, severe pulmonary hypertension, chronic diastolic CHF, diabetes mellitus type 2, hypertension, paroxysmal A. fib, history of pulmonary embolism on Eliquis, PAD presented with worsening shortness of breath.  EMS found her with oxygen saturations of 85% on 5 L, she was placed on BiPAP and transferred to the ED.  She had missed few doses of diuretics at home.  In ED, chest x-ray showed chronic findings; labs were unremarkable.  Pulmonary and cardiology were consulted.  She was started on IV Lasix with modest response.  Subsequently, pulmonary has signed off.  She has already been switched to oral torsemide by heart failure team.  PT recommended SNF placement.  Assessment & Plan:   Acute on chronic hypoxic respiratory failure Acute on chronic diastolic heart failure Severe pulmonary artery hypertension -Recent right heart cath on 11/23/2021 showed normal filling pressures, severe PAH and with low cardiac output.  Plan was to add Tyvaso as an outpatient. -Echo in 11/202 showed EF of 60 to 65% with moderate TR, PASP of 85 mmHg -Missed few doses of diuretics at home -Heart failure team following.  Right heart catheterization today showed severe ongoing pulmonary hypertension.  Torsemide has been changed to 40 mg once daily.  Continue metoprolol succinate.  Strict input and output.  Daily weights.  Fluid restriction.  Negative balance of 8560 cc since presentation -Oxygen requirement worsening: Currently on 10 L oxygen via high flow nasal cannula.  Normally wears 5 L oxygen at home.  Wean off as able.   -Pulmonary was reconsulted on 01/05/2022:  Patient was started on oral doxycycline and prednisone. -Overall prognosis is guarded to poor.  Palliative care following.  Currently remains full code.  Acute on low chronic back pain -Appears musculoskeletal; no history of injury or fall -Lumbar spine and sacrum/coccyx x-rays on 01/03/2022 showed no fractures; showed first-degree anterolisthesis at L4-L5 level along with disc space narrowing. -Continue Voltaren gel, Robaxin and lidocaine patch. -Continue physical therapy. -Will need SNF placement  AKI on CKD stage IIIa -creatinine improving to 1.34 today.  Diuretic plan as above.  Monitor  Hyponatremia -Resolved.  Monitor.  Paroxysmal A. Fib -Currently rate controlled.  Continue Toprol-XL and Eliquis  COPD Interstitial lung disease -Outpatient follow-up with pulmonary.  Considering antifibrotic therapy as an outpatient.  Tobacco abuse -Continue nicotine patch.  Essential hypertension -Blood pressure stable.  Continue Toprol-XL and diuretics  Diabetes mellitus type 2 with hypoglycemia -A1c 7.6 in 10/2021.  Continue long-acting insulin along with CBGs with SSI  Hypothyroidism -Continue levothyroxine  Hyperlipidemia/PAD -Continue pravastatin  Constipation -Continue bowel regimen   DVT prophylaxis: Eliquis Code Status: Full Family Communication: Niece at bedside on 01/05/2022 Disposition Plan: Status is: Inpatient  Remains inpatient appropriate because: Of need for SNF placement   Consultants: Heart failure team/pulmonary/palliative care  Procedures: Right heart catheterization on 01/06/2022  Antimicrobials: None   Subjective: Patient seen and examined at bedside.  Poor historian.  Complains of intermittent back pain.  No fever, vomiting, or diarrhea reported.  Still short of breath with slight exertion.   Objective: Vitals:   01/06/22 2100 01/07/22 0023 01/07/22 0243 01/07/22 0359  BP: (!) 110/46 134/78  Marland Kitchen)  153/55  Pulse: (!) 51   (!) 53  Resp: _0 Temp:  97.8 F (36.6 C)  97.6 F (36.4 C)  TempSrc:  Oral  Oral  SpO2: 95% 95%  98%  Weight:   73.8 kg   Height:        Intake/Output Summary (Last 24 hours) at 01/07/2022 0717 Last data filed at 01/06/2022 2300 Gross per 24 hour  Intake 1440 ml  Output 400 ml  Net 1040 ml    Filed Weights   01/05/22 0500 01/05/22 2331 01/07/22 0243  Weight: 72.8 kg 74.7 kg 73.8 kg    Examination:  General exam: Appears chronically ill and deconditioned.  No acute distress.  10 L oxygen via high flow nasal cannula  respiratory system: Decreased breath sounds at bases bilaterally with some crackles  cardiovascular system: Intermittently bradycardic; S1-S2 heard gastrointestinal system: Abdomen is distended slightly; soft and nontender.  Bowel sounds are heard  extremities: Lower extremity edema present bilaterally; no clubbing Central nervous system: Alert; still slow to respond.  Poor historian.  No focal neurological deficits.  Moves extremities  skin: No obvious ecchymosis/lesions  psychiatry: Flat affect.  No signs of agitation.  Data Reviewed: I have personally reviewed following labs and imaging studies  CBC: Recent Labs  Lab 01/02/22 0422 01/03/22 0401 01/05/22 0456 01/06/22 0751  WBC 8.9 8.4 8.2  --   HGB 15.6* 14.9 15.7* 17.0*   17.0*  HCT 48.6* 44.8 48.4* 50.0*   50.0*  MCV 102.7* 101.8* 103.0*  --   PLT 155 187 206  --     Basic Metabolic Panel: Recent Labs  Lab 01/01/22 0150 01/02/22 0422 01/03/22 0401 01/04/22 0452 01/05/22 0456 01/06/22 0324 01/06/22 0751 01/07/22 0320  NA 138   < > 133* 133* 134* 135 138   139 135  K 4.3   < > 4.4 4.1 4.1 4.2 4.2   4.1 4.1  CL 102   < > 99 97* 99 98  --  99  CO2 28   < > _1 --  25  GLUCOSE 127*   < > 134* 103* 209* 111*  --  102*  BUN 29*   < > 33* 32* 47* 41*  --  40*  CREATININE 1.16*   < > 1.35* 1.38* 1.74* 1.40*  --  1.34*  CALCIUM 8.7*   < > 8.3* 8.7* 8.7* 8.9  --  9.1  MG 2.7*  --   --   --  2.3 2.6*  --   2.6*   < > = values in this interval not displayed.    GFR: Estimated Creatinine Clearance: 35 mL/min (A) (by C-G formula based on SCr of 1.34 mg/dL (H)). Liver Function Tests: No results for input(s): AST, ALT, ALKPHOS, BILITOT, PROT, ALBUMIN in the last 168 hours.  No results for input(s): LIPASE, AMYLASE in the last 168 hours. No results for input(s): AMMONIA in the last 168 hours. Coagulation Profile: No results for input(s): INR, PROTIME in the last 168 hours. Cardiac Enzymes: No results for input(s): CKTOTAL, CKMB, CKMBINDEX, TROPONINI in the last 168 hours. BNP (last 3 results) No results for input(s): PROBNP in the last 8760 hours. HbA1C: No results for input(s): HGBA1C in the last 72 hours. CBG: Recent Labs  Lab 01/06/22 0601 01/06/22 1153 01/06/22 1604 01/06/22 2118 01/07/22 0612  GLUCAP 136* 196* 148* 78 108*    Lipid Profile: No results for input(s): CHOL, HDL, LDLCALC,  TRIG, CHOLHDL, LDLDIRECT in the last 72 hours. Thyroid Function Tests: No results for input(s): TSH, T4TOTAL, FREET4, T3FREE, THYROIDAB in the last 72 hours. Anemia Panel: No results for input(s): VITAMINB12, FOLATE, FERRITIN, TIBC, IRON, RETICCTPCT in the last 72 hours. Sepsis Labs: Recent Labs  Lab 01/05/22 0456 01/06/22 0324 01/07/22 0320  PROCALCITON <0.10 <0.10 <0.10     Recent Results (from the past 240 hour(s))  Resp Panel by RT-PCR (Flu A&B, Covid) Nasopharyngeal Swab     Status: None   Collection Time: 12/28/21  3:50 PM   Specimen: Nasopharyngeal Swab; Nasopharyngeal(NP) swabs in vial transport medium  Result Value Ref Range Status   SARS Coronavirus 2 by RT PCR NEGATIVE NEGATIVE Final    Comment: (NOTE) SARS-CoV-2 target nucleic acids are NOT DETECTED.  The SARS-CoV-2 RNA is generally detectable in upper respiratory specimens during the acute phase of infection. The lowest concentration of SARS-CoV-2 viral copies this assay can detect is 138 copies/mL. A negative result  does not preclude SARS-Cov-2 infection and should not be used as the sole basis for treatment or other patient management decisions. A negative result may occur with  improper specimen collection/handling, submission of specimen other than nasopharyngeal swab, presence of viral mutation(s) within the areas targeted by this assay, and inadequate number of viral copies(<138 copies/mL). A negative result must be combined with clinical observations, patient history, and epidemiological information. The expected result is Negative.  Fact Sheet for Patients:  EntrepreneurPulse.com.au  Fact Sheet for Healthcare Providers:  IncredibleEmployment.be  This test is no t yet approved or cleared by the [place] FDA and  has been authorized for detection and/or diagnosis of SARS-CoV-2 by FDA under an Emergency Use Authorization (EUA). This EUA will remain  in effect (meaning this test can be used) for the duration of the COVID-19 declaration under Section 564(b)(1) of the Act, 21 U.S.C.section 360bbb-3(b)(1), unless the authorization is terminated  or revoked sooner.       Influenza A by PCR NEGATIVE NEGATIVE Final   Influenza B by PCR NEGATIVE NEGATIVE Final    Comment: (NOTE) The Xpert Xpress SARS-CoV-2/FLU/RSV plus assay is intended as an aid in the diagnosis of influenza from Nasopharyngeal swab specimens and should not be used as a sole basis for treatment. Nasal washings and aspirates are unacceptable for Xpert Xpress SARS-CoV-2/FLU/RSV testing.  Fact Sheet for Patients: EntrepreneurPulse.com.au  Fact Sheet for Healthcare Providers: IncredibleEmployment.be  This test is not yet approved or cleared by the [place] FDA and has been authorized for detection and/or diagnosis of SARS-CoV-2 by FDA under an Emergency Use Authorization (EUA). This EUA will remain in effect (meaning this test can be used) for  the duration of the COVID-19 declaration under Section 564(b)(1) of the Act, 21 U.S.C. section 360bbb-3(b)(1), unless the authorization is terminated or revoked.  Performed at [facility], [place] [address] [place], [place] [address]   Expectorated Sputum Assessment w Gram Stain, Rflx to Resp Cult     Status: None   Collection Time: 01/05/22 11:18 AM   Specimen: Expectorated Sputum  Result Value Ref Range Status   Specimen Description EXPECTORATED SPUTUM  Final   Special Requests NONE  Final   Sputum evaluation   Final    THIS SPECIMEN IS ACCEPTABLE FOR SPUTUM CULTURE Performed at [facility], [place] [address] [place], [place] [address]    Report Status 01/05/2022 FINAL  Final  Culture, Respiratory w Gram Stain     Status: None (Preliminary result)  Collection Time: 01/05/22 11:18 AM  Result Value Ref Range Status   Specimen Description EXPECTORATED SPUTUM  Final   Special Requests NONE Reflexed from [id number]  Final   Gram Stain   Final    RARE SQUAMOUS EPITHELIAL CELLS PRESENT FEW WBC PRESENT,BOTH PMN AND MONONUCLEAR MODERATE GRAM POSITIVE COCCI FEW GRAM NEGATIVE RODS RARE YEAST RARE GRAM POSITIVE RODS    Culture   Final    CULTURE REINCUBATED FOR BETTER GROWTH Performed at [facility], [place] [address] [place], [place] [address]    Report Status PENDING  Incomplete          Radiology Studies: DG Chest 2 View  Result Date: 01/05/2022 CLINICAL DATA:  Shortness of breath EXAM: CHEST - 2 VIEW COMPARISON:  Previous studies including the examination of 12/28/2021 FINDINGS: Transverse diameter of heart is increased. There is diffuse increase in interstitial markings in both lungs. There is interval improvement in aeration in the right mid and right lower lung fields. No new focal pulmonary consolidation is seen. Lateral CP angles are clear. There is no pneumothorax. IMPRESSION: Pulmonary fibrosis. Cardiomegaly. There is interval improvement in aeration in the  right parahilar region and right lower lung fields suggesting resolving pulmonary edema or resolving pneumonia superimposed over pulmonary fibrosis. Electronically Signed   By: Elmer Picker M.D.   On: 01/05/2022 13:34   CARDIAC CATHETERIZATION  Result Date: 01/06/2022 1. Optimized filling pressures. 2. Severe pulmonary arterial hypertension        Scheduled Meds:  apixaban  5 mg Oral BID   arformoterol  15 mcg Nebulization BID   And   umeclidinium bromide  1 puff Inhalation Daily   budesonide (PULMICORT) nebulizer solution  0.25 mg Nebulization BID   buPROPion  150 mg Oral Daily   diclofenac Sodium  2 g Topical TID   doxycycline  100 mg Oral Q12H   guaiFENesin  600 mg Oral BID   insulin aspart  0-15 Units Subcutaneous TID WC   insulin glargine-yfgn  20 Units Subcutaneous Daily   ipratropium-albuterol  3 mL Nebulization BID   levothyroxine  75 mcg Oral QAC breakfast   lidocaine  2 patch Transdermal QHS   methocarbamol  1,000 mg Oral BID   metoprolol succinate  12.5 mg Oral Daily   nicotine  14 mg Transdermal Daily   polyethylene glycol  17 g Oral Daily   pravastatin  40 mg Oral q1800   predniSONE  20 mg Oral Q breakfast   senna-docusate  1 tablet Oral BID   sodium chloride flush  3 mL Intravenous Q12H   sodium chloride flush  3 mL Intravenous Q12H   torsemide  40 mg Oral Daily   Continuous Infusions:  sodium chloride     sodium chloride            Aline August, MD [facility] 01/07/2022, 7:17 AM

## 2022-01-07 NOTE — Progress Notes (Signed)
Pt refused bipap for the evening. RT will cont to monitor.

## 2022-01-07 NOTE — Progress Notes (Signed)
Patient requesting additional medication for back pain. This RN has given all scheduled and PRN medications including positioning. Patient states it is not working and if she is unable to get relief, she is leaving the hospital. Dr Tonie Griffith paged and made aware of patient's request. Awaiting new orders.

## 2022-01-07 NOTE — Progress Notes (Signed)
Mobility Specialist Progress Note:   01/07/22 1152  Mobility  Bed Position Chair  Activity Ambulated with assistance in hallway  Level of Assistance Standby assist, set-up cues, supervision of patient - no hands on  Assistive Device Four wheel walker  Distance Ambulated (ft) 500 ft  Activity Response Tolerated well  $Mobility charge 1 Mobility   Pt received in chair willing to participate in mobility. No complaints of pain and asymptomatic. Pt left in chair with call bell in reach and all needs met.   [facility] Public librarian Phone [phone] Secondary Phone [phone]

## 2022-01-08 DIAGNOSIS — I739 Peripheral vascular disease, unspecified: Secondary | ICD-10-CM

## 2022-01-08 LAB — GLUCOSE, CAPILLARY
Glucose-Capillary: 89 mg/dL (ref 70–99)
Glucose-Capillary: 169 mg/dL — ABNORMAL HIGH (ref 70–99)
Glucose-Capillary: 95 mg/dL (ref 70–99)
Glucose-Capillary: 126 mg/dL — ABNORMAL HIGH (ref 70–99)

## 2022-01-08 LAB — BASIC METABOLIC PANEL
Anion gap: 10 (ref 5–15)
BUN: 52 mg/dL — ABNORMAL HIGH (ref 8–23)
CO2: 24 mmol/L (ref 22–32)
Calcium: 8.8 mg/dL — ABNORMAL LOW (ref 8.9–10.3)
Chloride: 101 mmol/L (ref 98–111)
Creatinine, Ser: 1.32 mg/dL — ABNORMAL HIGH (ref 0.44–1.00)
GFR, Estimated: 41 mL/min — ABNORMAL LOW (ref >60)
Glucose, Bld: 60 mg/dL — ABNORMAL LOW (ref 70–99)
Potassium: 3.9 mmol/L (ref 3.5–5.1)
Sodium: 135 mmol/L (ref 135–145)

## 2022-01-08 LAB — MAGNESIUM: Magnesium: 2.4 mg/dL (ref 1.7–2.4)

## 2022-01-08 MED ORDER — INSULIN GLARGINE-YFGN 100 UNIT/ML ~~LOC~~ SOLN
15.0000 [IU] | Freq: Every day | SUBCUTANEOUS | Status: DC
  Administered 2022-01-08: 15 [IU] via SUBCUTANEOUS
  Filled 2022-01-08 (×2): qty 0.15

## 2022-01-08 NOTE — Progress Notes (Signed)
RT note. Patient currently on 9L [place] at this time, sat 94% with no labored breathing noted. BIPAP is in patients room on standby, RT will continue to monitor.

## 2022-01-08 NOTE — Progress Notes (Signed)
Patient ID: Elizabeth Melton, female   DOB: 1941-10-07, 81 y.o.   MRN: [id number]  PROGRESS NOTE    Elizabeth Melton  YNW:[id number] DOB: 03-06-1941 DOA: 12/28/2021 PCP: Kathyrn Lass, MD   Brief Narrative:  81 year old female with history of COPD, interstitial lung disease, chronic respiratory failure with hypoxia on 4 to 5 L oxygen via nasal cannula at home, severe pulmonary hypertension, chronic diastolic CHF, diabetes mellitus type 2, hypertension, paroxysmal A. fib, history of pulmonary embolism on Eliquis, PAD presented with worsening shortness of breath.  EMS found her with oxygen saturations of 85% on 5 L, she was placed on BiPAP and transferred to the ED.  She had missed few doses of diuretics at home.  In ED, chest x-ray showed chronic findings; labs were unremarkable.  Pulmonary and cardiology were consulted.  She was started on IV Lasix with modest response.  Subsequently, pulmonary has signed off.  She has already been switched to oral torsemide by heart failure team.  PT recommended SNF placement.  Assessment & Plan:   Acute on chronic hypoxic respiratory failure Acute on chronic diastolic heart failure Severe pulmonary artery hypertension -Recent right heart cath on 11/23/2021 showed normal filling pressures, severe PAH and with low cardiac output.  Plan was to add Tyvaso as an outpatient. -Echo in 11/202 showed EF of 60 to 65% with moderate TR, PASP of 85 mmHg -Missed few doses of diuretics at home -Heart failure team following.  Right heart catheterization today showed severe ongoing pulmonary hypertension.  Torsemide has been changed to 40 mg once daily.  Continue metoprolol succinate.  Strict input and output.  Daily weights.  Fluid restriction.  Negative balance of 8507 cc since presentation -Oxygen requirement worsening: Currently still on 10 L oxygen via high flow nasal cannula.  Normally wears 5 L oxygen at home.  Wean off as able.   -Pulmonary was reconsulted on 01/05/2022:  Patient was started on oral doxycycline and prednisone. -Overall prognosis is guarded to poor.  Palliative care following.  Currently remains full code.  Acute on low chronic back pain -Appears musculoskeletal; no history of injury or fall -Lumbar spine and sacrum/coccyx x-rays on 01/03/2022 showed no fractures; showed first-degree anterolisthesis at L4-L5 level along with disc space narrowing. -Continue Voltaren gel, Robaxin and lidocaine patch. -Continue physical therapy. -Will need SNF placement  AKI on CKD stage IIIa -creatinine improving to 1.32 today.  Diuretic plan as above.  Monitor  Hyponatremia -Resolved.  Monitor.  Paroxysmal A. Fib -Currently rate controlled.  Continue Toprol-XL and Eliquis  COPD Interstitial lung disease -Outpatient follow-up with pulmonary.  Considering antifibrotic therapy as an outpatient.  Tobacco abuse -Continue nicotine patch.  Essential hypertension -Blood pressure stable.  Continue Toprol-XL and diuretics  Diabetes mellitus type 2 with hypoglycemia -A1c 7.6 in 10/2021.  Decrease long-acting insulin to 15 units daily.  Continue with CBGs with SSI  Hypothyroidism -Continue levothyroxine  Hyperlipidemia/PAD -Continue pravastatin  Constipation -Continue bowel regimen   DVT prophylaxis: Eliquis Code Status: Full Family Communication: Niece at bedside on 01/05/2022 Disposition Plan: Status is: Inpatient  Remains inpatient appropriate because: Of need for SNF placement   Consultants: Heart failure team/pulmonary/palliative care  Procedures: Right heart catheterization on 01/06/2022  Antimicrobials: None   Subjective: Patient seen and examined at bedside.  Poor historian.  Denies chest pain, vomiting, abdominal pain.  Still complains of intermittent back pain.   Objective: Vitals:   01/08/22 0000 01/08/22 0100 01/08/22 0400 01/08/22 0402  BP:   (!) 144/48  Pulse: (!) 50 (!) 53 (!) 53   Resp: _0 Temp:   97.6 F (36.4  C)   TempSrc:   Oral   SpO2: 97% 98% 93%   Weight:    73.8 kg  Height:        Intake/Output Summary (Last 24 hours) at 01/08/2022 0726 Last data filed at 01/08/2022 0406 Gross per 24 hour  Intake 703 ml  Output 650 ml  Net 53 ml    Filed Weights   01/05/22 2331 01/07/22 0243 01/08/22 0402  Weight: 74.7 kg 73.8 kg 73.8 kg    Examination:  General exam: Appears chronically ill and deconditioned.  Still on 10 L oxygen by high flow nasal cannula.  No distress.   Respiratory system: Bilateral decreased breath sounds bases with scattered crackles  cardiovascular system: S1-S2 heard; bradycardic intermittently gastrointestinal system: Abdomen is mildly distended; soft and nontender.  Normal bowel sounds heard extremities: No cyanosis; trace lower extremity edema present Central nervous system: Awake; slow to respond.  Poor historian.  No focal neurological deficits.  Moving extremities  skin: No obvious petechiae/rashes  psychiatry: Currently not agitated.  Affect is mostly flat.  Data Reviewed: I have personally reviewed following labs and imaging studies  CBC: Recent Labs  Lab 01/02/22 0422 01/03/22 0401 01/05/22 0456 01/06/22 0751  WBC 8.9 8.4 8.2  --   HGB 15.6* 14.9 15.7* 17.0*   17.0*  HCT 48.6* 44.8 48.4* 50.0*   50.0*  MCV 102.7* 101.8* 103.0*  --   PLT 155 187 206  --     Basic Metabolic Panel: Recent Labs  Lab 01/04/22 0452 01/05/22 0456 01/06/22 0324 01/06/22 0751 01/07/22 0320 01/08/22 0233  NA 133* 134* 135 138   139 135 135  K 4.1 4.1 4.2 4.2   4.1 4.1 3.9  CL 97* 99 98  --  99 101  CO2 _1 --  25 24  GLUCOSE 103* 209* 111*  --  102* 60*  BUN 32* 47* 41*  --  40* 52*  CREATININE 1.38* 1.74* 1.40*  --  1.34* 1.32*  CALCIUM 8.7* 8.7* 8.9  --  9.1 8.8*  MG  --  2.3 2.6*  --  2.6* 2.4    GFR: Estimated Creatinine Clearance: 35.5 mL/min (A) (by C-G formula based on SCr of 1.32 mg/dL (H)). Liver Function Tests: No results for input(s): AST,  ALT, ALKPHOS, BILITOT, PROT, ALBUMIN in the last 168 hours.  No results for input(s): LIPASE, AMYLASE in the last 168 hours. No results for input(s): AMMONIA in the last 168 hours. Coagulation Profile: No results for input(s): INR, PROTIME in the last 168 hours. Cardiac Enzymes: No results for input(s): CKTOTAL, CKMB, CKMBINDEX, TROPONINI in the last 168 hours. BNP (last 3 results) No results for input(s): PROBNP in the last 8760 hours. HbA1C: No results for input(s): HGBA1C in the last 72 hours. CBG: Recent Labs  Lab 01/07/22 0612 01/07/22 1205 01/07/22 1530 01/07/22 2119 01/08/22 0559  GLUCAP 108* 256* 207* 73 89    Lipid Profile: No results for input(s): CHOL, HDL, LDLCALC, TRIG, CHOLHDL, LDLDIRECT in the last 72 hours. Thyroid Function Tests: No results for input(s): TSH, T4TOTAL, FREET4, T3FREE, THYROIDAB in the last 72 hours. Anemia Panel: No results for input(s): VITAMINB12, FOLATE, FERRITIN, TIBC, IRON, RETICCTPCT in the last 72 hours. Sepsis Labs: Recent Labs  Lab 01/05/22 0456 01/06/22 0324 01/07/22 0320  PROCALCITON <0.10 <0.10 <0.10     Recent Results (  from the past 240 hour(s))  Expectorated Sputum Assessment w Gram Stain, Rflx to Resp Cult     Status: None   Collection Time: 01/05/22 11:18 AM   Specimen: Expectorated Sputum  Result Value Ref Range Status   Specimen Description EXPECTORATED SPUTUM  Final   Special Requests NONE  Final   Sputum evaluation   Final    THIS SPECIMEN IS ACCEPTABLE FOR SPUTUM CULTURE Performed at [facility], [address], [place], [place] [address]    Report Status 01/05/2022 FINAL  Final  Culture, Respiratory w Gram Stain     Status: None   Collection Time: 01/05/22 11:18 AM  Result Value Ref Range Status   Specimen Description EXPECTORATED SPUTUM  Final   Special Requests NONE Reflexed from [id number]  Final   Gram Stain   Final    RARE SQUAMOUS EPITHELIAL CELLS PRESENT FEW WBC PRESENT,BOTH PMN AND  MONONUCLEAR MODERATE GRAM POSITIVE COCCI FEW GRAM NEGATIVE RODS RARE YEAST RARE GRAM POSITIVE RODS    Culture   Final    ABUNDANT Normal respiratory flora-no Staph aureus or Pseudomonas seen Performed at [facility], [place] [address], [place],  [address]    Report Status 01/07/2022 FINAL  Final          Radiology Studies: CARDIAC CATHETERIZATION  Result Date: 01/06/2022 1. Optimized filling pressures. 2. Severe pulmonary arterial hypertension        Scheduled Meds:  apixaban  5 mg Oral BID   arformoterol  15 mcg Nebulization BID   And   umeclidinium bromide  1 puff Inhalation Daily   budesonide (PULMICORT) nebulizer solution  0.25 mg Nebulization BID   buPROPion  150 mg Oral Daily   diclofenac Sodium  2 g Topical TID   doxycycline  100 mg Oral Q12H   DULoxetine  30 mg Oral Daily   guaiFENesin  600 mg Oral BID   insulin aspart  0-15 Units Subcutaneous TID WC   insulin glargine-yfgn  20 Units Subcutaneous Daily   ipratropium-albuterol  3 mL Nebulization BID   levothyroxine  75 mcg Oral QAC breakfast   lidocaine  2 patch Transdermal QHS   metoprolol succinate  12.5 mg Oral Daily   nicotine  14 mg Transdermal Daily   polyethylene glycol  17 g Oral BID   pravastatin  40 mg Oral q1800   predniSONE  20 mg Oral Q breakfast   senna-docusate  2 tablet Oral BID   sodium chloride flush  3 mL Intravenous Q12H   sodium chloride flush  3 mL Intravenous Q12H   torsemide  40 mg Oral Daily   Continuous Infusions:  sodium chloride     sodium chloride            Aline August, MD [facility] 01/08/2022, 7:26 AM

## 2022-01-09 ENCOUNTER — Inpatient Hospital Stay (HOSPITAL_COMMUNITY): Payer: No Typology Code available for payment source

## 2022-01-09 DIAGNOSIS — I5033 Acute on chronic diastolic (congestive) heart failure: Secondary | ICD-10-CM | POA: Diagnosis not present

## 2022-01-09 DIAGNOSIS — J9601 Acute respiratory failure with hypoxia: Secondary | ICD-10-CM

## 2022-01-09 LAB — BASIC METABOLIC PANEL
Anion gap: 10 (ref 5–15)
BUN: 47 mg/dL — ABNORMAL HIGH (ref 8–23)
CO2: 26 mmol/L (ref 22–32)
Calcium: 9.1 mg/dL (ref 8.9–10.3)
Chloride: 99 mmol/L (ref 98–111)
Creatinine, Ser: 1.21 mg/dL — ABNORMAL HIGH (ref 0.44–1.00)
GFR, Estimated: 46 mL/min — ABNORMAL LOW (ref >60)
Glucose, Bld: 101 mg/dL — ABNORMAL HIGH (ref 70–99)
Potassium: 3.6 mmol/L (ref 3.5–5.1)
Sodium: 135 mmol/L (ref 135–145)

## 2022-01-09 LAB — GLUCOSE, CAPILLARY
Glucose-Capillary: 100 mg/dL — ABNORMAL HIGH (ref 70–99)
Glucose-Capillary: 118 mg/dL — ABNORMAL HIGH (ref 70–99)
Glucose-Capillary: 188 mg/dL — ABNORMAL HIGH (ref 70–99)
Glucose-Capillary: 110 mg/dL — ABNORMAL HIGH (ref 70–99)
Glucose-Capillary: 113 mg/dL — ABNORMAL HIGH (ref 70–99)

## 2022-01-09 LAB — MAGNESIUM: Magnesium: 2.5 mg/dL — ABNORMAL HIGH (ref 1.7–2.4)

## 2022-01-09 MED ORDER — INSULIN GLARGINE-YFGN 100 UNIT/ML ~~LOC~~ SOLN
10.0000 [IU] | Freq: Every day | SUBCUTANEOUS | Status: DC
  Administered 2022-01-09 – 2022-01-13 (×5): 10 [IU] via SUBCUTANEOUS
  Filled 2022-01-09 (×5): qty 0.1

## 2022-01-09 MED FILL — Heparin Sod (Porcine)-NaCl IV Soln 1000 Unit/500ML-0.9%: INTRAVENOUS | Qty: 500 | Status: AC

## 2022-01-09 NOTE — Care Management Important Message (Signed)
Important Message  Patient Details  Name: ADDISEN CHAPPELLE MRN: [id number] Date of Birth: 1941-02-15   Medicare Important Message Given:  Yes     [name] 01/09/2022, 8:00 AM

## 2022-01-09 NOTE — Progress Notes (Signed)
NAME:  Elizabeth Melton, MRN:  [id number], DOB:  11-12-1941, LOS: 1 ADMISSION DATE:  12/28/2021, CONSULTATION DATE:  1/19 REFERRING MD:  Dr. Broadus John, CHIEF COMPLAINT:  Acute respiratory failure w/ hypoxia   History of Present Illness:  Patient is a 81 year-old female with pertinent PMH chronic diastolic CHF (patient of Dr. Aundra Dubin), severe pulm hypertension, chronic respiratory failure, COPD, ILD (patient of Dr. [name]) on 5L O2 presents to [facility] on 1/18 with respiratory distress.  On 1/18 admitted to [facility] ED.  Sats initially 85% on 5L [place].  Her sats are typically 88 to 89%.  Patient placed on BiPAP.  States she missed some of her water pills.  Denies fever.  BNP 565.  CXR showing chronic lung injury.  Lasix given.  Albuterol neb given.  Initially no signs of eczema/ILD flare.  Admitted to [facility] on [facility] floors.   On 1/19 patient continuing to have desaturation episodes and requiring BiPAP more often.  Patient now on [place]ter HFNC 8L with sats 90%.  Questionable ILD is cause of respiratory distress.  PCCM asked for pulm consult.  Continuing diuresis with slight upward trend to his BUN/creatinine   Pertinent  Medical History        Past Medical History:  Diagnosis Date   Acute on chronic diastolic (congestive) heart failure ([place]) 05/19/2021   Acute respiratory failure with hypoxia (HCC) 09/06/2020   Atrial fibrillation with RVR (HCC)     Diabetes mellitus without complication (HCC)     Hyperlipidemia     Hypertension     Hypothyroidism     IPF (idiopathic pulmonary fibrosis) ([place])     Pneumonia due to COVID-19 virus 01/16/2021   Thrombocytopenia ([place])          Significant Hospital Events: Including procedures, antibiotic start and stop dates in addition to other pertinent events   1/18: admitted to [facility] 1/19: PCCM consulted for inability to wean from BiPAP 1/20: on salter HFNC 8L 1/26 increased to 9 L   Interim History / Subjective:  Walked this morning, required 10L O2 to maintain 90-04%  while walking. She feels well, mostly attributable to having a BM.  Objective    Vitals:   01/09/22 0901 01/09/22 1116  BP:  (!) 134/56  Pulse:  60  Resp:  20  Temp:  97.6 F (36.4 C)  SpO2: 95% 94%           Filed Weights    12/28/21 2006 12/29/21 0513 12/30/21 0330  Weight: 75.5 kg 75.1 kg 75.7 kg      Examination: General: chronically ill appearing woman sitting up in the chair in AND HEENT: [place]/AT, eyes anicteric Neuro:  awake, alert, moving all extremities CV: S1S2, RRR PULM: rhales in bases, CTA anteriorly GI: soft, NT, ND MSK: minimal LE edema   I/O -610cc Net -9L for admission Weight decreased 1.8kg since admission  CXR personally reviewed> persistent fibrosis in the bases, improving upper lobe opacities  Last CT scan of her chest was 07/13/2021- 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 Oxygen requirements remain high, but improving as CXR improves. Suspect part of this process has been acute pulmonary edema. COPD with AE -titrate down O2 as able to maintain SpO2> 90% -ambulate;  anticipate she may need to go home on more O2 for walking than at rest -will need repeat O2 assessment prior to discharge once she plateaus her oxygen requirements -con't diuresis -con't prednisone -Con't bronchodilators. Can discharge on triple inhaled therapy. -con't antibiotics   Acute on chronic diastolic congestive heart failure with biventricular failure -con't torsemide -appreciate AHF's management -strict I/Os   Severe pulmonary hypertension; WHO group III. Mean PAP 42 with PCWP 9.  PVR 9.7 WU. CO 3.38, CI 1.83. 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 [name] as outpatient-has appointment scheduled for 2/10 --Per office visit 1/5 with MR, antifibrotic's were being considered in the future. Would defer this to an outpatient decision; there is no benefit in the acute setting  -Agree that this is not convincing for IPF flare given fluctuating oxygen requirements.  Summary: Overall prognosis is guarded in the setting of PH -IPF , although advanced therapies such as Tyvaso and antifibrotic's are being considered as outpatient. I would continue palliative conversation concurrently.  Son updated at bedside with patient during rounds.   Julian Hy, DO 01/09/22 3:37 PM [facility]

## 2022-01-09 NOTE — Progress Notes (Signed)
BiPAP at bedside if needed. No distress noted at this time, pt on nasal cannula vital signs WNR. Bipap not indicated at this time

## 2022-01-09 NOTE — TOC Progression Note (Addendum)
Transition of Care [facility]) - Progression Note    Patient Details  Name: RAELIN PIXLER MRN: [id number] Date of Birth: 1941-09-24  Transition of Care [facility]) CM/SW Contact  Ardean Melroy, LCSW Phone Number: 01/09/2022, 9:51 AM  Clinical Narrative:    Patient's daughter [name] confirmed that Ms. Tarazon's [employer] coverage plan will come into effect on 01/11/22 and she will bring the insurance card this evening. [name] provided information regarding proof of coverage the [place] [id number] Medicare approved the enrollment beginning 2/1//23. Ms. Plate is still on HFNC 9L and will need to be weaned before she is able to discharge to the SNF.  CSW will continue to follow throughout discharge.   Expected Discharge Plan: [place] Barriers to Discharge: Continued Medical Work up  Expected Discharge Plan and Services Expected Discharge Plan: [place] In-house Referral: Clinical Social Work Discharge Planning Services: CM Consult Post Acute Care Choice: [place] arrangements for the past 2 months: Apartment Expected Discharge Date: 01/06/22                 DME Agency: [employer] Date DME Agency Contacted: 12/30/21 Time DME Agency Contacted: 1600 Representative spoke with at DME Agency: [place]) Interventions    Readmission Risk Interventions Readmission Risk Prevention Plan 09/10/2020  Post Dischage Appt Complete  Medication Screening Complete  Transportation Screening Complete  Some recent data might be hidden   Addie Cederberg, MSW, LCSWA [phone] Heart Failure Social Worker

## 2022-01-09 NOTE — Progress Notes (Signed)
Daily Progress Note   Patient Name: Elizabeth Melton       Date: 01/09/2022 DOB: 02-Feb-1941  Age: 81 y.o. MRN#: [id number] Attending Physician: Aline August, MD Primary Care Physician: Kathyrn Lass, MD Admit Date: 12/28/2021   HPI/Patient Profile: 81 year old female with PMH significant for chronic diastolic CHF (followed by Dr. Aundra Dubin), severe pulmonary hypertension, ILD (followed by Dr. [name]) with chronic respiratory failure on 5L oxygen at home. She presented to the emergency department on 12/28/21 with respiratory distress. Admitted to [facility] with acute on chronic diastolic CHF with RV failure.     Subjective: 14:50 - I spoke with daughter/Elizabeth Melton by phone. Elizabeth Melton states that her mother conveyed to her that her back pain is improved and she has been sleeping better since starting the "new medication" (duloxetine). Elizabeth Melton also reports that she had her mother discussed/filled out the MOST form, and that she left it in the room to be completed.   18:30 - I went to see patient at bedside. She is OOB to the recliner eating dinner. She is currently on 8L oxygen. She reports feeling "great" and is eager for discharge to SNF, with ultimate goal of returning home. She confirms that her back pain seems improved.   Advanced directives, concepts specific to code status, artifical feeding and hydration, and rehospitalization were considered and discussed. The patient and family have outlined their wishes for the following treatment decisions:  Cardiopulmonary Resuscitation: Attempt Resuscitation (CPR)  Medical Interventions: Full Scope of Treatment: Use intubation, advanced airway interventions, mechanical ventilation, cardioversion as indicated, medical treatment, IV fluids, etc, also provide comfort  measures. Transfer to the hospital if indicated  Antibiotics: Antibiotics if indicated  IV Fluids: IV fluids if indicated  Feeding Tube: Feeding tube long-term if indicated      Objective:   Physical Exam Vitals reviewed.  Constitutional:      General: She is not in acute distress.    Comments: Chronically ill-appearing  Cardiovascular:     Rate and Rhythm: Normal rate.  Pulmonary:     Effort: Pulmonary effort is normal.  Neurological:     Mental Status: She is alert and oriented to person, place, and time.            Vital Signs: BP (!) 134/56 (BP Location: Right Arm)    Pulse  60    Temp 97.6 F (36.4 C) (Oral)    Resp 20    Ht _0  (1.676 m)    Wt 73.4 kg    SpO2 (!) 87%    BMI 26.13 kg/m  SpO2: SpO2: (!) 87 % O2 Device: O2 Device: Room Air (Patient took 02 off to adjust) O2 Flow Rate: O2 Flow Rate (L/min): 9 L/min   LBM: Last BM Date: 01/08/22 Baseline Weight: Weight: 75.7 kg Most recent weight: Weight: 73.4 kg       Palliative Assessment/Data: PPS 40%       Palliative Care Assessment & Plan   Assessment: - acute hypoxic respiratory failure - acute on chronic diastolic CHF with biventricular failure - underlying pulmonary fibrosis/ILD - possible bronchitis - severe pulmonary hypertension - COPD with exacerbation  Recommendations/Plan: Full code full scope MOST form completed - copy made to scan into [facility] and original placed on shadow chart Continue duloxetine 30 mg daily for mood and (likely) chronic back pain Continue outpatient palliative at discharge - patient is already enrolled with [facility]  Prognosis:  Unable to determine  Discharge Planning: [place] for rehab with Palliative care service follow-up    Thank you for allowing the Palliative Medicine Team to assist in the care of this patient.  Total time: 36 minutes    Greater than 50%  of this time was spent counseling and coordinating care related to the above  assessment and plan.  Lavena Bullion, NP  Please contact Palliative Medicine Team phone at [phone] for questions and concerns.

## 2022-01-09 NOTE — Progress Notes (Addendum)
Patient ID: Elizabeth Melton, female   DOB: [birth year], 81 y.o.   MRN: [id number]     Cardiology Rounding Note  PCP-Cardiologist: Elizabeth Heinz, MD   Subjective:     1/27: RHC demonstrated optimized filling pressures and severe PAH. Started back on steroids (prednisone) and doxycycline, per pulmonology.   Continues on torsemide 40 mg daily. Wt stable since RHC.  Scr stable, 1.21   O2 still 8L HFNC but she notes subjective improvement in breathing.   RHC Procedural Findings: Hemodynamics (mmHg) RA 5 RV 72/7 PA 68/27, mean 42 PCWP mean 9  Oxygen saturations: PA 62% AO 96%  Cardiac Output (Fick) 3.38  Cardiac Index (Fick) 1.83 PVR 9.7 WU   Objective:   Weight Range: 73.4 kg Body mass index is 26.13 kg/m.   Vital Signs:   Temp:  [97.5 F (36.4 C)-98 F (36.7 C)] 97.5 F (36.4 C) (01/30 0734) Pulse Rate:  [55-60] 60 (01/30 0734) Resp:  [13-19] 19 (01/30 0734) BP: (111-141)/(60-66) 131/65 (01/30 0734) SpO2:  [90 %-95 %] 95 % (01/30 0734) Weight:  [73.4 kg] 73.4 kg (01/30 0510) Last BM Date: 01/08/22  Weight change: Filed Weights   01/07/22 0243 01/08/22 0402 01/09/22 0510  Weight: 73.8 kg 73.8 kg 73.4 kg    Intake/Output:   Intake/Output Summary (Last 24 hours) at 01/09/2022 0755 Last data filed at 01/09/2022 0515 Gross per 24 hour  Intake 240 ml  Output 850 ml  Net -610 ml      Physical Exam   General:  Well appearing. On HFNC but no increased WOB  HEENT: normal Neck: supple. JVD 6-7 cm Carotids 2+ bilat; no bruits. No lymphadenopathy or thyromegaly appreciated. Cor: PMI nondisplaced. Regular rate & rhythm. No rubs, gallops or murmurs. Lungs: b/l inspiratory rhonchi at bases  Abdomen: soft, nontender, nondistended. No hepatosplenomegaly. No bruits or masses. Good bowel sounds. Extremities: no cyanosis, clubbing, rash, edema Neuro: alert & oriented x 3, cranial nerves grossly intact. moves all 4 extremities w/o difficulty. Affect  pleasant.   Telemetry   NSR HR upper 60s, personally reviewed   Labs    CBC No results for input(s): WBC, NEUTROABS, HGB, HCT, MCV, PLT in the last 72 hours.  Basic Metabolic Panel Recent Labs    01/08/22 0233 01/09/22 0451  NA 135 135  K 3.9 3.6  CL 101 99  CO2 24 26  GLUCOSE 60* 101*  BUN 52* 47*  CREATININE 1.32* 1.21*  CALCIUM 8.8* 9.1  MG 2.4 2.5*   Liver Function Tests No results for input(s): AST, ALT, ALKPHOS, BILITOT, PROT, ALBUMIN in the last 72 hours.  No results for input(s): LIPASE, AMYLASE in the last 72 hours. Cardiac Enzymes No results for input(s): CKTOTAL, CKMB, CKMBINDEX, TROPONINI in the last 72 hours.  BNP: BNP (last 3 results) Recent Labs    11/18/21 1610 12/28/21 1218 12/29/21 0331  BNP 241.9* 565.2* 821.0*    ProBNP (last 3 results) No results for input(s): PROBNP in the last 8760 hours.   D-Dimer No results for input(s): DDIMER in the last 72 hours. Hemoglobin A1C No results for input(s): HGBA1C in the last 72 hours. Fasting Lipid Panel No results for input(s): CHOL, HDL, LDLCALC, TRIG, CHOLHDL, LDLDIRECT in the last 72 hours. Thyroid Function Tests No results for input(s): TSH, T4TOTAL, T3FREE, THYROIDAB in the last 72 hours.  Invalid input(s): FREET3  Other results:   Imaging    No results found.   Medications:     Scheduled Medications:  apixaban  5 mg Oral BID   arformoterol  15 mcg Nebulization BID   And   umeclidinium bromide  1 puff Inhalation Daily   budesonide (PULMICORT) nebulizer solution  0.25 mg Nebulization BID   buPROPion  150 mg Oral Daily   diclofenac Sodium  2 g Topical TID   doxycycline  100 mg Oral Q12H   DULoxetine  30 mg Oral Daily   guaiFENesin  600 mg Oral BID   insulin aspart  0-15 Units Subcutaneous TID WC   insulin glargine-yfgn  10 Units Subcutaneous Daily   ipratropium-albuterol  3 mL Nebulization BID   levothyroxine  75 mcg Oral QAC breakfast   lidocaine  2 patch Transdermal  QHS   metoprolol succinate  12.5 mg Oral Daily   nicotine  14 mg Transdermal Daily   polyethylene glycol  17 g Oral BID   pravastatin  40 mg Oral q1800   predniSONE  20 mg Oral Q breakfast   senna-docusate  2 tablet Oral BID   sodium chloride flush  3 mL Intravenous Q12H   sodium chloride flush  3 mL Intravenous Q12H   torsemide  40 mg Oral Daily    Infusions:  sodium chloride     sodium chloride      PRN Medications: sodium chloride, sodium chloride, acetaminophen **OR** acetaminophen, acetaminophen, albuterol, alum & mag hydroxide-simeth, bisacodyl, guaiFENesin-dextromethorphan, methocarbamol, ondansetron (ZOFRAN) IV, sodium chloride flush, sodium chloride flush, traMADol    Patient Profile   Mr Elizabeth Melton is a 81 year old with a history of PE, PAF, ILD, COPD, and diastolic CHF with RV failure, and HFpEF.    Admitted with A/C respiratory failure + A/C HFpEF  Assessment/Plan   1. A/C Hypoxic Respiratory Failure: On 5 liters at home. Placed on Bipap in the ED and had weaned back to 5 liters. Currently on HFNC 9L. Multifactorial hypoxemia: ILD, PAH and A/c CHF.  She does not appear significantly volume overloaded on exam and RHC today showed optimized filling pressures with ongoing moderate PAH.  No fever, WBCs have not been elevated and PCT has been normal (<0.1 today). She has been restarted on prednisone and doxycycline by pulmonary. CXR 1/26 suggest that there was an acute process?  Edema versus pneumonia which is improved but oxygen requirements remain high, 8L HFNC. [place] 1/27 demonstrated optimized filling pressures and severe PAH. - c/w nebs/ bronchodilators  - Prednisone per pulmonary.  - doxycyline per pulmonary - Continue torsemide 40 mg daily  2. A/C HFpEF---> RV Failure: Echo in 11/22 showed EF 60-65%, mildly decreased RV systolic function with mild RV enlargement, mild MR, moderate TR, PASP 85 mmHg, mild aortic stenosis and mild aortic insufficiency, IVC dilated. The RV  failure may be primarily due to pulmonary hypertension. RHC in 12/22 showed normal filling pressures with severe PH.  She was diuresed here, not volume overloaded by exam and repeated RHC which showed normal filling pressures, moderate PAH.  - Torsemide 40 mg daily for now.  3. PAH: Pulmonary venous hypertension on 7/22 RHC.  However, last echo in 11/22 showed PASP up to 85 mmHg. No evidence for chronic PE on 9/22 V/Q scan.  RHC in 12/22 showed normal filling pressures with severe pulmonary arterial hypertension.  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   [place] 1/27 showed ongoing moderate PAH. - With PH-ILD, a trial of Tyvaso-DPI planned. Tyvaso application pending.  4. PAF: Maintaining SR.  -On Eliquis 5 mg twice  a day.  5. ILD: Followed by Dr. [name].  Seen by pulmonary here. Treating w/ Prednisone 20 mg daily. Pulmonary to see again today.  6. DMII -On SSI 7. AKI on CKD Stage IIIa: Creatinine up to 1.74, ?overdiuresis. Now back to 1.2.  - torsemide 40 mg daily.  8.   Hypothyroidism - On Levothyroxine.  9.Tobacco Abuse: Last smoked 2 weeks ago.  10. Back pain: Her main complaint. Per [facility].    Disposition: SNF when stable.   Length of Stay: [address], [place]  01/09/2022 7:55 AM  Agree with PA note, she is stable from cardiac perspective.  Can continue current torsemide 40 mg daily with stable weight and creatinine. Working on [employer] for PH-ILD in the outpatient setting.  Eventual SNF when cleared by pulmonary and [facility].   Loralie Champagne 01/09/2022

## 2022-01-09 NOTE — Progress Notes (Signed)
Physical Therapy Treatment Patient Details Name: Elizabeth Melton MRN: [id number] DOB: 05/27/1941 Today's Date: 01/09/2022   History of Present Illness 81 y.o. female was admitted 1/18 after recent admits with SOB and was hypoxic on home O2 of 5L.  Has new CPAP orders.  Pt is in CHF with diuresing and using purwick with significant low baseline sats 87-89%.  PMHx:  CHF, DM, HTN, HLD, hypothyroidism, PE, on ELiquis, pulm fibrosis on home O2, smoker, CKD, PAF, PAD, ILD, pulm HTN,    PT Comments    The pt remains highly motivated to complete session this afternoon and was able to progress distance while using only 8L O2 at this time. She did require seated rest after 150 ft (SpO2 90-93%), and then completed LE exercises using 8" stairs prior to 2nd seated rest and return to her room. She does demo improvement in self-selected gait speed from prior session and was able to complete x2 speed intervals (10-15 ft of quick walking) with good stability and VSS. The pt continues to benefit from cues for PLB and reports she is limited in mobility by back pain. The pt was given HEP for LE exercises to continue to progress endurance outside of PT sessions.   Gait Speed: 0.55ms using rollator and with 8L O2. (Gait speed <0.620m indicates increased risk of falls and dependence in ADLs)    Recommendations for follow up therapy are one component of a multi-disciplinary discharge planning process, led by the attending physician.  Recommendations may be updated based on patient status, additional functional criteria and insurance authorization.  Follow Up Recommendations  Skilled nursing-short term rehab (<3 hours/day)     Assistance Recommended at Discharge Frequent or constant Supervision/Assistance  Patient can return home with the following A little help with walking and/or transfers;A little help with bathing/dressing/bathroom;Assistance with cooking/housework;Assist for transportation;Help with stairs or  ramp for entrance   Equipment Recommendations  None recommended by PT    Recommendations for Other Services       Precautions / Restrictions Precautions Precautions: Fall Precaution Comments: pt on 8L with mobility Restrictions Weight Bearing Restrictions: No     Mobility  Bed Mobility Overal bed mobility: Needs Assistance             General bed mobility comments: pt OOB on arrival of PT and returned to recliner at end of session    Transfers Overall transfer level: Needs assistance Equipment used: Rollator (4 wheels) Transfers: Sit to/from Stand Sit to Stand: Min guard           General transfer comment: minG to power up with cues for hand placement and use of breaks on rollator. increased time to power up    Ambulation/Gait Ambulation/Gait assistance: Min guard Gait Distance (Feet): 125 Feet (+ 125 ft after stairs and seated rest) Assistive device: Rollator (4 wheels) Gait Pattern/deviations: Step-through pattern Gait velocity: 0.45 m/s Gait velocity interpretation: 1.31 - 2.62 ft/sec, indicative of limited community ambulator   General Gait Details: wide BOS with slight trunk flexion. Pt cued for PLB with gait, maintained SpO2 88-94% on 8L when good pleth. seated rest for 4 min after 150 ft prior to continuing ambulation. able to complete speed intervals at end (10-15 ft) with good increase in speed   Stairs Stairs: Yes Stairs assistance: Min guard Stair Management: One rail Right, Step to pattern Number of Stairs: 5 (each leg) General stair comments: x5 step ups with RLE then x5 with LLE     Balance Overall  balance assessment: Needs assistance Sitting-balance support: Feet supported Sitting balance-Leahy Scale: Good Sitting balance - Comments: able to reach and lean outside BOS   Standing balance support: Bilateral upper extremity supported, During functional activity Standing balance-Leahy Scale: Poor Standing balance comment: able to static  stand with single UE support                            Cognition Arousal/Alertness: Awake/alert Behavior During Therapy: WFL for tasks assessed/performed Overall Cognitive Status: Within Functional Limits for tasks assessed                                 General Comments: pt able to answer all questions and follow all cues during session        Exercises Other Exercises Other Exercises: seated trunk rotation x 10 each direction Other Exercises: seated trunk flexion/extension with arms reaching x 10    General Comments General comments (skin integrity, edema, etc.): VSS on 8L O2      Pertinent Vitals/Pain Pain Assessment Pain Assessment: Faces Faces Pain Scale: Hurts even more Pain Location: back Pain Descriptors / Indicators: Aching, Grimacing, Guarding Pain Intervention(s): Limited activity within patient's tolerance, Monitored during session, Repositioned     PT Goals (current goals can now be found in the care plan section) Acute Rehab PT Goals Patient Stated Goal: to get home and maybe have help with EMT's for stairs to enter house PT Goal Formulation: With patient/family Time For Goal Achievement: 01/16/22 Potential to Achieve Goals: Good Progress towards PT goals: Progressing toward goals    Frequency    Min 3X/week      PT Plan Current plan remains appropriate       AM-PAC PT "6 Clicks" Mobility   Outcome Measure  Help needed turning from your back to your side while in a flat bed without using bedrails?: A Little Help needed moving from lying on your back to sitting on the side of a flat bed without using bedrails?: A Little Help needed moving to and from a bed to a chair (including a wheelchair)?: A Little Help needed standing up from a chair using your arms (e.g., wheelchair or bedside chair)?: A Little Help needed to walk in hospital room?: A Little Help needed climbing 3-5 steps with a railing? : A Little 6 Click  Score: 18    End of Session Equipment Utilized During Treatment: Oxygen Activity Tolerance: Patient limited by pain;Other (comment) Patient left: with call bell/phone within reach;in chair Nurse Communication: Mobility status PT Visit Diagnosis: Unsteadiness on feet (R26.81);Muscle weakness (generalized) (M62.81);Difficulty in walking, not elsewhere classified (R26.2)     Time: 9996-7227 PT Time Calculation (min) (ACUTE ONLY): 27 min  Charges:  $Therapeutic Exercise: 23-37 mins                     West Carbo, PT, DPT   Acute Rehabilitation Department Pager #: [phone]   Sandra Cockayne 01/09/2022, 5:09 PM

## 2022-01-09 NOTE — Progress Notes (Addendum)
Patient ID: Elizabeth Melton, female   DOB: [birth year], 81 y.o.   MRN: [id number]  PROGRESS NOTE    PERRY MOLLA  [id number]:[id number] DOB: 1941/12/05 DOA: 12/28/2021 PCP: Kathyrn Lass, MD   Brief Narrative:  81 year old female with history of COPD, interstitial lung disease, chronic respiratory failure with hypoxia on 4 to 5 L oxygen via nasal cannula at home, severe pulmonary hypertension, chronic diastolic CHF, diabetes mellitus type 2, hypertension, paroxysmal A. fib, history of pulmonary embolism on Eliquis, PAD presented with worsening shortness of breath.  EMS found her with oxygen saturations of 85% on 5 L, she was placed on BiPAP and transferred to the ED.  She had missed few doses of diuretics at home.  In ED, chest x-ray showed chronic findings; labs were unremarkable.  Pulmonary and cardiology were consulted.  She was started on IV Lasix with modest response.  Subsequently, pulmonary has signed off.  She has already been switched to oral torsemide by heart failure team.  PT recommended SNF placement.  Assessment & Plan:   Acute on chronic hypoxic respiratory failure Acute on chronic diastolic heart failure Severe pulmonary artery hypertension -Recent right heart cath on 11/23/2021 showed normal filling pressures, severe PAH and with low cardiac output.  Plan was to add Tyvaso as an outpatient. -Echo in 11/202 showed EF of 60 to 65% with moderate TR, PASP of 85 mmHg -Missed few doses of diuretics at home -Heart failure team following.  Right heart catheterization today showed severe ongoing pulmonary hypertension.  Torsemide has been changed to 40 mg once daily.  Continue metoprolol succinate.  Strict input and output.  Daily weights.  Fluid restriction.  Negative balance of 8507 cc since presentation -Oxygen requirement worsening: Currently still on 8 L oxygen via high flow nasal cannula.  Normally wears 5 L oxygen at home.  Wean off as able.   -Pulmonary was reconsulted on 01/05/2022:  Patient was started on oral doxycycline and prednisone. -Overall prognosis is guarded to poor.  Palliative care following.  Currently remains full code.  Acute on low chronic back pain -Appears musculoskeletal; no history of injury or fall -Lumbar spine and sacrum/coccyx x-rays on 01/03/2022 showed no fractures; showed first-degree anterolisthesis at L4-L5 level along with disc space narrowing. -Continue Voltaren gel, Robaxin and lidocaine patch. -Continue physical therapy. -Will need SNF placement  AKI on CKD stage IIIa -creatinine improving to 1.21 today.  Diuretic plan as above.  Monitor  Hyponatremia -Resolved.  Monitor.  Paroxysmal A. Fib -Currently rate controlled.  Continue Toprol-XL and Eliquis  COPD Interstitial lung disease -Outpatient follow-up with pulmonary.  Considering antifibrotic therapy as an outpatient.  Tobacco abuse -Continue nicotine patch.  Essential hypertension -Blood pressure stable.  Continue Toprol-XL and diuretics  Diabetes mellitus type 2 with hypoglycemia -A1c 7.6 in 10/2021.  Decrease long-acting insulin to 10 units daily.  Continue with CBGs with SSI  Hypothyroidism -Continue levothyroxine  Hyperlipidemia/PAD -Continue pravastatin  Constipation -Continue bowel regimen   DVT prophylaxis: Eliquis Code Status: Full Family Communication: Niece at bedside on 01/05/2022 Disposition Plan: Status is: Inpatient  Remains inpatient appropriate because: Of need for SNF placement   Consultants: Heart failure team/pulmonary/palliative care  Procedures: Right heart catheterization on 01/06/2022  Antimicrobials: None   Subjective: Patient seen and examined at bedside.  Poor historian.  No fever, vomiting, agitation, chest pain reported.  Complains of intermittent back pain.   Objective: Vitals:   01/08/22 1140 01/08/22 1632 01/08/22 2358 01/09/22 0510  BP: (!) 141/60 124/64 133/65  111/65  Pulse: (!) 55 (!) 58  60  Resp: _0 Temp: 97.9 F (36.6 C) 97.8 F (36.6 C) 97.6 F (36.4 C) 98 F (36.7 C)  TempSrc: Oral Oral Oral Oral  SpO2: 93% 93% 93% 90%  Weight:    73.4 kg  Height:        Intake/Output Summary (Last 24 hours) at 01/09/2022 0715 Last data filed at 01/09/2022 0515 Gross per 24 hour  Intake 240 ml  Output 850 ml  Net -610 ml    Filed Weights   01/07/22 0243 01/08/22 0402 01/09/22 0510  Weight: 73.8 kg 73.8 kg 73.4 kg    Examination:  General exam: Appears chronically ill and deconditioned.  No acute distress.  On 8 L oxygen via high flow nasal cannula  respiratory system: Decreased breath sounds at bases bilaterally with some crackles  cardiovascular system: Intermittent bradycardia present; S1-S2 heard  gastrointestinal system: Abdomen is distended slightly; soft and nontender.  Bowel sounds are heard  extremities: Mild lower extremity edema present; no clubbing  Central nervous system: Still slow to respond; alert.  Poor historian.  No focal neurological deficits.  Moves extremities  skin: No obvious ecchymosis/lesions  psychiatry: Flat affect.  No signs of agitation currently.  Data Reviewed: I have personally reviewed following labs and imaging studies  CBC: Recent Labs  Lab 01/03/22 0401 01/05/22 0456 01/06/22 0751  WBC 8.4 8.2  --   HGB 14.9 15.7* 17.0*   17.0*  HCT 44.8 48.4* 50.0*   50.0*  MCV 101.8* 103.0*  --   PLT 187 206  --     Basic Metabolic Panel: Recent Labs  Lab 01/05/22 0456 01/06/22 0324 01/06/22 0751 01/07/22 0320 01/08/22 0233 01/09/22 0451  NA 134* 135 138   139 135 135 135  K 4.1 4.2 4.2   4.1 4.1 3.9 3.6  CL 99 98  --  99 101 99  CO2 25 28  --  _1 GLUCOSE 209* 111*  --  102* 60* 101*  BUN 47* 41*  --  40* 52* 47*  CREATININE 1.74* 1.40*  --  1.34* 1.32* 1.21*  CALCIUM 8.7* 8.9  --  9.1 8.8* 9.1  MG 2.3 2.6*  --  2.6* 2.4 2.5*    GFR: Estimated Creatinine Clearance: 38.6 mL/min (A) (by C-G formula based on SCr of 1.21 mg/dL  (H)). Liver Function Tests: No results for input(s): AST, ALT, ALKPHOS, BILITOT, PROT, ALBUMIN in the last 168 hours.  No results for input(s): LIPASE, AMYLASE in the last 168 hours. No results for input(s): AMMONIA in the last 168 hours. Coagulation Profile: No results for input(s): INR, PROTIME in the last 168 hours. Cardiac Enzymes: No results for input(s): CKTOTAL, CKMB, CKMBINDEX, TROPONINI in the last 168 hours. BNP (last 3 results) No results for input(s): PROBNP in the last 8760 hours. HbA1C: No results for input(s): HGBA1C in the last 72 hours. CBG: Recent Labs  Lab 01/08/22 0559 01/08/22 1138 01/08/22 1644 01/08/22 2116 01/09/22 0604  GLUCAP 89 169* 95 126* 100*    Lipid Profile: No results for input(s): CHOL, HDL, LDLCALC, TRIG, CHOLHDL, LDLDIRECT in the last 72 hours. Thyroid Function Tests: No results for input(s): TSH, T4TOTAL, FREET4, T3FREE, THYROIDAB in the last 72 hours. Anemia Panel: No results for input(s): VITAMINB12, FOLATE, FERRITIN, TIBC, IRON, RETICCTPCT in the last 72 hours. Sepsis Labs: Recent Labs  Lab 01/05/22 0456 01/06/22 0324 01/07/22 0320  PROCALCITON <0.10 <0.10 <0.10  Recent Results (from the past 240 hour(s))  Expectorated Sputum Assessment w Gram Stain, Rflx to Resp Cult     Status: None   Collection Time: 01/05/22 11:18 AM   Specimen: Expectorated Sputum  Result Value Ref Range Status   Specimen Description EXPECTORATED SPUTUM  Final   Special Requests NONE  Final   Sputum evaluation   Final    THIS SPECIMEN IS ACCEPTABLE FOR SPUTUM CULTURE Performed at [facility], [address] [place], [place] [address]    Report Status 01/05/2022 FINAL  Final  Culture, Respiratory w Gram Stain     Status: None   Collection Time: 01/05/22 11:18 AM  Result Value Ref Range Status   Specimen Description EXPECTORATED SPUTUM  Final   Special Requests NONE Reflexed from [id number]  Final   Gram Stain   Final    RARE SQUAMOUS  EPITHELIAL CELLS PRESENT FEW WBC PRESENT,BOTH PMN AND MONONUCLEAR MODERATE GRAM POSITIVE COCCI FEW GRAM NEGATIVE RODS RARE YEAST RARE GRAM POSITIVE RODS    Culture   Final    ABUNDANT Normal respiratory flora-no Staph aureus or Pseudomonas seen Performed at [facility], [place] [address] [place], [place] [address]    Report Status 01/07/2022 FINAL  Final          Radiology Studies: No results found.      Scheduled Meds:  apixaban  5 mg Oral BID   arformoterol  15 mcg Nebulization BID   And   umeclidinium bromide  1 puff Inhalation Daily   budesonide (PULMICORT) nebulizer solution  0.25 mg Nebulization BID   buPROPion  150 mg Oral Daily   diclofenac Sodium  2 g Topical TID   doxycycline  100 mg Oral Q12H   DULoxetine  30 mg Oral Daily   guaiFENesin  600 mg Oral BID   insulin aspart  0-15 Units Subcutaneous TID WC   insulin glargine-yfgn  15 Units Subcutaneous Daily   ipratropium-albuterol  3 mL Nebulization BID   levothyroxine  75 mcg Oral QAC breakfast   lidocaine  2 patch Transdermal QHS   metoprolol succinate  12.5 mg Oral Daily   nicotine  14 mg Transdermal Daily   polyethylene glycol  17 g Oral BID   pravastatin  40 mg Oral q1800   predniSONE  20 mg Oral Q breakfast   senna-docusate  2 tablet Oral BID   sodium chloride flush  3 mL Intravenous Q12H   sodium chloride flush  3 mL Intravenous Q12H   torsemide  40 mg Oral Daily   Continuous Infusions:  sodium chloride     sodium chloride            Aline August, MD [facility] 01/09/2022, 7:15 AM

## 2022-01-09 NOTE — Progress Notes (Signed)
Patient heating blanket temp increased for back and hip pain per patient request.

## 2022-01-09 NOTE — Progress Notes (Addendum)
Mobility Specialist: Progress Note   01/09/22 1038  Mobility  Bed Position Chair  Activity Ambulated with assistance in hallway  Level of Assistance Modified independent, requires aide device or extra time  Assistive Device Four wheel walker  Distance Ambulated (ft) 400 ft  Activity Response Tolerated well  $Mobility charge 1 Mobility   Pre-Mobility 8 L/min HFNC: 62 HR, 93% SpO2 During Mobility 10 L/min [place]: 90-94% SpO2 Post-Mobility 8 L/min [place]: 66 HR, 94% SpO2  Pt received in chair and agreeable to mobility. C/o RLE pain, no rating given, otherwise asymptomatic. Pt to chair after session with all needs met.   [facility] Mobility Specialist Mobility Specialist 4 [place]: [phone] Mobility Specialist 2 [place] and [place]: [phone]

## 2022-01-09 NOTE — Progress Notes (Signed)
Morning CBG 100

## 2022-01-10 DIAGNOSIS — J9621 Acute and chronic respiratory failure with hypoxia: Secondary | ICD-10-CM

## 2022-01-10 DIAGNOSIS — I5033 Acute on chronic diastolic (congestive) heart failure: Secondary | ICD-10-CM | POA: Diagnosis not present

## 2022-01-10 LAB — GLUCOSE, CAPILLARY
Glucose-Capillary: 78 mg/dL (ref 70–99)
Glucose-Capillary: 110 mg/dL — ABNORMAL HIGH (ref 70–99)
Glucose-Capillary: 230 mg/dL — ABNORMAL HIGH (ref 70–99)
Glucose-Capillary: 128 mg/dL — ABNORMAL HIGH (ref 70–99)

## 2022-01-10 LAB — BASIC METABOLIC PANEL
Anion gap: 7 (ref 5–15)
BUN: 47 mg/dL — ABNORMAL HIGH (ref 8–23)
CO2: 26 mmol/L (ref 22–32)
Calcium: 9.1 mg/dL (ref 8.9–10.3)
Chloride: 102 mmol/L (ref 98–111)
Creatinine, Ser: 1.43 mg/dL — ABNORMAL HIGH (ref 0.44–1.00)
GFR, Estimated: 37 mL/min — ABNORMAL LOW (ref >60)
Glucose, Bld: 86 mg/dL (ref 70–99)
Potassium: 4.2 mmol/L (ref 3.5–5.1)
Sodium: 135 mmol/L (ref 135–145)

## 2022-01-10 LAB — MAGNESIUM: Magnesium: 2.4 mg/dL (ref 1.7–2.4)

## 2022-01-10 MED ORDER — ALUM & MAG HYDROXIDE-SIMETH 200-200-20 MG/5ML PO SUSP: 15.0000 mL | Freq: Four times a day (QID) | ORAL | Status: DC | PRN

## 2022-01-10 MED ORDER — PANTOPRAZOLE SODIUM 40 MG PO TBEC
40.0000 mg | DELAYED_RELEASE_TABLET | Freq: Every day | ORAL | Status: DC
  Administered 2022-01-10 – 2022-01-13 (×4): 40 mg via ORAL
  Filled 2022-01-10 (×4): qty 1

## 2022-01-10 NOTE — Progress Notes (Signed)
Patient ID: Elizabeth Melton, female   DOB: 1941/02/07, 81 y.o.   MRN: [id number]  PROGRESS NOTE    Elizabeth Melton  [id number]:[id number] DOB: 1941/03/14 DOA: 12/28/2021 PCP: Kathyrn Lass, MD   Brief Narrative:  81 year old female with history of COPD, interstitial lung disease, chronic respiratory failure with hypoxia on 4 to 5 L oxygen via nasal cannula at home, severe pulmonary hypertension, chronic diastolic CHF, diabetes mellitus type 2, hypertension, paroxysmal A. fib, history of pulmonary embolism on Eliquis, PAD presented with worsening shortness of breath.  EMS found her with oxygen saturations of 85% on 5 L, she was placed on BiPAP and transferred to the ED.  She had missed few doses of diuretics at home.  In ED, chest x-ray showed chronic findings; labs were unremarkable.  Pulmonary and cardiology were consulted.  She was started on IV Lasix with modest response.  Subsequently, pulmonary has signed off.  She has already been switched to oral torsemide by heart failure team.  PT recommended SNF placement.  Assessment & Plan:   Acute on chronic hypoxic respiratory failure Acute on chronic diastolic heart failure Severe pulmonary artery hypertension -Recent right heart cath on 11/23/2021 showed normal filling pressures, severe PAH and with low cardiac output.  Plan was to add Tyvaso as an outpatient. -Echo in 11/202 showed EF of 60 to 65% with moderate TR, PASP of 85 mmHg -Missed few doses of diuretics at home -Heart failure team following.  Right heart catheterization showed severe ongoing pulmonary hypertension.  Torsemide has been changed to 40 mg once daily.  Continue metoprolol succinate.  Strict input and output.  Daily weights.  Fluid restriction.  Negative balance of 8877 cc since presentation -Oxygen requirement worsening: Currently on 8 L oxygen via high flow nasal cannula.  Normally wears 5 L oxygen at home.  Wean off as able.   -Pulmonary was reconsulted on 01/05/2022: Patient was  started on oral doxycycline and prednisone. -Overall prognosis is guarded to poor.  Palliative care following.  Currently remains full code.  Acute on low chronic back pain -Appears musculoskeletal; no history of injury or fall -Lumbar spine and sacrum/coccyx x-rays on 01/03/2022 showed no fractures; showed first-degree anterolisthesis at L4-L5 level along with disc space narrowing. -Continue Voltaren gel, Robaxin and lidocaine patch. -Continue physical therapy. -Will need SNF placement  AKI on CKD stage IIIa -Improving: Creatinine 1.43 today.  Diuretic plan as above.  Monitor  Hyponatremia -Resolved.  Monitor.  Paroxysmal A. Fib -Currently rate controlled.  Continue Toprol-XL and Eliquis  COPD Interstitial lung disease -Outpatient follow-up with pulmonary.  Considering antifibrotic therapy as an outpatient.  Tobacco abuse -Continue nicotine patch.  Essential hypertension -Blood pressure stable.  Continue Toprol-XL and diuretics  Diabetes mellitus type 2 with hypoglycemia -A1c 7.6 in 10/2021.  Decrease long-acting insulin to 7 units daily.  Continue with CBGs with SSI  Hypothyroidism -Continue levothyroxine  Hyperlipidemia/PAD -Continue pravastatin  Constipation -Continue bowel regimen   DVT prophylaxis: Eliquis Code Status: Full Family Communication: Niece at bedside on 01/05/2022 Disposition Plan: Status is: Inpatient  Remains inpatient appropriate because: Of need for SNF placement   Consultants: Heart failure team/pulmonary/palliative care  Procedures: Right heart catheterization on 01/06/2022  Antimicrobials: None   Subjective: Patient seen and examined at bedside.  Poor historian.  Breathing feels better.  No agitation, fever, vomiting or worsening abdominal pain reported.  Still complains of intermittent back pain.   Objective: Vitals:   01/09/22 1943 01/09/22 2001 01/10/22 0026 01/10/22 0456  BP:  Marland Kitchen)  139/51 (!) 128/51 (!) 137/48  Pulse:  (!) 58  (!) 56 (!) 53  Resp:  _0 Temp:  98.5 F (36.9 C) (!) 97.3 F (36.3 C) 98 F (36.7 C)  TempSrc:  Oral Oral Oral  SpO2: 93% (!) 78% 97% 97%  Weight:    73.1 kg  Height:        Intake/Output Summary (Last 24 hours) at 01/10/2022 0718 Last data filed at 01/10/2022 0502 Gross per 24 hour  Intake 840 ml  Output 600 ml  Net 240 ml    Filed Weights   01/08/22 0402 01/09/22 0510 01/10/22 0456  Weight: 73.8 kg 73.4 kg 73.1 kg    Examination:  General exam: Appears chronically ill and deconditioned.  Currently on 8 L oxygen with via flow nasal cannula.  No distress  respiratory system: Bilateral decreased breath sounds at bases with bibasilar crackles  cardiovascular system: S1-S2 heard; intermittently bradycardic gastrointestinal system: Abdomen is mildly distended; soft and nontender.  Normal bowel sounds heard extremities: No cyanosis; trace lower extremity edema present  central nervous system: Awake; slow to respond.  Poor historian.  No focal neurological deficits.  Moving extremities  skin: No obvious petechiae/rashes  psychiatry: Currently not agitated.  Affect is flat.  Data Reviewed: I have personally reviewed following labs and imaging studies  CBC: Recent Labs  Lab 01/05/22 0456 01/06/22 0751  WBC 8.2  --   HGB 15.7* 17.0*   17.0*  HCT 48.4* 50.0*   50.0*  MCV 103.0*  --   PLT 206  --     Basic Metabolic Panel: Recent Labs  Lab 01/06/22 0324 01/06/22 0751 01/07/22 0320 01/08/22 0233 01/09/22 0451 01/10/22 0507  NA 135 138   139 135 135 135 135  K 4.2 4.2   4.1 4.1 3.9 3.6 4.2  CL 98  --  99 101 99 102  CO2 28  --  _1 GLUCOSE 111*  --  102* 60* 101* 86  BUN 41*  --  40* 52* 47* 47*  CREATININE 1.40*  --  1.34* 1.32* 1.21* 1.43*  CALCIUM 8.9  --  9.1 8.8* 9.1 9.1  MG 2.6*  --  2.6* 2.4 2.5* 2.4    GFR: Estimated Creatinine Clearance: 32.6 mL/min (A) (by C-G formula based on SCr of 1.43 mg/dL (H)). Liver Function Tests: No results  for input(s): AST, ALT, ALKPHOS, BILITOT, PROT, ALBUMIN in the last 168 hours.  No results for input(s): LIPASE, AMYLASE in the last 168 hours. No results for input(s): AMMONIA in the last 168 hours. Coagulation Profile: No results for input(s): INR, PROTIME in the last 168 hours. Cardiac Enzymes: No results for input(s): CKTOTAL, CKMB, CKMBINDEX, TROPONINI in the last 168 hours. BNP (last 3 results) No results for input(s): PROBNP in the last 8760 hours. HbA1C: No results for input(s): HGBA1C in the last 72 hours. CBG: Recent Labs  Lab 01/09/22 1114 01/09/22 1602 01/09/22 2100 01/09/22 2132 01/10/22 0614  GLUCAP 118* 188* 110* 113* 78    Lipid Profile: No results for input(s): CHOL, HDL, LDLCALC, TRIG, CHOLHDL, LDLDIRECT in the last 72 hours. Thyroid Function Tests: No results for input(s): TSH, T4TOTAL, FREET4, T3FREE, THYROIDAB in the last 72 hours. Anemia Panel: No results for input(s): VITAMINB12, FOLATE, FERRITIN, TIBC, IRON, RETICCTPCT in the last 72 hours. Sepsis Labs: Recent Labs  Lab 01/05/22 0456 01/06/22 0324 01/07/22 0320  PROCALCITON <0.10 <0.10 <0.10     Recent Results (from the  past 240 hour(s))  Expectorated Sputum Assessment w Gram Stain, Rflx to Resp Cult     Status: None   Collection Time: 01/05/22 11:18 AM   Specimen: Expectorated Sputum  Result Value Ref Range Status   Specimen Description EXPECTORATED SPUTUM  Final   Special Requests NONE  Final   Sputum evaluation   Final    THIS SPECIMEN IS ACCEPTABLE FOR SPUTUM CULTURE Performed at [facility], [place] [address], [place], [place] [address]    Report Status 01/05/2022 FINAL  Final  Culture, Respiratory w Gram Stain     Status: None   Collection Time: 01/05/22 11:18 AM  Result Value Ref Range Status   Specimen Description EXPECTORATED SPUTUM  Final   Special Requests NONE Reflexed from [id number]  Final   Gram Stain   Final    RARE SQUAMOUS EPITHELIAL CELLS PRESENT FEW WBC PRESENT,BOTH  PMN AND MONONUCLEAR MODERATE GRAM POSITIVE COCCI FEW GRAM NEGATIVE RODS RARE YEAST RARE GRAM POSITIVE RODS    Culture   Final    ABUNDANT Normal respiratory flora-no Staph aureus or Pseudomonas seen Performed at [facility], [place] [address] [place], [place] [address]    Report Status 01/07/2022 FINAL  Final          Radiology Studies: DG CHEST PORT 1 VIEW  Result Date: 01/09/2022 CLINICAL DATA:  Hypoxia. EXAM: PORTABLE CHEST 1 VIEW COMPARISON:  01/05/2022 FINDINGS: Stable enlarged cardiac silhouette and tortuous and calcified thoracic aorta. No significant change in diffuse prominence of the interstitial markings without superimposed airspace opacity or pleural fluid. Unremarkable bones. IMPRESSION: Stable cardiomegaly and pulmonary fibrosis. No acute abnormality. Stable cardiomegaly and pulmonary fibrosis. No acute abnormality. Electronically Signed   By: Claudie Revering M.D.   On: 01/09/2022 14:22        Scheduled Meds:  apixaban  5 mg Oral BID   arformoterol  15 mcg Nebulization BID   And   umeclidinium bromide  1 puff Inhalation Daily   budesonide (PULMICORT) nebulizer solution  0.25 mg Nebulization BID   buPROPion  150 mg Oral Daily   diclofenac Sodium  2 g Topical TID   doxycycline  100 mg Oral Q12H   DULoxetine  30 mg Oral Daily   guaiFENesin  600 mg Oral BID   insulin aspart  0-15 Units Subcutaneous TID WC   insulin glargine-yfgn  10 Units Subcutaneous Daily   ipratropium-albuterol  3 mL Nebulization BID   levothyroxine  75 mcg Oral QAC breakfast   lidocaine  2 patch Transdermal QHS   metoprolol succinate  12.5 mg Oral Daily   nicotine  14 mg Transdermal Daily   polyethylene glycol  17 g Oral BID   pravastatin  40 mg Oral q1800   predniSONE  20 mg Oral Q breakfast   senna-docusate  2 tablet Oral BID   sodium chloride flush  3 mL Intravenous Q12H   sodium chloride flush  3 mL Intravenous Q12H   torsemide  40 mg Oral Daily   Continuous Infusions:   sodium chloride     sodium chloride            Aline August, MD [facility] 01/10/2022, 7:18 AM

## 2022-01-10 NOTE — Progress Notes (Signed)
Mobility Specialist Progress Note:   01/10/22 1506  Mobility  Activity Ambulated with assistance in hallway  Level of Assistance Modified independent, requires aide device or extra time  Assistive Device Front wheel walker  Distance Ambulated (ft) 220 ft  Activity Response Tolerated well  $Mobility charge 1 Mobility   Pt received EOB willing to participate in mobility. No complaints of pain and asymptomatic. Pt left in chair with call bell in reach and all needs met.   [facility] Public librarian Phone [phone] Secondary Phone [phone]

## 2022-01-10 NOTE — Progress Notes (Signed)
This chaplain is present for F/U spiritual care with the Pt.   The Pt. and chaplain celebrated the decrease in the Pt. pain and her movement from the bed to the chair.  The chaplain listened reflectively as the Pt. participated in storytelling through a lens of God's love.   The Pt. is aware her discharge is dependent on reducing her O2. The chaplain understands the Pt. is curious about what she can do to help the process.  The Pt. family joined the Pt. at the end of the visit. The chaplain is available for F/U spiritual care as needed.  Chaplain Sallyanne Kuster [phone]

## 2022-01-10 NOTE — Progress Notes (Signed)
Patient ID: Elizabeth Melton, female   DOB: 1941/12/09, 81 y.o.   MRN: [id number]     Cardiology Rounding Note  PCP-Cardiologist: [name], MD   Subjective:     1/27: RHC demonstrated optimized filling pressures and severe PAH. Started back on steroids (prednisone) and doxycycline, per pulmonology.   Continues on torsemide 40 mg daily. Wt stable since RHC.  Scr stable, 1.43   O2 still 8L HFNC but she notes subjective improvement in breathing.   RHC Procedural Findings: Hemodynamics (mmHg) RA 5 RV 72/7 PA 68/27, mean 42 PCWP mean 9 Oxygen saturations: PA 62% AO 96% Cardiac Output (Fick) 3.38  Cardiac Index (Fick) 1.83 PVR 9.7 WU   Objective:   Weight Range: 73.1 kg Body mass index is 26 kg/m.   Vital Signs:   Temp:  [97.3 F (36.3 C)-98.5 F (36.9 C)] 97.3 F (36.3 C) (01/31 0735) Pulse Rate:  [53-72] 58 (01/31 0735) Resp:  [13-20] 17 (01/31 0735) BP: (119-139)/(48-56) 119/49 (01/31 0735) SpO2:  [78 %-98 %] 98 % (01/31 0853) Weight:  [73.1 kg] 73.1 kg (01/31 0456) Last BM Date: 01/08/22  Weight change: Filed Weights   01/08/22 0402 01/09/22 0510 01/10/22 0456  Weight: 73.8 kg 73.4 kg 73.1 kg    Intake/Output:   Intake/Output Summary (Last 24 hours) at 01/10/2022 0948 Last data filed at 01/10/2022 0911 Gross per 24 hour  Intake 720 ml  Output 600 ml  Net 120 ml      Physical Exam   General: NAD Neck: No JVD, no thyromegaly or thyroid nodule.  Lungs: Dry crackles CV: Nondisplaced PMI.  Heart regular S1/S2, no S3/S4, no murmur.  No peripheral edema.  No carotid bruit.  Normal pedal pulses.  Abdomen: Soft, nontender, no hepatosplenomegaly, no distention.  Skin: Intact without lesions or rashes.  Neurologic: Alert and oriented x 3.  Psych: Normal affect. Extremities: No clubbing or cyanosis.  HEENT: Normal.    Telemetry   NSR HR upper 60s, personally reviewed   Labs    CBC No results for input(s): WBC, NEUTROABS, HGB, HCT,  MCV, PLT in the last 72 hours.  Basic Metabolic Panel Recent Labs    01/09/22 0451 01/10/22 0507  NA 135 135  K 3.6 4.2  CL 99 102  CO2 26 26  GLUCOSE 101* 86  BUN 47* 47*  CREATININE 1.21* 1.43*  CALCIUM 9.1 9.1  MG 2.5* 2.4   Liver Function Tests No results for input(s): AST, ALT, ALKPHOS, BILITOT, PROT, ALBUMIN in the last 72 hours.  No results for input(s): LIPASE, AMYLASE in the last 72 hours. Cardiac Enzymes No results for input(s): CKTOTAL, CKMB, CKMBINDEX, TROPONINI in the last 72 hours.  BNP: BNP (last 3 results) Recent Labs    11/18/21 1610 12/28/21 1218 12/29/21 0331  BNP 241.9* 565.2* 821.0*    ProBNP (last 3 results) No results for input(s): PROBNP in the last 8760 hours.   D-Dimer No results for input(s): DDIMER in the last 72 hours. Hemoglobin A1C No results for input(s): HGBA1C in the last 72 hours. Fasting Lipid Panel No results for input(s): CHOL, HDL, LDLCALC, TRIG, CHOLHDL, LDLDIRECT in the last 72 hours. Thyroid Function Tests No results for input(s): TSH, T4TOTAL, T3FREE, THYROIDAB in the last 72 hours.  Invalid input(s): FREET3  Other results:   Imaging    DG CHEST PORT 1 VIEW  Result Date: 01/09/2022 CLINICAL DATA:  Hypoxia. EXAM: PORTABLE CHEST 1 VIEW COMPARISON:  01/05/2022 FINDINGS: Stable enlarged cardiac silhouette and  tortuous and calcified thoracic aorta. No significant change in diffuse prominence of the interstitial markings without superimposed airspace opacity or pleural fluid. Unremarkable bones. IMPRESSION: Stable cardiomegaly and pulmonary fibrosis. No acute abnormality. Stable cardiomegaly and pulmonary fibrosis. No acute abnormality. Electronically Signed   By: Claudie Revering M.D.   On: 01/09/2022 14:22     Medications:     Scheduled Medications:  apixaban  5 mg Oral BID   arformoterol  15 mcg Nebulization BID   And   umeclidinium bromide  1 puff Inhalation Daily   budesonide (PULMICORT) nebulizer solution  0.25  mg Nebulization BID   buPROPion  150 mg Oral Daily   diclofenac Sodium  2 g Topical TID   doxycycline  100 mg Oral Q12H   DULoxetine  30 mg Oral Daily   guaiFENesin  600 mg Oral BID   insulin aspart  0-15 Units Subcutaneous TID WC   insulin glargine-yfgn  10 Units Subcutaneous Daily   ipratropium-albuterol  3 mL Nebulization BID   levothyroxine  75 mcg Oral QAC breakfast   lidocaine  2 patch Transdermal QHS   metoprolol succinate  12.5 mg Oral Daily   nicotine  14 mg Transdermal Daily   polyethylene glycol  17 g Oral BID   pravastatin  40 mg Oral q1800   predniSONE  20 mg Oral Q breakfast   senna-docusate  2 tablet Oral BID   sodium chloride flush  3 mL Intravenous Q12H   sodium chloride flush  3 mL Intravenous Q12H   torsemide  40 mg Oral Daily    Infusions:  sodium chloride     sodium chloride      PRN Medications: sodium chloride, sodium chloride, acetaminophen **OR** acetaminophen, acetaminophen, albuterol, alum & mag hydroxide-simeth, bisacodyl, guaiFENesin-dextromethorphan, methocarbamol, ondansetron (ZOFRAN) IV, sodium chloride flush, sodium chloride flush, traMADol    Patient Profile   Elizabeth Melton is a 81 year old with a history of PE, PAF, ILD, COPD, and diastolic CHF with RV failure, and HFpEF.    Admitted with A/C respiratory failure + A/C HFpEF  Assessment/Plan   1. A/C Hypoxic Respiratory Failure: On 5 liters at home. Placed on Bipap in the ED and had weaned back to 5 liters. Currently on HFNC 9L. Multifactorial hypoxemia: ILD, PAH and A/c CHF.  She does not appear significantly volume overloaded on exam and RHC today showed optimized filling pressures with ongoing moderate PAH.  No fever, WBCs have not been elevated and PCT has been normal (<0.1 today). She has been restarted on prednisone and doxycycline by pulmonary. CXR 1/26 suggest that there was an acute process?  Edema versus pneumonia which is improved but oxygen requirements remain high, 8L HFNC. [place] 1/27  demonstrated optimized filling pressures and severe PAH. - Prednisone per pulmonary.  - doxycyline per pulmonary - Continue torsemide 40 mg daily  2. A/C HFpEF---> RV Failure: Echo in 11/22 showed EF 60-65%, mildly decreased RV systolic function with mild RV enlargement, mild Elizabeth, moderate TR, PASP 85 mmHg, mild aortic stenosis and mild aortic insufficiency, IVC dilated. The RV failure may be primarily due to pulmonary hypertension. RHC in 12/22 showed normal filling pressures with severe PH.  She was diuresed here, not volume overloaded by exam and repeated RHC which showed normal filling pressures, moderate PAH.  Creatinine has been up and down, 1.43 today.  - Torsemide 40 mg daily for now.  3. PAH: Pulmonary venous hypertension on 7/22 RHC.  However, last echo in 11/22 showed PASP up  to 85 mmHg. No evidence for chronic PE on 9/22 V/Q scan.  RHC in 12/22 showed normal filling pressures with severe pulmonary arterial hypertension.  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   [place] 1/27 showed ongoing moderate PAH. - With PH-ILD, a trial of Tyvaso-DPI planned. Tyvaso application pending.  4. PAF: Maintaining SR.  -On Eliquis 5 mg twice a day.  5. ILD: Followed by Dr. [name].  Seen by pulmonary here. Treating w/ Prednisone 20 mg daily. Pulmonary to see again today.  6. DMII -On SSI 7. AKI on CKD Stage IIIa: Creatinine up and down, 1.43 today.  - torsemide 40 mg daily.  8.   Hypothyroidism - On Levothyroxine.  9.Tobacco Abuse: Last smoked 2 weeks ago.  10. Back pain: Her main complaint. Per [facility].   Stable from cardiac standpoint, cardiology to sign off.  Will arrange for Tyvaso as outpatient.   Loralie Champagne 01/10/2022

## 2022-01-10 NOTE — Progress Notes (Signed)
Mobility Specialist Progress Note:   01/10/22 1323  Mobility  Activity Ambulated with assistance in room  Level of Assistance Modified independent, requires aide device or extra time  Assistive Device BSC  Distance Ambulated (ft) 4 ft  Activity Response Tolerated well  $Mobility charge 1 Mobility   Pt received in bed wanting to use BSC. Left on BSC with call bell in reach, NT notified.  Will follow-up later for further ambulation.   [facility] Public librarian Phone [phone] Secondary Phone [phone]

## 2022-01-10 NOTE — Progress Notes (Signed)
Patient wearing oxygen set at 5lpm with Sp02=95% with respiratory rate of 22 bpm. Patient has no c/o shortness of breath. Bipap in room on standby if needed.

## 2022-01-10 NOTE — Progress Notes (Signed)
NAME:  Elizabeth Melton, MRN:  [id number], DOB:  1941/02/19, LOS: 1 ADMISSION DATE:  12/28/2021, CONSULTATION DATE:  12/29/2021 REFERRING MD:  Dr. Broadus John, CHIEF COMPLAINT:  Acute respiratory failure w/ hypoxia   History of Present Illness:  81 year-old female who presented to [facility] 1/18 with respiratory distress. PMHx significant for chronic diastolic CHF (patient of Dr. Aundra Dubin), severe pulm hypertension, chronic respiratory failure, COPD, ILD (patient of Dr. [name]) on 5L HOT.   Patient was presented to [facility] ED 1/18. Sats initially 85% on 5L [place] (sats are typically 88-89%).  Patient placed on BiPAP.  States she missed some of her water pills.  Denies fever.  BNP 565.  CXR showing chronic lung injury.  Lasix and Albuterol neb given.  Initially no signs of eczema/ILD flare.  On 1/19, patient continued to have desaturation episodes requiring more frequent BiPAP.  Patient transitioned to [place] HFNC 8L with sats 90%.  Question ILD is cause of respiratory distress.  PCCM asked for pulmonary consult.   Pertinent Medical History:   Past Medical History:  Diagnosis Date   Acute on chronic diastolic (congestive) heart failure ([place]) 05/19/2021   Acute respiratory failure with hypoxia (HCC) 09/06/2020   Atrial fibrillation with RVR (HCC)    Diabetes mellitus without complication (HCC)    Hyperlipidemia    Hypertension    Hypothyroidism    IPF (idiopathic pulmonary fibrosis) ([place])    Pneumonia due to COVID-19 virus 01/16/2021   Thrombocytopenia ([place])    Significant Hospital Events: Including procedures, antibiotic start and stop dates in addition to other pertinent events   1/18: admitted to [facility] 1/19: PCCM consulted for inability to wean from BiPAP 1/20: on [place] HFNC 8L 1/26 increased to 9 L 1/31 Subjective significant improvement in respiratory status, stable on 8L [place]   Interim History / Subjective:  Feeling very well this morning Breathing is much improved Stable on 8L  HFNC/[place] Ambulating with PT, RN - tolerating well Motivated to get out of the hospital  Objective:   Vitals:   01/10/22 0456 01/10/22 0735  BP: (!) 137/48 (!) 119/49  Pulse: (!) 53 (!) 58  Resp: 13 17  Temp: 98 F (36.7 C) (!) 97.3 F (36.3 C)  SpO2: 97% (!) 88%        Filed Weights    12/28/21 2006 12/29/21 0513 12/30/21 0330  Weight: 75.5 kg 75.1 kg 75.7 kg    Physical Examination: General: Chronically ill-appearing elderly woman in NAD. HEENT: [place]/AT, anicteric sclera, PERRL, moist mucous membranes. Neuro: Awake, oriented x 4. Responds to verbal stimuli. Following commands consistently. Moves all 4 extremities spontaneously. CV: RRR, no m/g/r. PULM: Breathing even and unlabored on 8L HFNC. Lung fields CTAB in upper fields, slightly diminished at bilateral bases. GI: Soft, nontender, nondistended. Normoactive bowel sounds. Extremities: No LE edema noted. Skin: Warm/dry, no rashes.  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 Suspect part of this process has been acute pulmonary edema. COPD with AE - Continue supplemental O2 support - Wean O2 for sat > 90% - Encourage mobility; may need  more home O2 for ambulation than at rest - Repeat O2 need assessment prior to discharge for CSW purposes - Pulmonary hygiene - Diuresis as tolerated - Continue bronchodilators, discharge on inhaled triple therapy - Continue antibiotics  Acute-on-chronic diastolic congestive heart failure with biventricular failure - Appreciate AHF team's  management - Continue torsemide - Monitor I&Os, daily weights  Severe pulmonary hypertension; WHO group III.  Mean PAP 42 with PCWP 9. PVR 9.7 WU. CO 3.38, CI 1.83. 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 [name] as outpatient-has appointment scheduled for 2/10. Per office visit 1/5 with MR, antifibrotic's were being considered in the future. Would defer this to an outpatient decision; there is no benefit in the acute setting. Agree that this is not convincing for IPF flare given fluctuating oxygen requirements. - Follow up with outpatient pulmonary (Dr. [name]) 2/10 - HRCT scheduled for 02/14/2022  [place] - Prognosis remains guarded, given multifactorial lung involvement - Moving toward advanced therapies - Recommend proactive Palliative Care conversations in the event patient does not improve  Critical care time: N/A   Rhae Lerner [facility] 01/10/22 7:46 AM  Please see [email or web address] for pager details.  From 7A-7P if no response, please call [phone] After hours, please call ELink [phone]

## 2022-01-10 NOTE — TOC Progression Note (Addendum)
Transition of Care [facility]) - Progression Note    Patient Details  Name: Elizabeth Melton MRN: [id number] Date of Birth: [birth year]  Transition of [facility]) CM/SW Contact  Ellin Fitzgibbons, LCSW Phone Number: 01/10/2022, 3:10 PM  Clinical Narrative:    HF CSW spoke with Ms. Witte at bedside and she reported to be ready to discharge and is wondering why she is still in the hospital. CSW informed Ms. Iannaccone that she is still on 8L of O2 and she needs to be weaned to her baseline of 5L before she can be discharged to a SNF for rehab. CSW asked to see Ms. Virella's updated insurance card she presented her [employer] card member ID#: [id number] card issued 12/31/2021. 2:48pm - HF CSW spoke with the patient's daughter, [name] [phone] who reported preference for [facility], [facility] or [facility]. CSW to call and see who can extend a bed offer. 3:25pm - HF CSW spoke to [place] at [facility] [place] who reported that she cannot take anyone with more than 3 or 4L of 02 and therefore she wouldn't be able to extend a bed offer to Ms. Rust at this time. 3:57pm - HF CSW reached out to [facility] and they reported that Ms. Dacruz would need to be weaned down to at least 4 or 5L of O2 before they could offer a bed at this time. 4:30pm - HF CSW reached out to the treatment team via secure chat to make everyone aware regarding the concern with her disposition and plan moving forward. Ms. Adamec is still on 8L of O2 and no SNF will take her at DC on 8L she needs to be weaned to her baseline of at least 5L. If this isn't possible and she isn't able to wean then SNF won't be an option for DC.   CSW will continue to follow throughout discharge.  Expected Discharge Plan: [place] Barriers to Discharge: Continued Medical Work up  Expected Discharge Plan and Services Expected Discharge Plan: [place] In-house Referral: Clinical Social Work Discharge Planning Services: CM Consult Post Acute  Care Choice: [place] arrangements for the past 2 months: Apartment Expected Discharge Date: 01/06/22                 DME Agency: [employer] Date DME Agency Contacted: 12/30/21 Time DME Agency Contacted: 1600 Representative spoke with at DME Agency: [place] ([place]) Interventions    Readmission Risk Interventions Readmission Risk Prevention Plan 09/10/2020  Post Dischage Appt Complete  Medication Screening Complete  Transportation Screening Complete  Some recent data might be hidden   Kamica Florance, MSW, LCSW [phone] Heart Failure Social Worker

## 2022-01-10 NOTE — Plan of Care (Signed)
Problem: Activity: Goal: Capacity to carry out activities will improve Outcome: Progressing   Problem: Clinical Measurements: Goal: Diagnostic test results will improve Outcome: Progressing Goal: Respiratory complications will improve Outcome: Progressing Goal: Cardiovascular complication will be avoided Outcome: Progressing   Problem: Coping: Goal: Level of anxiety will decrease Outcome: Progressing   Problem: Pain Managment: Goal: General experience of comfort will improve Outcome: Progressing

## 2022-01-10 NOTE — Progress Notes (Incomplete)
Patient ID: Elizabeth Melton, female   DOB: [birth year], 81 y.o.   MRN: [id number]    Progress Note from the Palliative Medicine Team at [facility]   Patient Name: Elizabeth Melton        Date: 01/10/2022 DOB: 12-03-1941  Age: 81 y.o. MRN#: [id number] Attending Physician: Aline August, MD Primary Care Physician: Kathyrn Lass, MD Admit Date: 12/28/2021   Medical records reviewed  Patient is a 81 year-old female with pertinent PMH chronic diastolic CHF (patient of Dr. Aundra Dubin), severe pulm hypertension, chronic respiratory failure, COPD, ILD (patient of Dr. [name]) on 5L O2 presents to [facility] on 1/18 with respiratory distress.    This NP visited patient at the bedside as a follow up to  yesterday's [place].  Ms Bacchi appears tired today with increased O2 needs/HFNC 8 liters, Creatine 1.74  Education again offered on her serious lung and heart diseases (ILD, COPD, and diastolic CHF with RV failure) and the natural trajectory.  We discussed the limitations of medical interventions to prolong quality of life when a body fails to thrive.  Education offered on the difference between an aggressive medical intervention path and a palliative comfort path for this patient at this time in this situation.  She understands this.  She remains hopeful for medical management of her chronic diseases and trusts in her faith to guide her in her decisions.  Duiscussed with TOC  Questions and concerns addressed     Wadie Lessen NP  Palliative Medicine Team Team Phone # [phone] Pager [phone]

## 2022-01-11 ENCOUNTER — Inpatient Hospital Stay: Payer: Medicare HMO

## 2022-01-11 ENCOUNTER — Telehealth: Payer: Self-pay | Admitting: *Deleted

## 2022-01-11 DIAGNOSIS — I5032 Chronic diastolic (congestive) heart failure: Secondary | ICD-10-CM

## 2022-01-11 DIAGNOSIS — G4733 Obstructive sleep apnea (adult) (pediatric): Secondary | ICD-10-CM

## 2022-01-11 DIAGNOSIS — I272 Pulmonary hypertension, unspecified: Secondary | ICD-10-CM

## 2022-01-11 DIAGNOSIS — G4719 Other hypersomnia: Secondary | ICD-10-CM

## 2022-01-11 DIAGNOSIS — M545 Low back pain, unspecified: Secondary | ICD-10-CM | POA: Diagnosis present

## 2022-01-11 LAB — BASIC METABOLIC PANEL
Anion gap: 9 (ref 5–15)
BUN: 42 mg/dL — ABNORMAL HIGH (ref 8–23)
CO2: 28 mmol/L (ref 22–32)
Calcium: 9.4 mg/dL (ref 8.9–10.3)
Chloride: 101 mmol/L (ref 98–111)
Creatinine, Ser: 1.23 mg/dL — ABNORMAL HIGH (ref 0.44–1.00)
GFR, Estimated: 45 mL/min — ABNORMAL LOW (ref >60)
Glucose, Bld: 104 mg/dL — ABNORMAL HIGH (ref 70–99)
Potassium: 4.2 mmol/L (ref 3.5–5.1)
Sodium: 138 mmol/L (ref 135–145)

## 2022-01-11 LAB — CBC WITH DIFFERENTIAL/PLATELET
Abs Immature Granulocytes: 0.03 10*3/uL (ref 0.00–0.07)
Basophils Absolute: 0.1 10*3/uL (ref 0.0–0.1)
Basophils Relative: 1 %
Eosinophils Absolute: 0 10*3/uL (ref 0.0–0.5)
Eosinophils Relative: 0 %
HCT: 49.1 % — ABNORMAL HIGH (ref 36.0–46.0)
Hemoglobin: 15.9 g/dL — ABNORMAL HIGH (ref 12.0–15.0)
Immature Granulocytes: 0 %
Lymphocytes Relative: 25 %
Lymphs Abs: 2.2 10*3/uL (ref 0.7–4.0)
MCH: 32.9 pg (ref 26.0–34.0)
MCHC: 32.4 g/dL (ref 30.0–36.0)
MCV: 101.4 fL — ABNORMAL HIGH (ref 80.0–100.0)
Monocytes Absolute: 0.6 10*3/uL (ref 0.1–1.0)
Monocytes Relative: 7 %
Neutro Abs: 5.9 10*3/uL (ref 1.7–7.7)
Neutrophils Relative %: 67 %
Platelets: 195 10*3/uL (ref 150–400)
RBC: 4.84 MIL/uL (ref 3.87–5.11)
RDW: 15.8 % — ABNORMAL HIGH (ref 11.5–15.5)
WBC: 8.8 10*3/uL (ref 4.0–10.5)
nRBC: 0 % (ref 0.0–0.2)

## 2022-01-11 LAB — MAGNESIUM: Magnesium: 2.5 mg/dL — ABNORMAL HIGH (ref 1.7–2.4)

## 2022-01-11 LAB — GLUCOSE, CAPILLARY
Glucose-Capillary: 82 mg/dL (ref 70–99)
Glucose-Capillary: 94 mg/dL (ref 70–99)
Glucose-Capillary: 103 mg/dL — ABNORMAL HIGH (ref 70–99)
Glucose-Capillary: 86 mg/dL (ref 70–99)

## 2022-01-11 NOTE — Progress Notes (Signed)
Physical Therapy Treatment Patient Details Name: Elizabeth Melton MRN: [id number] DOB: 04-May-1941 Today's Date: 01/11/2022   History of Present Illness 81 y.o. female was admitted 1/18 after recent admits with SOB and was hypoxic on home O2 of 5L.  Has new CPAP orders.  Pt is in CHF with diuresing and using purwick with significant low baseline sats 87-89%.  PMHx:  CHF, DM, HTN, HLD, hypothyroidism, PE, on ELiquis, pulm fibrosis on home O2, smoker, CKD, PAF, PAD, ILD, pulm HTN,    PT Comments    Pt was seen for assisting with gait but declined due to abd discomfort of needing to sit on BSC.  Pt did perform sitting strengthening to BLE's, and noted her quality of effort being good as well as her continued improvement on the LE strength.  Pt will get therapy as scheduled, and should try steps for home when she is seen next.  Follow acutely for goals of PT.   Recommendations for follow up therapy are one component of a multi-disciplinary discharge planning process, led by the attending physician.  Recommendations may be updated based on patient status, additional functional criteria and insurance authorization.  Follow Up Recommendations  Skilled nursing-short term rehab (<3 hours/day)     Assistance Recommended at Discharge Frequent or constant Supervision/Assistance  Patient can return home with the following A little help with walking and/or transfers;A little help with bathing/dressing/bathroom;Assistance with cooking/housework;Help with stairs or ramp for entrance;Assist for transportation   Equipment Recommendations  None recommended by PT    Recommendations for Other Services       Precautions / Restrictions Precautions Precautions: Fall Precaution Comments: on O2 with all movement Restrictions Weight Bearing Restrictions: No     Mobility  Bed Mobility Overal bed mobility: Needs Assistance             General bed mobility comments: up in chair when PT arrived     Transfers Overall transfer level: Needs assistance                 General transfer comment: declined as she was feeling her stomach was too upset    Ambulation/Gait               General Gait Details: declined   Stairs             Wheelchair Mobility    Modified Rankin (Stroke Patients Only)       Balance                                            Cognition Arousal/Alertness: Awake/alert Behavior During Therapy: WFL for tasks assessed/performed Overall Cognitive Status: Within Functional Limits for tasks assessed                                          Exercises      General Comments General comments (skin integrity, edema, etc.): pt is feeling better overall but is noticing an abd discomfort that may just be need to move bowels      Pertinent Vitals/Pain Pain Assessment Pain Assessment: No/denies pain    Home Living                          Prior Function  PT Goals (current goals can now be found in the care plan section) Acute Rehab PT Goals Patient Stated Goal: to go home with family assistance Progress towards PT goals: Not progressing toward goals - comment    Frequency    Min 3X/week      PT Plan Current plan remains appropriate    Co-evaluation              AM-PAC PT "6 Clicks" Mobility   Outcome Measure  Help needed turning from your back to your side while in a flat bed without using bedrails?: A Little Help needed moving from lying on your back to sitting on the side of a flat bed without using bedrails?: A Little Help needed moving to and from a bed to a chair (including a wheelchair)?: A Little Help needed standing up from a chair using your arms (e.g., wheelchair or bedside chair)?: A Little Help needed to walk in hospital room?: A Little Help needed climbing 3-5 steps with a railing? : A Little 6 Click Score: 18    End of Session Equipment  Utilized During Treatment: Oxygen Activity Tolerance: Patient limited by fatigue;Treatment limited secondary to medical complications (Comment) Patient left: in chair;with call bell/phone within reach Nurse Communication: Mobility status PT Visit Diagnosis: Unsteadiness on feet (R26.81);Muscle weakness (generalized) (M62.81);Difficulty in walking, not elsewhere classified (R26.2)     Time: 3005-1102 PT Time Calculation (min) (ACUTE ONLY): 17 min  Charges:  $Therapeutic Exercise: 8-22 mins        Ramond Dial 01/11/2022, 4:15 PM  Mee Hives, PT PhD Acute Rehab Dept. Number: [place]

## 2022-01-11 NOTE — Progress Notes (Signed)
Mobility Specialist Progress Note:   01/11/22 1311  Mobility  Bed Position Chair  Activity Transferred to/from [facility]  Level of Assistance Standby assist, set-up cues, supervision of patient - no hands on  Assistive Device BSC  Distance Ambulated (ft) 4 ft  Activity Response Tolerated well  $Mobility charge 1 Mobility   Pt stating her stomach not feeling well, wanting to sit on [facility] before further ambulation. Left on BSC and instructed to use call bell when finished.   [facility] Public librarian Phone [phone] Secondary Phone [phone]

## 2022-01-11 NOTE — Progress Notes (Signed)
Pt refuses BIPAP for tonight.. No distress noted.

## 2022-01-11 NOTE — Progress Notes (Signed)
Patient ID: Elizabeth Melton, female   DOB: 05-Jun-1941, 81 y.o.   MRN: [id number]  PROGRESS NOTE    TONIYAH DILMORE  DGS:[id number] DOB: 07-Feb-1941 DOA: 12/28/2021 PCP: Kathyrn Lass, MD   Brief Narrative:  81 year old female with history of COPD, interstitial lung disease, chronic respiratory failure with hypoxia on 4 to 5 L oxygen via nasal cannula at home, severe pulmonary hypertension, chronic diastolic CHF, diabetes mellitus type 2, hypertension, paroxysmal A. fib, history of pulmonary embolism on Eliquis, PAD presented with worsening shortness of breath.  EMS found her with oxygen saturations of 85% on 5 L, she was placed on BiPAP and transferred to the ED.  She had missed few doses of diuretics at home.  In ED, chest x-ray showed chronic findings; labs were unremarkable.  Pulmonary and cardiology were consulted.  She was started on IV Lasix with modest response.  Subsequently, pulmonary has signed off.  She has already been switched to oral torsemide by heart failure team.  PT recommended SNF placement.  Assessment & Plan:   Acute on chronic hypoxic respiratory failure Acute on chronic diastolic heart failure Severe pulmonary artery hypertension -Recent right heart cath on 11/23/2021 showed normal filling pressures, severe PAH and with low cardiac output.  Plan was to add Tyvaso as an outpatient. -Echo in 11/202 showed EF of 60 to 65% with moderate TR, PASP of 85 mmHg -Missed few doses of diuretics at home -Heart failure team following.  Right heart catheterization showed severe ongoing pulmonary hypertension.  -Diuresed with IV Lasix this admission, clinically euvolemic now, transition to oral torsemide, continue metoprolol -Oxygen needs continue to fluctuate, back on 7 L O2 this morning, weaned down to 5 -Was seen by pulmonary again, restarted on short course of steroids and doxycycline, now off -Overall prognosis remains poor, palliative care team following, patient remains full  code, requests full scope of treatment -Relatively stable, has been awaiting SNF, placement limited by O2 needs  Acute on low chronic back pain -Appears musculoskeletal; no history of injury or fall -Lumbar spine and sacrum/coccyx x-rays on 01/03/2022 showed no fractures; showed first-degree anterolisthesis at L4-L5 level along with disc space narrowing. -Continue Voltaren gel, Robaxin and lidocaine patch. -Continue physical therapy. -Plan for SNF, short-term rehab  AKI on CKD stage IIIa -Improving: Creatinine 1.43 today.  Diuretic plan as above.  Monitor  Hyponatremia -Resolved.  Monitor.  Paroxysmal A. Fib -Currently rate controlled.  Continue Toprol-XL and Eliquis  COPD Interstitial lung disease -Outpatient follow-up with pulmonary.  Considering antifibrotic therapy as an outpatient.  Tobacco abuse -Continue nicotine patch.  Essential hypertension -Blood pressure stable.  Continue Toprol-XL and diuretics  Diabetes mellitus type 2 with hypoglycemia -A1c 7.6 in 10/2021.  Decrease long-acting insulin to 7 units daily.  Continue with CBGs with SSI  Hypothyroidism -Continue levothyroxine  Hyperlipidemia/PAD -Continue pravastatin  Constipation -Continue bowel regimen   DVT prophylaxis: Eliquis Code Status: Full Family Communication: Discussed patient in detail, no family at bedside Disposition Plan: Status is: Inpatient  Remains inpatient appropriate because: Of need for SNF placement   Consultants: Heart failure team/pulmonary/palliative care  Procedures: Right heart catheterization on 01/06/2022  Antimicrobials: None   Subjective: -Overall better, still has intermittent back pain, O2 needs continue to fluctuate, 6 to 7 L Objective: Vitals:   01/11/22 0823 01/11/22 0844 01/11/22 0845 01/11/22 1104  BP: 121/60  121/60 (!) 130/59  Pulse: (!) 58  60 (!) 59  Resp: _0 Temp: (!) 97.4 F (36.3 C)  98.3 F (36.8 C)  TempSrc: Oral   Oral  SpO2: 98%  98% 96% 93%  Weight:      Height:        Intake/Output Summary (Last 24 hours) at 01/11/2022 1424 Last data filed at 01/11/2022 2666 Gross per 24 hour  Intake 951 ml  Output 900 ml  Net 51 ml   Filed Weights   01/09/22 0510 01/10/22 0456 01/11/22 0455  Weight: 73.4 kg 73.1 kg 72 kg    Examination:  General exam: Chronically ill female deconditioned, sitting up in bed, currently on 7 L O2, no distress HEENT: No JVD CVS: S1-S2,, regular rate rhythm Lungs: Few basilar rales Abdomen: Soft, nontender, bowel sounds present Extremities: No edema Skin: No rashes on exposed skin Psych: Appropriate mood and affect   Data Reviewed: I have personally reviewed following labs and imaging studies  CBC: Recent Labs  Lab 01/05/22 0456 01/06/22 0751 01/11/22 0449  WBC 8.2  --  8.8  NEUTROABS  --   --  5.9  HGB 15.7* 17.0*   17.0* 15.9*  HCT 48.4* 50.0*   50.0* 49.1*  MCV 103.0*  --  101.4*  PLT 206  --  648   Basic Metabolic Panel: Recent Labs  Lab 01/07/22 0320 01/08/22 0233 01/09/22 0451 01/10/22 0507 01/11/22 0449  NA 135 135 135 135 138  K 4.1 3.9 3.6 4.2 4.2  CL 99 101 99 102 101  CO2 _0 GLUCOSE 102* 60* 101* 86 104*  BUN 40* 52* 47* 47* 42*  CREATININE 1.34* 1.32* 1.21* 1.43* 1.23*  CALCIUM 9.1 8.8* 9.1 9.1 9.4  MG 2.6* 2.4 2.5* 2.4 2.5*   GFR: Estimated Creatinine Clearance: 37.7 mL/min (A) (by C-G formula based on SCr of 1.23 mg/dL (H)). Liver Function Tests: No results for input(s): AST, ALT, ALKPHOS, BILITOT, PROT, ALBUMIN in the last 168 hours.  No results for input(s): LIPASE, AMYLASE in the last 168 hours. No results for input(s): AMMONIA in the last 168 hours. Coagulation Profile: No results for input(s): INR, PROTIME in the last 168 hours. Cardiac Enzymes: No results for input(s): CKTOTAL, CKMB, CKMBINDEX, TROPONINI in the last 168 hours. BNP (last 3 results) No results for input(s): PROBNP in the last 8760 hours. HbA1C: No results for  input(s): HGBA1C in the last 72 hours. CBG: Recent Labs  Lab 01/10/22 1136 01/10/22 1632 01/10/22 2108 01/11/22 0605 01/11/22 1102  GLUCAP 110* 230* 128* 82 94   Lipid Profile: No results for input(s): CHOL, HDL, LDLCALC, TRIG, CHOLHDL, LDLDIRECT in the last 72 hours. Thyroid Function Tests: No results for input(s): TSH, T4TOTAL, FREET4, T3FREE, THYROIDAB in the last 72 hours. Anemia Panel: No results for input(s): VITAMINB12, FOLATE, FERRITIN, TIBC, IRON, RETICCTPCT in the last 72 hours. Sepsis Labs: Recent Labs  Lab 01/05/22 0456 01/06/22 0324 01/07/22 0320  PROCALCITON <0.10 <0.10 <0.10    Recent Results (from the past 240 hour(s))  Expectorated Sputum Assessment w Gram Stain, Rflx to Resp Cult     Status: None   Collection Time: 01/05/22 11:18 AM   Specimen: Expectorated Sputum  Result Value Ref Range Status   Specimen Description EXPECTORATED SPUTUM  Final   Special Requests NONE  Final   Sputum evaluation   Final    THIS SPECIMEN IS ACCEPTABLE FOR SPUTUM CULTURE Performed at [facility], [place] [address], [place], [place] [address]    Report Status 01/05/2022 FINAL  Final  Culture, Respiratory w Gram Stain  Status: None   Collection Time: 01/05/22 11:18 AM  Result Value Ref Range Status   Specimen Description EXPECTORATED SPUTUM  Final   Special Requests NONE Reflexed from [id number]  Final   Gram Stain   Final    RARE SQUAMOUS EPITHELIAL CELLS PRESENT FEW WBC PRESENT,BOTH PMN AND MONONUCLEAR MODERATE GRAM POSITIVE COCCI FEW GRAM NEGATIVE RODS RARE YEAST RARE GRAM POSITIVE RODS    Culture   Final    ABUNDANT Normal respiratory flora-no Staph aureus or Pseudomonas seen Performed at [facility], [place] [address] [place], [place] [address]    Report Status 01/07/2022 FINAL  Final          Radiology Studies: No results found.      Scheduled Meds:  apixaban  5 mg Oral BID   arformoterol  15 mcg Nebulization BID   And   umeclidinium  bromide  1 puff Inhalation Daily   budesonide (PULMICORT) nebulizer solution  0.25 mg Nebulization BID   buPROPion  150 mg Oral Daily   diclofenac Sodium  2 g Topical TID   doxycycline  100 mg Oral Q12H   DULoxetine  30 mg Oral Daily   guaiFENesin  600 mg Oral BID   insulin aspart  0-15 Units Subcutaneous TID WC   insulin glargine-yfgn  10 Units Subcutaneous Daily   ipratropium-albuterol  3 mL Nebulization BID   levothyroxine  75 mcg Oral QAC breakfast   lidocaine  2 patch Transdermal QHS   metoprolol succinate  12.5 mg Oral Daily   nicotine  14 mg Transdermal Daily   pantoprazole  40 mg Oral Daily   polyethylene glycol  17 g Oral BID   pravastatin  40 mg Oral q1800   senna-docusate  2 tablet Oral BID   sodium chloride flush  3 mL Intravenous Q12H   sodium chloride flush  3 mL Intravenous Q12H   torsemide  40 mg Oral Daily   Continuous Infusions:  sodium chloride     sodium chloride      Domenic Polite, MD [facility] 01/11/2022, 2:24 PM

## 2022-01-11 NOTE — Telephone Encounter (Signed)
-----  Message from Sueanne Margarita, MD sent at 12/29/2021  3:21 PM EST ----- Please let patient know that they have sleep apnea.  Recommend therapeutic CPAP titration ASAP for treatment of patient's sleep disordered breathing.  If unable to perform an in lab titration then initiate ResMed auto CPAP from 4 to 15cm H2O with heated humidity and mask of choice and overnight pulse ox on CPAP.

## 2022-01-11 NOTE — TOC Progression Note (Addendum)
Transition of Care [facility]) - Progression Note    Patient Details  Name: Elizabeth Melton MRN: [id number] Date of Birth: 29-Apr-1941  Transition of Care [facility]) CM/SW Contact  Zaylin Runco, LCSW Phone Number: 01/11/2022, 11:25 AM  Clinical Narrative:    HF CSW reached out to [facility] to see if they have a bed available for tomorrow however the only thing they might have is for Friday. HF CSW reached out to [facility] to see if Olivia Mackie can extend a bed offer and accommodate the 5L of O2 and she will take a look in the hub and let the CSW so the insurance authorization can be initiated on the [employer] portal. HF CSW attempted to start the insurance authorization on the online [employer] portal with the [employer] member ID: [id number] however it looks like it will have to be initiated via fax or phone as the portal doesn't allow you to initiate the authorization online. Everlene Balls is running an eligibility check for [employer] coverage, awaiting a response before insurance authorization can be initiated.   11:45am - HF CSW reached out to the patient's daughter, [name] [phone] however she didn't answer the phone and CSW left a voicemail for her to return the call. 12:43pm - HF CSW spoke with [name] and she wants to wait to see about the [facility] coverage as it stands right now they do not want to go with [facility] and want to wait to see if Mcarth[facility] Rossetti is active to get a better bed offer for rehab.  CSW notified MD and treatment team of the concern with the insurance and SNF placement and lack of bed offers.  CSW will continue to follow throughout discharge.   Expected Discharge Plan: [place] Barriers to Discharge: Continued Medical Work up  Expected Discharge Plan and Services Expected Discharge Plan: [place] In-house Referral: Clinical Social Work Discharge Planning Services: CM Consult Post Acute Care Choice: [place] arrangements for the past 2 months: Apartment Expected  Discharge Date: 01/06/22                 DME Agency: [employer] Date DME Agency Contacted: 12/30/21 Time DME Agency Contacted: 1600 Representative spoke with at DME Agency: [place] ([place]) Interventions    Readmission Risk Interventions Readmission Risk Prevention Plan 09/10/2020  Post Dischage Appt Complete  Medication Screening Complete  Transportation Screening Complete  Some recent data might be hidden   Latarra Eagleton, MSW, LCSW [phone] Heart Failure Social Worker

## 2022-01-11 NOTE — Progress Notes (Signed)
Mobility Specialist Progress Note:   01/11/22 1616  Mobility  Bed Position Chair  Activity Ambulated with assistance in hallway  Level of Assistance Modified independent, requires aide device or extra time  Assistive Device Four wheel walker  Distance Ambulated (ft) 480 ft  Activity Response Tolerated well  $Mobility charge 1 Mobility   Pt received on BSC willing to participate in mobility. No complaints of pain and asymptomatic. Pt left in chair with call bell in reach and all needs met.   [facility] Public librarian Phone [phone] Secondary Phone [phone]

## 2022-01-11 NOTE — Telephone Encounter (Signed)
The patient has been notified of the result and Left detailed message on voicemail and informed patient to call back. Marolyn Hammock, [place] 06/12/5788 7:84 PM  Precert tit

## 2022-01-12 ENCOUNTER — Encounter (HOSPITAL_COMMUNITY): Payer: No Typology Code available for payment source

## 2022-01-12 LAB — CBC
HCT: 51.5 % — ABNORMAL HIGH (ref 36.0–46.0)
Hemoglobin: 16.4 g/dL — ABNORMAL HIGH (ref 12.0–15.0)
MCH: 32.6 pg (ref 26.0–34.0)
MCHC: 31.8 g/dL (ref 30.0–36.0)
MCV: 102.4 fL — ABNORMAL HIGH (ref 80.0–100.0)
Platelets: 197 10*3/uL (ref 150–400)
RBC: 5.03 MIL/uL (ref 3.87–5.11)
RDW: 15.9 % — ABNORMAL HIGH (ref 11.5–15.5)
WBC: 6.4 10*3/uL (ref 4.0–10.5)
nRBC: 0 % (ref 0.0–0.2)

## 2022-01-12 LAB — SARS CORONAVIRUS 2 (TAT 6-24 HRS): SARS Coronavirus 2: NEGATIVE

## 2022-01-12 LAB — GLUCOSE, CAPILLARY
Glucose-Capillary: 80 mg/dL (ref 70–99)
Glucose-Capillary: 125 mg/dL — ABNORMAL HIGH (ref 70–99)
Glucose-Capillary: 103 mg/dL — ABNORMAL HIGH (ref 70–99)
Glucose-Capillary: 129 mg/dL — ABNORMAL HIGH (ref 70–99)

## 2022-01-12 LAB — BASIC METABOLIC PANEL
Anion gap: 10 (ref 5–15)
BUN: 38 mg/dL — ABNORMAL HIGH (ref 8–23)
CO2: 28 mmol/L (ref 22–32)
Calcium: 9.3 mg/dL (ref 8.9–10.3)
Chloride: 98 mmol/L (ref 98–111)
Creatinine, Ser: 1.37 mg/dL — ABNORMAL HIGH (ref 0.44–1.00)
GFR, Estimated: 39 mL/min — ABNORMAL LOW (ref >60)
Glucose, Bld: 82 mg/dL (ref 70–99)
Potassium: 4.3 mmol/L (ref 3.5–5.1)
Sodium: 136 mmol/L (ref 135–145)

## 2022-01-12 NOTE — Assessment & Plan Note (Signed)
Acute on chronic -musculoskeletal; no history of injury or fall -Lumbar spine and sacrum/coccyx x-rays on 01/03/2022 showed no fractures; showed first-degree anterolisthesis at L4-L5 level along with disc space narrowing. -Continue Voltaren gel, Robaxin, tramadol and lidocaine patch. -Continue physical therapy.

## 2022-01-12 NOTE — Progress Notes (Signed)
Physical Therapy Treatment Patient Details Name: Elizabeth Melton MRN: [id number] DOB: 1941/02/17 Today's Date: 01/12/2022   History of Present Illness 81 y.o. female was admitted 1/18 after recent admits with SOB and was hypoxic on home O2 of 5L.  Has new CPAP orders.  Pt is in CHF with diuresing and using purwick with significant low baseline sats 87-89%.  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 She required min guard assist transfers and ambulation with rollator. Pt on 5L O2 at rest. Mobilized on 6L with SpO2 90%. Pt incontinent of bowel in hallway after ambulating 50'. Seated rest break on rollator to assist with cleaning up prior to return to room. Assisted to bathroom upon return to room for further BM on toilet. Further assisted with toilet hygiene and bathing prior to return to recliner. Recommending SNF at d/c. If family able to provide needed level of assist, pt would be appropriate for d/c home with HHPT.     Recommendations for follow up therapy are one component of a multi-disciplinary discharge planning process, led by the attending physician.  Recommendations may be updated based on patient status, additional functional criteria and insurance authorization.  Follow Up Recommendations  Skilled nursing-short term rehab (<3 hours/day)     Assistance Recommended at Discharge Frequent or constant Supervision/Assistance  Patient can return home with the following A little help with walking and/or transfers;A little help with bathing/dressing/bathroom;Assistance with cooking/housework;Help with stairs or ramp for entrance;Assist for transportation   Equipment Recommendations  None recommended by PT    Recommendations for Other Services       Precautions / Restrictions Precautions Precautions: Fall     Mobility   Bed Mobility               General bed mobility comments: in recliner on arrival    Transfers Overall transfer level: Needs assistance Equipment used: Ambulation equipment used Transfers: Sit to/from Stand, Bed to chair/wheelchair/BSC Sit to Stand: Min guard   Step pivot transfers: Min guard            Ambulation/Gait Ambulation/Gait assistance: Min guard Gait Distance (Feet): 50 Feet (x 2) Assistive device: Rollator (4 wheels) Gait Pattern/deviations: Step-through pattern   Gait velocity interpretation: 1.31 - 2.62 ft/sec, indicative of limited community ambulator   General Gait Details: Mobilized on 6L with SpO2 90%. Gait distance limited by incontinence of bowel in hallway.   Stairs             Wheelchair Mobility    Modified Rankin (Stroke Patients Only)       Balance Overall balance assessment: Needs assistance Sitting-balance support: Feet supported, No upper extremity supported Sitting balance-Leahy Scale: Good     Standing balance support: Bilateral upper extremity supported, During functional activity, No upper extremity supported Standing balance-Leahy Scale: Fair                              Cognition Arousal/Alertness: Awake/alert Behavior During Therapy: WFL for tasks assessed/performed Overall Cognitive Status: Within Functional Limits for tasks assessed  Exercises      General Comments General comments (skin integrity, edema, etc.): 5L at rest. Mobilized on 6L.      Pertinent Vitals/Pain Pain Assessment Pain Assessment: No/denies pain    Home Living                          Prior Function            PT Goals (current goals can now be found in the care plan section) Acute Rehab PT Goals Patient Stated Goal: to go home with family assistance Progress towards PT goals: Progressing toward goals    Frequency    Min 3X/week      PT  Plan Current plan remains appropriate    Co-evaluation              AM-PAC PT "6 Clicks" Mobility   Outcome Measure  Help needed turning from your back to your side while in a flat bed without using bedrails?: A Little Help needed moving from lying on your back to sitting on the side of a flat bed without using bedrails?: A Little Help needed moving to and from a bed to a chair (including a wheelchair)?: A Little Help needed standing up from a chair using your arms (e.g., wheelchair or bedside chair)?: A Little Help needed to walk in hospital room?: A Little Help needed climbing 3-5 steps with a railing? : A Little 6 Click Score: 18    End of Session Equipment Utilized During Treatment: Oxygen;Gait belt Activity Tolerance: Patient tolerated treatment well Patient left: in chair;with call bell/phone within reach Nurse Communication: Mobility status PT Visit Diagnosis: Unsteadiness on feet (R26.81);Muscle weakness (generalized) (M62.81);Difficulty in walking, not elsewhere classified (R26.2)     Time: 8520-7409 PT Time Calculation (min) (ACUTE ONLY): 28 min  Charges:  $Gait Training: 8-22 mins $Therapeutic Activity: 8-22 mins                     Lorrin Goodell, PT  Office # [phone] Pager [phone]    Lorriane Shire 01/12/2022, 10:06 AM

## 2022-01-12 NOTE — TOC CM/SW Note (Signed)
HF TOC CM faxed new [employer] insurance card to [place]. They will change her oxygen regulator at home for 10 L. Faxed new insurance card to [employer]. [place], Heart Failure TOC CM [phone]

## 2022-01-12 NOTE — TOC CM/SW Note (Signed)
SATURATION QUALIFICATIONS: (This note is used to comply with regulatory documentation for home oxygen)  Patient Saturations on Room Air at Rest = unable to tolerate  Patient Saturations on Room Air while Ambulating =81-87% unable to tolerate being off oxygen long periods  Patient Saturations on 6 Liters of oxygen while Ambulating = 90%  Please briefly explain why patient needs home oxygen: will need home oxygen concentrator that exceeds 5 L  [place], Heart Failure TOC CM [phone]

## 2022-01-12 NOTE — Assessment & Plan Note (Signed)
Interstitial lung disease --Outpatient follow-up with pulmonary.  Considering antifibrotic therapy as an outpatient.

## 2022-01-12 NOTE — Progress Notes (Signed)
PROGRESS NOTE    Elizabeth Melton  WHK:[id number] DOB: 1941-07-13 DOA: 12/28/2021 PCP: Kathyrn Lass, MD  Brief Narrative:81 year old female with history of COPD, interstitial lung disease, chronic respiratory failure with hypoxia on 4 to 5 L oxygen via nasal cannula at home, severe pulmonary hypertension, chronic diastolic CHF, diabetes mellitus type 2, hypertension, paroxysmal A. fib, history of pulmonary embolism on Eliquis, PAD presented with worsening shortness of breath.  EMS found her with oxygen saturations of 85% on 5 L, she was placed on BiPAP and transferred to the ED.  She had missed few doses of diuretics at home.  In ED, chest x-ray showed chronic findings; labs were unremarkable.  Pulmonary and cardiology were consulted.  She was started on IV Lasix with modest response.  Subsequently, pulmonary has signed off.  She has already been switched to oral torsemide by heart failure team.  PT recommended SNF placement.  Subjective: -Feels okay overall, has some back pain, improved on current meds including tramadol -Breathing stable  Assessment and Plan: * Acute on chronic respiratory failure with hypoxia ([place])- (present on admission) Acute on chronic diastolic heart failure Severe pulmonary artery hypertension -Recent right heart cath on 11/23/2021 showed normal filling pressures, severe PAH and with low cardiac output.  Plan was to add Tyvaso as an outpatient. -Echo in 11/202 showed EF of 60 to 65% with moderate TR, PASP of 85 mmHg -Missed few doses of diuretics at home -Heart failure team following.  Right heart catheterization showed severe ongoing pulmonary hypertension.  -Diuresed with IV Lasix this admission, clinically euvolemic now, transitioned to oral torsemide, continue metoprolol -Oxygen needs continue to fluctuate, now down to 5 L since yesterday -Was seen by pulmonary again, restarted on short course of steroids and doxycycline, now off -Overall prognosis remains poor,  palliative care team following, patient remains full code, requests full scope of treatment -Relatively stable, has been awaiting SNF, placement limited by O2 needs  Low back pain- (present on admission) Acute on chronic -musculoskeletal; no history of injury or fall -Lumbar spine and sacrum/coccyx x-rays on 01/03/2022 showed no fractures; showed first-degree anterolisthesis at L4-L5 level along with disc space narrowing. -Continue Voltaren gel, Robaxin, tramadol and lidocaine patch. -Continue physical therapy.   CKD (chronic kidney disease) Creatinine stable, continue current dose of torsemide   AF (paroxysmal atrial fibrillation) ([place])- (present on admission) -Stable, continue toprol-xl and eliquis   COPD with chronic bronchitis and emphysema ([place]) Interstitial lung disease --Outpatient follow-up with pulmonary.  Considering antifibrotic therapy as an outpatient.  Type 2 diabetes mellitus with hyperglycemia (HCC)- (present on admission) a1c in 10/2021: 7.6 Continue lantus 10 units daily and moderate SSI accuchecks per protocol   Hyperkalemia- (present on admission) Resolved  Tobacco abuse- (present on admission) Continue nicotine patch   Essential (primary) hypertension- (present on admission) Blood pressure mildly elevated Continue home medication: toprol-xl 12.11m daily   Hyperlipidemia/PAD- (present on admission) Continue mevacor  Hypothyroidism- (present on admission) Last TSH 05/2021 and wnl Continue home synthroid   DVT prophylaxis: Apixaban Code Status: Full code Family Communication: Discussed patient in detail, no family at bedside Disposition Plan: Stable awaiting SNF   Consultants:  Cardiology, pulmonary  Procedures:   Antimicrobials:    Objective: Vitals:   01/12/22 0759 01/12/22 0841 01/12/22 0900 01/12/22 1101  BP:  (!) 131/47  (!) 137/50  Pulse:  62 71 61  Resp:  _0 Temp:  97.6 F (36.4 C)  98.3 F (36.8 C)  TempSrc:  Oral  Oral  SpO2: 97% 91% 94%   Weight:      Height:        Intake/Output Summary (Last 24 hours) at 01/12/2022 1231 Last data filed at 01/12/2022 0600 Gross per 24 hour  Intake 838 ml  Output 1000 ml  Net -162 ml   Filed Weights   01/10/22 0456 01/11/22 0455 01/12/22 0344  Weight: 73.1 kg 72 kg 71.9 kg    Examination:  General exam: Chronically ill pleasant female sitting up in bed, AAOx3, no distress HEENT: No JVD CVS: S1-S2, regular rate rhythm Lungs: Clear bilaterally Abdomen: Soft, nontender, bowel sounds present Extremities: No edema  Skin: No rashes Psychiatry:  Mood & affect appropriate.     Data Reviewed:   CBC: Recent Labs  Lab 01/06/22 0751 01/11/22 0449 01/12/22 0259  WBC  --  8.8 6.4  NEUTROABS  --  5.9  --   HGB 17.0*   17.0* 15.9* 16.4*  HCT 50.0*   50.0* 49.1* 51.5*  MCV  --  101.4* 102.4*  PLT  --  195 373   Basic Metabolic Panel: Recent Labs  Lab 01/07/22 0320 01/08/22 0233 01/09/22 0451 01/10/22 0507 01/11/22 0449 01/12/22 0259  NA 135 135 135 135 138 136  K 4.1 3.9 3.6 4.2 4.2 4.3  CL 99 101 99 102 101 98  CO2 _0 GLUCOSE 102* 60* 101* 86 104* 82  BUN 40* 52* 47* 47* 42* 38*  CREATININE 1.34* 1.32* 1.21* 1.43* 1.23* 1.37*  CALCIUM 9.1 8.8* 9.1 9.1 9.4 9.3  MG 2.6* 2.4 2.5* 2.4 2.5*  --    GFR: Estimated Creatinine Clearance: 33.8 mL/min (A) (by C-G formula based on SCr of 1.37 mg/dL (H)). Liver Function Tests: No results for input(s): AST, ALT, ALKPHOS, BILITOT, PROT, ALBUMIN in the last 168 hours. No results for input(s): LIPASE, AMYLASE in the last 168 hours. No results for input(s): AMMONIA in the last 168 hours. Coagulation Profile: No results for input(s): INR, PROTIME in the last 168 hours. Cardiac Enzymes: No results for input(s): CKTOTAL, CKMB, CKMBINDEX, TROPONINI in the last 168 hours. BNP (last 3 results) No results for input(s): PROBNP in the last 8760 hours. HbA1C: No results for input(s): HGBA1C in  the last 72 hours. CBG: Recent Labs  Lab 01/11/22 1102 01/11/22 1627 01/11/22 2120 01/12/22 0557 01/12/22 1059  GLUCAP 94 103* 86 80 125*   Lipid Profile: No results for input(s): CHOL, HDL, LDLCALC, TRIG, CHOLHDL, LDLDIRECT in the last 72 hours. Thyroid Function Tests: No results for input(s): TSH, T4TOTAL, FREET4, T3FREE, THYROIDAB in the last 72 hours. Anemia Panel: No results for input(s): VITAMINB12, FOLATE, FERRITIN, TIBC, IRON, RETICCTPCT in the last 72 hours. Urine analysis:    Component Value Date/Time   COLORURINE YELLOW 12/28/2021 1510   APPEARANCEUR CLEAR 12/28/2021 1510   APPEARANCEUR Clear 09/20/2021 1449   LABSPEC 1.010 12/28/2021 1510   PHURINE 6.0 12/28/2021 1510   GLUCOSEU NEGATIVE 12/28/2021 1510   HGBUR NEGATIVE 12/28/2021 1510   BILIRUBINUR NEGATIVE 12/28/2021 1510   BILIRUBINUR Negative 09/20/2021 [place] 12/28/2021 1510   PROTEINUR NEGATIVE 12/28/2021 1510   NITRITE NEGATIVE 12/28/2021 1510   LEUKOCYTESUR NEGATIVE 12/28/2021 1510   Sepsis Labs: _1 (procalcitonin:4,lacticidven:4)  ) Recent Results (from the past 240 hour(s))  Expectorated Sputum Assessment w Gram Stain, Rflx to Resp Cult     Status: None   Collection Time: 01/05/22 11:18 AM   Specimen: Expectorated Sputum  Result Value Ref  Range Status   Specimen Description EXPECTORATED SPUTUM  Final   Special Requests NONE  Final   Sputum evaluation   Final    THIS SPECIMEN IS ACCEPTABLE FOR SPUTUM CULTURE Performed at [facility], [address], [place], [place] [address]    Report Status 01/05/2022 FINAL  Final  Culture, Respiratory w Gram Stain     Status: None   Collection Time: 01/05/22 11:18 AM  Result Value Ref Range Status   Specimen Description EXPECTORATED SPUTUM  Final   Special Requests NONE Reflexed from [id number]  Final   Gram Stain   Final    RARE SQUAMOUS EPITHELIAL CELLS PRESENT FEW WBC PRESENT,BOTH PMN AND MONONUCLEAR MODERATE GRAM  POSITIVE COCCI FEW GRAM NEGATIVE RODS RARE YEAST RARE GRAM POSITIVE RODS    Culture   Final    ABUNDANT Normal respiratory flora-no Staph aureus or Pseudomonas seen Performed at [facility], [place] [address], [place], [place] [address]    Report Status 01/07/2022 FINAL  Final     Radiology Studies: No results found.   Scheduled Meds:  apixaban  5 mg Oral BID   arformoterol  15 mcg Nebulization BID   And   umeclidinium bromide  1 puff Inhalation Daily   budesonide (PULMICORT) nebulizer solution  0.25 mg Nebulization BID   buPROPion  150 mg Oral Daily   diclofenac Sodium  2 g Topical TID   doxycycline  100 mg Oral Q12H   DULoxetine  30 mg Oral Daily   guaiFENesin  600 mg Oral BID   insulin aspart  0-15 Units Subcutaneous TID WC   insulin glargine-yfgn  10 Units Subcutaneous Daily   ipratropium-albuterol  3 mL Nebulization BID   levothyroxine  75 mcg Oral QAC breakfast   lidocaine  2 patch Transdermal QHS   metoprolol succinate  12.5 mg Oral Daily   nicotine  14 mg Transdermal Daily   pantoprazole  40 mg Oral Daily   polyethylene glycol  17 g Oral BID   pravastatin  40 mg Oral q1800   senna-docusate  2 tablet Oral BID   sodium chloride flush  3 mL Intravenous Q12H   sodium chloride flush  3 mL Intravenous Q12H   torsemide  40 mg Oral Daily   Continuous Infusions:  sodium chloride     sodium chloride       LOS: 14 days    Time spent: 86mn    PDomenic Polite MD [facility]   01/12/2022, 12:31 PM

## 2022-01-12 NOTE — Plan of Care (Signed)
Problem: Activity: Goal: Capacity to carry out activities will improve Outcome: Progressing   Problem: Clinical Measurements: Goal: Respiratory complications will improve Outcome: Progressing Goal: Cardiovascular complication will be avoided Outcome: Progressing   Problem: Activity: Goal: Risk for activity intolerance will decrease Outcome: Progressing   Problem: Pain Managment: Goal: General experience of comfort will improve Outcome: Progressing   Problem: Safety: Goal: Ability to remain free from injury will improve Outcome: Progressing

## 2022-01-12 NOTE — Progress Notes (Signed)
Mobility Specialist Progress Note:   01/12/22 1118  Mobility  Bed Position Chair  Activity Ambulated with assistance in hallway  Level of Assistance Modified independent, requires aide device or extra time  Assistive Device Four wheel walker  Distance Ambulated (ft) 480 ft  Activity Response Tolerated well  $Mobility charge 1 Mobility   Pt received in chair willing to participate in mobility. No complaints of pain and asymptomatic. Pt left in chair with call bell in reach and all needs met.   [facility] Public librarian Phone [phone] Secondary Phone [phone]

## 2022-01-12 NOTE — TOC Progression Note (Addendum)
Transition of Care [facility]) - Progression Note    Patient Details  Name: Elizabeth Melton MRN: [id number] Date of Birth: March 08, 1941  [facility]) CM/SW Contact  Wynonna Fitzhenry, LCSW Phone Number: 01/12/2022, 9:33 AM  Clinical Narrative:    HF CSW called [facility] B[facility] [phone] to check on the health insurance eligibility check that was done yesterday 01/11/22. Navi reported they are waiting to hear back from [facility] and suggested the CSW try calling them directly. CSW called [facility] [phone] and was informed coverage started on 01/11/22 and initiated the insurance authorization for [facility], SNF. Reference #:[id number] 10:02am - HF CSW reached out to [place] [place] again to follow up from the conversation yesterday however Olivia Mackie didn't answer the phone and CSW had to leave a voicemail for her to return the call regarding placement. CSW sent Olivia Mackie a secure text for follow up. 10:46am - Olivia Mackie at [facility] reported she cannot extend a bed offer at this time. 11:06am - HF CSW reached out to [facility] at [facility] to see if she still has a bed available for Friday. Nicki reported she will take a look in the hub and let the CSW know. 11:40am - HF CSW spoke with the patient's daughter [name] about the plan for her mom to discharge tomorrow and that the only SNF bed offers currently are [facility] and [facility] and [name] refuses both facilities. [name] would like the CSW to reach out [facility] and [facility] to see if they can offer a bed for rehab. CSW informed [name] that the CSW is waiting to hear back from [facility]. [name] reported that she will agree to a 3 star facility for rehab but nothing lower than that and preferably in [place]. HF CSW has already outreached those facilities and hasn't had luck with any bed offers. 11:52am - HF CSW reached out to [facility] [place] and she reported that she doesn't have any beds available at this time. 11:53 - 11:54am - HF CSW  reached out to [place] [facility] [place] both her cell [phone] and the office [phone] and got no answer and the CSW had to leave a voicemail for [place] to return the call. 1:37pm - HF CSW received a call back from [facility] and she reported to not have any beds available at this time. 12:22pm - HF CSW [facility] [phone] regarding bed availability however the admission's coordinator was at lunch and the CSW had to leave a voicemail for them to return the call. 1:06pm - HF CSW received a call back from [facility] and they reported they aren't in network with [facility] unless it is a state plan and the admission's coordinator completed an eligibility check and found her insurance not to be in network with the facility. 12:25pm - HF CSW outreached [facility] [phone] to see about bed availability and the CSW had to leave a voicemail for them to return the call. 2:20pm - HF CSW called [facility] again and spoke with [name] for her to take a look at Ms. Glinski's clinicals in the hub and she will let the CSW know if she can extend a bed offer. 12:37pm - HF CSW heard back from [facility] and they reported they cannot offer a bed because of their 02 concentrators only go to 4L. CSW notified the patient's daughter [name] about the lack of bed offers. [name] asked for the CSW to reach out to [facility] [place] and [facility].  2:29pm - HF CSW called [facility]  and spoke with Soy who reported she will take a look in the hub and let the CSW know. 2:32pm - HF CSW reached out to [facility] and had to leave a message for Manuela Schwartz for her to return the call.  No other bed offers than [facility] and [facility] and the family is refusing these facilities. MD plan for discharge tomorrow and Ms. Gioffre will have to return home with home health. HF CSW notified HF RNCM about setting up home health.  CSW will continue to follow throughout discharge.   Expected Discharge Plan: Skilled Nursing Facility Barriers  to Discharge: Continued Medical Work up  Expected Discharge Plan and Services Expected Discharge Plan: [place] In-house Referral: Clinical Social Work Discharge Planning Services: CM Consult Post Acute Care Choice: [place] arrangements for the past 2 months: Apartment Expected Discharge Date: 01/06/22                 DME Agency: [employer] Date DME Agency Contacted: 12/30/21 Time DME Agency Contacted: 1600 Representative spoke with at DME Agency: Bethanne Ginger             Social Determinants of Health ([place]) Interventions    Readmission Risk Interventions Readmission Risk Prevention Plan 09/10/2020  Post Dischage Appt Complete  Medication Screening Complete  Transportation Screening Complete  Some recent data might be hidden

## 2022-01-12 NOTE — TOC Progression Note (Addendum)
Transition of Care [facility]) - Progression Note    Patient Details  Name: DNASIA GAUNA MRN: [id number] Date of Birth: 1941/10/30  Transition of Care [facility]) CM/SW Contact  Shyrl Obi, LCSW Phone Number: 01/12/2022, 3:55 PM  Clinical Narrative:    HF CSW received a call from [name] at [facility] at 3:32pm and she reported that she can extend a bed offer for Ms. Takeshita. HF CSW received insurance authorization back #[id number] until 01/17/22 with an admitting date of tomorrow 01/13/22. HF CSW notified the patient's daughter, [name] about [facility] extending a bed offer.  MD and treatment team made aware of the plan to DC tomorrow to [facility] and Covid test requested. 4:30pm - HF CSW spoke to Ms. Deerman at bedside about the plan for her to discharge tomorrow to [facility] and the insurance authorization was approved. Ms. Kugel is agreeable and pleased to be getting discharged from the hospital.  CSW will continue to follow throughout discharge.   Expected Discharge Plan: [place] Barriers to Discharge: Continued Medical Work up  Expected Discharge Plan and Services Expected Discharge Plan: [place] In-house Referral: Clinical Social Work Discharge Planning Services: CM Consult Post Acute Care Choice: [place] arrangements for the past 2 months: Apartment Expected Discharge Date: 01/06/22                 DME Agency: [employer] Date DME Agency Contacted: 12/30/21 Time DME Agency Contacted: 1600 Representative spoke with at DME Agency: [place] ([place]) Interventions    Readmission Risk Interventions Readmission Risk Prevention Plan 09/10/2020  Post Dischage Appt Complete  Medication Screening Complete  Transportation Screening Complete  Some recent data might be hidden   Aviana Shevlin, MSW, LCSW [phone] Heart Failure Social Worker

## 2022-01-13 DIAGNOSIS — Z79899 Other long term (current) drug therapy: Secondary | ICD-10-CM | POA: Diagnosis not present

## 2022-01-13 DIAGNOSIS — R0602 Shortness of breath: Secondary | ICD-10-CM | POA: Diagnosis not present

## 2022-01-13 DIAGNOSIS — N179 Acute kidney failure, unspecified: Secondary | ICD-10-CM | POA: Diagnosis not present

## 2022-01-13 DIAGNOSIS — R531 Weakness: Secondary | ICD-10-CM | POA: Diagnosis not present

## 2022-01-13 DIAGNOSIS — Z888 Allergy status to other drugs, medicaments and biological substances status: Secondary | ICD-10-CM | POA: Diagnosis not present

## 2022-01-13 DIAGNOSIS — M25552 Pain in left hip: Secondary | ICD-10-CM | POA: Diagnosis not present

## 2022-01-13 DIAGNOSIS — Z8616 Personal history of COVID-19: Secondary | ICD-10-CM | POA: Diagnosis not present

## 2022-01-13 DIAGNOSIS — G4733 Obstructive sleep apnea (adult) (pediatric): Secondary | ICD-10-CM | POA: Diagnosis not present

## 2022-01-13 DIAGNOSIS — R739 Hyperglycemia, unspecified: Secondary | ICD-10-CM | POA: Diagnosis not present

## 2022-01-13 DIAGNOSIS — I352 Nonrheumatic aortic (valve) stenosis with insufficiency: Secondary | ICD-10-CM | POA: Diagnosis not present

## 2022-01-13 DIAGNOSIS — E559 Vitamin D deficiency, unspecified: Secondary | ICD-10-CM | POA: Diagnosis not present

## 2022-01-13 DIAGNOSIS — R7309 Other abnormal glucose: Secondary | ICD-10-CM | POA: Diagnosis not present

## 2022-01-13 DIAGNOSIS — R0603 Acute respiratory distress: Secondary | ICD-10-CM | POA: Diagnosis not present

## 2022-01-13 DIAGNOSIS — I480 Paroxysmal atrial fibrillation: Secondary | ICD-10-CM | POA: Diagnosis not present

## 2022-01-13 DIAGNOSIS — I110 Hypertensive heart disease with heart failure: Secondary | ICD-10-CM | POA: Diagnosis not present

## 2022-01-13 DIAGNOSIS — R0902 Hypoxemia: Secondary | ICD-10-CM | POA: Diagnosis not present

## 2022-01-13 DIAGNOSIS — R040 Epistaxis: Secondary | ICD-10-CM | POA: Diagnosis not present

## 2022-01-13 DIAGNOSIS — Z86711 Personal history of pulmonary embolism: Secondary | ICD-10-CM | POA: Diagnosis not present

## 2022-01-13 DIAGNOSIS — I50811 Acute right heart failure: Secondary | ICD-10-CM | POA: Diagnosis not present

## 2022-01-13 DIAGNOSIS — J9620 Acute and chronic respiratory failure, unspecified whether with hypoxia or hypercapnia: Secondary | ICD-10-CM | POA: Diagnosis not present

## 2022-01-13 DIAGNOSIS — Z8701 Personal history of pneumonia (recurrent): Secondary | ICD-10-CM | POA: Diagnosis not present

## 2022-01-13 DIAGNOSIS — Z7401 Bed confinement status: Secondary | ICD-10-CM | POA: Diagnosis not present

## 2022-01-13 DIAGNOSIS — I4891 Unspecified atrial fibrillation: Secondary | ICD-10-CM | POA: Diagnosis not present

## 2022-01-13 DIAGNOSIS — F17210 Nicotine dependence, cigarettes, uncomplicated: Secondary | ICD-10-CM | POA: Diagnosis present

## 2022-01-13 DIAGNOSIS — I272 Pulmonary hypertension, unspecified: Secondary | ICD-10-CM | POA: Diagnosis not present

## 2022-01-13 DIAGNOSIS — F17200 Nicotine dependence, unspecified, uncomplicated: Secondary | ICD-10-CM | POA: Diagnosis not present

## 2022-01-13 DIAGNOSIS — Z7901 Long term (current) use of anticoagulants: Secondary | ICD-10-CM | POA: Diagnosis not present

## 2022-01-13 DIAGNOSIS — K219 Gastro-esophageal reflux disease without esophagitis: Secondary | ICD-10-CM | POA: Diagnosis not present

## 2022-01-13 DIAGNOSIS — J449 Chronic obstructive pulmonary disease, unspecified: Secondary | ICD-10-CM | POA: Diagnosis not present

## 2022-01-13 DIAGNOSIS — J84112 Idiopathic pulmonary fibrosis: Secondary | ICD-10-CM | POA: Diagnosis not present

## 2022-01-13 DIAGNOSIS — J80 Acute respiratory distress syndrome: Secondary | ICD-10-CM | POA: Diagnosis not present

## 2022-01-13 DIAGNOSIS — I2722 Pulmonary hypertension due to left heart disease: Secondary | ICD-10-CM | POA: Diagnosis not present

## 2022-01-13 DIAGNOSIS — R0689 Other abnormalities of breathing: Secondary | ICD-10-CM | POA: Diagnosis not present

## 2022-01-13 DIAGNOSIS — Z9981 Dependence on supplemental oxygen: Secondary | ICD-10-CM | POA: Diagnosis not present

## 2022-01-13 DIAGNOSIS — Z841 Family history of disorders of kidney and ureter: Secondary | ICD-10-CM | POA: Diagnosis not present

## 2022-01-13 DIAGNOSIS — R069 Unspecified abnormalities of breathing: Secondary | ICD-10-CM | POA: Diagnosis not present

## 2022-01-13 DIAGNOSIS — I959 Hypotension, unspecified: Secondary | ICD-10-CM | POA: Diagnosis not present

## 2022-01-13 DIAGNOSIS — J9610 Chronic respiratory failure, unspecified whether with hypoxia or hypercapnia: Secondary | ICD-10-CM | POA: Diagnosis not present

## 2022-01-13 DIAGNOSIS — J9600 Acute respiratory failure, unspecified whether with hypoxia or hypercapnia: Secondary | ICD-10-CM | POA: Diagnosis not present

## 2022-01-13 DIAGNOSIS — Z20822 Contact with and (suspected) exposure to covid-19: Secondary | ICD-10-CM | POA: Diagnosis not present

## 2022-01-13 DIAGNOSIS — M6281 Muscle weakness (generalized): Secondary | ICD-10-CM | POA: Diagnosis not present

## 2022-01-13 DIAGNOSIS — I130 Hypertensive heart and chronic kidney disease with heart failure and stage 1 through stage 4 chronic kidney disease, or unspecified chronic kidney disease: Secondary | ICD-10-CM | POA: Diagnosis not present

## 2022-01-13 DIAGNOSIS — M545 Low back pain, unspecified: Secondary | ICD-10-CM | POA: Diagnosis not present

## 2022-01-13 DIAGNOSIS — E039 Hypothyroidism, unspecified: Secondary | ICD-10-CM | POA: Diagnosis not present

## 2022-01-13 DIAGNOSIS — I5032 Chronic diastolic (congestive) heart failure: Secondary | ICD-10-CM | POA: Diagnosis not present

## 2022-01-13 DIAGNOSIS — I2721 Secondary pulmonary arterial hypertension: Secondary | ICD-10-CM | POA: Diagnosis not present

## 2022-01-13 DIAGNOSIS — N1832 Chronic kidney disease, stage 3b: Secondary | ICD-10-CM | POA: Diagnosis present

## 2022-01-13 DIAGNOSIS — I5033 Acute on chronic diastolic (congestive) heart failure: Secondary | ICD-10-CM | POA: Diagnosis not present

## 2022-01-13 DIAGNOSIS — J439 Emphysema, unspecified: Secondary | ICD-10-CM | POA: Diagnosis not present

## 2022-01-13 DIAGNOSIS — R079 Chest pain, unspecified: Secondary | ICD-10-CM | POA: Diagnosis not present

## 2022-01-13 DIAGNOSIS — N189 Chronic kidney disease, unspecified: Secondary | ICD-10-CM | POA: Diagnosis not present

## 2022-01-13 DIAGNOSIS — Z515 Encounter for palliative care: Secondary | ICD-10-CM | POA: Diagnosis not present

## 2022-01-13 DIAGNOSIS — E785 Hyperlipidemia, unspecified: Secondary | ICD-10-CM | POA: Diagnosis present

## 2022-01-13 DIAGNOSIS — R0600 Dyspnea, unspecified: Secondary | ICD-10-CM | POA: Diagnosis not present

## 2022-01-13 DIAGNOSIS — M549 Dorsalgia, unspecified: Secondary | ICD-10-CM | POA: Diagnosis not present

## 2022-01-13 DIAGNOSIS — J9621 Acute and chronic respiratory failure with hypoxia: Secondary | ICD-10-CM | POA: Diagnosis not present

## 2022-01-13 DIAGNOSIS — I10 Essential (primary) hypertension: Secondary | ICD-10-CM | POA: Diagnosis not present

## 2022-01-13 DIAGNOSIS — J9611 Chronic respiratory failure with hypoxia: Secondary | ICD-10-CM | POA: Diagnosis not present

## 2022-01-13 DIAGNOSIS — I509 Heart failure, unspecified: Secondary | ICD-10-CM | POA: Diagnosis not present

## 2022-01-13 DIAGNOSIS — Z833 Family history of diabetes mellitus: Secondary | ICD-10-CM | POA: Diagnosis not present

## 2022-01-13 DIAGNOSIS — J849 Interstitial pulmonary disease, unspecified: Secondary | ICD-10-CM | POA: Diagnosis not present

## 2022-01-13 DIAGNOSIS — Z87891 Personal history of nicotine dependence: Secondary | ICD-10-CM | POA: Diagnosis not present

## 2022-01-13 DIAGNOSIS — E1122 Type 2 diabetes mellitus with diabetic chronic kidney disease: Secondary | ICD-10-CM | POA: Diagnosis present

## 2022-01-13 DIAGNOSIS — E118 Type 2 diabetes mellitus with unspecified complications: Secondary | ICD-10-CM | POA: Diagnosis not present

## 2022-01-13 DIAGNOSIS — I2510 Atherosclerotic heart disease of native coronary artery without angina pectoris: Secondary | ICD-10-CM | POA: Diagnosis not present

## 2022-01-13 DIAGNOSIS — D696 Thrombocytopenia, unspecified: Secondary | ICD-10-CM | POA: Diagnosis not present

## 2022-01-13 DIAGNOSIS — Z7189 Other specified counseling: Secondary | ICD-10-CM | POA: Diagnosis not present

## 2022-01-13 DIAGNOSIS — J441 Chronic obstructive pulmonary disease with (acute) exacerbation: Secondary | ICD-10-CM | POA: Diagnosis not present

## 2022-01-13 DIAGNOSIS — R7989 Other specified abnormal findings of blood chemistry: Secondary | ICD-10-CM | POA: Diagnosis present

## 2022-01-13 DIAGNOSIS — J8410 Pulmonary fibrosis, unspecified: Secondary | ICD-10-CM | POA: Diagnosis not present

## 2022-01-13 DIAGNOSIS — R0989 Other specified symptoms and signs involving the circulatory and respiratory systems: Secondary | ICD-10-CM | POA: Diagnosis not present

## 2022-01-13 LAB — BASIC METABOLIC PANEL
Anion gap: 11 (ref 5–15)
BUN: 46 mg/dL — ABNORMAL HIGH (ref 8–23)
CO2: 25 mmol/L (ref 22–32)
Calcium: 9.2 mg/dL (ref 8.9–10.3)
Chloride: 99 mmol/L (ref 98–111)
Creatinine, Ser: 1.27 mg/dL — ABNORMAL HIGH (ref 0.44–1.00)
GFR, Estimated: 43 mL/min — ABNORMAL LOW (ref >60)
Glucose, Bld: 98 mg/dL (ref 70–99)
Potassium: 4.1 mmol/L (ref 3.5–5.1)
Sodium: 135 mmol/L (ref 135–145)

## 2022-01-13 LAB — GLUCOSE, CAPILLARY
Glucose-Capillary: 91 mg/dL (ref 70–99)
Glucose-Capillary: 105 mg/dL — ABNORMAL HIGH (ref 70–99)

## 2022-01-13 MED ORDER — ALBUTEROL SULFATE (2.5 MG/3ML) 0.083% IN NEBU: 2.5000 mg | INHALATION_SOLUTION | RESPIRATORY_TRACT | 0 refills | Status: DC | PRN

## 2022-01-13 MED ORDER — TORSEMIDE 40 MG PO TABS: 40.0000 | ORAL_TABLET | Freq: Every day | ORAL | Status: DC

## 2022-01-13 MED ORDER — METHOCARBAMOL 1000 MG PO TABS: 1000.0000 mg | ORAL_TABLET | Freq: Three times a day (TID) | ORAL | Status: DC | PRN

## 2022-01-13 MED ORDER — TRAMADOL HCL 50 MG PO TABS: 50.0000 mg | ORAL_TABLET | Freq: Four times a day (QID) | ORAL | 0 refills | Status: AC | PRN

## 2022-01-13 MED ORDER — SENNOSIDES-DOCUSATE SODIUM 8.6-50 MG PO TABS: 2.0000 | ORAL_TABLET | Freq: Every day | ORAL | Status: AC

## 2022-01-13 MED ORDER — PANTOPRAZOLE SODIUM 40 MG PO TBEC: 40.0000 mg | DELAYED_RELEASE_TABLET | Freq: Every day | ORAL | 0 refills | Status: AC

## 2022-01-13 MED ORDER — DULOXETINE HCL 30 MG PO CPEP: 30.0000 mg | ORAL_CAPSULE | Freq: Every day | ORAL | 0 refills | Status: AC

## 2022-01-13 MED ORDER — LIDOCAINE 5 % EX PTCH: 1.0000 | MEDICATED_PATCH | Freq: Every day | CUTANEOUS | 0 refills | Status: AC

## 2022-01-13 MED ORDER — GUAIFENESIN ER 600 MG PO TB12: 600.0000 mg | ORAL_TABLET | Freq: Two times a day (BID) | ORAL | Status: AC

## 2022-01-13 NOTE — TOC Progression Note (Addendum)
Transition of Care [facility]) - Progression Note    Patient Details  Name: Elizabeth Melton MRN: [id number] Date of Birth: [birth year]  Transition of Care [facility]) CM/SW Contact  Jearld Hemp, LCSW Phone Number: 01/13/2022, 9:48 AM  Clinical Narrative:    HF CSW reached out to [facility] to make sure that [name] still has a bed to offer [name] however she was in morning meeting and CSW had to leave a voicemail. The patient's daughter, [name] is okay with the CSW arranging transportation at time of discharge. COVID test negative. Insurance authorization approved #[id number] next date for review is 01/17/22.  CSW will continue to follow throughout discharge.   Expected Discharge Plan: [place] Barriers to Discharge: Continued Medical Work up  Expected Discharge Plan and Services Expected Discharge Plan: [place] In-house Referral: Clinical Social Work Discharge Planning Services: CM Consult Post Acute Care Choice: [place] arrangements for the past 2 months: Apartment Expected Discharge Date: 01/06/22                 DME Agency: [employer] Date DME Agency Contacted: 12/30/21 Time DME Agency Contacted: 1600 Representative spoke with at DME Agency: [place] ([place]) Interventions    Readmission Risk Interventions Readmission Risk Prevention Plan 09/10/2020  Post Dischage Appt Complete  Medication Screening Complete  Transportation Screening Complete  Some recent data might be hidden   Lillyann Ahart, MSW, LCSW [phone] Heart Failure Social Worker

## 2022-01-13 NOTE — Discharge Summary (Addendum)
Physician Discharge Summary  Elizabeth Melton BBC:[id number] DOB: 04-26-1941 DOA: 12/28/2021  PCP: Kathyrn Lass, MD  Admit date: 12/28/2021 Discharge date: 01/13/2022  Time spent: 45 minutes  Recommendations for Outpatient Follow-up:  PCP Dr. Sabra Heck 2/6 Heart failure clinic 2/9, please check BMP at follow-up Pulmonary Delice Lesch, 2/10 Recommend CPAP QHS-Autotirate with pressure range of 5-50mhg-when CPAP machine arrives  Discharge Diagnoses:  Principal Problem:   Acute on chronic respiratory failure with hypoxia (HCC) Severe pulmonary hypertension Interstitial lung disease COPD Acute on chronic diastolic CHF Acute on chronic low back pain   Low back pain   CKD (chronic kidney disease)   AF (paroxysmal atrial fibrillation) (HCC)   COPD with chronic bronchitis and emphysema (HCC)   Type 2 diabetes mellitus with hyperglycemia (HCC)   Hypothyroidism   Hyperlipidemia/PAD   Essential (primary) hypertension   PAD (peripheral artery disease) (HCC)   Tobacco abuse   Pulmonary HTN (HCC)   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 fib, history of pulmonary embolism on Eliquis, PAD presented with worsening shortness of breath.  EMS found her with oxygen saturations of 85% on 5 L, she was placed on BiPAP and transferred to the ED.  She had missed few doses of diuretics at home.  Hospital Course:   Acute on chronic respiratory failure  with hypoxia (HCC)- (present on admission) Acute on chronic diastolic heart failure Severe pulmonary artery hypertension -Admitted with worsening dyspnea and hypoxia, multifactorial -Recent right heart cath on 11/23/2021 showed normal filling pressures, severe PAH and with low cardiac output.  Plan was to add Tyvaso as an outpatient. -Echo in 11/202 showed EF of 60 to 65% with moderate TR, PASP of 85 mmHg -Seen by pulmonary and heart failure team this admission,  right heart catheterization showed severe ongoing pulmonary hypertension.  -Diuresed with IV Lasix this admission, clinically euvolemic now, transitioned to oral torsemide 40 Mg daily, continue metoprolol -Oxygen needs continue to fluctuate, now stable on 5 L -Was also treated with a brief course of steroids and doxycycline per pulmonary recommendation, now off -Overall prognosis remains poor, palliative care team was consulted, patient remains full code, requests full scope of treatment -She will discharge to SNF for short-term rehab -Will need close follow-up, has upcoming appointments with PCP, heart failure clinic and pulmonary   Low back pain- Acute on chronic -musculoskeletal; no history of injury or fall -Lumbar spine and sacrum/coccyx x-rays on 01/03/2022 showed no fractures; showed first-degree anterolisthesis at L4-L5 level along with disc space narrowing. -Continue Voltaren gel, Robaxin, tramadol and lidocaine patch. -Continue physical therapy. -Overall much improved    CKD (chronic kidney disease) Creatinine stable, continue current dose of torsemide    AF (paroxysmal atrial fibrillation) ([place]- (present on admission) -Stable, continue toprol-xl and eliquis    COPD with chronic bronchitis and emphysema (Elizabeth Melton Interstitial lung disease Chronic respiratory failure on 5 to 6 L O2 --Outpatient follow-up with pulmonary.  Considering antifibrotic therapy as an outpatient.  Type 2 diabetes mellitus with hyperglycemia  (HCC)- (present on admission) a1c in 10/2021: 7.6 Continue lantus 10 units daily and moderate SSI   Hyperkalemia- (present on admission) Resolved   Tobacco abuse- (present on admission) Continue nicotine patch    Essential (primary) hypertension- (present on admission) Blood pressure mildly elevated Continue home medication: toprol-xl 12.43m daily    Hyperlipidemia/PAD- (present on admission) Continue mevacor   Hypothyroidism- (present on admission) Last TSH 05/2021 and wnl Continue home synthroid  OSA -CPAP QHS added, autotitrate 5-278mg -she will need this started when machine arrives  Consultations: Pulmonary Heart failure team  Discharge Exam: Vitals:   01/13/22 0741 01/13/22 0944  BP:  (!) 142/53  Pulse:    Resp:    Temp:    SpO2: 100% 91%   General exam: Chronically ill pleasant female sitting up in bed, AAOx3, no distress HEENT: No JVD CVS: S1-S2, regular rate rhythm Lungs: improved air movement Abdomen: Soft, nontender, bowel sounds present Extremities: No edema  Skin: No rashes Psychiatry:  Mood & affect appropriate.   Discharge Instructions   Discharge Instructions     Diet - low sodium heart healthy   Complete by: As directed    Increase activity slowly   Complete by: As directed       Allergies as of 01/13/2022       Reactions   Lisinopril Other (See Comments)   Other reaction(s): cough 09/20/17        Medication List     TAKE these medications    acetaminophen 500 MG tablet Commonly known as: TYLENOL Take 1,000 mg by mouth every 8 (eight) hours as needed for headache or mild pain (pain).   albuterol (2.5 MG/3ML) 0.083% nebulizer solution Commonly known as: PROVENTIL Take 3 mLs (2.5 mg total) by nebulization every 4 (four) hours as needed for wheezing or shortness of breath.   apixaban 5 MG Tabs tablet Commonly known as: ELIQUIS Take 1 tablet (5 mg total) by mouth 2 (two) times daily.   b complex vitamins tablet Take 1  tablet by mouth daily.   bisacodyl 10 MG suppository Commonly known as: DULCOLAX Place 10 mg rectally daily as needed for moderate constipation.   buPROPion 150 MG 24 hr tablet Commonly known as: WELLBUTRIN XL Take 1 tablet (150 mg total) by mouth daily.   Calcium + D3 600-200 MG-UNIT Tabs Take 2 tablets by mouth daily.   diphenhydrAMINE 25 MG tablet Commonly known as: BENADRYL Take 25 mg by mouth every 6 (six) hours as needed for allergies.   DULoxetine 30 MG capsule Commonly known as: CYMBALTA Take 1 capsule (30 mg total) by mouth daily. Start taking on: January 14, 2022   Fish Oil 1000 MG Caps Take 1,000 mg by mouth daily.   glucose blood test strip 1 each by Other route as needed for other (blood sugar).   guaiFENesin 600 MG 12 hr tablet Commonly known as: MUCINEX Take 1 tablet (600 mg total) by mouth 2 (two) times daily.   insulin glargine 100 UNIT/ML injection Commonly known as: LANTUS Inject 10 Units into the skin daily.   levothyroxine 75 MCG tablet Commonly known as: SYNTHROID Take 75 mcg by mouth daily before breakfast.   lidocaine 5 % Commonly known as: LIDODERM Place 1 patch onto the skin at bedtime. Remove & Discard patch within 12 hours or as directed by MD   lovastatin 40 MG tablet Commonly known as: MEVACOR Take 40 mg by mouth at bedtime.   Methocarbamol  1000 MG Tabs Take 1,000 mg by mouth every 8 (eight) hours as needed for muscle spasms.   metoprolol succinate 25 MG 24 hr tablet Commonly known as: TOPROL-XL Take 0.5 tablets (12.5 mg total) by mouth daily.   nicotine 14 mg/24hr patch Commonly known as: NICODERM CQ - dosed in mg/24 hours Place 14 mg onto the skin daily.   OXYGEN Inhale 5 L into the lungs continuous.   pantoprazole 40 MG tablet Commonly known as: PROTONIX Take 1 tablet (40 mg total) by mouth daily. Start taking on: January 14, 2022   PEN NEEDLES 29GX1/2" 29G X 12MM Misc For insulin injection   potassium chloride 10  MEQ tablet Commonly known as: KLOR-CON Take 2 tablets (20 mEq total) by mouth daily.   senna-docusate 8.6-50 MG tablet Commonly known as: Senokot-S Take 2 tablets by mouth at bedtime.   Smart Sense Thin Lancets 26G Misc 1 each by Does not apply route 2 (two) times daily.   Stiolto Respimat 2.5-2.5 MCG/ACT Aers Generic drug: Tiotropium Bromide-Olodaterol Inhale 2 puffs into the lungs daily.   Torsemide 40 MG Tabs Take 40 tablets by mouth daily. What changed:  how much to take when to take this additional instructions   traMADol 50 MG tablet Commonly known as: ULTRAM Take 1 tablet (50 mg total) by mouth every 6 (six) hours as needed for moderate pain.   True Metrix Meter w/Device Kit 1 each by Other route in the morning and at bedtime.   VITAMIN D PO Take 1,200 Units by mouth daily.               Durable Medical Equipment  (From admission, onward)           Start     Ordered   01/12/22 1508  For home use only DME 3 n 1  Once        01/12/22 1507   01/12/22 1502  For home use only DME oxygen  Once       Question Answer Comment  Length of Need Lifetime   Mode or (Route) Nasal cannula   Liters per Minute 6   Frequency Continuous (stationary and portable oxygen unit needed)   Oxygen delivery system Gas      01/12/22 1502           Allergies  Allergen Reactions   Lisinopril Other (See Comments)    Other reaction(s): cough 09/20/17    Follow-up Information     [facility]. Go to.   Specialty: Pulmonology Why: follow up with Marland Kitchen at 9:30 am on 2/10 Contact information: [place] [id number] [place] Follow up on 01/19/2022.   Specialty: Cardiology Why: Advanced Heart Failure Clinic at [facility], 11:30 am Entrance C, Garage Code 3151 Contact information: [address] [id number] [place] SWNIOEVOJJ [place]        Kathyrn Lass, MD. Go on 01/16/2022.   Specialty: Family Medicine Why: _0 :[place] Hidden information: [place] [address] [phone]         [name], MD .   Specialties: Cardiology, Radiology Contact information: [address] [place] [address] [phone]                  The results of significant diagnostics from this hospitalization (including imaging, microbiology, ancillary and laboratory) are  listed below for reference.    Significant Diagnostic Studies: DG Chest 2 View  Result Date: 01/05/2022 CLINICAL DATA:  Shortness of breath EXAM: CHEST - 2 VIEW COMPARISON:  Previous studies including the examination of 12/28/2021 FINDINGS: Transverse diameter of heart is increased. There is diffuse increase in interstitial markings in both lungs. There is interval improvement in aeration in the right mid and right lower lung fields. No new focal pulmonary consolidation is seen. Lateral CP angles are clear. There is no pneumothorax. IMPRESSION: Pulmonary fibrosis. Cardiomegaly. There is interval improvement in aeration in the right parahilar region and right lower lung fields suggesting resolving pulmonary edema or resolving pneumonia superimposed over pulmonary fibrosis. Electronically Signed   By: Elmer Picker M.D.   On: 01/05/2022 13:34   DG Lumbar Spine 2-3 Views  Result Date: 01/03/2022 CLINICAL DATA:  Back pain EXAM: LUMBAR SPINE - 2-3 VIEW COMPARISON:  None. FINDINGS: No recent fracture is seen. There is first-degree anterolisthesis at L4-L5 level. There is disc space narrowing at the L4-L5 level. Degenerative changes are noted in facet joints, more so at L4-L5 and L5-S1 levels. Extensive arterial calcifications are seen. Calcifications in the right upper quadrant suggest gallbladder stones. Fibrotic changes are noted in the visualized lower lung fields. IMPRESSION: No recent  fracture is seen. There is first-degree anterolisthesis at L4-L5 level along with disc space narrowing. Degenerative changes are noted in facet joints in the lower lumbar spine. Gallbladder stones. Pulmonary fibrosis.  Severe atherosclerotic disease. Electronically Signed   By: Elmer Picker M.D.   On: 01/03/2022 12:30   DG Sacrum/Coccyx  Result Date: 01/03/2022 CLINICAL DATA:  Low back pain EXAM: SACRUM AND COCCYX - 2+ VIEW COMPARISON:  None. FINDINGS: No recent fracture is seen. There is sclerosis adjacent to the SI joints, especially in the inferior aspect of right SI joint. Osteopenia is seen in bony structures. Vascular calcifications are seen. Coarse calcifications in the pelvis suggest possible calcified uterine fibroids. IMPRESSION: No recent fracture is seen. Osteopenia. Degenerative changes are noted in the SI joints, more so on the right side. Electronically Signed   By: Elmer Picker M.D.   On: 01/03/2022 12:32   CARDIAC CATHETERIZATION  Result Date: 01/06/2022 1. Optimized filling pressures. 2. Severe pulmonary arterial hypertension   DG CHEST PORT 1 VIEW  Result Date: 01/09/2022 CLINICAL DATA:  Hypoxia. EXAM: PORTABLE CHEST 1 VIEW COMPARISON:  01/05/2022 FINDINGS: Stable enlarged cardiac silhouette and tortuous and calcified thoracic aorta. No significant change in diffuse prominence of the interstitial markings without superimposed airspace opacity or pleural fluid. Unremarkable bones. IMPRESSION: Stable cardiomegaly and pulmonary fibrosis. No acute abnormality. Stable cardiomegaly and pulmonary fibrosis. No acute abnormality. Electronically Signed   By: Claudie Revering M.D.   On: 01/09/2022 14:22   DG Chest Port 1 View  Result Date: 12/28/2021 CLINICAL DATA:  Shortness of breath EXAM: PORTABLE CHEST 1 VIEW COMPARISON:  11/03/2021 FINDINGS: Unchanged cardiomegaly and aortic atherosclerosis. Redemonstrated extensive interstitial thickening, with possible additional airspace  opacities in the lower lungs difficult to exclude. No definite pleural effusion. No acute osseous abnormality. IMPRESSION: Redemonstrated diffuse interstitial thickening, consistent with patient's known pulmonary fibrosis, with additional superimposed airspace opacity difficult to exclude given degree of fibrosis. Electronically Signed   By: Merilyn Baba M.D.   On: 12/28/2021 12:36    Microbiology: Recent Results (from the past 240 hour(s))  Expectorated Sputum Assessment w Gram Stain, Rflx to Resp Cult     Status: None   Collection Time: 01/05/22 11:18 AM  Specimen: Expectorated Sputum  Result Value Ref Range Status   Specimen Description EXPECTORATED SPUTUM  Final   Special Requests NONE  Final   Sputum evaluation   Final    THIS SPECIMEN IS ACCEPTABLE FOR SPUTUM CULTURE Performed at [facility], [address], [place], [place] [address]    Report Status 01/05/2022 FINAL  Final  Culture, Respiratory w Gram Stain     Status: None   Collection Time: 01/05/22 11:18 AM  Result Value Ref Range Status   Specimen Description EXPECTORATED SPUTUM  Final   Special Requests NONE Reflexed from [id number]  Final   Gram Stain   Final    RARE SQUAMOUS EPITHELIAL CELLS PRESENT FEW WBC PRESENT,BOTH PMN AND MONONUCLEAR MODERATE GRAM POSITIVE COCCI FEW GRAM NEGATIVE RODS RARE YEAST RARE GRAM POSITIVE RODS    Culture   Final    ABUNDANT Normal respiratory flora-no Staph aureus or Pseudomonas seen Performed at [facility], [place] [address] [place], [place] [address]    Report Status 01/07/2022 FINAL  Final  SARS CORONAVIRUS 2 (TAT 6-24 HRS) Nasopharyngeal Nasopharyngeal Swab     Status: None   Collection Time: 01/12/22  5:05 PM   Specimen: Nasopharyngeal Swab  Result Value Ref Range Status   SARS Coronavirus 2 NEGATIVE NEGATIVE Final    Comment: (NOTE) SARS-CoV-2 target nucleic acids are NOT DETECTED.  The SARS-CoV-2 RNA is generally detectable in upper and lower respiratory  specimens during the acute phase of infection. Negative results do not preclude SARS-CoV-2 infection, do not rule out co-infections with other pathogens, and should not be used as the sole basis for treatment or other patient management decisions. Negative results must be combined with clinical observations, patient history, and epidemiological information. The expected result is Negative.  Fact Sheet for Patients: SugarRoll.be  Fact Sheet for Healthcare Providers: https://www.woods-mathews.com/  This test is not yet approved or cleared by the [place] FDA and  has been authorized for detection and/or diagnosis of SARS-CoV-2 by FDA under an Emergency Use Authorization (EUA). This EUA will remain  in effect (meaning this test can be used) for the duration of the COVID-19 declaration under Se ction 564(b)(1) of the Act, 21 U.S.C. section 360bbb-3(b)(1), unless the authorization is terminated or revoked sooner.  Performed at [facility], [place] [address] [place], [place] [address]      Labs: Basic Metabolic Panel: Recent Labs  Lab 01/07/22 0320 01/08/22 8280 01/09/22 0451 01/10/22 0507 01/11/22 0449 01/12/22 0259 01/13/22 0311  NA 135 135 135 135 138 136 135  K 4.1 3.9 3.6 4.2 4.2 4.3 4.1  CL 99 101 99 102 101 98 99  CO2 _0 GLUCOSE 102* 60* 101* 86 104* 82 98  BUN 40* 52* 47* 47* 42* 38* 46*  CREATININE 1.34* 1.32* 1.21* 1.43* 1.23* 1.37* 1.27*  CALCIUM 9.1 8.8* 9.1 9.1 9.4 9.3 9.2  MG 2.6* 2.4 2.5* 2.4 2.5*  --   --    Liver Function Tests: No results for input(s): AST, ALT, ALKPHOS, BILITOT, PROT, ALBUMIN in the last 168 hours. No results for input(s): LIPASE, AMYLASE in the last 168 hours. No results for input(s): AMMONIA in the last 168 hours. CBC: Recent Labs  Lab 01/11/22 0449 01/12/22 0259  WBC 8.8 6.4  NEUTROABS 5.9  --   HGB 15.9* 16.4*  HCT 49.1* 51.5*  MCV 101.4* 102.4*  PLT 195  197   Cardiac Enzymes: No results  for input(s): CKTOTAL, CKMB, CKMBINDEX, TROPONINI in the last 168 hours. BNP: BNP (last 3 results) Recent Labs    11/18/21 1610 12/28/21 1218 12/29/21 0331  BNP 241.9* 565.2* 821.0*    ProBNP (last 3 results) No results for input(s): PROBNP in the last 8760 hours.  CBG: Recent Labs  Lab 01/12/22 0557 01/12/22 1059 01/12/22 1721 01/12/22 2100 01/13/22 0555  GLUCAP 80 125* 103* 129* 91       Signed:  Domenic Polite MD.  [facility] 01/13/2022, 10:19 AM

## 2022-01-13 NOTE — Plan of Care (Signed)
Problem: Education: Goal: Ability to demonstrate management of disease process will improve Outcome: Progressing Goal: Ability to verbalize understanding of medication therapies will improve Outcome: Progressing Goal: Individualized Educational Video(s) Outcome: Progressing   Problem: Activity: Goal: Capacity to carry out activities will improve Outcome: Progressing   Problem: Cardiac: Goal: Ability to achieve and maintain adequate cardiopulmonary perfusion will improve Outcome: Progressing   Problem: Education: Goal: Knowledge of General Education information will improve Description: Including pain rating scale, medication(s)/side effects and non-pharmacologic comfort measures Outcome: Progressing   Problem: Health Behavior/Discharge Planning: Goal: Ability to manage health-related needs will improve Outcome: Progressing   Problem: Clinical Measurements: Goal: Ability to maintain clinical measurements within normal limits will improve Outcome: Progressing Goal: Diagnostic test results will improve Outcome: Progressing Goal: Respiratory complications will improve Outcome: Progressing Goal: Cardiovascular complication will be avoided Outcome: Progressing   Problem: Activity: Goal: Risk for activity intolerance will decrease Outcome: Progressing   Problem: Coping: Goal: Level of anxiety will decrease Outcome: Progressing   Problem: Elimination: Goal: Will not experience complications related to bowel motility Outcome: Progressing Goal: Will not experience complications related to urinary retention Outcome: Progressing   Problem: Pain Managment: Goal: General experience of comfort will improve Outcome: Progressing   Problem: Safety: Goal: Ability to remain free from injury will improve Outcome: Progressing   Problem: Skin Integrity: Goal: Risk for impaired skin integrity will decrease Outcome: Progressing

## 2022-01-13 NOTE — Care Management Important Message (Deleted)
Important Message  Patient Details  Name: Elizabeth Melton MRN: [id number] Date of Birth: 1941-03-25   Medicare Important Message Given:  Yes     [name] 01/13/2022, 8:40 AM

## 2022-01-13 NOTE — TOC CM/SW Note (Signed)
HF TOC CM reached out to [facility] rep, Ashly to make aware pt will dc to SNF for rehab. Updated him that qualifying sat and order in Epic to have oxygen liter flow increased to a 10L tank at home. [place], Heart Failure TOC CM [phone]

## 2022-01-13 NOTE — Progress Notes (Signed)
Mobility Specialist Progress Note:   01/13/22 1000  Mobility  Bed Position Chair  Activity Ambulated with assistance in hallway  Level of Assistance Modified independent, requires aide device or extra time  Assistive Device Four wheel walker  Distance Ambulated (ft) 450 ft  Activity Response Tolerated well  $Mobility charge 1 Mobility   Pt received in bed willing to participate in mobility. No complaints of pain and asymptomatic. Pt left in chair with call bell in reach and all needs met.   [facility] Public librarian Phone [phone] Secondary Phone [phone]

## 2022-01-13 NOTE — Care Management Important Message (Signed)
Important Message  Patient Details  Name: Elizabeth Melton MRN: [id number] Date of Birth: July 07, 1941   Medicare Important Message Given:  Yes     [name] 01/13/2022, 9:16 AM

## 2022-01-13 NOTE — Telephone Encounter (Addendum)
Prior Authorization for TIT sent to [facility] via web portal.  AUTHORIZATION NUMBER [id number]  OK to schedule Valid dates 01/28/22..02/27/22.

## 2022-01-13 NOTE — TOC Transition Note (Addendum)
Transition of Care [facility]) - CM/SW Discharge Note   Patient Details  Name: Elizabeth Melton MRN: [id number] Date of Birth: [birth year]  Transition of Care [facility]) CM/SW Contact:  Khamil Lamica, LCSW Phone Number: 01/13/2022, 10:29 AM   Clinical Narrative:    Patient will DC to: [facility] Anticipated DC date: 01/13/22 Family notified: yes dtr, [name] Transport by: Corey Harold   Per MD patient ready for DC to . RN to call report prior to discharge [phone] Room #803). RN, patient, patient's family, and facility notified of DC. Discharge Summary and FL2 sent to facility. COVID test negative. HF CSW reached out to [facility] SN[facility] about needing a CPAP and [name] reported they will need to order one and do not have one for her to use tonight. HF RNCM, Alesia spoke to [place] at [facility] about getting a CPAP to the facility today for her to use tonight until [facility]'s CPAP arrives. Updated DC summary sent to [facility] with updated instructions for the CPAP. DC packet on chart. Ambulance transport requested for patient.   CSW will sign off for now as social work intervention is no longer needed. Please consult [place] again if new needs arise.     Final next level of care: Skilled Nursing Facility Barriers to Discharge: No Barriers Identified   Patient Goals and CMS Choice Patient states their goals for this hospitalization and ongoing recovery are:: wants to get better CMS Medicare.gov Compare Post Acute Care list provided to:: Patient Choice offered to / list presented to : Patient  Discharge Placement PASRR number recieved: 01/13/22 Existing PASRR number confirmed : 01/13/22          Patient chooses bed at: [facility] Patient to be transferred to facility by: [place] Name of family member notified: daughter, [name] Patient and family notified of of transfer: 01/13/22  Discharge Plan and Services In-house Referral: Clinical Social Work Discharge Planning Services: CM Consult Post Acute Care Choice:  Home Health            DME Agency: [employer] Date DME Agency Contacted: 12/30/21 Time DME Agency Contacted: 24 Representative spoke with at DME Agency: [place] ([place]) Interventions     Readmission Risk Interventions Readmission Risk Prevention Plan 09/10/2020  Post Dischage Appt Complete  Medication Screening Complete  Transportation Screening Complete  Some recent data might be hidden     Mickaela Starlin, MSW, LCSW [phone] Heart Failure Social Worker

## 2022-01-13 NOTE — TOC CM/SW Note (Addendum)
HF TOC CM received call from [employer] rep, Jermaine and they can set pt up with CPAP until her CPAP is approved by insurance. He will take to facility this evening. [facility] CCM, Heart Failure TOC CM [phone]   HF TOC CM spoke to [employer] for CPAP, pt had sleep study and CPAP recommended. TOC CM sent notification to Cardiology, Dr Radford Pax. Scheduled dc to SNF and spoke to [facility] for CPAP to used at night. They will order CPAP for pt to use until she receives her device. Attending updated and orders entered for CPAP.    [place], Heart Failure TOC CM [phone]

## 2022-01-14 DIAGNOSIS — I10 Essential (primary) hypertension: Secondary | ICD-10-CM | POA: Diagnosis not present

## 2022-01-14 DIAGNOSIS — I5033 Acute on chronic diastolic (congestive) heart failure: Secondary | ICD-10-CM | POA: Diagnosis not present

## 2022-01-14 DIAGNOSIS — E118 Type 2 diabetes mellitus with unspecified complications: Secondary | ICD-10-CM | POA: Diagnosis not present

## 2022-01-14 DIAGNOSIS — E039 Hypothyroidism, unspecified: Secondary | ICD-10-CM | POA: Diagnosis not present

## 2022-01-14 DIAGNOSIS — G4733 Obstructive sleep apnea (adult) (pediatric): Secondary | ICD-10-CM | POA: Diagnosis not present

## 2022-01-14 DIAGNOSIS — F17200 Nicotine dependence, unspecified, uncomplicated: Secondary | ICD-10-CM | POA: Diagnosis not present

## 2022-01-14 DIAGNOSIS — J9610 Chronic respiratory failure, unspecified whether with hypoxia or hypercapnia: Secondary | ICD-10-CM | POA: Diagnosis not present

## 2022-01-14 DIAGNOSIS — N189 Chronic kidney disease, unspecified: Secondary | ICD-10-CM | POA: Diagnosis not present

## 2022-01-17 DIAGNOSIS — E039 Hypothyroidism, unspecified: Secondary | ICD-10-CM | POA: Diagnosis not present

## 2022-01-17 DIAGNOSIS — I4891 Unspecified atrial fibrillation: Secondary | ICD-10-CM | POA: Diagnosis not present

## 2022-01-17 DIAGNOSIS — J849 Interstitial pulmonary disease, unspecified: Secondary | ICD-10-CM | POA: Diagnosis not present

## 2022-01-17 DIAGNOSIS — I10 Essential (primary) hypertension: Secondary | ICD-10-CM | POA: Diagnosis not present

## 2022-01-17 DIAGNOSIS — Z86711 Personal history of pulmonary embolism: Secondary | ICD-10-CM | POA: Diagnosis not present

## 2022-01-17 DIAGNOSIS — M6281 Muscle weakness (generalized): Secondary | ICD-10-CM | POA: Diagnosis not present

## 2022-01-17 DIAGNOSIS — J449 Chronic obstructive pulmonary disease, unspecified: Secondary | ICD-10-CM | POA: Diagnosis not present

## 2022-01-17 DIAGNOSIS — I5033 Acute on chronic diastolic (congestive) heart failure: Secondary | ICD-10-CM | POA: Diagnosis not present

## 2022-01-17 DIAGNOSIS — K219 Gastro-esophageal reflux disease without esophagitis: Secondary | ICD-10-CM | POA: Diagnosis not present

## 2022-01-18 NOTE — Progress Notes (Signed)
PCP: Kathyrn Lass, MD Cardiology: Dr. Gardiner Rhyme HF Cardiology: Dr. Aundra Dubin  81 y.o. with history of PE, paroxysmal atrial fibrillation, ILD, COPD, and diastolic CHF with RV failure was referred by Dr. Gardiner Rhyme for evaluation of pulmonary hypertension and CHF. Patient had a submassive PE in 9/21 with saddle embolus.  V/Q scan in 9/22 showed no acute or chronic PE.  In 2/22, she was admitted with COVID-19 PNA.  This was complicated by atrial fibrillation with RVR.  Amiodarone was started but later stopped with rise in LFTs.   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  [place] in 7/22 showed elevated PCWP and moderate pulmonary HTN, primarily pulmonary venous hypertension.  She stopped smoking earlier this year.  Echo in 11/22 showed EF 60-65%, mildly decreased RV systolic function with mild RV enlargement, mild MR, moderate TR, PASP 85 mmHg, mild aortic stenosis and mild aortic insufficiency, IVC dilated.   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   Sleep study 01/23: Moderate OSA  Patient admitted to [facility] 01/18-02/03/23 with acute on chronic respiratory failure with hypoxia. Initially required BiPAP. Had missed several doses of home lasix. Diuresed with IV lasix then transitioned to torsemide 40 mg daily. [place] 01/27 demonstrated optimized filling pressures and severe PAH. Seen by Pulmonology d/t persistently high 02 needs. Treated with prednisone burst and doxycycline for possible pneumonia. Seen by Palliative Care during admit and wanted to remain full code. Discharged to SNF.  She is here today for hospital follow-up. Dyspnea improved from a couple of weeks ago. O2 needs down to 4L [address] at SNF. She is working with PT/OT, main limitation has been back pain. Denies orthopnea, PND or LE edema. No dizziness or palpitations. Weight 158 lb today, same as discharge weight.  Now has CPAP machine but has  had difficulty using the machine.   PMH: 1. Type 2 diabetes. 2. Submassive PE: Saddle embolus in 9/21.  3. Hyperlipidemia 4. Hypothyroidism 5. Atrial fibrillation: Paroxysmal.  Elevated LFTs with amiodarone.  6. COVID-19 PNA 2/22 7. ILD: UIP.  Followed by Dr. [name].  8. COPD/emphysema: Prior smoker, quit 2022.  - Home oxygen.  9. Pulmonary hypertension/RV failure: Echo (11/22) with EF 60-65%, mildly decreased RV systolic function with mild RV enlargement, mild MR, moderate TR, PASP 85 mmHg, mild aortic stenosis and mild aortic insufficiency, IVC dilated.  - V/Q scan (9/22): Low probability for acute or chronic PE.  - CT chest (8/22): UIP and moderate emphysema.  - RHC (7/22): mean RA 8, PA 62/20 mean 37, mean PCWP 28, CI 2.15 thermo/1.65 Fick.  - LHC/RHC (12/22): nonobstructive CAD; mean RA 1, PA 73/25 mean 42, mean PCWP 14, CI 1.97, PVR 7.7 WU.  - RHC (1/23): Optimized filling pressures, severe pulmonary hypertension.  Social History   Socioeconomic History   Marital status: Widowed    Spouse name: Not on file   Number of children: Not on file   Years of education: Not on file   Highest education level: Not on file  Occupational History   Occupation: retired  Tobacco Use   Smoking status: Every Day    Packs/day: 1.00    Years: 60.00    Pack years: 60.00    Types: Cigarettes    Start date: 12/11/1958   Smokeless tobacco: Former    Quit date: 09/24/1962   Tobacco comments:    3cigs per day as of 12/15/21 ep  Vaping  Use   Vaping Use: Never used  Substance and Sexual Activity   Alcohol use: Yes    Comment: drinks beer 12 every 2 weeks   Drug use: No   Sexual activity: Not on file  Other Topics Concern   Not on file  Social History Narrative   Not on file   Social Determinants of Health   Financial Resource Strain: Not on file  Food Insecurity: No Food Insecurity   Worried About Running Out of Food in the Last Year: Never true   Ran Out of Food in the Last Year:  Never true  Transportation Needs: No Transportation Needs   Lack of Transportation (Medical): No   Lack of Transportation (Non-Medical): No  Physical Activity: Not on file  Stress: Not on file  Social Connections: Not on file  Intimate Partner Violence: Not on file   Family History  Problem Relation Age of Onset   Diabetes Mother    Kidney disease Mother    Hypertension Mother    Other Father        tuberculosis   Hypertension Sister    Hyperlipidemia Sister    ROS: All systems reviewed and negative except as per HPI.   Current Outpatient Medications  Medication Sig Dispense Refill   acetaminophen (TYLENOL) 500 MG tablet Take 1,000 mg by mouth every 8 (eight) hours as needed for headache or mild pain (pain).     albuterol (PROVENTIL) (2.5 MG/3ML) 0.083% nebulizer solution Take 3 mLs (2.5 mg total) by nebulization every 4 (four) hours as needed for wheezing or shortness of breath. 75 mL 0   apixaban (ELIQUIS) 5 MG TABS tablet Take 1 tablet (5 mg total) by mouth 2 (two) times daily. 56 tablet 0   b complex vitamins tablet Take 1 tablet by mouth daily.     Calcium Carb-Cholecalciferol (CALCIUM + D3) 600-200 MG-UNIT TABS Take 2 tablets by mouth daily.     Potassium Chloride (KLOR-CON PO) Take 20 mEq by mouth daily.     torsemide (DEMADEX) 20 MG tablet Patient takes 2 tablets once daily.     bisacodyl (DULCOLAX) 10 MG suppository Place 10 mg rectally daily as needed for moderate constipation.     Blood Glucose Monitoring Suppl (TRUE METRIX METER) w/Device KIT 1 each by Other route in the morning and at bedtime.     buPROPion (WELLBUTRIN XL) 150 MG 24 hr tablet Take 1 tablet (150 mg total) by mouth daily. 90 tablet 0   diphenhydrAMINE (BENADRYL) 25 MG tablet Take 25 mg by mouth every 6 (six) hours as needed for allergies.     DULoxetine (CYMBALTA) 30 MG capsule Take 1 capsule (30 mg total) by mouth daily. 30 capsule 0   glucose blood test strip 1 each by Other route as needed for other  (blood sugar).     guaiFENesin (MUCINEX) 600 MG 12 hr tablet Take 1 tablet (600 mg total) by mouth 2 (two) times daily.     insulin glargine (LANTUS) 100 UNIT/ML injection Inject 10 Units into the skin daily.     Insulin Pen Needle (PEN NEEDLES 29GX1/2") 29G X 12MM MISC For insulin injection 50 each 0   levothyroxine (SYNTHROID, LEVOTHROID) 75 MCG tablet Take 75 mcg by mouth daily before breakfast.      lidocaine (LIDODERM) 5 % Place 1 patch onto the skin at bedtime. Remove & Discard patch within 12 hours or as directed by MD 30 patch 0   lovastatin (MEVACOR) 40 MG tablet Take  40 mg by mouth at bedtime.     methocarbamol 1000 MG TABS Take 1,000 mg by mouth every 8 (eight) hours as needed for muscle spasms.     metoprolol succinate (TOPROL-XL) 25 MG 24 hr tablet Take 0.5 tablets (12.5 mg total) by mouth daily. 30 tablet 3   nicotine (NICODERM CQ - DOSED IN MG/24 HOURS) 14 mg/24hr patch Place 14 mg onto the skin daily.     Omega-3 Fatty Acids (FISH OIL) 1000 MG CAPS Take 1,000 mg by mouth daily.     OXYGEN Inhale 5 L into the lungs continuous.     pantoprazole (PROTONIX) 40 MG tablet Take 1 tablet (40 mg total) by mouth daily. 30 tablet 0   senna-docusate (SENOKOT-S) 8.6-50 MG tablet Take 2 tablets by mouth at bedtime.     SMART SENSE THIN LANCETS 26G MISC 1 each by Does not apply route 2 (two) times daily.     Tiotropium Bromide-Olodaterol (STIOLTO RESPIMAT) 2.5-2.5 MCG/ACT AERS Inhale 2 puffs into the lungs daily.     traMADol (ULTRAM) 50 MG tablet Take 1 tablet (50 mg total) by mouth every 6 (six) hours as needed for moderate pain. 30 tablet 0   VITAMIN D PO Take 1,200 Units by mouth daily.     Current Facility-Administered Medications  Medication Dose Route Frequency Provider Last Rate Last Admin   sodium chloride flush (NS) 0.9 % injection 3 mL  3 mL Intravenous Q12H [name], MD       Vitals:   01/19/22 1138  BP: (!) 142/62  Pulse: 66  SpO2: 98%   Filed Weights    01/19/22 1138  Weight: 72 kg (158 lb 12.8 oz)     General:  Chronically ill appearing elderly female. On 4L [place]. HEENT: normal Neck: supple. no JVD. Carotids 2+ bilat; no bruits. No lymphadenopathy or thryomegaly appreciated. Cor: PMI nondisplaced. Regular rate & rhythm. No rubs, gallops, 1/6 SEM RUSB Lungs: diminished Abdomen: soft, nontender, nondistended. No hepatosplenomegaly.  Extremities: no cyanosis, clubbing, rash, trace edema Neuro: alert & orientedx3, cranial nerves grossly intact. moves all 4 extremities w/o difficulty. Affect pleasant   Assessment/Plan: 1. Atrial fibrillation: Paroxysmal.  Regular rhythm on exam today.  SR during recent admit. She had elevated LFTs with amiodarone in the past.  - Continue apixaban.  2. Pulmonary embolus: Submassive, 9/21.  No evidence for chronic PE on 9/22 V/Q scan.  - She is on apixaban.  3. COPD/emphysema: She quit smoking in in 12/22  She is on home oxygen, currently on 4L.  4. Interstitial lung disease: UIP, followed by Dr. [name].  - She is considering anti-fibrotic treatment.  5. 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 The RV failure may be primarily due to pulmonary hypertension.  - Admit 01/23 with a/c CHF after missing several doses of diuretics. [place] 01/27 after diuresis demonstrated optimized filling pressures and severe PAH. - Volume looks good. Continue torsemide 40 mg daily. - BMET today 6. Pulmonary hypertension: Pulmonary venous hypertension on 7/22 RHC.  However, last echo in 11/22 showed PASP up to 85 mmHg. No evidence for chronic PE on 9/22 V/Q scan.  [place] in 01/23 showed normal filling pressures with severe pulmonary arterial hypertension.  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    -  With PH-ILD, a trial of Tyvaso-DPI would be reasonable.  Application pending. Reviewed with HF Pharmacy team. - Having trouble using CPAP machine. She was provided contact information for Dr. Theodosia Blender office. 7. Chest pain: She had significantly elevated troponin in 2/22 hospitalization.  Coronary angiography in 12/22 showed nonobstructive mild CAD. No recent CP.  Follow-up: with Dr. Aundra Dubin as scheduled in April 2023  [facility], [place] 01/19/2022

## 2022-01-19 ENCOUNTER — Other Ambulatory Visit: Payer: Self-pay

## 2022-01-19 ENCOUNTER — Ambulatory Visit (HOSPITAL_COMMUNITY)
Admit: 2022-01-19 | Discharge: 2022-01-19 | Disposition: A | Discharge status: Home / Self Care | Payer: Medicare HMO | Attending: Physician Assistant | Admitting: Physician Assistant

## 2022-01-19 ENCOUNTER — Encounter (HOSPITAL_COMMUNITY): Payer: Self-pay

## 2022-01-19 VITALS — BP 142/62 | HR 66 | Wt 158.8 lb

## 2022-01-19 DIAGNOSIS — Z8701 Personal history of pneumonia (recurrent): Secondary | ICD-10-CM | POA: Insufficient documentation

## 2022-01-19 DIAGNOSIS — I10 Essential (primary) hypertension: Secondary | ICD-10-CM | POA: Diagnosis not present

## 2022-01-19 DIAGNOSIS — Z86711 Personal history of pulmonary embolism: Secondary | ICD-10-CM | POA: Insufficient documentation

## 2022-01-19 DIAGNOSIS — G4733 Obstructive sleep apnea (adult) (pediatric): Secondary | ICD-10-CM | POA: Diagnosis not present

## 2022-01-19 DIAGNOSIS — R079 Chest pain, unspecified: Secondary | ICD-10-CM | POA: Insufficient documentation

## 2022-01-19 DIAGNOSIS — Z8616 Personal history of COVID-19: Secondary | ICD-10-CM | POA: Diagnosis not present

## 2022-01-19 DIAGNOSIS — Z7901 Long term (current) use of anticoagulants: Secondary | ICD-10-CM | POA: Diagnosis not present

## 2022-01-19 DIAGNOSIS — I5032 Chronic diastolic (congestive) heart failure: Secondary | ICD-10-CM | POA: Diagnosis not present

## 2022-01-19 DIAGNOSIS — Z9981 Dependence on supplemental oxygen: Secondary | ICD-10-CM | POA: Diagnosis not present

## 2022-01-19 DIAGNOSIS — Z87891 Personal history of nicotine dependence: Secondary | ICD-10-CM | POA: Insufficient documentation

## 2022-01-19 DIAGNOSIS — I272 Pulmonary hypertension, unspecified: Secondary | ICD-10-CM

## 2022-01-19 DIAGNOSIS — I480 Paroxysmal atrial fibrillation: Secondary | ICD-10-CM

## 2022-01-19 DIAGNOSIS — I2510 Atherosclerotic heart disease of native coronary artery without angina pectoris: Secondary | ICD-10-CM | POA: Insufficient documentation

## 2022-01-19 DIAGNOSIS — M549 Dorsalgia, unspecified: Secondary | ICD-10-CM | POA: Insufficient documentation

## 2022-01-19 DIAGNOSIS — J439 Emphysema, unspecified: Secondary | ICD-10-CM | POA: Diagnosis not present

## 2022-01-19 DIAGNOSIS — I352 Nonrheumatic aortic (valve) stenosis with insufficiency: Secondary | ICD-10-CM | POA: Diagnosis not present

## 2022-01-19 DIAGNOSIS — J84112 Idiopathic pulmonary fibrosis: Secondary | ICD-10-CM | POA: Insufficient documentation

## 2022-01-19 DIAGNOSIS — J449 Chronic obstructive pulmonary disease, unspecified: Secondary | ICD-10-CM | POA: Diagnosis not present

## 2022-01-19 DIAGNOSIS — I5033 Acute on chronic diastolic (congestive) heart failure: Secondary | ICD-10-CM | POA: Diagnosis not present

## 2022-01-19 DIAGNOSIS — Z79899 Other long term (current) drug therapy: Secondary | ICD-10-CM | POA: Insufficient documentation

## 2022-01-19 DIAGNOSIS — I2721 Secondary pulmonary arterial hypertension: Secondary | ICD-10-CM | POA: Diagnosis not present

## 2022-01-19 DIAGNOSIS — M545 Low back pain, unspecified: Secondary | ICD-10-CM | POA: Diagnosis not present

## 2022-01-19 LAB — BASIC METABOLIC PANEL
Anion gap: 10 (ref 5–15)
BUN: 31 mg/dL — ABNORMAL HIGH (ref 8–23)
CO2: 25 mmol/L (ref 22–32)
Calcium: 9.3 mg/dL (ref 8.9–10.3)
Chloride: 101 mmol/L (ref 98–111)
Creatinine, Ser: 1.22 mg/dL — ABNORMAL HIGH (ref 0.44–1.00)
GFR, Estimated: 45 mL/min — ABNORMAL LOW (ref >60)
Glucose, Bld: 110 mg/dL — ABNORMAL HIGH (ref 70–99)
Potassium: 4.3 mmol/L (ref 3.5–5.1)
Sodium: 136 mmol/L (ref 135–145)

## 2022-01-19 NOTE — Patient Instructions (Addendum)
Thank you for coming in today  Labs were done today, if any labs are abnormal the clinic will call you  Your physician recommends that you schedule a follow-up appointment in: please keep follow up as scheduled 03/11/2022  Please call Fransico Him about CPAP at [phone]  At the [facility], you and your health needs are our priority. As part of our continuing mission to provide you with exceptional heart care, we have created designated Provider Care Teams. These Care Teams include your primary Cardiologist (physician) and Advanced Practice Providers (APPs- Physician Assistants and Nurse Practitioners) who all work together to provide you with the care you need, when you need it.   You may see any of the following providers on your designated Care Team at your next follow up: Dr Glori Bickers Dr Haynes Kerns, NP Lyda Jester, [place] [facility] [place], [place] Audry Riles, PharmD   Please be sure to bring in all your medications bottles to every appointment.   If you have any questions or concerns before your next appointment please send [place] a message through [place] or call our office at [phone].    TO LEAVE A MESSAGE FOR THE NURSE SELECT OPTION 2, PLEASE LEAVE A MESSAGE INCLUDING: YOUR NAME DATE OF BIRTH CALL BACK NUMBER REASON FOR CALL**this is important as we prioritize the call backs  YOU WILL RECEIVE A CALL BACK THE SAME DAY AS LONG AS YOU CALL BEFORE 4:00 PM   .

## 2022-01-20 ENCOUNTER — Non-Acute Institutional Stay: Payer: Self-pay | Admitting: Primary Care

## 2022-01-20 ENCOUNTER — Inpatient Hospital Stay: Payer: No Typology Code available for payment source | Admitting: Nurse Practitioner

## 2022-01-20 DIAGNOSIS — J449 Chronic obstructive pulmonary disease, unspecified: Secondary | ICD-10-CM | POA: Diagnosis not present

## 2022-01-20 DIAGNOSIS — M25552 Pain in left hip: Secondary | ICD-10-CM | POA: Diagnosis not present

## 2022-01-20 DIAGNOSIS — J849 Interstitial pulmonary disease, unspecified: Secondary | ICD-10-CM

## 2022-01-20 DIAGNOSIS — Z515 Encounter for palliative care: Secondary | ICD-10-CM

## 2022-01-20 DIAGNOSIS — R0689 Other abnormalities of breathing: Secondary | ICD-10-CM | POA: Diagnosis not present

## 2022-01-20 DIAGNOSIS — R0600 Dyspnea, unspecified: Secondary | ICD-10-CM | POA: Diagnosis not present

## 2022-01-20 DIAGNOSIS — J439 Emphysema, unspecified: Secondary | ICD-10-CM

## 2022-01-20 DIAGNOSIS — I50811 Acute right heart failure: Secondary | ICD-10-CM | POA: Diagnosis not present

## 2022-01-20 NOTE — Progress Notes (Signed)
[facility] Palliative Care Consult Note Telephone: [phone]  Fax: [phone]    Date of encounter: 01/20/22 3:17 PM PATIENT NAME: Elizabeth Melton [id number]   [phone] (home)  DOB: 10-26-1941 MRN: [id number] PRIMARY CARE PROVIDER:    Kathyrn Lass, MD,  [place] [address] [phone]  REFERRING PROVIDER:   Rica Koyanagi MD  RESPONSIBLE PARTY:    Contact Information     Name Relation Home Work Mobile   [place] Daughter [phone]  [phone]       I met [facility] to [facility]. Palliative Care was asked to follow this patient by consultation request of  Rica Koyanagi MD  to address advance care planning and complex medical decision making. This is a follow up visit.                     ASSESSMENT AND PLAN / RECOMMENDATIONS:   Advance Care Planning/Goals of Care: Goals include to maximize quality of life and symptom management. Patient/health care surrogate gave his/her permission to discuss.Our advance care planning conversation included a discussion about:     Exploration of personal, cultural or spiritual beliefs that might influence medical decisions  Exploration of goals of care in the event of a sudden injury or illness  Identification of a healthcare agent - daughter Review of an  advance directive document . CODE STATUS: Full scope of treatment  Symptom Management/Plan:  I met with patient in her nursing home room. She's currently on 5 L of oxygen. She outlined it this morning she went to a pulmonary appointment but when she got there they said it was canceled.   She's upset about this and cannot get back to see pulmonary for two months. I have reached out to pulmonary office to get her in sooner to see NP Cobb.   She states she is getting exercising here at the nursing home but does look forward to going back to her home.  She  endorses pain in her right hip and going down her leg. I would recommend around the clock scheduled medication for pain  Follow up Palliative Care Visit: Palliative care will continue to follow for complex medical decision making, advance care planning, and clarification of goals. Return 2 weeks or prn.  I spent 25 minutes providing this consultation. More than 50% of the time in this consultation was spent in counseling and care coordination.   PPS: 30%  HOSPICE ELIGIBILITY/DIAGNOSIS: TBD  Chief Complaint: dyspnea  HISTORY OF PRESENT ILLNESS:  Elizabeth Melton is a 81 y.o. year old female  with ILD, dyspnea, oxygen dependence, weakness.  Past history of COPD, interstitial lung disease, chronic respiratory failure with hypoxia on 4 to 5 L oxygen via nasal cannula at home, severe pulmonary hypertension, chronic diastolic CHF, diabetes mellitus type 2, hypertension, paroxysmal A. fib, history of pulmonary embolism on Eliquis, PAD presented with worsening shortness of breath. She is in SNF for rehab following hospital for  debility from hypoxia. Missed appt today with pulmonary due to someone at the SNF cancelling it, per her report. I have reached out to reschedule.  History obtained from review of EMR, discussion with primary team, and interview with family, facility staff/caregiver and/or Elizabeth Melton.  I reviewed available labs, medications, imaging, studies and related documents from the EMR.  Records reviewed and summarized above.   ROS Elizabeth Melton  General: NAD  ENMT: denies dysphagia Cardiovascular: denies chest pain,endorses DOE Pulmonary: endorses cough, endorses increased SOB Abdomen: endorses good appetite, denies constipation, endorses continence of bowel GU: denies dysuria, endorses continence of urine MSK:  endorses  increased weakness,  no falls reported Skin: denies rashes or wounds Neurological: endorses R leg pain, denies insomnia Psych: Endorses anxious  mood Heme/lymph/immuno: denies bruises, abnormal bleeding  Physical Exam: Current and past weights: 158 lbs Constitutional: NAD General: frail appearing,  WNWD EYES: anicteric sclera, lids intact, no discharge  ENMT: intact hearing, oral mucous membranes moist, CV: 1+ bil  LE edema Pulmonary: + increased work of breathing, + cough, 5 l oxygen Abdomen: intake 50%,  no ascites GU: deferred MSK: mild  sarcopenia, moves all extremities, ambulatory per report Skin: warm and dry, no rashes or wounds on visible skin Neuro:  + generalized weakness,  no cognitive impairment Psych: anxious affect, A and O x 3 Hem/lymph/immuno: no widespread bruising   Thank you for the opportunity to participate in the care of Elizabeth Melton.  The palliative care team will continue to follow. Please call our office at [phone] if we can be of additional assistance.   Jason Coop, NP DNP, AGPCNP-BC  COVID-19 PATIENT SCREENING TOOL Asked and negative response unless otherwise noted:   Have you had symptoms of covid, tested positive or been in contact with someone with symptoms/positive test in the past 5-10 days?

## 2022-01-23 DIAGNOSIS — Z86711 Personal history of pulmonary embolism: Secondary | ICD-10-CM | POA: Diagnosis not present

## 2022-01-23 DIAGNOSIS — J449 Chronic obstructive pulmonary disease, unspecified: Secondary | ICD-10-CM | POA: Diagnosis not present

## 2022-01-23 DIAGNOSIS — I5033 Acute on chronic diastolic (congestive) heart failure: Secondary | ICD-10-CM | POA: Diagnosis not present

## 2022-01-23 DIAGNOSIS — R040 Epistaxis: Secondary | ICD-10-CM | POA: Diagnosis not present

## 2022-01-24 DIAGNOSIS — J449 Chronic obstructive pulmonary disease, unspecified: Secondary | ICD-10-CM | POA: Diagnosis not present

## 2022-01-24 DIAGNOSIS — J9620 Acute and chronic respiratory failure, unspecified whether with hypoxia or hypercapnia: Secondary | ICD-10-CM | POA: Diagnosis not present

## 2022-01-24 DIAGNOSIS — J849 Interstitial pulmonary disease, unspecified: Secondary | ICD-10-CM | POA: Diagnosis not present

## 2022-01-24 DIAGNOSIS — I5033 Acute on chronic diastolic (congestive) heart failure: Secondary | ICD-10-CM | POA: Diagnosis not present

## 2022-01-24 NOTE — Addendum Note (Signed)
Addended by: Freada Bergeron on: 01/24/2022 02:50 PM   Modules accepted: Orders

## 2022-01-25 ENCOUNTER — Ambulatory Visit: Payer: Medicare HMO | Admitting: Nurse Practitioner

## 2022-01-25 ENCOUNTER — Encounter: Payer: Self-pay | Admitting: Nurse Practitioner

## 2022-01-25 ENCOUNTER — Emergency Department: Payer: Medicare HMO

## 2022-01-25 ENCOUNTER — Inpatient Hospital Stay
Admission: EM | Admit: 2022-01-25 | Discharge: 2022-01-28 | DRG: 190 | Disposition: A | Discharge status: Home Health | Payer: Medicare HMO | Attending: Internal Medicine | Admitting: Internal Medicine

## 2022-01-25 ENCOUNTER — Other Ambulatory Visit: Payer: Self-pay

## 2022-01-25 ENCOUNTER — Ambulatory Visit (INDEPENDENT_AMBULATORY_CARE_PROVIDER_SITE_OTHER): Payer: Medicare HMO

## 2022-01-25 VITALS — BP 110/60 | HR 98 | Temp 98.4°F | Ht 66.0 in | Wt 162.4 lb

## 2022-01-25 DIAGNOSIS — J84112 Idiopathic pulmonary fibrosis: Secondary | ICD-10-CM

## 2022-01-25 DIAGNOSIS — N1832 Chronic kidney disease, stage 3b: Secondary | ICD-10-CM | POA: Diagnosis present

## 2022-01-25 DIAGNOSIS — J849 Interstitial pulmonary disease, unspecified: Secondary | ICD-10-CM | POA: Diagnosis not present

## 2022-01-25 DIAGNOSIS — Z841 Family history of disorders of kidney and ureter: Secondary | ICD-10-CM | POA: Diagnosis not present

## 2022-01-25 DIAGNOSIS — Z8616 Personal history of COVID-19: Secondary | ICD-10-CM | POA: Diagnosis not present

## 2022-01-25 DIAGNOSIS — I130 Hypertensive heart and chronic kidney disease with heart failure and stage 1 through stage 4 chronic kidney disease, or unspecified chronic kidney disease: Secondary | ICD-10-CM | POA: Diagnosis present

## 2022-01-25 DIAGNOSIS — Z9981 Dependence on supplemental oxygen: Secondary | ICD-10-CM

## 2022-01-25 DIAGNOSIS — I10 Essential (primary) hypertension: Secondary | ICD-10-CM | POA: Diagnosis not present

## 2022-01-25 DIAGNOSIS — R7989 Other specified abnormal findings of blood chemistry: Secondary | ICD-10-CM | POA: Diagnosis present

## 2022-01-25 DIAGNOSIS — I5032 Chronic diastolic (congestive) heart failure: Secondary | ICD-10-CM | POA: Diagnosis present

## 2022-01-25 DIAGNOSIS — E785 Hyperlipidemia, unspecified: Secondary | ICD-10-CM | POA: Diagnosis present

## 2022-01-25 DIAGNOSIS — J441 Chronic obstructive pulmonary disease with (acute) exacerbation: Secondary | ICD-10-CM | POA: Diagnosis present

## 2022-01-25 DIAGNOSIS — J9621 Acute and chronic respiratory failure with hypoxia: Secondary | ICD-10-CM | POA: Diagnosis present

## 2022-01-25 DIAGNOSIS — E039 Hypothyroidism, unspecified: Secondary | ICD-10-CM | POA: Diagnosis present

## 2022-01-25 DIAGNOSIS — I2722 Pulmonary hypertension due to left heart disease: Secondary | ICD-10-CM | POA: Diagnosis not present

## 2022-01-25 DIAGNOSIS — F17210 Nicotine dependence, cigarettes, uncomplicated: Secondary | ICD-10-CM | POA: Diagnosis present

## 2022-01-25 DIAGNOSIS — I110 Hypertensive heart disease with heart failure: Secondary | ICD-10-CM | POA: Diagnosis not present

## 2022-01-25 DIAGNOSIS — Z515 Encounter for palliative care: Secondary | ICD-10-CM

## 2022-01-25 DIAGNOSIS — Z794 Long term (current) use of insulin: Secondary | ICD-10-CM

## 2022-01-25 DIAGNOSIS — J9611 Chronic respiratory failure with hypoxia: Secondary | ICD-10-CM

## 2022-01-25 DIAGNOSIS — J80 Acute respiratory distress syndrome: Secondary | ICD-10-CM | POA: Diagnosis not present

## 2022-01-25 DIAGNOSIS — E1151 Type 2 diabetes mellitus with diabetic peripheral angiopathy without gangrene: Secondary | ICD-10-CM | POA: Diagnosis present

## 2022-01-25 DIAGNOSIS — E1122 Type 2 diabetes mellitus with diabetic chronic kidney disease: Secondary | ICD-10-CM | POA: Diagnosis present

## 2022-01-25 DIAGNOSIS — J8410 Pulmonary fibrosis, unspecified: Secondary | ICD-10-CM | POA: Diagnosis not present

## 2022-01-25 DIAGNOSIS — R0902 Hypoxemia: Secondary | ICD-10-CM

## 2022-01-25 DIAGNOSIS — J449 Chronic obstructive pulmonary disease, unspecified: Secondary | ICD-10-CM

## 2022-01-25 DIAGNOSIS — Z888 Allergy status to other drugs, medicaments and biological substances status: Secondary | ICD-10-CM

## 2022-01-25 DIAGNOSIS — I272 Pulmonary hypertension, unspecified: Secondary | ICD-10-CM | POA: Diagnosis not present

## 2022-01-25 DIAGNOSIS — R0603 Acute respiratory distress: Secondary | ICD-10-CM | POA: Diagnosis present

## 2022-01-25 DIAGNOSIS — Z20822 Contact with and (suspected) exposure to covid-19: Secondary | ICD-10-CM | POA: Diagnosis present

## 2022-01-25 DIAGNOSIS — R069 Unspecified abnormalities of breathing: Secondary | ICD-10-CM | POA: Diagnosis not present

## 2022-01-25 DIAGNOSIS — I509 Heart failure, unspecified: Secondary | ICD-10-CM | POA: Diagnosis not present

## 2022-01-25 DIAGNOSIS — G4733 Obstructive sleep apnea (adult) (pediatric): Secondary | ICD-10-CM | POA: Diagnosis present

## 2022-01-25 DIAGNOSIS — Z8249 Family history of ischemic heart disease and other diseases of the circulatory system: Secondary | ICD-10-CM

## 2022-01-25 DIAGNOSIS — Z7189 Other specified counseling: Secondary | ICD-10-CM | POA: Diagnosis not present

## 2022-01-25 DIAGNOSIS — Z7901 Long term (current) use of anticoagulants: Secondary | ICD-10-CM | POA: Diagnosis not present

## 2022-01-25 DIAGNOSIS — R739 Hyperglycemia, unspecified: Secondary | ICD-10-CM | POA: Diagnosis not present

## 2022-01-25 DIAGNOSIS — I959 Hypotension, unspecified: Secondary | ICD-10-CM | POA: Diagnosis not present

## 2022-01-25 DIAGNOSIS — R0602 Shortness of breath: Secondary | ICD-10-CM | POA: Diagnosis not present

## 2022-01-25 DIAGNOSIS — N179 Acute kidney failure, unspecified: Secondary | ICD-10-CM | POA: Diagnosis present

## 2022-01-25 DIAGNOSIS — I4891 Unspecified atrial fibrillation: Secondary | ICD-10-CM | POA: Diagnosis not present

## 2022-01-25 DIAGNOSIS — Z86711 Personal history of pulmonary embolism: Secondary | ICD-10-CM | POA: Diagnosis not present

## 2022-01-25 DIAGNOSIS — I480 Paroxysmal atrial fibrillation: Secondary | ICD-10-CM | POA: Diagnosis present

## 2022-01-25 DIAGNOSIS — Z833 Family history of diabetes mellitus: Secondary | ICD-10-CM

## 2022-01-25 DIAGNOSIS — R0689 Other abnormalities of breathing: Secondary | ICD-10-CM | POA: Diagnosis not present

## 2022-01-25 DIAGNOSIS — I2721 Secondary pulmonary arterial hypertension: Secondary | ICD-10-CM | POA: Diagnosis present

## 2022-01-25 DIAGNOSIS — Z83438 Family history of other disorder of lipoprotein metabolism and other lipidemia: Secondary | ICD-10-CM

## 2022-01-25 DIAGNOSIS — Z79890 Hormone replacement therapy (postmenopausal): Secondary | ICD-10-CM

## 2022-01-25 DIAGNOSIS — D696 Thrombocytopenia, unspecified: Secondary | ICD-10-CM | POA: Diagnosis present

## 2022-01-25 MED ORDER — MAGNESIUM SULFATE 2 GM/50ML IV SOLN
2.0000 g | Freq: Once | INTRAVENOUS | Status: AC
  Administered 2022-01-26: 2 g via INTRAVENOUS
  Filled 2022-01-25: qty 50

## 2022-01-25 MED ORDER — BREZTRI AEROSPHERE 160-9-4.8 MCG/ACT IN AERO: 2.0000 | INHALATION_SPRAY | Freq: Two times a day (BID) | RESPIRATORY_TRACT | 0 refills | Status: DC

## 2022-01-25 MED ORDER — BREZTRI AEROSPHERE 160-9-4.8 MCG/ACT IN AERO: 2.0000 | INHALATION_SPRAY | Freq: Two times a day (BID) | RESPIRATORY_TRACT | 6 refills | Status: AC

## 2022-01-25 NOTE — Telephone Encounter (Signed)
Received status update regarding Tyvaso DPI from [place]:  Patient is pending PAP through [facility]. I see that the patient has advised they have not received the forms that were faxed a couple times to the [facility] facility. The forms were refaxed 01/24/22 "attention Maudie Mercury" at rehab facility.    Audry Riles, PharmD, BCPS, BCCP, CPP Heart Failure Clinic Pharmacist [phone]

## 2022-01-25 NOTE — Assessment & Plan Note (Addendum)
Treated for AECOPD in hospital. Improved symptoms today with occasional SOB upon exertion. Step up to triple therapy with Breztri Twice daily. Financial assistance application provided. Continue PRN albuterol.   Patient Instructions  -Continue Albuterol inhaler 2 puffs or 3 mL neb every 6 hours as needed for shortness of breath or wheezing. Notify if symptoms persist despite rescue inhaler/neb use. -Continue Eliquis 5 mg Twice daily. Notify of any excessive bleeding/bruising  -Continue mucinex 600 mg Twice daily  -Continue supplemental oxygen 4-5 lpm for goal oxygen saturation >88-90% -Continue protonix 40 mg daily  -Continue diuretics as prescribed by heart failure clinic  Stop Stiolto. Start Breztri 2 puffs Twice daily. Brush tongue and rinse mouth well afterwards  Chest x ray today for follow up. We will notify you of any abnormal results   Follow up with heart failure clinic and cardiology as scheduled.   Discuss options for antifibrotic therapy with Dr. [name] at your next visit.   Follow up in one month with Dr. [name]. If symptoms do not improve or worsen, please contact office for sooner follow up or seek emergency care.

## 2022-01-25 NOTE — ED Triage Notes (Signed)
Pt arrives from [facility] via [facility] with respiratory distress. EMS reports pt is normally on 5L but was satting 59% on it. Pt now on 10-15L [facility] with O2 sat 100%. Pt has hx of COPD, CHF, PE, HTN.

## 2022-01-25 NOTE — Progress Notes (Signed)
_0  ID: Elizabeth Melton, female    DOB: June 15, 1941, 81 y.o.   MRN: [id number]  Chief Complaint  Patient presents with   Hospitalization Follow-up    She feels that she has some shortness of breath and her oxygen is on 6 liters.     Referring provider: Kathyrn Lass, MD  HPI: 81 year old female, current smoker followed for ILD, COPD with chronic bronchitis and emphysema, and chronic respiratory failure on oxygen. She is a patient of Dr. Golden Pop and last seen in office on 12/15/2021. 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   TEST/EVENTS:  07/13/2021 CT chest: CAD. Shotty mediastinal adenopathy within the right paratracheal and prevascular lymph nodes, stable. Moderate centrilobular and paraseptal emphysema in lung apices. Thickening of peribronchovascular interstitium, traction bronchiectasis, and peripheral architectural distortion, similar to prior and compatible with UIP. Cholelithiasis.  08/09/2021 PFTs: FVC 1.8 (89), FEV1 1.18 (76), ratio 66, DLCO 30% 11/04/2021 echocardiogram: EF 60-65%. RV function mildly reduced. RV size mild enlargement. Severely elevated PASP. LA mildly dilated. RA mildly dilated. Pericardial effusion posterior to LV, without tamponade. Mild MR. TR moderate. AR mild. AS mild. Dilated IVC.   12/28/2021-01/13/2022: Hospitalization for acute on chronic respiratory failure, multifactorial but suspected to be primarily related to fluid overload and severe PAH. Previous right heart cath with normal filling pressures, severe PAH with low cardiac output. Not suspected to be overt ILD flare. Advised triple therapy inhaler regimen outpatient. Referred to advanced CHF team for outpatient management of PAH with Tyvaso. Plans to discuss antifibrotics outpatient with Dr. [name]. HRCT scheduled for 02/14/2022. Discharged on 5 lpm with CPAP at night  (followed by Dr. Radford Pax). Prognosis guarded - referred to Palliative.   01/25/2022: Today - hospital follow up Patient presents today for hospital follow up. She reports feeling better with occasional shortness of breath with exertion, which is improved since her hospital stay. She reports her weights are stable and she has not had any increased swelling in her legs. She denies orthopnea, PND, chest pain, palpitations, wheezing, or cough. She has been on 4-5 lpm supplemental O2 at her SNF. She arrived on 6 lpm since there was a 5 setting and felt like it was too high. She was successfully decreased to 4 lpm with sats 90%. She was seen by the advanced HF clinic and is in the process of starting therapy with Tyvaso. She reports she's quit smoking and is using a nicotene patch. She continues on [employer].   Allergies  Allergen Reactions   Lisinopril Other (See Comments)    Other reaction(s): cough 09/20/17    Immunization History  Administered Date(s) Administered   Pneumococcal Conjugate-13 06/03/2014   Pneumococcal Polysaccharide-23 06/25/2007    Past Medical History:  Diagnosis Date   Acute on chronic diastolic (congestive) heart failure (HCC) 05/19/2021   Acute respiratory failure with hypoxia (HCC) 09/06/2020   Atrial fibrillation with RVR (HCC)    Diabetes mellitus without complication (HCC)    Hyperlipidemia    Hypertension    Hypothyroidism    IPF (idiopathic pulmonary fibrosis) ([place])    Pneumonia due to COVID-19 virus 01/16/2021   Thrombocytopenia (HCC)     Tobacco History: Social History   Tobacco Use  Smoking Status Some Days   Packs/day: 1.00   Years: 60.00   Pack years: 60.00   Types: Cigarettes   Start date: 12/11/1958  Smokeless Tobacco Former  Quit date: 09/24/1962  Tobacco Comments   3cigs per day as of 12/15/21 ep   Ready to quit: Not Answered Counseling given: Not Answered Tobacco comments: 3cigs per day as of 12/15/21 ep   Outpatient Medications Prior to Visit   Medication Sig Dispense Refill   acetaminophen (TYLENOL) 500 MG tablet Take 1,000 mg by mouth every 8 (eight) hours as needed for headache or mild pain (pain).     albuterol (PROVENTIL) (2.5 MG/3ML) 0.083% nebulizer solution Take 3 mLs (2.5 mg total) by nebulization every 4 (four) hours as needed for wheezing or shortness of breath. 75 mL 0   apixaban (ELIQUIS) 5 MG TABS tablet Take 1 tablet (5 mg total) by mouth 2 (two) times daily. 56 tablet 0   b complex vitamins tablet Take 1 tablet by mouth daily.     bisacodyl (DULCOLAX) 10 MG suppository Place 10 mg rectally daily as needed for moderate constipation.     Blood Glucose Monitoring Suppl (TRUE METRIX METER) w/Device KIT 1 each by Other route in the morning and at bedtime.     buPROPion (WELLBUTRIN XL) 150 MG 24 hr tablet Take 1 tablet (150 mg total) by mouth daily. 90 tablet 0   Calcium Carb-Cholecalciferol (CALCIUM + D3) 600-200 MG-UNIT TABS Take 2 tablets by mouth daily.     diphenhydrAMINE (BENADRYL) 25 MG tablet Take 25 mg by mouth every 6 (six) hours as needed for allergies.     DULoxetine (CYMBALTA) 30 MG capsule Take 1 capsule (30 mg total) by mouth daily. 30 capsule 0   glucose blood test strip 1 each by Other route as needed for other (blood sugar).     guaiFENesin (MUCINEX) 600 MG 12 hr tablet Take 1 tablet (600 mg total) by mouth 2 (two) times daily. (Patient taking differently: Take 600 mg by mouth 2 (two) times daily. As needed)     insulin glargine (LANTUS) 100 UNIT/ML injection Inject 10 Units into the skin daily.     Insulin Pen Needle (PEN NEEDLES 29GX1/2") 29G X 12MM MISC For insulin injection 50 each 0   levothyroxine (SYNTHROID, LEVOTHROID) 75 MCG tablet Take 75 mcg by mouth daily before breakfast.      lidocaine (LIDODERM) 5 % Place 1 patch onto the skin at bedtime. Remove & Discard patch within 12 hours or as directed by MD 30 patch 0   lovastatin (MEVACOR) 40 MG tablet Take 40 mg by mouth at bedtime.     methocarbamol  1000 MG TABS Take 1,000 mg by mouth every 8 (eight) hours as needed for muscle spasms.     metoprolol succinate (TOPROL-XL) 25 MG 24 hr tablet Take 0.5 tablets (12.5 mg total) by mouth daily. 30 tablet 3   nicotine (NICODERM CQ - DOSED IN MG/24 HOURS) 14 mg/24hr patch Place 14 mg onto the skin daily.     Omega-3 Fatty Acids (FISH OIL) 1000 MG CAPS Take 1,000 mg by mouth daily.     OXYGEN Inhale 4 L into the lungs continuous.     pantoprazole (PROTONIX) 40 MG tablet Take 1 tablet (40 mg total) by mouth daily. 30 tablet 0   Potassium Chloride (KLOR-CON PO) Take 20 mEq by mouth daily.     senna-docusate (SENOKOT-S) 8.6-50 MG tablet Take 2 tablets by mouth at bedtime. (Patient taking differently: Take 2 tablets by mouth at bedtime. As needed)     SMART SENSE THIN LANCETS 26G MISC 1 each by Does not apply route 2 (two) times daily.  torsemide (DEMADEX) 20 MG tablet Patient takes 2 tablets once daily.     traMADol (ULTRAM) 50 MG tablet Take 1 tablet (50 mg total) by mouth every 6 (six) hours as needed for moderate pain. 30 tablet 0   VITAMIN D PO Take 1,000 Units by mouth daily.     Tiotropium Bromide-Olodaterol (STIOLTO RESPIMAT) 2.5-2.5 MCG/ACT AERS Inhale 2 puffs into the lungs daily. As needed     pantoprazole (PROTONIX) 40 MG tablet Take 40 mg by mouth daily. (Patient not taking: Reported on 01/25/2022)     Facility-Administered Medications Prior to Visit  Medication Dose Route Frequency Provider Last Rate Last Admin   sodium chloride flush (NS) 0.9 % injection 3 mL  3 mL Intravenous Q12H [name], MD         Review of Systems:   Constitutional: No weight loss or gain, night sweats, fevers, chills, fatigue, or lassitude. HEENT: No headaches, difficulty swallowing, tooth/dental problems, or sore throat. No sneezing, itching, ear ache, nasal congestion, or post nasal drip CV:  No chest pain, orthopnea, PND, swelling in lower extremities, anasarca, dizziness, palpitations,  syncope Resp: +shortness of breath with exertion (improving). No excess mucus or change in color of mucus. No productive or non-productive. No hemoptysis. No wheezing.  No chest wall deformity GI:  No heartburn, indigestion, abdominal pain, nausea, vomiting, diarrhea, change in bowel habits, loss of appetite, bloody stools.  GU: No dysuria, change in color of urine, urgency or frequency.  No flank pain, no hematuria  Skin: No rash, lesions, ulcerations MSK:  No joint pain or swelling.  No decreased range of motion.  No back pain. Neuro: No dizziness or lightheadedness.  Psych: No depression or anxiety. Mood stable.     Physical Exam:  BP 110/60 (BP Location: Left Arm, Patient Position: Sitting, Cuff Size: Normal)    Pulse 98    Temp 98.4 F (36.9 C) (Oral)    Ht _0  (1.676 m)    Wt 162 lb 6.4 oz (73.7 kg)    SpO2 90%    BMI 26.21 kg/m   GEN: Pleasant, interactive, chronically-ill appearing; in no acute distress. HEENT:  Normocephalic and atraumatic. EACs patent bilaterally. TM pearly gray with present light reflex bilaterally. PERRLA. Sclera white. Nasal turbinates pink, moist and patent bilaterally. No rhinorrhea present. Oropharynx pink and moist, without exudate or edema. No lesions, ulcerations, or postnasal drip.  NECK:  Supple w/ fair ROM. No JVD present. Normal carotid impulses w/o bruits. Thyroid symmetrical with no goiter or nodules palpated. No lymphadenopathy.   CV: RRR, murmur, no r/g, no peripheral edema. Pulses intact, +2 bilaterally. No cyanosis, pallor or clubbing. PULMONARY:  Unlabored, regular breathing. Clear bilaterally A&P w/o wheezes/rales/rhonchi. No accessory muscle use. No dullness to percussion. GI: BS present and normoactive. Soft, non-tender to palpation. No organomegaly or masses detected. No CVA tenderness. MSK: No erythema, warmth or tenderness. Cap refil <2 sec all extrem. No deformities or joint swelling noted.  Neuro: A/Ox3. No focal deficits noted.    Skin: Warm, no lesions or rashe Psych: Normal affect and behavior. Judgement and thought content appropriate.     Lab Results:  CBC    Component Value Date/Time   WBC 6.4 01/12/2022 0259   RBC 5.03 01/12/2022 0259   HGB 16.4 (H) 01/12/2022 0259   HGB 15.6 07/01/2021 1205   HGB 15.8 10/09/2008 1107   HCT 51.5 (H) 01/12/2022 0259   HCT 47.2 (H) 07/01/2021 1205   HCT 45.9 10/09/2008 1107  PLT 197 01/12/2022 0259   PLT 133 (L) 07/01/2021 1205   MCV 102.4 (H) 01/12/2022 0259   MCV 100 (H) 07/01/2021 1205   MCV 103.7 (H) 10/09/2008 1107   MCH 32.6 01/12/2022 0259   MCHC 31.8 01/12/2022 0259   RDW 15.9 (H) 01/12/2022 0259   RDW 12.6 07/01/2021 1205   RDW 14.7 (H) 10/09/2008 1107   LYMPHSABS 2.2 01/11/2022 0449   LYMPHSABS 2.1 10/09/2008 1107   MONOABS 0.6 01/11/2022 0449   MONOABS 0.4 10/09/2008 1107   EOSABS 0.0 01/11/2022 0449   EOSABS 0.0 10/09/2008 1107   BASOSABS 0.1 01/11/2022 0449   BASOSABS 0.0 10/09/2008 1107    BMET    Component Value Date/Time   NA 136 01/19/2022 1211   NA 143 09/20/2021 1449   K 4.3 01/19/2022 1211   CL 101 01/19/2022 1211   CO2 25 01/19/2022 1211   GLUCOSE 110 (H) 01/19/2022 1211   BUN 31 (H) 01/19/2022 1211   BUN 29 (H) 09/20/2021 1449   CREATININE 1.22 (H) 01/19/2022 1211   CALCIUM 9.3 01/19/2022 1211   GFRNONAA 45 (L) 01/19/2022 1211   GFRAA >60 09/09/2020 0252    BNP    Component Value Date/Time   BNP 821.0 (H) 12/29/2021 0331     Imaging:  DG Chest 2 View  Result Date: 01/05/2022 CLINICAL DATA:  Shortness of breath EXAM: CHEST - 2 VIEW COMPARISON:  Previous studies including the examination of 12/28/2021 FINDINGS: Transverse diameter of heart is increased. There is diffuse increase in interstitial markings in both lungs. There is interval improvement in aeration in the right mid and right lower lung fields. No new focal pulmonary consolidation is seen. Lateral CP angles are clear. There is no pneumothorax. IMPRESSION:  Pulmonary fibrosis. Cardiomegaly. There is interval improvement in aeration in the right parahilar region and right lower lung fields suggesting resolving pulmonary edema or resolving pneumonia superimposed over pulmonary fibrosis. Electronically Signed   By: Elmer Picker M.D.   On: 01/05/2022 13:34   DG Lumbar Spine 2-3 Views  Result Date: 01/03/2022 CLINICAL DATA:  Back pain EXAM: LUMBAR SPINE - 2-3 VIEW COMPARISON:  None. FINDINGS: No recent fracture is seen. There is first-degree anterolisthesis at L4-L5 level. There is disc space narrowing at the L4-L5 level. Degenerative changes are noted in facet joints, more so at L4-L5 and L5-S1 levels. Extensive arterial calcifications are seen. Calcifications in the right upper quadrant suggest gallbladder stones. Fibrotic changes are noted in the visualized lower lung fields. IMPRESSION: No recent fracture is seen. There is first-degree anterolisthesis at L4-L5 level along with disc space narrowing. Degenerative changes are noted in facet joints in the lower lumbar spine. Gallbladder stones. Pulmonary fibrosis.  Severe atherosclerotic disease. Electronically Signed   By: Elmer Picker M.D.   On: 01/03/2022 12:30   DG Sacrum/Coccyx  Result Date: 01/03/2022 CLINICAL DATA:  Low back pain EXAM: SACRUM AND COCCYX - 2+ VIEW COMPARISON:  None. FINDINGS: No recent fracture is seen. There is sclerosis adjacent to the SI joints, especially in the inferior aspect of right SI joint. Osteopenia is seen in bony structures. Vascular calcifications are seen. Coarse calcifications in the pelvis suggest possible calcified uterine fibroids. IMPRESSION: No recent fracture is seen. Osteopenia. Degenerative changes are noted in the SI joints, more so on the right side. Electronically Signed   By: Elmer Picker M.D.   On: 01/03/2022 12:32   CARDIAC CATHETERIZATION  Result Date: 01/06/2022 1. Optimized filling pressures. 2. Severe pulmonary arterial hypertension  DG CHEST PORT 1 VIEW  Result Date: 01/09/2022 CLINICAL DATA:  Hypoxia. EXAM: PORTABLE CHEST 1 VIEW COMPARISON:  01/05/2022 FINDINGS: Stable enlarged cardiac silhouette and tortuous and calcified thoracic aorta. No significant change in diffuse prominence of the interstitial markings without superimposed airspace opacity or pleural fluid. Unremarkable bones. IMPRESSION: Stable cardiomegaly and pulmonary fibrosis. No acute abnormality. Stable cardiomegaly and pulmonary fibrosis. No acute abnormality. Electronically Signed   By: Claudie Revering M.D.   On: 01/09/2022 14:22   DG Chest Port 1 View  Result Date: 12/28/2021 CLINICAL DATA:  Shortness of breath EXAM: PORTABLE CHEST 1 VIEW COMPARISON:  11/03/2021 FINDINGS: Unchanged cardiomegaly and aortic atherosclerosis. Redemonstrated extensive interstitial thickening, with possible additional airspace opacities in the lower lungs difficult to exclude. No definite pleural effusion. No acute osseous abnormality. IMPRESSION: Redemonstrated diffuse interstitial thickening, consistent with patient's known pulmonary fibrosis, with additional superimposed airspace opacity difficult to exclude given degree of fibrosis. Electronically Signed   By: Merilyn Baba M.D.   On: 12/28/2021 12:36      PFT Results Latest Ref Rng & Units 08/01/2021 03/17/2020 10/23/2019  FVC-Pre L 1.80 2.15 2.10  FVC-Predicted Pre % 89 103 100  Pre FEV1/FVC % % 66 74 73  FEV1-Pre L 1.18 1.59 1.53  FEV1-Predicted Pre % 76 99 95  DLCO uncorrected ml/min/mmHg 5.69 8.93 8.62  DLCO UNC% % 30 47 46  DLCO corrected ml/min/mmHg 5.69 8.93 -  DLCO COR %Predicted % 30 47 -  DLVA Predicted % 46 72 59    No results found for: NITRICOXIDE      Assessment & Plan:   COPD with chronic bronchitis and emphysema (HCC) Treated for AECOPD in hospital. Improved symptoms today with occasional SOB upon exertion. Step up to triple therapy with Breztri Twice daily. Financial assistance application  provided. Continue PRN albuterol.   Patient Instructions  -Continue Albuterol inhaler 2 puffs or 3 mL neb every 6 hours as needed for shortness of breath or wheezing. Notify if symptoms persist despite rescue inhaler/neb use. -Continue Eliquis 5 mg Twice daily. Notify of any excessive bleeding/bruising  -Continue mucinex 600 mg Twice daily  -Continue supplemental oxygen 4-5 lpm for goal oxygen saturation >88-90% -Continue protonix 40 mg daily  -Continue diuretics as prescribed by heart failure clinic  Stop Stiolto. Start Breztri 2 puffs Twice daily. Brush tongue and rinse mouth well afterwards  Chest x ray today for follow up. We will notify you of any abnormal results   Follow up with heart failure clinic and cardiology as scheduled.   Discuss options for antifibrotic therapy with Dr. [name] at your next visit.   Follow up in one month with Dr. [name]. If symptoms do not improve or worsen, please contact office for sooner follow up or seek emergency care.    ILD (interstitial lung disease) ([place]) Previously has considered antifibrotics. Plans to discuss further at next visit with Dr. [name]. CXR today for follow up.  Chronic respiratory failure with hypoxia (HCC) Stable oxygen requirements.   Pulmonary HTN ([place]) Suspect leading cause of DOE. Weight overall stable. She has had a slight increase since being seen at HF clinic on 2/9 (approx 3.5 lb); advised to monitor (SNF weighing daily) and notify HF clinic of more than 2-3 lb in a day or 5 lb in a week. Continue diuretics as prescribed. Follow up with HF clinic. Phone number provided to patient as she said she did not receive the paperwork for Tyvaso.     Clayton Bibles, NP  01/25/2022  Pt aware and understands NP's role.

## 2022-01-25 NOTE — Assessment & Plan Note (Addendum)
Previously has considered antifibrotics. Plans to discuss further at next visit with Dr. [name]. CXR today for follow up.

## 2022-01-25 NOTE — Assessment & Plan Note (Signed)
Stable oxygen requirements.

## 2022-01-25 NOTE — Patient Instructions (Signed)
-  Continue Albuterol inhaler 2 puffs or 3 mL neb every 6 hours as needed for shortness of breath or wheezing. Notify if symptoms persist despite rescue inhaler/neb use. -Continue Eliquis 5 mg Twice daily. Notify of any excessive bleeding/bruising  -Continue mucinex 600 mg Twice daily  -Continue supplemental oxygen 4-5 lpm for goal oxygen saturation >88-90% -Continue protonix 40 mg daily  -Continue diuretics as prescribed by heart failure clinic  Stop Stiolto. Start Breztri 2 puffs Twice daily. Brush tongue and rinse mouth well afterwards  Chest x ray today for follow up. We will notify you of any abnormal results   Follow up with heart failure clinic and cardiology as scheduled.   Discuss options for antifibrotic therapy with Dr. [name] at your next visit.   Follow up in one month with Dr. [name]. If symptoms do not improve or worsen, please contact office for sooner follow up or seek emergency care.

## 2022-01-25 NOTE — Assessment & Plan Note (Addendum)
Suspect leading cause of DOE. Weight overall stable. She has had a slight increase since being seen at HF clinic on 2/9 (approx 3.5 lb); advised to monitor (SNF weighing daily) and notify HF clinic of more than 2-3 lb in a day or 5 lb in a week. Continue diuretics as prescribed. Follow up with HF clinic. Phone number provided to patient as she said she did not receive the paperwork for Tyvaso.

## 2022-01-25 NOTE — ED Provider Notes (Signed)
[facility] Provider Note    Event Date/Time   First MD Initiated Contact with Patient 01/25/22 2341     (approximate)   History   Respiratory distress   HPI  Elizabeth Melton is a 81 y.o. female brought [facility] the ED [facility] with a chief complaint of respiratory distress.  Patient with a history of COPD, CHF on torsemide on 5 L continuous oxygen at baseline PE on Eliquis.  Recently diagnosed with pneumonia.  Staff reported saturations 59% on 5 L; brought in on nonrebreather oxygen with sats 100%.  Given albuterol nebulizer at the facility, DuoNeb and 125 mg IV Solu-Medrol by EMS.  Patient endorses cough.  Denies fever, chest pain, abdominal pain, nausea, vomiting or diarrhea.     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[place])    Pneumonia due to COVID-19 virus 01/16/2021   Thrombocytopenia [facility])      Active Problem List   Patient Active Problem List   Diagnosis Date Noted   COPD with acute exacerbation ([place]) 01/26/2022   Acute renal failure superimposed on stage 3b chronic kidney disease ([place]) 01/26/2022   Thrombocytopenia (HCC)    Elevated lactic acid level    Low back pain 01/11/2022   Acute respiratory failure ([place]) 12/29/2021   COPD with chronic bronchitis and emphysema (HCC)    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  12/28/2021   Hyperkalemia 12/28/2021   CKD (chronic kidney disease) 12/28/2021   Acute right-sided CHF (congestive heart failure) ([place]) 11/06/2021   Elevated troponin 11/03/2021   Constipation 11/03/2021   Pulmonary HTN (HCC)    Acute on chronic  systolic (congestive) heart failure ([place]) 05/19/2021   Acute on chronic respiratory failure with hypoxia (HCC) 05/19/2021   SOB (shortness of breath) 05/19/2021   AF (paroxysmal atrial fibrillation) ([place]) 05/19/2021   Acute on chronic congestive heart failure ([place])    Demand ischemia (HCC)    Acute respiratory failure with hypoxia ([place]) 09/06/2020   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   ILD (interstitial lung disease) ([place]) 10/23/2019   Chronic respiratory failure with hypoxia ([place]) 10/23/2019   Essential (primary) hypertension 09/18/2018   PAD (peripheral artery disease) ([place]) 09/18/2018   Tobacco abuse 09/18/2018   Aortic regurgitation 09/18/2018   Hypothyroidism    Hyperlipidemia/PAD    Type 2 diabetes mellitus with hyperglycemia [facility])      Past Surgical History   Past Surgical History:  Procedure Laterality Date   RIGHT HEART CATH N/A 07/05/2021   Procedure: RIGHT HEART CATH;  Surgeon: [name], MD;  Location: [place] [facility];  Service: Cardiovascular;  Laterality: N/A;   RIGHT HEART CATH N/A 01/06/2022   Procedure: RIGHT HEART CATH;  Surgeon: Larey Dresser, MD;  Location: [place] [facility];  Service: Cardiovascular;  Laterality: N/A;   RIGHT/LEFT HEART CATH AND CORONARY ANGIOGRAPHY N/A 11/23/2021   Procedure: RIGHT/LEFT HEART CATH AND CORONARY ANGIOGRAPHY;  Surgeon: Larey Dresser, MD;  Location: [place] [facility];  Service: Cardiovascular;  Laterality: N/A;   [place]  Medications   Prior to Admission medications   Medication Sig Start Date End Date Taking? Authorizing Provider  acetaminophen (TYLENOL) 500 MG tablet Take 1,000 mg by mouth every 8 (eight) hours as needed for headache or mild pain (pain).   Yes [provider]  albuterol (PROVENTIL) (2.5 MG/3ML) 0.083% nebulizer solution Take 3 mLs (2.5 mg total) by nebulization every 4 (four) hours as needed for wheezing or shortness of breath. 01/13/22  Yes Domenic Polite, MD  apixaban (ELIQUIS) 5 MG TABS tablet Take 1 tablet (5 mg total) by mouth 2 (two) times daily. 11/24/21  Yes Larey Dresser, MD  b complex vitamins tablet Take 1 tablet by mouth daily.   Yes [provider]  bisacodyl (DULCOLAX) 10 MG suppository Place 10 mg rectally daily as needed for moderate constipation.   Yes [provider]  Blood Glucose Monitoring Suppl (TRUE METRIX METER) w/Device KIT 1 each by Other route in the morning and at bedtime. 12/25/20  Yes [provider]  Budeson-Glycopyrrol-Formoterol (BREZTRI AEROSPHERE) 160-9-4.8 MCG/ACT AERO Inhale 2 puffs into the lungs in the morning and at bedtime. 01/25/22  Yes Cobb, Karie Schwalbe, NP  buPROPion (WELLBUTRIN XL) 150 MG 24 hr tablet Take 1 tablet (150 mg total) by mouth daily. 12/20/21  Yes Larey Dresser, MD  Calcium Carb-Cholecalciferol (CALCIUM + D3) 600-200 MG-UNIT TABS Take 2 tablets by mouth daily.   Yes [provider]  diphenhydrAMINE (BENADRYL) 25 MG tablet Take 25 mg by mouth every 6 (six) hours as needed for allergies.   Yes [provider]  DULoxetine (CYMBALTA) 30 MG capsule Take 1 capsule (30 mg total) by mouth daily. 01/14/22  Yes Domenic Polite, MD  glucose blood test strip 1 each by Other route as needed for other (blood sugar).   Yes [provider]  guaiFENesin (MUCINEX) 600 MG 12 hr tablet Take 1 tablet (600 mg total) by mouth 2 (two) times daily. Patient taking differently: Take 600 mg by mouth 2 (two) times daily. As needed 01/13/22  Yes Domenic Polite, MD  insulin glargine (LANTUS) 100 UNIT/ML injection Inject 10 Units into the skin daily.   Yes [provider]  Insulin Pen Needle (PEN NEEDLES 29GX1/2") 29G X 12MM MISC For insulin injection 05/22/21  Yes [name], MD  levothyroxine (SYNTHROID, LEVOTHROID) 75 MCG tablet Take 75 mcg by mouth daily before breakfast.    Yes [provider]  lidocaine (LIDODERM) 5 % Place 1 patch onto the skin  at bedtime. Remove & Discard patch within 12 hours or as directed by MD 01/13/22  Yes Domenic Polite, MD  lovastatin (MEVACOR) 40 MG tablet Take 40 mg by mouth at bedtime.   Yes [provider]  methocarbamol 1000 MG TABS Take 1,000 mg by mouth every 8 (eight) hours as needed for muscle spasms. 01/13/22  Yes Domenic Polite, MD  metoprolol succinate (TOPROL-XL) 25 MG 24 hr tablet Take 0.5 tablets (12.5 mg total) by mouth daily. 01/28/21  Yes [name], MD  nicotine (NICODERM CQ - DOSED IN MG/24 HOURS) 14 mg/24hr patch Place 14 mg onto the skin daily.   Yes [provider]  Omega-3 Fatty Acids (FISH OIL) 1000 MG CAPS Take 1,000 mg by mouth daily.   Yes [provider]  OXYGEN Inhale 4 L into the lungs continuous.   Yes [provider]  pantoprazole (PROTONIX) 40 MG tablet Take 1 tablet (40 mg total) by mouth daily. 01/14/22  Yes Domenic Polite, MD  Potassium  Chloride (KLOR-CON PO) Take 20 mEq by mouth daily.   Yes [provider]  senna-docusate (SENOKOT-S) 8.6-50 MG tablet Take 2 tablets by mouth at bedtime. Patient taking differently: Take 2 tablets by mouth at bedtime. As needed 01/13/22  Yes Domenic Polite, MD  SMART SENSE THIN LANCETS 26G MISC 1 each by Does not apply route 2 (two) times daily.   Yes [provider]  torsemide (DEMADEX) 20 MG tablet Patient takes 2 tablets once daily.   Yes [provider]  traMADol (ULTRAM) 50 MG tablet Take 1 tablet (50 mg total) by mouth every 6 (six) hours as needed for moderate pain. 01/13/22  Yes Domenic Polite, MD  VITAMIN D PO Take 1,000 Units by mouth daily.   Yes [provider]  Budeson-Glycopyrrol-Formoterol (BREZTRI AEROSPHERE) 160-9-4.8 MCG/ACT AERO Inhale 2 puffs into the lungs in the morning and at bedtime. 01/25/22   Cobb, Karie Schwalbe, NP     Allergies  Lisinopril   Family History   Family History  Problem Relation Age of Onset   Diabetes Mother    Kidney disease  Mother    Hypertension Mother    Other Father        tuberculosis   Hypertension Sister    Hyperlipidemia Sister      Physical Exam  Triage Vital Signs: ED Triage Vitals  Enc Vitals Group     BP      Pulse      Resp      Temp      Temp src      SpO2      Weight      Height      Head Circumference      Peak Flow      Pain Score      Pain Loc      Pain Edu?      Excl. in [place]?     Updated Vital Signs: BP 123/78    Pulse 71    Temp 98.6 F (37 C) (Oral)    Resp (!) 21    Ht _0  (1.676 m)    Wt 73 kg    SpO2 94%    BMI 25.99 kg/m    General: Awake, moderate distress.  CV:  RRR.  Good peripheral perfusion.  Resp:  Increased effort.  Diminished aeration with bibasilar rales. Abd:  Nontender.  No distention.  Other:  2+ nonpitting BLE edema   ED Results / Procedures / Treatments  Labs (all labs ordered are listed, but only abnormal results are displayed) Labs Reviewed  CBC WITH DIFFERENTIAL/PLATELET - Abnormal; Notable for the following components:      Result Value   MCV 100.9 (*)    RDW 16.2 (*)    Platelets 119 (*)    nRBC 0.8 (*)    All other components within normal limits  COMPREHENSIVE METABOLIC PANEL - Abnormal; Notable for the following components:   Glucose, Bld 177 (*)    BUN 41 (*)    Creatinine, Ser 1.82 (*)    Albumin 3.3 (*)    GFR, Estimated 28 (*)    All other components within normal limits  LACTIC ACID, PLASMA - Abnormal; Notable for the following components:   Lactic Acid, Venous 2.0 (*)    All other components within normal limits  BRAIN NATRIURETIC PEPTIDE - Abnormal; Notable for the following components:   B Natriuretic Peptide 599.4 (*)    All other components within normal limits  BLOOD GAS, ARTERIAL - Abnormal; Notable for the following components:   pO2, Arterial 70 (*)    All other components within normal limits  URINALYSIS, ROUTINE W REFLEX MICROSCOPIC - Abnormal; Notable for the following components:   Color, Urine YELLOW  (*)    APPearance CLEAR (*)    All other components within normal limits  TROPONIN I (HIGH SENSITIVITY) - Abnormal; Notable for the following components:   Troponin I (High Sensitivity) 29 (*)    All other components within normal limits  RESP PANEL BY RT-PCR (FLU A&B, COVID) ARPGX2  MAGNESIUM  PROCALCITONIN  LACTIC ACID, PLASMA  TROPONIN I (HIGH SENSITIVITY)     EKG  ED ECG REPORT I, Mandee Pluta J, the attending physician, personally viewed and interpreted this ECG.   Date: 01/25/2022  EKG Time: 2345  Rate: 69  Rhythm: normal sinus rhythm  Axis: Normal  Intervals:none  ST&T Change: Nonspecific    RADIOLOGY I independently interpreted and visualized the chest x-ray as well as the radiology interpretation:  Chest x-ray: Stable pulmonary fibrosis, no acute airspace disease  Official radiology report(s): DG Chest 2 View  Result Date: 01/25/2022 CLINICAL DATA:  Pulmonary fibrosis EXAM: CHEST - 2 VIEW COMPARISON:  01/09/2022 FINDINGS: Frontal and lateral views of the chest demonstrates stable enlargement of the cardiac silhouette. Stable basilar predominant pulmonary fibrosis. No acute airspace disease, effusion, or pneumothorax. No acute bony abnormalities. IMPRESSION: 1. Stable pulmonary fibrosis.  No acute airspace disease. Electronically Signed   By: Randa Ngo M.D.   On: 01/25/2022 23:59   DG Chest Port 1 View  Result Date: 01/25/2022 CLINICAL DATA:  Shortness of breath, respiratory distress EXAM: PORTABLE CHEST 1 VIEW COMPARISON:  01/09/2022 FINDINGS: Single frontal view of the chest demonstrates an enlarged cardiac silhouette. There are chronic changes of pulmonary fibrosis again noted, basilar predominant. No acute airspace disease, effusion, or pneumothorax. No acute bony abnormalities. IMPRESSION: 1. Stable pulmonary fibrosis.  No acute airspace disease. Electronically Signed   By: Randa Ngo M.D.   On: 01/25/2022 23:58     PROCEDURES:  Critical Care  performed: Yes, see critical care procedure note(s)  CRITICAL CARE Performed by: Paulette Blanch   Total critical care time: 45 minutes  Critical care time was exclusive of separately billable procedures and treating other patients.  Critical care was necessary to treat or prevent imminent or life-threatening deterioration.  Critical care was time spent personally by me on the following activities: development of treatment plan with patient and/or surrogate as well as nursing, discussions with consultants, evaluation of patient's response to treatment, examination of patient, obtaining history from patient or surrogate, ordering and performing treatments and interventions, ordering and review of laboratory studies, ordering and review of radiographic studies, pulse oximetry and re-evaluation of patient's condition.   Marland Kitchen1-3 Lead EKG Interpretation Performed by: Paulette Blanch, MD Authorized by: Paulette Blanch, MD     Interpretation: normal     ECG rate:  70   ECG rate assessment: normal     Rhythm: sinus rhythm     Ectopy: none     Conduction: normal   Comments:     Patient placed on cardiac monitor to evaluate for arrhythmias   MEDICATIONS ORDERED IN ED: Medications  pravastatin (PRAVACHOL) tablet 10 mg (10 mg Oral Given 01/26/22 0313)  metoprolol succinate (TOPROL-XL) 24 hr tablet 12.5 mg (has no administration in time range)  torsemide (DEMADEX) tablet 40 mg (has no administration in time range)  DULoxetine (CYMBALTA) DR capsule  30 mg (has no administration in time range)  buPROPion (WELLBUTRIN XL) 24 hr tablet 150 mg (has no administration in time range)  insulin glargine-yfgn (SEMGLEE) injection 10 Units (has no administration in time range)  levothyroxine (SYNTHROID) tablet 75 mcg (has no administration in time range)  pantoprazole (PROTONIX) EC tablet 40 mg (has no administration in time range)  apixaban (ELIQUIS) tablet 5 mg (5 mg Oral Given 01/26/22 0313)  potassium chloride  (KLOR-CON) packet 20 mEq (has no administration in time range)  acetaminophen (TYLENOL) tablet 650 mg (has no administration in time range)    Or  acetaminophen (TYLENOL) suppository 650 mg (has no administration in time range)  ondansetron (ZOFRAN) tablet 4 mg (has no administration in time range)    Or  ondansetron (ZOFRAN) injection 4 mg (has no administration in time range)  methylPREDNISolone sodium succinate (SOLU-MEDROL) 40 mg/mL injection 40 mg (40 mg Intravenous Given 01/26/22 0315)    Followed by  predniSONE (DELTASONE) tablet 40 mg (has no administration in time range)  ipratropium-albuterol (DUONEB) 0.5-2.5 (3) MG/3ML nebulizer solution 3 mL (3 mLs Nebulization Given 01/26/22 0315)  albuterol (PROVENTIL) (2.5 MG/3ML) 0.083% nebulizer solution 2.5 mg (has no administration in time range)  insulin aspart (novoLOG) injection 0-15 Units (has no administration in time range)  insulin aspart (novoLOG) injection 0-5 Units (has no administration in time range)  magnesium sulfate IVPB 2 g 50 mL (0 g Intravenous Stopped 01/26/22 0127)  sodium chloride 0.9 % bolus 500 mL (0 mLs Intravenous Stopped 01/26/22 0301)     IMPRESSION / MDM / ASSESSMENT AND PLAN / ED COURSE  I reviewed the triage vital signs and the nursing notes.                             81 year old female presenting with respiratory stress and hypoxia. Differential includes, but is not limited to, viral syndrome, bronchitis including COPD exacerbation, pneumonia, reactive airway disease including asthma, CHF including exacerbation with or without pulmonary/interstitial edema, pneumothorax, ACS, thoracic trauma, and pulmonary embolism.  I have personally reviewed patient's chart and see that she had an office visit from today with pulmonology for follow-up IPF.  I have also noted that she had a recent hospitalization from 1/18-01/13/2022 for acute on chronic respiratory failure with hypoxia.  The patient is on the cardiac monitor to  evaluate for evidence of arrhythmia and/or significant heart rate changes.  We will obtain sepsis work-up, chest x-ray.  Initiate BiPAP, administer magnesium and additional DuoNeb.  Will reassess.  Anticipate hospitalization.  Clinical Course as of 01/26/22 0322  Thu Jan 26, 2022  0041 Patient looks better on BiPAP, resting more comfortably.  ABG noted with normal pH and CO2.  Chest x-ray demonstrates stable ILD.  Pending rest of lab results then will consult hospitalist services for admission. [JS]  [id number] Laboratory results demonstrate thrombocytopenia, platelets 119, AKI BUN 41/creatinine 1.82, mildly elevated troponin 29 which is likely secondary to demand ischemia.  At this time will consult hospitalist services for evaluation and admission. [JS]  0108 Noted lactic acid 2.0.  Will obtain oral temperature; if febrile will initiate IV antibiotics.  If afebrile, I am disinclined to start IV antibiotics as there is no evidence of infiltrate on chest x-ray.  Will check procalcitonin for further delineation. [JS]    Clinical Course User Index [JS] Paulette Blanch, MD     FINAL CLINICAL IMPRESSION(S) / ED DIAGNOSES   Final diagnoses:  Respiratory distress  Hypoxia  ILD (interstitial lung disease) (HCC)  Acute on chronic congestive heart failure, unspecified heart failure type (HCC)  Chronic obstructive pulmonary disease with acute exacerbation (HCC)  AKI (acute kidney injury) ([place])  Thrombocytopenia ([place])     Rx / DC Orders   ED Discharge Orders     None        Note:  This document was prepared using Dragon voice recognition software and may include unintentional dictation errors.   Paulette Blanch, MD 01/26/22 [phone]

## 2022-01-26 ENCOUNTER — Other Ambulatory Visit: Payer: Self-pay

## 2022-01-26 ENCOUNTER — Encounter: Payer: Self-pay | Admitting: Internal Medicine

## 2022-01-26 DIAGNOSIS — I509 Heart failure, unspecified: Secondary | ICD-10-CM

## 2022-01-26 DIAGNOSIS — Z888 Allergy status to other drugs, medicaments and biological substances status: Secondary | ICD-10-CM | POA: Diagnosis not present

## 2022-01-26 DIAGNOSIS — Z515 Encounter for palliative care: Secondary | ICD-10-CM

## 2022-01-26 DIAGNOSIS — R0902 Hypoxemia: Secondary | ICD-10-CM

## 2022-01-26 DIAGNOSIS — D696 Thrombocytopenia, unspecified: Secondary | ICD-10-CM

## 2022-01-26 DIAGNOSIS — J9621 Acute and chronic respiratory failure with hypoxia: Secondary | ICD-10-CM | POA: Diagnosis present

## 2022-01-26 DIAGNOSIS — Z8616 Personal history of COVID-19: Secondary | ICD-10-CM | POA: Diagnosis not present

## 2022-01-26 DIAGNOSIS — I4891 Unspecified atrial fibrillation: Secondary | ICD-10-CM | POA: Diagnosis not present

## 2022-01-26 DIAGNOSIS — Z833 Family history of diabetes mellitus: Secondary | ICD-10-CM | POA: Diagnosis not present

## 2022-01-26 DIAGNOSIS — I2721 Secondary pulmonary arterial hypertension: Secondary | ICD-10-CM | POA: Diagnosis present

## 2022-01-26 DIAGNOSIS — J441 Chronic obstructive pulmonary disease with (acute) exacerbation: Principal | ICD-10-CM

## 2022-01-26 DIAGNOSIS — E039 Hypothyroidism, unspecified: Secondary | ICD-10-CM | POA: Diagnosis present

## 2022-01-26 DIAGNOSIS — F17210 Nicotine dependence, cigarettes, uncomplicated: Secondary | ICD-10-CM | POA: Diagnosis present

## 2022-01-26 DIAGNOSIS — N179 Acute kidney failure, unspecified: Secondary | ICD-10-CM | POA: Diagnosis present

## 2022-01-26 DIAGNOSIS — R7989 Other specified abnormal findings of blood chemistry: Secondary | ICD-10-CM

## 2022-01-26 DIAGNOSIS — R0603 Acute respiratory distress: Secondary | ICD-10-CM | POA: Diagnosis present

## 2022-01-26 DIAGNOSIS — Z841 Family history of disorders of kidney and ureter: Secondary | ICD-10-CM | POA: Diagnosis not present

## 2022-01-26 DIAGNOSIS — I5032 Chronic diastolic (congestive) heart failure: Secondary | ICD-10-CM | POA: Diagnosis present

## 2022-01-26 DIAGNOSIS — N1832 Chronic kidney disease, stage 3b: Secondary | ICD-10-CM | POA: Diagnosis present

## 2022-01-26 DIAGNOSIS — J849 Interstitial pulmonary disease, unspecified: Secondary | ICD-10-CM | POA: Diagnosis not present

## 2022-01-26 DIAGNOSIS — E785 Hyperlipidemia, unspecified: Secondary | ICD-10-CM | POA: Diagnosis present

## 2022-01-26 DIAGNOSIS — Z20822 Contact with and (suspected) exposure to covid-19: Secondary | ICD-10-CM | POA: Diagnosis present

## 2022-01-26 DIAGNOSIS — J84112 Idiopathic pulmonary fibrosis: Secondary | ICD-10-CM | POA: Diagnosis present

## 2022-01-26 DIAGNOSIS — E1122 Type 2 diabetes mellitus with diabetic chronic kidney disease: Secondary | ICD-10-CM | POA: Diagnosis present

## 2022-01-26 DIAGNOSIS — G4733 Obstructive sleep apnea (adult) (pediatric): Secondary | ICD-10-CM | POA: Diagnosis present

## 2022-01-26 DIAGNOSIS — I130 Hypertensive heart and chronic kidney disease with heart failure and stage 1 through stage 4 chronic kidney disease, or unspecified chronic kidney disease: Secondary | ICD-10-CM | POA: Diagnosis present

## 2022-01-26 DIAGNOSIS — Z7901 Long term (current) use of anticoagulants: Secondary | ICD-10-CM | POA: Diagnosis not present

## 2022-01-26 DIAGNOSIS — Z7189 Other specified counseling: Secondary | ICD-10-CM | POA: Diagnosis not present

## 2022-01-26 DIAGNOSIS — Z86711 Personal history of pulmonary embolism: Secondary | ICD-10-CM | POA: Diagnosis not present

## 2022-01-26 DIAGNOSIS — I480 Paroxysmal atrial fibrillation: Secondary | ICD-10-CM | POA: Diagnosis present

## 2022-01-26 LAB — COMPREHENSIVE METABOLIC PANEL
ALT: 22 U/L (ref 0–44)
AST: 29 U/L (ref 15–41)
Albumin: 3.3 g/dL — ABNORMAL LOW (ref 3.5–5.0)
Alkaline Phosphatase: 71 U/L (ref 38–126)
Anion gap: 8 (ref 5–15)
BUN: 41 mg/dL — ABNORMAL HIGH (ref 8–23)
CO2: 26 mmol/L (ref 22–32)
Calcium: 9 mg/dL (ref 8.9–10.3)
Chloride: 105 mmol/L (ref 98–111)
Creatinine, Ser: 1.82 mg/dL — ABNORMAL HIGH (ref 0.44–1.00)
GFR, Estimated: 28 mL/min — ABNORMAL LOW (ref >60)
Glucose, Bld: 177 mg/dL — ABNORMAL HIGH (ref 70–99)
Potassium: 4.3 mmol/L (ref 3.5–5.1)
Sodium: 139 mmol/L (ref 135–145)
Total Bilirubin: 0.9 mg/dL (ref 0.3–1.2)
Total Protein: 7.2 g/dL (ref 6.5–8.1)

## 2022-01-26 LAB — CBC WITH DIFFERENTIAL/PLATELET
Abs Immature Granulocytes: 0.04 10*3/uL (ref 0.00–0.07)
Basophils Absolute: 0.1 10*3/uL (ref 0.0–0.1)
Basophils Relative: 1 %
Eosinophils Absolute: 0.1 10*3/uL (ref 0.0–0.5)
Eosinophils Relative: 1 %
HCT: 44.2 % (ref 36.0–46.0)
Hemoglobin: 14.3 g/dL (ref 12.0–15.0)
Immature Granulocytes: 0 %
Lymphocytes Relative: 24 %
Lymphs Abs: 2.4 10*3/uL (ref 0.7–4.0)
MCH: 32.6 pg (ref 26.0–34.0)
MCHC: 32.4 g/dL (ref 30.0–36.0)
MCV: 100.9 fL — ABNORMAL HIGH (ref 80.0–100.0)
Monocytes Absolute: 0.6 10*3/uL (ref 0.1–1.0)
Monocytes Relative: 7 %
Neutro Abs: 6.5 10*3/uL (ref 1.7–7.7)
Neutrophils Relative %: 67 %
Platelets: 119 10*3/uL — ABNORMAL LOW (ref 150–400)
RBC: 4.38 MIL/uL (ref 3.87–5.11)
RDW: 16.2 % — ABNORMAL HIGH (ref 11.5–15.5)
WBC: 9.8 10*3/uL (ref 4.0–10.5)
nRBC: 0.8 % — ABNORMAL HIGH (ref 0.0–0.2)

## 2022-01-26 LAB — URINALYSIS, ROUTINE W REFLEX MICROSCOPIC
Bilirubin Urine: NEGATIVE
Glucose, UA: NEGATIVE mg/dL
Hgb urine dipstick: NEGATIVE
Ketones, ur: NEGATIVE mg/dL
Leukocytes,Ua: NEGATIVE
Nitrite: NEGATIVE
Protein, ur: NEGATIVE mg/dL
Specific Gravity, Urine: 1.008 (ref 1.005–1.030)
pH: 5 (ref 5.0–8.0)

## 2022-01-26 LAB — BLOOD GAS, ARTERIAL
Acid-Base Excess: 1.7 mmol/L (ref 0.0–2.0)
Bicarbonate: 25.8 mmol/L (ref 20.0–28.0)
FIO2: 100 %
O2 Content: 15 L/min
O2 Saturation: 94.8 %
Patient temperature: 37
pCO2 arterial: 38 mmHg (ref 32–48)
pH, Arterial: 7.44 (ref 7.35–7.45)
pO2, Arterial: 70 mmHg — ABNORMAL LOW (ref 83–108)

## 2022-01-26 LAB — MAGNESIUM: Magnesium: 2.1 mg/dL (ref 1.7–2.4)

## 2022-01-26 LAB — LACTIC ACID, PLASMA
Lactic Acid, Venous: 2 mmol/L (ref 0.5–1.9)
Lactic Acid, Venous: 1.3 mmol/L (ref 0.5–1.9)

## 2022-01-26 LAB — PROCALCITONIN: Procalcitonin: <0.1 ng/mL

## 2022-01-26 LAB — RESP PANEL BY RT-PCR (FLU A&B, COVID) ARPGX2
Influenza A by PCR: NEGATIVE
Influenza B by PCR: NEGATIVE
SARS Coronavirus 2 by RT PCR: NEGATIVE

## 2022-01-26 LAB — GLUCOSE, CAPILLARY
Glucose-Capillary: 196 mg/dL — ABNORMAL HIGH (ref 70–99)
Glucose-Capillary: 285 mg/dL — ABNORMAL HIGH (ref 70–99)
Glucose-Capillary: 185 mg/dL — ABNORMAL HIGH (ref 70–99)

## 2022-01-26 LAB — TROPONIN I (HIGH SENSITIVITY)
Troponin I (High Sensitivity): 29 ng/L — ABNORMAL HIGH (ref <18)
Troponin I (High Sensitivity): 31 ng/L — ABNORMAL HIGH (ref <18)

## 2022-01-26 LAB — BRAIN NATRIURETIC PEPTIDE: B Natriuretic Peptide: 599.4 pg/mL — ABNORMAL HIGH (ref 0.0–100.0)

## 2022-01-26 LAB — MRSA NEXT GEN BY PCR, NASAL: MRSA by PCR Next Gen: NOT DETECTED

## 2022-01-26 LAB — CBG MONITORING, ED: Glucose-Capillary: 269 mg/dL — ABNORMAL HIGH (ref 70–99)

## 2022-01-26 MED ORDER — DULOXETINE HCL 30 MG PO CPEP
30.0000 mg | ORAL_CAPSULE | Freq: Every day | ORAL | Status: DC
  Administered 2022-01-26 – 2022-01-28 (×3): 30 mg via ORAL
  Filled 2022-01-26 (×3): qty 1

## 2022-01-26 MED ORDER — LEVOTHYROXINE SODIUM 50 MCG PO TABS
75.0000 ug | ORAL_TABLET | Freq: Every day | ORAL | Status: DC
  Administered 2022-01-26 – 2022-01-28 (×3): 75 ug via ORAL
  Filled 2022-01-26: qty 1
  Filled 2022-01-26: qty 2
  Filled 2022-01-26: qty 1

## 2022-01-26 MED ORDER — POTASSIUM CHLORIDE 20 MEQ PO PACK
20.0000 meq | PACK | Freq: Every day | ORAL | Status: DC
  Administered 2022-01-26 – 2022-01-28 (×3): 20 meq via ORAL
  Filled 2022-01-26 (×3): qty 1

## 2022-01-26 MED ORDER — PREDNISONE 20 MG PO TABS
40.0000 mg | ORAL_TABLET | Freq: Every day | ORAL | Status: DC
  Administered 2022-01-27 – 2022-01-28 (×2): 40 mg via ORAL
  Filled 2022-01-26 (×2): qty 2

## 2022-01-26 MED ORDER — METHYLPREDNISOLONE SODIUM SUCC 40 MG IJ SOLR
40.0000 mg | Freq: Two times a day (BID) | INTRAMUSCULAR | Status: AC
  Administered 2022-01-26 (×2): 40 mg via INTRAVENOUS
  Filled 2022-01-26 (×2): qty 1

## 2022-01-26 MED ORDER — INSULIN GLARGINE-YFGN 100 UNIT/ML ~~LOC~~ SOLN
10.0000 [IU] | Freq: Every day | SUBCUTANEOUS | Status: DC
Start: 2022-01-26 — End: 2022-01-28
  Administered 2022-01-26 – 2022-01-28 (×3): 10 [IU] via SUBCUTANEOUS
  Filled 2022-01-26 (×3): qty 0.1

## 2022-01-26 MED ORDER — ACETAMINOPHEN 650 MG RE SUPP: 650.0000 mg | Freq: Four times a day (QID) | RECTAL | Status: DC | PRN

## 2022-01-26 MED ORDER — INSULIN ASPART 100 UNIT/ML IJ SOLN
0.0000 [IU] | Freq: Every day | INTRAMUSCULAR | Status: DC
  Administered 2022-01-27: 2 [IU] via SUBCUTANEOUS
  Filled 2022-01-26: qty 1

## 2022-01-26 MED ORDER — ACETAMINOPHEN 325 MG PO TABS
650.0000 mg | ORAL_TABLET | Freq: Four times a day (QID) | ORAL | Status: DC | PRN
  Administered 2022-01-27: 650 mg via ORAL
  Filled 2022-01-26: qty 2

## 2022-01-26 MED ORDER — INSULIN ASPART 100 UNIT/ML IJ SOLN
0.0000 [IU] | Freq: Three times a day (TID) | INTRAMUSCULAR | Status: DC
  Administered 2022-01-26: 8 [IU] via SUBCUTANEOUS
  Administered 2022-01-26: 3 [IU] via SUBCUTANEOUS
  Administered 2022-01-26: 8 [IU] via SUBCUTANEOUS
  Administered 2022-01-27: 5 [IU] via SUBCUTANEOUS
  Administered 2022-01-27: 2 [IU] via SUBCUTANEOUS
  Administered 2022-01-27 – 2022-01-28 (×2): 3 [IU] via SUBCUTANEOUS
  Filled 2022-01-26 (×6): qty 1

## 2022-01-26 MED ORDER — ONDANSETRON HCL 4 MG/2ML IJ SOLN
4.0000 mg | Freq: Four times a day (QID) | INTRAMUSCULAR | Status: DC | PRN
Start: 2022-01-26 — End: 2022-01-28

## 2022-01-26 MED ORDER — ONDANSETRON HCL 4 MG PO TABS: 4.0000 mg | ORAL_TABLET | Freq: Four times a day (QID) | ORAL | Status: DC | PRN

## 2022-01-26 MED ORDER — SODIUM CHLORIDE 0.9 % IV BOLUS
500.0000 mL | Freq: Once | INTRAVENOUS | Status: AC
  Administered 2022-01-26: 500 mL via INTRAVENOUS

## 2022-01-26 MED ORDER — METOPROLOL SUCCINATE ER 25 MG PO TB24
12.5000 mg | ORAL_TABLET | Freq: Every day | ORAL | Status: DC
  Filled 2022-01-26: qty 0.5

## 2022-01-26 MED ORDER — BUPROPION HCL ER (XL) 150 MG PO TB24
150.0000 mg | ORAL_TABLET | Freq: Every day | ORAL | Status: DC
  Administered 2022-01-26 – 2022-01-28 (×3): 150 mg via ORAL
  Filled 2022-01-26 (×3): qty 1

## 2022-01-26 MED ORDER — ORAL CARE MOUTH RINSE
15.0000 mL | Freq: Two times a day (BID) | OROMUCOSAL | Status: DC
  Administered 2022-01-27 – 2022-01-28 (×3): 15 mL via OROMUCOSAL

## 2022-01-26 MED ORDER — APIXABAN 5 MG PO TABS
5.0000 mg | ORAL_TABLET | Freq: Two times a day (BID) | ORAL | Status: DC
  Administered 2022-01-26 – 2022-01-28 (×6): 5 mg via ORAL
  Filled 2022-01-26 (×6): qty 1

## 2022-01-26 MED ORDER — ALBUTEROL SULFATE (2.5 MG/3ML) 0.083% IN NEBU: 2.5000 mg | INHALATION_SOLUTION | RESPIRATORY_TRACT | Status: DC | PRN

## 2022-01-26 MED ORDER — PANTOPRAZOLE SODIUM 40 MG PO TBEC
40.0000 mg | DELAYED_RELEASE_TABLET | Freq: Every day | ORAL | Status: DC
  Administered 2022-01-26 – 2022-01-28 (×3): 40 mg via ORAL
  Filled 2022-01-26 (×3): qty 1

## 2022-01-26 MED ORDER — TORSEMIDE 20 MG PO TABS
40.0000 mg | ORAL_TABLET | Freq: Every day | ORAL | Status: DC
  Administered 2022-01-26 – 2022-01-28 (×3): 40 mg via ORAL
  Filled 2022-01-26 (×3): qty 2

## 2022-01-26 MED ORDER — IPRATROPIUM-ALBUTEROL 0.5-2.5 (3) MG/3ML IN SOLN
3.0000 mL | Freq: Four times a day (QID) | RESPIRATORY_TRACT | Status: DC
  Administered 2022-01-26 (×3): 3 mL via RESPIRATORY_TRACT
  Filled 2022-01-26 (×3): qty 3

## 2022-01-26 MED ORDER — PRAVASTATIN SODIUM 20 MG PO TABS
10.0000 mg | ORAL_TABLET | Freq: Every day | ORAL | Status: DC
  Administered 2022-01-26: 10 mg via ORAL
  Filled 2022-01-26: qty 1

## 2022-01-26 NOTE — Final Progress Note (Signed)
Same day rounding progress note  Patient seen and examined while in the ED.  Please see Dr. Josefine Class dictated history and physical for further details.  I agree with her assessment and plan.  Patient remains on BiPAP and critically sick.  She is followed by ILD clinic with Dr. [name] -I have made them aware She also follows with Dr. Loralie Champagne from cardiology  Goals of care: She is followed by palliative care and I have requested their evaluation again considering overall poor prognosis and multiple hospitalizations.  She has had 3 readmissions in last 6 months.  Time spent: 20 minutes

## 2022-01-26 NOTE — Progress Notes (Signed)
CXR with stable pulmonary fibrosis. No evidence of pulmonary edema or other acute process.

## 2022-01-26 NOTE — Progress Notes (Signed)
Spoke with Dr. Manuella Ghazi and Mariella Saa prevention specialist and both okayed patient to no longer be on isolation.

## 2022-01-26 NOTE — Progress Notes (Signed)
[facility]) Hospital Liaison note:  This patient is currently enrolled in [facility] outpatient-based Palliative Care. Will continue to follow for disposition.  Please call with any outpatient palliative questions or concerns.  Thank you, Lorelee Market, LPN [facility] Liaison [phone]

## 2022-01-26 NOTE — Progress Notes (Signed)
Sent Dr. Damita Dunnings secure chat and let MD know that patient c/o burning when she voids and states this has been going on for 2 weeks. Waiting for reply. UA done today in lab results.

## 2022-01-26 NOTE — H&P (Signed)
History and Physical    Patient: Elizabeth Melton:[id number] DOB: Oct 22, 1941 DOA: 01/25/2022 DOS: the patient was seen and examined on 01/26/2022 PCP: Kathyrn Lass, MD  Patient coming from: Home  Chief Complaint:  Chief Complaint  Patient presents with   Respiratory Distress    HPI: Elizabeth Melton is a 81 y.o. female with medical history significant of COPD, interstitial lung disease, severe pulmonary hypertension, chronic respiratory failure on 4 to 5 L home O2, diastolic CHF, DM, CKD 3B, PAD, HTN, hypothyroidism, OSA on CPAP, paroxysmal A-fib and history of PE on Eliquis, recently hospitalized from 1/18 to 2/3 with acute on chronic respiratory failure who returns [facility] with respiratory distress.  EMS reports patient was saturating at 59% on flow rate at 5 L and she arrived on NRB satting at 100%.  She denied chest pain, fever or chills  ED course: On arrival, afebrile pulse 69, BP 127/57.  O2 sat 95% on NRB and patient transitioned to BiPAP Blood work ABG on 100% FiO2 with pH 7.44, PCO2 38 and PO2 70 CBC notable for thrombocytopenia of 119, down from 197 a couple weeks prior and CMP mostly unremarkable except for creatinine of 1.82 above baseline of 1.2 Lactic acid 2.0, troponin 29, downtrending from a peak of 82 a couple months ago, BNP 599  EKG: NSR at 69 with nonspecific ST-T wave changes Chest x-ray with stable pulmonary fibrosis and no acute airspace disease  Patient given IV magnesium and NS bolus.  Hospitalist consulted for admission.   Review of Systems: unable to review all systems due to the inability of the patient to answer questions.Due to being on Bipap and shortness of breath Past Medical History:  Diagnosis Date   Acute on chronic diastolic (congestive) heart failure ([place]) 05/19/2021   Acute respiratory failure with hypoxia (HCC) 09/06/2020   Atrial fibrillation with RVR (HCC)    Diabetes mellitus without complication (HCC)     Hyperlipidemia    Hypertension    Hypothyroidism    IPF (idiopathic pulmonary fibrosis) ([place])    Pneumonia due to COVID-19 virus 01/16/2021   Thrombocytopenia [facility])    Past Surgical History:  Procedure Laterality Date   RIGHT HEART CATH N/A 07/05/2021   Procedure: RIGHT HEART CATH;  Surgeon: [name], MD;  Location: [facility];  Service: Cardiovascular;  Laterality: N/A;   RIGHT HEART CATH N/A 01/06/2022   Procedure: RIGHT HEART CATH;  Surgeon: Larey Dresser, MD;  Location: [facility];  Service: Cardiovascular;  Laterality: N/A;   RIGHT/LEFT HEART CATH AND CORONARY ANGIOGRAPHY N/A 11/23/2021   Procedure: RIGHT/LEFT HEART CATH AND CORONARY ANGIOGRAPHY;  Surgeon: Larey Dresser, MD;  Location: [facility];  Service: Cardiovascular;  Laterality: N/A;   TUBAL LIGATION     Social History:  reports that she has been smoking cigarettes. She started smoking about 63 years ago. She has a 60.00 pack-year smoking history. She quit smokeless tobacco use about 59 years ago. She reports current alcohol use. She reports that she does not use drugs.  Allergies  Allergen Reactions   Lisinopril Other (See Comments)    Other reaction(s): cough 09/20/17    Family History  Problem Relation Age of Onset   Diabetes Mother    Kidney disease Mother    Hypertension Mother    Other Father        tuberculosis   Hypertension Sister    Hyperlipidemia Sister  Prior to Admission medications   Medication Sig Start Date End Date Taking? Authorizing Provider  acetaminophen (TYLENOL) 500 MG tablet Take 1,000 mg by mouth every 8 (eight) hours as needed for headache or mild pain (pain).   Yes [provider]  albuterol (PROVENTIL) (2.5 MG/3ML) 0.083% nebulizer solution Take 3 mLs (2.5 mg total) by nebulization every 4 (four) hours as needed for wheezing or shortness of breath. 01/13/22  Yes Domenic Polite, MD  apixaban (ELIQUIS) 5 MG TABS tablet Take 1 tablet (5 mg total)  by mouth 2 (two) times daily. 11/24/21  Yes Larey Dresser, MD  b complex vitamins tablet Take 1 tablet by mouth daily.   Yes [provider]  bisacodyl (DULCOLAX) 10 MG suppository Place 10 mg rectally daily as needed for moderate constipation.   Yes [provider]  Blood Glucose Monitoring Suppl (TRUE METRIX METER) w/Device KIT 1 each by Other route in the morning and at bedtime. 12/25/20  Yes [provider]  Budeson-Glycopyrrol-Formoterol (BREZTRI AEROSPHERE) 160-9-4.8 MCG/ACT AERO Inhale 2 puffs into the lungs in the morning and at bedtime. 01/25/22  Yes Cobb, Karie Schwalbe, NP  buPROPion (WELLBUTRIN XL) 150 MG 24 hr tablet Take 1 tablet (150 mg total) by mouth daily. 12/20/21  Yes Larey Dresser, MD  Calcium Carb-Cholecalciferol (CALCIUM + D3) 600-200 MG-UNIT TABS Take 2 tablets by mouth daily.   Yes [provider]  diphenhydrAMINE (BENADRYL) 25 MG tablet Take 25 mg by mouth every 6 (six) hours as needed for allergies.   Yes [provider]  DULoxetine (CYMBALTA) 30 MG capsule Take 1 capsule (30 mg total) by mouth daily. 01/14/22  Yes Domenic Polite, MD  glucose blood test strip 1 each by Other route as needed for other (blood sugar).   Yes [provider]  guaiFENesin (MUCINEX) 600 MG 12 hr tablet Take 1 tablet (600 mg total) by mouth 2 (two) times daily. Patient taking differently: Take 600 mg by mouth 2 (two) times daily. As needed 01/13/22  Yes Domenic Polite, MD  insulin glargine (LANTUS) 100 UNIT/ML injection Inject 10 Units into the skin daily.   Yes [provider]  Insulin Pen Needle (PEN NEEDLES 29GX1/2") 29G X 12MM MISC For insulin injection 05/22/21  Yes [name], MD  levothyroxine (SYNTHROID, LEVOTHROID) 75 MCG tablet Take 75 mcg by mouth daily before breakfast.    Yes [provider]  lidocaine (LIDODERM) 5 % Place 1 patch onto the skin at bedtime. Remove & Discard patch within 12 hours or as directed by MD  01/13/22  Yes Domenic Polite, MD  lovastatin (MEVACOR) 40 MG tablet Take 40 mg by mouth at bedtime.   Yes [provider]  methocarbamol 1000 MG TABS Take 1,000 mg by mouth every 8 (eight) hours as needed for muscle spasms. 01/13/22  Yes Domenic Polite, MD  metoprolol succinate (TOPROL-XL) 25 MG 24 hr tablet Take 0.5 tablets (12.5 mg total) by mouth daily. 01/28/21  Yes [name], MD  nicotine (NICODERM CQ - DOSED IN MG/24 HOURS) 14 mg/24hr patch Place 14 mg onto the skin daily.   Yes [provider]  Omega-3 Fatty Acids (FISH OIL) 1000 MG CAPS Take 1,000 mg by mouth daily.   Yes [provider]  OXYGEN Inhale 4 L into the lungs continuous.   Yes [provider]  pantoprazole (PROTONIX) 40 MG tablet Take 1 tablet (40 mg total) by mouth daily. 01/14/22  Yes Domenic Polite, MD  Potassium Chloride (KLOR-CON PO)  Take 20 mEq by mouth daily.   Yes [provider]  senna-docusate (SENOKOT-S) 8.6-50 MG tablet Take 2 tablets by mouth at bedtime. Patient taking differently: Take 2 tablets by mouth at bedtime. As needed 01/13/22  Yes Domenic Polite, MD  SMART SENSE THIN LANCETS 26G MISC 1 each by Does not apply route 2 (two) times daily.   Yes [provider]  torsemide (DEMADEX) 20 MG tablet Patient takes 2 tablets once daily.   Yes [provider]  traMADol (ULTRAM) 50 MG tablet Take 1 tablet (50 mg total) by mouth every 6 (six) hours as needed for moderate pain. 01/13/22  Yes Domenic Polite, MD  VITAMIN D PO Take 1,000 Units by mouth daily.   Yes [provider]  Budeson-Glycopyrrol-Formoterol (BREZTRI AEROSPHERE) 160-9-4.8 MCG/ACT AERO Inhale 2 puffs into the lungs in the morning and at bedtime. 01/25/22   Clayton Bibles, NP    Physical Exam: Vitals:   01/25/22 2344 01/25/22 2345 01/26/22 0005 01/26/22 0054  BP: (!) 127/57   135/74  Pulse: 69  64 68  Resp: 17  17 (!) 21  Temp: 98.6 F (37 C)     TempSrc: Oral     SpO2: 99%   99% 93%  Weight:  73 kg    Height:  _0  (1.676 m)     Physical Exam Vitals and nursing note reviewed.  Constitutional:      General: She is not in acute distress.    Appearance: Normal appearance. She is ill-appearing.  HENT:     Head: Normocephalic and atraumatic.  Cardiovascular:     Rate and Rhythm: Regular rhythm. Tachycardia present.     Pulses: Normal pulses.     Heart sounds: Normal heart sounds. No murmur heard. Pulmonary:     Effort: Pulmonary effort is normal. Tachypnea and prolonged expiration present.     Breath sounds: Decreased air movement present. No transmitted upper airway sounds. Wheezing and rhonchi present.  Abdominal:     General: Bowel sounds are normal.     Palpations: Abdomen is soft.     Tenderness: There is no abdominal tenderness.  Musculoskeletal:        General: No swelling or tenderness. Normal range of motion.     Cervical back: Normal range of motion and neck supple.  Skin:    General: Skin is warm and dry.  Neurological:     General: No focal deficit present.     Mental Status: Mental status is at baseline. She is lethargic.  Psychiatric:        Mood and Affect: Mood normal.        Behavior: Behavior normal.     Data Reviewed: Notes from primary care and specialist visits, past discharge summaries. Prior diagnostic testing as applicable to current admission diagnoses Updated medications and problem lists for reconciliation ED course, including vitals, labs, imaging, treatment and response to treatment Triage notes and ED providers notes   Assessment and Plan:  Acute on chronic respiratory failure with hypoxia ([place])- (present on admission) COPD exacerbation Interstitial lung disease Severe pulmonary artery hypertension -BiPAP to wean as tolerated to home O2 -Scheduled and as needed nebulized bronchodilators -IV steroids   Chronic diastolic heart failure Severe pulmonary artery hypertension -Recent right heart cath on  11/23/2021 showed normal filling pressures, severe PAH and with low cardiac output.  Plan was to add Tyvaso as an outpatient. -Echo in 10/2021 showed EF of 60 to 65% with moderate TR, PASP of  85 mmHg -right heart catheterization 1/27 showed severe ongoing pulmonary hypertension.  - Not acutely exacerbated - Continue home torsemide, metoprolol    AKI superimposed on CKD 3B (chronic kidney disease) -Received a fluid bolus in the ED - Monitor renal function and avoid nephrotoxins  Thrombocytopenia  - Platelets 119,000, down from 197,000 - We will continue Eliquis for now  Elevated lactic acid level - Sepsis not suspected at this time, likely related to respiratory distress    AF (paroxysmal atrial fibrillation) ([place])- (present on admission) -Stable, continue toprol-xl and eliquis     Type 2 diabetes mellitus with hyperglycemia ([place])- (present on admission) a1c in 10/2021: 7.6 Continue lantus 10 units daily and moderate SSI   Essential (primary) hypertension- (present on admission) -Blood pressure mildly elevated -Continue home medication: toprol-xl 12.61m daily    Hyperlipidemia/PAD- (present on admission) -Continue mevacor   Hypothyroidism- (present on admission) -Last TSH 05/2021 and wnl -Continue home synthroid   OSA -CPAP QHS s    Advance Care Planning:   Code Status: Prior   Consults: none  Family Communication: none  Severity of Illness: The appropriate patient status for this patient is INPATIENT. Inpatient status is judged to be reasonable and necessary in order to provide the required intensity of service to ensure the patient's safety. The patient's presenting symptoms, physical exam findings, and initial radiographic and laboratory data in the context of their chronic comorbidities is felt to place them at high risk for further clinical deterioration. Furthermore, it is not anticipated that the patient will be medically stable for discharge from the hospital  within 2 midnights of admission.   * I certify that at the point of admission it is my clinical judgment that the patient will require inpatient hospital care spanning beyond 2 midnights from the point of admission due to high intensity of service, high risk for further deterioration and high frequency of surveillance required.*  Author: HAthena Masse MD 01/26/2022 2:01 AM  For on call review [email or web address]

## 2022-01-26 NOTE — Progress Notes (Signed)
COMMUNITY PALLIATIVE CARE SW NOTE  PATIENT NAME: Elizabeth Melton DOB: Jan 26, 1941 MRN: [id number]  PRIMARY CARE PROVIDER: Kathyrn Lass, MD  RESPONSIBLE PARTY:  Acct ID - Guarantor Home Phone Work Phone Relationship Acct Type  [id number] Ernestene [phone]  Self P/F     [address], [place], [place] [id number]   Due to the COVID-19 crisis, this virtual check-in visit was done via telephone from my office and it was initiated and consent by this patient and or family.  SOCIAL WORK TELEPHONIC ENCOUNTER (10:15 am-10: 30 am)  PC SW completed a telephonic encounter with patient's daughter. She advised that patient is currently hospitalized. The daughter stated that patient was admitted from the SNF, who is now requesting that she pay a fee to hold patient's bed. The goal was for patient to return home upon discharge, but is not sure what to do. She is hoping that patient can get therapy at home. SW advised her that therapy at home is possible, based on patient status at time of discharge. The daughter advised that she would like to talk to the nurse about this because she want to make the right decisions for her mother. SW advised her that she will update the hospital liaison team who will follow her mom while she is in the hospital. SW also advised that she will have the RN call her back to discuss further. No other concerns noted.   [address] [place], [place]

## 2022-01-27 DIAGNOSIS — N179 Acute kidney failure, unspecified: Secondary | ICD-10-CM

## 2022-01-27 DIAGNOSIS — I4891 Unspecified atrial fibrillation: Secondary | ICD-10-CM

## 2022-01-27 DIAGNOSIS — Z515 Encounter for palliative care: Secondary | ICD-10-CM

## 2022-01-27 DIAGNOSIS — I10 Essential (primary) hypertension: Secondary | ICD-10-CM

## 2022-01-27 DIAGNOSIS — J9621 Acute and chronic respiratory failure with hypoxia: Secondary | ICD-10-CM

## 2022-01-27 DIAGNOSIS — R7989 Other specified abnormal findings of blood chemistry: Secondary | ICD-10-CM

## 2022-01-27 DIAGNOSIS — J849 Interstitial pulmonary disease, unspecified: Secondary | ICD-10-CM

## 2022-01-27 DIAGNOSIS — E039 Hypothyroidism, unspecified: Secondary | ICD-10-CM

## 2022-01-27 DIAGNOSIS — Z7189 Other specified counseling: Secondary | ICD-10-CM

## 2022-01-27 DIAGNOSIS — N1832 Chronic kidney disease, stage 3b: Secondary | ICD-10-CM

## 2022-01-27 LAB — BASIC METABOLIC PANEL
Anion gap: 7 (ref 5–15)
BUN: 35 mg/dL — ABNORMAL HIGH (ref 8–23)
CO2: 26 mmol/L (ref 22–32)
Calcium: 9.2 mg/dL (ref 8.9–10.3)
Chloride: 106 mmol/L (ref 98–111)
Creatinine, Ser: 1.23 mg/dL — ABNORMAL HIGH (ref 0.44–1.00)
GFR, Estimated: 45 mL/min — ABNORMAL LOW (ref >60)
Glucose, Bld: 150 mg/dL — ABNORMAL HIGH (ref 70–99)
Potassium: 4.5 mmol/L (ref 3.5–5.1)
Sodium: 139 mmol/L (ref 135–145)

## 2022-01-27 LAB — CBC
HCT: 42.4 % (ref 36.0–46.0)
Hemoglobin: 13.8 g/dL (ref 12.0–15.0)
MCH: 32.2 pg (ref 26.0–34.0)
MCHC: 32.5 g/dL (ref 30.0–36.0)
MCV: 99.1 fL (ref 80.0–100.0)
Platelets: 129 10*3/uL — ABNORMAL LOW (ref 150–400)
RBC: 4.28 MIL/uL (ref 3.87–5.11)
RDW: 16.2 % — ABNORMAL HIGH (ref 11.5–15.5)
WBC: 12.3 10*3/uL — ABNORMAL HIGH (ref 4.0–10.5)
nRBC: 0.6 % — ABNORMAL HIGH (ref 0.0–0.2)

## 2022-01-27 LAB — GLUCOSE, CAPILLARY
Glucose-Capillary: 182 mg/dL — ABNORMAL HIGH (ref 70–99)
Glucose-Capillary: 145 mg/dL — ABNORMAL HIGH (ref 70–99)
Glucose-Capillary: 226 mg/dL — ABNORMAL HIGH (ref 70–99)
Glucose-Capillary: 232 mg/dL — ABNORMAL HIGH (ref 70–99)

## 2022-01-27 NOTE — Assessment & Plan Note (Addendum)
Due to COPD exacerbation with underlying ILD.  She is off BiPAP.  Weaned off to 7 L high flow nasal cannula at this time

## 2022-01-27 NOTE — Assessment & Plan Note (Signed)
Continue torsemide

## 2022-01-27 NOTE — Assessment & Plan Note (Signed)
Eliquis for anticoagulation.  Rate is well controlled at this time.

## 2022-01-27 NOTE — Progress Notes (Signed)
Progress Note   Patient: Elizabeth Melton ORV:[id number] DOB: June 19, 1941 DOA: 01/25/2022     1 DOS: the patient was seen and examined on 01/27/2022   Brief hospital course: 81 y.o. female with medical history significant of COPD, interstitial lung disease, severe pulmonary hypertension, chronic respiratory failure on 4 to 5 L home O2, diastolic CHF, DM, CKD 3B, PAD, HTN, hypothyroidism, OSA on CPAP, paroxysmal A-fib and history of PE on Eliquis, recently hospitalized from 1/18 to 2/3 with acute on chronic respiratory failure who returns to the ED from [facility] by EMS with respiratory distress.  EMS reports patient was saturating at 59% on flow rate at 5 L and she arrived on NRB satting at 100%  2/17: On 7 L high flow nasal cannula   Assessment and Plan: * Acute on chronic respiratory failure with hypoxia ([place])- (present on admission) Due to COPD exacerbation with underlying ILD.  She is off BiPAP.  Weaned off to 7 L high flow nasal cannula at this time  Chronic obstructive pulmonary disease with acute exacerbation ([place])- (present on admission) Continue prednisone and albuterol nebulizer as needed.  Wean oxygen as able.  She is off BiPAP now on 7 L high flow nasal cannula  AF (paroxysmal atrial fibrillation) ([place])- (present on admission) Eliquis for anticoagulation.  Rate is well controlled at this time.  ILD (interstitial lung disease) ([place])- (present on admission) Follows with Dr. [name] at [facility] in [place].  He was made aware  Essential (primary) hypertension- (present on admission) Continue torsemide  Hypothyroidism- (present on admission) Continue levothyroxine.  TSH normal/within normal limits        Subjective: Feeling much better than when she first came in.  Still requiring high flow nasal cannula.  Hoping that she can go home at discharge then SNF  Physical Exam: Vitals:   01/27/22 0748 01/27/22 0806 01/27/22 0808 01/27/22 1116  BP: (!) 127/57   (!)  137/51  Pulse: 90 87 89 76  Resp: _0 Temp: 97.6 F (36.4 C)   98.1 F (36.7 C)  TempSrc: Oral     SpO2: 90% 97% 93% 92%  Weight:      Height:       81 year old female lying in the bed in no acute distress Eyes pupil equal round reactive to light and accommodation, no scleral icterus Lungs bilateral expiratory wheezing.  No rales.  Occasional rhonchi Cardiovascular irregularly irregular heart sounds.  No murmur rales or gallop Abdomen soft, benign Neuro alert and oriented, nonfocal Skin no rash or lesion Extremities no pedal edema  Data Reviewed:  There are no new results to review at this time.  Family Communication: None at bedside  Disposition: Status is: Inpatient Remains inpatient appropriate because: Still requiring 7 L high flow nasal cannula    DVT prophylaxis-Eliquis       Planned Discharge Destination: Home with home health     Time spent: 35 minutes  Author: Max Sane, MD 01/27/2022 2:29 PM  For on call review [email or web address].

## 2022-01-27 NOTE — Assessment & Plan Note (Signed)
Continue levothyroxine.  TSH normal/within normal limits

## 2022-01-27 NOTE — Assessment & Plan Note (Signed)
Continue prednisone and albuterol nebulizer as needed.  Wean oxygen as able.  She is off BiPAP now on 7 L high flow nasal cannula

## 2022-01-27 NOTE — Hospital Course (Signed)
81 y.o. female with medical history significant of COPD, interstitial lung disease, severe pulmonary hypertension, chronic respiratory failure on 4 to 5 L home O2, diastolic CHF, DM, CKD 3B, PAD, HTN, hypothyroidism, OSA on CPAP, paroxysmal A-fib and history of PE on Eliquis, recently hospitalized from 1/18 to 2/3 with acute on chronic respiratory failure who returns to the ED from [facility] by EMS with respiratory distress.  EMS reports patient was saturating at 59% on flow rate at 5 L and she arrived on NRB satting at 100%  2/17: On 7 L high flow nasal cannula

## 2022-01-27 NOTE — Consult Note (Signed)
Consultation Note Date: 01/27/2022   Patient Name: Elizabeth Melton  DOB: May 29, 1941  MRN: [id number]  Age / Sex: 81 y.o., female  PCP: Kathyrn Lass, MD Referring Physician: Max Sane, MD  Reason for Consultation: Establishing goals of care  HPI/Patient Profile: 81 y.o. female  with past medical history of  COPD, interstitial lung disease, severe pulmonary hypertension, chronic respiratory failure on 4 to 5 L home O2, diastolic CHF, DM, CKD 3B, PAD, HTN, hypothyroidism, OSA on CPAP, paroxysmal A-fib and history of PE on Eliquis, and recently hospitalized from 1/18 to 2/3 with acute on chronic respiratory failure  admitted on 01/25/2022 with respiratory distress. Diagnosed with acute on chronic respiratory failure. Required bipap initially, transitioned to HFNC. PMT consulted to discuss [place].  Please note, patient seen recently by inpatient palliative during previous admission. At that time [place] discussion was had between patient and multiple family members. Decisions were made to continue full code/full scope of care. Daughter signed MOST form indicating desire for FULL code and aggressive medical care including long term feeding tube if indicated.   Clinical Assessment and Goals of Care: I have reviewed medical records including EPIC notes, labs and imaging, assessed the patient and then met with patient to discuss diagnosis prognosis, GOC, EOL wishes, disposition and options.  No family at bedside.  At the time of my evaluation patient remains on HFNC and is disheartened by this - is hopeful to be weaned off. She is sitting up in chair in room, ordering lunch for herself.   I introduced Palliative Medicine as specialized medical care for people living with serious illness. It focuses on providing relief from the symptoms and stress of a serious illness. The goal is to improve quality of life for both the patient and the family.  We discuss how  patient has been doing since recent discharge - she tells me she has been at [facility] for [facility]. Tells me she was ambulating - mostly in her room - with a walker. She tells me she believes her breathing was good until this exacerbation that led to hospitalization. She tells me of poor appetite that is chronic.    We discussed patient's current illness and what it means in the larger context of patient's on-going co-morbidities.    I attempted to elicit values and goals of care important to the patient.    The difference between aggressive medical intervention and comfort care was considered in light of the patient's goals of care.   We reviewed that previously patient and her family had requested any medical care that would prolong life including CPR, mechanical ventilation, and long-term feeding tube. Patient confirms that these wishes are consistent with current wishes.   Discussed with family the importance of continued conversation with family and the medical providers regarding overall plan of care and treatment options, ensuring decisions are within the context of the patients values and GOCs.    Patient explains to me her desire to return home with home health - not planning to return to SNF.   Palliative Care services outpatient were explained and offered. Patient already connected to outpatient services and agreeable to continue.   Patient confirms that if she were unable to make decisions for herself she would want her daughter [name] to serve as Media planner. I offered to call [name] to provide update and review conversation however patient tells me [name] is busy with work and she will update her later.   Questions and concerns were addressed. The  family was encouraged to call with questions or concerns.   Primary Decision Maker PATIENT  Daughter [name] if patient unable  SUMMARY OF RECOMMENDATIONS   - continues to request full code/full scope medical care consistent with  previously expressed wishes - MOST on file reflecting these wishes - outpatient palliative to follow - patient hopeful to home health, avoid SNF  Code Status/Advance Care Planning: Full code      Primary Diagnoses: Present on Admission:  AF (paroxysmal atrial fibrillation) ([place])  Acute on chronic respiratory failure with hypoxia ([place])  Essential (primary) hypertension  Hypothyroidism  ILD (interstitial lung disease) ([place])   I have reviewed the medical record, interviewed the patient and family, and examined the patient. The following aspects are pertinent.  Past Medical History:  Diagnosis Date   Acute on chronic diastolic (congestive) heart failure ([place]) 05/19/2021   Acute respiratory failure with hypoxia ([facility]) 09/06/2020   Atrial fibrillation with RVR ([facility])    Diabetes mellitus without complication ([place])    Hyperlipidemia    Hypertension    Hypothyroidism    IPF (idiopathic pulmonary fibrosis) ([place])    Pneumonia due to COVID-19 virus 01/16/2021   Thrombocytopenia (H[facility])    Social History   Socioeconomic History   Marital status: Widowed    Spouse name: Not on file   Number of children: Not on file   Years of education: Not on file   Highest education level: Not on file  Occupational History   Occupation: retired  Tobacco Use   Smoking status: Some Days    Packs/day: 1.00    Years: 60.00    Pack years: 60.00    Types: Cigarettes    Start date: 12/11/1958   Smokeless tobacco: Former    Quit date: 09/24/1962   Tobacco comments:    3cigs per day as of 12/15/21 ep  Vaping Use   Vaping Use: Never used  Substance and Sexual Activity   Alcohol use: Yes    Comment: drinks beer 12 every 2 weeks   Drug use: No   Sexual activity: Not on file  Other Topics Concern   Not on file  Social History Narrative   Not on file   Social Determinants of Health   Financial Resource Strain: Not on file  Food Insecurity: No Food Insecurity   Worried About [place] in  the Last Year: Never true   [place] in the Last Year: Never true  Transportation Needs: No Transportation Needs   Lack of Transportation (Medical): No   Lack of Transportation (Non-Medical): No  Physical Activity: Not on file  Stress: Not on file  Social Connections: Not on file   Family History  Problem Relation Age of Onset   Diabetes Mother    Kidney disease Mother    Hypertension Mother    Other Father        tuberculosis   Hypertension Sister    Hyperlipidemia Sister    Scheduled Meds:  apixaban  5 mg Oral BID   buPROPion  150 mg Oral Daily   DULoxetine  30 mg Oral Daily   insulin aspart  0-15 Units Subcutaneous TID WC   insulin aspart  0-5 Units Subcutaneous QHS   insulin glargine-yfgn  10 Units Subcutaneous Daily   levothyroxine  75 mcg Oral Q0600   mouth rinse  15 mL Mouth Rinse BID   pantoprazole  40 mg Oral Daily   potassium chloride  20 mEq Oral Daily  predniSONE  40 mg Oral Q breakfast   torsemide  40 mg Oral Daily   Continuous Infusions: PRN Meds:.acetaminophen **OR** acetaminophen, albuterol, ondansetron **OR** ondansetron (ZOFRAN) IV Allergies  Allergen Reactions   Lisinopril Other (See Comments)    Other reaction(s): cough 09/20/17   Review of Systems  Constitutional:  Positive for activity change.  Respiratory:  Positive for shortness of breath.    Physical Exam Constitutional:      General: She is not in acute distress. Pulmonary:     Effort: Pulmonary effort is normal.  Skin:    General: Skin is warm and dry.  Neurological:     Mental Status: She is alert and oriented to person, place, and time.  Psychiatric:        Mood and Affect: Mood normal.        Behavior: Behavior normal.    Vital Signs: BP (!) 137/51    Pulse 76    Temp 98.1 F (36.7 C)    Resp 20    Ht _0  (1.676 m)    Wt 72.8 kg    SpO2 92%    BMI 25.91 kg/m  Pain Scale: 0-10   Pain Score: 0-No pain   SpO2: SpO2: 92 % O2 Device:SpO2: 92 % O2 Flow Rate: .O2  Flow Rate (L/min): (S) 7 L/min  IO: Intake/output summary:  Intake/Output Summary (Last 24 hours) at 01/27/2022 1236 Last data filed at 01/27/2022 1200 Gross per 24 hour  Intake 290 ml  Output 1750 ml  Net -1460 ml    LBM: Last BM Date : 01/23/22 Baseline Weight: Weight: 73 kg Most recent weight: Weight: 72.8 kg     Palliative Assessment/Data:PPS 50%     Juel Burrow, DNP, [facility] Palliative Medicine Team [phone] Pager: [phone]

## 2022-01-27 NOTE — Assessment & Plan Note (Signed)
Follows with Dr. [name] at [facility] in [place].  He was made aware

## 2022-01-27 NOTE — Evaluation (Signed)
Physical Therapy Evaluation Patient Details Name: Elizabeth Melton MRN: [id number] DOB: 1941/06/28 Today's Date: 01/27/2022  History of Present Illness  81 y.o. female with medical history significant of COPD, interstitial lung disease, severe pulmonary hypertension, chronic respiratory failure on 4 to 5 L home O2, diastolic CHF, DM, CKD 3B, PAD, HTN, hypothyroidism, OSA on CPAP, paroxysmal A-fib and history of PE on Eliquis, recently hospitalized from 1/18 to 2/3 with acute on chronic respiratory failure who returns to the ED from [facility] by EMS with respiratory distress.  EMS reports patient was saturating at 59% on flow rate at 5 L and she arrived on NRB satting at 100%.  Clinical Impression  The pt is demonstrating increased oxygen needs in order to remain with saturation levels >88%. She reports no SOB, but reported cramping of the LE during ambulation with oxygen noted <85%. Following rest break pt was able to recover but ambulation was ended. The pt would greatly benefit from SNF to optimize mobility. PT will continue to follow.        Recommendations for follow up therapy are one component of a multi-disciplinary discharge planning process, led by the attending physician.  Recommendations may be updated based on patient status, additional functional criteria and insurance authorization.  Follow Up Recommendations Skilled nursing-short term rehab (<3 hours/day)    Assistance Recommended at Discharge Frequent or constant Supervision/Assistance  Patient can return home with the following  A little help with walking and/or transfers;A little help with bathing/dressing/bathroom;Assistance with cooking/housework;Help with stairs or ramp for entrance;Assist for transportation    Equipment Recommendations None recommended by PT  Recommendations for Other Services       Functional Status Assessment Patient has had a recent decline in their functional status and demonstrates the  ability to make significant improvements in function in a reasonable and predictable amount of time.     Precautions / Restrictions Precautions Precautions: Fall Precaution Comments: monitor O2-requiring upwards of 10L for mobility. Restrictions Weight Bearing Restrictions: No      Mobility  Bed Mobility Overal bed mobility: Needs Assistance Bed Mobility: Supine to Sit     Supine to sit: Supervision, HOB elevated Sit to supine: Supervision, HOB elevated        Transfers Overall transfer level: Needs assistance Equipment used: Rolling walker (2 wheels) Transfers: Sit to/from Stand Sit to Stand: Min guard                Ambulation/Gait Ambulation/Gait assistance: Min Web designer (Feet): 25 Feet Assistive device: Rolling walker (2 wheels) Gait Pattern/deviations: Step-through pattern       General Gait Details: Required 10L to maintain oxygen saturation.  Stairs            Wheelchair Mobility    Modified Rankin (Stroke Patients Only)       Balance Overall balance assessment: Needs assistance Sitting-balance support: Feet supported Sitting balance-Leahy Scale: Good     Standing balance support: During functional activity Standing balance-Leahy Scale: Poor Standing balance comment: x2 LOB that required Max A to prevent fall.                             Pertinent Vitals/Pain Pain Assessment Pain Assessment: No/denies pain    Home Living Family/patient expects to be discharged to:: Skilled nursing facility Living Arrangements: Children Available Help at Discharge: Family;Available PRN/intermittently Type of Home: Apartment Home Access: Stairs to enter Entrance Stairs-Rails: Right Entrance Stairs-Number of Steps:  15   Home Layout: One level Home Equipment: Rollator (4 wheels);Cane - single point;Shower seat Additional Comments: Pt reports that she rarely ambulates within her community if not attending a doctor's  appointment.    Prior Function Prior Level of Function : Needs assist       Physical Assist : Mobility (physical) Mobility (physical): Gait;Transfers;Stairs   Mobility Comments: family manuevers O2 up and down steps; reports needing unilateral handrail and support from family for safe manuevering. ADLs Comments: has help for cooking/cleaning, pt reports indep with basic ADL, has groceries and medications delivered, no family to verify     Hand Dominance   Dominant Hand: Right    Extremity/Trunk Assessment   Upper Extremity Assessment Upper Extremity Assessment: Overall WFL for tasks assessed    Lower Extremity Assessment Lower Extremity Assessment: Overall WFL for tasks assessed       Communication   Communication: No difficulties  Cognition Arousal/Alertness: Awake/alert Behavior During Therapy: WFL for tasks assessed/performed Overall Cognitive Status: No family/caregiver present to determine baseline cognitive functioning                                          General Comments      Exercises Other Exercises Other Exercises: Attempted x1 stand without UE assistance with x2 attempts required to stand and unsteadiness noted at initial stand.   Assessment/Plan    PT Assessment Patient needs continued PT services  PT Problem List Decreased strength;Decreased balance;Decreased mobility;Decreased coordination;Cardiopulmonary status limiting activity;Pain;Decreased skin integrity       PT Treatment Interventions DME instruction;Gait training;Stair training;Functional mobility training;Therapeutic activities;Balance training;Therapeutic exercise;Neuromuscular re-education;Patient/family education    PT Goals (Current goals can be found in the Care Plan section)  Acute Rehab PT Goals Patient Stated Goal: progress enough to go home PT Goal Formulation: With patient Time For Goal Achievement: 02/10/22 Potential to Achieve Goals: Fair    Frequency  Min 2X/week     Co-evaluation               AM-PAC PT "6 Clicks" Mobility  Outcome Measure Help needed turning from your back to your side while in a flat bed without using bedrails?: A Little Help needed moving from lying on your back to sitting on the side of a flat bed without using bedrails?: A Little Help needed moving to and from a bed to a chair (including a wheelchair)?: A Little Help needed standing up from a chair using your arms (e.g., wheelchair or bedside chair)?: A Little     6 Click Score: 12    End of Session Equipment Utilized During Treatment: Oxygen;Gait belt Activity Tolerance: Patient tolerated treatment well Patient left: in bed;with bed alarm set;with call bell/phone within reach Nurse Communication: Mobility status PT Visit Diagnosis: Unsteadiness on feet (R26.81);Muscle weakness (generalized) (M62.81);Difficulty in walking, not elsewhere classified (R26.2)    Time: 9735-3299 PT Time Calculation (min) (ACUTE ONLY): 36 min   Charges:   PT Evaluation $PT Eval Moderate Complexity: 1 Mod PT Treatments $Gait Training: 23-37 mins        5:11 PM, 01/27/22 Yeshaya Vath A. Saverio Danker PT, DPT Physical Therapist - [facility]   Kanylah Muench A Camrynn Mcclintic 01/27/2022, 5:08 PM

## 2022-01-27 NOTE — Evaluation (Signed)
Occupational Therapy Evaluation Patient Details Name: Elizabeth Melton MRN: [id number] DOB: [birth year] Today's Date: 01/27/2022   History of Present Illness 81 y.o. female with medical history significant of COPD, interstitial lung disease, severe pulmonary hypertension, chronic respiratory failure on 4 to 5 L home O2, diastolic CHF, DM, CKD 3B, PAD, HTN, hypothyroidism, OSA on CPAP, paroxysmal A-fib and history of PE on Eliquis, recently hospitalized from 1/18 to 2/3 with acute on chronic respiratory failure who returns to the ED from [facility] by EMS with respiratory distress.  EMS reports patient was saturating at 59% on flow rate at 5 L and she arrived on NRB satting at 100%.   Clinical Impression   Pt was seen for OT evaluation this date. Prior to hospital admission, pt was had SNF for short term rehab. Prior to recent admission, pt was at home with dtr. Pt lives in 2nd floor apartment with no elevator access. She reports that her dtr would carry her O2 tank up the stairs and she uses a rollator for mobility. Pt alert and oriented to self, mo/yr, and place but required cues for situation. Pt follows commands well, but demo's slightly decreased safety awareness and awareness of deficits. Pt denies SOB throughout, despite significant desaturation with limited exertion (RN notified). On 7L O2 at start of session with SpO2 in low 80's. Bumped to 8L O2 and required +time and PLB to recover to low 90's after taking a couple steps to recliner from EOB. Pt completed LB dressing with supervision seated EOB, CGA to complete in standing. Currently pt demonstrates impairments as described below (See OT problem list) which functionally limit her ability to perform ADL/self-care tasks. Pt would benefit from skilled OT services to address noted impairments and functional limitations (see below for any additional details) in order to maximize safety and independence while minimizing falls risk and caregiver  burden. Upon hospital discharge, recommend STR to maximize pt safety and return to PLOF.  Pt primarily limited by O2 needs.    Recommendations for follow up therapy are one component of a multi-disciplinary discharge planning process, led by the attending physician.  Recommendations may be updated based on patient status, additional functional criteria and insurance authorization.   Follow Up Recommendations  Skilled nursing-short term rehab (<3 hours/day)    Assistance Recommended at Discharge Intermittent Supervision/Assistance  Patient can return home with the following A little help with walking and/or transfers;A little help with bathing/dressing/bathroom;Assist for transportation;Assistance with cooking/housework;Help with stairs or ramp for entrance;Direct supervision/assist for medications management    Functional Status Assessment  Patient has had a recent decline in their functional status and demonstrates the ability to make significant improvements in function in a reasonable and predictable amount of time.  Equipment Recommendations  BSC/3in1    Recommendations for Other Services       Precautions / Restrictions Precautions Precautions: Fall Precaution Comments: monitor O2 Restrictions Weight Bearing Restrictions: No      Mobility Bed Mobility Overal bed mobility: Needs Assistance Bed Mobility: Supine to Sit     Supine to sit: Supervision          Transfers Overall transfer level: Needs assistance Equipment used: Rolling walker (2 wheels) Transfers: Sit to/from Stand, Bed to chair/wheelchair/BSC Sit to Stand: Min guard     Step pivot transfers: Min guard     General transfer comment: CGA + VC for safety      Balance Overall balance assessment: Needs assistance Sitting-balance support: No upper extremity supported, Feet supported  Sitting balance-Leahy Scale: Good     Standing balance support: Bilateral upper extremity supported Standing  balance-Leahy Scale: Fair                             ADL either performed or assessed with clinical judgement   ADL Overall ADL's : Needs assistance/impaired                                       General ADL Comments: Pt donned socks seated EOB with out direct assist, CGA for ADL transfers, primarily limited from desaturation with limited exertion.     Vision         Perception     Praxis      Pertinent Vitals/Pain Pain Assessment Pain Assessment: No/denies pain     Hand Dominance Right   Extremity/Trunk Assessment Upper Extremity Assessment Upper Extremity Assessment: Generalized weakness   Lower Extremity Assessment Lower Extremity Assessment: Generalized weakness       Communication Communication Communication: No difficulties   Cognition Arousal/Alertness: Awake/alert Behavior During Therapy: WFL for tasks assessed/performed Overall Cognitive Status: No family/caregiver present to determine baseline cognitive functioning                                 General Comments: pt alert and oriented to place, self, and mo/yr, unsure why she's here; thought she was supposed to go to the OB to address burning sensation with urination but easily reoriented     General Comments  on 7L HFNC at rest, SpO2 79-83% in bed and seated EOB. Placed on 8L O2 HFNC and with VC for PLB, improved to 91%. With standing and a couple steps to recliner, pt desats to 79% requiring a few minutes + VC for PLB to improve to 90-93%. Left on 8L HFNC, RN notified.    Exercises Other Exercises Other Exercises: Pt instructed in PLB to improve O2 sats and VC for activity pacing for ADL/exertional activity to support recovery and minimize SOB   Shoulder Instructions      Home Living Family/patient expects to be discharged to:: Skilled nursing facility Living Arrangements: Children Available Help at Discharge: Family;Available PRN/intermittently Type of  Home: Apartment (2nd fl apt) Home Access: Stairs to enter Entrance Stairs-Number of Steps: 15 Entrance Stairs-Rails: Right Home Layout: One level     Bathroom Shower/Tub: Teacher, early years/pre: Standard     Home Equipment: Rollator (4 wheels);Cane - single point   Additional Comments: Pt is questionable historian when it comes to family assist available but home set up aligns with recent admission      Prior Functioning/Environment Prior Level of Function : Needs assist             Mobility Comments: family brings O2 up steps, pt reports using rollator primarily ADLs Comments: has help for cooking/cleaning, pt reports indep with basic ADL, has groceries and medications delivered, no family to verify        OT Problem List: Cardiopulmonary status limiting activity;Decreased strength;Decreased activity tolerance;Impaired balance (sitting and/or standing);Decreased knowledge of use of DME or AE      OT Treatment/Interventions: Self-care/ADL training;Therapeutic exercise;Therapeutic activities;DME and/or AE instruction;Energy conservation;Patient/family education;Balance training    OT Goals(Current goals can be found in the care plan section) Acute Rehab OT  Goals Patient Stated Goal: go back home with dtr OT Goal Formulation: With patient Time For Goal Achievement: 02/10/22 Potential to Achieve Goals: Good ADL Goals Pt Will Perform Lower Body Dressing: with modified independence;sit to/from stand (SpO2 >90% on baseline O2) Pt Will Transfer to Toilet: with supervision;ambulating (LRAD, elevated commode, SpO2 >90% on baseline O2) Additional ADL Goal #1: Pt will verbalize plan to implement at least 2 learned energy conservation strategies to maximize safety/indep with ADL.  OT Frequency: Min 2X/week    Melton-evaluation              AM-PAC OT "6 Clicks" Daily Activity     Outcome Measure Help from another person eating meals?: None Help from another person  taking care of personal grooming?: None Help from another person toileting, which includes using toliet, bedpan, or urinal?: A Little Help from another person bathing (including washing, rinsing, drying)?: A Little Help from another person to put on and taking off regular upper body clothing?: None Help from another person to put on and taking off regular lower body clothing?: A Little 6 Click Score: 21   End of Session Equipment Utilized During Treatment: Gait belt;Rolling walker (2 wheels);Oxygen Nurse Communication: Other (comment);Mobility status (O2)  Activity Tolerance: Patient tolerated treatment well Patient left: in chair;with call bell/phone within reach;with chair alarm set  OT Visit Diagnosis: Other abnormalities of gait and mobility (R26.89);Muscle weakness (generalized) (M62.81)                Time: 3276-1470 OT Time Calculation (min): 21 min Charges:  OT General Charges $OT Visit: 1 Visit OT Evaluation $OT Eval Moderate Complexity: 1 Mod OT Treatments $Self Care/Home Management : 8-22 mins  Ardeth Perfect., MPH, MS, OTR/L ascom [phone] 01/27/22, 1:09 PM

## 2022-01-27 NOTE — TOC Initial Note (Addendum)
Transition of Care [facility]) - Initial/Assessment Note    Patient Details  Name: Elizabeth Melton MRN: [id number] Date of Birth: 06/16/1941  Transition of Care [facility]) CM/SW Contact:    Candie Chroman, LCSW Phone Number: 01/27/2022, 9:20 AM  Clinical Narrative: CSW met with patient. No supports at bedside. CSW introduced role and explained that discharge planning would be discussed. Patient confirmed she was admitted from [facility] where she was getting short-term rehab. According to chart review, she admitted on 2/3 until she came to the hospital on 2/15. Patient wants to return home with home health at discharge. She lives with her daughter. Has a shower chair and oxygen ([employer]) at home. Patient wants to talk with her daughter before CSW calls her. Will call daughter this afternoon. MD has entered PT/OT consults. No further concerns. CSW encouraged patient to contact CSW as needed. CSW will continue to follow patient for support and facilitate discharge once medically stable.         2:51 pm: Called daughter and discussed SNF vs HH. Asked MD and PT to call her. Daughter wants to make sure she has all the information she needs to help patient make decision between returning to [facility] vs going home with home health. Called Caryl Pina with [employer]. If patient goes home and requires more than 5 L, will need new sats note and DME orders to get 10 L concentrator.         Expected Discharge Plan: [place] Barriers to Discharge: Continued Medical Work up   Patient Goals and CMS Choice        Expected Discharge Plan and Services Expected Discharge Plan: [place] Acute Care Choice:  (TBD) Living arrangements for the past 2 months: [place], [place]                                      Prior Living Arrangements/Services Living arrangements for the past 2 months: [place], [place] Lives  with:: Adult Children Patient language and need for interpreter reviewed:: Yes Do you feel safe going back to the place where you live?: Yes      Need for Family Participation in Patient Care: Yes (Comment) Care giver support system in place?: Yes (comment) Current home services: DME Criminal Activity/Legal Involvement Pertinent to Current Situation/Hospitalization: No - Comment as needed  Activities of Daily Living Home Assistive Devices/Equipment: Cane (specify quad or straight), Walker (specify type) ADL Screening (condition at time of admission) Patient's cognitive ability adequate to safely complete daily activities?: Yes Is the patient deaf or have difficulty hearing?: No Does the patient have difficulty seeing, even when wearing glasses/contacts?: Yes Does the patient have difficulty concentrating, remembering, or making decisions?: No Patient able to express need for assistance with ADLs?: Yes Does the patient have difficulty dressing or bathing?: No Independently performs ADLs?: No Does the patient have difficulty walking or climbing stairs?: Yes Weakness of Legs: Both Weakness of Arms/Hands: None  Permission Sought/Granted Permission sought to share information with : Family Supports    Share Information with NAME: Neomia Dear     Permission granted to share info w Relationship: Daughter  Permission granted to share info w Contact Information: [phone]  Emotional Assessment Appearance:: Appears stated age Attitude/Demeanor/Rapport: Engaged, Gracious Affect (typically observed): Accepting, Appropriate, Calm, Pleasant Orientation: : Oriented  to Self, Oriented to Place, Oriented to  Time, Oriented to Situation Alcohol / Substance Use: Not Applicable Psych Involvement: No (comment)  Admission diagnosis:  Respiratory distress [R06.03] Thrombocytopenia (HCC) [D69.6] ILD (interstitial lung disease) ([place]) [J84.9] Hypoxia [R09.02] AKI (acute kidney injury) ([place])  [N17.9] COPD with acute exacerbation (HCC) [J44.1] Chronic obstructive pulmonary disease with acute exacerbation (HCC) [J44.1] Acute on chronic congestive heart failure, unspecified heart failure type [facility]) [I50.9] Patient Active Problem List   Diagnosis Date Noted   Chronic obstructive pulmonary disease with acute exacerbation ([place]) 01/26/2022   Acute renal failure superimposed on stage 3b chronic kidney disease (HCC) 01/26/2022   Thrombocytopenia (HCC)    Elevated lactic acid level    Respiratory distress    Hypoxia    Low back pain 01/11/2022   Acute respiratory failure ([place]) 12/29/2021   COPD with chronic bronchitis and emphysema (HCC)    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  12/28/2021   Hyperkalemia 12/28/2021   CKD (chronic kidney disease) 12/28/2021   Acute right-sided CHF (congestive heart failure) (HCC) 11/06/2021   Elevated troponin 11/03/2021   Constipation 11/03/2021   Pulmonary HTN (HCC)    Acute on chronic systolic (congestive) heart failure ([place]) 05/19/2021   Acute on chronic respiratory failure with hypoxia (HCC) 05/19/2021   SOB (shortness of breath) 05/19/2021   AF (paroxysmal atrial fibrillation) ([place]) 05/19/2021   Acute on chronic congestive heart failure (HCC)    Demand ischemia (HCC)    Acute respiratory failure with hypoxia ([place]) 09/06/2020   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   ILD (interstitial lung disease) ([place]) 10/23/2019   Chronic respiratory failure with hypoxia ([place]) 10/23/2019   Essential (primary) hypertension 09/18/2018   PAD (peripheral artery disease) ([place]) 09/18/2018   Tobacco abuse 09/18/2018   Aortic regurgitation 09/18/2018   Hypothyroidism    Hyperlipidemia/PAD    Type 2 diabetes mellitus with hyperglycemia ([place])    PCP:  Kathyrn Lass, MD Pharmacy:   [id number] [place], [place] [address]Phone: [phone]: [phone]    Social Determinants of Health (SDOH) Interventions    Readmission Risk Interventions Readmission Risk Prevention Plan 09/10/2020  Post Dischage Appt Complete  Medication Screening Complete  Transportation Screening Complete  Some recent data might be hidden

## 2022-01-28 DIAGNOSIS — J849 Interstitial pulmonary disease, unspecified: Secondary | ICD-10-CM

## 2022-01-28 LAB — GLUCOSE, CAPILLARY
Glucose-Capillary: 163 mg/dL — ABNORMAL HIGH (ref 70–99)
Glucose-Capillary: 159 mg/dL — ABNORMAL HIGH (ref 70–99)

## 2022-01-28 MED ORDER — PREDNISONE 10 MG (21) PO TBPK: ORAL_TABLET | ORAL | 0 refills | Status: DC

## 2022-01-28 NOTE — TOC Transition Note (Signed)
Transition of Care [facility]) - CM/SW Discharge Note   Patient Details  Name: Elizabeth Melton MRN: [id number] Date of Birth: 07/24/1941  Transition [facility]) CM/SW Contact:  Harriet Masson, RN Phone [phone] 01/28/2022, 9:11 AM   Clinical Narrative:    Spoke with Kem Kays) and confirmed pt will be serviced for PT/OT. Also confirmed daughter has O2 portable and indicated [facility] will set up home O2 today in the pt's home. Pt has a cane and walker with family transporting pt home today. Daughter is aware Palliative has been recommended and daughter confirmed they have the information but pt was hospitalized prior to a home visit. Daughter will call and inform [facility] that pt has returned home for services.   No other needs at this time. Pt will be discharged today.    Barriers to Discharge: Continued Medical Work up   Patient Goals and CMS Choice        Discharge Placement                       Discharge Plan and Services     Post Acute Care Choice:  (TBD)                               Social Determinants of Health (SDOH) Interventions     Readmission Risk Interventions Readmission Risk Prevention Plan 09/10/2020  Post Dischage Appt Complete  Medication Screening Complete  Transportation Screening Complete  Some recent data might be hidden

## 2022-01-28 NOTE — TOC Transition Note (Signed)
Transition of Care [facility]) - CM/SW Discharge Note   Patient Details  Name: Elizabeth Melton MRN: [id number] Date of Birth: November 16, 1941  Transition of Care [facility]) CM/SW Contact:  Harriet Masson, RN Phone Number:[phone] 01/28/2022, 12:35 PM   Clinical Narrative:    Pt can't find her regulator for her portable tank and son has requested another one for discharge her discharge today. RN has contacted [employer] where pt has an account and agency will deliver another regulator to the bedside. No additional needs.     Barriers to Discharge: Continued Medical Work up   Patient Goals and CMS Choice        Discharge Placement                       Discharge Plan and Services     Post Acute Care Choice:  (TBD)                               Social Determinants of Health (SDOH) Interventions     Readmission Risk Interventions Readmission Risk Prevention Plan 09/10/2020  Post Dischage Appt Complete  Medication Screening Complete  Transportation Screening Complete  Some recent data might be hidden

## 2022-01-28 NOTE — Discharge Summary (Signed)
Physician Discharge Summary   Patient: Elizabeth Melton MRN: [id number] DOB: 1941/07/12  Admit date:     01/25/2022  Discharge date: 01/28/22  Discharge Physician: Max Sane   PCP: Kathyrn Lass, MD   Recommendations at discharge:   Follow-up with outpatient providers as requested  Discharge Diagnoses: Principal Problem:   Acute on chronic respiratory failure with hypoxia ([place]) Active Problems:   Hypothyroidism   Essential (primary) hypertension   ILD (interstitial lung disease) (HCC)   AF (paroxysmal atrial fibrillation) (HCC)   Chronic obstructive pulmonary disease with acute exacerbation (HCC)   Acute renal failure superimposed on stage 3b chronic kidney disease (HCC)   Thrombocytopenia (HCC)   Elevated lactic acid level   Respiratory distress   Hypoxia   Hospital Course: 81 y.o. female with medical history significant of COPD, interstitial lung disease, severe pulmonary hypertension, chronic respiratory failure on 4 to 5 L home O2, diastolic CHF, DM, CKD 3B, PAD, HTN, hypothyroidism, OSA on CPAP, paroxysmal A-fib and history of PE on Eliquis, recently hospitalized from 1/18 to 2/3 with acute on chronic respiratory failure who returns [facility] with respiratory distress.  EMS reports patient was saturating at 59% on flow rate at 5 L and she arrived on NRB satting at 100%  2/17: On 7 L high flow nasal cannula  Assessment and Plan: * Acute on chronic respiratory failure with hypoxia ([place])- (present on admission) Due to COPD exacerbation with underlying ILD.  Initially required BiPAP in the emergency department and later weaned off to 5 L oxygen by nasal cannula which is her baseline oxygen requirement  Chronic obstructive pulmonary disease with acute exacerbation ([place])- (present on admission) Continue prednisone and albuterol nebulizer as needed.  Initially required BiPAP and now weaned off to 5 L oxygen via nasal cannula which is her baseline  AF  (paroxysmal atrial fibrillation) ([place])- (present on admission) Eliquis for anticoagulation.  Rate is well controlled at this time.  ILD (interstitial lung disease) ([place])- (present on admission) Follows with Dr. [name] at [facility] [place].  Outpatient follow-up with them.  I admitted him aware of her hospitalization  Essential (primary) hypertension- (present on admission) Continue torsemide  Hypothyroidism- (present on admission) Continue levothyroxine.  TSH normal/within normal limits           Consultants: Palliative care Disposition: Home health PT, OT Diet recommendation:  Discharge Diet Orders (From admission, onward)     Start     Ordered   01/28/22 0000  Diet - low sodium heart healthy        01/28/22 0835           Cardiac diet  DISCHARGE MEDICATION: Allergies as of 01/28/2022       Reactions   Lisinopril Other (See Comments)   Other reaction(s): cough 09/20/17        Medication List     TAKE these medications    acetaminophen 500 MG tablet Commonly known as: TYLENOL Take 1,000 mg by mouth every 8 (eight) hours as needed for headache or mild pain (pain).   albuterol (2.5 MG/3ML) 0.083% nebulizer solution Commonly known as: PROVENTIL Take 3 mLs (2.5 mg total) by nebulization every 4 (four) hours as needed for wheezing or shortness of breath.   apixaban 5 MG Tabs tablet Commonly known as: ELIQUIS Take 1 tablet (5 mg total) by mouth 2 (two) times daily.   b complex vitamins tablet Take 1 tablet by mouth daily.  bisacodyl 10 MG suppository Commonly known as: DULCOLAX Place 10 mg rectally daily as needed for moderate constipation.   Breztri Aerosphere 160-9-4.8 MCG/ACT Aero Generic drug: Budeson-Glycopyrrol-Formoterol Inhale 2 puffs into the lungs in the morning and at bedtime.   Breztri Aerosphere 160-9-4.8 MCG/ACT Aero Generic drug: Budeson-Glycopyrrol-Formoterol Inhale 2 puffs into the lungs in the morning and at bedtime.    buPROPion 150 MG 24 hr tablet Commonly known as: WELLBUTRIN XL Take 1 tablet (150 mg total) by mouth daily.   Calcium + D3 600-200 MG-UNIT Tabs Take 2 tablets by mouth daily.   diphenhydrAMINE 25 MG tablet Commonly known as: BENADRYL Take 25 mg by mouth every 6 (six) hours as needed for allergies.   DULoxetine 30 MG capsule Commonly known as: CYMBALTA Take 1 capsule (30 mg total) by mouth daily.   Fish Oil 1000 MG Caps Take 1,000 mg by mouth daily.   glucose blood test strip 1 each by Other route as needed for other (blood sugar).   guaiFENesin 600 MG 12 hr tablet Commonly known as: MUCINEX Take 1 tablet (600 mg total) by mouth 2 (two) times daily. What changed: additional instructions   insulin glargine 100 UNIT/ML injection Commonly known as: LANTUS Inject 10 Units into the skin daily.   KLOR-CON PO Take 20 mEq by mouth daily.   levothyroxine 75 MCG tablet Commonly known as: SYNTHROID Take 75 mcg by mouth daily before breakfast.   lidocaine 5 % Commonly known as: LIDODERM Place 1 patch onto the skin at bedtime. Remove & Discard patch within 12 hours or as directed by MD   lovastatin 40 MG tablet Commonly known as: MEVACOR Take 40 mg by mouth at bedtime.   Methocarbamol 1000 MG Tabs Take 1,000 mg by mouth every 8 (eight) hours as needed for muscle spasms.   metoprolol succinate 25 MG 24 hr tablet Commonly known as: TOPROL-XL Take 0.5 tablets (12.5 mg total) by mouth daily.   nicotine 14 mg/24hr patch Commonly known as: NICODERM CQ - dosed in mg/24 hours Place 14 mg onto the skin daily.   OXYGEN Inhale 4 L into the lungs continuous.   pantoprazole 40 MG tablet Commonly known as: PROTONIX Take 1 tablet (40 mg total) by mouth daily.   PEN NEEDLES 29GX1/2" 29G X 12MM Misc For insulin injection   predniSONE 10 MG (21) Tbpk tablet Commonly known as: STERAPRED UNI-PAK 21 TAB Start 60 mg po daily, taper 10 mg daily until finish   senna-docusate 8.6-50  MG tablet Commonly known as: Senokot-S Take 2 tablets by mouth at bedtime. What changed: additional instructions   Smart Sense Thin Lancets 26G Misc 1 each by Does not apply route 2 (two) times daily.   torsemide 20 MG tablet Commonly known as: DEMADEX Patient takes 2 tablets once daily.   traMADol 50 MG tablet Commonly known as: ULTRAM Take 1 tablet (50 mg total) by mouth every 6 (six) hours as needed for moderate pain.   True Metrix Meter w/Device Kit 1 each by Other route in the morning and at bedtime.   VITAMIN D PO Take 1,000 Units by mouth daily.        Follow-up Information     Kathyrn Lass, MD. Schedule an appointment as soon as possible for a visit in 1 week(s).   Specialty: Family Medicine Why: [facility] Discharge F/UP Contact information: [place] [address] [phone]         [name], MD. Schedule an appointment  as soon as possible for a visit in 2 week(s).   Specialties: Cardiology, Radiology Why: [facility] Discharge F/UP Contact information: [place] [address] [place], [place], [place]. Schedule an appointment as soon as possible for a visit in 3 day(s).   Specialty: Family Medicine Why: [facility] Discharge F/UP, If symptoms worsen Contact information: [place] [address] [phone]         Brand Males, MD. Schedule an appointment as soon as possible for a visit in 2 week(s).   Specialty: Pulmonary Disease Why: [facility] Discharge F/UP Contact information: [address] [place] [address] [phone]                 Discharge Exam: Danley Danker Weights   01/25/22 2345 01/26/22 1213  Weight: 73 kg 70.29 kg   81 year old female lying in the bed in no acute distress Eyes pupil equal round reactive to light and accommodation, no scleral icterus Lungs clear to auscultation  bilaterally no rales, rhonchi Cardiovascular irregularly irregular heart sounds.  No murmur rales or gallop Abdomen soft, benign Neuro alert and oriented, nonfocal Skin no rash or lesion Extremities no pedal edema  Condition at discharge: fair  The results of significant diagnostics from this hospitalization (including imaging, microbiology, ancillary and laboratory) are listed below for reference.   Imaging Studies: DG Chest 2 View  Result Date: 01/25/2022 CLINICAL DATA:  Pulmonary fibrosis EXAM: CHEST - 2 VIEW COMPARISON:  01/09/2022 FINDINGS: Frontal and lateral views of the chest demonstrates stable enlargement of the cardiac silhouette. Stable basilar predominant pulmonary fibrosis. No acute airspace disease, effusion, or pneumothorax. No acute bony abnormalities. IMPRESSION: 1. Stable pulmonary fibrosis.  No acute airspace disease. Electronically Signed   By: Randa Ngo M.D.   On: 01/25/2022 23:59   DG Chest 2 View  Result Date: 01/05/2022 CLINICAL DATA:  Shortness of breath EXAM: CHEST - 2 VIEW COMPARISON:  Previous studies including the examination of 12/28/2021 FINDINGS: Transverse diameter of heart is increased. There is diffuse increase in interstitial markings in both lungs. There is interval improvement in aeration in the right mid and right lower lung fields. No new focal pulmonary consolidation is seen. Lateral CP angles are clear. There is no pneumothorax. IMPRESSION: Pulmonary fibrosis. Cardiomegaly. There is interval improvement in aeration in the right parahilar region and right lower lung fields suggesting resolving pulmonary edema or resolving pneumonia superimposed over pulmonary fibrosis. Electronically Signed   By: Elmer Picker M.D.   On: 01/05/2022 13:34   DG Lumbar Spine 2-3 Views  Result Date: 01/03/2022 CLINICAL DATA:  Back pain EXAM: LUMBAR SPINE - 2-3 VIEW COMPARISON:  None. FINDINGS: No recent fracture is seen. There is first-degree anterolisthesis at  L4-L5 level. There is disc space narrowing at the L4-L5 level. Degenerative changes are noted in facet joints, more so at L4-L5 and L5-S1 levels. Extensive arterial calcifications are seen. Calcifications in the right upper quadrant suggest gallbladder stones. Fibrotic changes are noted in the visualized lower lung fields. IMPRESSION: No recent fracture is seen. There is first-degree anterolisthesis at L4-L5 level along with disc space narrowing. Degenerative changes are noted in facet joints in the lower lumbar spine. Gallbladder stones. Pulmonary fibrosis.  Severe atherosclerotic disease. Electronically Signed   By: Elmer Picker M.D.   On: 01/03/2022 12:30   DG Sacrum/Coccyx  Result Date: 01/03/2022 CLINICAL DATA:  Low back pain EXAM:  SACRUM AND COCCYX - 2+ VIEW COMPARISON:  None. FINDINGS: No recent fracture is seen. There is sclerosis adjacent to the SI joints, especially in the inferior aspect of right SI joint. Osteopenia is seen in bony structures. Vascular calcifications are seen. Coarse calcifications in the pelvis suggest possible calcified uterine fibroids. IMPRESSION: No recent fracture is seen. Osteopenia. Degenerative changes are noted in the SI joints, more so on the right side. Electronically Signed   By: Elmer Picker M.D.   On: 01/03/2022 12:32   CARDIAC CATHETERIZATION  Result Date: 01/06/2022 1. Optimized filling pressures. 2. Severe pulmonary arterial hypertension   DG Chest Port 1 View  Result Date: 01/25/2022 CLINICAL DATA:  Shortness of breath, respiratory distress EXAM: PORTABLE CHEST 1 VIEW COMPARISON:  01/09/2022 FINDINGS: Single frontal view of the chest demonstrates an enlarged cardiac silhouette. There are chronic changes of pulmonary fibrosis again noted, basilar predominant. No acute airspace disease, effusion, or pneumothorax. No acute bony abnormalities. IMPRESSION: 1. Stable pulmonary fibrosis.  No acute airspace disease. Electronically Signed   By: Randa Ngo M.D.   On: 01/25/2022 23:58   DG CHEST PORT 1 VIEW  Result Date: 01/09/2022 CLINICAL DATA:  Hypoxia. EXAM: PORTABLE CHEST 1 VIEW COMPARISON:  01/05/2022 FINDINGS: Stable enlarged cardiac silhouette and tortuous and calcified thoracic aorta. No significant change in diffuse prominence of the interstitial markings without superimposed airspace opacity or pleural fluid. Unremarkable bones. IMPRESSION: Stable cardiomegaly and pulmonary fibrosis. No acute abnormality. Stable cardiomegaly and pulmonary fibrosis. No acute abnormality. Electronically Signed   By: Claudie Revering M.D.   On: 01/09/2022 14:22    Microbiology: Results for orders placed or performed during the hospital encounter of 01/25/22  Resp Panel by RT-PCR (Flu A&B, Covid) Nasopharyngeal Swab     Status: None   Collection Time: 01/25/22 11:58 PM   Specimen: Nasopharyngeal Swab; Nasopharyngeal(NP) swabs in vial transport medium  Result Value Ref Range Status   SARS Coronavirus 2 by RT PCR NEGATIVE NEGATIVE Final    Comment: (NOTE) SARS-CoV-2 target nucleic acids are NOT DETECTED.  The SARS-CoV-2 RNA is generally detectable in upper respiratory specimens during the acute phase of infection. The lowest concentration of SARS-CoV-2 viral copies this assay can detect is 138 copies/mL. A negative result does not preclude SARS-Cov-2 infection and should not be used as the sole basis for treatment or other patient management decisions. A negative result may occur with  improper specimen collection/handling, submission of specimen other than nasopharyngeal swab, presence of viral mutation(s) within the areas targeted by this assay, and inadequate number of viral copies(<138 copies/mL). A negative result must be combined with clinical observations, patient history, and epidemiological information. The expected result is Negative.  Fact Sheet for Patients:  EntrepreneurPulse.com.au  Fact Sheet for Healthcare  Providers:  IncredibleEmployment.be  This test is no t yet approved or cleared by the [place] FDA and  has been authorized for detection and/or diagnosis of SARS-CoV-2 by FDA under an Emergency Use Authorization (EUA). This EUA will remain  in effect (meaning this test can be used) for the duration of the COVID-19 declaration under Section 564(b)(1) of the Act, 21 U.S.C.section 360bbb-3(b)(1), unless the authorization is terminated  or revoked sooner.       Influenza A by PCR NEGATIVE NEGATIVE Final   Influenza B by PCR NEGATIVE NEGATIVE Final    Comment: (NOTE) The Xpert Xpress SARS-CoV-2/FLU/RSV plus assay is intended as an aid in the diagnosis of influenza from Nasopharyngeal swab specimens and should not  be used as a sole basis for treatment. Nasal washings and aspirates are unacceptable for Xpert Xpress SARS-CoV-2/FLU/RSV testing.  Fact Sheet for Patients: EntrepreneurPulse.com.au  Fact Sheet for Healthcare Providers: IncredibleEmployment.be  This test is not yet approved or cleared by the [place] FDA and has been authorized for detection and/or diagnosis of SARS-CoV-2 by FDA under an Emergency Use Authorization (EUA). This EUA will remain in effect (meaning this test can be used) for the duration of the COVID-19 declaration under Section 564(b)(1) of the Act, 21 U.S.C. section 360bbb-3(b)(1), unless the authorization is terminated or revoked.  Performed at [facility], [address] [place], [place] [address]   MRSA Next Gen by PCR, Nasal     Status: None   Collection Time: 01/26/22  5:37 PM   Specimen: Nasal Mucosa; Nasal Swab  Result Value Ref Range Status   MRSA by PCR Next Gen NOT DETECTED NOT DETECTED Final    Comment: (NOTE) The GeneXpert MRSA Assay (FDA approved for NASAL specimens only), is one component of a comprehensive MRSA colonization surveillance program. It is not  intended to diagnose MRSA infection nor to guide or monitor treatment for MRSA infections. Test performance is not FDA approved in patients less than 62 years old. Performed at [facility], [address] [place], [place] [address]     Labs: CBC: Recent Labs  Lab 01/25/22 2356 01/27/22 0423  WBC 9.8 12.3*  NEUTROABS 6.5  --   HGB 14.3 13.8  HCT 44.2 42.4  MCV 100.9* 99.1  PLT 119* 657*   Basic Metabolic Panel: Recent Labs  Lab 01/25/22 2356 01/27/22 0423  NA 139 139  K 4.3 4.5  CL 105 106  CO2 26 26  GLUCOSE 177* 150*  BUN 41* 35*  CREATININE 1.82* 1.23*  CALCIUM 9.0 9.2  MG 2.1  --    Liver Function Tests: Recent Labs  Lab 01/25/22 2356  AST 29  ALT 22  ALKPHOS 71  BILITOT 0.9  PROT 7.2  ALBUMIN 3.3*   CBG: Recent Labs  Lab 01/27/22 1118 01/27/22 1656 01/27/22 1933 01/28/22 0725 01/28/22 1129  GLUCAP 145* 226* 232* 163* 159*    Discharge time spent: greater than 30 minutes.  Signed: Max Sane, MD [facility] 01/28/2022

## 2022-01-30 ENCOUNTER — Other Ambulatory Visit (HOSPITAL_COMMUNITY): Payer: Self-pay

## 2022-01-30 ENCOUNTER — Telehealth: Payer: Self-pay | Admitting: *Deleted

## 2022-01-30 NOTE — Telephone Encounter (Signed)
Received message from [place]:  Patient had been pending for PAP. Today our rep called patient and enrolled her in [facility]. It turned out that her insurance coverage had changed from [place] to [place]. Attached is a PA Request for the [employer] coverage. Please complete form and submit to the insurance for review.    Received notification that prior authorization for Tyvaso DPI is required.   PA submitted on CoverMyMeds Key [id number] Status is pending   Will continue to follow.   Audry Riles, PharmD, BCPS, BCCP, CPP Heart Failure Clinic Pharmacist [phone]

## 2022-01-30 NOTE — Telephone Encounter (Signed)
Spoke with patient to give her the date, time and location for her titration appointment. She states at this time she would like to cancel this appointment and continue to just use oxygen. She was recently discharged from the hospital and she is in the process of trying to obtain a larger oxygen concentrator. She will let us know if she changes her mind and wises to proceed with the titration.

## 2022-01-31 NOTE — Progress Notes (Signed)
PATIENT NAME: CARLY SABO DOB: 1941-10-17 MRN: [id number]  PRIMARY CARE PROVIDER: Kathyrn Lass, MD  RESPONSIBLE PARTY:  Acct ID - Guarantor Home Phone Work Phone Relationship Acct Type  [id number] Ernestene [phone]  Self P/F     [address], [place], [place] [address]  Due to the COVID-19 crisis, this virtual check-in visit was done via telephone from my office and it was initiated and consent by this patient and or family.   Spoke with patient's daughter who shared patient is currently hospitalized. Daughter would like to take her mom home after hospitalization instead of having her sent back to SNF for rehab. Discussed home health services and that this can be ordered through the hospital. Daughter awaiting return call from [facility] facility with an update from the rehab department on the status of how patient was progressing there. Encouraged daughter to voice her desire for home health to hospital case manager and have that set up prior to discharge. Daughter expressed understanding and appreciation.  Cornelius Moras, RN

## 2022-01-31 NOTE — Telephone Encounter (Signed)
Advanced Heart Failure Patient Advocate Encounter  Prior Authorization for Tyvaso DPI has been approved.    Effective dates: 01/31/22 through 12/10/22  Approval sent to [employer].   Audry Riles, PharmD, BCPS, BCCP, CPP Heart Failure Clinic Pharmacist [phone]

## 2022-02-02 ENCOUNTER — Telehealth (HOSPITAL_COMMUNITY): Payer: Self-pay

## 2022-02-02 NOTE — Telephone Encounter (Signed)
Elizabeth Melton from [facility] called to report that patients blood pressure has been runnig low (100/58, 100/48) and wants to know if patient can stop the metoprolol completely. Please advise

## 2022-02-02 NOTE — Telephone Encounter (Signed)
Would not change at that BP unless she is lightheaded.

## 2022-02-03 DIAGNOSIS — N952 Postmenopausal atrophic vaginitis: Secondary | ICD-10-CM | POA: Diagnosis not present

## 2022-02-03 DIAGNOSIS — R300 Dysuria: Secondary | ICD-10-CM | POA: Diagnosis not present

## 2022-02-03 DIAGNOSIS — Z01419 Encounter for gynecological examination (general) (routine) without abnormal findings: Secondary | ICD-10-CM | POA: Diagnosis not present

## 2022-02-03 DIAGNOSIS — L299 Pruritus, unspecified: Secondary | ICD-10-CM | POA: Diagnosis not present

## 2022-02-03 NOTE — Telephone Encounter (Signed)
Pt aware and voiced understanding 

## 2022-02-07 DIAGNOSIS — R062 Wheezing: Secondary | ICD-10-CM | POA: Diagnosis not present

## 2022-02-07 DIAGNOSIS — J80 Acute respiratory distress syndrome: Secondary | ICD-10-CM | POA: Diagnosis not present

## 2022-02-07 DIAGNOSIS — R0902 Hypoxemia: Secondary | ICD-10-CM | POA: Diagnosis not present

## 2022-02-07 DIAGNOSIS — R739 Hyperglycemia, unspecified: Secondary | ICD-10-CM | POA: Diagnosis not present

## 2022-02-08 ENCOUNTER — Telehealth: Payer: Self-pay | Admitting: Internal Medicine

## 2022-02-08 IMAGING — DX DG CHEST 2V
2 series · 2 of 2 positions shown · non-contrast
Comparison: Previous studies including the examination of
12/28/2021

CLINICAL DATA: Shortness of breath

EXAM:
CHEST - 2 VIEW

[x chest ap]
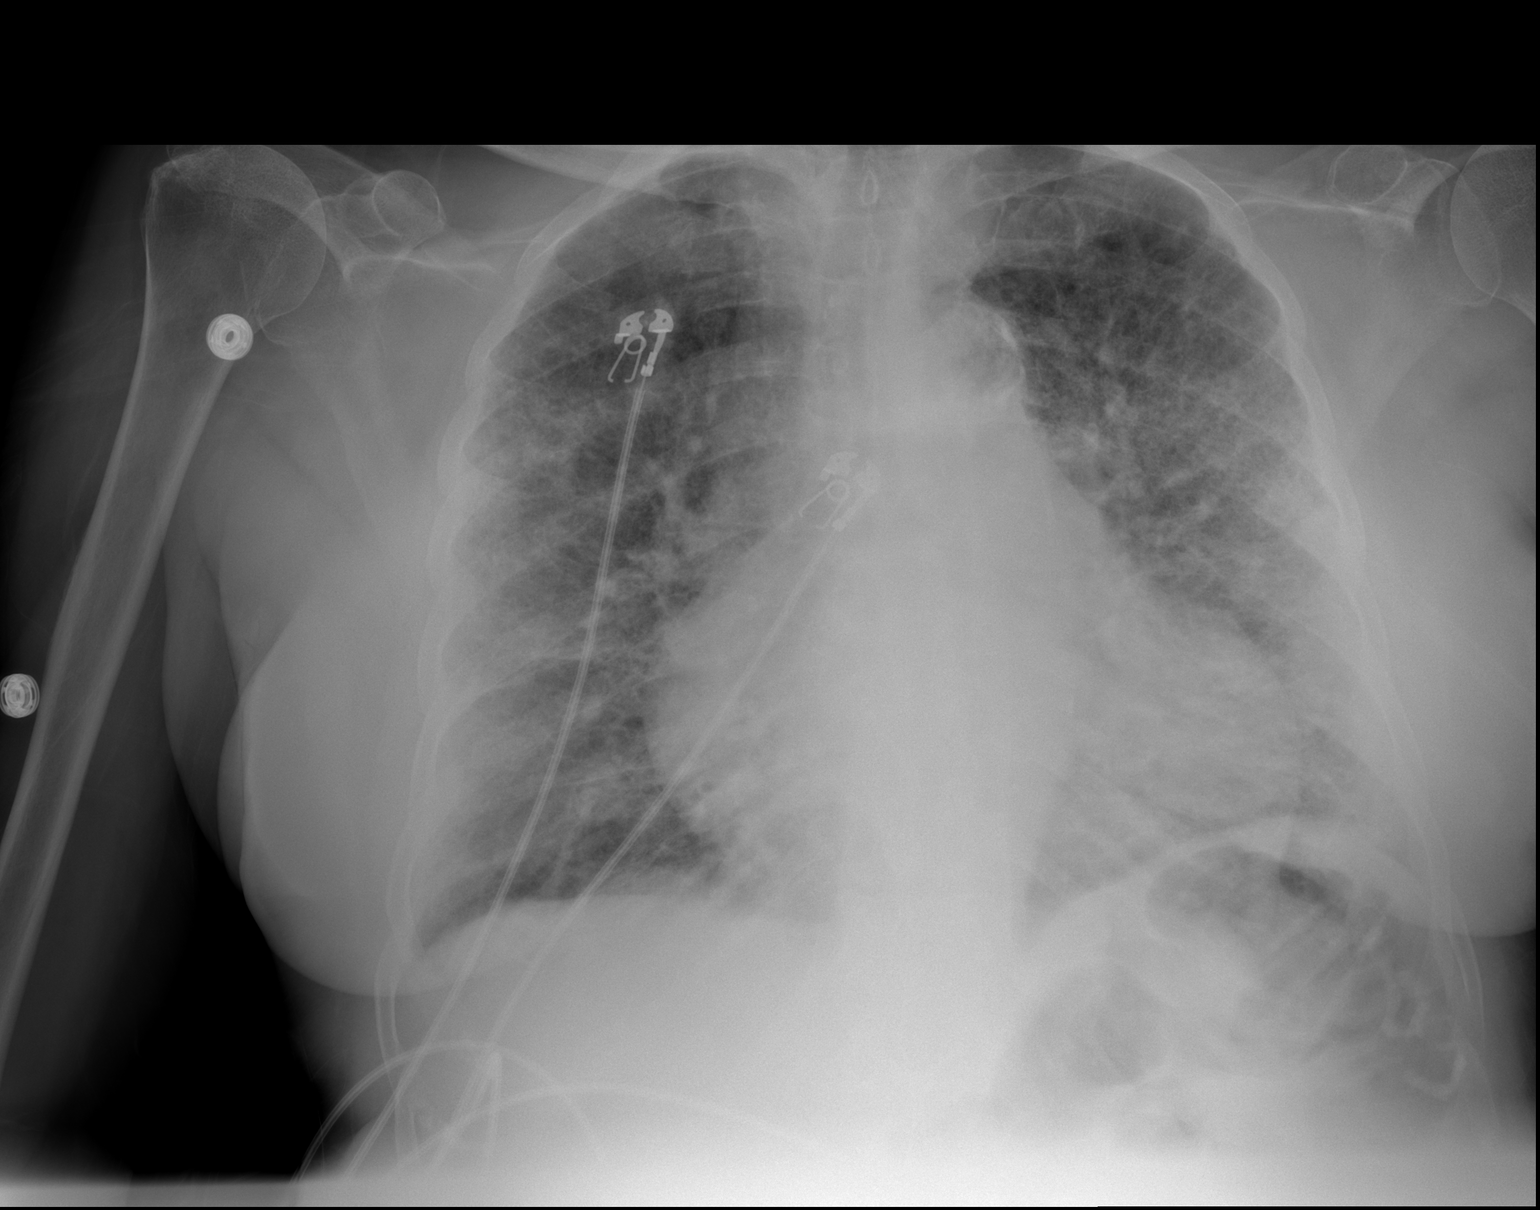

[w chest lat]
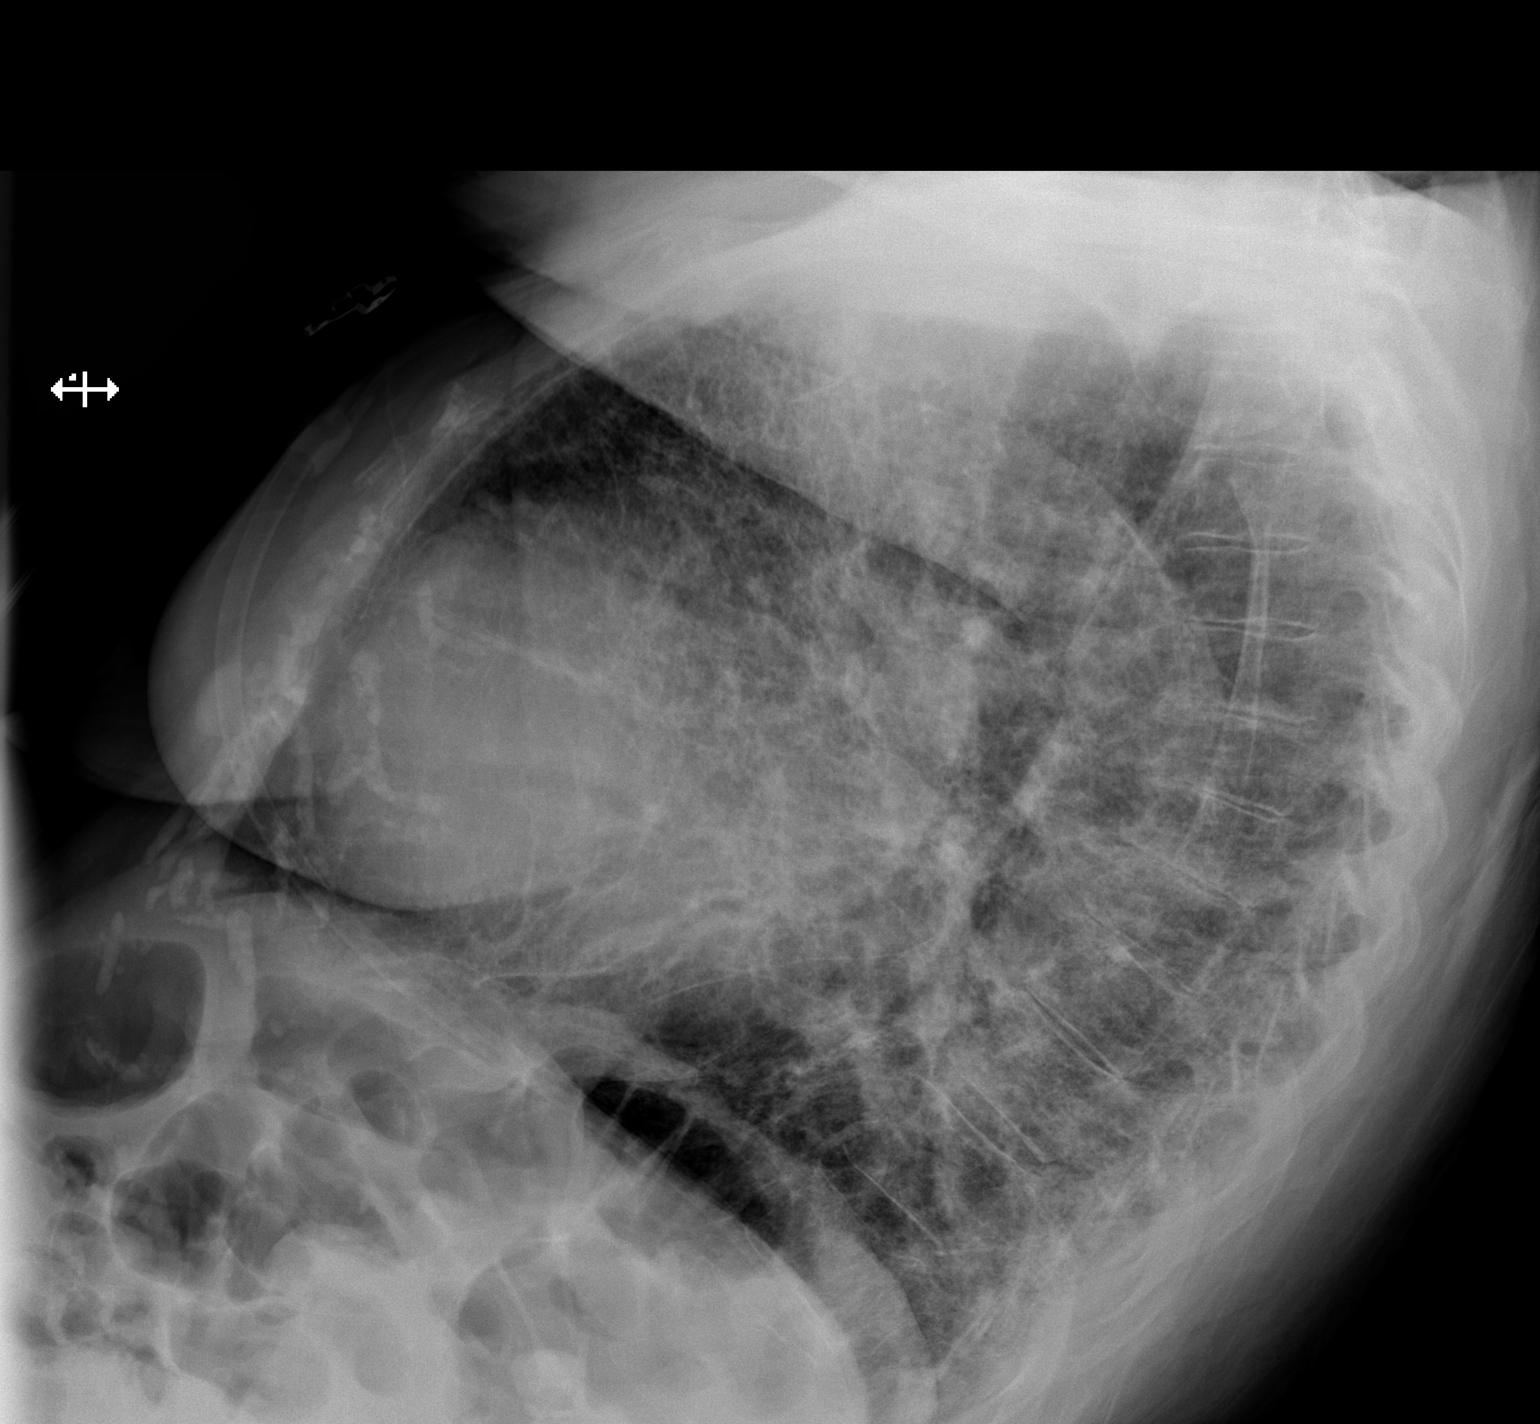

[2 of 2 positions shown; findings below may reference images not displayed]

FINDINGS: Transverse diameter of heart is increased. There is diffuse increase
in interstitial markings in both lungs. There is interval
improvement in aeration in the right mid and right lower lung
fields. No new focal pulmonary consolidation is seen. Lateral CP
angles are clear. There is no pneumothorax.
IMPRESSION: Pulmonary fibrosis. Cardiomegaly. There is interval improvement in
aeration in the right parahilar region and right lower lung fields
suggesting resolving pulmonary edema or resolving pneumonia
superimposed over pulmonary fibrosis.

## 2022-02-08 MED ORDER — ALBUTEROL SULFATE HFA 108 (90 BASE) MCG/ACT IN AERS: 2.0000 | INHALATION_SPRAY | Freq: Four times a day (QID) | RESPIRATORY_TRACT | 6 refills | Status: AC | PRN

## 2022-02-08 NOTE — Telephone Encounter (Signed)
Called and spoke with pt's daughter [name] clarifying if Rx for albuterol inhaler or neb sol needed to be sent in. After getting this clarified, Rx for albuterol inhaler has been sent to pharmacy. Nothing further needed. ?

## 2022-02-09 ENCOUNTER — Encounter: Payer: Self-pay | Admitting: Family Medicine

## 2022-02-09 ENCOUNTER — Other Ambulatory Visit: Payer: Self-pay

## 2022-02-09 ENCOUNTER — Other Ambulatory Visit: Payer: Self-pay | Admitting: Family Medicine

## 2022-02-09 ENCOUNTER — Emergency Department (HOSPITAL_COMMUNITY): Payer: Medicare HMO

## 2022-02-09 ENCOUNTER — Inpatient Hospital Stay (HOSPITAL_COMMUNITY)
Admission: EM | Admit: 2022-02-09 | Discharge: 2022-02-21 | DRG: 196 | Disposition: A | Discharge status: Home Health | Payer: Medicare HMO | Attending: Internal Medicine | Admitting: Internal Medicine

## 2022-02-09 VITALS — BP 112/60 | Temp 97.8°F | Resp 24

## 2022-02-09 DIAGNOSIS — Z7189 Other specified counseling: Secondary | ICD-10-CM | POA: Diagnosis not present

## 2022-02-09 DIAGNOSIS — N189 Chronic kidney disease, unspecified: Secondary | ICD-10-CM | POA: Diagnosis not present

## 2022-02-09 DIAGNOSIS — U071 COVID-19: Secondary | ICD-10-CM | POA: Diagnosis present

## 2022-02-09 DIAGNOSIS — R918 Other nonspecific abnormal finding of lung field: Secondary | ICD-10-CM | POA: Diagnosis not present

## 2022-02-09 DIAGNOSIS — I130 Hypertensive heart and chronic kidney disease with heart failure and stage 1 through stage 4 chronic kidney disease, or unspecified chronic kidney disease: Secondary | ICD-10-CM | POA: Diagnosis present

## 2022-02-09 DIAGNOSIS — I10 Essential (primary) hypertension: Secondary | ICD-10-CM | POA: Diagnosis present

## 2022-02-09 DIAGNOSIS — F17210 Nicotine dependence, cigarettes, uncomplicated: Secondary | ICD-10-CM | POA: Diagnosis present

## 2022-02-09 DIAGNOSIS — J84112 Idiopathic pulmonary fibrosis: Principal | ICD-10-CM | POA: Diagnosis present

## 2022-02-09 DIAGNOSIS — I4891 Unspecified atrial fibrillation: Secondary | ICD-10-CM

## 2022-02-09 DIAGNOSIS — I2729 Other secondary pulmonary hypertension: Secondary | ICD-10-CM | POA: Diagnosis present

## 2022-02-09 DIAGNOSIS — I480 Paroxysmal atrial fibrillation: Secondary | ICD-10-CM | POA: Diagnosis present

## 2022-02-09 DIAGNOSIS — I499 Cardiac arrhythmia, unspecified: Secondary | ICD-10-CM | POA: Diagnosis not present

## 2022-02-09 DIAGNOSIS — J9621 Acute and chronic respiratory failure with hypoxia: Secondary | ICD-10-CM | POA: Diagnosis present

## 2022-02-09 DIAGNOSIS — Z9981 Dependence on supplemental oxygen: Secondary | ICD-10-CM

## 2022-02-09 DIAGNOSIS — Z711 Person with feared health complaint in whom no diagnosis is made: Secondary | ICD-10-CM | POA: Diagnosis not present

## 2022-02-09 DIAGNOSIS — Z86711 Personal history of pulmonary embolism: Secondary | ICD-10-CM

## 2022-02-09 DIAGNOSIS — I4892 Unspecified atrial flutter: Secondary | ICD-10-CM | POA: Diagnosis present

## 2022-02-09 DIAGNOSIS — J849 Interstitial pulmonary disease, unspecified: Secondary | ICD-10-CM | POA: Diagnosis not present

## 2022-02-09 DIAGNOSIS — Z79899 Other long term (current) drug therapy: Secondary | ICD-10-CM

## 2022-02-09 DIAGNOSIS — I5032 Chronic diastolic (congestive) heart failure: Secondary | ICD-10-CM | POA: Diagnosis present

## 2022-02-09 DIAGNOSIS — Z9851 Tubal ligation status: Secondary | ICD-10-CM

## 2022-02-09 DIAGNOSIS — J438 Other emphysema: Secondary | ICD-10-CM | POA: Diagnosis present

## 2022-02-09 DIAGNOSIS — G4733 Obstructive sleep apnea (adult) (pediatric): Secondary | ICD-10-CM | POA: Diagnosis present

## 2022-02-09 DIAGNOSIS — I2510 Atherosclerotic heart disease of native coronary artery without angina pectoris: Secondary | ICD-10-CM | POA: Diagnosis present

## 2022-02-09 DIAGNOSIS — Z515 Encounter for palliative care: Secondary | ICD-10-CM

## 2022-02-09 DIAGNOSIS — I272 Pulmonary hypertension, unspecified: Secondary | ICD-10-CM

## 2022-02-09 DIAGNOSIS — N63 Unspecified lump in unspecified breast: Secondary | ICD-10-CM | POA: Diagnosis present

## 2022-02-09 DIAGNOSIS — E1169 Type 2 diabetes mellitus with other specified complication: Secondary | ICD-10-CM | POA: Diagnosis present

## 2022-02-09 DIAGNOSIS — F39 Unspecified mood [affective] disorder: Secondary | ICD-10-CM | POA: Diagnosis present

## 2022-02-09 DIAGNOSIS — I455 Other specified heart block: Secondary | ICD-10-CM | POA: Diagnosis not present

## 2022-02-09 DIAGNOSIS — K5900 Constipation, unspecified: Secondary | ICD-10-CM | POA: Diagnosis not present

## 2022-02-09 DIAGNOSIS — E1122 Type 2 diabetes mellitus with diabetic chronic kidney disease: Secondary | ICD-10-CM | POA: Diagnosis present

## 2022-02-09 DIAGNOSIS — R000 Tachycardia, unspecified: Secondary | ICD-10-CM | POA: Diagnosis not present

## 2022-02-09 DIAGNOSIS — I495 Sick sinus syndrome: Secondary | ICD-10-CM | POA: Diagnosis present

## 2022-02-09 DIAGNOSIS — N1831 Chronic kidney disease, stage 3a: Secondary | ICD-10-CM | POA: Diagnosis present

## 2022-02-09 DIAGNOSIS — E1165 Type 2 diabetes mellitus with hyperglycemia: Secondary | ICD-10-CM | POA: Diagnosis present

## 2022-02-09 DIAGNOSIS — Z789 Other specified health status: Secondary | ICD-10-CM | POA: Diagnosis not present

## 2022-02-09 DIAGNOSIS — Z79890 Hormone replacement therapy (postmenopausal): Secondary | ICD-10-CM

## 2022-02-09 DIAGNOSIS — J1282 Pneumonia due to coronavirus disease 2019: Secondary | ICD-10-CM | POA: Diagnosis present

## 2022-02-09 DIAGNOSIS — E871 Hypo-osmolality and hyponatremia: Secondary | ICD-10-CM | POA: Diagnosis not present

## 2022-02-09 DIAGNOSIS — N179 Acute kidney failure, unspecified: Secondary | ICD-10-CM | POA: Diagnosis present

## 2022-02-09 DIAGNOSIS — J441 Chronic obstructive pulmonary disease with (acute) exacerbation: Secondary | ICD-10-CM | POA: Diagnosis not present

## 2022-02-09 DIAGNOSIS — Z794 Long term (current) use of insulin: Secondary | ICD-10-CM

## 2022-02-09 DIAGNOSIS — R0600 Dyspnea, unspecified: Secondary | ICD-10-CM | POA: Diagnosis not present

## 2022-02-09 DIAGNOSIS — E875 Hyperkalemia: Secondary | ICD-10-CM | POA: Diagnosis not present

## 2022-02-09 DIAGNOSIS — Z83438 Family history of other disorder of lipoprotein metabolism and other lipidemia: Secondary | ICD-10-CM

## 2022-02-09 DIAGNOSIS — Z833 Family history of diabetes mellitus: Secondary | ICD-10-CM

## 2022-02-09 DIAGNOSIS — R911 Solitary pulmonary nodule: Secondary | ICD-10-CM | POA: Diagnosis not present

## 2022-02-09 DIAGNOSIS — Z720 Tobacco use: Secondary | ICD-10-CM | POA: Diagnosis present

## 2022-02-09 DIAGNOSIS — E039 Hypothyroidism, unspecified: Secondary | ICD-10-CM | POA: Diagnosis present

## 2022-02-09 DIAGNOSIS — Z28310 Unvaccinated for covid-19: Secondary | ICD-10-CM

## 2022-02-09 DIAGNOSIS — E785 Hyperlipidemia, unspecified: Secondary | ICD-10-CM | POA: Diagnosis present

## 2022-02-09 DIAGNOSIS — Z841 Family history of disorders of kidney and ureter: Secondary | ICD-10-CM

## 2022-02-09 DIAGNOSIS — Z888 Allergy status to other drugs, medicaments and biological substances status: Secondary | ICD-10-CM

## 2022-02-09 DIAGNOSIS — R590 Localized enlarged lymph nodes: Secondary | ICD-10-CM | POA: Diagnosis not present

## 2022-02-09 DIAGNOSIS — Z8249 Family history of ischemic heart disease and other diseases of the circulatory system: Secondary | ICD-10-CM

## 2022-02-09 DIAGNOSIS — Z7951 Long term (current) use of inhaled steroids: Secondary | ICD-10-CM

## 2022-02-09 DIAGNOSIS — Z7901 Long term (current) use of anticoagulants: Secondary | ICD-10-CM

## 2022-02-09 DIAGNOSIS — I517 Cardiomegaly: Secondary | ICD-10-CM | POA: Diagnosis not present

## 2022-02-09 LAB — I-STAT CHEM 8, ED
BUN: 55 mg/dL — ABNORMAL HIGH (ref 8–23)
Calcium, Ion: 1.43 mmol/L — ABNORMAL HIGH (ref 1.15–1.40)
Chloride: 111 mmol/L (ref 98–111)
Creatinine, Ser: 2.8 mg/dL — ABNORMAL HIGH (ref 0.44–1.00)
Glucose, Bld: 126 mg/dL — ABNORMAL HIGH (ref 70–99)
HCT: 31 % — ABNORMAL LOW (ref 36.0–46.0)
Hemoglobin: 10.5 g/dL — ABNORMAL LOW (ref 12.0–15.0)
Potassium: 4.3 mmol/L (ref 3.5–5.1)
Sodium: 140 mmol/L (ref 135–145)
TCO2: 21 mmol/L — ABNORMAL LOW (ref 22–32)

## 2022-02-09 LAB — CBC WITH DIFFERENTIAL/PLATELET
Abs Immature Granulocytes: 0.08 10*3/uL — ABNORMAL HIGH (ref 0.00–0.07)
Basophils Absolute: 0 10*3/uL (ref 0.0–0.1)
Basophils Relative: 0 %
Eosinophils Absolute: 0 10*3/uL (ref 0.0–0.5)
Eosinophils Relative: 0 %
HCT: 47.2 % — ABNORMAL HIGH (ref 36.0–46.0)
Hemoglobin: 15.8 g/dL — ABNORMAL HIGH (ref 12.0–15.0)
Immature Granulocytes: 1 %
Lymphocytes Relative: 17 %
Lymphs Abs: 2.4 10*3/uL (ref 0.7–4.0)
MCH: 33.5 pg (ref 26.0–34.0)
MCHC: 33.5 g/dL (ref 30.0–36.0)
MCV: 100 fL (ref 80.0–100.0)
Monocytes Absolute: 0.7 10*3/uL (ref 0.1–1.0)
Monocytes Relative: 5 %
Neutro Abs: 11 10*3/uL — ABNORMAL HIGH (ref 1.7–7.7)
Neutrophils Relative %: 77 %
Platelets: 153 10*3/uL (ref 150–400)
RBC: 4.72 MIL/uL (ref 3.87–5.11)
RDW: 17.9 % — ABNORMAL HIGH (ref 11.5–15.5)
WBC: 14.1 10*3/uL — ABNORMAL HIGH (ref 4.0–10.5)
nRBC: 0.6 % — ABNORMAL HIGH (ref 0.0–0.2)

## 2022-02-09 LAB — MAGNESIUM: Magnesium: 2.1 mg/dL (ref 1.7–2.4)

## 2022-02-09 LAB — RESP PANEL BY RT-PCR (FLU A&B, COVID) ARPGX2
Influenza A by PCR: NEGATIVE
Influenza B by PCR: NEGATIVE
SARS Coronavirus 2 by RT PCR: POSITIVE — AB

## 2022-02-09 LAB — CBG MONITORING, ED: Glucose-Capillary: 199 mg/dL — ABNORMAL HIGH (ref 70–99)

## 2022-02-09 MED ORDER — GUAIFENESIN ER 600 MG PO TB12
600.0000 mg | ORAL_TABLET | Freq: Two times a day (BID) | ORAL | Status: DC
  Administered 2022-02-09 – 2022-02-12 (×6): 600 mg via ORAL
  Filled 2022-02-09 (×6): qty 1

## 2022-02-09 MED ORDER — ASCORBIC ACID 500 MG PO TABS
500.0000 mg | ORAL_TABLET | Freq: Every day | ORAL | Status: DC
  Administered 2022-02-09 – 2022-02-20 (×12): 500 mg via ORAL
  Filled 2022-02-09 (×12): qty 1

## 2022-02-09 MED ORDER — ZINC SULFATE 220 (50 ZN) MG PO CAPS
220.0000 mg | ORAL_CAPSULE | Freq: Every day | ORAL | Status: DC
  Administered 2022-02-09 – 2022-02-20 (×12): 220 mg via ORAL
  Filled 2022-02-09 (×12): qty 1

## 2022-02-09 MED ORDER — SODIUM CHLORIDE 0.9% FLUSH
3.0000 mL | Freq: Two times a day (BID) | INTRAVENOUS | Status: DC
  Administered 2022-02-09 – 2022-02-21 (×22): 3 mL via INTRAVENOUS

## 2022-02-09 MED ORDER — TRAMADOL HCL 50 MG PO TABS
50.0000 mg | ORAL_TABLET | Freq: Four times a day (QID) | ORAL | Status: DC | PRN
  Filled 2022-02-09: qty 1

## 2022-02-09 MED ORDER — ONDANSETRON HCL 4 MG PO TABS: 4.0000 mg | ORAL_TABLET | Freq: Four times a day (QID) | ORAL | Status: DC | PRN

## 2022-02-09 MED ORDER — SODIUM CHLORIDE 0.9 % IV SOLN: INTRAVENOUS | Status: DC

## 2022-02-09 MED ORDER — MOLNUPIRAVIR EUA 200MG CAPSULE
4.0000 | ORAL_CAPSULE | Freq: Two times a day (BID) | ORAL | Status: AC
Start: 2022-02-09 — End: 2022-02-14
  Administered 2022-02-09 – 2022-02-14 (×10): 800 mg via ORAL
  Filled 2022-02-09: qty 4

## 2022-02-09 MED ORDER — PRAVASTATIN SODIUM 40 MG PO TABS
40.0000 mg | ORAL_TABLET | Freq: Every day | ORAL | Status: DC
  Administered 2022-02-09 – 2022-02-21 (×13): 40 mg via ORAL
  Filled 2022-02-09 (×14): qty 1

## 2022-02-09 MED ORDER — INSULIN ASPART 100 UNIT/ML IJ SOLN
0.0000 [IU] | Freq: Every day | INTRAMUSCULAR | Status: DC
  Administered 2022-02-12: 2 [IU] via SUBCUTANEOUS
  Administered 2022-02-13: 3 [IU] via SUBCUTANEOUS
  Administered 2022-02-15 – 2022-02-17 (×2): 2 [IU] via SUBCUTANEOUS

## 2022-02-09 MED ORDER — METOPROLOL SUCCINATE ER 25 MG PO TB24: 12.5000 mg | ORAL_TABLET | Freq: Every day | ORAL | Status: DC

## 2022-02-09 MED ORDER — HYDRALAZINE HCL 20 MG/ML IJ SOLN
5.0000 mg | INTRAMUSCULAR | Status: DC | PRN
  Filled 2022-02-09: qty 1

## 2022-02-09 MED ORDER — METOPROLOL TARTRATE 5 MG/5ML IV SOLN: 2.5000 mg | INTRAVENOUS | Status: AC

## 2022-02-09 MED ORDER — BUDESON-GLYCOPYRROL-FORMOTEROL 160-9-4.8 MCG/ACT IN AERO: 2.0000 | INHALATION_SPRAY | Freq: Two times a day (BID) | RESPIRATORY_TRACT | Status: DC

## 2022-02-09 MED ORDER — ALBUTEROL SULFATE (2.5 MG/3ML) 0.083% IN NEBU
2.5000 mg | INHALATION_SOLUTION | RESPIRATORY_TRACT | Status: DC | PRN
  Administered 2022-02-10 (×2): 2.5 mg via RESPIRATORY_TRACT
  Filled 2022-02-09 (×3): qty 3

## 2022-02-09 MED ORDER — LEVOTHYROXINE SODIUM 75 MCG PO TABS
75.0000 ug | ORAL_TABLET | Freq: Every day | ORAL | Status: DC
  Administered 2022-02-10 – 2022-02-21 (×12): 75 ug via ORAL
  Filled 2022-02-09 (×12): qty 1

## 2022-02-09 MED ORDER — BUPROPION HCL ER (XL) 150 MG PO TB24
150.0000 mg | ORAL_TABLET | Freq: Every day | ORAL | Status: DC
  Administered 2022-02-10 – 2022-02-21 (×12): 150 mg via ORAL
  Filled 2022-02-09 (×11): qty 1

## 2022-02-09 MED ORDER — ACETAMINOPHEN 650 MG RE SUPP: 650.0000 mg | Freq: Four times a day (QID) | RECTAL | Status: DC | PRN

## 2022-02-09 MED ORDER — PREDNISONE 10 MG (21) PO TBPK: ORAL_TABLET | Freq: Every day | ORAL | Status: DC

## 2022-02-09 MED ORDER — DULOXETINE HCL 30 MG PO CPEP
30.0000 mg | ORAL_CAPSULE | Freq: Every day | ORAL | Status: DC
  Administered 2022-02-10 – 2022-02-21 (×12): 30 mg via ORAL
  Filled 2022-02-09 (×12): qty 1

## 2022-02-09 MED ORDER — ACETAMINOPHEN 325 MG PO TABS
650.0000 mg | ORAL_TABLET | Freq: Four times a day (QID) | ORAL | Status: DC | PRN
  Filled 2022-02-09: qty 2

## 2022-02-09 MED ORDER — INSULIN ASPART 100 UNIT/ML IJ SOLN
0.0000 [IU] | Freq: Three times a day (TID) | INTRAMUSCULAR | Status: DC
  Administered 2022-02-10: 5 [IU] via SUBCUTANEOUS
  Administered 2022-02-10 – 2022-02-11 (×3): 3 [IU] via SUBCUTANEOUS
  Administered 2022-02-12: 5 [IU] via SUBCUTANEOUS
  Administered 2022-02-13: 8 [IU] via SUBCUTANEOUS
  Administered 2022-02-13 (×2): 3 [IU] via SUBCUTANEOUS
  Administered 2022-02-14: 11 [IU] via SUBCUTANEOUS
  Administered 2022-02-14: 3 [IU] via SUBCUTANEOUS
  Administered 2022-02-15 (×2): 5 [IU] via SUBCUTANEOUS
  Administered 2022-02-15: 8 [IU] via SUBCUTANEOUS
  Administered 2022-02-16 (×2): 3 [IU] via SUBCUTANEOUS
  Administered 2022-02-16: 05:00:00 8 [IU] via SUBCUTANEOUS
  Administered 2022-02-17: 3 [IU] via SUBCUTANEOUS
  Administered 2022-02-17: 2 [IU] via SUBCUTANEOUS
  Administered 2022-02-17: 8 [IU] via SUBCUTANEOUS
  Administered 2022-02-18: 3 [IU] via SUBCUTANEOUS
  Administered 2022-02-18 (×2): 5 [IU] via SUBCUTANEOUS
  Administered 2022-02-19: 2 [IU] via SUBCUTANEOUS
  Administered 2022-02-19 (×2): 3 [IU] via SUBCUTANEOUS
  Administered 2022-02-20: 8 [IU] via SUBCUTANEOUS
  Administered 2022-02-20: 3 [IU] via SUBCUTANEOUS
  Administered 2022-02-21 (×3): 8 [IU] via SUBCUTANEOUS

## 2022-02-09 MED ORDER — METHOCARBAMOL 500 MG PO TABS: 1000.0000 mg | ORAL_TABLET | Freq: Three times a day (TID) | ORAL | Status: DC | PRN

## 2022-02-09 MED ORDER — FLUTICASONE FUROATE-VILANTEROL 100-25 MCG/ACT IN AEPB
1.0000 | INHALATION_SPRAY | Freq: Every day | RESPIRATORY_TRACT | Status: DC
  Administered 2022-02-10 – 2022-02-12 (×3): 1 via RESPIRATORY_TRACT
  Filled 2022-02-09: qty 28

## 2022-02-09 MED ORDER — UMECLIDINIUM BROMIDE 62.5 MCG/ACT IN AEPB
1.0000 | INHALATION_SPRAY | Freq: Every day | RESPIRATORY_TRACT | Status: DC
  Administered 2022-02-10 – 2022-02-12 (×3): 1 via RESPIRATORY_TRACT
  Filled 2022-02-09: qty 7

## 2022-02-09 MED ORDER — SODIUM CHLORIDE 0.9 % IV BOLUS
500.0000 mL | Freq: Once | INTRAVENOUS | Status: AC
  Administered 2022-02-09: 500 mL via INTRAVENOUS

## 2022-02-09 MED ORDER — ONDANSETRON HCL 4 MG/2ML IJ SOLN: 4.0000 mg | Freq: Four times a day (QID) | INTRAMUSCULAR | Status: DC | PRN

## 2022-02-09 MED ORDER — APIXABAN 5 MG PO TABS
5.0000 mg | ORAL_TABLET | Freq: Two times a day (BID) | ORAL | Status: DC
Start: 2022-02-09 — End: 2022-02-21
  Administered 2022-02-09 – 2022-02-21 (×24): 5 mg via ORAL
  Filled 2022-02-09 (×24): qty 1

## 2022-02-09 MED ORDER — PANTOPRAZOLE SODIUM 40 MG PO TBEC
40.0000 mg | DELAYED_RELEASE_TABLET | Freq: Every day | ORAL | Status: DC
Start: 2022-02-10 — End: 2022-02-21
  Administered 2022-02-10 – 2022-02-21 (×12): 40 mg via ORAL
  Filled 2022-02-09 (×12): qty 1

## 2022-02-09 MED ORDER — INSULIN GLARGINE-YFGN 100 UNIT/ML ~~LOC~~ SOLN
10.0000 [IU] | Freq: Every day | SUBCUTANEOUS | Status: DC
  Administered 2022-02-10 – 2022-02-21 (×12): 10 [IU] via SUBCUTANEOUS
  Filled 2022-02-09 (×12): qty 0.1

## 2022-02-09 MED ORDER — NICOTINE 14 MG/24HR TD PT24
14.0000 mg | MEDICATED_PATCH | Freq: Every day | TRANSDERMAL | Status: DC
  Filled 2022-02-09 (×10): qty 1

## 2022-02-09 NOTE — Assessment & Plan Note (Addendum)
On Wellbutrin, Cymbalta ?

## 2022-02-09 NOTE — Consult Note (Addendum)
Cardiology Consultation:   Patient ID: Elizabeth Melton MRN: [id number]; DOB: March 22, 1941  Admit date: 02/09/2022 Date of Consult: 02/09/2022  PCP:  Kathyrn Lass, MD   [facility] Providers Cardiologist:  [name], MD   Patient Profile:   Elizabeth Melton is a 81 y.o. 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 Eulis Foster.  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  V/Q scan 08/2021 showed no acute or chronic PE.  She suffered COVID-19 pneumonia in February 2022 and was admitted to the hospital.  This was complicated by A-fib RVR treated with amiodarone.  Unfortunately LFTs increased and amiodarone was discontinued. Also, she was bradycardic on both amiodarone and metoprolol.   Echo 01/2021 did show LVEF 40-45%, but this normalized on subsequent echos.  Chest CT in 07/2021 showed ILD (UIP) as well as emphysema.  She is a longtime smoker.    [place] 06/2021 showed elevated wedge pressure and moderate pulmonary hypertension, primarily pulmonary venous hypertension.  She was able to quit smoking.    Echocardiogram 10/2021 showed LVEF of 60 to 65%, mildly decreased RV systolic function with mild RV enlargement, mild MR, moderate TR, mild AS, and mild AI.    [facility] 11/2021 showed nonobstructive CAD and normal filling pressures but severe pulmonary hypertension.  She follows with Dr. Aundra Dubin and was seen 12/20/2021 for [facility].  At that time, she was on 5 L nasal cannula at home.  Tyvaso was planned.   She was hospitalized 12/28/21-01/13/22 with hypoxic respiratory failure after missing some doses of her diuretic. At baseline, she was taking 20 mg torsemide BID instead of the recommended 40/20 mg regimen. She required BiPAP and  AHF was consulted.   She was diuresed and underwent repeat RHC 01/06/22 that showed optimized filling pressures and severe PAH. She was restarted on prednisone and ABX per pulmonology.   She was admitted again 01/25/22-01/28/22 with acute on chronic respiratory failure requiring BiPAP. She was treated for COPD exacerbation with underlying ILD. Cardiology was not consulted during that admission.   She presented back to the ER 03/02 for rapid heart rate. Elizabeth Melton nurse called EMS.  Per EMS, Elizabeth heart rate initially was 230, but converted to sinus in the 90s. Telemetry in the ER showed a post-termination pause with conversion to sinus. Cardiology was consulted for pause.   Of note, palliative care following outpatient.   Maintained on 12.5 mg toprol and eliquis at home.   Today, she went to a previously scheduled appointment.  There was concern because her oxygen levels were low and EMS was called.  EMS noted that she was in atrial fibrillation and then she had a pause, presumably as she converted to sinus rhythm.  Cardiology was asked to evaluate her.  [name] is not aware of the atrial fibrillation.  She does not know when she is in it or when she is out of it.  She is also unaware of the PVCs.  She denies being lightheaded, no presyncope or syncope.  Of note, her oxygen need has slowly been increasing.  In the last month or so, she has gone from 5 L/min up to 7 L/min.   Past Medical History:  Diagnosis Date  Acute on chronic diastolic (congestive) heart failure (HCC) 05/19/2021   Acute respiratory failure with hypoxia ([place]) 09/06/2020   Atrial fibrillation with RVR (HCC)    Diabetes mellitus without complication (HCC)    Hyperlipidemia    Hypertension    Hypothyroidism    IPF (idiopathic pulmonary fibrosis) ([place])    Pneumonia due to COVID-19 virus 01/16/2021   Thrombocytopenia [facility])     Past Surgical History:  Procedure Laterality Date   RIGHT HEART CATH N/A 07/05/2021   Procedure: RIGHT  HEART CATH;  Surgeon: [name], MD;  Location: [place] [facility];  Service: Cardiovascular;  Laterality: N/A;   RIGHT HEART CATH N/A 01/06/2022   Procedure: RIGHT HEART CATH;  Surgeon: Larey Dresser, MD;  Location: [place] [facility];  Service: Cardiovascular;  Laterality: N/A;   RIGHT/LEFT HEART CATH AND CORONARY ANGIOGRAPHY N/A 11/23/2021   Procedure: RIGHT/LEFT HEART CATH AND CORONARY ANGIOGRAPHY;  Surgeon: Larey Dresser, MD;  Location: [place] [facility];  Service: Cardiovascular;  Laterality: N/A;   TUBAL LIGATION       Home Medications:  Prior to Admission medications   Medication Sig Start Date End Date Taking? Authorizing Provider  acetaminophen (TYLENOL) 500 MG tablet Take 1,000 mg by mouth every 8 (eight) hours as needed for headache or mild pain (pain).    [provider]  albuterol (PROVENTIL) (2.5 MG/3ML) 0.083% nebulizer solution Take 3 mLs (2.5 mg total) by nebulization every 4 (four) hours as needed for wheezing or shortness of breath. 01/13/22   Domenic Polite, MD  albuterol (VENTOLIN HFA) 108 (90 Base) MCG/ACT inhaler Inhale 2 puffs into the lungs every 6 (six) hours as needed for wheezing or shortness of breath. 02/08/22   Brand Males, MD  apixaban (ELIQUIS) 5 MG TABS tablet Take 1 tablet (5 mg total) by mouth 2 (two) times daily. 11/24/21   Larey Dresser, MD  b complex vitamins tablet Take 1 tablet by mouth daily.    [provider]  bisacodyl (DULCOLAX) 10 MG suppository Place 10 mg rectally daily as needed for moderate constipation.    [provider]  Blood Glucose Monitoring Suppl (TRUE METRIX METER) w/Device KIT 1 each by Other route in the morning and at bedtime. 12/25/20   [provider]  Budeson-Glycopyrrol-Formoterol (BREZTRI AEROSPHERE) 160-9-4.8 MCG/ACT AERO Inhale 2 puffs into the lungs in the morning and at bedtime. 01/25/22   Cobb, Karie Schwalbe, NP  buPROPion (WELLBUTRIN XL) 150 MG 24 hr tablet Take 1 tablet  (150 mg total) by mouth daily. 12/20/21   Larey Dresser, MD  Calcium Carb-Cholecalciferol (CALCIUM + D3) 600-200 MG-UNIT TABS Take 2 tablets by mouth daily.    [provider]  diphenhydrAMINE (BENADRYL) 25 MG tablet Take 25 mg by mouth every 6 (six) hours as needed for allergies.    [provider]  DULoxetine (CYMBALTA) 30 MG capsule Take 1 capsule (30 mg total) by mouth daily. 01/14/22   Domenic Polite, MD  glucose blood test strip 1 each by Other route as needed for other (blood sugar).    [provider]  guaiFENesin (MUCINEX) 600 MG 12 hr tablet Take 1 tablet (600 mg total) by mouth 2 (two) times daily. Patient taking differently: Take 600 mg by mouth 2 (two) times daily. As needed 01/13/22   Domenic Polite, MD  insulin glargine (LANTUS) 100 UNIT/ML injection Inject 10 Units into the skin daily.    [provider]  Insulin Pen Needle (PEN NEEDLES  29GX1/2") 29G X 12MM MISC For insulin injection 05/22/21   [name], MD  levothyroxine (SYNTHROID, LEVOTHROID) 75 MCG tablet Take 75 mcg by mouth daily before breakfast.     [provider]  lidocaine (LIDODERM) 5 % Place 1 patch onto the skin at bedtime. Remove & Discard patch within 12 hours or as directed by MD 01/13/22   Domenic Polite, MD  lovastatin (MEVACOR) 40 MG tablet Take 40 mg by mouth at bedtime.    [provider]  methocarbamol 1000 MG TABS Take 1,000 mg by mouth every 8 (eight) hours as needed for muscle spasms. 01/13/22   Domenic Polite, MD  metoprolol succinate (TOPROL-XL) 25 MG 24 hr tablet Take 0.5 tablets (12.5 mg total) by mouth daily. 01/28/21   [name], MD  nicotine (NICODERM CQ - DOSED IN MG/24 HOURS) 14 mg/24hr patch Place 14 mg onto the skin daily.    [provider]  Omega-3 Fatty Acids (FISH OIL) 1000 MG CAPS Take 1,000 mg by mouth daily.    [provider]  OXYGEN Inhale 4 L into the lungs continuous.    [provider]  pantoprazole  (PROTONIX) 40 MG tablet Take 1 tablet (40 mg total) by mouth daily. 01/14/22   Domenic Polite, MD  Potassium Chloride (KLOR-CON PO) Take 20 mEq by mouth daily.    [provider]  predniSONE (STERAPRED UNI-PAK 21 TAB) 10 MG (21) TBPK tablet Start 60 mg po daily, taper 10 mg daily until finish 01/28/22   Max Sane, MD  senna-docusate (SENOKOT-S) 8.6-50 MG tablet Take 2 tablets by mouth at bedtime. Patient taking differently: Take 2 tablets by mouth at bedtime. As needed 01/13/22   Domenic Polite, MD  SMART SENSE THIN LANCETS 26G MISC 1 each by Does not apply route 2 (two) times daily.    [provider]  torsemide (DEMADEX) 20 MG tablet Patient takes 2 tablets once daily.    [provider]  traMADol (ULTRAM) 50 MG tablet Take 1 tablet (50 mg total) by mouth every 6 (six) hours as needed for moderate pain. 01/13/22   Domenic Polite, MD  Treprostinil (TYVASO DPI TITRATION KIT) 16 & 32 & 48 MCG POWD Inhale into the lungs. Inhale 1 breath 4 times per day    [provider]  VITAMIN D PO Take 1,000 Units by mouth daily.    [provider]    Inpatient Medications: Scheduled Meds:  apixaban  5 mg Oral BID   vitamin C  500 mg Oral Daily   Budeson-Glycopyrrol-Formoterol  2 puff Inhalation BID   [START ON 02/10/2022] buPROPion  150 mg Oral Daily   [START ON 02/10/2022] DULoxetine  30 mg Oral Daily   guaiFENesin  600 mg Oral BID   [START ON 02/10/2022] insulin aspart  0-15 Units Subcutaneous TID WC   insulin aspart  0-5 Units Subcutaneous QHS   [START ON 02/10/2022] insulin glargine-yfgn  10 Units Subcutaneous Daily   [START ON 02/10/2022] levothyroxine  75 mcg Oral QAC breakfast   [START ON 02/10/2022] metoprolol succinate  12.5 mg Oral Daily   molnupiravir EUA  4 capsule Oral BID   nicotine  14 mg Transdermal Daily   [START ON 02/10/2022] pantoprazole  40 mg Oral Daily   pravastatin  40 mg Oral q1800   sodium chloride flush  3 mL Intravenous Q12H   sodium chloride  flush  3 mL Intravenous Q12H   zinc sulfate  220 mg Oral Daily   Continuous Infusions:  PRN Meds:   Allergies:    Allergies  Allergen Reactions   Lisinopril Other (See Comments)    Other reaction(s): cough 09/20/17    Social History:   Social History   Socioeconomic History   Marital status: Widowed    Spouse name: Not on file   Number of children: Not on file   Years of education: Not on file   Highest education level: Not on file  Occupational History   Occupation: retired  Tobacco Use   Smoking status: Some Days    Packs/day: 1.00    Years: 60.00    Pack years: 60.00    Types: Cigarettes    Start date: 12/11/1958   Smokeless tobacco: Former    Quit date: 09/24/1962   Tobacco comments:    3cigs per day as of 12/15/21 ep  Vaping Use   Vaping Use: Never used  Substance and Sexual Activity   Alcohol use: Yes    Comment: drinks beer 12 every 2 weeks   Drug use: No   Sexual activity: Not on file  Other Topics Concern   Not on file  Social History Narrative   Not on file   Social Determinants of Health   Financial Resource Strain: Not on file  Food Insecurity: No Food Insecurity   Worried About [place] in the Last Year: Never true   [place] in the Last Year: Never true  Transportation Needs: No Transportation Needs   Lack of Transportation (Medical): No   Lack of Transportation (Non-Medical): No  Physical Activity: Not on file  Stress: Not on file  Social Connections: Not on file  Intimate Partner Violence: Not on file    Family History:    Family History  Problem Relation Age of Onset   Diabetes Mother    Kidney disease Mother    Hypertension Mother    Other Father        tuberculosis   Hypertension Sister    Hyperlipidemia Sister      ROS:  Please see the history of present illness.   All other ROS reviewed and negative.     Physical Exam/Data:   Vitals:   02/09/22 1735 02/09/22 1745 02/09/22 1800 02/09/22 1815  BP:  (!) 142/90 (!) 135/100 (!) 137/58 132/65  Pulse: 83 74 91 83  Resp: _0 Temp:      TempSrc:      SpO2: 90% 90%  92%  Weight:      Height:       No intake or output data in the 24 hours ending 02/09/22 1837 Last 3 Weights 02/09/2022 02/09/2022 01/26/2022  Weight (lbs) 155 lb 170 lb 160 lb 8 oz  Weight (kg) 70.308 kg 77.111 kg 72.802 kg     Body mass index is 25.02 kg/m.  General:  Well nourished, well developed, in respiratory distress HEENT: normal Neck: JVD 10-11 cm Vascular: No carotid bruits; Distal pulses 2+ bilaterally Cardiac:  normal S1, S2; RRR; 2-3/6 murmur  Lungs: Rales bilaterally, no wheezing, rhonchi Abd: soft, nontender, no hepatomegaly  Ext: no edema Musculoskeletal:  No deformities, BUE and BLE strength normal and equal Skin: warm and dry  Neuro:  CNs 2-12 intact, no focal abnormalities noted Psych:  Normal affect   EKG:  The EKG was personally reviewed and demonstrates: EMS ECG is atrial fibrillation versus flutter, RVR, heart rate approximately 150 Telemetry:  Telemetry was personally reviewed and demonstrates: Sinus rhythm with PVCs;  EMS strips show atrial flutter>> sinus rhythm with approximately 5-second pause  Relevant CV Studies:  RHC 01/06/22: Hemodynamics (mmHg) RA 5 RV 72/7 PA 68/27, mean 42 PCWP mean 9 Oxygen saturations: PA 62% AO 96% Cardiac Output (Fick) 3.38  Cardiac Index (Fick) 1.83 PVR 9.7 WU  CARDIAC CATH: 11/23/2021 1. Normal filling pressures.  2. Severe pulmonary arterial hypertension.  3. Low cardiac output (though oxygen saturation was also low) 4. Nonobstructive mild CAD.    Plan to start Tyvaso for PH-ILD.  Also, needs to increase home oxygen for now to 5L [place].   ECHO: 11/04/2021  1. Left ventricular ejection fraction, by estimation, is 60 to 65%. The  left ventricle has normal function. The left ventricle has no regional  wall motion abnormalities. Left ventricular diastolic parameters were  normal.   2. Right  ventricular systolic function is mildly reduced. The right  ventricular size is mildly enlarged. There is severely elevated pulmonary  artery systolic pressure. The tricuspid regurgitant velocity is 4.62 m/s, and with an assumed right atrial pressure of 10 mmHg, the estimated right ventricular systolic pressure is 10.6 mmHg.   3. Left atrial size was mildly dilated.   4. Right atrial size was moderately dilated.   5. The pericardial effusion is posterior to the left ventricle.   6. The mitral valve is abnormal. Mild mitral valve regurgitation. No  evidence of mitral stenosis. Moderate mitral annular calcification.   7. Significant pulmonary HTN based on TR velocity . Tricuspid valve  regurgitation is moderate.   8. CW across AV not done Manual trace from AR tracing shows stable mean gradient of 9 mmHg . The aortic valve is normal in structure. Aortic valve regurgitation is mild. Mild aortic valve stenosis.   9. The inferior vena cava is dilated in size with <50% respiratory  variability, suggesting right atrial pressure of 15 mmHg.   Laboratory Data:  High Sensitivity Troponin:   Recent Labs  Lab 01/25/22 2356 01/26/22 0320  TROPONINIHS 29* 31*     Chemistry Recent Labs  Lab 02/09/22 1522 02/09/22 1546  NA  --  140  K  --  4.3  CL  --  111  GLUCOSE  --  126*  BUN  --  55*  CREATININE  --  2.80*  MG 2.1  --     Lab Results  Component Value Date   ALT 22 01/25/2022   AST 29 01/25/2022   ALKPHOS 71 01/25/2022   BILITOT 0.9 01/25/2022    Lipids No results for input(s): CHOL, TRIG, HDL, LABVLDL, LDLCALC, CHOLHDL in the last 168 hours.  Hematology Recent Labs  Lab 02/09/22 1441 02/09/22 1546  WBC 14.1*  --   RBC 4.72  --   HGB 15.8* 10.5*  HCT 47.2* 31.0*  MCV 100.0  --   MCH 33.5  --   MCHC 33.5  --   RDW 17.9*  --   PLT 153  --    Thyroid  Lab Results  Component Value Date   TSH 2.028 05/19/2021   Covid-19 Nucleic Acid Test Results Lab Results  Component  Value Date   SARSCOV2NAA POSITIVE (A) 02/09/2022   SARSCOV2NAA NEGATIVE 01/25/2022   [place] NEGATIVE 01/12/2022   SARSCOV2NAA NEGATIVE 12/28/2021   [place] NEGATIVE 11/03/2021      BNPNo results for input(s): BNP, PROBNP in the last 168 hours.  DDimer No results for input(s): DDIMER in the last 168 hours.   Radiology/Studies:  [facility] Chest Port [address]  Result Date: 02/09/2022 CLINICAL DATA:  Cardiac arrhythmia. EXAM: PORTABLE CHEST 1 VIEW COMPARISON:  January 25, 2022. FINDINGS: Stable cardiomegaly. Stable interstitial densities are noted throughout both lungs concerning for fibrosis or chronic scarring. Bony thorax is unremarkable. IMPRESSION: Stable cardiomegaly. Stable bilateral interstitial densities are noted concerning for fibrosis or chronic scarring. Electronically Signed   By: Marijo Conception M.D.   On: 02/09/2022 15:55     Assessment and Plan:   Atrial fibrillation with conversion pauses - maintained on 12.5 mg toprol -Discuss with MD if we can continue this, considering the length of the posttermination pauses she was having -Asymptomatic posttermination pauses are not indication for pacemaker  Chronic anticoagulation - continue eliquis 5 mg BID  Pulmonary hypertension -Patient says she never got the tyvaso -IM to clarify  HFpEF CKD stage IIIa - continue home diuretic  ILD - following with pulmonary -Home oxygen needs higher than previous  COVID POSITIVE -Being started on molnupiravir by IM   Risk Assessment/Risk Scores:       New York Heart Association (NYHA) Functional Class NYHA Class IV  CHA2DS2-VASc Score = 6   This indicates a 9.7% annual risk of stroke. The patient's score is based upon: CHF History: 1 HTN History: 1 Diabetes History: 0 Stroke History: 0 Vascular Disease History: 1 Age Score: 2 Gender Score: 1    For questions or updates, please contact [place] Please consult [email or web address] for contact info under     Signed, Rosaria Ferries, PA-C  02/09/2022 6:37 PM  Agree with above.   81 year old with end-stage interstitial lung disease now on 8 L at home with atrial fibrillation, now COVID-positive, diastolic dysfunction, chronic kidney disease stage IIIa and pulmonary hypertension followed by Loralie Champagne, MD and advanced heart failure clinic here with shortness of breath.  COVID-positive here in the emergency room.  While she has been in the emergency room, Dr. Eulis Foster observed atrial flutter converting back to sinus rhythm with a 5-second postconversion pause.  She has been on very low-dose metoprolol 12.5 mg once a day.  This has been discontinued.  Given her underlying medical condition, end-stage interstitial lung disease, COPD, RV failure, pulmonary hypertension, atrial fibrillation intolerant to amiodarone with abnormal LFTs, we will start with discontinuation of AV nodal blocking agent.  I am concerned that she will develop atrial flutter/fibrillation with rapid ventricular response once again.  We will address at that time likely with low-dose beta-blocker once again.  Unfortunately her tachycardia/bradycardia syndrome would normally be addressed with pacemaker implantation.  With active COVID as well as severe underlying comorbidities which seem to be progressive, she is not be a good candidate at this time for further invasive therapies.  Thankfully she was asymptomatic with her 5-second postconversion pause.  She is currently being treated with molnupiravir.   Obviously goals of care discussion, palliative care should be continued to be addressed.  Appears fairly comfortable at this time.  Candee Furbish, MD

## 2022-02-09 NOTE — Assessment & Plan Note (Addendum)
Uncontrolled hyperglycemia.   Fasting glucose this am is 145.  Continue pre- meal and basal insulin.  Continue statin therapy.  Patient is tolerating po well

## 2022-02-09 NOTE — H&P (Signed)
History and Physical    Patient: Elizabeth Melton DOB: [birth year] DOA: 02/09/2022 DOS: the patient was seen and examined on 02/09/2022 PCP: Kathyrn Lass, MD  Patient coming from: Home - lives with her daughter; NOK: Daughter, Neomia Dear, [phone]   Chief Complaint: Rapid heart rate  HPI: Elizabeth Melton is a 81 y.o. female with medical history significant of chronic diastolic CHF; DM; afib; HTN; HLD; hypothyroidism; and pulmonary fibrosis presenting with rapid heart rate. She was last admitted from 2/15-18 for acute on chronic respiratory failure with hypoxia associated with ILD/COPD flare.  She reports that she was doing ok since her last hospital d/c.  She has been on 4-6L of home O2 but had turned it up to 8L.  She was feeling her usual self when her home health nurse came to check on her today.   The nurse checked her pulse ox and it was very low despite more O2 than usual.  She went downstairs and this apparently improved but they called 911 anyway.  EMS arrived and determined that she was in afib with RVR with HR >200 and transported her.  This had resolved by the time she arrived.  She had one other fleeting episode of afib in the ER followed by a sinus pause and then conversion.  Throughout the patient has been asymptomatic compared to her baseline.  She does have chronic SOB and cough and this is unchanged.    ER Course:  Rapid HR - afib with RVR, spontaneously converted prior to arrival.  Had another transient episode and 4 second sinus pause.  Mild AKI.  Cardiology will consult.     Review of Systems: As mentioned in the history of present illness. All other systems reviewed and are negative. Past Medical History:  Diagnosis Date   Acute on chronic diastolic (congestive) heart failure ([place]) 05/19/2021   Acute respiratory failure with hypoxia (HCC) 09/06/2020   Atrial fibrillation with RVR (HCC)    Diabetes mellitus without complication (HCC)     Hyperlipidemia    Hypertension    Hypothyroidism    IPF (idiopathic pulmonary fibrosis) ([place])    Pneumonia due to COVID-19 virus 01/16/2021   Thrombocytopenia [facility])    Past Surgical History:  Procedure Laterality Date   RIGHT HEART CATH N/A 07/05/2021   Procedure: RIGHT HEART CATH;  Surgeon: [name], MD;  Location: [place] [facility];  Service: Cardiovascular;  Laterality: N/A;   RIGHT HEART CATH N/A 01/06/2022   Procedure: RIGHT HEART CATH;  Surgeon: Larey Dresser, MD;  Location: [place] [facility];  Service: Cardiovascular;  Laterality: N/A;   RIGHT/LEFT HEART CATH AND CORONARY ANGIOGRAPHY N/A 11/23/2021   Procedure: RIGHT/LEFT HEART CATH AND CORONARY ANGIOGRAPHY;  Surgeon: Larey Dresser, MD;  Location: [place] [facility];  Service: Cardiovascular;  Laterality: N/A;   TUBAL LIGATION     Social History:  reports that she has been smoking cigarettes. She started smoking about 63 years ago. She has a 60.00 pack-year smoking history. She quit smokeless tobacco use about 59 years ago. She reports current alcohol use. She reports that she does not use drugs.  Allergies  Allergen Reactions   Lisinopril Other (See Comments)    Other reaction(s): cough 09/20/17    Family History  Problem Relation Age of Onset   Diabetes Mother    Kidney disease Mother    Hypertension Mother    Other Father        tuberculosis  Hypertension Sister    Hyperlipidemia Sister     Prior to Admission medications   Medication Sig Start Date End Date Taking? Authorizing Provider  acetaminophen (TYLENOL) 500 MG tablet Take 1,000 mg by mouth every 8 (eight) hours as needed for headache or mild pain (pain).    [provider]  albuterol (PROVENTIL) (2.5 MG/3ML) 0.083% nebulizer solution Take 3 mLs (2.5 mg total) by nebulization every 4 (four) hours as needed for wheezing or shortness of breath. 01/13/22   Domenic Polite, MD  albuterol (VENTOLIN HFA) 108 (90 Base) MCG/ACT inhaler Inhale 2  puffs into the lungs every 6 (six) hours as needed for wheezing or shortness of breath. 02/08/22   Brand Males, MD  apixaban (ELIQUIS) 5 MG TABS tablet Take 1 tablet (5 mg total) by mouth 2 (two) times daily. 11/24/21   Larey Dresser, MD  b complex vitamins tablet Take 1 tablet by mouth daily.    [provider]  bisacodyl (DULCOLAX) 10 MG suppository Place 10 mg rectally daily as needed for moderate constipation.    [provider]  Blood Glucose Monitoring Suppl (TRUE METRIX METER) w/Device KIT 1 each by Other route in the morning and at bedtime. 12/25/20   [provider]  Budeson-Glycopyrrol-Formoterol (BREZTRI AEROSPHERE) 160-9-4.8 MCG/ACT AERO Inhale 2 puffs into the lungs in the morning and at bedtime. 01/25/22   Cobb, Karie Schwalbe, NP  Budeson-Glycopyrrol-Formoterol (BREZTRI AEROSPHERE) 160-9-4.8 MCG/ACT AERO Inhale 2 puffs into the lungs in the morning and at bedtime. 01/25/22   Cobb, Karie Schwalbe, NP  buPROPion (WELLBUTRIN XL) 150 MG 24 hr tablet Take 1 tablet (150 mg total) by mouth daily. 12/20/21   Larey Dresser, MD  Calcium Carb-Cholecalciferol (CALCIUM + D3) 600-200 MG-UNIT TABS Take 2 tablets by mouth daily.    [provider]  diphenhydrAMINE (BENADRYL) 25 MG tablet Take 25 mg by mouth every 6 (six) hours as needed for allergies.    [provider]  DULoxetine (CYMBALTA) 30 MG capsule Take 1 capsule (30 mg total) by mouth daily. 01/14/22   Domenic Polite, MD  glucose blood test strip 1 each by Other route as needed for other (blood sugar).    [provider]  guaiFENesin (MUCINEX) 600 MG 12 hr tablet Take 1 tablet (600 mg total) by mouth 2 (two) times daily. Patient taking differently: Take 600 mg by mouth 2 (two) times daily. As needed 01/13/22   Domenic Polite, MD  insulin glargine (LANTUS) 100 UNIT/ML injection Inject 10 Units into the skin daily.    [provider]  Insulin Pen Needle (PEN NEEDLES 29GX1/2") 29G X  12MM MISC For insulin injection 05/22/21   [name], MD  levothyroxine (SYNTHROID, LEVOTHROID) 75 MCG tablet Take 75 mcg by mouth daily before breakfast.     [provider]  lidocaine (LIDODERM) 5 % Place 1 patch onto the skin at bedtime. Remove & Discard patch within 12 hours or as directed by MD 01/13/22   Domenic Polite, MD  lovastatin (MEVACOR) 40 MG tablet Take 40 mg by mouth at bedtime.    [provider]  methocarbamol 1000 MG TABS Take 1,000 mg by mouth every 8 (eight) hours as needed for muscle spasms. 01/13/22   Domenic Polite, MD  metoprolol succinate (TOPROL-XL) 25 MG 24 hr tablet Take 0.5 tablets (12.5 mg total) by mouth daily. 01/28/21   [name], MD  nicotine (NICODERM CQ - DOSED IN MG/24 HOURS) 14 mg/24hr patch Place 14 mg onto the  skin daily.    [provider]  Omega-3 Fatty Acids (FISH OIL) 1000 MG CAPS Take 1,000 mg by mouth daily.    [provider]  OXYGEN Inhale 4 L into the lungs continuous.    [provider]  pantoprazole (PROTONIX) 40 MG tablet Take 1 tablet (40 mg total) by mouth daily. 01/14/22   Domenic Polite, MD  Potassium Chloride (KLOR-CON PO) Take 20 mEq by mouth daily.    [provider]  predniSONE (STERAPRED UNI-PAK 21 TAB) 10 MG (21) TBPK tablet Start 60 mg po daily, taper 10 mg daily until finish 01/28/22   Max Sane, MD  senna-docusate (SENOKOT-S) 8.6-50 MG tablet Take 2 tablets by mouth at bedtime. Patient taking differently: Take 2 tablets by mouth at bedtime. As needed 01/13/22   Domenic Polite, MD  SMART SENSE THIN LANCETS 26G MISC 1 each by Does not apply route 2 (two) times daily.    [provider]  torsemide (DEMADEX) 20 MG tablet Patient takes 2 tablets once daily.    [provider]  traMADol (ULTRAM) 50 MG tablet Take 1 tablet (50 mg total) by mouth every 6 (six) hours as needed for moderate pain. 01/13/22   Domenic Polite, MD  Treprostinil (TYVASO DPI TITRATION KIT) 16 & 32 &  48 MCG POWD Inhale into the lungs. Inhale 1 breath 4 times per day    [provider]  VITAMIN D PO Take 1,000 Units by mouth daily.    [provider]    Physical Exam: Vitals:   02/09/22 1500 02/09/22 1550 02/09/22 1620 02/09/22 1700  BP: (!) 112/51 132/66 (!) 123/59 (!) 148/65  Pulse: 90 83 76 78  Resp: _0 (!) 22  Temp:      TempSrc:      SpO2: 96% 98% 99% 99%  Weight:      Height:       General:  Appears calm and comfortable and is in NAD, on 8L [employer] O2 Eyes:  PERRL, EOMI, normal lids, iris ENT:  grossly normal hearing, lips & tongue, mmm Neck:  no LAD, masses or thyromegaly Cardiovascular:  RRR, no m/r/g. No LE edema.  Respiratory:   Scattered diffuse rhonchi.  Normal respiratory effort. Abdomen:  soft, NT, ND Skin:  no rash or induration seen on limited exam Musculoskeletal:  grossly normal tone BUE/BLE, good ROM, no bony abnormality Psychiatric:  grossly normal mood and affect, speech fluent and appropriate, AOx3 Neurologic:  CN 2-12 grossly intact, moves all extremities in coordinated fashion   Radiological Exams on Admission: Independently reviewed - see discussion in A/P where applicable  DG Chest Port 1 View  Result Date: 02/09/2022 CLINICAL DATA:  Cardiac arrhythmia. EXAM: PORTABLE CHEST 1 VIEW COMPARISON:  January 25, 2022. FINDINGS: Stable cardiomegaly. Stable interstitial densities are noted throughout both lungs concerning for fibrosis or chronic scarring. Bony thorax is unremarkable. IMPRESSION: Stable cardiomegaly. Stable bilateral interstitial densities are noted concerning for fibrosis or chronic scarring. Electronically Signed   By: Marijo Conception M.D.   On: 02/09/2022 15:55    EKG: Independently reviewed.  NSR with rate 91; nonspecific ST changes with  NSCSLT   Labs on Admission: I have personally reviewed the available labs and imaging studies at the time of the admission.  Pertinent labs:    Glucose 126 BUN 55/Creatinine 2.80  on ISTAT WBC 14.1 Hgb 15.8    Assessment and Plan: * PAF (paroxysmal atrial fibrillation) ([place]) -Patient with known afib with 2  transient episodes of RVR today -Will observe for now on telemetry -Currently in NSR and rate controlled -Cardiology was consulted by the EDP -With her severe underlying pulmonary disease, this may become an increasingly frequent occurrence -Continue home Toprol XL -Will add prn IV Lopressor -Continue Eliquis  COVID-19 virus infection -Patient with prior repeated negative testing, but positive today -No apparent symptoms other than her chronic pulmonary issues -Stable O2 requirement -COVID POSITIVE -The patient has comorbidities which may increase the risk for ARDS/MODS -Will order molnupiravir  ILD (interstitial lung disease) ([place]) -She has advanced disease with a very poor prognosis -She understands this and yet also requests full code -Palliative care follows her as an outpatient and will request consult here -Continue home meds - Albuterol, Breztri, Mucinex, and Tyvaso (family asked to bring that in)   Mood disorder ([place]) -Continue Wellbutrin, Cymbalta  Tobacco abuse -Encourage cessation.     -Patch ordered   Essential (primary) hypertension -Continue Toprol XL  Type 2 diabetes mellitus with hyperglycemia (HCC) -Recent A1c was 7.6, indicating suboptimal control -Continue glargine -Cover with moderate-scale SSI  Hyperlipidemia/PAD -Continue Mevacor (pravastatin per formulary)  Hypothyroidism -Continue Synthroid    Advance Care Planning:   Code Status: Full Code   Consults: Palliative care; Cardiology  DVT Prophylaxis: Eliquis  Family Communication: None present; she is capable of communicating with family at this time  Severity of Illness: The appropriate patient status for this patient is OBSERVATION. Observation status is judged to be reasonable and necessary in order to provide the required intensity of service to ensure  the patient's safety. The patient's presenting symptoms, physical exam findings, and initial radiographic and laboratory data in the context of their medical condition is felt to place them at decreased risk for further clinical deterioration. Furthermore, it is anticipated that the patient will be medically stable for discharge from the hospital within 2 midnights of admission.   Author: Karmen Bongo, MD 02/09/2022 5:26 PM  For on call review [email or web address].

## 2022-02-09 NOTE — Assessment & Plan Note (Addendum)
Rate controlled with HR in the 60  Continue rate control with amiodarone  Close monitoring of pulmonary function.   Anticoagulation with apixaban.  Metoprolol has been discontinued due to bradycardia.  Continue telemetry monitoring.

## 2022-02-09 NOTE — Assessment & Plan Note (Signed)
-  Continue Mevacor (pravastatin per formulary) ?

## 2022-02-09 NOTE — Assessment & Plan Note (Addendum)
Continue with nicotine patch. ? ?

## 2022-02-09 NOTE — Assessment & Plan Note (Addendum)
Acute on chronic hypoxemic respiratory failure. Oxygenation is 96% on 6 L /min per HFNC.  Bronchodilator therapy and airway clearing techniques.  Patient on revenfenacin

## 2022-02-09 NOTE — Progress Notes (Signed)
[facility]) Hospital Liaison note: ? ?This patient is currently enrolled in [facility] outpatient-based Palliative Care. Will continue to follow for disposition. ? ?Please call with any outpatient palliative questions or concerns. ? ?Thank you, ?Lorelee Market, LPN ?[facility] Hospital Liaison ?[phone] ?

## 2022-02-09 NOTE — ED Provider Notes (Signed)
[facility] EMERGENCY DEPARTMENT Provider Note   CSN: [id number] Arrival date & time: 02/09/22  1359     History  Chief Complaint  Patient presents with   Atrial Fibrillation    Elizabeth Melton is a 81 y.o. female.  HPI Presents by EMS for evaluation of rapid heartbeat.  Apparently heart rate was 230, and spontaneously converted shortly after EMS applied the cardiac monitor.  Patient states that her home health nurse called EMS after they found her having a fast heartbeat.  Patient states she was unaware of this and did not notice any problems such as chest pain, shortness of breath, weakness or dizziness.  States she took her usual medications this morning.    Home Medications Prior to Admission medications   Medication Sig Start Date End Date Taking? Authorizing Provider  acetaminophen (TYLENOL) 500 MG tablet Take 1,000 mg by mouth every 8 (eight) hours as needed for headache or mild pain (pain).    [provider]  albuterol (PROVENTIL) (2.5 MG/3ML) 0.083% nebulizer solution Take 3 mLs (2.5 mg total) by nebulization every 4 (four) hours as needed for wheezing or shortness of breath. 01/13/22   Domenic Polite, MD  albuterol (VENTOLIN HFA) 108 (90 Base) MCG/ACT inhaler Inhale 2 puffs into the lungs every 6 (six) hours as needed for wheezing or shortness of breath. 02/08/22   Brand Males, MD  apixaban (ELIQUIS) 5 MG TABS tablet Take 1 tablet (5 mg total) by mouth 2 (two) times daily. 11/24/21   Larey Dresser, MD  b complex vitamins tablet Take 1 tablet by mouth daily.    [provider]  bisacodyl (DULCOLAX) 10 MG suppository Place 10 mg rectally daily as needed for moderate constipation.    [provider]  Blood Glucose Monitoring Suppl (TRUE METRIX METER) w/Device KIT 1 each by Other route in the morning and at bedtime. 12/25/20   [provider]  Budeson-Glycopyrrol-Formoterol (BREZTRI AEROSPHERE) 160-9-4.8 MCG/ACT AERO  Inhale 2 puffs into the lungs in the morning and at bedtime. 01/25/22   Cobb, Karie Schwalbe, NP  Budeson-Glycopyrrol-Formoterol (BREZTRI AEROSPHERE) 160-9-4.8 MCG/ACT AERO Inhale 2 puffs into the lungs in the morning and at bedtime. 01/25/22   Cobb, Karie Schwalbe, NP  buPROPion (WELLBUTRIN XL) 150 MG 24 hr tablet Take 1 tablet (150 mg total) by mouth daily. 12/20/21   Larey Dresser, MD  Calcium Carb-Cholecalciferol (CALCIUM + D3) 600-200 MG-UNIT TABS Take 2 tablets by mouth daily.    [provider]  diphenhydrAMINE (BENADRYL) 25 MG tablet Take 25 mg by mouth every 6 (six) hours as needed for allergies.    [provider]  DULoxetine (CYMBALTA) 30 MG capsule Take 1 capsule (30 mg total) by mouth daily. 01/14/22   Domenic Polite, MD  glucose blood test strip 1 each by Other route as needed for other (blood sugar).    [provider]  guaiFENesin (MUCINEX) 600 MG 12 hr tablet Take 1 tablet (600 mg total) by mouth 2 (two) times daily. Patient taking differently: Take 600 mg by mouth 2 (two) times daily. As needed 01/13/22   Domenic Polite, MD  insulin glargine (LANTUS) 100 UNIT/ML injection Inject 10 Units into the skin daily.    [provider]  Insulin Pen Needle (PEN NEEDLES 29GX1/2") 29G X 12MM MISC For insulin injection 05/22/21   [name], MD  levothyroxine (SYNTHROID, LEVOTHROID) 75 MCG tablet Take 75 mcg by mouth daily before breakfast.     [provider]  lidocaine (LIDODERM) 5 % Place 1 patch onto the skin at bedtime. Remove & Discard patch within 12 hours or as directed by MD 01/13/22   Domenic Polite, MD  lovastatin (MEVACOR) 40 MG tablet Take 40 mg by mouth at bedtime.    [provider]  methocarbamol 1000 MG TABS Take 1,000 mg by mouth every 8 (eight) hours as needed for muscle spasms. 01/13/22   Domenic Polite, MD  metoprolol succinate (TOPROL-XL) 25 MG 24 hr tablet Take 0.5 tablets (12.5 mg total) by mouth daily. 01/28/21   [name], MD   nicotine (NICODERM CQ - DOSED IN MG/24 HOURS) 14 mg/24hr patch Place 14 mg onto the skin daily.    [provider]  Omega-3 Fatty Acids (FISH OIL) 1000 MG CAPS Take 1,000 mg by mouth daily.    [provider]  OXYGEN Inhale 4 L into the lungs continuous.    [provider]  pantoprazole (PROTONIX) 40 MG tablet Take 1 tablet (40 mg total) by mouth daily. 01/14/22   Domenic Polite, MD  Potassium Chloride (KLOR-CON PO) Take 20 mEq by mouth daily.    [provider]  predniSONE (STERAPRED UNI-PAK 21 TAB) 10 MG (21) TBPK tablet Start 60 mg po daily, taper 10 mg daily until finish 01/28/22   Max Sane, MD  senna-docusate (SENOKOT-S) 8.6-50 MG tablet Take 2 tablets by mouth at bedtime. Patient taking differently: Take 2 tablets by mouth at bedtime. As needed 01/13/22   Domenic Polite, MD  SMART SENSE THIN LANCETS 26G MISC 1 each by Does not apply route 2 (two) times daily.    [provider]  torsemide (DEMADEX) 20 MG tablet Patient takes 2 tablets once daily.    [provider]  traMADol (ULTRAM) 50 MG tablet Take 1 tablet (50 mg total) by mouth every 6 (six) hours as needed for moderate pain. 01/13/22   Domenic Polite, MD  Treprostinil (TYVASO DPI TITRATION KIT) 16 & 32 & 48 MCG POWD Inhale into the lungs. Inhale 1 breath 4 times per day    [provider]  VITAMIN D PO Take 1,000 Units by mouth daily.    [provider]      Allergies    Lisinopril    Review of Systems   Review of Systems  Physical Exam Updated Vital Signs BP 132/66    Pulse 83    Temp 97.6 F (36.4 C) (Oral)    Resp 17    Ht _0  (1.676 m)    Wt 70.3 kg    SpO2 98%    BMI 25.02 kg/m  Physical Exam Vitals and nursing note reviewed.  Constitutional:      Appearance: She is well-developed. She is not ill-appearing.  HENT:     Head: Normocephalic and atraumatic.     Right Ear: External ear normal.     Left Ear: External ear normal.     Nose: No  congestion or rhinorrhea.  Eyes:     Conjunctiva/sclera: Conjunctivae normal.     Pupils: Pupils are equal, round, and reactive to light.  Neck:     Trachea: Phonation normal.  Cardiovascular:     Rate and Rhythm: Normal rate and regular rhythm.     Heart sounds: Normal heart sounds.  Pulmonary:     Effort: Pulmonary effort is normal. No respiratory distress.     Breath sounds: Normal breath sounds. No stridor.     Comments: She is wearing nasal cannula oxygen has a  normal oxygen saturation Abdominal:     General: There is no distension.     Palpations: Abdomen is soft.     Tenderness: There is no abdominal tenderness.  Musculoskeletal:        General: Normal range of motion.     Cervical back: Normal range of motion and neck supple.     Right lower leg: No edema.     Left lower leg: No edema.  Skin:    General: Skin is warm and dry.  Neurological:     Mental Status: She is alert and oriented to person, place, and time.     Cranial Nerves: No cranial nerve deficit.     Sensory: No sensory deficit.     Motor: No abnormal muscle tone.     Coordination: Coordination normal.  Psychiatric:        Mood and Affect: Mood normal.        Behavior: Behavior normal.    ED Results / Procedures / Treatments   Labs (all labs ordered are listed, but only abnormal results are displayed) Labs Reviewed  CBC WITH DIFFERENTIAL/PLATELET - Abnormal; Notable for the following components:      Result Value   WBC 14.1 (*)    Hemoglobin 15.8 (*)    HCT 47.2 (*)    RDW 17.9 (*)    nRBC 0.6 (*)    Neutro Abs 11.0 (*)    Abs Immature Granulocytes 0.08 (*)    All other components within normal limits  I-STAT CHEM 8, ED - Abnormal; Notable for the following components:   BUN 55 (*)    Creatinine, Ser 2.80 (*)    Glucose, Bld 126 (*)    Calcium, Ion 1.43 (*)    TCO2 21 (*)    Hemoglobin 10.5 (*)    HCT 31.0 (*)    All other components within normal limits  RESP PANEL BY RT-PCR (FLU A&B,  COVID) ARPGX2  MAGNESIUM    EKG EKG Interpretation  Date/Time:  Thursday February 09 2022 14:14:05 EST Ventricular Rate:  91 PR Interval:  91 QRS Duration: 113 QT Interval:  338 QTC Calculation: 416 R Axis:   99 Text Interpretation: Sinus rhythm Short PR interval Incomplete right bundle branch block Repol abnrm suggests ischemia, diffuse leads since last tracing no significant change Confirmed by Daleen Bo [phone]) on 02/09/2022 2:26:46 PM  Radiology DG Chest Port 1 View  Result Date: 02/09/2022 CLINICAL DATA:  Cardiac arrhythmia. EXAM: PORTABLE CHEST 1 VIEW COMPARISON:  January 25, 2022. FINDINGS: Stable cardiomegaly. Stable interstitial densities are noted throughout both lungs concerning for fibrosis or chronic scarring. Bony thorax is unremarkable. IMPRESSION: Stable cardiomegaly. Stable bilateral interstitial densities are noted concerning for fibrosis or chronic scarring. Electronically Signed   By: Marijo Conception M.D.   On: 02/09/2022 15:55    Procedures Procedures    Medications Ordered in ED Medications  sodium chloride 0.9 % bolus 500 mL (has no administration in time range)  0.9 %  sodium chloride infusion (has no administration in time range)    ED Course/ Medical Decision Making/ A&P Clinical Course as of 02/09/22 1619  Thu Feb 09, 2022  1458 Patient had sudden onset of rapid atrial fibrillation, followed by a 4-second pause, and resumption of normal sinus rhythm. [EW]  1504 We will place pads for external pacing, if that becomes required [EW]  1616 I updated the patient's daughter Elizabeth Melton on the situation.  She is understandable and agreeable. [EW]  Clinical Course User Index [EW] Daleen Bo, MD                           Medical Decision Making Patient presenting after found to have a rapid heart rate.  She had spontaneous conversion to normal sinus rhythm.  She did not have any associated symptoms.  She has interstitial lung disease, and her MOST form  indicates; full scope of treatment.  Amount and/or Complexity of Data Reviewed Independent Historian:     Details: The patient is a cogent historian. External Data Reviewed: ECG.    Details: Twelve-lead EKG indicates rapid atrial fibrillation, rate greater than 150.  Patient had recent hospitalization January 2023.  During that visit she was evaluated by cardiology with right heart catheterization that showed severe pulmonary artery hypertension.  She was also seen by pulmonary for management of chronic multifactorial interstitial lung disease.  She has managed medically for these problems Labs: ordered.    Details: CBC, metabolic panel, viral panel-normal except white count high, hemoglobin high, BUN high, creatinine high, glucose 70, calcium low, total CO2 slightly low Radiology: ordered and independent interpretation performed.    Details: Chest x-ray-no infiltrate or CHF, no pneumothorax ECG/medicine tests: ordered and independent interpretation performed.    Details: Cardiac monitor here initially indicated normal sinus rhythm.  At 1453, she had a short period of asystole, about 4 seconds.  This was preceded by a period of atrial fibrillation with rapid response and followed by normal sinus rhythm. Discussion of management or test interpretation with external provider(s): Consultation cardiology-they will see patient as consultant, recommend hospitalist admission  Consultation hospitalist to admit patient, for observation and treatment  Risk Prescription drug management. Decision regarding hospitalization. Risk Details: Patient presenting for rapid heartbeat, which spontaneously resolved.  During observation she had a 4-second pause, following short period of A-fib RVR.  She has been primarily in normal sinus rhythm.  She has chronic interstitial lung disease, is on palliative status relative to that but has a MOST form indicating full scope of treatment.  She recently had right heart cath.   She does not have known similar arrhythmias.  IV fluid ordered for AKI.  She requires hospitalization including cardiology consultation and monitoring to watch for progression or worsening of her cardiac symptoms.  Critical Care Total time providing critical care: 30-74 minutes               Final Clinical Impression(s) / ED Diagnoses Final diagnoses:  Atrial fibrillation with RVR ([facility])  Sinus pause  AKI (acute kidney injury) [facility])    Rx / DC Orders ED Discharge Orders     None         Daleen Bo, MD 02/09/22 1643

## 2022-02-09 NOTE — Assessment & Plan Note (Addendum)
Blood pressure has been stable.  Continue rate control atrial fibrillation with amiodarone.

## 2022-02-09 NOTE — Assessment & Plan Note (Addendum)
On levothyroxine  ?

## 2022-02-09 NOTE — Progress Notes (Signed)
? ? ?[facility] ?Community Palliative Care Consult Note ?Telephone: [phone]  ?Fax: [phone]  ? ? ?Date of encounter: 02/09/22 ?1:00 PM ?PATIENT NAME: [place] [address]   ?[phone] (home)  ?DOB: 12/16/1940 ?MRN: [id number] ?PRIMARY CARE PROVIDER:    ?Kathyrn Lass, MD,  ?[address] ?[place] [address] ?[phone] ? ?REFERRING PROVIDER:   ?Kathyrn Lass, MD ?[address] ?[place],  [place] [address] ?[phone] ? ?RESPONSIBLE PARTY:    ?Contact Information   ? ? Name Relation Home Work Mobile  ? Hammock,[name] Daughter [phone]  [phone]  ? ?  ? ? ? ?I met face to face with patient and family in their home. Palliative Care was asked to follow this patient by consultation request of  Kathyrn Lass, MD to address advance care planning and complex medical decision making. This is a follow up visit. ? ?                                 ASSESSMENT, SYMPTOM MANAGEMENT AND PLAN / RECOMMENDATIONS:  ?Afib RVR ?Noted asymptomatic afib with variable RVR between 140-170 (per apical count). ?Has not taken Metoprolol and Eliquis today yet. ?Notified EMS and remained with pt until EMS on scene and report given. ?ASA 325 mg chewable given. ?Educated pt and daughter that taking Metoprolol and Eliquis are critical for rate control and stroke prevention respectively. ? ?2.  ILD with severe pulmonary hypertension ?Increasing O2 demand with O2 increase to 6L/min and O2 sat 89-92%. ?Continue follow up with Pulmonologist. ?Continue Tyvaso, Breztri and Albuterol ? ?3.  Palliative Care Encounter ?Poor function and life limiting illness, has not wanted to discuss goals of care previously without discussion with children ?Currently full code, wanting to avoid hospitalization as much as possible. ?Hospital liaison notified of pending ED visit/possible admission. ? ? ? ?Advance Care Planning/Goals of Care: Goals include to maximize quality of life and  symptom management. ?  ?CODE STATUS: ?Full code at present ? ? ? ?Follow up Palliative Care Visit: Palliative care will continue to follow for complex medical decision making, advance care planning, and clarification of goals. Return 1-2 weeks or prn after hospital d/c. ? ? ?This visit was coded based on medical decision making (MDM). ? ?PPS: 50% ? ?HOSPICE ELIGIBILITY/DIAGNOSIS: TBD ? ?Chief Complaint:  ?[facility] is following up with patient for chronic disease management of ILD, advance directive and defining/refining goals of care. ? ? ?HISTORY OF PRESENT ILLNESS:  Elizabeth Melton is a 81 y.o. year old female with chronic respiratory failure secondary to  ILD and severe pulmonary hypertension with PASP in 80s.  She also has paroxysmal atrial fibrillation, diabetes, HTN, chronic diastolic CHF, and CKD.  On arrival she states she is having a good day, feels good.  Denies CP, lightheadedness, worsening SOB, nausea, vomiting, bright red or dark tarry stools. She reports recent need to increase to 6L/min on [place].  States a few days prior that she had called EMS with worsening dyspnea which they treated at home when pt indicated she did not want to go to ER. Had strip which showed AFIB with controlled rate.  On noting that heart rate severely elevated again asked pt and she denied CP, worsening SOB, nausea, lightheadedness. Advised pt of need to go to ER for rate control.  She states she has taken her Levothyroxine but has not  yet taken her Metoprolol or Eliquis.  She was unaware that she has hx of afib per self report. Educated that Metoprolol is given for rate control and Eliquis for help with stroke prevention.  Called EMS, advised of likely afib/RVR with rate 140-170 and need for pt transport to ER.  On arrival, EMS was advised of VS with O2 sat 92% and exhibited dyspnea is baseline for pt.  12 lead EKG on scene showed AFIB with RVR rate of 200.  Pt was left in care of EMS for  transport to the hospital.  Daughter, [name] was at home and advised of same.   ? ? ?History obtained from review of EMR, discussion with family, EMS staff and/or Ms. Mandt.  ?I reviewed available labs, medications, imaging, studies and related documents from the EMR.  Records reviewed and summarized above.  ? ?ROS ? ?General: NAD ?EYES: denies vision changes ?ENMT: denies dysphagia ?Cardiovascular: denies chest pain, denies worsening DOE ?Pulmonary: denies cough except occasional, denies increased SOB ?Abdomen: endorses good appetite, denies constipation, endorses continence of bowel ?GU: denies dysuria, endorses continence of urine ?MSK:  denies increased weakness, no falls reported ?Skin: denies rashes or wounds ?Neurological: denies pain, lightheadedness and insomnia ?Psych: Endorses positive mood ?Heme/lymph/immuno: denies bruises, abnormal bleeding ? ?Physical Exam: ?Constitutional: NAD ?General: thin/WNWD  ?EYES: anicteric sclera, lids intact, no discharge  ?ENMT: intact hearing, oral mucous membranes moist, dentition intact ?CV: IRIR with very rapid rate of 140-170 on apical assessment, no LE edema ?Pulmonary: CTAB in upper lobes, faint coarse BS in bilateral bases, mild tachypnea and increased work of breathing but no distress, no cough, room air ?Abdomen: normo-active BS + 4 quadrants, soft and non tender, no ascites ?GU: deferred ?MSK: moves all extremities, ambulatory with rollator walker ?Skin: warm and dry, no rashes or wounds on visible skin ?Neuro:  no generalized weakness,  no cognitive impairment ?Psych: non-anxious affect, A and O x 3 ?Hem/lymph/immuno: no widespread bruising ? ? ?Thank you for the opportunity to participate in the care of Ms. Tibbett.  The palliative care team will continue to follow. Please call our office at [phone] if we can be of additional assistance.  ? ?Marijo Conception, FNP =-C ? ?COVID-19 PATIENT SCREENING TOOL ?Asked and negative response unless otherwise noted:   ? ?Have you had symptoms of covid, tested positive or been in contact with someone with symptoms/positive test in the past 5-10 days?  No ?

## 2022-02-09 NOTE — ED Triage Notes (Addendum)
Pt BIB EMS from home due to afib rvr. Pt HR was 230 and is now 90 on arrival. Pt converted herself as soon as was in the ambulance on her own. Pt has a hx of afib. Pt is on palliative care for interstitial lung disease.  Pt is axox4. Pt not symptomatic. VSS.  ?

## 2022-02-09 NOTE — Assessment & Plan Note (Deleted)
-  Patient with prior repeated negative testing, but positive today ?-No apparent symptoms other than her chronic pulmonary issues ?-Stable O2 requirement ?-COVID POSITIVE ?-The patient has comorbidities which may increase the risk for ARDS/MODS ?-Will order molnupiravir ?

## 2022-02-10 DIAGNOSIS — J849 Interstitial pulmonary disease, unspecified: Secondary | ICD-10-CM

## 2022-02-10 DIAGNOSIS — E871 Hypo-osmolality and hyponatremia: Secondary | ICD-10-CM | POA: Diagnosis not present

## 2022-02-10 DIAGNOSIS — Z7189 Other specified counseling: Secondary | ICD-10-CM | POA: Diagnosis not present

## 2022-02-10 DIAGNOSIS — J438 Other emphysema: Secondary | ICD-10-CM | POA: Diagnosis present

## 2022-02-10 DIAGNOSIS — F39 Unspecified mood [affective] disorder: Secondary | ICD-10-CM | POA: Diagnosis present

## 2022-02-10 DIAGNOSIS — I2510 Atherosclerotic heart disease of native coronary artery without angina pectoris: Secondary | ICD-10-CM | POA: Diagnosis present

## 2022-02-10 DIAGNOSIS — J1282 Pneumonia due to coronavirus disease 2019: Secondary | ICD-10-CM | POA: Diagnosis present

## 2022-02-10 DIAGNOSIS — N63 Unspecified lump in unspecified breast: Secondary | ICD-10-CM | POA: Diagnosis present

## 2022-02-10 DIAGNOSIS — U071 COVID-19: Secondary | ICD-10-CM | POA: Diagnosis not present

## 2022-02-10 DIAGNOSIS — E1122 Type 2 diabetes mellitus with diabetic chronic kidney disease: Secondary | ICD-10-CM | POA: Diagnosis present

## 2022-02-10 DIAGNOSIS — R918 Other nonspecific abnormal finding of lung field: Secondary | ICD-10-CM | POA: Diagnosis not present

## 2022-02-10 DIAGNOSIS — G4733 Obstructive sleep apnea (adult) (pediatric): Secondary | ICD-10-CM | POA: Diagnosis not present

## 2022-02-10 DIAGNOSIS — N179 Acute kidney failure, unspecified: Secondary | ICD-10-CM | POA: Diagnosis present

## 2022-02-10 DIAGNOSIS — I455 Other specified heart block: Secondary | ICD-10-CM | POA: Diagnosis not present

## 2022-02-10 DIAGNOSIS — E785 Hyperlipidemia, unspecified: Secondary | ICD-10-CM | POA: Diagnosis present

## 2022-02-10 DIAGNOSIS — I130 Hypertensive heart and chronic kidney disease with heart failure and stage 1 through stage 4 chronic kidney disease, or unspecified chronic kidney disease: Secondary | ICD-10-CM | POA: Diagnosis present

## 2022-02-10 DIAGNOSIS — E1169 Type 2 diabetes mellitus with other specified complication: Secondary | ICD-10-CM | POA: Diagnosis not present

## 2022-02-10 DIAGNOSIS — I480 Paroxysmal atrial fibrillation: Secondary | ICD-10-CM | POA: Diagnosis present

## 2022-02-10 DIAGNOSIS — I10 Essential (primary) hypertension: Secondary | ICD-10-CM | POA: Diagnosis not present

## 2022-02-10 DIAGNOSIS — I2729 Other secondary pulmonary hypertension: Secondary | ICD-10-CM | POA: Diagnosis present

## 2022-02-10 DIAGNOSIS — J9621 Acute and chronic respiratory failure with hypoxia: Secondary | ICD-10-CM | POA: Diagnosis not present

## 2022-02-10 DIAGNOSIS — J441 Chronic obstructive pulmonary disease with (acute) exacerbation: Secondary | ICD-10-CM | POA: Diagnosis not present

## 2022-02-10 DIAGNOSIS — I4892 Unspecified atrial flutter: Secondary | ICD-10-CM | POA: Diagnosis present

## 2022-02-10 DIAGNOSIS — R911 Solitary pulmonary nodule: Secondary | ICD-10-CM | POA: Diagnosis not present

## 2022-02-10 DIAGNOSIS — I5032 Chronic diastolic (congestive) heart failure: Secondary | ICD-10-CM | POA: Diagnosis present

## 2022-02-10 DIAGNOSIS — I495 Sick sinus syndrome: Secondary | ICD-10-CM | POA: Diagnosis present

## 2022-02-10 DIAGNOSIS — I4891 Unspecified atrial fibrillation: Secondary | ICD-10-CM | POA: Diagnosis not present

## 2022-02-10 DIAGNOSIS — R0600 Dyspnea, unspecified: Secondary | ICD-10-CM | POA: Diagnosis not present

## 2022-02-10 DIAGNOSIS — E1165 Type 2 diabetes mellitus with hyperglycemia: Secondary | ICD-10-CM

## 2022-02-10 DIAGNOSIS — R590 Localized enlarged lymph nodes: Secondary | ICD-10-CM | POA: Diagnosis not present

## 2022-02-10 DIAGNOSIS — F17210 Nicotine dependence, cigarettes, uncomplicated: Secondary | ICD-10-CM | POA: Diagnosis present

## 2022-02-10 DIAGNOSIS — J84112 Idiopathic pulmonary fibrosis: Secondary | ICD-10-CM | POA: Diagnosis not present

## 2022-02-10 DIAGNOSIS — Z515 Encounter for palliative care: Secondary | ICD-10-CM | POA: Diagnosis not present

## 2022-02-10 DIAGNOSIS — Z794 Long term (current) use of insulin: Secondary | ICD-10-CM

## 2022-02-10 DIAGNOSIS — E039 Hypothyroidism, unspecified: Secondary | ICD-10-CM | POA: Diagnosis not present

## 2022-02-10 DIAGNOSIS — N1831 Chronic kidney disease, stage 3a: Secondary | ICD-10-CM | POA: Diagnosis present

## 2022-02-10 DIAGNOSIS — I272 Pulmonary hypertension, unspecified: Secondary | ICD-10-CM | POA: Diagnosis not present

## 2022-02-10 LAB — CBG MONITORING, ED
Glucose-Capillary: 134 mg/dL — ABNORMAL HIGH (ref 70–99)
Glucose-Capillary: 181 mg/dL — ABNORMAL HIGH (ref 70–99)
Glucose-Capillary: 206 mg/dL — ABNORMAL HIGH (ref 70–99)

## 2022-02-10 LAB — COMPREHENSIVE METABOLIC PANEL
ALT: 20 U/L (ref 0–44)
AST: 32 U/L (ref 15–41)
Albumin: 3.1 g/dL — ABNORMAL LOW (ref 3.5–5.0)
Alkaline Phosphatase: 62 U/L (ref 38–126)
Anion gap: 9 (ref 5–15)
BUN: 25 mg/dL — ABNORMAL HIGH (ref 8–23)
CO2: 27 mmol/L (ref 22–32)
Calcium: 8.8 mg/dL — ABNORMAL LOW (ref 8.9–10.3)
Chloride: 103 mmol/L (ref 98–111)
Creatinine, Ser: 1.18 mg/dL — ABNORMAL HIGH (ref 0.44–1.00)
GFR, Estimated: 47 mL/min — ABNORMAL LOW (ref >60)
Glucose, Bld: 190 mg/dL — ABNORMAL HIGH (ref 70–99)
Potassium: 4.4 mmol/L (ref 3.5–5.1)
Sodium: 139 mmol/L (ref 135–145)
Total Bilirubin: 1.2 mg/dL (ref 0.3–1.2)
Total Protein: 6.2 g/dL — ABNORMAL LOW (ref 6.5–8.1)

## 2022-02-10 LAB — BASIC METABOLIC PANEL
Anion gap: 8 (ref 5–15)
BUN: 27 mg/dL — ABNORMAL HIGH (ref 8–23)
CO2: 24 mmol/L (ref 22–32)
Calcium: 8.3 mg/dL — ABNORMAL LOW (ref 8.9–10.3)
Chloride: 105 mmol/L (ref 98–111)
Creatinine, Ser: 1.22 mg/dL — ABNORMAL HIGH (ref 0.44–1.00)
GFR, Estimated: 45 mL/min — ABNORMAL LOW (ref >60)
Glucose, Bld: 168 mg/dL — ABNORMAL HIGH (ref 70–99)
Potassium: 4.2 mmol/L (ref 3.5–5.1)
Sodium: 137 mmol/L (ref 135–145)

## 2022-02-10 LAB — C-REACTIVE PROTEIN: CRP: 0.9 mg/dL (ref <1.0)

## 2022-02-10 LAB — CBC
HCT: 43.5 % (ref 36.0–46.0)
Hemoglobin: 13.9 g/dL (ref 12.0–15.0)
MCH: 32.7 pg (ref 26.0–34.0)
MCHC: 32 g/dL (ref 30.0–36.0)
MCV: 102.4 fL — ABNORMAL HIGH (ref 80.0–100.0)
Platelets: 162 10*3/uL (ref 150–400)
RBC: 4.25 MIL/uL (ref 3.87–5.11)
RDW: 18 % — ABNORMAL HIGH (ref 11.5–15.5)
WBC: 10.2 10*3/uL (ref 4.0–10.5)
nRBC: 0.7 % — ABNORMAL HIGH (ref 0.0–0.2)

## 2022-02-10 LAB — FERRITIN: Ferritin: 428 ng/mL — ABNORMAL HIGH (ref 11–307)

## 2022-02-10 LAB — GLUCOSE, CAPILLARY: Glucose-Capillary: 170 mg/dL — ABNORMAL HIGH (ref 70–99)

## 2022-02-10 MED ORDER — METOPROLOL SUCCINATE ER 25 MG PO TB24
25.0000 mg | ORAL_TABLET | Freq: Every day | ORAL | Status: DC
  Administered 2022-02-10 – 2022-02-16 (×6): 25 mg via ORAL
  Filled 2022-02-10 (×7): qty 1

## 2022-02-10 MED ORDER — DILTIAZEM HCL-DEXTROSE 125-5 MG/125ML-% IV SOLN (PREMIX)
5.0000 mg/h | INTRAVENOUS | Status: DC
  Administered 2022-02-10: 5 mg/h via INTRAVENOUS
  Filled 2022-02-10: qty 125

## 2022-02-10 MED ORDER — FUROSEMIDE 10 MG/ML IJ SOLN
60.0000 mg | Freq: Two times a day (BID) | INTRAMUSCULAR | Status: AC
  Administered 2022-02-10 (×2): 60 mg via INTRAVENOUS
  Filled 2022-02-10 (×2): qty 6

## 2022-02-10 MED ORDER — AMIODARONE HCL IN DEXTROSE 360-4.14 MG/200ML-% IV SOLN
60.0000 mg/h | INTRAVENOUS | Status: DC
  Administered 2022-02-10 (×2): 60 mg/h via INTRAVENOUS
  Filled 2022-02-10: qty 200

## 2022-02-10 MED ORDER — AMIODARONE LOAD VIA INFUSION
150.0000 mg | Freq: Once | INTRAVENOUS | Status: AC
  Administered 2022-02-10: 150 mg via INTRAVENOUS
  Filled 2022-02-10: qty 83.34

## 2022-02-10 MED ORDER — AMIODARONE HCL IN DEXTROSE 360-4.14 MG/200ML-% IV SOLN
30.0000 mg/h | INTRAVENOUS | Status: DC
  Administered 2022-02-11 (×2): 30 mg/h via INTRAVENOUS
  Filled 2022-02-10 (×4): qty 200

## 2022-02-10 NOTE — ED Notes (Signed)
Pt SpO2 88% on 6L via [place]. Pt placed on 15L via NRB. SpO2 100% on NRB. MD made aware.  ?

## 2022-02-10 NOTE — Progress Notes (Signed)
PROGRESS NOTE    Elizabeth Melton  QIW:[id number] DOB: [birth year] DOA: 02/09/2022 PCP: Kathyrn Lass, MD  Narrative2 year old female with history of COPD, interstitial lung disease, chronic respiratory failure with hypoxia on 5-6 L oxygen via nasal cannula at home, severe pulmonary hypertension, chronic diastolic CHF, diabetes mellitus type 2, hypertension, paroxysmal A. fib, history of pulmonary embolism on Eliquis, PAD presented to the ED as her home health nurse noticed that her oxygenation was lower than usual.  Patient reports using 7 to 8 L of oxygen for the last several weeks, following her prolonged hospitalization from 1/18-2/3 she was admitted to [facility] from 2/16-2/18 treated for COPD exacerbation with steroids then. -In the ED she was noted to be in rapid A-fib, spontaneously converted, followed by posttermination pause of 5 seconds, seen by cardiology, metoprolol held   Subjective: -Feels okay, does not feel like her breathing is much worse than usual  Assessment & Plan:  Acute on chronic hypoxic respiratory failure Severe interstitial lung disease Acute on chronic diastolic CHF Severe pulmonary hypertension -Echo 11/22 noted EF of 60-65%, moderate TR, PASP of 85 mmHg -Seen by pulmonary and heart failure team during hospitalization in late January, right heart cath noted severe pulmonary hypertension, treated with steroids as well then -Appears close to euvolemic, however will attempt to diurese to keep overall net negative given extremely poor reservoir -Has SARS COVID-19 infection however does not appear symptomatic from this, chest x-ray is unchanged, notes chronic fibrotic changes consistent with known ILD -Will obtain CT chest when vitals are more stable -Very poor prognosis she was seen by palliative care in January and again in February, she understands poor prognosis however wants to remain full code with full scope of treatment for now  * PAF (paroxysmal atrial  fibrillation) ([place]) with RVR -Yesterday had posttermination pause of 5 seconds, metoprolol held -Now in rapid A-fib, diltiazem attempted, heart rate remained consistently elevated despite this -Cards following, plan for amiodarone as a temporizing measure -Continue Eliquis, restart Toprol  COVID-19 virus infection -No apparent symptoms other than her chronic pulmonary issues -The patient has comorbidities which may increase the risk for ARDS/MODS -Started on molnupiravir -Chest x-ray with chronic fibrosis, will obtain CT chest when vitals are more stable, check inflammatory markers   CKD 3a Creatinine stable, continue current dose of torsemide   COPD with chronic bronchitis and emphysema ([place]) Interstitial lung disease Chronic respiratory failure on 5 to 6 L O2 --Outpatient follow-up with pulmonary   Type 2 diabetes mellitus with hyperglycemia ([place])- a1c in 10/2021: 7.6 Continue lantus 10 units daily and moderate SSI   Essential (primary) hypertension- -Stable, meds as above   Hyperlipidemia/PAD-  Continue mevacor   Hypothyroidism-  Last TSH 05/2021 and wnl Continue home synthroid   OSA -CPAP QHS   ILD (interstitial lung disease) ([place]) -She has advanced disease with a very poor prognosis -Palliative care follows her as an outpatient  -Continue home meds - Albuterol, Breztri, Mucinex, and Tyvaso (family asked to bring that in)  Goals of care discussion: Patient has advanced ILD, chronic respiratory failure, COPD on 6 L home O2, recently has been using 7 to 8 L, also has severe PAH, cor pulmonale, diastolic CHF, CKD, A-fib RVR, her prognosis is poor, this has been discussed again with the patient at length today.  She was seen by palliative care in January and again in February , recommended consideration of DNR, she wishes to remain full code with full scope of treatment for now, she tells  me that she wishes for [place] to attempt resuscitation and life support so her family can  see her before she passes, when it gets to the point   DVT prophylaxis: Apixaban Code Status: Full code Family Communication: Discussed patient in detail, no family at bedside Disposition Plan: To be determined   consultants: Cardiology  Procedures:   Antimicrobials:    Objective: Vitals:   02/10/22 1330 02/10/22 1345 02/10/22 1400 02/10/22 1415  BP: 113/88 117/75 117/81 (!) 108/95  Pulse: (!) 59 (!) 151 (!) 132 (!) 137  Resp: 16 (!) 29 (!) 22 16  Temp:      TempSrc:      SpO2: 97% (!) 87% 95% 92%  Weight:      Height:        Intake/Output Summary (Last 24 hours) at 02/10/2022 1502 Last data filed at 02/10/2022 1202 Gross per 24 hour  Intake 8.67 ml  Output --  Net 8.67 ml   Filed Weights   02/09/22 1408 02/09/22 1409  Weight: 77.1 kg 70.3 kg    Examination:  General exam: Chronically ill female sitting up in bed, AAOx3 HEENT: Positive JVD CVS: S1-S2, irregularly irregular rhythm Lungs: Fine bilateral rales Abdomen: Soft, nontender, bowel sounds present Extremities: No edema  Skin: No rashes Psychiatry: Judgement and insight appear normal. Mood & affect appropriate.     Data Reviewed:   CBC: Recent Labs  Lab 02/09/22 1441 02/09/22 1546 02/10/22 0336  WBC 14.1*  --  10.2  NEUTROABS 11.0*  --   --   HGB 15.8* 10.5* 13.9  HCT 47.2* 31.0* 43.5  MCV 100.0  --  102.4*  PLT 153  --  208   Basic Metabolic Panel: Recent Labs  Lab 02/09/22 1522 02/09/22 1546 02/10/22 0527 02/10/22 0942  NA  --  140 137 139  K  --  4.3 4.2 4.4  CL  --  111 105 103  CO2  --   --  24 27  GLUCOSE  --  126* 168* 190*  BUN  --  55* 27* 25*  CREATININE  --  2.80* 1.22* 1.18*  CALCIUM  --   --  8.3* 8.8*  MG 2.1  --   --   --    GFR: Estimated Creatinine Clearance: 36.2 mL/min (A) (by C-G formula based on SCr of 1.18 mg/dL (H)). Liver Function Tests: Recent Labs  Lab 02/10/22 0942  AST 32  ALT 20  ALKPHOS 62  BILITOT 1.2  PROT 6.2*  ALBUMIN 3.1*   No results  for input(s): LIPASE, AMYLASE in the last 168 hours. No results for input(s): AMMONIA in the last 168 hours. Coagulation Profile: No results for input(s): INR, PROTIME in the last 168 hours. Cardiac Enzymes: No results for input(s): CKTOTAL, CKMB, CKMBINDEX, TROPONINI in the last 168 hours. BNP (last 3 results) No results for input(s): PROBNP in the last 8760 hours. HbA1C: No results for input(s): HGBA1C in the last 72 hours. CBG: Recent Labs  Lab 02/09/22 2219 02/10/22 0812 02/10/22 1159  GLUCAP 199* 134* 181*   Lipid Profile: No results for input(s): CHOL, HDL, LDLCALC, TRIG, CHOLHDL, LDLDIRECT in the last 72 hours. Thyroid Function Tests: No results for input(s): TSH, T4TOTAL, FREET4, T3FREE, THYROIDAB in the last 72 hours. Anemia Panel: Recent Labs    02/10/22 0942  FERRITIN 428*   Urine analysis:    Component Value Date/Time   COLORURINE YELLOW (A) 01/26/2022 0241   APPEARANCEUR CLEAR (A) 01/26/2022 0241   APPEARANCEUR  Clear 09/20/2021 1449   LABSPEC 1.008 01/26/2022 0241   PHURINE 5.0 01/26/2022 0241   GLUCOSEU NEGATIVE 01/26/2022 0241   HGBUR NEGATIVE 01/26/2022 0241   BILIRUBINUR NEGATIVE 01/26/2022 0241   BILIRUBINUR Negative 09/20/2021 [place] 01/26/2022 0241   PROTEINUR NEGATIVE 01/26/2022 0241   NITRITE NEGATIVE 01/26/2022 0241   LEUKOCYTESUR NEGATIVE 01/26/2022 0241   Sepsis Labs: _0 (procalcitonin:4,lacticidven:4)  ) Recent Results (from the past 240 hour(s))  Resp Panel by RT-PCR (Flu A&B, Covid) Nasopharyngeal Swab     Status: Abnormal   Collection Time: 02/09/22  2:59 PM   Specimen: Nasopharyngeal Swab; Nasopharyngeal(NP) swabs in vial transport medium  Result Value Ref Range Status   SARS Coronavirus 2 by RT PCR POSITIVE (A) NEGATIVE Final    Comment: (NOTE) SARS-CoV-2 target nucleic acids are DETECTED.  The SARS-CoV-2 RNA is generally detectable in upper respiratory specimens during the acute phase of infection.  Positive results are indicative of the presence of the identified virus, but do not rule out bacterial infection or co-infection with other pathogens not detected by the test. Clinical correlation with patient history and other diagnostic information is necessary to determine patient infection status. The expected result is Negative.  Fact Sheet for Patients: EntrepreneurPulse.com.au  Fact Sheet for Healthcare Providers: IncredibleEmployment.be  This test is not yet approved or cleared by the [place] FDA and  has been authorized for detection and/or diagnosis of SARS-CoV-2 by FDA under an Emergency Use Authorization (EUA).  This EUA will remain in effect (meaning this test can be used) for the duration of  the COVID-19 declaration under Section 564(b)(1) of the A ct, 21 U.S.C. section 360bbb-3(b)(1), unless the authorization is terminated or revoked sooner.     Influenza A by PCR NEGATIVE NEGATIVE Final   Influenza B by PCR NEGATIVE NEGATIVE Final    Comment: (NOTE) The Xpert Xpress SARS-CoV-2/FLU/RSV plus assay is intended as an aid in the diagnosis of influenza from Nasopharyngeal swab specimens and should not be used as a sole basis for treatment. Nasal washings and aspirates are unacceptable for Xpert Xpress SARS-CoV-2/FLU/RSV testing.  Fact Sheet for Patients: EntrepreneurPulse.com.au  Fact Sheet for Healthcare Providers: IncredibleEmployment.be  This test is not yet approved or cleared by the [place] FDA and has been authorized for detection and/or diagnosis of SARS-CoV-2 by FDA under an Emergency Use Authorization (EUA). This EUA will remain in effect (meaning this test can be used) for the duration of the COVID-19 declaration under Section 564(b)(1) of the Act, 21 U.S.C. section 360bbb-3(b)(1), unless the authorization is terminated or revoked.  Performed at [facility],  [place] [address], [place], [place] [address]      Radiology Studies: DG Chest Port 1 View  Result Date: 02/09/2022 CLINICAL DATA:  Cardiac arrhythmia. EXAM: PORTABLE CHEST 1 VIEW COMPARISON:  January 25, 2022. FINDINGS: Stable cardiomegaly. Stable interstitial densities are noted throughout both lungs concerning for fibrosis or chronic scarring. Bony thorax is unremarkable. IMPRESSION: Stable cardiomegaly. Stable bilateral interstitial densities are noted concerning for fibrosis or chronic scarring. Electronically Signed   By: Marijo Conception M.D.   On: 02/09/2022 15:55     Scheduled Meds:  apixaban  5 mg Oral BID   vitamin C  500 mg Oral Daily   buPROPion  150 mg Oral Daily   DULoxetine  30 mg Oral Daily   fluticasone furoate-vilanterol  1 puff Inhalation Daily   furosemide  60 mg Intravenous Q12H   guaiFENesin  600 mg  Oral BID   insulin aspart  0-15 Units Subcutaneous TID WC   insulin aspart  0-5 Units Subcutaneous QHS   insulin glargine-yfgn  10 Units Subcutaneous Daily   levothyroxine  75 mcg Oral QAC breakfast   molnupiravir EUA  4 capsule Oral BID   nicotine  14 mg Transdermal Daily   pantoprazole  40 mg Oral Daily   pravastatin  40 mg Oral q1800   sodium chloride flush  3 mL Intravenous Q12H   sodium chloride flush  3 mL Intravenous Q12H   umeclidinium bromide  1 puff Inhalation Daily   zinc sulfate  220 mg Oral Daily   Continuous Infusions:  amiodarone 60 mg/hr (02/10/22 1219)   Followed by   amiodarone     diltiazem (CARDIZEM) infusion Stopped (02/10/22 1216)     LOS: 0 days    Time spent: 40mn  PDomenic Polite MD [facility]   02/10/2022, 3:02 PM

## 2022-02-10 NOTE — ED Notes (Addendum)
Pt denies CP at this time. Respirations even and labored. Pt is currently on 15L via [place].   ?

## 2022-02-10 NOTE — Progress Notes (Signed)
?  02/10/22 1825  ?Assess: MEWS Score  ?Temp 97.8 ?F (36.6 ?C)  ?BP 111/69  ?Pulse Rate (!) 139  ?Resp 20  ?SpO2 90 %  ?O2 Device HFNC  ?Assess: MEWS Score  ?MEWS Temp 0  ?MEWS Systolic 0  ?MEWS Pulse 3  ?MEWS RR 0  ?MEWS LOC 0  ?MEWS Score 3  ?MEWS Score Color Yellow  ?Assess: if the MEWS score is Yellow or Red  ?Were vital signs taken at a resting state? Yes  ?Focused Assessment No change from prior assessment  ?Early Detection of Sepsis Score *See Row Information* High  ?MEWS guidelines implemented *See Row Information* Yes  ?Treat  ?MEWS Interventions Administered scheduled meds/treatments  ?Take Vital Signs  ?Increase Vital Sign Frequency  Yellow: Q 2hr X 2 then Q 4hr X 2, if remains yellow, continue Q 4hrs  ?Escalate  ?MEWS: Escalate Yellow: discuss with charge nurse/RN and consider discussing with provider and RRT  ?Notify: Charge Nurse/RN  ?Name of Charge Nurse/RN Notified Beverlee Nims RN  ?Date Charge Nurse/RN Notified 02/10/22  ?Time Charge Nurse/RN Notified 1840  ? ?Pt being admitted with Afib RVR.  Amiodarone IV is infusing.  Charge RN Beverlee Nims is aware.  Will continue to monitor. ?

## 2022-02-10 NOTE — Consult Note (Incomplete)
Cardiology Consultation:   Patient ID: Elizabeth Melton MRN: [id number]; DOB: Jan 31, 1941  Admit date: 02/09/2022 Date of Consult: 02/10/2022  PCP:  Kathyrn Lass, MD   [facility] Providers Cardiologist:  [name], MD   { }     Patient Profile:   Elizabeth Melton is a 81 y.o. 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 Patient had a submassive PE in 9/21 with saddle embolus, who is being seen 02/10/2022 for the evaluation of AFib at the request of Dr. Marlou Porch.  AAD Hx Amiodarone stopped historically with abn LFTs  11/22 showed EF 60-65%, mildly decreased RV systolic function with mild RV enlargement, mild MR, moderate TR, PASP 85 mmHg, mild aortic stenosis and mild aortic insufficiency, IVC dilated.   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    1/27: RHC demonstrated optimized filling pressures and severe PAH RHC Procedural Findings: Hemodynamics (mmHg) RA 5 RV 72/7 PA 68/27, mean 42 PCWP mean 9 Oxygen saturations: PA 62% AO 96% Cardiac Output (Fick) 3.38  Cardiac Index (Fick) 1.83 PVR 9.7 WU  History of Present Illness:   Elizabeth Melton was just discharged after a hospitalization here 01/25/22 - 01/28/22 2/2 acute/chronic respiratory failure 2/2 COPD exacerbation with underlying ILD, HF team also participated in her care, home O2 4-6L  Readmitted yesterday home health RN found her O2 sats "low", called EMS< found her in AFib w/RVR that by notes converted in route, in the ER another episodes of RVR converted with a post conversion pause . Cardiology consulted She also tested COVID +, given comorbid conditions planned to treat w/molnupiravir     Past Medical History:  Diagnosis Date   Acute on chronic diastolic (congestive) heart failure ([place])  05/19/2021   Acute respiratory failure with hypoxia (HCC) 09/06/2020   Atrial fibrillation with RVR (HCC)    Diabetes mellitus without complication (HCC)    Hyperlipidemia    Hypertension    Hypothyroidism    IPF (idiopathic pulmonary fibrosis) ([place])    Pneumonia due to COVID-19 virus 01/16/2021   Thrombocytopenia [facility])     Past Surgical History:  Procedure Laterality Date   RIGHT HEART CATH N/A 07/05/2021   Procedure: RIGHT HEART CATH;  Surgeon: [name], MD;  Location: [place] [facility];  Service: Cardiovascular;  Laterality: N/A;   RIGHT HEART CATH N/A 01/06/2022   Procedure: RIGHT HEART CATH;  Surgeon: Larey Dresser, MD;  Location: [place] [facility];  Service: Cardiovascular;  Laterality: N/A;   RIGHT/LEFT HEART CATH AND CORONARY ANGIOGRAPHY N/A 11/23/2021   Procedure: RIGHT/LEFT HEART CATH AND CORONARY ANGIOGRAPHY;  Surgeon: Larey Dresser, MD;  Location: [place] [facility];  Service: Cardiovascular;  Laterality: N/A;   TUBAL LIGATION       Home Medications:  Prior to Admission medications   Medication Sig Start Date End Date Taking? Authorizing Provider  acetaminophen (TYLENOL) 500 MG tablet Take 1,000 mg by mouth every 8 (eight) hours as needed for headache or mild pain (pain).   Yes [provider]  albuterol (VENTOLIN HFA) 108 (90 Base) MCG/ACT inhaler Inhale 2 puffs into the lungs every 6 (six) hours as needed for wheezing or shortness of breath. 02/08/22  Yes Brand Males, MD  apixaban (ELIQUIS) 5 MG TABS tablet Take 1 tablet (5  mg total) by mouth 2 (two) times daily. 11/24/21  Yes Larey Dresser, MD  b complex vitamins tablet Take 1 tablet by mouth daily.   Yes [provider]  bisacodyl (DULCOLAX) 10 MG suppository Place 10 mg rectally daily as needed for moderate constipation.   Yes [provider]  Budeson-Glycopyrrol-Formoterol (BREZTRI AEROSPHERE) 160-9-4.8 MCG/ACT AERO Inhale 2 puffs into the lungs in the morning and at  bedtime. 01/25/22  Yes Cobb, Karie Schwalbe, NP  diphenhydrAMINE (BENADRYL) 25 MG tablet Take 25 mg by mouth every 6 (six) hours as needed for allergies.   Yes [provider]  DULoxetine (CYMBALTA) 30 MG capsule Take 1 capsule (30 mg total) by mouth daily. 01/14/22  Yes Domenic Polite, MD  guaiFENesin (MUCINEX) 600 MG 12 hr tablet Take 1 tablet (600 mg total) by mouth 2 (two) times daily. Patient taking differently: Take 600 mg by mouth 2 (two) times daily. As needed 01/13/22  Yes Domenic Polite, MD  insulin glargine (LANTUS) 100 UNIT/ML injection Inject 10 Units into the skin daily.   Yes [provider]  levothyroxine (SYNTHROID, LEVOTHROID) 75 MCG tablet Take 75 mcg by mouth daily before breakfast.    Yes [provider]  lidocaine (LIDODERM) 5 % Place 1 patch onto the skin at bedtime. Remove & Discard patch within 12 hours or as directed by MD 01/13/22  Yes Domenic Polite, MD  lovastatin (MEVACOR) 40 MG tablet Take 40 mg by mouth at bedtime.   Yes [provider]  methocarbamol 1000 MG TABS Take 1,000 mg by mouth every 8 (eight) hours as needed for muscle spasms. 01/13/22  Yes Domenic Polite, MD  metoprolol succinate (TOPROL-XL) 25 MG 24 hr tablet Take 0.5 tablets (12.5 mg total) by mouth daily. 01/28/21  Yes [name], MD  nicotine (NICODERM CQ - DOSED IN MG/24 HOURS) 14 mg/24hr patch Place 14 mg onto the skin daily.   Yes [provider]  OXYGEN Inhale 4 L into the lungs continuous.   Yes [provider]  pantoprazole (PROTONIX) 40 MG tablet Take 1 tablet (40 mg total) by mouth daily. 01/14/22  Yes Domenic Polite, MD  Potassium Chloride (KLOR-CON PO) Take 20 mEq by mouth daily.   Yes [provider]  senna-docusate (SENOKOT-S) 8.6-50 MG tablet Take 2 tablets by mouth at bedtime. Patient taking differently: Take 2 tablets by mouth at bedtime. As needed 01/13/22  Yes Domenic Polite, MD  torsemide (DEMADEX) 20 MG tablet Patient takes 2  tablets once daily.   Yes [provider]  traMADol (ULTRAM) 50 MG tablet Take 1 tablet (50 mg total) by mouth every 6 (six) hours as needed for moderate pain. 01/13/22  Yes Domenic Polite, MD  Treprostinil (TYVASO DPI TITRATION KIT) 16 & 32 & 48 MCG POWD Inhale into the lungs. Inhale 1 breath 4 times per day   Yes [provider]  VITAMIN D PO Take 1,000 Units by mouth daily.   Yes [provider]  albuterol (PROVENTIL) (2.5 MG/3ML) 0.083% nebulizer solution Take 3 mLs (2.5 mg total) by nebulization every 4 (four) hours as needed for wheezing or shortness of breath. Patient not taking: Reported on 02/09/2022 01/13/22   Domenic Polite, MD  Blood Glucose Monitoring Suppl (TRUE METRIX METER) w/Device KIT 1 each by Other route in the morning and at bedtime. 12/25/20   [provider]  buPROPion (WELLBUTRIN XL) 150 MG 24 hr tablet Take 1 tablet (150 mg total) by mouth daily. Patient not taking: Reported  on 02/09/2022 12/20/21   Larey Dresser, MD  Calcium Carb-Cholecalciferol (CALCIUM + D3) 600-200 MG-UNIT TABS Take 2 tablets by mouth daily. Patient not taking: Reported on 02/09/2022    [provider]  glucose blood test strip 1 each by Other route as needed for other (blood sugar).    [provider]  Insulin Pen Needle (PEN NEEDLES 29GX1/2") 29G X 12MM MISC For insulin injection 05/22/21   [name], MD  Omega-3 Fatty Acids (FISH OIL) 1000 MG CAPS Take 1,000 mg by mouth daily. Patient not taking: Reported on 02/09/2022    [provider]  predniSONE (STERAPRED UNI-PAK 21 TAB) 10 MG (21) TBPK tablet Start 60 mg po daily, taper 10 mg daily until finish Patient not taking: Reported on 02/09/2022 01/28/22   Max Sane, MD  SMART SENSE THIN LANCETS 26G MISC 1 each by Does not apply route 2 (two) times daily.    [provider]    Inpatient Medications: Scheduled Meds:  apixaban  5 mg Oral BID   vitamin C  500 mg Oral Daily   buPROPion  150  mg Oral Daily   DULoxetine  30 mg Oral Daily   fluticasone furoate-vilanterol  1 puff Inhalation Daily   furosemide  60 mg Intravenous Q12H   guaiFENesin  600 mg Oral BID   insulin aspart  0-15 Units Subcutaneous TID WC   insulin aspart  0-5 Units Subcutaneous QHS   insulin glargine-yfgn  10 Units Subcutaneous Daily   levothyroxine  75 mcg Oral QAC breakfast   molnupiravir EUA  4 capsule Oral BID   nicotine  14 mg Transdermal Daily   pantoprazole  40 mg Oral Daily   pravastatin  40 mg Oral q1800   sodium chloride flush  3 mL Intravenous Q12H   sodium chloride flush  3 mL Intravenous Q12H   umeclidinium bromide  1 puff Inhalation Daily   zinc sulfate  220 mg Oral Daily   Continuous Infusions:  diltiazem (CARDIZEM) infusion 7.5 mg/hr (02/10/22 1128)   PRN Meds: acetaminophen **OR** acetaminophen, albuterol, hydrALAZINE, methocarbamol, ondansetron **OR** ondansetron (ZOFRAN) IV, traMADol  Allergies:    Allergies  Allergen Reactions   Lisinopril Other (See Comments)    Other reaction(s): cough 09/20/17    Social History:   Social History   Socioeconomic History   Marital status: Widowed    Spouse name: Not on file   Number of children: Not on file   Years of education: Not on file   Highest education level: Not on file  Occupational History   Occupation: retired  Tobacco Use   Smoking status: Some Days    Packs/day: 1.00    Years: 60.00    Pack years: 60.00    Types: Cigarettes    Start date: 12/11/1958   Smokeless tobacco: Former    Quit date: 09/24/1962   Tobacco comments:    3cigs per day as of 12/15/21 ep  Vaping Use   Vaping Use: Never used  Substance and Sexual Activity   Alcohol use: Yes    Comment: drinks beer 12 every 2 weeks   Drug use: No   Sexual activity: Not on file  Other Topics Concern   Not on file  Social History Narrative   Not on file   Social Determinants of Health   Financial Resource Strain: Not on file  Food Insecurity: No Food  Insecurity   Worried About [place] in the Last Year: Never true  Ran Out of Food in the Last Year: Never true  Transportation Needs: No Transportation Needs   Lack of Transportation (Medical): No   Lack of Transportation (Non-Medical): No  Physical Activity: Not on file  Stress: Not on file  Social Connections: Not on file  Intimate Partner Violence: Not on file    Family History:   Family History  Problem Relation Age of Onset   Diabetes Mother    Kidney disease Mother    Hypertension Mother    Other Father        tuberculosis   Hypertension Sister    Hyperlipidemia Sister      ROS:  Please see the history of present illness.  All other ROS reviewed and negative.     Physical Exam/Data:   Vitals:   02/10/22 1100 02/10/22 1115 02/10/22 1130 02/10/22 1145  BP: 109/84 (!) 110/58 98/68 111/71  Pulse: (!) 168 (!) 51 (!) 156 (!) 155  Resp: _0 Temp:      TempSrc:      SpO2: 100% 98% 97% 97%  Weight:      Height:        Intake/Output Summary (Last 24 hours) at 02/10/2022 1151 Last data filed at 02/10/2022 1128 Gross per 24 hour  Intake 2.59 ml  Output --  Net 2.59 ml   Last 3 Weights 02/09/2022 02/09/2022 01/26/2022  Weight (lbs) 155 lb 170 lb 160 lb 8 oz  Weight (kg) 70.308 kg 77.111 kg 72.802 kg     Body mass index is 25.02 kg/m.  General:  Well nourished, well developed, in no acute distress*** HEENT: normal Neck: no JVD Vascular: No carotid bruits; Distal pulses 2+ bilaterally Cardiac:  normal S1, S2; RRR; no murmur *** Lungs:  clear to auscultation bilaterally, no wheezing, rhonchi or rales  Abd: soft, nontender, no hepatomegaly  Ext: no edema Musculoskeletal:  No deformities, BUE and BLE strength normal and equal Skin: warm and dry  Neuro:  CNs 2-12 intact, no focal abnormalities noted Psych:  Normal affect   EKG:  The EKG was personally reviewed and demonstrates:    AFib 167bpm,   Old 01/25/22: old 69bpm, PAC  Telemetry:   Telemetry was personally reviewed and demonstrates:  ***  Relevant CV Studies:  11/04/22: TTE  1. Left ventricular ejection fraction, by estimation, is 60 to 65%. The  left ventricle has normal function. The left ventricle has no regional  wall motion abnormalities. Left ventricular diastolic parameters were  normal.   2. Right ventricular systolic function is mildly reduced. The right  ventricular size is mildly enlarged. There is severely elevated pulmonary  artery systolic pressure.   3. Left atrial size was mildly dilated.   4. Right atrial size was moderately dilated.   5. The pericardial effusion is posterior to the left ventricle.   6. The mitral valve is abnormal. Mild mitral valve regurgitation. No  evidence of mitral stenosis. Moderate mitral annular calcification.   7. Significant pulmonary HTN based on TR velocity . Tricuspid valve  regurgitation is moderate.   8. CW across AV not done Manual trace from AR tracing shows stable mean  gradient of 9 mmHg . The aortic valve is normal in structure. Aortic valve  regurgitation is mild. Mild aortic valve stenosis.   9. The inferior vena cava is dilated in size with <50% respiratory  variability, suggesting right atrial pressure of 15 mmHg.   01/06/22: RHV Right Heart Pressures RHC Procedural Findings: Hemodynamics (mmHg)  RA 5 RV 72/7 PA 68/27, mean 42 PCWP mean 9  Oxygen saturations: PA 62% AO 96%  Cardiac Output (Fick) 3.38  Cardiac Index (Fick) 1.83 PVR 9.7 WU    11/23/21: L/RHC 1. Normal filling pressures.  2. Severe pulmonary arterial hypertension.  3. Low cardiac output (though oxygen saturation was also low) 4. Nonobstructive mild CAD.    Plan to start Tyvaso for PH-ILD.  Also, needs to increase home oxygen for now to 5L [place].    Laboratory Data:  High Sensitivity Troponin:   Recent Labs  Lab 01/25/22 2356 01/26/22 0320  TROPONINIHS 29* 31*     Chemistry Recent Labs  Lab 02/09/22 1522  02/09/22 1546 02/10/22 0527 02/10/22 0942  NA  --  140 137 139  K  --  4.3 4.2 4.4  CL  --  111 105 103  CO2  --   --  24 27  GLUCOSE  --  126* 168* 190*  BUN  --  55* 27* 25*  CREATININE  --  2.80* 1.22* 1.18*  CALCIUM  --   --  8.3* 8.8*  MG 2.1  --   --   --   GFRNONAA  --   --  45* 47*  ANIONGAP  --   --  8 9    Recent Labs  Lab 02/10/22 0942  PROT 6.2*  ALBUMIN 3.1*  AST 32  ALT 20  ALKPHOS 62  BILITOT 1.2   Lipids No results for input(s): CHOL, TRIG, HDL, LABVLDL, LDLCALC, CHOLHDL in the last 168 hours.  Hematology Recent Labs  Lab 02/09/22 1441 02/09/22 1546 02/10/22 0336  WBC 14.1*  --  10.2  RBC 4.72  --  4.25  HGB 15.8* 10.5* 13.9  HCT 47.2* 31.0* 43.5  MCV 100.0  --  102.4*  MCH 33.5  --  32.7  MCHC 33.5  --  32.0  RDW 17.9*  --  18.0*  PLT 153  --  162   Thyroid No results for input(s): TSH, FREET4 in the last 168 hours.  BNPNo results for input(s): BNP, PROBNP in the last 168 hours.  DDimer No results for input(s): DDIMER in the last 168 hours.   Radiology/Studies:  DG Chest Port 1 View  Result Date: 02/09/2022 CLINICAL DATA:  Cardiac arrhythmia. EXAM: PORTABLE CHEST 1 VIEW COMPARISON:  January 25, 2022. FINDINGS: Stable cardiomegaly. Stable interstitial densities are noted throughout both lungs concerning for fibrosis or chronic scarring. Bony thorax is unremarkable. IMPRESSION: Stable cardiomegaly. Stable bilateral interstitial densities are noted concerning for fibrosis or chronic scarring. Electronically Signed   By: Marijo Conception M.D.   On: 02/09/2022 15:55     Assessment and Plan:   Paroxysmal AFib w/RVR CHA2DS2Vasc is 8, on eliquis  She has no good AAD options Pulmonary issues will make rate/rhythm control challening Amio in the short term being used to try and gain rate/rhythm control. She has severe p.HTN and RV failure, palliative, GOC conversations recommended    Risk Assessment/Risk Scores:    For questions or updates,  please contact [place] Please consult [email or web address] for contact info under    Signed, Baldwin [place], PA-C  02/10/2022 11:51 AM

## 2022-02-10 NOTE — Progress Notes (Signed)
?  02/10/22 2020  ?Vitals  ?Temp (!) 97.3 ?F (36.3 ?C)  ?Temp Source Oral  ?BP 100/60  ?MAP (mmHg) 70  ?BP Location Left Arm  ?BP Method Automatic  ?Patient Position (if appropriate) Lying  ?Pulse Rate (!) 135  ?ECG Heart Rate (!) 132  ?Resp 20  ?Level of Consciousness  ?Level of Consciousness Alert  ?MEWS COLOR  ?MEWS Score Color Red  ?Oxygen Therapy  ?SpO2 94 %  ?Pain Assessment  ?Pain Scale 0-10  ?Pain Score 0  ?MEWS Score  ?MEWS Temp 0  ?MEWS Systolic 1  ?MEWS Pulse 3  ?MEWS RR 0  ?MEWS LOC 0  ?MEWS Score 4  ? ? ?

## 2022-02-10 NOTE — Progress Notes (Addendum)
? ?Progress Note ? ?Patient Name: Elizabeth Melton ?Date of Encounter: 02/10/2022 ? ?[facility] Cardiologist: [name], MD  ? ?Subjective  ? ?Went into atrial fibrillation with rapid ventricular response earlier this morning.  Earlier telemetry showed sinus rhythm with PVCs, EMS strip showed atrial fibrillation course.  Had a postconversion 5-second pause. ? ?Currently appears to be having more respiratory distress ? ?Inpatient Medications  ?  ?Scheduled Meds: ? apixaban  5 mg Oral BID  ? vitamin C  500 mg Oral Daily  ? buPROPion  150 mg Oral Daily  ? DULoxetine  30 mg Oral Daily  ? fluticasone furoate-vilanterol  1 puff Inhalation Daily  ? furosemide  60 mg Intravenous Q12H  ? guaiFENesin  600 mg Oral BID  ? insulin aspart  0-15 Units Subcutaneous TID WC  ? insulin aspart  0-5 Units Subcutaneous QHS  ? insulin glargine-yfgn  10 Units Subcutaneous Daily  ? levothyroxine  75 mcg Oral QAC breakfast  ? molnupiravir EUA  4 capsule Oral BID  ? nicotine  14 mg Transdermal Daily  ? pantoprazole  40 mg Oral Daily  ? pravastatin  40 mg Oral q1800  ? sodium chloride flush  3 mL Intravenous Q12H  ? sodium chloride flush  3 mL Intravenous Q12H  ? umeclidinium bromide  1 puff Inhalation Daily  ? zinc sulfate  220 mg Oral Daily  ? ?Continuous Infusions: ? diltiazem (CARDIZEM) infusion 7.5 mg/hr (02/10/22 1128)  ? ?PRN Meds: ?acetaminophen **OR** acetaminophen, albuterol, hydrALAZINE, methocarbamol, ondansetron **OR** ondansetron (ZOFRAN) IV, traMADol  ? ?Vital Signs  ?  ?Vitals:  ? 02/10/22 1052 02/10/22 1100 02/10/22 1115 02/10/22 1130  ?BP: 99/75 109/84 (!) 110/58 98/68  ?Pulse: (!) 166 (!) 168 (!) 51 (!) 156  ?Resp: _0 ?Temp:      ?TempSrc:      ?SpO2: 94% 100% 98% 97%  ?Weight:      ?Height:      ? ? ?Intake/Output Summary (Last 24 hours) at 02/10/2022 1142 ?Last data filed at 02/10/2022 1128 ?Gross per 24 hour  ?Intake 2.59 ml  ?Output --  ?Net 2.59 ml  ? ?Last 3 Weights 02/09/2022 02/09/2022 01/26/2022   ?Weight (lbs) 155 lb 170 lb 160 lb 8 oz  ?Weight (kg) 70.308 kg 77.111 kg 72.802 kg  ?   ?Physical Exam  ? ?GEN: Respiratory distress.   ?Neck: No JVD ?Cardiac: Irregularly irregular tachycardic, no murmurs, rubs, or gallops.  ?Respiratory: Crackles bilaterally. ?GI: Soft, nontender, non-distended  ?MS: No edema; No deformity. ?Neuro:  Nonfocal  ?Psych: Normal affect  ? ?Labs  ?  ?High Sensitivity Troponin:   ?Recent Labs  ?Lab 01/25/22 ?2356 01/26/22 ?0320  ?TROPONINIHS 29* 31*  ?   ?Chemistry ?Recent Labs  ?Lab 02/09/22 ?1522 02/09/22 ?1546 02/10/22 ?7416 02/10/22 ?3845  ?NA  --  140 137 139  ?K  --  4.3 4.2 4.4  ?CL  --  111 105 103  ?CO2  --   --  24 27  ?GLUCOSE  --  126* 168* 190*  ?BUN  --  55* 27* 25*  ?CREATININE  --  2.80* 1.22* 1.18*  ?CALCIUM  --   --  8.3* 8.8*  ?MG 2.1  --   --   --   ?PROT  --   --   --  6.2*  ?ALBUMIN  --   --   --  3.1*  ?AST  --   --   --  32  ?ALT  --   --   --  20  ?ALKPHOS  --   --   --  62  ?BILITOT  --   --   --  1.2  ?GFRNONAA  --   --  45* 6*  ?ANIONGAP  --   --  8 9  ?  ?Lipids No results for input(s): CHOL, TRIG, HDL, LABVLDL, LDLCALC, CHOLHDL in the last 168 hours.  ?Hematology ?Recent Labs  ?Lab 02/09/22 ?1441 02/09/22 ?1546 02/10/22 ?0336  ?WBC 14.1*  --  10.2  ?RBC 4.72  --  4.25  ?HGB 15.8* 10.5* 13.9  ?HCT 47.2* 31.0* 43.5  ?MCV 100.0  --  102.4*  ?MCH 33.5  --  32.7  ?MCHC 33.5  --  32.0  ?RDW 17.9*  --  18.0*  ?PLT 153  --  162  ? ?Thyroid No results for input(s): TSH, FREET4 in the last 168 hours.  ?BNPNo results for input(s): BNP, PROBNP in the last 168 hours.  ?DDimer No results for input(s): DDIMER in the last 168 hours.  ? ?Radiology  ?  ?DG Chest Port 1 View ? ?Result Date: 02/09/2022 ?CLINICAL DATA:  Cardiac arrhythmia. EXAM: PORTABLE CHEST 1 VIEW COMPARISON:  January 25, 2022. FINDINGS: Stable cardiomegaly. Stable interstitial densities are noted throughout both lungs concerning for fibrosis or chronic scarring. Bony thorax is unremarkable. IMPRESSION:  Stable cardiomegaly. Stable bilateral interstitial densities are noted concerning for fibrosis or chronic scarring. Electronically Signed   By: Marijo Conception M.D.   On: 02/09/2022 15:55   ? ?Cardiac Studies  ? ?Last ECHO EF 11/22 EF 65%. ? ?[place] 01/06/22: ?Hemodynamics (mmHg) ?RA 5 ?RV 72/7 ?PA 68/27, mean 42 ?PCWP mean 9 ?Oxygen saturations: ?PA 62% ?AO 96% ?Cardiac Output (Fick) 3.38  ?Cardiac Index (Fick) 1.83 ?PVR 9.7 WU ?  ?CARDIAC CATH: 11/23/2021 ?1. Normal filling pressures.  ?2. Severe pulmonary arterial hypertension.  ?3. Low cardiac output (though oxygen saturation was also low) ?4. Nonobstructive mild CAD.  ?  ? ?Patient Profile  ?   ?81 y.o. female with A-fib RVR intolerant to amiodarone secondary to abnormal LFTs.  Postconversion pause of approximately 5 seconds noted.  COVID-positive. ? ?Assessment & Plan  ?  ?-We stopped her very low-dose metoprolol 12.5 mg yesterday because of her postconversion pause.  She is back in atrial fibrillation now with rapid ventricular response.  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 ?-I discussed with electrophysiology team.  She has previously seen Dr. Rayann Heman in the past.  She was last seen in the A-fib clinic by Roderic Palau in 02/17/2021.  Treatment options are poor.  ?-Appears to be in worsening respiratory distress.  It is possible that her COVID pneumonia is driving her return of atrial fibrillation with RVR. ? ?We think it would make sense now to help stabilize her with IV amiodarone, which we will not use long-term because of prior elevated LFTs.  I will go ahead and start.   Obviously given her underlying interstitial lung disease we do not want long-term amiodarone therapy.  I think this could be a good temporizing measure especially since she was in sinus rhythm as of a  few hours ago.  This is really her only good option.  Cardioversion would not hold in this situation.  I also discussed with Dr. Aundra Dubin. ? ?Candee Furbish,  MD ? ? ?For questions or updates, please contact [place] ?Please consult [email or web address] for contact info under  ? ?  ?   ?Signed, ?Candee Furbish, MD  ?02/10/2022, 11:42 AM   ? ?

## 2022-02-10 NOTE — ED Notes (Addendum)
RN at bedside administering medications when pt went into afib with a rate of 130-160s. EKG obtained. Cards and admitting MD paged. Pt denies CP, endorses worsening SOB.  ?

## 2022-02-10 NOTE — ED Notes (Signed)
Cardiology at bedside.

## 2022-02-10 NOTE — Consult Note (Signed)
Palliative Care Consult Note                                  Date: 02/10/2022   Patient Name: Elizabeth Melton  DOB: 19-Oct-1941  MRN: [id number]  Age / Sex: 81 y.o., female  PCP: Kathyrn Lass, MD Referring Physician: Domenic Polite, MD  Reason for Consultation: Establishing goals of care  HPI/Patient Profile: 81 y.o. female  with past medical history of COPD, interstitial lung disease, chronic respiratory failure with hypoxia on 5-6 L oxygen via nasal cannula at home, severe pulmonary hypertension, chronic diastolic CHF, diabetes mellitus type 2, hypertension, paroxysmal A. fib, history of pulmonary embolism on Eliquis, PAD.  She presented on referral by home health nurse due to hypoxia, on 70 L of oxygen over the past several weeks following recent prolonged hospitalization.  In the ambulance she was in A-fib with RVR rate of 230 but then self converted to sinus rhythm.  In emergency room she had a recurrent A-fib and a 4-second pause and again converted back to sinus rhythm.  Currently being followed by cardiology.  She was admitted on 02/09/2022 with paroxysmal A-fib with RVR, COVID-19 virus infection, interstitial lung disease, and others.  Of note she has been seen extensively by our service in the past.  All previous discussions resulted in full scope/full code.  PMT was consulted for goals of care conversations.  Past Medical History:  Diagnosis Date   Acute on chronic diastolic (congestive) heart failure ([place]) 05/19/2021   Acute respiratory failure with hypoxia (HCC) 09/06/2020   Atrial fibrillation with RVR (HCC)    Diabetes mellitus without complication ([place])    Hyperlipidemia    Hypertension    Hypothyroidism    IPF (idiopathic pulmonary fibrosis) ([place])    Pneumonia due to COVID-19 virus 01/16/2021   Thrombocytopenia (HCC)     Subjective:   This NP Walden Field reviewed medical records, received report from team, assessed the  patient and then meet at the patient's bedside to discuss diagnosis, prognosis, GOC, EOL wishes disposition and options.  I met with the patient at the bedside.  No family was present at the bedside.   Concept of Palliative Care was introduced as specialized medical care for people and their families living with serious illness.  If focuses on providing relief from the symptoms and stress of a serious illness.  The goal is to improve quality of life for both the patient and the family. Values and goals of care important to patient and family were attempted to be elicited.  Created space and opportunity for patient  and family to explore thoughts and feelings regarding current medical situation   Natural trajectory and current clinical status were discussed. Questions and concerns addressed. Patient  encouraged to call with questions or concerns.    Patient/Family Understanding of Illness: She notes her lungs are in bad shape.  She seems understand that she is quite ill.  She states things are getting harder.  We had further discussion on her clinical status including her A-fib likely causing acute exacerbations of dyspnea and fatigue.  She does note that she tends to have these occasionally and typically will lie down, drink water and eventually feels better.  Query going in and out of A-fib more frequently than known.  We also discussed the poor prognosis with her advanced interstitial lung disease and she seems to understand this.  We also discussed  COVID-19 infection and high risk for respiratory issues due to her underlying comorbidities.  Life Review: The patient lives with her daughter.  However, her other daughter [name] is her designated surrogate.  She has a granddaughter whom she seems to a door.  Goals: Full scope of care, try to live as long as possible.  Today's Discussion: In addition to the discussion about her clinical status and life review, we had further discussion about  various topics.  At baseline she has limited mobility due to fatigue and shortness of breath.  Her oxygen has increased from 5 L/min to 7 L/min at home.  She notes on and off acute decompensations with her symptoms.  Overall she feels she had been doing okay at home, fixing food, other ADLs.  We reviewed that she has been admitted to the hospital a few times in the past few months.  We discussed how taxing it can be to bounce back and forth to the hospital.  We discussed that with her severe cardiopulmonary diseases she will likely continue to have decompensations, A-fib, worsening shortness of breath.  We discussed the progressive nature of her diseases and trajectory of illness.  We discussed that there will come a point where there may not be much we can do for her to improve her situation.  She seems understand this.  At this time she continues to want full scope and full code.  However, she seems less adamant about this.  She does note that it is getting harder to deal with all this.  I wonder if she may be starting to realize how difficult it can be to manage severe comorbidities with progressive decline.  She also expresses a strong faith in God.  I offered consult to spiritual care and she readily agreed.  I provided emotional general support therapeutic listening, empathy, therapeutic touch, sharing of stories, laughter, and other techniques.  I answered all questions and addressed all concerns to the best my ability.  During my visit with the patient she was noted to be back in A-fib with RVR and heart rate of 170.  Nursing was at bedside.  Review of Systems  Constitutional:  Positive for fatigue.  Respiratory:  Positive for shortness of breath.   Gastrointestinal:  Negative for abdominal pain, nausea and vomiting.  Neurological:  Positive for weakness.   Objective:   Primary Diagnoses: Present on Admission:  PAF (paroxysmal atrial fibrillation) (HCC)  Type 2 diabetes mellitus with  hyperglycemia (HCC)  Tobacco abuse  ILD (interstitial lung disease) ([place])  Hypothyroidism  Essential (primary) hypertension  COVID-19 virus infection  Hyperlipidemia/PAD  Mood disorder (HCC)  Atrial flutter with rapid ventricular response ([place])   Physical Exam Vitals and nursing note reviewed.  Constitutional:      Appearance: Normal appearance. She is ill-appearing.  HENT:     Head: Normocephalic and atraumatic.  Cardiovascular:     Rate and Rhythm: Tachycardia present. Rhythm irregular.  Pulmonary:     Breath sounds: Decreased air movement present.     Comments: Mild to moderately labored breathing; NRB mask in place Abdominal:     General: Abdomen is flat.     Palpations: Abdomen is soft.  Skin:    General: Skin is warm and dry.  Neurological:     General: No focal deficit present.     Mental Status: She is alert.    Vital Signs:  BP 108/87    Pulse 71    Temp 97.6 F (36.4 C) (Oral)  Resp (!) 24    Ht _0  (1.676 m)    Wt 70.3 kg    SpO2 91%    BMI 25.02 kg/m   Palliative Assessment/Data: 30%    Advanced Care Planning:   Primary Decision Maker: PATIENT  Code Status/Advance Care Planning: Full code  A discussion was had today regarding advanced directives. Concepts specific to code status, artifical feeding and hydration, continued IV antibiotics and rehospitalization was had.  The difference between a aggressive medical intervention path and a palliative comfort care path for this patient at this time was had.   Decisions/Changes to ACP: No changes today  Assessment & Plan:   Impression: 81 year old female with multiple chronic comorbidities including advanced interstitial lung disease with poor prognosis, now with A-fib and RVR with high risk for recurrent A-fib.  Patient likely having more episodes of A-fib then are known given her description of acute decompensation cycles.  Heart rate as high as 230 in the ambulance, was 170 in the ER during my  visit.  Now with COVID-19 and high risk for ARDS/MODS.  Overall her prognosis is poor.  She has previously been adamant about full scope/full code.  While she remains in the space of electing full scope of care, she seems less adamant about this.  She describes that things are "getting harder".  She may be coming to the realization and understanding about the progression of her disease.  SUMMARY OF RECOMMENDATIONS   Continue full code for now Continue full scope Continue goals of care discussion as her clinical course progresses Consult for spiritual care Outpatient palliative care to monitor her inpatient progress PMT will continue to follow  Symptom Management:  Per primary team PMT is available to assist as needed  Prognosis:  Unable to determine  Discharge Planning:  To Be Determined   Discussed with: Patient, medical team, nursing team    Thank you for allowing [place] to participate in the care of [place] PMT will continue to support holistically.  Time Total: 90 min  Greater than 50%  of this time was spent counseling and coordinating care related to the above assessment and plan.  Signed by: Walden Field, NP Palliative Medicine Team  Team Phone # [phone] (Nights/Weekends)  02/10/2022, 12:43 PM

## 2022-02-11 ENCOUNTER — Inpatient Hospital Stay (HOSPITAL_COMMUNITY): Payer: Medicare HMO

## 2022-02-11 DIAGNOSIS — I480 Paroxysmal atrial fibrillation: Secondary | ICD-10-CM | POA: Diagnosis not present

## 2022-02-11 LAB — CBC
HCT: 42.2 % (ref 36.0–46.0)
Hemoglobin: 14.1 g/dL (ref 12.0–15.0)
MCH: 33.3 pg (ref 26.0–34.0)
MCHC: 33.4 g/dL (ref 30.0–36.0)
MCV: 99.8 fL (ref 80.0–100.0)
Platelets: 148 10*3/uL — ABNORMAL LOW (ref 150–400)
RBC: 4.23 MIL/uL (ref 3.87–5.11)
RDW: 17.3 % — ABNORMAL HIGH (ref 11.5–15.5)
WBC: 8.4 10*3/uL (ref 4.0–10.5)
nRBC: 1.4 % — ABNORMAL HIGH (ref 0.0–0.2)

## 2022-02-11 LAB — COMPREHENSIVE METABOLIC PANEL
ALT: 19 U/L (ref 0–44)
AST: 23 U/L (ref 15–41)
Albumin: 2.7 g/dL — ABNORMAL LOW (ref 3.5–5.0)
Alkaline Phosphatase: 58 U/L (ref 38–126)
Anion gap: 8 (ref 5–15)
BUN: 20 mg/dL (ref 8–23)
CO2: 25 mmol/L (ref 22–32)
Calcium: 8.3 mg/dL — ABNORMAL LOW (ref 8.9–10.3)
Chloride: 102 mmol/L (ref 98–111)
Creatinine, Ser: 1.06 mg/dL — ABNORMAL HIGH (ref 0.44–1.00)
GFR, Estimated: 53 mL/min — ABNORMAL LOW (ref >60)
Glucose, Bld: 140 mg/dL — ABNORMAL HIGH (ref 70–99)
Potassium: 3.2 mmol/L — ABNORMAL LOW (ref 3.5–5.1)
Sodium: 135 mmol/L (ref 135–145)
Total Bilirubin: 0.7 mg/dL (ref 0.3–1.2)
Total Protein: 6 g/dL — ABNORMAL LOW (ref 6.5–8.1)

## 2022-02-11 LAB — GLUCOSE, CAPILLARY
Glucose-Capillary: 163 mg/dL — ABNORMAL HIGH (ref 70–99)
Glucose-Capillary: 160 mg/dL — ABNORMAL HIGH (ref 70–99)
Glucose-Capillary: 115 mg/dL — ABNORMAL HIGH (ref 70–99)
Glucose-Capillary: 178 mg/dL — ABNORMAL HIGH (ref 70–99)

## 2022-02-11 LAB — C-REACTIVE PROTEIN: CRP: 1.2 mg/dL — ABNORMAL HIGH (ref <1.0)

## 2022-02-11 LAB — FERRITIN: Ferritin: 385 ng/mL — ABNORMAL HIGH (ref 11–307)

## 2022-02-11 MED ORDER — FUROSEMIDE 10 MG/ML IJ SOLN
40.0000 mg | Freq: Two times a day (BID) | INTRAMUSCULAR | Status: AC
  Administered 2022-02-11 (×2): 40 mg via INTRAVENOUS
  Filled 2022-02-11 (×2): qty 4

## 2022-02-11 MED ORDER — POTASSIUM CHLORIDE CRYS ER 20 MEQ PO TBCR
40.0000 meq | EXTENDED_RELEASE_TABLET | Freq: Two times a day (BID) | ORAL | Status: AC
  Administered 2022-02-11 (×2): 40 meq via ORAL
  Filled 2022-02-11 (×2): qty 2

## 2022-02-11 NOTE — Progress Notes (Signed)
PROGRESS NOTE    Elizabeth Melton  TKZ:[id number] DOB: 06-14-1941 DOA: 02/09/2022 PCP: Kathyrn Lass, MD  Narrative36 year old female with history of COPD, interstitial lung disease, chronic respiratory failure with hypoxia on 5-6 L oxygen via nasal cannula at home, severe pulmonary hypertension, chronic diastolic CHF, diabetes mellitus type 2, hypertension, paroxysmal A. fib, history of pulmonary embolism on Eliquis, PAD presented to the ED as her home health nurse noticed that her oxygenation was lower than usual.  Patient reports using 7 to 8 L of oxygen for the last several weeks, following her prolonged hospitalization from 1/18-2/3 she was admitted to [facility] from 2/16-2/18 treated for COPD exacerbation with steroids then. -In the ED she was noted to be in rapid A-fib, spontaneously converted, followed by posttermination pause of 5 seconds, seen by cardiology, metoprolol held -3/3 back in rapid A-fib, started on amiodarone gtt.   Subjective: -Feels okay overall, breathing about the same, does not feel like it is much worse than usual -Converted to sinus rhythm  Assessment & Plan:  Acute on chronic hypoxic respiratory failure Severe interstitial lung disease Acute on chronic diastolic CHF Severe pulmonary hypertension -Echo 11/22 noted EF of 60-65%, moderate TR, PASP of 85 mmHg -Seen by pulmonary and heart failure team during hospitalization in late January, right heart cath noted severe pulmonary hypertension, treated with steroids as well then -Appears close to euvolemic, however will attempt to diurese to keep overall net negative given extremely poor reservoir -Has SARS COVID-19 infection however does not appear symptomatic from this, chest x-ray is unchanged, notes chronic fibrotic changes consistent with known ILD, inflammatory markers unremarkable as well -Obtain CT chest today noncontrast -Very poor prognosis she was seen by palliative care in January and again in February, she  understands poor prognosis however wants to remain full code with full scope of treatment for now  * PAF (paroxysmal atrial fibrillation) ([place]) with RVR -3/2 had had posttermination pause of 5 seconds, metoprolol held -Now in rapid A-fib, diltiazem attempted, heart rate remained consistently elevated despite this -Cards following, started on IV amiodarone 3/3 as a temporizing measure, converted to sinus rhythm -Continue Eliquis, restart Toprol  COVID-19 virus infection -No apparent symptoms other than her chronic pulmonary issues -Started on molnupiravir, day 3 -Chest x-ray with chronic fibrosis, will obtain CT chest today, inflammatory markers are low   CKD 3a Creatinine stable, continue current dose of torsemide   COPD with chronic bronchitis and emphysema (HCC) Interstitial lung disease Chronic respiratory failure on 5 to 6 L O2 --Outpatient follow-up with pulmonary   Type 2 diabetes mellitus with hyperglycemia ([place])- a1c in 10/2021: 7.6 Continue lantus 10 units daily and moderate SSI   Essential (primary) hypertension- -Stable, meds as above   Hyperlipidemia/PAD-  Continue mevacor   Hypothyroidism-  Last TSH 05/2021 and wnl Continue home synthroid   OSA -CPAP QHS   ILD (interstitial lung disease) ([place]) -She has advanced disease with a very poor prognosis -Palliative care follows her as an outpatient  -Continue home meds - Albuterol, Breztri, Mucinex, and Tyvaso (family asked to bring that in)  Goals of care discussion: Patient has advanced ILD, chronic respiratory failure, COPD on 6 L home O2, recently has been using 7 to 8 L, also has severe PAH, cor pulmonale, diastolic CHF, CKD, A-fib RVR, her prognosis is poor, this has been discussed again with the patient at length today.  She was seen by palliative care in January and again in February , recommended consideration of DNR, she wishes  to remain full code with full scope of treatment for now, she tells me that she  wishes for [place] to attempt resuscitation and life support so her family can see her before she passes, when it gets to the point   DVT prophylaxis: Apixaban Code Status: Full code Family Communication: Discussed patient in detail, daughter updated from patient's room Disposition Plan: Home pending improvement in hypoxia  consultants: Cardiology  Procedures:   Antimicrobials:    Objective: Vitals:   02/11/22 0300 02/11/22 0400 02/11/22 0416 02/11/22 0840  BP: (!) 103/51 (!) 106/57 101/61 (!) 111/92  Pulse:    62  Resp:    18  Temp:  97.8 F (36.6 C)  97.9 F (36.6 C)  TempSrc:  Oral  Oral  SpO2:   100% 91%  Weight:   70.9 kg   Height:        Intake/Output Summary (Last 24 hours) at 02/11/2022 1057 Last data filed at 02/11/2022 0319 Gross per 24 hour  Intake 807.78 ml  Output 700 ml  Net 107.78 ml   Filed Weights   02/09/22 1409 02/10/22 1829 02/11/22 0416  Weight: 70.3 kg 71.1 kg 70.9 kg    Examination:  General exam: Chronically ill pleasant female sitting up in bed, AAOx3, HEENT: Positive JVD CVS: S1-S2, regular rate rhythm Lungs: Few basilar Rales Abdomen: Soft, nontender, bowel sounds present Extremities: No edema Skin: No rashes Psychiatry: Judgement and insight appear normal. Mood & affect appropriate.     Data Reviewed:   CBC: Recent Labs  Lab 02/09/22 1441 02/09/22 1546 02/10/22 0336 02/11/22 0333  WBC 14.1*  --  10.2 8.4  NEUTROABS 11.0*  --   --   --   HGB 15.8* 10.5* 13.9 14.1  HCT 47.2* 31.0* 43.5 42.2  MCV 100.0  --  102.4* 99.8  PLT 153  --  162 022*   Basic Metabolic Panel: Recent Labs  Lab 02/09/22 1522 02/09/22 1546 02/10/22 0527 02/10/22 0942 02/11/22 0333  NA  --  140 137 139 135  K  --  4.3 4.2 4.4 3.2*  CL  --  111 105 103 102  CO2  --   --  _0 GLUCOSE  --  126* 168* 190* 140*  BUN  --  55* 27* 25* 20  CREATININE  --  2.80* 1.22* 1.18* 1.06*  CALCIUM  --   --  8.3* 8.8* 8.3*  MG 2.1  --   --   --   --     GFR: Estimated Creatinine Clearance: 40.3 mL/min (A) (by C-G formula based on SCr of 1.06 mg/dL (H)). Liver Function Tests: Recent Labs  Lab 02/10/22 0942 02/11/22 0333  AST 32 23  ALT 20 19  ALKPHOS 62 58  BILITOT 1.2 0.7  PROT 6.2* 6.0*  ALBUMIN 3.1* 2.7*   No results for input(s): LIPASE, AMYLASE in the last 168 hours. No results for input(s): AMMONIA in the last 168 hours. Coagulation Profile: No results for input(s): INR, PROTIME in the last 168 hours. Cardiac Enzymes: No results for input(s): CKTOTAL, CKMB, CKMBINDEX, TROPONINI in the last 168 hours. BNP (last 3 results) No results for input(s): PROBNP in the last 8760 hours. HbA1C: No results for input(s): HGBA1C in the last 72 hours. CBG: Recent Labs  Lab 02/10/22 0812 02/10/22 1159 02/10/22 1626 02/10/22 2039 02/11/22 0620  GLUCAP 134* 181* 206* 170* 163*   Lipid Profile: No results for input(s): CHOL, HDL, LDLCALC, TRIG, CHOLHDL, LDLDIRECT in the  last 72 hours. Thyroid Function Tests: No results for input(s): TSH, T4TOTAL, FREET4, T3FREE, THYROIDAB in the last 72 hours. Anemia Panel: Recent Labs    02/10/22 0942 02/11/22 0333  FERRITIN 428* 385*   Urine analysis:    Component Value Date/Time   COLORURINE YELLOW (A) 01/26/2022 0241   APPEARANCEUR CLEAR (A) 01/26/2022 0241   APPEARANCEUR Clear 09/20/2021 1449   LABSPEC 1.008 01/26/2022 0241   PHURINE 5.0 01/26/2022 0241   GLUCOSEU NEGATIVE 01/26/2022 0241   HGBUR NEGATIVE 01/26/2022 0241   BILIRUBINUR NEGATIVE 01/26/2022 0241   BILIRUBINUR Negative 09/20/2021 1449   KETONESUR NEGATIVE 01/26/2022 0241   PROTEINUR NEGATIVE 01/26/2022 0241   NITRITE NEGATIVE 01/26/2022 0241   LEUKOCYTESUR NEGATIVE 01/26/2022 0241   Sepsis Labs: _0 (procalcitonin:4,lacticidven:4)  ) Recent Results (from the past 240 hour(s))  Resp Panel by RT-PCR (Flu A&B, Covid) Nasopharyngeal Swab     Status: Abnormal   Collection Time: 02/09/22  2:59 PM    Specimen: Nasopharyngeal Swab; Nasopharyngeal(NP) swabs in vial transport medium  Result Value Ref Range Status   SARS Coronavirus 2 by RT PCR POSITIVE (A) NEGATIVE Final    Comment: (NOTE) SARS-CoV-2 target nucleic acids are DETECTED.  The SARS-CoV-2 RNA is generally detectable in upper respiratory specimens during the acute phase of infection. Positive results are indicative of the presence of the identified virus, but do not rule out bacterial infection or co-infection with other pathogens not detected by the test. Clinical correlation with patient history and other diagnostic information is necessary to determine patient infection status. The expected result is Negative.  Fact Sheet for Patients: EntrepreneurPulse.com.au  Fact Sheet for Healthcare Providers: IncredibleEmployment.be  This test is not yet approved or cleared by the [place] FDA and  has been authorized for detection and/or diagnosis of SARS-CoV-2 by FDA under an Emergency Use Authorization (EUA).  This EUA will remain in effect (meaning this test can be used) for the duration of  the COVID-19 declaration under Section 564(b)(1) of the A ct, 21 U.S.C. section 360bbb-3(b)(1), unless the authorization is terminated or revoked sooner.     Influenza A by PCR NEGATIVE NEGATIVE Final   Influenza B by PCR NEGATIVE NEGATIVE Final    Comment: (NOTE) The Xpert Xpress SARS-CoV-2/FLU/RSV plus assay is intended as an aid in the diagnosis of influenza from Nasopharyngeal swab specimens and should not be used as a sole basis for treatment. Nasal washings and aspirates are unacceptable for Xpert Xpress SARS-CoV-2/FLU/RSV testing.  Fact Sheet for Patients: EntrepreneurPulse.com.au  Fact Sheet for Healthcare Providers: IncredibleEmployment.be  This test is not yet approved or cleared by the [place] FDA and has been authorized for detection  and/or diagnosis of SARS-CoV-2 by FDA under an Emergency Use Authorization (EUA). This EUA will remain in effect (meaning this test can be used) for the duration of the COVID-19 declaration under Section 564(b)(1) of the Act, 21 U.S.C. section 360bbb-3(b)(1), unless the authorization is terminated or revoked.  Performed at [facility], [place] [address] [place], [place] [address]      Radiology Studies: DG Chest Port 1 View  Result Date: 02/09/2022 CLINICAL DATA:  Cardiac arrhythmia. EXAM: PORTABLE CHEST 1 VIEW COMPARISON:  January 25, 2022. FINDINGS: Stable cardiomegaly. Stable interstitial densities are noted throughout both lungs concerning for fibrosis or chronic scarring. Bony thorax is unremarkable. IMPRESSION: Stable cardiomegaly. Stable bilateral interstitial densities are noted concerning for fibrosis or chronic scarring. Electronically Signed   By: Marijo Conception M.D.   On: 02/09/2022  15:55     Scheduled Meds:  apixaban  5 mg Oral BID   vitamin C  500 mg Oral Daily   buPROPion  150 mg Oral Daily   DULoxetine  30 mg Oral Daily   fluticasone furoate-vilanterol  1 puff Inhalation Daily   furosemide  40 mg Intravenous Q12H   guaiFENesin  600 mg Oral BID   insulin aspart  0-15 Units Subcutaneous TID WC   insulin aspart  0-5 Units Subcutaneous QHS   insulin glargine-yfgn  10 Units Subcutaneous Daily   levothyroxine  75 mcg Oral QAC breakfast   metoprolol succinate  25 mg Oral Daily   molnupiravir EUA  4 capsule Oral BID   nicotine  14 mg Transdermal Daily   pantoprazole  40 mg Oral Daily   potassium chloride  40 mEq Oral BID   pravastatin  40 mg Oral q1800   sodium chloride flush  3 mL Intravenous Q12H   umeclidinium bromide  1 puff Inhalation Daily   zinc sulfate  220 mg Oral Daily   Continuous Infusions:  amiodarone 30 mg/hr (02/11/22 1040)     LOS: 1 day    Time spent: 33mn  PDomenic Polite MD [facility]   02/11/2022, 10:57 AM

## 2022-02-11 NOTE — Progress Notes (Addendum)
? ?Progress Note ? ?Patient Name: Elizabeth Melton ?Date of Encounter: 02/11/2022 ? ?[facility] Cardiologist: [name], MD  ? ?Subjective  ? ?Yesterday was having atrial fibrillation paroxysmal with rapid ventricular response.  IV amiodarone utilized.  Increased respiratory effort and mid afternoon. ? ?Inpatient Medications  ?  ?Scheduled Meds: ? apixaban  5 mg Oral BID  ? vitamin C  500 mg Oral Daily  ? buPROPion  150 mg Oral Daily  ? DULoxetine  30 mg Oral Daily  ? fluticasone furoate-vilanterol  1 puff Inhalation Daily  ? furosemide  40 mg Intravenous Q12H  ? guaiFENesin  600 mg Oral BID  ? insulin aspart  0-15 Units Subcutaneous TID WC  ? insulin aspart  0-5 Units Subcutaneous QHS  ? insulin glargine-yfgn  10 Units Subcutaneous Daily  ? levothyroxine  75 mcg Oral QAC breakfast  ? metoprolol succinate  25 mg Oral Daily  ? molnupiravir EUA  4 capsule Oral BID  ? nicotine  14 mg Transdermal Daily  ? pantoprazole  40 mg Oral Daily  ? potassium chloride  40 mEq Oral BID  ? pravastatin  40 mg Oral q1800  ? sodium chloride flush  3 mL Intravenous Q12H  ? umeclidinium bromide  1 puff Inhalation Daily  ? zinc sulfate  220 mg Oral Daily  ? ?Continuous Infusions: ? amiodarone 30 mg/hr (02/10/22 1828)  ? diltiazem (CARDIZEM) infusion Stopped (02/10/22 1216)  ? ?PRN Meds: ?acetaminophen **OR** acetaminophen, albuterol, hydrALAZINE, methocarbamol, ondansetron **OR** ondansetron (ZOFRAN) IV, traMADol  ? ?Vital Signs  ?  ?Vitals:  ? 02/11/22 0300 02/11/22 0400 02/11/22 0416 02/11/22 0840  ?BP: (!) 103/51 (!) 106/57 101/61 (!) 111/92  ?Pulse:    62  ?Resp:    18  ?Temp:  97.8 ?F (36.6 ?C)  97.9 ?F (36.6 ?C)  ?TempSrc:  Oral  Oral  ?SpO2:   100% 91%  ?Weight:   70.9 kg   ?Height:      ? ? ?Intake/Output Summary (Last 24 hours) at 02/11/2022 0924 ?Last data filed at 02/11/2022 0319 ?Gross per 24 hour  ?Intake 807.78 ml  ?Output 700 ml  ?Net 107.78 ml  ? ?Last 3 Weights 02/11/2022 02/10/2022 02/09/2022  ?Weight (lbs) 156  lb 4.9 oz 156 lb 12 oz 155 lb  ?Weight (kg) 70.9 kg 71.1 kg 70.308 kg  ?   ? ?Telemetry  ?  ?Paroxysmal atrial fibrillation with rapid ventricular response- Personally Reviewed ? ?ECG  ?  ?No new- Personally Reviewed ? ?Physical Exam  ? ?Fairly comfortable, nasal cannula oxygen in place.  Fine crackles heard on lungs.  Stable.  Heart regular rate and rhythm ? ?Labs  ?  ?High Sensitivity Troponin:   ?Recent Labs  ?Lab 01/25/22 ?2356 01/26/22 ?0320  ?TROPONINIHS 29* 31*  ?   ?Chemistry ?Recent Labs  ?Lab 02/09/22 ?1522 02/09/22 ?1546 02/10/22 ?1610 02/10/22 ?9604 02/11/22 ?0333  ?NA  --    < > 137 139 135  ?K  --    < > 4.2 4.4 3.2*  ?CL  --    < > 105 103 102  ?CO2  --   --  _0 ?GLUCOSE  --    < > 168* 190* 140*  ?BUN  --    < > 27* 25* 20  ?CREATININE  --    < > 1.22* 1.18* 1.06*  ?CALCIUM  --   --  8.3* 8.8* 8.3*  ?MG 2.1  --   --   --   --   ?  PROT  --   --   --  6.2* 6.0*  ?ALBUMIN  --   --   --  3.1* 2.7*  ?AST  --   --   --  32 23  ?ALT  --   --   --  20 19  ?ALKPHOS  --   --   --  24 15  ?BILITOT  --   --   --  1.2 0.7  ?GFRNONAA  --   --  45* 47* 53*  ?ANIONGAP  --   --  _0 ? < > = values in this interval not displayed.  ?  ?Lipids No results for input(s): CHOL, TRIG, HDL, LABVLDL, LDLCALC, CHOLHDL in the last 168 hours.  ?Hematology ?Recent Labs  ?Lab 02/09/22 ?1441 02/09/22 ?1546 02/10/22 ?0336 02/11/22 ?0333  ?WBC 14.1*  --  10.2 8.4  ?RBC 4.72  --  4.25 4.23  ?HGB 15.8* 10.5* 13.9 14.1  ?HCT 47.2* 31.0* 43.5 42.2  ?MCV 100.0  --  102.4* 99.8  ?MCH 33.5  --  32.7 33.3  ?MCHC 33.5  --  32.0 33.4  ?RDW 17.9*  --  18.0* 17.3*  ?PLT 153  --  162 148*  ? ?Thyroid No results for input(s): TSH, FREET4 in the last 168 hours.  ?BNPNo results for input(s): BNP, PROBNP in the last 168 hours.  ?DDimer No results for input(s): DDIMER in the last 168 hours.  ? ?Radiology  ?  ?DG Chest Port 1 View ? ?Result Date: 02/09/2022 ?CLINICAL DATA:  Cardiac arrhythmia. EXAM: PORTABLE CHEST 1 VIEW COMPARISON:  January 25, 2022. FINDINGS: Stable cardiomegaly. Stable interstitial densities are noted throughout both lungs concerning for fibrosis or chronic scarring. Bony thorax is unremarkable. IMPRESSION: Stable cardiomegaly. Stable bilateral interstitial densities are noted concerning for fibrosis or chronic scarring. Electronically Signed   By: Marijo Conception M.D.   On: 02/09/2022 15:55   ? ?Cardiac Studies  ? ?Last ECHO EF 11/22 EF 65%. ?  ?[place] 01/06/22: ?Hemodynamics (mmHg) ?RA 5 ?RV 72/7 ?PA 68/27, mean 42 ?PCWP mean 9 ?Oxygen saturations: ?PA 62% ?AO 96% ?Cardiac Output (Fick) 3.38  ?Cardiac Index (Fick) 1.83 ?PVR 9.7 WU ?  ?CARDIAC CATH: 11/23/2021 ?1. Normal filling pressures.  ?2. Severe pulmonary arterial hypertension.  ?3. Low cardiac output (though oxygen saturation was also low) ?4. Nonobstructive mild CAD.  ? ?Patient Profile  ?   ?81 y.o. female with A-fib RVR intolerant to amiodarone secondary to abnormal LFTs.  Postconversion pause of approximately 5 seconds noted.  COVID-positive.  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 ?Assessment & Plan  ?  ?Paroxysmal atrial fibrillation ?- Lets try to maintain sinus rhythm. ?-Yesterday discussed with EP and her primary cardiologist.  We will utilize amiodarone IV again today.  Obviously with her underlying lung condition this is suboptimal however options are extremely limited.  Try to successfully load and maintain normal rhythm.  She did have about last night of A-fib RVR again but this converted back to sinus rhythm. ?- She is on low-dose metoprolol 25 a day. ?- She is also on Eliquis 5 mg twice a day for anticoagulation. ?-She is demonstrated postconversion pauses. ? ?COVID ?- On molnupiravir ? 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  Medications reviewed. ? ?Chronic diastolic heart failure ?-Currently receiving furosemide 40 mg IV every 12. ? ? ? ?For  questions or updates, please  contact [facility] ?Please consult [email or web address] for contact info under  ? ?  ?   ?Signed, ?Candee Furbish, MD  ?02/11/2022, 9:24 AM   ? ?

## 2022-02-12 DIAGNOSIS — N63 Unspecified lump in unspecified breast: Secondary | ICD-10-CM | POA: Diagnosis present

## 2022-02-12 DIAGNOSIS — U071 COVID-19: Secondary | ICD-10-CM

## 2022-02-12 DIAGNOSIS — I480 Paroxysmal atrial fibrillation: Secondary | ICD-10-CM | POA: Diagnosis not present

## 2022-02-12 DIAGNOSIS — Z711 Person with feared health complaint in whom no diagnosis is made: Secondary | ICD-10-CM

## 2022-02-12 DIAGNOSIS — I4891 Unspecified atrial fibrillation: Secondary | ICD-10-CM

## 2022-02-12 DIAGNOSIS — I4892 Unspecified atrial flutter: Secondary | ICD-10-CM

## 2022-02-12 DIAGNOSIS — N179 Acute kidney failure, unspecified: Secondary | ICD-10-CM

## 2022-02-12 DIAGNOSIS — Z789 Other specified health status: Secondary | ICD-10-CM

## 2022-02-12 DIAGNOSIS — J849 Interstitial pulmonary disease, unspecified: Secondary | ICD-10-CM

## 2022-02-12 DIAGNOSIS — I455 Other specified heart block: Secondary | ICD-10-CM

## 2022-02-12 DIAGNOSIS — Z7189 Other specified counseling: Secondary | ICD-10-CM

## 2022-02-12 DIAGNOSIS — Z515 Encounter for palliative care: Secondary | ICD-10-CM

## 2022-02-12 LAB — BASIC METABOLIC PANEL
Anion gap: 8 (ref 5–15)
BUN: 20 mg/dL (ref 8–23)
CO2: 24 mmol/L (ref 22–32)
Calcium: 8.6 mg/dL — ABNORMAL LOW (ref 8.9–10.3)
Chloride: 102 mmol/L (ref 98–111)
Creatinine, Ser: 1.2 mg/dL — ABNORMAL HIGH (ref 0.44–1.00)
GFR, Estimated: 46 mL/min — ABNORMAL LOW (ref >60)
Glucose, Bld: 114 mg/dL — ABNORMAL HIGH (ref 70–99)
Potassium: 4.1 mmol/L (ref 3.5–5.1)
Sodium: 134 mmol/L — ABNORMAL LOW (ref 135–145)

## 2022-02-12 LAB — GLUCOSE, CAPILLARY
Glucose-Capillary: 110 mg/dL — ABNORMAL HIGH (ref 70–99)
Glucose-Capillary: 212 mg/dL — ABNORMAL HIGH (ref 70–99)
Glucose-Capillary: 90 mg/dL (ref 70–99)
Glucose-Capillary: 230 mg/dL — ABNORMAL HIGH (ref 70–99)

## 2022-02-12 LAB — CBC
HCT: 42.8 % (ref 36.0–46.0)
Hemoglobin: 14.4 g/dL (ref 12.0–15.0)
MCH: 33.5 pg (ref 26.0–34.0)
MCHC: 33.6 g/dL (ref 30.0–36.0)
MCV: 99.5 fL (ref 80.0–100.0)
Platelets: 156 10*3/uL (ref 150–400)
RBC: 4.3 MIL/uL (ref 3.87–5.11)
RDW: 17.3 % — ABNORMAL HIGH (ref 11.5–15.5)
WBC: 8 10*3/uL (ref 4.0–10.5)
nRBC: 1 % — ABNORMAL HIGH (ref 0.0–0.2)

## 2022-02-12 LAB — C-REACTIVE PROTEIN: CRP: 5.9 mg/dL — ABNORMAL HIGH (ref <1.0)

## 2022-02-12 LAB — FERRITIN: Ferritin: 381 ng/mL — ABNORMAL HIGH (ref 11–307)

## 2022-02-12 IMAGING — DX DG CHEST 1V PORT
1 series · 1 of 1 positions shown · non-contrast
Comparison: 01/05/2022

CLINICAL DATA: Hypoxia.

EXAM:
PORTABLE CHEST 1 VIEW

[chest]
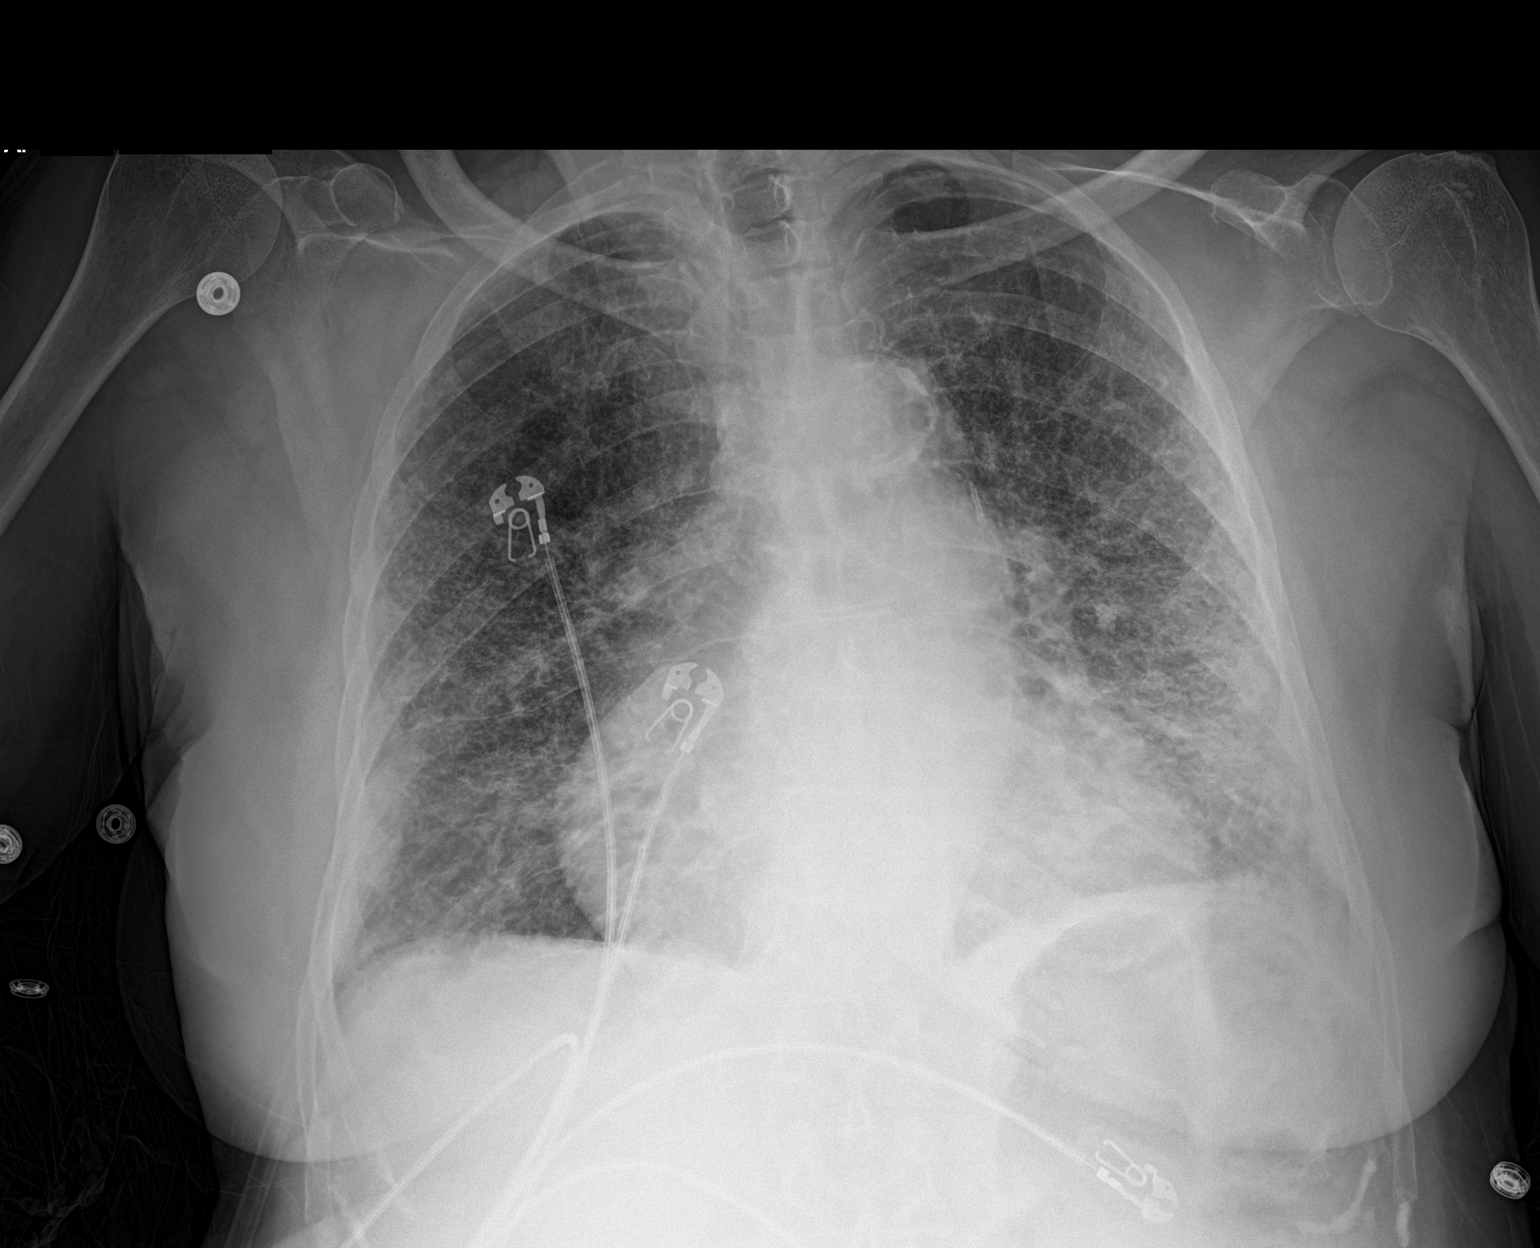

[1 of 1 positions shown; findings below may reference images not displayed]

FINDINGS: Stable enlarged cardiac silhouette and tortuous and calcified
thoracic aorta. No significant change in diffuse prominence of the
interstitial markings without superimposed airspace opacity or
pleural fluid. Unremarkable bones.
IMPRESSION: Stable cardiomegaly and pulmonary fibrosis. No acute abnormality.
Stable cardiomegaly and pulmonary fibrosis. No acute abnormality.

## 2022-02-12 MED ORDER — GUAIFENESIN ER 600 MG PO TB12
1200.0000 mg | ORAL_TABLET | Freq: Two times a day (BID) | ORAL | Status: DC
  Administered 2022-02-12 – 2022-02-20 (×16): 1200 mg via ORAL
  Filled 2022-02-12 (×16): qty 2

## 2022-02-12 MED ORDER — TORSEMIDE 20 MG PO TABS
40.0000 mg | ORAL_TABLET | Freq: Every day | ORAL | Status: DC
  Administered 2022-02-12 – 2022-02-13 (×2): 40 mg via ORAL
  Filled 2022-02-12 (×2): qty 2

## 2022-02-12 MED ORDER — BUDESONIDE 0.5 MG/2ML IN SUSP
0.5000 mg | Freq: Two times a day (BID) | RESPIRATORY_TRACT | Status: DC
Start: 2022-02-12 — End: 2022-02-21
  Administered 2022-02-12 – 2022-02-21 (×18): 0.5 mg via RESPIRATORY_TRACT
  Filled 2022-02-12 (×18): qty 2

## 2022-02-12 MED ORDER — ARFORMOTEROL TARTRATE 15 MCG/2ML IN NEBU
15.0000 ug | INHALATION_SOLUTION | Freq: Two times a day (BID) | RESPIRATORY_TRACT | Status: DC
  Administered 2022-02-12 – 2022-02-21 (×18): 15 ug via RESPIRATORY_TRACT
  Filled 2022-02-12 (×18): qty 2

## 2022-02-12 MED ORDER — METHYLPREDNISOLONE SODIUM SUCC 125 MG IJ SOLR
80.0000 mg | INTRAMUSCULAR | Status: AC
  Administered 2022-02-12 – 2022-02-13 (×2): 80 mg via INTRAVENOUS
  Filled 2022-02-12 (×2): qty 2

## 2022-02-12 MED ORDER — HYALURONIDASE HUMAN 150 UNIT/ML IJ SOLN
150.0000 [IU] | Freq: Once | INTRAMUSCULAR | Status: AC
  Administered 2022-02-12: 150 [IU] via INTRADERMAL
  Filled 2022-02-12: qty 1

## 2022-02-12 MED ORDER — REVEFENACIN 175 MCG/3ML IN SOLN
175.0000 ug | Freq: Every day | RESPIRATORY_TRACT | Status: DC
  Administered 2022-02-13 – 2022-02-21 (×9): 175 ug via RESPIRATORY_TRACT
  Filled 2022-02-12 (×11): qty 3

## 2022-02-12 MED ORDER — AMIODARONE HCL 200 MG PO TABS
400.0000 mg | ORAL_TABLET | Freq: Two times a day (BID) | ORAL | Status: AC
  Administered 2022-02-12 – 2022-02-18 (×14): 400 mg via ORAL
  Filled 2022-02-12 (×14): qty 2

## 2022-02-12 NOTE — Progress Notes (Signed)
Daily Progress Note   Patient Name: Elizabeth Melton       Date: 02/12/2022 DOB: 1941-10-15  Age: 81 y.o. MRN#: [id number] Attending Physician: Domenic Polite, MD Primary Care Physician: Kathyrn Lass, MD Admit Date: 02/09/2022  Reason for Consultation/Follow-up: Establishing goals of care  Subjective: Chart review performed. Received report from primary RN - no acute concerns.  Went to visit patient at bedside - no family/visitors present. Patient was sitting in chair awake, alert, oriented, and able to participate in conversation. No signs or non-verbal gestures of pain or discomfort noted. No respiratory distress, increased work of breathing, or secretions noted. She reports feeling better. She wears chronic 6L O2 [place] and currently is on 9L.   She is familiar with PMT from extensive visits inpatient as well as outpatient. She has a clear understanding of her acute medical situation and that her prognosis is poor with high risk for decline. She confirms her goal remains for full code/full scope. I attempted to set boundaries around medical interventions. I asked when she is intubated, would she want her life prolonged with indefinite life support vs just a trial period? She tells me she is not sure of the answer today, but does seem to indicate she would not want her life prolonged artificially long-term. She states, "I'll have to make that decision when it happens." Education provided that when that day comes, she likely will not be able to communicate/verbalize her wishes due to the severity of her illness that would lead to a resuscitation event. She seemed surprised by this but did express understanding. She then tells me her daughter would be the one to make "those kinds of decisions." She  confirms her daughter is HCPOA.   Medication education provided on the difference between inhalers and nebulizers per her request.  Patient remains hopeful she can improve and return home. Patient expresses joy over the fruit her daughter was able to bring her, which she ate at the end of my visit.  All questions and concerns addressed. Encouraged to call with questions and/or concerns. PMT card provided.   Length of Stay: 2  Current Medications: Scheduled Meds:   amiodarone  400 mg Oral BID   apixaban  5 mg Oral BID   arformoterol  15 mcg Nebulization BID   vitamin C  500  mg Oral Daily   budesonide (PULMICORT) nebulizer solution  0.5 mg Nebulization BID   buPROPion  150 mg Oral Daily   DULoxetine  30 mg Oral Daily   guaiFENesin  1,200 mg Oral BID   insulin aspart  0-15 Units Subcutaneous TID WC   insulin aspart  0-5 Units Subcutaneous QHS   insulin glargine-yfgn  10 Units Subcutaneous Daily   levothyroxine  75 mcg Oral QAC breakfast   methylPREDNISolone (SOLU-MEDROL) injection  80 mg Intravenous Q24H   metoprolol succinate  25 mg Oral Daily   molnupiravir EUA  4 capsule Oral BID   nicotine  14 mg Transdermal Daily   pantoprazole  40 mg Oral Daily   pravastatin  40 mg Oral q1800   revefenacin  175 mcg Nebulization Daily   sodium chloride flush  3 mL Intravenous Q12H   torsemide  40 mg Oral Daily   zinc sulfate  220 mg Oral Daily    Continuous Infusions:   PRN Meds: acetaminophen **OR** acetaminophen, albuterol, hydrALAZINE, methocarbamol, ondansetron **OR** ondansetron (ZOFRAN) IV, traMADol  Physical Exam Vitals and nursing note reviewed.  Constitutional:      General: She is not in acute distress.    Appearance: She is ill-appearing.  Pulmonary:     Effort: No respiratory distress.  Skin:    General: Skin is warm and dry.  Neurological:     Mental Status: She is alert and oriented to person, place, and time.     Motor: Weakness present.  Psychiatric:         Attention and Perception: Attention normal.        Behavior: Behavior is cooperative.        Cognition and Memory: Cognition and memory normal.            Vital Signs: BP (!) 151/107 (BP Location: Left Arm)    Pulse 67    Temp 97.9 F (36.6 C) (Oral)    Resp 13    Ht 5' 6" (1.676 m)    Wt 71.4 kg    SpO2 93%    BMI 25.39 kg/m  SpO2: SpO2: 93 % O2 Device: O2 Device: High Flow Nasal Cannula O2 Flow Rate: O2 Flow Rate (L/min): 12 L/min  Intake/output summary:  Intake/Output Summary (Last 24 hours) at 02/12/2022 1519 Last data filed at 02/12/2022 1100 Gross per 24 hour  Intake 835.88 ml  Output 150 ml  Net 685.88 ml   LBM: Last BM Date : 02/11/22 Baseline Weight: Weight: 77.1 kg Most recent weight: Weight: 71.4 kg       Palliative Assessment/Data: PPS 50%      Patient Active Problem List   Diagnosis Date Noted   Breast mass 02/12/2022   Atrial flutter with rapid ventricular response ([place]) 02/10/2022   PAF (paroxysmal atrial fibrillation) ([place]) 02/09/2022   COVID-19 virus infection 02/09/2022   Mood disorder ([place]) 02/09/2022   Chronic obstructive pulmonary disease with acute exacerbation ([place]) 01/26/2022   Acute renal failure superimposed on stage 3b chronic kidney disease ([place]) 01/26/2022   Thrombocytopenia (HCC)    Elevated lactic acid level    Respiratory distress    Hypoxia    Low back pain 01/11/2022   Acute respiratory failure ([place]) 12/29/2021   COPD with chronic bronchitis and emphysema (HCC)    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  12/28/2021   Hyperkalemia 12/28/2021   CKD (chronic kidney disease) 12/28/2021   Acute right-sided CHF (congestive heart failure) ([place])  11/06/2021   Elevated troponin 11/03/2021   Constipation 11/03/2021   Pulmonary HTN (HCC)    Acute on chronic systolic (congestive) heart failure ([place]) 05/19/2021   Acute on chronic respiratory failure with hypoxia (HCC) 05/19/2021   SOB (shortness of breath)  05/19/2021   AF (paroxysmal atrial fibrillation) ([place]) 05/19/2021   Acute on chronic congestive heart failure (HCC)    Demand ischemia (HCC)    Acute respiratory failure with hypoxia ([place]) 09/06/2020   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 female  with past medical history of COPD, interstitial lung disease, chronic respiratory failure with hypoxia on 5-6 L oxygen via nasal cannula at home, severe pulmonary hypertension, chronic diastolic CHF, diabetes mellitus type 2, hypertension, paroxysmal A. fib, history of pulmonary embolism on Eliquis, PAD.  She presented on referral by home health nurse due to hypoxia, on 70 L of oxygen over the past several weeks following recent prolonged hospitalization.  In the ambulance she was in A-fib with RVR rate of 230 but then self converted to sinus rhythm.  In emergency room she had a recurrent A-fib and a 4-second pause and again converted back to sinus rhythm.  Currently being followed by cardiology.  She was admitted on 02/09/2022 with paroxysmal A-fib with RVR, COVID-19 virus infection, interstitial lung disease, and others.   Of note she has been seen extensively by our service in the past.  All previous discussions resulted in full scope/full code.  Assessment: Acute on chronic hypoxemic respiratory failure IPF pHTN Diastolic HF COPD with exacerbation COVID -19 positive  OSA Unvaccinated against COVID 19  Recommendations/Plan: Continue full code/full scope  Patient is not open to set boundaries around the  use of artificial life-prolonging measures; however, does seem to indicate she would not want them long term Patient's goal is to improve and return home Continue outpatient Palliative Care - she is established with [facility] Patient confirms daughter is HCPOA PMT will continue to follow peripherally. If there are any imminent needs please call the service directly  Goals of Care and Additional Recommendations: Limitations on Scope of Treatment: Full Scope Treatment  Code Status:    Code Status Orders  (From admission, onward)           Start     Ordered   02/09/22 1708  Full code  Continuous        02/09/22 1712           Code Status History     Date Active Date Inactive Code Status Order ID Comments User Context   01/26/2022 0216 01/28/2022 1927 Full Code [id number]  Athena Masse, MD ED   12/28/2021 1523 01/13/2022 1656 Full Code [id number]  Orma Flaming, MD ED   11/23/2021 1802 11/24/2021 0145 Full Code [id number]  Larey Dresser, MD Inpatient   11/03/2021 0957 11/07/2021 1842 Full Code [id number]  Karmen Bongo, MD ED   07/13/2021 2347 07/15/2021 1829 Full Code [id number]  Etta Quill, DO ED   07/05/2021 1206 07/05/2021 1821 Full Code [id number]  [name], MD Inpatient  05/19/2021 0035 05/22/2021 1700 Full Code [id number]  Rhetta Mura, DO ED   05/19/2021 0031 05/19/2021 0035 Full Code [id number]  Rhetta Mura, DO ED   01/16/2021 1655 01/27/2021 2118 Full Code [id number]  [place] Eric J, DO ED   09/05/2020 2035 09/10/2020 2103 Full Code [id number]  [place], [place], DO ED       Prognosis:  Poor, < 6 months would not be surprising  Discharge Planning: Home with [place] was discussed with primary RN, patient  Thank you for allowing the Palliative Medicine Team to assist in the care of this patient.   Total Time 35 minutes Prolonged Time Billed  no       Greater than 50%  of this time was spent counseling and coordinating care related  to the above assessment and plan.  Lin Landsman, NP  Please contact Palliative Medicine Team phone at [phone] for questions and concerns.

## 2022-02-12 NOTE — Progress Notes (Signed)
Patient's IV infiltrated, nurse tried the other arm, which caused burning sensation. Discussed with Dr. Marlou Porch, will stop IV amio and transition to PO today.  ?

## 2022-02-12 NOTE — Progress Notes (Addendum)
PROGRESS NOTE    Elizabeth Melton  YHC:[id number] DOB: Mar 14, 1941 DOA: 02/09/2022 PCP: Kathyrn Lass, MD  Narrative79 year old female with history of COPD, IPF, chronic respiratory failure with hypoxia on 5-6 L oxygen via nasal cannula at home, severe pulmonary hypertension, chronic diastolic CHF, diabetes mellitus type 2, hypertension, paroxysmal A. fib, history of pulmonary embolism on Eliquis, PAD presented to the ED as her home health nurse noticed that her oxygenation was lower than usual.  Patient reports using 7 to 8 L of oxygen for the last several weeks, following her prolonged hospitalization from 1/18-2/3 at [facility] she was admitted to [facility] from 2/16-2/18 treated for COPD exacerbation with steroids then. -In the ED she was noted to be in rapid A-fib, spontaneously converted, followed by posttermination pause of 5 seconds, seen by cardiology, metoprolol held -3/3 back in rapid A-fib, started on amiodarone gtt. -O2 needs higher than usual  Subjective: -Feels okay overall, breathing about the same, does not feel like it is much worse than usual -Converted to sinus rhythm  Assessment & Plan:  Acute on chronic hypoxic respiratory failure Pulmonary fibrosis Acute on chronic diastolic CHF Severe pulmonary hypertension -Echo 11/22 noted EF of 60-65%, moderate TR, PASP of 85 mmHg -Seen by pulmonary and heart failure team during hospitalization in late January, right heart cath noted severe pulmonary hypertension, treated with steroids as well then -Clinically appears euvolemic, diuresed with IV Lasix yesterday, cards restarting torsemide today -Has SARS COVID-19 infection however does not appear symptomatic from this, chest x-ray is unchanged, notes chronic fibrotic changes consistent with known ILD, ferritin stable, CRP slightly higher today, do not suspect PE, she is anticoagulated with Eliquis -CT chest yesterday notes chronic lung findings-no acute findings, chronic fibrosis -Will request  pulmonary input, currently requiring 10 to 12 L O2 -Seen by palliative care last 2 hospitalizations as well as this admission, she wishes for full code, full scope of Rx  * PAF (paroxysmal atrial fibrillation) ([place]) with RVR -3/2 had had posttermination pause of 5 seconds, metoprolol held -then developed rapid A-fib this admission, uncontrolled on Cardizem gtt., options limited with lung disease, started on amiodarone as a temporizing measure, cardiology following -Continue Eliquis, restart Toprol  COVID-19 virus infection -No apparent symptoms other than her chronic pulmonary issues -Started on molnupiravir, day 4 -Chest x-ray with chronic fibrosis, CT chest does not suggest COVID-pneumonia -Repeat CRP ferritin tomorrow   CKD 3a Creatinine stable, continue current dose of torsemide  Abnormal breast imaging CT chest noted 9 mm right upper quadrant breast mass, will need mammogram as outpatient   COPD with chronic bronchitis and emphysema ([place]) Idiopathic pulmonary fibrosis Chronic respiratory failure on 5 to 6 L O2 --Outpatient follow-up with pulmonary   Type 2 diabetes mellitus with hyperglycemia ([place])- a1c in 10/2021: 7.6 Continue lantus 10 units daily and moderate SSI   Essential (primary) hypertension- -Stable, meds as above   Hyperlipidemia/PAD-  Continue mevacor   Hypothyroidism-  Last TSH 05/2021 and wnl Continue home synthroid   OSA -CPAP QHS   Goals of care discussion: Patient has advanced ILD, chronic respiratory failure, COPD on 6 L home O2, recently has been using 7 to 8 L, also has severe PAH, cor pulmonale, diastolic CHF, CKD, A-fib RVR, her prognosis is poor, this has been discussed again with the patient at length today.  She was seen by palliative care in January and again in February , recommended consideration of DNR, she wishes to remain full code with full scope of treatment for  now, she tells me that she wishes for [place] to attempt resuscitation and life  support so her family can see her before she passes, when it gets to the point.  DVT prophylaxis: Apixaban Code Status: Full code Family Communication: Discussed patient in detail, daughter updated from patient's room yesterday Disposition Plan: Home pending improvement in hypoxia  consultants: Cardiology, pulmonary  Procedures:   Antimicrobials:    Objective: Vitals:   02/12/22 0631 02/12/22 0748 02/12/22 0822 02/12/22 0830  BP:  97/68 110/61   Pulse:  60 (!) 57 (!) 59  Resp:  (!) _0 Temp:  97.9 F (36.6 C)    TempSrc:  Oral    SpO2:  90% 90% (!) 85%  Weight: 71.4 kg     Height:        Intake/Output Summary (Last 24 hours) at 02/12/2022 1046 Last data filed at 02/12/2022 0600 Gross per 24 hour  Intake 295.88 ml  Output 150 ml  Net 145.88 ml   Filed Weights   02/10/22 1829 02/11/22 0416 02/12/22 0631  Weight: 71.1 kg 70.9 kg 71.4 kg    Examination:  General exam: Chronically ill pleasant female sitting up in bed, AAOx3, HEENT: Positive JVD CVS: S1-S2, regular rate rhythm Lungs: Few basilar Rales Abdomen: Soft, nontender, bowel sounds present Extremities: No edema Skin: No rashes Psychiatry: Judgement and insight appear normal. Mood & affect appropriate.     Data Reviewed:   CBC: Recent Labs  Lab 02/09/22 1441 02/09/22 1546 02/10/22 0336 02/11/22 0333 02/12/22 0221  WBC 14.1*  --  10.2 8.4 8.0  NEUTROABS 11.0*  --   --   --   --   HGB 15.8* 10.5* 13.9 14.1 14.4  HCT 47.2* 31.0* 43.5 42.2 42.8  MCV 100.0  --  102.4* 99.8 99.5  PLT 153  --  162 148* 720   Basic Metabolic Panel: Recent Labs  Lab 02/09/22 1522 02/09/22 1546 02/10/22 0527 02/10/22 0942 02/11/22 0333 02/12/22 0221  NA  --  140 137 139 135 134*  K  --  4.3 4.2 4.4 3.2* 4.1  CL  --  111 105 103 102 102  CO2  --   --  _1 GLUCOSE  --  126* 168* 190* 140* 114*  BUN  --  55* 27* 25* 20 20  CREATININE  --  2.80* 1.22* 1.18* 1.06* 1.20*  CALCIUM  --   --  8.3* 8.8*  8.3* 8.6*  MG 2.1  --   --   --   --   --    GFR: Estimated Creatinine Clearance: 38.5 mL/min (A) (by C-G formula based on SCr of 1.2 mg/dL (H)). Liver Function Tests: Recent Labs  Lab 02/10/22 0942 02/11/22 0333  AST 32 23  ALT 20 19  ALKPHOS 62 58  BILITOT 1.2 0.7  PROT 6.2* 6.0*  ALBUMIN 3.1* 2.7*   No results for input(s): LIPASE, AMYLASE in the last 168 hours. No results for input(s): AMMONIA in the last 168 hours. Coagulation Profile: No results for input(s): INR, PROTIME in the last 168 hours. Cardiac Enzymes: No results for input(s): CKTOTAL, CKMB, CKMBINDEX, TROPONINI in the last 168 hours. BNP (last 3 results) No results for input(s): PROBNP in the last 8760 hours. HbA1C: No results for input(s): HGBA1C in the last 72 hours. CBG: Recent Labs  Lab 02/11/22 0620 02/11/22 1143 02/11/22 1655 02/11/22 2128 02/12/22 0627  GLUCAP 163* 160* 115* 178* 110*   Lipid Profile:  No results for input(s): CHOL, HDL, LDLCALC, TRIG, CHOLHDL, LDLDIRECT in the last 72 hours. Thyroid Function Tests: No results for input(s): TSH, T4TOTAL, FREET4, T3FREE, THYROIDAB in the last 72 hours. Anemia Panel: Recent Labs    02/11/22 0333 02/12/22 0221  FERRITIN 385* 381*   Urine analysis:    Component Value Date/Time   COLORURINE YELLOW (A) 01/26/2022 0241   APPEARANCEUR CLEAR (A) 01/26/2022 0241   APPEARANCEUR Clear 09/20/2021 1449   LABSPEC 1.008 01/26/2022 0241   PHURINE 5.0 01/26/2022 0241   GLUCOSEU NEGATIVE 01/26/2022 0241   HGBUR NEGATIVE 01/26/2022 0241   BILIRUBINUR NEGATIVE 01/26/2022 0241   BILIRUBINUR Negative 09/20/2021 1449   KETONESUR NEGATIVE 01/26/2022 0241   PROTEINUR NEGATIVE 01/26/2022 0241   NITRITE NEGATIVE 01/26/2022 0241   LEUKOCYTESUR NEGATIVE 01/26/2022 0241   Sepsis Labs: _0 (procalcitonin:4,lacticidven:4)  ) Recent Results (from the past 240 hour(s))  Resp Panel by RT-PCR (Flu A&B, Covid) Nasopharyngeal Swab     Status: Abnormal    Collection Time: 02/09/22  2:59 PM   Specimen: Nasopharyngeal Swab; Nasopharyngeal(NP) swabs in vial transport medium  Result Value Ref Range Status   SARS Coronavirus 2 by RT PCR POSITIVE (A) NEGATIVE Final    Comment: (NOTE) SARS-CoV-2 target nucleic acids are DETECTED.  The SARS-CoV-2 RNA is generally detectable in upper respiratory specimens during the acute phase of infection. Positive results are indicative of the presence of the identified virus, but do not rule out bacterial infection or co-infection with other pathogens not detected by the test. Clinical correlation with patient history and other diagnostic information is necessary to determine patient infection status. The expected result is Negative.  Fact Sheet for Patients: EntrepreneurPulse.com.au  Fact Sheet for Healthcare Providers: IncredibleEmployment.be  This test is not yet approved or cleared by the [place] FDA and  has been authorized for detection and/or diagnosis of SARS-CoV-2 by FDA under an Emergency Use Authorization (EUA).  This EUA will remain in effect (meaning this test can be used) for the duration of  the COVID-19 declaration under Section 564(b)(1) of the A ct, 21 U.S.C. section 360bbb-3(b)(1), unless the authorization is terminated or revoked sooner.     Influenza A by PCR NEGATIVE NEGATIVE Final   Influenza B by PCR NEGATIVE NEGATIVE Final    Comment: (NOTE) The Xpert Xpress SARS-CoV-2/FLU/RSV plus assay is intended as an aid in the diagnosis of influenza from Nasopharyngeal swab specimens and should not be used as a sole basis for treatment. Nasal washings and aspirates are unacceptable for Xpert Xpress SARS-CoV-2/FLU/RSV testing.  Fact Sheet for Patients: EntrepreneurPulse.com.au  Fact Sheet for Healthcare Providers: IncredibleEmployment.be  This test is not yet approved or cleared by the [place] FDA  and has been authorized for detection and/or diagnosis of SARS-CoV-2 by FDA under an Emergency Use Authorization (EUA). This EUA will remain in effect (meaning this test can be used) for the duration of the COVID-19 declaration under Section 564(b)(1) of the Act, 21 U.S.C. section 360bbb-3(b)(1), unless the authorization is terminated or revoked.  Performed at [facility], [place] [address], [place], [place] [address]      Radiology Studies: CT CHEST WO CONTRAST  Result Date: 02/11/2022 CLINICAL DATA:  Dyspnea, chronic, unclear etiology EXAM: CT CHEST WITHOUT CONTRAST TECHNIQUE: Multidetector CT imaging of the chest was performed following the standard protocol without IV contrast. RADIATION DOSE REDUCTION: This exam was performed according to the departmental dose-optimization program which includes automated exposure control, adjustment of the mA and/or kV according to  patient size and/or use of iterative reconstruction technique. COMPARISON:  July 13, 2021. FINDINGS: Cardiovascular: Cardiomegaly. Dense calcifications of the aortic valve. Severe atherosclerotic calcifications of the aorta. Predominately LEFT-sided coronary artery atherosclerotic calcifications. No pericardial effusion. Enlargement of the main pulmonary artery in relation to the ascending thoracic aorta likely reflects underlying pulmonary arterial hypertension. Mediastinum/Nodes: Similar appearance of mediastinal adenopathy. This is similar comparison to prior with representative pretracheal lymph node measuring 25 mm in the short axis, unchanged (series 4, image 52). Coarsely calcified mediastinal lymph nodes. No axillary adenopathy. Lungs/Pleura: No pleural effusion or pneumothorax. Revisualization of subpleural reticulation, traction bronchiectasis, architectural distortion and widespread areas of ground-glass attenuation. There is a favored upper lobe predominant distribution. There is superimposed centrilobular and likely  paraseptal emphysema. There are scattered more focal nodular opacities, stable in comparison to prior. Representative 7 mm pulmonary nodule of the RIGHT upper lobe is stable in comparison to prior (series 8, image 76). Representative RIGHT lower lobe subpleural nodule opacity is stable in comparison to prior (series 4, image 83). Overall extent of pulmonary fibrosis is not significantly changed in comparison to most recent prior but appears overall progressed since 2020. Upper Abdomen: Cholelithiasis. Subcentimeter hypodense lesions are too small to accurately characterize. No acute abnormality visualized. Musculoskeletal: Possible 9 mm RIGHT upper outer breast mass (series 3, image 25). New mild wedging of T12 with mild retropulsion of the superior endplate into the spinal column. IMPRESSION: 1. Revisualization of pulmonary fibrosis, similar in comparison most recent prior but overall progressed since 2020. 2. Age-indeterminate wedging of T12 with mild retropulsion of the superior endplate into the spinal canal. Recommend correlation with point tenderness. 3. There is a possible 9 mm RIGHT upper outer breast mass. Recommend correlation with mammographic history. 4. Similar appearance of multiple pulmonary nodules. Recommend continued close attention on follow-up, due August 2023 5. Enlargement of the main pulmonary artery in relation to the ascending thoracic aorta likely reflects underlying pulmonary arterial hypertension. Aortic Atherosclerosis (ICD10-I70.0) and Emphysema (ICD10-J43.9). Electronically Signed   By: Valentino Saxon M.D.   On: 02/11/2022 14:24     Scheduled Meds:  apixaban  5 mg Oral BID   vitamin C  500 mg Oral Daily   buPROPion  150 mg Oral Daily   DULoxetine  30 mg Oral Daily   fluticasone furoate-vilanterol  1 puff Inhalation Daily   guaiFENesin  600 mg Oral BID   insulin aspart  0-15 Units Subcutaneous TID WC   insulin aspart  0-5 Units Subcutaneous QHS   insulin glargine-yfgn   10 Units Subcutaneous Daily   levothyroxine  75 mcg Oral QAC breakfast   metoprolol succinate  25 mg Oral Daily   molnupiravir EUA  4 capsule Oral BID   nicotine  14 mg Transdermal Daily   pantoprazole  40 mg Oral Daily   pravastatin  40 mg Oral q1800   sodium chloride flush  3 mL Intravenous Q12H   torsemide  40 mg Oral Daily   umeclidinium bromide  1 puff Inhalation Daily   zinc sulfate  220 mg Oral Daily   Continuous Infusions:  amiodarone 30 mg/hr (02/11/22 2242)     LOS: 2 days    Time spent: 29mn  PDomenic Polite MD [facility]   02/12/2022, 10:46 AM

## 2022-02-12 NOTE — Progress Notes (Addendum)
Progress Note  Patient Name: Elizabeth Melton Date of Encounter: 02/13/2022  [facility] Cardiologist: [name], MD   Subjective   States she is feeling better. Remains in NSR/sinus brady. Breathing stable.  HR 50-60s BP 110-130s  Inpatient Medications    Scheduled Meds:  amiodarone  400 mg Oral BID   apixaban  5 mg Oral BID   arformoterol  15 mcg Nebulization BID   vitamin C  500 mg Oral Daily   budesonide (PULMICORT) nebulizer solution  0.5 mg Nebulization BID   buPROPion  150 mg Oral Daily   DULoxetine  30 mg Oral Daily   guaiFENesin  1,200 mg Oral BID   insulin aspart  0-15 Units Subcutaneous TID WC   insulin aspart  0-5 Units Subcutaneous QHS   insulin glargine-yfgn  10 Units Subcutaneous Daily   levothyroxine  75 mcg Oral QAC breakfast   methylPREDNISolone (SOLU-MEDROL) injection  80 mg Intravenous Q24H   metoprolol succinate  25 mg Oral Daily   molnupiravir EUA  4 capsule Oral BID   nicotine  14 mg Transdermal Daily   pantoprazole  40 mg Oral Daily   pravastatin  40 mg Oral q1800   revefenacin  175 mcg Nebulization Daily   sodium chloride flush  3 mL Intravenous Q12H   torsemide  40 mg Oral Daily   zinc sulfate  220 mg Oral Daily   Continuous Infusions:   PRN Meds: acetaminophen **OR** acetaminophen, albuterol, hydrALAZINE, methocarbamol, ondansetron **OR** ondansetron (ZOFRAN) IV, traMADol   Vital Signs    Vitals:   02/13/22 0401 02/13/22 0402 02/13/22 0413 02/13/22 0605  BP:   122/75   Pulse: (!) 49  60   Resp: _0 Temp:   97.6 F (36.4 C)   TempSrc:      SpO2: 96%     Weight:    71.5 kg  Height:        Intake/Output Summary (Last 24 hours) at 02/13/2022 0621 Last data filed at 02/13/2022 9373 Gross per 24 hour  Intake 1276 ml  Output 900 ml  Net 376 ml   Last 3 Weights 02/13/2022 02/12/2022 02/11/2022  Weight (lbs) 157 lb 11.2 oz 157 lb 4.8 oz 156 lb 4.9 oz  Weight (kg) 71.532 kg 71.351 kg 70.9 kg      Telemetry     NSR/sinus brady- Personally Reviewed  ECG    No new tracing- Personally Reviewed   Labs    High Sensitivity Troponin:   Recent Labs  Lab 01/25/22 2356 01/26/22 0320  TROPONINIHS 29* 31*     Chemistry Recent Labs  Lab 02/09/22 1522 02/09/22 1546 02/10/22 0942 02/11/22 0333 02/12/22 0221 02/13/22 0359  NA  --    < > 139 135 134* 131*  K  --    < > 4.4 3.2* 4.1 4.5  CL  --    < > 103 102 102 99  CO2  --    < > _1 19*  GLUCOSE  --    < > 190* 140* 114* 177*  BUN  --    < > 25* 20 20 31*  CREATININE  --    < > 1.18* 1.06* 1.20* 1.40*  CALCIUM  --    < > 8.8* 8.3* 8.6* 8.5*  MG 2.1  --   --   --   --   --   PROT  --   --  6.2* 6.0*  --   --  ALBUMIN  --   --  3.1* 2.7*  --   --   AST  --   --  32 23  --   --   ALT  --   --  20 19  --   --   ALKPHOS  --   --  62 58  --   --   BILITOT  --   --  1.2 0.7  --   --   GFRNONAA  --    < > 47* 53* 46* 38*  ANIONGAP  --    < > _0 < > = values in this interval not displayed.    Lipids No results for input(s): CHOL, TRIG, HDL, LABVLDL, LDLCALC, CHOLHDL in the last 168 hours.  Hematology Recent Labs  Lab 02/11/22 0333 02/12/22 0221 02/13/22 0359  WBC 8.4 8.0 8.0  RBC 4.23 4.30 4.41  HGB 14.1 14.4 14.8  HCT 42.2 42.8 43.6  MCV 99.8 99.5 98.9  MCH 33.3 33.5 33.6  MCHC 33.4 33.6 33.9  RDW 17.3* 17.3* 16.8*  PLT 148* 156 134*   Thyroid No results for input(s): TSH, FREET4 in the last 168 hours.  BNPNo results for input(s): BNP, PROBNP in the last 168 hours.  DDimer No results for input(s): DDIMER in the last 168 hours.   EXAM GEN: Comfortable, NAD, HFNC in place Neck: No JVD Cardiac: RRR, 2/6 systolic murmur  Respiratory: Diminished bilaterally GI: Soft, nontender, non-distended  MS: No edema; No deformity. Neuro:  Nonfocal  Psych: Normal affect    Radiology    CT CHEST WO CONTRAST  Result Date: 02/11/2022 CLINICAL DATA:  Dyspnea, chronic, unclear etiology EXAM: CT CHEST WITHOUT CONTRAST  TECHNIQUE: Multidetector CT imaging of the chest was performed following the standard protocol without IV contrast. RADIATION DOSE REDUCTION: This exam was performed according to the departmental dose-optimization program which includes automated exposure control, adjustment of the mA and/or kV according to patient size and/or use of iterative reconstruction technique. COMPARISON:  July 13, 2021. FINDINGS: Cardiovascular: Cardiomegaly. Dense calcifications of the aortic valve. Severe atherosclerotic calcifications of the aorta. Predominately LEFT-sided coronary artery atherosclerotic calcifications. No pericardial effusion. Enlargement of the main pulmonary artery in relation to the ascending thoracic aorta likely reflects underlying pulmonary arterial hypertension. Mediastinum/Nodes: Similar appearance of mediastinal adenopathy. This is similar comparison to prior with representative pretracheal lymph node measuring 25 mm in the short axis, unchanged (series 4, image 52). Coarsely calcified mediastinal lymph nodes. No axillary adenopathy. Lungs/Pleura: No pleural effusion or pneumothorax. Revisualization of subpleural reticulation, traction bronchiectasis, architectural distortion and widespread areas of ground-glass attenuation. There is a favored upper lobe predominant distribution. There is superimposed centrilobular and likely paraseptal emphysema. There are scattered more focal nodular opacities, stable in comparison to prior. Representative 7 mm pulmonary nodule of the RIGHT upper lobe is stable in comparison to prior (series 8, image 76). Representative RIGHT lower lobe subpleural nodule opacity is stable in comparison to prior (series 4, image 83). Overall extent of pulmonary fibrosis is not significantly changed in comparison to most recent prior but appears overall progressed since 2020. Upper Abdomen: Cholelithiasis. Subcentimeter hypodense lesions are too small to accurately characterize. No acute  abnormality visualized. Musculoskeletal: Possible 9 mm RIGHT upper outer breast mass (series 3, image 25). New mild wedging of T12 with mild retropulsion of the superior endplate into the spinal column. IMPRESSION: 1. Revisualization of pulmonary fibrosis, similar in comparison most recent prior but overall progressed since 2020.  2. Age-indeterminate wedging of T12 with mild retropulsion of the superior endplate into the spinal canal. Recommend correlation with point tenderness. 3. There is a possible 9 mm RIGHT upper outer breast mass. Recommend correlation with mammographic history. 4. Similar appearance of multiple pulmonary nodules. Recommend continued close attention on follow-up, due August 2023 5. Enlargement of the main pulmonary artery in relation to the ascending thoracic aorta likely reflects underlying pulmonary arterial hypertension. Aortic Atherosclerosis (ICD10-I70.0) and Emphysema (ICD10-J43.9). Electronically Signed   By: Valentino Saxon M.D.   On: 02/11/2022 14:24    Cardiac Studies   TTE 11/04/21: IMPRESSIONS   1. Left ventricular ejection fraction, by estimation, is 60 to 65%. The  left ventricle has normal function. The left ventricle has no regional  wall motion abnormalities. Left ventricular diastolic parameters were  normal.   2. Right ventricular systolic function is mildly reduced. The right  ventricular size is mildly enlarged. There is severely elevated pulmonary  artery systolic pressure.   3. Left atrial size was mildly dilated.   4. Right atrial size was moderately dilated.   5. The pericardial effusion is posterior to the left ventricle.   6. The mitral valve is abnormal. Mild mitral valve regurgitation. No  evidence of mitral stenosis. Moderate mitral annular calcification.   7. Significant pulmonary HTN based on TR velocity . Tricuspid valve  regurgitation is moderate.   8. CW across AV not done Manual trace from AR tracing shows stable mean  gradient of 9  mmHg . The aortic valve is normal in structure. Aortic valve  regurgitation is mild. Mild aortic valve stenosis.   9. The inferior vena cava is dilated in size with <50% respiratory  variability, suggesting right atrial pressure of 15 mmHg.   [place] 01/06/22: Hemodynamics (mmHg) RA 5 RV 72/7 PA 68/27, mean 42 PCWP mean 9 Oxygen saturations: PA 62% AO 96% Cardiac Output (Fick) 3.38  Cardiac Index (Fick) 1.83 PVR 9.7 WU   CARDIAC CATH: 11/23/2021 1. Normal filling pressures.  2. Severe pulmonary arterial hypertension.  3. Low cardiac output (though oxygen saturation was also low) 4. Nonobstructive mild CAD.   Patient Profile     81 y.o. 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 Also notably COVID positive on admission.  Assessment & Plan    #Paroxysmal atrial fibrillation: Very limited medication options. Discussions held previously with EP and primary Cardiologist with the decision made to load with amiodarone despite known lung disease in order to try to maintain sinus rhythm.  -Continue amiodarone 400 mg twice a day for 1 week, then 200 mg twice a day for 1 week, and then 200 mg a day thereafter - At some point consider 100 mg of amiodarone, lowest dose possible to help maintain her normal rhythm. 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 RHC with severe pulmonary HTN with mPAP 4242m, PCWP 9. On chronic 4-6 L O2 at home that has been steadily increasing to 9-12L. Has been seen by palliative and remains full code with full scope  of medical care. Pulm following. -Management per Pulm and  primary -Palliative consulted on multiple admissions and patient remains full code with full scope of care  #Chronic diastolic heart failure EF 60-65%. Mainly with cor pulmonale with severe pulmonary HTN. Was diuresed with lasix IV now transitioned to torsemide PO. -Low torsemide to 53m daily given mild rise in Cr on 440m-Consider spiro/farxiga prior to discharge -Low Na diet -Monitor I/Os and daily weights  #COVID - On molnupiravir per primary team  #CKD IIIa: -Trend -Resumed torsemide as above   For questions or updates, please contact [facility] Please consult [email or web address] for contact info under        Signed, HeFreada BergeronMD  02/13/2022, 6:21 AM

## 2022-02-12 NOTE — Consult Note (Signed)
NAME:  Elizabeth Melton, MRN:  [id number], DOB:  [birth year], LOS: 2 ADMISSION DATE:  02/09/2022, CONSULTATION DATE:  02/12/22 REFERRING MD:  Broadus John - [facility], CHIEF COMPLAINT:  Acute on chronic hypoxia    History of Present Illness:  81 yo F PMH chronic hypoxic respiratory failure (uses 4-6L at baseline), IPF, chronic diastolic HF, pulmonary hypertension, Afib, submassive PE, HTN, prior tobacco use who presented to ED 3/2 with increased HR and hypoxia after being seen by home health nurse. In ED, had converted out of Afib RVR to NSR, after a 5 second pause. She was hypoxic in ED requiring NRB. Admitted to [facility].  Hospital course marked by recurrent Afib RVR for which cardiology has been consulted, as well as increasing O2 requirement. She has been diuresed per cardiology guidance, and a CT chest which did not show significant progression of fibrosis compared to most recent imaging -- O2 requirement remains elevated, requiring 10L HFNC 3/5  PCCM is consulted in this setting  Of note this is the patient's 3rd admission in 2023.  Palliative medicine saw her on 2/17 and the patient's family requested full scope of practice.   She tells me that she has had worsening cough and mucus production over the last few days.  Typically she will cough up a small amount of sputum in the mornings, but lately it has been more voluminous, darker, and bloodier than normal.  Also with increased dyspnea and wheezing.  Minimal leg swelling.   She is fully unvaccinated against COVID-19.   Pertinent  Medical History  IPF Chronic diastolic HF COPD Pulmonary hypertension  Submassive PE  DM2 Afib Hypothyroidism  CKD IIIA OSA  Significant Hospital Events: Including procedures, antibiotic start and stop dates in addition to other pertinent events   3/2 to ED with incr HR and hypoxia after [facility] visit. Admit to [facility]. Cards consulted. Metop dc 3/3 back in Afib -- started on IV amio. Restarted metop. Palli consult, full code   3/4 worse resp effort. Diuresed. Back in sinus 3/5 pulm consult. Started demadex  Interim History / Subjective:  As above  Objective   Blood pressure 110/61, pulse (!) 59, temperature 97.9 F (36.6 C), temperature source Oral, resp. rate 20, height 5' 6" (1.676 m), weight 71.4 kg, SpO2 (!) 85 %.        Intake/Output Summary (Last 24 hours) at 02/12/2022 1059 Last data filed at 02/12/2022 1048 Gross per 24 hour  Intake 355.88 ml  Output 150 ml  Net 205.88 ml   Filed Weights   02/10/22 1829 02/11/22 0416 02/12/22 0631  Weight: 71.1 kg 70.9 kg 71.4 kg    Examination: General:  Chronically ill appearing, resting comfortably in bed HENT: NCAT OP clear PULM: Wheezing in upper lobes B, normal effort CV: RRR, no mgr GI: BS+, soft, nontender MSK: normal bulk and tone Derm: minimal edema in legs Neuro: awake, alert, no distress, MAEW   Resolved Hospital Problem list     Assessment & Plan:   Acute on chronic hypoxemic respiratory failure IPF pHTN Diastolic HF COPD with exacerbation COVID -19 positive  OSA Unvaccinated against COVID 19  Discussion: I believe her acute on chronic hypoxemia is due to COVID-19 as she is coughing up more mucus, has a COPD exacerbation, and is fully unvaccinated.  This likely lead to the atrial fibrillation.  Based on imaging I don't think she has an ILD exacerbation or amiodarone toxicity.  Plan: Start solumedrol 74m IV q12h, change to prednisone this week  to treat for COPD exacerbation.  Change inhaled medicines to nebulized formulation for ease of use Increase guaifenesin to 1264m bid Flutter/incentive to continue Wean off O2 for O2 saturation > 88% Amiodarone per cardiology Would hold off on starting inhaled pulmonary vasodilators as of yet she has not been approved for this as an outpatient  Overall prognosis grim.  Palliative care note from 01/2022 reviewed, would consider engaging them again.    Best Practice (right click and  "Reselect all SmartList Selections" daily)   Per [facility]  Labs   CBC: Recent Labs  Lab 02/09/22 1441 02/09/22 1546 02/10/22 0336 02/11/22 0333 02/12/22 0221  WBC 14.1*  --  10.2 8.4 8.0  NEUTROABS 11.0*  --   --   --   --   HGB 15.8* 10.5* 13.9 14.1 14.4  HCT 47.2* 31.0* 43.5 42.2 42.8  MCV 100.0  --  102.4* 99.8 99.5  PLT 153  --  162 148* 1736   Basic Metabolic Panel: Recent Labs  Lab 02/09/22 1522 02/09/22 1546 02/10/22 0527 02/10/22 0942 02/11/22 0333 02/12/22 0221  NA  --  140 137 139 135 134*  K  --  4.3 4.2 4.4 3.2* 4.1  CL  --  111 105 103 102 102  CO2  --   --  _0 GLUCOSE  --  126* 168* 190* 140* 114*  BUN  --  55* 27* 25* 20 20  CREATININE  --  2.80* 1.22* 1.18* 1.06* 1.20*  CALCIUM  --   --  8.3* 8.8* 8.3* 8.6*  MG 2.1  --   --   --   --   --    GFR: Estimated Creatinine Clearance: 38.5 mL/min (A) (by C-G formula based on SCr of 1.2 mg/dL (H)). Recent Labs  Lab 02/09/22 1441 02/10/22 0336 02/11/22 0333 02/12/22 0221  WBC 14.1* 10.2 8.4 8.0    Liver Function Tests: Recent Labs  Lab 02/10/22 0942 02/11/22 0333  AST 32 23  ALT 20 19  ALKPHOS 62 58  BILITOT 1.2 0.7  PROT 6.2* 6.0*  ALBUMIN 3.1* 2.7*   No results for input(s): LIPASE, AMYLASE in the last 168 hours. No results for input(s): AMMONIA in the last 168 hours.  ABG    Component Value Date/Time   PHART 7.44 01/25/2022 2358   PCO2ART 38 01/25/2022 2358   PO2ART 70 (L) 01/25/2022 2358   HCO3 25.8 01/25/2022 2358   TCO2 21 (L) 02/09/2022 1546   ACIDBASEDEF 0.2 12/30/2021 1820   O2SAT 94.8 01/25/2022 2358     Coagulation Profile: No results for input(s): INR, PROTIME in the last 168 hours.  Cardiac Enzymes: No results for input(s): CKTOTAL, CKMB, CKMBINDEX, TROPONINI in the last 168 hours.  HbA1C: Hgb A1c MFr Bld  Date/Time Value Ref Range Status  11/05/2021 01:22 AM 7.6 (H) 4.8 - 5.6 % Final    Comment:    (NOTE)         Prediabetes: 5.7 - 6.4          Diabetes: >6.4         Glycemic control for adults with diabetes: <7.0   05/20/2021 04:55 AM 7.0 (H) 4.8 - 5.6 % Final    Comment:    (NOTE)         Prediabetes: 5.7 - 6.4         Diabetes: >6.4         Glycemic control for adults with diabetes: <7.0  CBG: Recent Labs  Lab 02/11/22 0620 02/11/22 1143 02/11/22 1655 02/11/22 2128 02/12/22 0627  GLUCAP 163* 160* 115* 178* 110*    Review of Systems:   Gen: Denies fever, chills, weight change, fatigue, night sweats HEENT: Denies blurred vision, double vision, hearing loss, tinnitus, sinus congestion, rhinorrhea, sore throat, neck stiffness, dysphagia PULM: per HPI CV: Denies chest pain, edema, orthopnea, paroxysmal nocturnal dyspnea, palpitations GI: Denies abdominal pain, nausea, vomiting, diarrhea, hematochezia, melena, constipation, change in bowel habits GU: Denies dysuria, hematuria, polyuria, oliguria, urethral discharge Endocrine: Denies hot or cold intolerance, polyuria, polyphagia or appetite change Derm: Denies rash, dry skin, scaling or peeling skin change Heme: Denies easy bruising, bleeding, bleeding gums Neuro: Denies headache, numbness, weakness, slurred speech, loss of memory or consciousness   Past Medical History:  She,  has a past medical history of Acute on chronic diastolic (congestive) heart failure ([place]) (05/19/2021), Acute respiratory failure with hypoxia ([facility]) (09/06/2020), Atrial fibrillation with RVR ([place]), Diabetes mellitus without complication ([place]), Hyperlipidemia, Hypertension, Hypothyroidism, IPF (idiopathic pulmonary fibrosis) ([place]), Pneumonia due to COVID-19 virus (01/16/2021), and Thrombocytopenia ([place]).   Surgical History:   Past Surgical History:  Procedure Laterality Date   RIGHT HEART CATH N/A 07/05/2021   Procedure: RIGHT HEART CATH;  Surgeon: [name], MD;  Location: [place] [facility];  Service: Cardiovascular;  Laterality: N/A;   RIGHT HEART CATH N/A 01/06/2022   Procedure:  RIGHT HEART CATH;  Surgeon: Larey Dresser, MD;  Location: [place];  Service: Cardiovascular;  Laterality: N/A;   RIGHT/LEFT HEART CATH AND CORONARY ANGIOGRAPHY N/A 11/23/2021   Procedure: RIGHT/LEFT HEART CATH AND CORONARY ANGIOGRAPHY;  Surgeon: Larey Dresser, MD;  Location: [facility];  Service: Cardiovascular;  Laterality: N/A;   TUBAL LIGATION       Social History:   reports that she has been smoking cigarettes. She started smoking about 63 years ago. She has a 60.00 pack-year smoking history. She quit smokeless tobacco use about 59 years ago. She reports current alcohol use. She reports that she does not use drugs.   Family History:  Her family history includes Diabetes in her mother; Hyperlipidemia in her sister; Hypertension in her mother and sister; Kidney disease in her mother; Other in her father.   Allergies Allergies  Allergen Reactions   Lisinopril Other (See Comments)    Other reaction(s): cough 09/20/17     Home Medications  Prior to Admission medications   Medication Sig Start Date End Date Taking? Authorizing Provider  acetaminophen (TYLENOL) 500 MG tablet Take 1,000 mg by mouth every 8 (eight) hours as needed for headache or mild pain (pain).   Yes [provider]  albuterol (VENTOLIN HFA) 108 (90 Base) MCG/ACT inhaler Inhale 2 puffs into the lungs every 6 (six) hours as needed for wheezing or shortness of breath. 02/08/22  Yes Brand Males, MD  apixaban (ELIQUIS) 5 MG TABS tablet Take 1 tablet (5 mg total) by mouth 2 (two) times daily. 11/24/21  Yes Larey Dresser, MD  b complex vitamins tablet Take 1 tablet by mouth daily.   Yes [provider]  bisacodyl (DULCOLAX) 10 MG suppository Place 10 mg rectally daily as needed for moderate constipation.   Yes [provider]  Budeson-Glycopyrrol-Formoterol (BREZTRI AEROSPHERE) 160-9-4.8 MCG/ACT AERO Inhale 2 puffs into the lungs in the morning and at bedtime. 01/25/22  Yes  Cobb, Karie Schwalbe, NP  diphenhydrAMINE (BENADRYL) 25 MG tablet Take 25 mg by mouth every 6 (six)  hours as needed for allergies.   Yes [provider]  DULoxetine (CYMBALTA) 30 MG capsule Take 1 capsule (30 mg total) by mouth daily. 01/14/22  Yes Domenic Polite, MD  guaiFENesin (MUCINEX) 600 MG 12 hr tablet Take 1 tablet (600 mg total) by mouth 2 (two) times daily. Patient taking differently: Take 600 mg by mouth 2 (two) times daily. As needed 01/13/22  Yes Domenic Polite, MD  insulin glargine (LANTUS) 100 UNIT/ML injection Inject 10 Units into the skin daily.   Yes [provider]  levothyroxine (SYNTHROID, LEVOTHROID) 75 MCG tablet Take 75 mcg by mouth daily before breakfast.    Yes [provider]  lidocaine (LIDODERM) 5 % Place 1 patch onto the skin at bedtime. Remove & Discard patch within 12 hours or as directed by MD 01/13/22  Yes Domenic Polite, MD  lovastatin (MEVACOR) 40 MG tablet Take 40 mg by mouth at bedtime.   Yes [provider]  methocarbamol 1000 MG TABS Take 1,000 mg by mouth every 8 (eight) hours as needed for muscle spasms. 01/13/22  Yes Domenic Polite, MD  metoprolol succinate (TOPROL-XL) 25 MG 24 hr tablet Take 0.5 tablets (12.5 mg total) by mouth daily. 01/28/21  Yes [name], MD  nicotine (NICODERM CQ - DOSED IN MG/24 HOURS) 14 mg/24hr patch Place 14 mg onto the skin daily.   Yes [provider]  OXYGEN Inhale 4 L into the lungs continuous.   Yes [provider]  pantoprazole (PROTONIX) 40 MG tablet Take 1 tablet (40 mg total) by mouth daily. 01/14/22  Yes Domenic Polite, MD  Potassium Chloride (KLOR-CON PO) Take 20 mEq by mouth daily.   Yes [provider]  senna-docusate (SENOKOT-S) 8.6-50 MG tablet Take 2 tablets by mouth at bedtime. Patient taking differently: Take 2 tablets by mouth at bedtime. As needed 01/13/22  Yes Domenic Polite, MD  torsemide (DEMADEX) 20 MG tablet Patient takes 2 tablets once daily.   Yes  [provider]  traMADol (ULTRAM) 50 MG tablet Take 1 tablet (50 mg total) by mouth every 6 (six) hours as needed for moderate pain. 01/13/22  Yes Domenic Polite, MD  Treprostinil (TYVASO DPI TITRATION KIT) 16 & 32 & 48 MCG POWD Inhale into the lungs. Inhale 1 breath 4 times per day   Yes [provider]  VITAMIN D PO Take 1,000 Units by mouth daily.   Yes [provider]  albuterol (PROVENTIL) (2.5 MG/3ML) 0.083% nebulizer solution Take 3 mLs (2.5 mg total) by nebulization every 4 (four) hours as needed for wheezing or shortness of breath. Patient not taking: Reported on 02/09/2022 01/13/22   Domenic Polite, MD  Blood Glucose Monitoring Suppl (TRUE METRIX METER) w/Device KIT 1 each by Other route in the morning and at bedtime. 12/25/20   [provider]  buPROPion (WELLBUTRIN XL) 150 MG 24 hr tablet Take 1 tablet (150 mg total) by mouth daily. Patient not taking: Reported on 02/09/2022 12/20/21   Larey Dresser, MD  Calcium Carb-Cholecalciferol (CALCIUM + D3) 600-200 MG-UNIT TABS Take 2 tablets by mouth daily. Patient not taking: Reported on 02/09/2022    [provider]  glucose blood test strip 1 each by Other route as needed for other (blood sugar).    [provider]  Insulin Pen Needle (PEN NEEDLES 29GX1/2") 29G X 12MM MISC For insulin injection 05/22/21   [name], MD  Omega-3 Fatty Acids (FISH OIL) 1000 MG CAPS Take 1,000 mg by mouth daily. Patient not  taking: Reported on 02/09/2022    [provider]  predniSONE (STERAPRED UNI-PAK 21 TAB) 10 MG (21) TBPK tablet Start 60 mg po daily, taper 10 mg daily until finish Patient not taking: Reported on 02/09/2022 01/28/22   Max Sane, MD  SMART SENSE THIN LANCETS 26G MISC 1 each by Does not apply route 2 (two) times daily.    [provider]     Critical care time: n/a     Roselie Awkward, MD [place] [facility] Pager: [phone] Cell: [phone] After 7:00 pm call Elink   [phone]

## 2022-02-12 NOTE — Assessment & Plan Note (Signed)
Incidentally noted on CT chest, will need mammogram as outpatient when stable ?

## 2022-02-12 NOTE — Progress Notes (Signed)
Progress Note  Patient Name: Elizabeth Melton Date of Encounter: 02/12/2022  [facility] Cardiologist: [name], MD   Subjective   has been maintaining sinus rhythm well with IV amiodarone.  Inpatient Medications    Scheduled Meds:  apixaban  5 mg Oral BID   vitamin C  500 mg Oral Daily   buPROPion  150 mg Oral Daily   DULoxetine  30 mg Oral Daily   fluticasone furoate-vilanterol  1 puff Inhalation Daily   guaiFENesin  600 mg Oral BID   insulin aspart  0-15 Units Subcutaneous TID WC   insulin aspart  0-5 Units Subcutaneous QHS   insulin glargine-yfgn  10 Units Subcutaneous Daily   levothyroxine  75 mcg Oral QAC breakfast   metoprolol succinate  25 mg Oral Daily   molnupiravir EUA  4 capsule Oral BID   nicotine  14 mg Transdermal Daily   pantoprazole  40 mg Oral Daily   pravastatin  40 mg Oral q1800   sodium chloride flush  3 mL Intravenous Q12H   umeclidinium bromide  1 puff Inhalation Daily   zinc sulfate  220 mg Oral Daily   Continuous Infusions:  amiodarone 30 mg/hr (02/11/22 2242)   PRN Meds: acetaminophen **OR** acetaminophen, albuterol, hydrALAZINE, methocarbamol, ondansetron **OR** ondansetron (ZOFRAN) IV, traMADol   Vital Signs    Vitals:   02/12/22 0631 02/12/22 0748 02/12/22 0822 02/12/22 0830  BP:  97/68 110/61   Pulse:  60 (!) 57 (!) 59  Resp:  (!) _0 Temp:  97.9 F (36.6 C)    TempSrc:  Oral    SpO2:  90% 90% (!) 85%  Weight: 71.4 kg     Height:        Intake/Output Summary (Last 24 hours) at 02/12/2022 0955 Last data filed at 02/12/2022 0600 Gross per 24 hour  Intake 295.88 ml  Output 150 ml  Net 145.88 ml   Last 3 Weights 02/12/2022 02/11/2022 02/10/2022  Weight (lbs) 157 lb 4.8 oz 156 lb 4.9 oz 156 lb 12 oz  Weight (kg) 71.351 kg 70.9 kg 71.1 kg      Telemetry    Sinus rhythm 60s, no further atrial fib- Personally Reviewed  ECG    No new- Personally Reviewed   Labs    High Sensitivity Troponin:   Recent  Labs  Lab 01/25/22 2356 01/26/22 0320  TROPONINIHS 29* 31*     Chemistry Recent Labs  Lab 02/09/22 1522 02/09/22 1546 02/10/22 0942 02/11/22 0333 02/12/22 0221  NA  --    < > 139 135 134*  K  --    < > 4.4 3.2* 4.1  CL  --    < > 103 102 102  CO2  --    < > _1 GLUCOSE  --    < > 190* 140* 114*  BUN  --    < > 25* 20 20  CREATININE  --    < > 1.18* 1.06* 1.20*  CALCIUM  --    < > 8.8* 8.3* 8.6*  MG 2.1  --   --   --   --   PROT  --   --  6.2* 6.0*  --   ALBUMIN  --   --  3.1* 2.7*  --   AST  --   --  32 23  --   ALT  --   --  20 19  --   ALKPHOS  --   --  62 58  --   BILITOT  --   --  1.2 0.7  --   GFRNONAA  --    < > 47* 53* 46*  ANIONGAP  --    < > _0 < > = values in this interval not displayed.    Lipids No results for input(s): CHOL, TRIG, HDL, LABVLDL, LDLCALC, CHOLHDL in the last 168 hours.  Hematology Recent Labs  Lab 02/10/22 0336 02/11/22 0333 02/12/22 0221  WBC 10.2 8.4 8.0  RBC 4.25 4.23 4.30  HGB 13.9 14.1 14.4  HCT 43.5 42.2 42.8  MCV 102.4* 99.8 99.5  MCH 32.7 33.3 33.5  MCHC 32.0 33.4 33.6  RDW 18.0* 17.3* 17.3*  PLT 162 148* 156   Thyroid No results for input(s): TSH, FREET4 in the last 168 hours.  BNPNo results for input(s): BNP, PROBNP in the last 168 hours.  DDimer No results for input(s): DDIMER in the last 168 hours.   Radiology    CT CHEST WO CONTRAST  Result Date: 02/11/2022 CLINICAL DATA:  Dyspnea, chronic, unclear etiology EXAM: CT CHEST WITHOUT CONTRAST TECHNIQUE: Multidetector CT imaging of the chest was performed following the standard protocol without IV contrast. RADIATION DOSE REDUCTION: This exam was performed according to the departmental dose-optimization program which includes automated exposure control, adjustment of the mA and/or kV according to patient size and/or use of iterative reconstruction technique. COMPARISON:  July 13, 2021. FINDINGS: Cardiovascular: Cardiomegaly. Dense calcifications of the aortic  valve. Severe atherosclerotic calcifications of the aorta. Predominately LEFT-sided coronary artery atherosclerotic calcifications. No pericardial effusion. Enlargement of the main pulmonary artery in relation to the ascending thoracic aorta likely reflects underlying pulmonary arterial hypertension. Mediastinum/Nodes: Similar appearance of mediastinal adenopathy. This is similar comparison to prior with representative pretracheal lymph node measuring 25 mm in the short axis, unchanged (series 4, image 52). Coarsely calcified mediastinal lymph nodes. No axillary adenopathy. Lungs/Pleura: No pleural effusion or pneumothorax. Revisualization of subpleural reticulation, traction bronchiectasis, architectural distortion and widespread areas of ground-glass attenuation. There is a favored upper lobe predominant distribution. There is superimposed centrilobular and likely paraseptal emphysema. There are scattered more focal nodular opacities, stable in comparison to prior. Representative 7 mm pulmonary nodule of the RIGHT upper lobe is stable in comparison to prior (series 8, image 76). Representative RIGHT lower lobe subpleural nodule opacity is stable in comparison to prior (series 4, image 83). Overall extent of pulmonary fibrosis is not significantly changed in comparison to most recent prior but appears overall progressed since 2020. Upper Abdomen: Cholelithiasis. Subcentimeter hypodense lesions are too small to accurately characterize. No acute abnormality visualized. Musculoskeletal: Possible 9 mm RIGHT upper outer breast mass (series 3, image 25). New mild wedging of T12 with mild retropulsion of the superior endplate into the spinal column. IMPRESSION: 1. Revisualization of pulmonary fibrosis, similar in comparison most recent prior but overall progressed since 2020. 2. Age-indeterminate wedging of T12 with mild retropulsion of the superior endplate into the spinal canal. Recommend correlation with point  tenderness. 3. There is a possible 9 mm RIGHT upper outer breast mass. Recommend correlation with mammographic history. 4. Similar appearance of multiple pulmonary nodules. Recommend continued close attention on follow-up, due August 2023 5. Enlargement of the main pulmonary artery in relation to the ascending thoracic aorta likely reflects underlying pulmonary arterial hypertension. Aortic Atherosclerosis (ICD10-I70.0) and Emphysema (ICD10-J43.9). Electronically Signed   By: Valentino Saxon M.D.   On: 02/11/2022 14:24    Cardiac Studies   Last  ECHO EF 11/22 EF 65%.   [place] 01/06/22: Hemodynamics (mmHg) RA 5 RV 72/7 PA 68/27, mean 42 PCWP mean 9 Oxygen saturations: PA 62% AO 96% Cardiac Output (Fick) 3.38  Cardiac Index (Fick) 1.83 PVR 9.7 WU   CARDIAC CATH: 11/23/2021 1. Normal filling pressures.  2. Severe pulmonary arterial hypertension.  3. Low cardiac output (though oxygen saturation was also low) 4. Nonobstructive mild CAD.   Patient Profile     81 y.o. female with A-fib RVR intolerant to amiodarone secondary to abnormal LFTs.  Postconversion pause of approximately 5 seconds noted.  COVID-positive.  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 - On Friday discussed with EP and her primary cardiologist.  Currently utilizing IV amiodarone for this task.  Obviously with her underlying lung condition this is suboptimal however options are extremely limited.  Try to successfully load and maintain normal rhythm.  -Lets continue with IV amiodarone again today for 24 hours and then tomorrow switch to oral 400 mg twice a day for 1 week then 200 mg twice a day for 1 week and then 200 mg a day thereafter.  At some point consider 100 mg of amiodarone, lowest dose possible to help maintain her normal rhythm. - She is on low-dose metoprolol 25 a day. -  She is also on Eliquis 5 mg twice a day for anticoagulation. -She has demonstrated 5-second postconversion pauses.  COVID - On molnupiravir per primary team  Interstitial lung disease/secondary pulmonary hypertension with cor pulmonale - Sees pulmonary as well.  Medications reviewed.  No changes on 8 L of oxygen at home  Chronic diastolic heart failure - She was effectively diuresed with IV Lasix 40 mg.  Currently off of diuretic.  She is on torsemide 40 mg daily at home.  I will go ahead and reinitiate.    For questions or updates, please contact [place] Please consult [email or web address] for contact info under        Signed, Candee Furbish, MD  02/12/2022, 9:55 AM

## 2022-02-13 DIAGNOSIS — I5032 Chronic diastolic (congestive) heart failure: Secondary | ICD-10-CM

## 2022-02-13 DIAGNOSIS — I10 Essential (primary) hypertension: Secondary | ICD-10-CM | POA: Diagnosis not present

## 2022-02-13 DIAGNOSIS — J84112 Idiopathic pulmonary fibrosis: Secondary | ICD-10-CM

## 2022-02-13 DIAGNOSIS — J441 Chronic obstructive pulmonary disease with (acute) exacerbation: Secondary | ICD-10-CM

## 2022-02-13 DIAGNOSIS — J9621 Acute and chronic respiratory failure with hypoxia: Secondary | ICD-10-CM | POA: Diagnosis not present

## 2022-02-13 DIAGNOSIS — I480 Paroxysmal atrial fibrillation: Secondary | ICD-10-CM | POA: Diagnosis not present

## 2022-02-13 DIAGNOSIS — Z7189 Other specified counseling: Secondary | ICD-10-CM | POA: Diagnosis not present

## 2022-02-13 DIAGNOSIS — I272 Pulmonary hypertension, unspecified: Secondary | ICD-10-CM | POA: Diagnosis not present

## 2022-02-13 LAB — BASIC METABOLIC PANEL
Anion gap: 13 (ref 5–15)
BUN: 31 mg/dL — ABNORMAL HIGH (ref 8–23)
CO2: 19 mmol/L — ABNORMAL LOW (ref 22–32)
Calcium: 8.5 mg/dL — ABNORMAL LOW (ref 8.9–10.3)
Chloride: 99 mmol/L (ref 98–111)
Creatinine, Ser: 1.4 mg/dL — ABNORMAL HIGH (ref 0.44–1.00)
GFR, Estimated: 38 mL/min — ABNORMAL LOW (ref >60)
Glucose, Bld: 177 mg/dL — ABNORMAL HIGH (ref 70–99)
Potassium: 4.5 mmol/L (ref 3.5–5.1)
Sodium: 131 mmol/L — ABNORMAL LOW (ref 135–145)

## 2022-02-13 LAB — FERRITIN: Ferritin: 485 ng/mL — ABNORMAL HIGH (ref 11–307)

## 2022-02-13 LAB — GLUCOSE, CAPILLARY
Glucose-Capillary: 154 mg/dL — ABNORMAL HIGH (ref 70–99)
Glucose-Capillary: 200 mg/dL — ABNORMAL HIGH (ref 70–99)
Glucose-Capillary: 285 mg/dL — ABNORMAL HIGH (ref 70–99)
Glucose-Capillary: 287 mg/dL — ABNORMAL HIGH (ref 70–99)

## 2022-02-13 LAB — CBC
HCT: 43.6 % (ref 36.0–46.0)
Hemoglobin: 14.8 g/dL (ref 12.0–15.0)
MCH: 33.6 pg (ref 26.0–34.0)
MCHC: 33.9 g/dL (ref 30.0–36.0)
MCV: 98.9 fL (ref 80.0–100.0)
Platelets: 134 10*3/uL — ABNORMAL LOW (ref 150–400)
RBC: 4.41 MIL/uL (ref 3.87–5.11)
RDW: 16.8 % — ABNORMAL HIGH (ref 11.5–15.5)
WBC: 8 10*3/uL (ref 4.0–10.5)
nRBC: 0.4 % — ABNORMAL HIGH (ref 0.0–0.2)

## 2022-02-13 LAB — C-REACTIVE PROTEIN: CRP: 9.2 mg/dL — ABNORMAL HIGH (ref <1.0)

## 2022-02-13 MED ORDER — TORSEMIDE 20 MG PO TABS
20.0000 mg | ORAL_TABLET | Freq: Every day | ORAL | Status: DC
  Administered 2022-02-14 – 2022-02-15 (×2): 20 mg via ORAL
  Filled 2022-02-13 (×2): qty 1

## 2022-02-13 NOTE — Care Management Important Message (Signed)
Important Message ? ?Patient Details  ?Name: Elizabeth Melton ?MRN: [id number] ?Date of Birth: Sep 25, 1941 ? ? ?Medicare Important Message Given:  Yes ? ? ? ? ?[name] ?02/13/2022, 9:10 AM ?

## 2022-02-13 NOTE — Progress Notes (Signed)
? ?NAME:  Elizabeth Melton, MRN:  [id number], DOB:  Feb 22, 1941, LOS: 3 ?ADMISSION DATE:  02/09/2022, CONSULTATION DATE:  02/12/22 ?REFERRING MD:  Broadus John - [facility], CHIEF COMPLAINT:  Acute on chronic hypoxia   ? ?History of Present Illness:  ?81 yo F PMH chronic hypoxic respiratory failure (uses 4-6L at baseline), IPF, chronic diastolic HF, pulmonary hypertension, Afib, submassive PE, HTN, prior tobacco use who presented to ED 3/2 with increased HR and hypoxia after being seen by home health nurse. In ED, had converted out of Afib RVR to NSR, after a 5 second pause. She was hypoxic in ED requiring NRB. Admitted to [facility].  ?Hospital course marked by recurrent Afib RVR for which cardiology has been consulted, as well as increasing O2 requirement. She has been diuresed per cardiology guidance, and a CT chest which did not show significant progression of fibrosis compared to most recent imaging -- O2 requirement remains elevated, requiring 10L HFNC 3/5 ? ?PCCM is consulted in this setting ? ?Of note this is the patient's 3rd admission in 2023.  Palliative medicine saw her on 2/17 and the patient's family requested full scope of practice.  ? ?She tells me that she has had worsening cough and mucus production over the last few days.  Typically she will cough up a small amount of sputum in the mornings, but lately it has been more voluminous, darker, and bloodier than normal.  Also with increased dyspnea and wheezing.  Minimal leg swelling.  ? ?She is fully unvaccinated against COVID-19.  ? ?Pertinent  Medical History  ?IPF ?Chronic diastolic HF ?COPD ?Pulmonary hypertension  ?Submassive PE  ?DM2 ?Afib ?Hypothyroidism  ?CKD IIIA ?OSA ? ?Significant Hospital Events: ?Including procedures, antibiotic start and stop dates in addition to other pertinent events   ?3/2 to ED with incr HR and hypoxia after [facility] visit. Admit to [facility]. Cards consulted. Metop dc ?3/3 back in Afib -- started on IV amio. Restarted metop. Palli consult, full code   ?3/4 worse resp effort. Diuresed. Back in sinus ?3/5 pulm consult. Started demadex ? ?Interim History / Subjective:  ?As above ? ?Objective   ?Blood pressure (!) 113/47, pulse 60, temperature 97.8 ?F (36.6 ?C), temperature source Oral, resp. rate 19, height _0  (1.676 m), weight 71.5 kg, SpO2 91 %. ?   ?   ? ?Intake/Output Summary (Last 24 hours) at 02/13/2022 1604 ?Last data filed at 02/13/2022 1250 ?Gross per 24 hour  ?Intake 1296 ml  ?Output 900 ml  ?Net 396 ml  ? ? ?Filed Weights  ? 02/11/22 0416 02/12/22 0631 02/13/22 0605  ?Weight: 70.9 kg 71.4 kg 71.5 kg  ? ? ?Examination: ?General:  Chronically ill appearing, resting comfortably in bed ?HENT: NCAT OP clear ?PULM: Wheezing in upper lobes B, normal effort ?CV: RRR, no mgr ?GI: BS+, soft, nontender ?MSK: normal bulk and tone ?Derm: minimal edema in legs ?Neuro: awake, alert, no distress, MAEW ? ? ?Resolved Hospital Problem list   ? ? ?Assessment & Plan:  ? ?Acute on chronic hypoxemic respiratory failure ?IPF ?pHTN ?Diastolic HF ?COPD with exacerbation ?COVID -19 positive  ?OSA ?Unvaccinated against COVID 19 ? ?Discussion: ?Most likely she is having COPD exacerbation secondary to COVID-19 infection. This likely lead to the atrial fibrillation.  Doubt ILD flare or amiodarone tox. ? ?Plan: ?Start solumedrol 105m IV q12h, change to prednisone this week to treat for COPD exacerbation.  ?Broavana, budesonide, yupelri ?Molnupirivir day 4  ?Guaifenesin to 12045mbid ?Flutter/incentive to continue ?Wean off O2 for  O2 saturation > 88%. Currently on 9L for sats 91. ?Amiodarone, anticoagulation, diuresis per cardiology ?Would hold off on starting inhaled pulmonary vasodilators as of yet she has not been approved for this as an outpatient ?Appreciate palliative evaluation ?On discharge continue Breztri 2 puffs twice daily for inhaled therapies. Stop Stiolto per pulmonary clinic note.  ? ?Georgann Housekeeper, AGACNP-BC ?[facility] ? ?See Amion for personal  pager ?PCCM on call pager [phone] until 7pm. ?Please call Elink 7p-7a. [phone] ? ?02/13/2022 4:51 PM ? ? ? ? ?

## 2022-02-13 NOTE — TOC Progression Note (Addendum)
Transition of Care (TOC) - Progression Note  ? ? ?Patient Details  ?Name: Elizabeth Melton ?MRN: [id number] ?Date of Birth: [birth year] ? ?Transition of Care (TOC) CM/SW Contact  ?Zenon Mayo, RN ?Phone Number: ?02/13/2022, 2:07 PM ? ?Clinical Narrative:    ?From home with daughter, afib with rvr, covid positive, on 9 liters HFNC, baseline is 6 liters. Palliative Care consulted, has poor prognosis, daughter is POA, she is active with [facility] for outpatient palliative services and would like to continue per palliative note. Pt to eval, plan to dc in 24 to 48 hrs. ? ? ?  ?  ? ?Expected Discharge Plan and Services ?  ?  ?  ?  ?  ?                ?  ?  ?  ?  ?  ?  ?  ?  ?  ?  ? ? ?Social Determinants of Health (SDOH) Interventions ?  ? ?Readmission Risk Interventions ?Readmission Risk Prevention Plan 09/10/2020  ?Post Dischage Appt Complete  ?Medication Screening Complete  ?Transportation Screening Complete  ?Some recent data might be hidden  ? ? ?

## 2022-02-13 NOTE — Progress Notes (Signed)
PROGRESS NOTE    Elizabeth Melton  RUE:[id number] DOB: 26-Sep-1941 DOA: 02/09/2022 PCP: Kathyrn Lass, MD  Narrative2 year old female with history of COPD, IPF, chronic respiratory failure with hypoxia on 5-6 L oxygen via nasal cannula at home, severe pulmonary hypertension, chronic diastolic CHF, diabetes mellitus type 2, hypertension, paroxysmal A. fib, history of pulmonary embolism on Eliquis, PAD presented to the ED as her home health nurse noticed that her oxygenation was lower than usual.  Patient reports using 7 to 8 L of oxygen for the last several weeks, following her prolonged hospitalization from 1/18-2/3 at [facility] she was admitted to [facility] from 2/16-2/18 treated for COPD exacerbation with steroids then. -In the ED she was noted to be in rapid A-fib, spontaneously converted, followed by posttermination pause of 5 seconds, seen by cardiology, metoprolol held -3/3 back in rapid A-fib, started on amiodarone gtt. -O2 needs higher than usual -3/5, pulmonary consulted, started on steroids for COPD exacerbation+/- Covid  Subjective: -Reports being tired, breathing a little bit better, some cough noted  Assessment & Plan:  Acute on chronic hypoxic respiratory failure Pulmonary fibrosis Acute on chronic diastolic CHF Severe pulmonary hypertension -Echo 11/22 noted EF of 60-65%, moderate TR, PASP of 85 mmHg -Seen by pulmonary and heart failure team during hospitalization in late January, right heart cath noted severe pulmonary hypertension, treated with steroids as well then -Clinically appears euvolemic, diuresed with IV Lasix earlier, transitioned to torsemide yesterday -Has SARS COVID-19 infection, chest x-ray and CT chest unchanged  -CT chest yesterday w/ chronic lung findings, chronic fibrosis -Appreciate pulmonary input, started on IV steroids for possible COPD flare -Seen by palliative care last 2 hospitalizations as well as this admission, she wishes for full code, full scope of  Rx -Increase activity, PT eval, attempt to wean O2 down to 6 L  * PAF (paroxysmal atrial fibrillation) ([place]) with RVR -3/2 had had posttermination pause of 5 seconds, metoprolol held briefly -then developed rapid A-fib this admission, uncontrolled on Cardizem gtt., options limited with lung disease, started on amiodarone as a temporizing measure, cardiology following -Now switched to p.o. amiodarone, not a good candidate for long-term amiodarone therapy however options are limited -Continue Eliquis, Toprol resumed  COVID-19 virus infection -No apparent symptoms other than her chronic pulmonary issues -Started on molnupiravir, day 4 -Chest x-ray with chronic fibrosis, CT chest does not suggest COVID-pneumonia -, CRP ferritin needs higher,'s started steroids yesterday, see discussion above   CKD 3a Creatinine stable, continue current dose of torsemide  Abnormal breast imaging CT chest noted 9 mm right upper quadrant breast mass, will need mammogram as outpatient   COPD with chronic bronchitis and emphysema ([place]) Idiopathic pulmonary fibrosis Chronic respiratory failure on 5 to 6 L O2 --Outpatient follow-up with pulmonary   Type 2 diabetes mellitus with hyperglycemia ([place])- a1c in 10/2021: 7.6 Continue lantus 10 units daily and moderate SSI   Essential (primary) hypertension- -Stable, meds as above   Hyperlipidemia/PAD-  Continue mevacor   Hypothyroidism-  Last TSH 05/2021 and wnl Continue home synthroid   OSA -CPAP QHS   Goals of care discussion: Patient has advanced ILD, chronic respiratory failure, COPD on 6 L home O2, recently has been using 7 to 8 L, also has severe PAH, cor pulmonale, diastolic CHF, CKD, A-fib RVR, her prognosis is poor, this has been discussed again with the patient at length today.  She was seen by palliative care in January and again in February , recommended consideration of DNR, she wishes to remain  full code with full scope of treatment for now, she  tells me that she wishes for [place] to attempt resuscitation and life support so her family can see her before she passes, when it gets to the point.  DVT prophylaxis: Apixaban Code Status: Full code Family Communication: Discussed patient in detail, daughter updated from patient's room yesterday Disposition Plan: Home pending improvement in hypoxia  consultants: Cardiology, pulmonary  Procedures:   Antimicrobials:    Objective: Vitals:   02/13/22 0605 02/13/22 0803 02/13/22 0842 02/13/22 0955  BP:  112/71  (!) 113/47  Pulse:  (!) 57  60  Resp:  20  19  Temp:    97.8 F (36.6 C)  TempSrc:    Oral  SpO2:  93% 94% 91%  Weight: 71.5 kg     Height:        Intake/Output Summary (Last 24 hours) at 02/13/2022 1120 Last data filed at 02/13/2022 7711 Gross per 24 hour  Intake 736 ml  Output 900 ml  Net -164 ml   Filed Weights   02/11/22 0416 02/12/22 0631 02/13/22 0605  Weight: 70.9 kg 71.4 kg 71.5 kg    Examination:  General exam: Chronically ill pleasant female sitting up in bed, AAOx3, no distress HEENT: No JVD CVS: S1-S2, regular rhythm Lungs: Bilateral rales, unchanged Abdomen: Soft, nontender, bowel sounds present Extremities: No edema  Skin: No rashes Psychiatry: Judgement and insight appear normal. Mood & affect appropriate.     Data Reviewed:   CBC: Recent Labs  Lab 02/09/22 1441 02/09/22 1546 02/10/22 0336 02/11/22 0333 02/12/22 0221 02/13/22 0359  WBC 14.1*  --  10.2 8.4 8.0 8.0  NEUTROABS 11.0*  --   --   --   --   --   HGB 15.8* 10.5* 13.9 14.1 14.4 14.8  HCT 47.2* 31.0* 43.5 42.2 42.8 43.6  MCV 100.0  --  102.4* 99.8 99.5 98.9  PLT 153  --  162 148* 156 657*   Basic Metabolic Panel: Recent Labs  Lab 02/09/22 1522 02/09/22 1546 02/10/22 0527 02/10/22 0942 02/11/22 0333 02/12/22 0221 02/13/22 0359  NA  --    < > 137 139 135 134* 131*  K  --    < > 4.2 4.4 3.2* 4.1 4.5  CL  --    < > 105 103 102 102 99  CO2  --   --  _0 19*   GLUCOSE  --    < > 168* 190* 140* 114* 177*  BUN  --    < > 27* 25* 20 20 31*  CREATININE  --    < > 1.22* 1.18* 1.06* 1.20* 1.40*  CALCIUM  --   --  8.3* 8.8* 8.3* 8.6* 8.5*  MG 2.1  --   --   --   --   --   --    < > = values in this interval not displayed.   GFR: Estimated Creatinine Clearance: 33 mL/min (A) (by C-G formula based on SCr of 1.4 mg/dL (H)). Liver Function Tests: Recent Labs  Lab 02/10/22 0942 02/11/22 0333  AST 32 23  ALT 20 19  ALKPHOS 62 58  BILITOT 1.2 0.7  PROT 6.2* 6.0*  ALBUMIN 3.1* 2.7*   No results for input(s): LIPASE, AMYLASE in the last 168 hours. No results for input(s): AMMONIA in the last 168 hours. Coagulation Profile: No results for input(s): INR, PROTIME in the last 168 hours. Cardiac Enzymes: No results for input(s):  CKTOTAL, CKMB, CKMBINDEX, TROPONINI in the last 168 hours. BNP (last 3 results) No results for input(s): PROBNP in the last 8760 hours. HbA1C: No results for input(s): HGBA1C in the last 72 hours. CBG: Recent Labs  Lab 02/12/22 0627 02/12/22 1203 02/12/22 1647 02/12/22 2133 02/13/22 0559  GLUCAP 110* 212* 90 230* 154*   Lipid Profile: No results for input(s): CHOL, HDL, LDLCALC, TRIG, CHOLHDL, LDLDIRECT in the last 72 hours. Thyroid Function Tests: No results for input(s): TSH, T4TOTAL, FREET4, T3FREE, THYROIDAB in the last 72 hours. Anemia Panel: Recent Labs    02/12/22 0221 02/13/22 0359  FERRITIN 381* 485*   Urine analysis:    Component Value Date/Time   COLORURINE YELLOW (A) 01/26/2022 0241   APPEARANCEUR CLEAR (A) 01/26/2022 0241   APPEARANCEUR Clear 09/20/2021 1449   LABSPEC 1.008 01/26/2022 0241   PHURINE 5.0 01/26/2022 0241   GLUCOSEU NEGATIVE 01/26/2022 0241   HGBUR NEGATIVE 01/26/2022 0241   BILIRUBINUR NEGATIVE 01/26/2022 0241   BILIRUBINUR Negative 09/20/2021 1449   KETONESUR NEGATIVE 01/26/2022 0241   PROTEINUR NEGATIVE 01/26/2022 0241   NITRITE NEGATIVE 01/26/2022 0241   LEUKOCYTESUR  NEGATIVE 01/26/2022 0241   Sepsis Labs: _0 (procalcitonin:4,lacticidven:4)  ) Recent Results (from the past 240 hour(s))  Resp Panel by RT-PCR (Flu A&B, Covid) Nasopharyngeal Swab     Status: Abnormal   Collection Time: 02/09/22  2:59 PM   Specimen: Nasopharyngeal Swab; Nasopharyngeal(NP) swabs in vial transport medium  Result Value Ref Range Status   SARS Coronavirus 2 by RT PCR POSITIVE (A) NEGATIVE Final    Comment: (NOTE) SARS-CoV-2 target nucleic acids are DETECTED.  The SARS-CoV-2 RNA is generally detectable in upper respiratory specimens during the acute phase of infection. Positive results are indicative of the presence of the identified virus, but do not rule out bacterial infection or co-infection with other pathogens not detected by the test. Clinical correlation with patient history and other diagnostic information is necessary to determine patient infection status. The expected result is Negative.  Fact Sheet for Patients: EntrepreneurPulse.com.au  Fact Sheet for Healthcare Providers: IncredibleEmployment.be  This test is not yet approved or cleared by the [place] FDA and  has been authorized for detection and/or diagnosis of SARS-CoV-2 by FDA under an Emergency Use Authorization (EUA).  This EUA will remain in effect (meaning this test can be used) for the duration of  the COVID-19 declaration under Section 564(b)(1) of the A ct, 21 U.S.C. section 360bbb-3(b)(1), unless the authorization is terminated or revoked sooner.     Influenza A by PCR NEGATIVE NEGATIVE Final   Influenza B by PCR NEGATIVE NEGATIVE Final    Comment: (NOTE) The Xpert Xpress SARS-CoV-2/FLU/RSV plus assay is intended as an aid in the diagnosis of influenza from Nasopharyngeal swab specimens and should not be used as a sole basis for treatment. Nasal washings and aspirates are unacceptable for Xpert Xpress SARS-CoV-2/FLU/RSV testing.  Fact  Sheet for Patients: EntrepreneurPulse.com.au  Fact Sheet for Healthcare Providers: IncredibleEmployment.be  This test is not yet approved or cleared by the [place] FDA and has been authorized for detection and/or diagnosis of SARS-CoV-2 by FDA under an Emergency Use Authorization (EUA). This EUA will remain in effect (meaning this test can be used) for the duration of the COVID-19 declaration under Section 564(b)(1) of the Act, 21 U.S.C. section 360bbb-3(b)(1), unless the authorization is terminated or revoked.  Performed at [facility], [place] [address], [place], [place] [address]      Radiology Studies: CT CHEST  WO CONTRAST  Result Date: 02/11/2022 CLINICAL DATA:  Dyspnea, chronic, unclear etiology EXAM: CT CHEST WITHOUT CONTRAST TECHNIQUE: Multidetector CT imaging of the chest was performed following the standard protocol without IV contrast. RADIATION DOSE REDUCTION: This exam was performed according to the departmental dose-optimization program which includes automated exposure control, adjustment of the mA and/or kV according to patient size and/or use of iterative reconstruction technique. COMPARISON:  July 13, 2021. FINDINGS: Cardiovascular: Cardiomegaly. Dense calcifications of the aortic valve. Severe atherosclerotic calcifications of the aorta. Predominately LEFT-sided coronary artery atherosclerotic calcifications. No pericardial effusion. Enlargement of the main pulmonary artery in relation to the ascending thoracic aorta likely reflects underlying pulmonary arterial hypertension. Mediastinum/Nodes: Similar appearance of mediastinal adenopathy. This is similar comparison to prior with representative pretracheal lymph node measuring 25 mm in the short axis, unchanged (series 4, image 52). Coarsely calcified mediastinal lymph nodes. No axillary adenopathy. Lungs/Pleura: No pleural effusion or pneumothorax. Revisualization of subpleural  reticulation, traction bronchiectasis, architectural distortion and widespread areas of ground-glass attenuation. There is a favored upper lobe predominant distribution. There is superimposed centrilobular and likely paraseptal emphysema. There are scattered more focal nodular opacities, stable in comparison to prior. Representative 7 mm pulmonary nodule of the RIGHT upper lobe is stable in comparison to prior (series 8, image 76). Representative RIGHT lower lobe subpleural nodule opacity is stable in comparison to prior (series 4, image 83). Overall extent of pulmonary fibrosis is not significantly changed in comparison to most recent prior but appears overall progressed since 2020. Upper Abdomen: Cholelithiasis. Subcentimeter hypodense lesions are too small to accurately characterize. No acute abnormality visualized. Musculoskeletal: Possible 9 mm RIGHT upper outer breast mass (series 3, image 25). New mild wedging of T12 with mild retropulsion of the superior endplate into the spinal column. IMPRESSION: 1. Revisualization of pulmonary fibrosis, similar in comparison most recent prior but overall progressed since 2020. 2. Age-indeterminate wedging of T12 with mild retropulsion of the superior endplate into the spinal canal. Recommend correlation with point tenderness. 3. There is a possible 9 mm RIGHT upper outer breast mass. Recommend correlation with mammographic history. 4. Similar appearance of multiple pulmonary nodules. Recommend continued close attention on follow-up, due August 2023 5. Enlargement of the main pulmonary artery in relation to the ascending thoracic aorta likely reflects underlying pulmonary arterial hypertension. Aortic Atherosclerosis (ICD10-I70.0) and Emphysema (ICD10-J43.9). Electronically Signed   By: Valentino Saxon M.D.   On: 02/11/2022 14:24     Scheduled Meds:  amiodarone  400 mg Oral BID   apixaban  5 mg Oral BID   arformoterol  15 mcg Nebulization BID   vitamin C  500 mg  Oral Daily   budesonide (PULMICORT) nebulizer solution  0.5 mg Nebulization BID   buPROPion  150 mg Oral Daily   DULoxetine  30 mg Oral Daily   guaiFENesin  1,200 mg Oral BID   insulin aspart  0-15 Units Subcutaneous TID WC   insulin aspart  0-5 Units Subcutaneous QHS   insulin glargine-yfgn  10 Units Subcutaneous Daily   levothyroxine  75 mcg Oral QAC breakfast   methylPREDNISolone (SOLU-MEDROL) injection  80 mg Intravenous Q24H   metoprolol succinate  25 mg Oral Daily   molnupiravir EUA  4 capsule Oral BID   nicotine  14 mg Transdermal Daily   pantoprazole  40 mg Oral Daily   pravastatin  40 mg Oral q1800   revefenacin  175 mcg Nebulization Daily   sodium chloride flush  3 mL Intravenous Q12H   [START  ON 02/14/2022] torsemide  20 mg Oral Daily   zinc sulfate  220 mg Oral Daily   Continuous Infusions:     LOS: 3 days    Time spent: 65mn  PDomenic Polite MD [facility]   02/13/2022, 11:20 AM

## 2022-02-14 ENCOUNTER — Encounter (HOSPITAL_COMMUNITY): Payer: Self-pay | Admitting: Internal Medicine

## 2022-02-14 ENCOUNTER — Inpatient Hospital Stay: Admit: 2022-02-14 | Payer: No Typology Code available for payment source

## 2022-02-14 DIAGNOSIS — Z7189 Other specified counseling: Secondary | ICD-10-CM | POA: Diagnosis not present

## 2022-02-14 DIAGNOSIS — J9621 Acute and chronic respiratory failure with hypoxia: Secondary | ICD-10-CM | POA: Diagnosis not present

## 2022-02-14 DIAGNOSIS — J441 Chronic obstructive pulmonary disease with (acute) exacerbation: Secondary | ICD-10-CM | POA: Diagnosis not present

## 2022-02-14 DIAGNOSIS — I480 Paroxysmal atrial fibrillation: Secondary | ICD-10-CM | POA: Diagnosis not present

## 2022-02-14 DIAGNOSIS — J84112 Idiopathic pulmonary fibrosis: Secondary | ICD-10-CM | POA: Diagnosis not present

## 2022-02-14 LAB — GLUCOSE, CAPILLARY
Glucose-Capillary: 194 mg/dL — ABNORMAL HIGH (ref 70–99)
Glucose-Capillary: 304 mg/dL — ABNORMAL HIGH (ref 70–99)
Glucose-Capillary: 145 mg/dL — ABNORMAL HIGH (ref 70–99)

## 2022-02-14 LAB — BASIC METABOLIC PANEL
Anion gap: 11 (ref 5–15)
BUN: 50 mg/dL — ABNORMAL HIGH (ref 8–23)
CO2: 23 mmol/L (ref 22–32)
Calcium: 9.1 mg/dL (ref 8.9–10.3)
Chloride: 99 mmol/L (ref 98–111)
Creatinine, Ser: 1.65 mg/dL — ABNORMAL HIGH (ref 0.44–1.00)
GFR, Estimated: 31 mL/min — ABNORMAL LOW (ref >60)
Glucose, Bld: 198 mg/dL — ABNORMAL HIGH (ref 70–99)
Potassium: 4.8 mmol/L (ref 3.5–5.1)
Sodium: 133 mmol/L — ABNORMAL LOW (ref 135–145)

## 2022-02-14 LAB — C-REACTIVE PROTEIN: CRP: 5.1 mg/dL — ABNORMAL HIGH (ref <1.0)

## 2022-02-14 LAB — FERRITIN: Ferritin: 498 ng/mL — ABNORMAL HIGH (ref 11–307)

## 2022-02-14 MED ORDER — INSULIN ASPART 100 UNIT/ML IJ SOLN
3.0000 [IU] | Freq: Three times a day (TID) | INTRAMUSCULAR | Status: DC
  Administered 2022-02-14 – 2022-02-21 (×21): 3 [IU] via SUBCUTANEOUS

## 2022-02-14 MED ORDER — METHYLPREDNISOLONE SODIUM SUCC 125 MG IJ SOLR
60.0000 mg | Freq: Every day | INTRAMUSCULAR | Status: DC
  Administered 2022-02-14 – 2022-02-15 (×2): 60 mg via INTRAVENOUS
  Filled 2022-02-14 (×2): qty 2

## 2022-02-14 NOTE — Progress Notes (Signed)
This chaplain responded to PMT consult for spiritual care.  ? ?At this time, the Pt. is mobile from the recliner to the bedside commode. The chaplain updated the RN-Denise on the visit. The chaplain understands the Pt. is practicing "patience" as one of her goals as she anticipates d/c home. ? ?Chaplain Sallyanne Kuster ?[phone] ?

## 2022-02-14 NOTE — Progress Notes (Addendum)
? ?Progress Note ? ?Patient Name: Elizabeth Melton ?Date of Encounter: 02/14/2022 ? ?[facility] Cardiologist: [name], MD  ? ?Subjective  ? ?Feeling better. Breathing improving. No chest pain. ? ?Inpatient Medications  ?  ?Scheduled Meds: ? amiodarone  400 mg Oral BID  ? apixaban  5 mg Oral BID  ? arformoterol  15 mcg Nebulization BID  ? vitamin C  500 mg Oral Daily  ? budesonide (PULMICORT) nebulizer solution  0.5 mg Nebulization BID  ? buPROPion  150 mg Oral Daily  ? DULoxetine  30 mg Oral Daily  ? guaiFENesin  1,200 mg Oral BID  ? insulin aspart  0-15 Units Subcutaneous TID WC  ? insulin aspart  0-5 Units Subcutaneous QHS  ? insulin glargine-yfgn  10 Units Subcutaneous Daily  ? levothyroxine  75 mcg Oral QAC breakfast  ? metoprolol succinate  25 mg Oral Daily  ? molnupiravir EUA  4 capsule Oral BID  ? nicotine  14 mg Transdermal Daily  ? pantoprazole  40 mg Oral Daily  ? pravastatin  40 mg Oral q1800  ? revefenacin  175 mcg Nebulization Daily  ? sodium chloride flush  3 mL Intravenous Q12H  ? torsemide  20 mg Oral Daily  ? zinc sulfate  220 mg Oral Daily  ? ?Continuous Infusions: ? ? ?PRN Meds: ?acetaminophen **OR** acetaminophen, albuterol, hydrALAZINE, methocarbamol, ondansetron **OR** ondansetron (ZOFRAN) IV, traMADol  ? ?Vital Signs  ?  ?Vitals:  ? 02/13/22 2118 02/14/22 0400 02/14/22 0738 02/14/22 0810  ?BP:  (!) 143/59  (!) 120/50  ?Pulse: (!) 54 (!) 52  (!) 52  ?Resp: _0 ?Temp:  97.6 ?F (36.4 ?C)  97.6 ?F (36.4 ?C)  ?TempSrc:  Oral  Oral  ?SpO2: 92% 96% 92% 100%  ?Weight:  73.1 kg    ?Height:      ? ? ?Intake/Output Summary (Last 24 hours) at 02/14/2022 0842 ?Last data filed at 02/14/2022 0522 ?Gross per 24 hour  ?Intake 560 ml  ?Output 700 ml  ?Net -140 ml  ? ? ?Last 3 Weights 02/14/2022 02/13/2022 02/12/2022  ?Weight (lbs) 161 lb 2.5 oz 157 lb 11.2 oz 157 lb 4.8 oz  ?Weight (kg) 73.1 kg 71.532 kg 71.351 kg  ?   ? ?Telemetry  ?  ?NSR/sine brady- Personally Reviewed ? ?ECG  ?  ?No new  tracing- Personally Reviewed ? ? ?Labs  ?  ?High Sensitivity Troponin:   ?Recent Labs  ?Lab 01/25/22 ?2356 01/26/22 ?0320  ?TROPONINIHS 29* 31*  ? ?   ?Chemistry ?Recent Labs  ?Lab 02/09/22 ?1522 02/09/22 ?1546 02/10/22 ?0109 02/11/22 ?3235 02/12/22 ?0221 02/13/22 ?5732 02/14/22 ?2025  ?NA  --    < > 139 135 134* 131* 133*  ?K  --    < > 4.4 3.2* 4.1 4.5 4.8  ?CL  --    < > 103 102 102 99 99  ?CO2  --    < > _1 19* 23  ?GLUCOSE  --    < > 190* 140* 114* 177* 198*  ?BUN  --    < > 25* 20 20 31* 50*  ?CREATININE  --    < > 1.18* 1.06* 1.20* 1.40* 1.65*  ?CALCIUM  --    < > 8.8* 8.3* 8.6* 8.5* 9.1  ?MG 2.1  --   --   --   --   --   --   ?PROT  --   --  6.2* 6.0*  --   --   --   ?  ALBUMIN  --   --  3.1* 2.7*  --   --   --   ?AST  --   --  32 23  --   --   --   ?ALT  --   --  20 19  --   --   --   ?ALKPHOS  --   --  58 58  --   --   --   ?BILITOT  --   --  1.2 0.7  --   --   --   ?GFRNONAA  --    < > 47* 53* 46* 38* 31*  ?ANIONGAP  --    < > _0 ? < > = values in this interval not displayed.  ? ?  ?Lipids No results for input(s): CHOL, TRIG, HDL, LABVLDL, LDLCALC, CHOLHDL in the last 168 hours.  ?Hematology ?Recent Labs  ?Lab 02/11/22 ?1594 02/12/22 ?0221 02/13/22 ?0359  ?WBC 8.4 8.0 8.0  ?RBC 4.23 4.30 4.41  ?HGB 14.1 14.4 14.8  ?HCT 42.2 42.8 43.6  ?MCV 99.8 99.5 98.9  ?MCH 33.3 33.5 33.6  ?MCHC 33.4 33.6 33.9  ?RDW 17.3* 17.3* 16.8*  ?PLT 148* 156 134*  ? ? ?Thyroid No results for input(s): TSH, FREET4 in the last 168 hours.  ?BNPNo results for input(s): BNP, PROBNP in the last 168 hours.  ?DDimer No results for input(s): DDIMER in the last 168 hours.  ? ?EXAM ?GEN: Elderly female, comfortable ?Neck: No JVD ?Cardiac: RRR, 2/6 systolic murmur  ?Respiratory: Diminished ?GI: Soft, nontender, non-distended  ?MS: No edema; No deformity. Warm ?Neuro:  Nonfocal  ?Psych: Normal affect   ? ?Radiology  ?  ?No results found. ? ?Cardiac Studies  ? ?TTE 11/04/21: ?IMPRESSIONS  ? 1. Left ventricular ejection fraction,  by estimation, is 60 to 65%. The  ?left ventricle has normal function. The left ventricle has no regional  ?wall motion abnormalities. Left ventricular diastolic parameters were  ?normal.  ? 2. Right ventricular systolic function is mildly reduced. The right  ?ventricular size is mildly enlarged. There is severely elevated pulmonary  ?artery systolic pressure.  ? 3. Left atrial size was mildly dilated.  ? 4. Right atrial size was moderately dilated.  ? 5. The pericardial effusion is posterior to the left ventricle.  ? 6. The mitral valve is abnormal. Mild mitral valve regurgitation. No  ?evidence of mitral stenosis. Moderate mitral annular calcification.  ? 7. Significant pulmonary HTN based on TR velocity . Tricuspid valve  ?regurgitation is moderate.  ? 8. CW across AV not done Manual trace from AR tracing shows stable mean  ?gradient of 9 mmHg . The aortic valve is normal in structure. Aortic valve  ?regurgitation is mild. Mild aortic valve stenosis.  ? 9. The inferior vena cava is dilated in size with <50% respiratory  ?variability, suggesting right atrial pressure of 15 mmHg. ?  ?[place] 01/06/22: ?Hemodynamics (mmHg) ?RA 5 ?RV 72/7 ?PA 68/27, mean 42 ?PCWP mean 9 ?Oxygen saturations: ?PA 62% ?AO 96% ?Cardiac Output (Fick) 3.38  ?Cardiac Index (Fick) 1.83 ?PVR 9.7 WU ?  ?CARDIAC CATH: 11/23/2021 ?1. Normal filling pressures.  ?2. Severe pulmonary arterial hypertension.  ?3. Low cardiac output (though oxygen saturation was also low) ?4. Nonobstructive mild CAD.  ? ?Patient Profile  ?   ?81 y.o. female with history of chronic hypoxic respiratory failure on 4-6L at home, IPD, chronic diastolic HF, Afib, history of submassive PE, HTN and prior tobacco use  who presented to the ER with worsening hypoxia and Afib with RVR with 5 second post-conversion pause for which Cardiology was consulted. Also notably COVID positive on admission. ? ?Assessment & Plan  ?  ?#Paroxysmal atrial fibrillation: ?Very limited medication  options. Discussions held previously with EP and primary Cardiologist with the decision made to load with amiodarone despite known lung disease in order to try to maintain sinus rhythm.  ?-Continue amiodarone 400 mg twice a day for 1 week, then 200 mg twice a day for 1 week, and then 200 mg a day thereafter ?- At some point consider 100 mg of amiodarone, lowest dose possible to help maintain her normal rhythm. ?- Continue metop 6m XL daily ?- Continue apixabam 563mbID ? 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 RHC with severe pulmonary HTN with mPAP 4267m, PCWP 9. On chronic 4-6 L O2 at home that has been steadily increasing to 9-12L. Has been seen by palliative and remains full code with full scope of medical care. Pulm following. ?-Management per Pulm and primary ?-Palliative consulted on multiple admissions and patient remains full code with full scope of care ? ?#Chronic diastolic heart failure ?EF 60-65%. Mainly with cor pulmonale with severe pulmonary HTN. Was diuresed with lasix IV now transitioned to torsemide PO. ?-Low torsemide to 69m46mily (did receive 40mg29mly yesterday) ?-Consider spiro/farxiga prior to discharge; will hold for now given rise in Cr ?-Low Na diet ?-Monitor I/Os and daily weights ? ?#COVID ?- On molnupiravir per primary team ? ?#CKD IIIa: ?-Trend ?-Resumed torsemide as above ? ? ?For questions or updates, please contact [facility] consult [email or web address] for contact info under  ? ?  ?   ?Signed, ?HeathFreada Bergeron ?02/14/2022, 8:42 AM   ? ?

## 2022-02-14 NOTE — Evaluation (Signed)
Physical Therapy Evaluation ?Patient Details ?Name: Elizabeth Melton ?MRN: [id number] ?DOB: [birth year] ?Today's Date: 02/14/2022 ? ?History of Present Illness ? 81 y.o. female admitted 3/2 with SOB. Pt with AFib, COPD exacerbation and Covid (+). PMhx: interstitial pulmonary fibrosis, pulm HTN, chronic respiratory failure on 4 -5 L home O2, dCHF, DM, CKD, PAD, HTN, hypothyroidism, OSA on CPAP, paroxysmal A-fib, PE on Eliquis  ?Clinical Impression ? Pt very pleasant sitting in recliner on arrival on 10L with sats 98% with ambulation in room sats 85-92% on 10L. Pt with assist of family at home and greatest concern is endurance and pulmonary function to get up stairs to 2nd floor apartment. Pt with decreased activity tolerance and function who will benefit from acute therapy to maximize function and safety. ?   ?   ? ?Recommendations for follow up therapy are one component of a multi-disciplinary discharge planning process, led by the attending physician.  Recommendations may be updated based on patient status, additional functional criteria and insurance authorization. ? ?Follow Up Recommendations Home health PT ? ?  ?Assistance Recommended at Discharge Frequent or constant Supervision/Assistance  ?Patient can return home with the following ? A little help with walking and/or transfers;A little help with bathing/dressing/bathroom;Assistance with cooking/housework;Help with stairs or ramp for entrance;Assist for transportation ? ?  ?Equipment Recommendations None recommended by PT  ?Recommendations for Other Services ?    ?  ?Functional Status Assessment Patient has had a recent decline in their functional status and demonstrates the ability to make significant improvements in function in a reasonable and predictable amount of time.  ? ?  ?Precautions / Restrictions Precautions ?Precautions: Fall ?Precaution Comments: monitor O2-  ? ?  ? ?Mobility ? Bed Mobility ?  ?  ?  ?  ?  ?  ?  ?General bed mobility comments: in  recliner on arrival and end of session ?  ? ?Transfers ?Overall transfer level: Modified independent ?  ?Transfers: Sit to/from Stand ?Sit to Stand: Supervision ?  ?  ?  ?  ?  ?General transfer comment: supervision for lines and safety ?  ? ?Ambulation/Gait ?Ambulation/Gait assistance: Min guard ?Gait Distance (Feet): 50 Feet ?Assistive device: Rolling walker (2 wheels) ?Gait Pattern/deviations: Step-through pattern, Decreased stride length, Trunk flexed ?  ?Gait velocity interpretation: <1.8 ft/sec, indicate of risk for recurrent falls ?  ?General Gait Details: cue for posture and breathing technique. Pt on 10L with sats 85-92% during gait ? ?Stairs ?  ?  ?  ?  ?  ? ?Wheelchair Mobility ?  ? ?Modified Rankin (Stroke Patients Only) ?  ? ?  ? ?Balance Overall balance assessment: Needs assistance ?  ?Sitting balance-Leahy Scale: Good ?  ?  ?Standing balance support: During functional activity, Bilateral upper extremity supported ?Standing balance-Leahy Scale: Fair ?  ?  ?  ?  ?  ?  ?  ?  ?  ?  ?  ?  ?   ? ? ? ?Pertinent Vitals/Pain Pain Assessment ?Pain Assessment: No/denies pain  ? ? ?Home Living Family/patient expects to be discharged to:: Private residence ?Living Arrangements: Children ?Available Help at Discharge: Family;Available PRN/intermittently ?Type of Home: Apartment ?Home Access: Stairs to enter ?  ?Entrance Stairs-Number of Steps: 15 ?  ?Home Layout: One level ?Home Equipment: Rollator (4 wheels);Cane - single point;Shower seat ?   ?  ?Prior Function Prior Level of Function : Needs assist ?  ?  ?  ?  ?  ?  ?Mobility Comments: assist for stairs,  rollator for gait ?ADLs Comments: doesn't drive, daughter does the homemaking ?  ? ? ?Hand Dominance  ?   ? ?  ?Extremity/Trunk Assessment  ? Upper Extremity Assessment ?Upper Extremity Assessment: Generalized weakness ?  ? ?Lower Extremity Assessment ?Lower Extremity Assessment: Generalized weakness ?  ? ?Cervical / Trunk Assessment ?Cervical / Trunk Assessment:  Normal  ?Communication  ? Communication: No difficulties  ?Cognition Arousal/Alertness: Awake/alert ?Behavior During Therapy: [facility] for tasks assessed/performed ?Overall Cognitive Status: Within Functional Limits for tasks assessed ?  ?  ?  ?  ?  ?  ?  ?  ?  ?  ?  ?  ?  ?  ?  ?  ?  ?  ?  ? ?  ?General Comments   ? ?  ?Exercises    ? ?Assessment/Plan  ?  ?PT Assessment Patient needs continued PT services  ?PT Problem List Decreased strength;Decreased balance;Decreased mobility;Decreased coordination;Cardiopulmonary status limiting activity;Pain;Decreased skin integrity ? ?   ?  ?PT Treatment Interventions DME instruction;Gait training;Stair training;Functional mobility training;Therapeutic activities;Balance training;Therapeutic exercise;Neuromuscular re-education;Patient/family education   ? ?PT Goals (Current goals can be found in the Care Plan section)  ?Acute Rehab PT Goals ?Patient Stated Goal: return home ?PT Goal Formulation: With patient ?Time For Goal Achievement: 02/28/22 ?Potential to Achieve Goals: Fair ? ?  ?Frequency Min 3X/week ?  ? ? ?Co-evaluation   ?  ?  ?  ?  ? ? ?  ?AM-PAC PT "6 Clicks" Mobility  ?Outcome Measure Help needed turning from your back to your side while in a flat bed without using bedrails?: A Little ?Help needed moving from lying on your back to sitting on the side of a flat bed without using bedrails?: A Little ?Help needed moving to and from a bed to a chair (including a wheelchair)?: A Little ?Help needed standing up from a chair using your arms (e.g., wheelchair or bedside chair)?: A Little ?Help needed to walk in hospital room?: A Little ?Help needed climbing 3-5 steps with a railing? : A Lot ?6 Click Score: 17 ? ?  ?End of Session Equipment Utilized During Treatment: Oxygen ?Activity Tolerance: Patient tolerated treatment well ?Patient left: in chair;with call bell/phone within reach;with chair alarm set ?Nurse Communication: Mobility status ?PT Visit Diagnosis: Muscle weakness  (generalized) (M62.81);Difficulty in walking, not elsewhere classified (R26.2);Other abnormalities of gait and mobility (R26.89) ?  ? ?Time: 5258-9483 ?PT Time Calculation (min) (ACUTE ONLY): 24 min ? ? ?Charges:   PT Evaluation ?$PT Eval Moderate Complexity: 1 Mod ?  ?  ?   ? ? ?Daziya Redmond P, PT ?Acute Rehabilitation Services ?Pager: [phone] ?Office: [phone] ? ? ?Katrina Brosh B Garl Speigner ?02/14/2022, 1:59 PM ? ?

## 2022-02-14 NOTE — Discharge Instructions (Addendum)
?

## 2022-02-14 NOTE — Progress Notes (Signed)
PROGRESS NOTE    Elizabeth Melton  YBO:[id number] DOB: [birth year] DOA: 02/09/2022 PCP: Kathyrn Lass, MD  Narrative73 year old female with history of COPD, IPF, chronic respiratory failure with hypoxia on 5-6 L oxygen via nasal cannula at home, severe pulmonary hypertension, chronic diastolic CHF, diabetes mellitus type 2, hypertension, paroxysmal A. fib, history of pulmonary embolism on Eliquis, PAD presented to the ED as her home health nurse noticed that her oxygenation was lower than usual.  Patient reports using 7 to 8 L of oxygen for the last several weeks, following her prolonged hospitalization from 1/18-2/3 at [facility] she was admitted to [facility] from 2/16-2/18 treated for COPD exacerbation with steroids then. -In the ED she was noted to be in rapid A-fib, spontaneously converted, followed by posttermination pause of 5 seconds, seen by cardiology, metoprolol held -3/3 back in rapid A-fib, started on amiodarone gtt. -O2 needs higher than usual -3/5, pulmonary consulted, started on steroids for COPD exacerbation +Covid  Subjective: -Feels okay overall, breathing slowly improving, wants to get up and move, down to 9 L O2  Assessment & Plan:  Acute on chronic hypoxic respiratory failure Pulmonary fibrosis Acute on chronic diastolic CHF Severe pulmonary hypertension -Echo 11/22 noted EF of 60-65%, moderate TR, PASP of 85 mmHg -Seen by pulmonary and heart failure team during hospitalization in late January, right heart cath noted severe pulmonary hypertension, treated with steroids as well then -Clinically appears euvolemic, diuresed with IV Lasix earlier, transitioned to torsemide  -Has SARS COVID-19 infection, chest x-ray and CT chest unchanged  -CT chest w/ chronic lung findings, chronic fibrosis -Appreciate pulmonary input, started on IV steroids for possible COPD flare -Seen by palliative care last 2 hospitalizations as well as this admission, prognosis remains poor, high risk of quick  readmission, she wishes for full code, full scope of Rx -Increase activity, PT eval, attempt to wean O2 down to 6 L  * PAF (paroxysmal atrial fibrillation) ([place]) with RVR -3/2 had had posttermination pause of 5 seconds, metoprolol held briefly -then developed rapid A-fib this admission, uncontrolled on Cardizem gtt., options limited with lung disease, started on amiodarone as a temporizing measure, cardiology following -Now switched to p.o. 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 Chronic respiratory failure on 5 to 6 L O2 --Outpatient follow-up with pulmonary   Type 2 diabetes mellitus with hyperglycemia ([place])- a1c in 10/2021: 7.6 Continue lantus 10 units daily and moderate SSI -Add meal coverage on multiple occasions   Essential (primary) hypertension- -Stable, meds as above   Hyperlipidemia/PAD-  Continue mevacor   Hypothyroidism-  Last TSH 05/2021 and wnl Continue home synthroid   OSA -CPAP QHS   Goals of care discussion: Patient has advanced ILD, chronic respiratory failure, COPD on 6 L home O2, recently has been using 7 to 8 L, also has severe PAH, cor pulmonale, diastolic CHF, CKD, A-fib RVR, her prognosis is poor, this has been discussed again with the patient at length today.  She was  seen by palliative care in January and again in February , recommended  consideration of DNR, she wishes to remain full code with full scope of treatment for now, she tells me that she wishes for [place] to attempt resuscitation and life support so her family can see her before she passes, when it gets to the point.  DVT prophylaxis: Apixaban Code Status: Full code Family Communication: Discussed patient in detail, daughter updated from patient's room yesterday Disposition Plan: Home pending improvement in hypoxia  consultants: Cardiology, pulmonary  Procedures:   Antimicrobials:    Objective: Vitals:   02/13/22 2118 02/14/22 0400 02/14/22 0738 02/14/22 0810  BP:  (!) 143/59  (!) 120/50  Pulse: (!) 54 (!) 52  (!) 52  Resp: _0 Temp:  97.6 F (36.4 C)  97.6 F (36.4 C)  TempSrc:  Oral  Oral  SpO2: 92% 96% 92% 100%  Weight:  73.1 kg    Height:        Intake/Output Summary (Last 24 hours) at 02/14/2022 1132 Last data filed at 02/14/2022 0522 Gross per 24 hour  Intake 320 ml  Output 700 ml  Net -380 ml   Filed Weights   02/12/22 0631 02/13/22 0605 02/14/22 0400  Weight: 71.4 kg 71.5 kg 73.1 kg    Examination:  General exam: Chronically ill pleasant female sitting up in bed, AAOx3, no distress HEENT: No JVD CVS: S1-S2, regular rhythm Lungs: Bilateral rales, unchanged Abdomen: Soft, nontender, bowel sounds present Extremities: No edema  Skin: No rashes Psychiatry: Judgement and insight appear normal. Mood & affect appropriate.     Data Reviewed:   CBC: Recent Labs  Lab 02/09/22 1441 02/09/22 1546 02/10/22 0336 02/11/22 0333 02/12/22 0221 02/13/22 0359  WBC 14.1*  --  10.2 8.4 8.0 8.0  NEUTROABS 11.0*  --   --   --   --   --   HGB 15.8* 10.5* 13.9 14.1 14.4 14.8  HCT 47.2* 31.0* 43.5 42.2 42.8 43.6  MCV 100.0  --  102.4* 99.8 99.5 98.9  PLT 153  --  162 148* 156 672*   Basic Metabolic Panel: Recent Labs  Lab 02/09/22 1522 02/09/22 1546 02/10/22 0942 02/11/22 0333 02/12/22 0221 02/13/22 0359 02/14/22 0749  NA  --     < > 139 135 134* 131* 133*  K  --    < > 4.4 3.2* 4.1 4.5 4.8  CL  --    < > 103 102 102 99 99  CO2  --    < > _1 19* 23  GLUCOSE  --    < > 190* 140* 114* 177* 198*  BUN  --    < > 25* 20 20 31* 50*  CREATININE  --    < > 1.18* 1.06* 1.20* 1.40* 1.65*  CALCIUM  --    < > 8.8* 8.3* 8.6* 8.5* 9.1  MG 2.1  --   --   --   --   --   --    < > = values in this interval not displayed.   GFR: Estimated Creatinine Clearance: 28.3 mL/min (A) (by C-G formula based on SCr of 1.65 mg/dL (H)). Liver Function Tests: Recent Labs  Lab 02/10/22 0942 02/11/22 0333  AST 32 23  ALT 20 19  ALKPHOS 62 58  BILITOT 1.2 0.7  PROT 6.2* 6.0*  ALBUMIN 3.1* 2.7*   No results for input(s): LIPASE, AMYLASE in the last 168 hours. No results  for input(s): AMMONIA in the last 168 hours. Coagulation Profile: No results for input(s): INR, PROTIME in the last 168 hours. Cardiac Enzymes: No results for input(s): CKTOTAL, CKMB, CKMBINDEX, TROPONINI in the last 168 hours. BNP (last 3 results) No results for input(s): PROBNP in the last 8760 hours. HbA1C: No results for input(s): HGBA1C in the last 72 hours. CBG: Recent Labs  Lab 02/13/22 0559 02/13/22 1243 02/13/22 1732 02/13/22 2025 02/14/22 0503  GLUCAP 154* 200* 285* 287* 194*   Lipid Profile: No results for input(s): CHOL, HDL, LDLCALC, TRIG, CHOLHDL, LDLDIRECT in the last 72 hours. Thyroid Function Tests: No results for input(s): TSH, T4TOTAL, FREET4, T3FREE, THYROIDAB in the last 72 hours. Anemia Panel: Recent Labs    02/13/22 0359 02/14/22 0749  FERRITIN 485* 498*   Urine analysis:    Component Value Date/Time   COLORURINE YELLOW (A) 01/26/2022 0241   APPEARANCEUR CLEAR (A) 01/26/2022 0241   APPEARANCEUR Clear 09/20/2021 1449   LABSPEC 1.008 01/26/2022 0241   PHURINE 5.0 01/26/2022 0241   GLUCOSEU NEGATIVE 01/26/2022 0241   HGBUR NEGATIVE 01/26/2022 0241   BILIRUBINUR NEGATIVE 01/26/2022 0241   BILIRUBINUR Negative 09/20/2021  1449   KETONESUR NEGATIVE 01/26/2022 0241   PROTEINUR NEGATIVE 01/26/2022 0241   NITRITE NEGATIVE 01/26/2022 0241   LEUKOCYTESUR NEGATIVE 01/26/2022 0241   Sepsis Labs: _0 (procalcitonin:4,lacticidven:4)  ) Recent Results (from the past 240 hour(s))  Resp Panel by RT-PCR (Flu A&B, Covid) Nasopharyngeal Swab     Status: Abnormal   Collection Time: 02/09/22  2:59 PM   Specimen: Nasopharyngeal Swab; Nasopharyngeal(NP) swabs in vial transport medium  Result Value Ref Range Status   SARS Coronavirus 2 by RT PCR POSITIVE (A) NEGATIVE Final    Comment: (NOTE) SARS-CoV-2 target nucleic acids are DETECTED.  The SARS-CoV-2 RNA is generally detectable in upper respiratory specimens during the acute phase of infection. Positive results are indicative of the presence of the identified virus, but do not rule out bacterial infection or co-infection with other pathogens not detected by the test. Clinical correlation with patient history and other diagnostic information is necessary to determine patient infection status. The expected result is Negative.  Fact Sheet for Patients: EntrepreneurPulse.com.au  Fact Sheet for Healthcare Providers: IncredibleEmployment.be  This test is not yet approved or cleared by the [place] FDA and  has been authorized for detection and/or diagnosis of SARS-CoV-2 by FDA under an Emergency Use Authorization (EUA).  This EUA will remain in effect (meaning this test can be used) for the duration of  the COVID-19 declaration under Section 564(b)(1) of the A ct, 21 U.S.C. section 360bbb-3(b)(1), unless the authorization is terminated or revoked sooner.     Influenza A by PCR NEGATIVE NEGATIVE Final   Influenza B by PCR NEGATIVE NEGATIVE Final    Comment: (NOTE) The Xpert Xpress SARS-CoV-2/FLU/RSV plus assay is intended as an aid in the diagnosis of influenza from Nasopharyngeal swab specimens and should not be used  as a sole basis for treatment. Nasal washings and aspirates are unacceptable for Xpert Xpress SARS-CoV-2/FLU/RSV testing.  Fact Sheet for Patients: EntrepreneurPulse.com.au  Fact Sheet for Healthcare Providers: IncredibleEmployment.be  This test is not yet approved or cleared by the [place] FDA and has been authorized for detection and/or diagnosis of SARS-CoV-2 by FDA under an Emergency Use Authorization (EUA). This EUA will remain in effect (meaning this test can be used) for the duration of the COVID-19 declaration under Section 564(b)(1) of the Act, 21 U.S.C. section 360bbb-3(b)(1), unless the authorization  is terminated or revoked.  Performed at [facility], [place] [address], [place], [place] [address]      Radiology Studies: No results found.   Scheduled Meds:  amiodarone  400 mg Oral BID   apixaban  5 mg Oral BID   arformoterol  15 mcg Nebulization BID   vitamin C  500 mg Oral Daily   budesonide (PULMICORT) nebulizer solution  0.5 mg Nebulization BID   buPROPion  150 mg Oral Daily   DULoxetine  30 mg Oral Daily   guaiFENesin  1,200 mg Oral BID   insulin aspart  0-15 Units Subcutaneous TID WC   insulin aspart  0-5 Units Subcutaneous QHS   insulin glargine-yfgn  10 Units Subcutaneous Daily   levothyroxine  75 mcg Oral QAC breakfast   metoprolol succinate  25 mg Oral Daily   nicotine  14 mg Transdermal Daily   pantoprazole  40 mg Oral Daily   pravastatin  40 mg Oral q1800   revefenacin  175 mcg Nebulization Daily   sodium chloride flush  3 mL Intravenous Q12H   torsemide  20 mg Oral Daily   zinc sulfate  220 mg Oral Daily   Continuous Infusions:     LOS: 4 days    Time spent: 82mn  PDomenic Polite MD [facility]   02/14/2022, 11:32 AM

## 2022-02-14 NOTE — Progress Notes (Signed)
? ?NAME:  Elizabeth Melton, MRN:  [id number], DOB:  02/21/1941, LOS: 4 ?ADMISSION DATE:  02/09/2022, CONSULTATION DATE:  02/12/22 ?REFERRING MD:  Broadus John - [facility], CHIEF COMPLAINT:  Acute on chronic hypoxia   ? ?History of Present Illness:  ?81 yo F PMH chronic hypoxic respiratory failure (uses 4-6L at baseline), IPF, chronic diastolic HF, pulmonary hypertension, Afib, submassive PE, HTN, prior tobacco use who presented to ED 3/2 with increased HR and hypoxia after being seen by home health nurse. In ED, had converted out of Afib RVR to NSR, after a 5 second pause. She was hypoxic in ED requiring NRB. Admitted to [facility].  ?Hospital course marked by recurrent Afib RVR for which cardiology has been consulted, as well as increasing O2 requirement. She has been diuresed per cardiology guidance, and a CT chest which did not show significant progression of fibrosis compared to most recent imaging -- O2 requirement remains elevated, requiring 10L HFNC 3/5 ? ?PCCM is consulted in this setting ? ?Of note this is the patient's 3rd admission in 2023.  Palliative medicine saw her on 2/17 and the patient's family requested full scope of practice.  ? ?She tells me that she has had worsening cough and mucus production over the last few days.  Typically she will cough up a small amount of sputum in the mornings, but lately it has been more voluminous, darker, and bloodier than normal.  Also with increased dyspnea and wheezing.  Minimal leg swelling.  ? ?She is fully unvaccinated against COVID-19.  ? ?Pertinent  Medical History  ?IPF ?Chronic diastolic HF ?COPD ?Pulmonary hypertension  ?Submassive PE  ?DM2 ?Afib ?Hypothyroidism  ?CKD IIIA ?OSA ? ?Significant Hospital Events: ?Including procedures, antibiotic start and stop dates in addition to other pertinent events   ?3/2 to ED with incr HR and hypoxia after [facility] visit. Admit to [facility]. Cards consulted. Metop dc ?3/3 back in Afib -- started on IV amio. Restarted metop. Palli consult, full code   ?3/4 worse resp effort. Diuresed. Back in sinus ?3/5 pulm consult. Started demadex ? ?Interim History / Subjective:  ?No acute issues.  ? ?Objective   ?Blood pressure (!) 120/50, pulse (!) 52, temperature 97.6 ?F (36.4 ?C), temperature source Oral, resp. rate 12, height _0  (1.676 m), weight 73.1 kg, SpO2 92 %. ?   ?   ? ?Intake/Output Summary (Last 24 hours) at 02/14/2022 1532 ?Last data filed at 02/14/2022 0522 ?Gross per 24 hour  ?Intake --  ?Output 700 ml  ?Net -700 ml  ? ?Filed Weights  ? 02/12/22 0631 02/13/22 0605 02/14/22 0400  ?Weight: 71.4 kg 71.5 kg 73.1 kg  ? ? ?Examination: ?General:  Chronically ill appearing, resting comfortably in chair ?HENT: NCAT OP clear ?PULM: Good air movement bl, normal effort ?CV: RRR, +systolic murmur ?GI: soft, nontender ?Derm: minimal edema in legs ?Neuro: awake, alert, no distress, MAEW ? ? ?Resolved Hospital Problem list   ? ? ?Assessment & Plan:  ? ?Acute on chronic hypoxemic respiratory failure ?IPF ?pHTN ?Diastolic HF ?COPD with exacerbation ?COVID -19 pneumonia ?OSA ?Unvaccinated against COVID 19 ? ?Discussion: ?Most likely she is having COPD exacerbation secondary to COVID-19 infection, covid-19 pneumonia. This likely lead to the atrial fibrillation.  Does not seem to have extent of widespread ggo that I would expect for ILD flare and timing doesn't necessarily fit amiodarone tox. ? ?Plan: ?- PT/mobilize ?- Solumedrol 60 mg daily IV, will need at least 10d course of steroids for covid-19, may need taper ?-  Brovana, budesonide, yupelri ?- completed course of molnupiravir ?- Guaifenesin 1238m bid ?- Flutter/incentive to continue at least 10 slow puffs BID ?- Wean off O2 for O2 saturation > 92%. Currently on 9L for sats 91. ?- Amiodarone, anticoagulation, diuresis per cardiology ?- Would hold off on starting inhaled pulmonary vasodilators as of yet  ?- she has not been approved for this as an outpatient ?- Appreciate palliative evaluation ?- On discharge continue  Breztri 2 puffs twice daily for inhaled therapies. Stop Stiolto per pulmonary clinic note.  ? ?NWalker Shadow ?[place]? ?See Amion for personal pager ?PCCM on call pager ([phone] 7pm. ?Please call Elink 7p-7a. [phone]? ?02/14/2022 3:32 PM ? ? ? ? ?

## 2022-02-14 NOTE — Progress Notes (Signed)
Placed patient in chair with chair alarm on. Personal items and call bell within reach. Pt tolerated transfer well and is still on high flow oxygen at 8 Liters. ?

## 2022-02-14 NOTE — Plan of Care (Signed)
?  Problem: Education: ?Goal: Knowledge of disease or condition will improve ?Outcome: Adequate for Discharge ?Goal: Understanding of medication regimen will improve ?Outcome: Adequate for Discharge ?Goal: Individualized Educational Video(s) ?Outcome: Adequate for Discharge ?  ?Problem: Activity: ?Goal: Ability to tolerate increased activity will improve ?Outcome: Adequate for Discharge ?  ?Problem: Cardiac: ?Goal: Ability to achieve and maintain adequate cardiopulmonary perfusion will improve ?Outcome: Adequate for Discharge ?  ?Problem: Health Behavior/Discharge Planning: ?Goal: Ability to safely manage health-related needs after discharge will improve ?Outcome: Adequate for Discharge ?  ?Problem: Education: ?Goal: Knowledge of General Education information will improve ?Description: Including pain rating scale, medication(s)/side effects and non-pharmacologic comfort measures ?Outcome: Adequate for Discharge ?  ?Problem: Health Behavior/Discharge Planning: ?Goal: Ability to manage health-related needs will improve ?Outcome: Adequate for Discharge ?  ?Problem: Clinical Measurements: ?Goal: Ability to maintain clinical measurements within normal limits will improve ?Outcome: Adequate for Discharge ?Goal: Will remain free from infection ?Outcome: Adequate for Discharge ?Goal: Diagnostic test results will improve ?Outcome: Adequate for Discharge ?Goal: Respiratory complications will improve ?Outcome: Adequate for Discharge ?Goal: Cardiovascular complication will be avoided ?Outcome: Adequate for Discharge ?  ?Problem: Activity: ?Goal: Risk for activity intolerance will decrease ?Outcome: Adequate for Discharge ?  ?Problem: Nutrition: ?Goal: Adequate nutrition will be maintained ?Outcome: Adequate for Discharge ?  ?Problem: Coping: ?Goal: Level of anxiety will decrease ?Outcome: Adequate for Discharge ?  ?Problem: Elimination: ?Goal: Will not experience complications related to bowel motility ?Outcome: Adequate for  Discharge ?Goal: Will not experience complications related to urinary retention ?Outcome: Adequate for Discharge ?  ?Problem: Pain Managment: ?Goal: General experience of comfort will improve ?Outcome: Adequate for Discharge ?  ?Problem: Safety: ?Goal: Ability to remain free from injury will improve ?Outcome: Adequate for Discharge ?  ?Problem: Skin Integrity: ?Goal: Risk for impaired skin integrity will decrease ?Outcome: Adequate for Discharge ?  ?Problem: Education: ?Goal: Ability to demonstrate management of disease process will improve ?Outcome: Adequate for Discharge ?Goal: Ability to verbalize understanding of medication therapies will improve ?Outcome: Adequate for Discharge ?Goal: Individualized Educational Video(s) ?Outcome: Adequate for Discharge ?  ?Problem: Activity: ?Goal: Capacity to carry out activities will improve ?Outcome: Adequate for Discharge ?  ?Problem: Cardiac: ?Goal: Ability to achieve and maintain adequate cardiopulmonary perfusion will improve ?Outcome: Adequate for Discharge ?  ?

## 2022-02-15 DIAGNOSIS — I10 Essential (primary) hypertension: Secondary | ICD-10-CM

## 2022-02-15 DIAGNOSIS — N179 Acute kidney failure, unspecified: Secondary | ICD-10-CM | POA: Diagnosis not present

## 2022-02-15 DIAGNOSIS — I4891 Unspecified atrial fibrillation: Secondary | ICD-10-CM | POA: Diagnosis not present

## 2022-02-15 DIAGNOSIS — I455 Other specified heart block: Secondary | ICD-10-CM | POA: Diagnosis not present

## 2022-02-15 DIAGNOSIS — E785 Hyperlipidemia, unspecified: Secondary | ICD-10-CM

## 2022-02-15 DIAGNOSIS — N189 Chronic kidney disease, unspecified: Secondary | ICD-10-CM | POA: Diagnosis present

## 2022-02-15 DIAGNOSIS — F39 Unspecified mood [affective] disorder: Secondary | ICD-10-CM

## 2022-02-15 DIAGNOSIS — E1169 Type 2 diabetes mellitus with other specified complication: Secondary | ICD-10-CM

## 2022-02-15 DIAGNOSIS — U071 COVID-19: Secondary | ICD-10-CM | POA: Diagnosis not present

## 2022-02-15 DIAGNOSIS — J1282 Pneumonia due to coronavirus disease 2019: Secondary | ICD-10-CM

## 2022-02-15 DIAGNOSIS — I480 Paroxysmal atrial fibrillation: Secondary | ICD-10-CM | POA: Diagnosis not present

## 2022-02-15 DIAGNOSIS — E039 Hypothyroidism, unspecified: Secondary | ICD-10-CM

## 2022-02-15 DIAGNOSIS — J849 Interstitial pulmonary disease, unspecified: Secondary | ICD-10-CM | POA: Diagnosis not present

## 2022-02-15 LAB — BASIC METABOLIC PANEL
Anion gap: 11 (ref 5–15)
BUN: 59 mg/dL — ABNORMAL HIGH (ref 8–23)
CO2: 22 mmol/L (ref 22–32)
Calcium: 9.1 mg/dL (ref 8.9–10.3)
Chloride: 102 mmol/L (ref 98–111)
Creatinine, Ser: 1.7 mg/dL — ABNORMAL HIGH (ref 0.44–1.00)
GFR, Estimated: 30 mL/min — ABNORMAL LOW (ref >60)
Glucose, Bld: 214 mg/dL — ABNORMAL HIGH (ref 70–99)
Potassium: 5.2 mmol/L — ABNORMAL HIGH (ref 3.5–5.1)
Sodium: 135 mmol/L (ref 135–145)

## 2022-02-15 LAB — GLUCOSE, CAPILLARY
Glucose-Capillary: 242 mg/dL — ABNORMAL HIGH (ref 70–99)
Glucose-Capillary: 224 mg/dL — ABNORMAL HIGH (ref 70–99)
Glucose-Capillary: 273 mg/dL — ABNORMAL HIGH (ref 70–99)
Glucose-Capillary: 236 mg/dL — ABNORMAL HIGH (ref 70–99)

## 2022-02-15 LAB — CBC
HCT: 45 % (ref 36.0–46.0)
Hemoglobin: 14.6 g/dL (ref 12.0–15.0)
MCH: 33 pg (ref 26.0–34.0)
MCHC: 32.4 g/dL (ref 30.0–36.0)
MCV: 101.8 fL — ABNORMAL HIGH (ref 80.0–100.0)
Platelets: 196 10*3/uL (ref 150–400)
RBC: 4.42 MIL/uL (ref 3.87–5.11)
RDW: 16.8 % — ABNORMAL HIGH (ref 11.5–15.5)
WBC: 10.4 10*3/uL (ref 4.0–10.5)
nRBC: 0 % (ref 0.0–0.2)

## 2022-02-15 LAB — C-REACTIVE PROTEIN: CRP: 2.5 mg/dL — ABNORMAL HIGH (ref <1.0)

## 2022-02-15 LAB — FERRITIN: Ferritin: 490 ng/mL — ABNORMAL HIGH (ref 11–307)

## 2022-02-15 MED ORDER — METHYLPREDNISOLONE SODIUM SUCC 40 MG IJ SOLR
40.0000 mg | Freq: Every day | INTRAMUSCULAR | Status: DC
  Administered 2022-02-16: 10:00:00 40 mg via INTRAVENOUS
  Filled 2022-02-15: qty 1

## 2022-02-15 NOTE — Plan of Care (Signed)
?  Problem: Education: ?Goal: Knowledge of disease or condition will improve ?Outcome: Progressing ?  ?Problem: Education: ?Goal: Understanding of medication regimen will improve ?Outcome: Progressing ?  ?

## 2022-02-15 NOTE — Hospital Course (Addendum)
Mrs. Foxworth was admitted to the hospital with the working diagnosis of acute on chronic hypoxemic respiratory failure due to decompensated heart failure in the setting of pulmonary fibrosis.   81 yo female with the past medical history of IPF, COPD, heart failure, pulmonary hypertension, atrial fibrillation, T2DM, pulmonary embolism and chronic hypoxemic respiratory failure who presented with dyspnea. At home patient had worsening dyspnea, worsening oxygenation despite supplemental 02. Home nurse called EMS, his heart rate was greater than 200 (atrial fibrillation) and patient was transported to the hospital. At the time of his arrival his heart rate improved to 90, blood pressure 112/51, rr 10 and 02 saturation 96%, lungs with diffuse rhonchi but no wheezing, heart with S1 and S2 present and rhythmic, no gallops, abdomen soft and no lower extremity edema.  Na 140, K 4,3 Cl 111, bicarb 24 glucose 126, bun 55 and cr 2,8 Ca I 1,43  Wbc 10,2 hgb 13,9 hct 43, 5 plt 162  SARS covid 19 positive   Chest radiograph with bilateral increased lung markings, enlarged right ventricle/ right atrium.   CT chest with emphysema and interseptal thickening, with honey combing, consistent with pulmonary fibrosis.  9 mm right upper outer breat mass.  Similar multiple pulmonary nodules.   EKG 91 bpm, normal axis, normal intervals, sinus rhythm with right atrial enlargement, no significant ST segment or T wave changes.   Started on steroids and malnupiravir for COVID 19 infection  03/03 patient had recurrent atrial fibrillation with RVR and was placed on amiodarone infusion.  03/13 heart rate is controlled, sinus rhythm. Slowly improving oxygenation but continue with high oxygen requirements. Discontinue respiratory isolation, more than 10 days since positive test and no signs of active viral infection

## 2022-02-15 NOTE — Progress Notes (Signed)
?Progress Note ? ? ?Patient: Elizabeth Melton TFT:[id number] DOB: 1941-08-29 DOA: 02/09/2022     5 ?DOS: the patient was seen and examined on 02/15/2022 ?  ?Brief hospital course: ?Mrs. Wince was admitted to the hospital with the working diagnosis of acute on chronic hypoxemic respiratory failure due to decompensated heart failure in the setting of pulmonary fibrosis.  ? ?81 yo female with the past medical history of IPF, COPD, heart failure, pulmonary hypertension, atrial fibrillation, T2DM, pulmonary embolism and chronic hypoxemic respiratory failure who presented with dyspnea. At home patient had worsening dyspnea, worsening oxygenation despite supplemental 02. Home nurse called EMS, his heart rate was greater than 200 (atrial fibrillation) and patient was transported to the hospital. At the time of his arrival his heart rate improved to 90, blood pressure 112/51, rr 10 and 02 saturation 96%, lungs with diffuse rhonchi but no wheezing, heart with S1 and S2 present and rhythmic, no gallops, abdomen soft and no lower extremity edema. ? ?Na 140, K 4,3 Cl 111, bicarb 24 glucose 126, bun 55 and cr 2,8 ?Ca I 1,43  ?Wbc 10,2 hgb 13,9 hct 43, 5 plt 162  ?SARS covid 19 positive  ? ?Chest radiograph with bilateral increased lung markings, enlarged right ventricle/ right atrium.  ? ?CT chest with emphysema and interseptal thickening, with honey combing, consistent with pulmonary fibrosis.  ?9 mm right upper outer breat mass.  ?Similar multiple pulmonary nodules.  ? ?EKG 91 bpm, normal axis, normal intervals, sinus rhythm with right atrial enlargement, no significant ST segment or T wave changes.  ? ?Started on steroids and malnupiravir for COVID 19 infection ? ?03/03 patient had recurrent atrial fibrillation with RVR and was placed on amiodarone infusion. ? ? ? ? ?Assessment and Plan: ?* PAF (paroxysmal atrial fibrillation) ([place]) ?Patient with improved heart rate.  ? ?Plan to continue rate control with amiodarone with close  monitoring of pulmonary function.  ?Continue telemetry monitoring. ?Anticoagulation with apixaban.  ? ?Pneumonia due to COVID-19 virus ?Patient with viral pneumonia and worsening respiratory failure with hypoxemia.  ? ?Continue with systemic steroids ?Patient has completed antiviral therapy. ?Check inflammatory markers in am,  ? ?ILD (interstitial lung disease) ([place]) ?Acute on chronic hypoxemic respiratory failure. ?Oxygenation is 91% on 8 L /min per HFNC. ? ?Plan to continue supportive care with systemic and inhaled corticosteroids. ?Continue bronchodilator therapy and airway clearing techniques.  ? ?Continue with Tyvaso (family asked to bring that in) ? ? ?Acute kidney injury superimposed on chronic kidney disease ([place]) ?hyperkalemia ?Renal function with serum cr at 1,70 with K at 5,1 and serum bicarbonate at 22. ? ?Continue close monitoring of renal function, hold on IV fluids for now in the setting if acute viral pneumonia.  ? ?Essential (primary) hypertension ?Continue blood pressure control with metoprolol.  ? ?Type 2 diabetes mellitus with hyperlipidemia ([place]) ?Uncontrolled hyperglycemia.  ? ?Plan to continue glucose cover and monitoring with insulin sliding scale. ?Continue pre- meal and basal insulin.  ?Continue statin therapy.  ? ?Mood disorder ([place]) ?On Wellbutrin, Cymbalta ? ?Hypothyroidism ?On levothyroxine  ? ?Hyperlipidemia/PAD ?-Continue Mevacor (pravastatin per formulary) ? ?Tobacco abuse ?Continue with nicotine patch. ? ? ?Breast mass ?Incidentally noted on CT chest, will need mammogram as outpatient when stable ? ? ? ? ?  ? ?Subjective: Patient continue to have dyspnea and cough not back to baseline. No nausea or vomiting.  ? ?Physical Exam: ?Vitals:  ? 02/14/22 0810 02/14/22 1352 02/14/22 2051 02/15/22 0530  ?BP: (!) 120/50   135/66  ?  Pulse: (!) 52   (!) 57  ?Resp: 12   20  ?Temp: 97.6 ?F (36.4 ?C)   98.1 ?F (36.7 ?C)  ?TempSrc: Oral   Oral  ?SpO2: 100% 92% 92% 100%  ?Weight:    73.2 kg  ?Height:       ? ?Neurology awake and alert ?ENT with no pallor ?Cardiovascular with S1 and S2 present with no gallops ?No JVD ?No lower extremity edema.,  ?Respiratory with poor ventilation and prolonged expiratory phase, scattered bilateral rales but no wheezing ?Abdomen not distended  ?Data Reviewed: ? ? ? ?Family Communication: I spoke with patient's daughter at the bedside, we talked in detail about patient's condition, plan of care and prognosis and all questions were addressed. ? ? ?Disposition: ?Status is: Inpatient ?Remains inpatient appropriate because: respiratory failure  ? Planned Discharge Destination: Home ? ?Author: ?Tawni Millers, MD ?02/15/2022 3:16 PM ? ?For on call review [email or web address].  ?

## 2022-02-15 NOTE — TOC Initial Note (Signed)
Transition of Care (TOC) - Initial/Assessment Note  ? ? ?Patient Details  ?Name: Elizabeth Melton ?MRN: [id number] ?Date of Birth: 02/14/1941 ? ?Transition of Care (TOC) CM/SW Contact:    ?Zenon Mayo, RN ?Phone Number: ?02/15/2022, 3:26 PM ? ?Clinical Narrative:                 ?From home with daughter, who is th HPOA, [name].  Patient has home oxygen from [place] and she has a cpap machine from [employer].  [name] states patient is active with [facility] Lemmings for [facility] [place] and they would like to continue, NCM confirmed with [facility] .  Also [name] states patient needs another face mask for her cpap machine.  NCM asked Thedore Mins with [employer] to speak with daughter regarding getting another mask for patient.  [name] states she will be transporting patient home or it will be her brother at time of discharge. Patient continues to be on  8 liters, will need to wean down to baskeline of 6.5.  TOC will continue to follow.  ? ?Expected Discharge Plan: [place] ?Barriers to Discharge: Continued Medical Work up ? ? ?Patient Goals and CMS Choice ?Patient states their goals for this hospitalization and ongoing recovery are:: return home ?CMS Medicare.gov Compare Post Acute Care list provided to:: Patient Represenative (must comment) ?Choice offered to / list presented to : Adult Children, HC POA / Guardian ? ?Expected Discharge Plan and Services ?Expected Discharge Plan: [place] ?In-house Referral: NA ?Discharge Planning Services: CM Consult ?Post Acute Care Choice: Home Health ?Living arrangements for the past 2 months: [place] ?                ?  ?DME Agency: NA ?  ?  ?  ?HH Arranged: RN, PT, OT, Disease Management ?[place] Agency: [place] ?Date HH Agency Contacted: 02/15/22 ?Time [place]: 3235 ?Representative spoke with at [place]: Tommi Rumps ? ?Prior Living Arrangements/Services ?Living arrangements for the past 2 months: [place] ?Lives with:: Adult  Children ?Patient language and need for interpreter reviewed:: Yes ?Do you feel safe going back to the place where you live?: Yes      ?Need for Family Participation in Patient Care: Yes (Comment) ?Care giver support system in place?: Yes (comment) ?Current home services: DME (cpap machine, home oxygen at 6.5 liters) ?Criminal Activity/Legal Involvement Pertinent to Current Situation/Hospitalization: No - Comment as needed ? ?Activities of Daily Living ?Home Assistive Devices/Equipment: Gilford Rile (specify type) ?ADL Screening (condition at time of admission) ?Patient's cognitive ability adequate to safely complete daily activities?: Yes ?Is the patient deaf or have difficulty hearing?: No ?Does the patient have difficulty seeing, even when wearing glasses/contacts?: No ?Does the patient have difficulty concentrating, remembering, or making decisions?: No ?Patient able to express need for assistance with ADLs?: Yes ?Does the patient have difficulty dressing or bathing?: No ?Independently performs ADLs?: Yes (appropriate for developmental age) ?Does the patient have difficulty walking or climbing stairs?: Yes ?Weakness of Legs: None ?Weakness of Arms/Hands: None ? ?Permission Sought/Granted ?  ?  ? Share Information with NAME: sylvia hammock ?   ? Permission granted to share info w Relationship: daughter ? Permission granted to share info w Contact Information: [phone] ? ?Emotional Assessment ?  ?Attitude/Demeanor/Rapport: Engaged ?Affect (typically observed): Appropriate ?Orientation: : Oriented to Self, Oriented to Place, Oriented to  Time, Oriented to Situation ?Alcohol / Substance Use: Not Applicable ?Psych Involvement: No (comment) ? ?  Admission diagnosis:  PAF (paroxysmal atrial fibrillation) ([place]) [I48.0] ?Atrial flutter with rapid ventricular response ([place]) [I48.92] ?Sinus pause [I45.5] ?AKI (acute kidney injury) ([place]) [N17.9] ?Atrial fibrillation with RVR ([place]) [I48.91] ?Patient Active Problem List  ?  Diagnosis Date Noted  ? Acute kidney injury superimposed on chronic kidney disease ([place]) 02/15/2022  ? Breast mass 02/12/2022  ? Atrial flutter with rapid ventricular response ([place]) 02/10/2022  ? PAF (paroxysmal atrial fibrillation) ([place]) 02/09/2022  ? COVID-19 virus infection 02/09/2022  ? Mood disorder ([place]) 02/09/2022  ? Chronic obstructive pulmonary disease with acute exacerbation ([place]) 01/26/2022  ? Acute renal failure superimposed on stage 3b chronic kidney disease ([place]) 01/26/2022  ? Thrombocytopenia ([place])   ? Elevated lactic acid level   ? Respiratory distress   ? Hypoxia   ? Low back pain 01/11/2022  ? Acute respiratory failure ([place]) 12/29/2021  ? COPD with chronic bronchitis and emphysema ([place])   ? 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  12/28/2021  ? Hyperkalemia 12/28/2021  ? CKD (chronic kidney disease) 12/28/2021  ? Acute right-sided CHF (congestive heart failure) ([place]) 11/06/2021  ? Elevated troponin 11/03/2021  ? Constipation 11/03/2021  ? Pulmonary HTN ([place])   ? Acute on chronic systolic (congestive) heart failure ([place]) 05/19/2021  ? Acute on chronic respiratory failure with hypoxia ([place]) 05/19/2021  ? SOB (shortness of breath) 05/19/2021  ? AF (paroxysmal atrial fibrillation) ([place]) 05/19/2021  ? Acute on chronic congestive heart failure ([place])   ? Demand ischemia ([place])   ? Pneumonia due to COVID-19 virus 01/16/2021  ? Acute respiratory failure with hypoxia ([place]) 09/06/2020  ? 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 ILD (interstitial lung disease) ([place]) 10/23/2019  ? Chronic respiratory failure with hypoxia ([place]) 10/23/2019  ? Essential (primary) hypertension 09/18/2018  ? PAD (peripheral artery disease) ([place]) 09/18/2018  ? Tobacco abuse 09/18/2018  ? Aortic regurgitation 09/18/2018  ? Hypothyroidism   ? Hyperlipidemia/PAD   ? Type 2 diabetes mellitus with hyperlipidemia ([place])   ? ?PCP:  Kathyrn Lass, MD ?Pharmacy:   ?[id number] [place] - [address]?Phone: [phone]: [phone]? ?[facility] ?[address]?Phone: [phone]: [phone] ? ? ? ? ?Social Determinants of Health (SDOH) Interventions ?  ? ?Readmission Risk Interventions ?Readmission Risk Prevention Plan 02/15/2022 09/10/2020  ?Post Dischage Appt - Complete  ?Medication Screening - Complete  ?Transportation Screening Complete Complete  ?Medication Review (Press photographer Complete -  ?PCP or Specialist appointment within 3-5 days of discharge Complete -  ?[facility] Consult Complete -  ?SW Recovery Care/Counseling Consult Complete -  ?Palliative Care Screening Not Applicable -  ?[place] Applicable -  ?Some recent data might be hidden  ? ? ? ?

## 2022-02-15 NOTE — Progress Notes (Addendum)
? ?Progress Note ? ?Patient Name: Elizabeth Melton ?Date of Encounter: 02/15/2022 ? ?[facility] Cardiologist: [name], MD  ? ?Subjective  ? ?States she is wet and would like her bed sheets to be changed. Breathing stable. No chest pain. ? ?Cr rising on torsemide to 1.7 ? ?Inpatient Medications  ?  ?Scheduled Meds: ? amiodarone  400 mg Oral BID  ? apixaban  5 mg Oral BID  ? arformoterol  15 mcg Nebulization BID  ? vitamin C  500 mg Oral Daily  ? budesonide (PULMICORT) nebulizer solution  0.5 mg Nebulization BID  ? buPROPion  150 mg Oral Daily  ? DULoxetine  30 mg Oral Daily  ? guaiFENesin  1,200 mg Oral BID  ? insulin aspart  0-15 Units Subcutaneous TID WC  ? insulin aspart  0-5 Units Subcutaneous QHS  ? insulin aspart  3 Units Subcutaneous TID WC  ? insulin glargine-yfgn  10 Units Subcutaneous Daily  ? levothyroxine  75 mcg Oral QAC breakfast  ? methylPREDNISolone (SOLU-MEDROL) injection  60 mg Intravenous Daily  ? metoprolol succinate  25 mg Oral Daily  ? nicotine  14 mg Transdermal Daily  ? pantoprazole  40 mg Oral Daily  ? pravastatin  40 mg Oral q1800  ? revefenacin  175 mcg Nebulization Daily  ? sodium chloride flush  3 mL Intravenous Q12H  ? zinc sulfate  220 mg Oral Daily  ? ?Continuous Infusions: ? ? ?PRN Meds: ?acetaminophen **OR** acetaminophen, albuterol, hydrALAZINE, methocarbamol, ondansetron **OR** ondansetron (ZOFRAN) IV, traMADol  ? ?Vital Signs  ?  ?Vitals:  ? 02/14/22 0810 02/14/22 1352 02/14/22 2051 02/15/22 0530  ?BP: (!) 120/50   135/66  ?Pulse: (!) 52   (!) 57  ?Resp: 12   20  ?Temp: 97.6 ?F (36.4 ?C)   98.1 ?F (36.7 ?C)  ?TempSrc: Oral   Oral  ?SpO2: 100% 92% 92% 100%  ?Weight:    73.2 kg  ?Height:      ? ? ?Intake/Output Summary (Last 24 hours) at 02/15/2022 1027 ?Last data filed at 02/15/2022 0500 ?Gross per 24 hour  ?Intake --  ?Output 800 ml  ?Net -800 ml  ? ? ?Last 3 Weights 02/15/2022 02/14/2022 02/13/2022  ?Weight (lbs) 161 lb 6 oz 161 lb 2.5 oz 157 lb 11.2 oz  ?Weight (kg)  73.2 kg 73.1 kg 71.532 kg  ?   ? ?Telemetry  ?  ?NSR/SB- Personally Reviewed ? ?ECG  ?  ?No new tracing- Personally Reviewed ? ? ?Labs  ?  ?High Sensitivity Troponin:   ?Recent Labs  ?Lab 01/25/22 ?2356 01/26/22 ?0320  ?TROPONINIHS 29* 31*  ? ?   ?Chemistry ?Recent Labs  ?Lab 02/09/22 ?1522 02/09/22 ?1546 02/10/22 ?8413 02/11/22 ?2440 02/12/22 ?0221 02/13/22 ?1027 02/14/22 ?2536 02/15/22 ?0425  ?NA  --    < > 139 135   < > 131* 133* 135  ?K  --    < > 4.4 3.2*   < > 4.5 4.8 5.2*  ?CL  --    < > 103 102   < > 99 99 102  ?CO2  --    < > 27 25   < > 19* 23 22  ?GLUCOSE  --    < > 190* 140*   < > 177* 198* 214*  ?BUN  --    < > 25* 20   < > 31* 50* 59*  ?CREATININE  --    < > 1.18* 1.06*   < > 1.40*  1.65* 1.70*  ?CALCIUM  --    < > 8.8* 8.3*   < > 8.5* 9.1 9.1  ?MG 2.1  --   --   --   --   --   --   --   ?PROT  --   --  6.2* 6.0*  --   --   --   --   ?ALBUMIN  --   --  3.1* 2.7*  --   --   --   --   ?AST  --   --  32 23  --   --   --   --   ?ALT  --   --  20 19  --   --   --   --   ?ALKPHOS  --   --  34 58  --   --   --   --   ?BILITOT  --   --  1.2 0.7  --   --   --   --   ?GFRNONAA  --    < > 47* 53*   < > 38* 31* 30*  ?ANIONGAP  --    < > 9 8   < > _0 ? < > = values in this interval not displayed.  ? ?  ?Lipids No results for input(s): CHOL, TRIG, HDL, LABVLDL, LDLCALC, CHOLHDL in the last 168 hours.  ?Hematology ?Recent Labs  ?Lab 02/12/22 ?0221 02/13/22 ?0359 02/15/22 ?0425  ?WBC 8.0 8.0 10.4  ?RBC 4.30 4.41 4.42  ?HGB 14.4 14.8 14.6  ?HCT 42.8 43.6 45.0  ?MCV 99.5 98.9 101.8*  ?MCH 33.5 33.6 33.0  ?MCHC 33.6 33.9 32.4  ?RDW 17.3* 16.8* 16.8*  ?PLT 156 134* 196  ? ? ?Thyroid No results for input(s): TSH, FREET4 in the last 168 hours.  ?BNPNo results for input(s): BNP, PROBNP in the last 168 hours.  ?DDimer No results for input(s): DDIMER in the last 168 hours.  ? ?EXAM ?GEN: Elderly female, sitting in bed ?Neck: No JVD ?Cardiac: RRR, 2/6 systolic murmur  ?Respiratory: Diminished throughout ?GI: Soft,  nontender, non-distended  ?MS: No edema; No deformity. Warm ?Neuro:  Nonfocal  ?Psych: Normal affect   ? ?Radiology  ?  ?No results found. ? ?Cardiac Studies  ? ?TTE 11/04/21: ?IMPRESSIONS  ? 1. Left ventricular ejection fraction, by estimation, is 60 to 65%. The  ?left ventricle has normal function. The left ventricle has no regional  ?wall motion abnormalities. Left ventricular diastolic parameters were  ?normal.  ? 2. Right ventricular systolic function is mildly reduced. The right  ?ventricular size is mildly enlarged. There is severely elevated pulmonary  ?artery systolic pressure.  ? 3. Left atrial size was mildly dilated.  ? 4. Right atrial size was moderately dilated.  ? 5. The pericardial effusion is posterior to the left ventricle.  ? 6. The mitral valve is abnormal. Mild mitral valve regurgitation. No  ?evidence of mitral stenosis. Moderate mitral annular calcification.  ? 7. Significant pulmonary HTN based on TR velocity . Tricuspid valve  ?regurgitation is moderate.  ? 8. CW across AV not done Manual trace from AR tracing shows stable mean  ?gradient of 9 mmHg . The aortic valve is normal in structure. Aortic valve  ?regurgitation is mild. Mild aortic valve stenosis.  ? 9. The inferior vena cava is dilated in size with <50% respiratory  ?variability, suggesting right atrial pressure of 15 mmHg. ?  ?[place] 01/06/22: ?Hemodynamics (mmHg) ?RA 5 ?  RV 72/7 ?PA 68/27, mean 42 ?PCWP mean 9 ?Oxygen saturations: ?PA 62% ?AO 96% ?Cardiac Output (Fick) 3.38  ?Cardiac Index (Fick) 1.83 ?PVR 9.7 WU ?  ?CARDIAC CATH: 11/23/2021 ?1. Normal filling pressures.  ?2. Severe pulmonary arterial hypertension.  ?3. Low cardiac output (though oxygen saturation was also low) ?4. Nonobstructive mild CAD.  ? ?Patient Profile  ?   ?81 y.o. female with history of chronic hypoxic respiratory failure on 4-6L at home, IPD, chronic diastolic HF, Afib, history of submassive PE, HTN and prior tobacco use who presented to the ER with worsening  hypoxia and Afib with RVR with 5 second post-conversion pause for which Cardiology was consulted. Also notably COVID positive on admission. ? ?Assessment & Plan  ?  ?#Paroxysmal atrial fibrillation: ?Very limited medication options. Discussions held previously with EP and primary Cardiologist with the decision made to load with amiodarone despite known lung disease in order to try to maintain sinus rhythm.  ?-Continue amiodarone 400 mg twice a day for 1 week, then 200 mg twice a day for 1 week, and then 200 mg a day thereafter ?- At some point consider 100 mg of amiodarone, lowest dose possible to help maintain her normal rhythm. ?- Continue metop 60m XL daily ?- Continue apixabam 570mbID ? 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 RHC with severe pulmonary HTN with mPAP 4251m, PCWP 9. On chronic 4-6 L O2 at home that has been steadily increasing to 9-12L. Has been seen by palliative and remains full code with full scope of medical care. Pulm following. ?-Management per Pulm and primary ?-Palliative consulted on multiple admissions and patient remains full code with full scope of care ? ?#Chronic diastolic heart failure ?EF 60-65%. Mainly with cor pulmonale with severe pulmonary HTN. Was diuresed with lasix IV now holding given rising Cr. ?-Hold torsemide given AKI ?-Low Na diet ?-Monitor I/Os and daily weights ? ?#COVID ?- On molnupiravir per primary team ? ?#AKI on CKD IIIa: ?Cr began to increase with resuming torsemide. Will hold for now. ?-Hold diuretics for now ?-Trend ? ? ?For questions or updates, please contact [facility] consult [email or web address] for contact info under  ? ?  ?   ?Signed, ?HeaFreada BergeronD  ?02/15/2022, 10:27 AM   ? ?

## 2022-02-15 NOTE — Assessment & Plan Note (Addendum)
Patient with viral pneumonia and worsening respiratory failure with hypoxemia. Acute on chronic hypoxemic respiratory failure.  Completed antiviral therapy   On IV systemic steroids 40 mg methylprednisolone.  Continue with inhaled corticosteroids.  Continue with bronchodilator therapy and will add mucolytic therapy.  Out of bed to chair tid with meals.  Continue with airway clearing techniques with flutter valve and incentive spirometer.   Now patient of respiratory isolation, more than 10 days with no signs of active viral activity.

## 2022-02-15 NOTE — Progress Notes (Signed)
? ?NAME:  Elizabeth Melton, MRN:  [id number], DOB:  May 13, 1941, LOS: 5 ?ADMISSION DATE:  02/09/2022, CONSULTATION DATE:  02/12/22 ?REFERRING MD:  Broadus John - [facility], CHIEF COMPLAINT:  Acute on chronic hypoxia   ? ?History of Present Illness:  ?81 yo F PMH chronic hypoxic respiratory failure (uses 4-6L at baseline), IPF, chronic diastolic HF, pulmonary hypertension, Afib, submassive PE, HTN, prior tobacco use who presented to ED 3/2 with increased HR and hypoxia after being seen by home health nurse. In ED, had converted out of Afib RVR to NSR, after a 5 second pause. She was hypoxic in ED requiring NRB. Admitted to [facility].  ?Hospital course marked by recurrent Afib RVR for which cardiology has been consulted, as well as increasing O2 requirement. She has been diuresed per cardiology guidance, and a CT chest which did not show significant progression of fibrosis compared to most recent imaging -- O2 requirement remains elevated, requiring 10L HFNC 3/5 ? ?PCCM is consulted in this setting ? ?Of note this is the patient's 3rd admission in 2023.  Palliative medicine saw her on 2/17 and the patient's family requested full scope of practice.  ? ?She tells me that she has had worsening cough and mucus production over the last few days.  Typically she will cough up a small amount of sputum in the mornings, but lately it has been more voluminous, darker, and bloodier than normal.  Also with increased dyspnea and wheezing.  Minimal leg swelling.  ? ?She is fully unvaccinated against COVID-19.  ? ?Pertinent  Medical History  ?IPF ?Chronic diastolic HF ?COPD ?Pulmonary hypertension  ?Submassive PE  ?DM2 ?Afib ?Hypothyroidism  ?CKD IIIA ?OSA ? ?Significant Hospital Events: ?Including procedures, antibiotic start and stop dates in addition to other pertinent events   ?3/2 to ED with incr HR and hypoxia after [facility] visit. Admit to [facility]. Cards consulted. Metop dc ?3/3 back in Afib -- started on IV amio. Restarted metop. Palli consult, full code   ?3/4 worse resp effort. Diuresed. Back in sinus ?3/5 pulm consult. Started demadex ? ?Interim History / Subjective:  ?No acute issues. Dyspneic this morning while taking medications. ? ?Objective   ?Blood pressure 135/66, pulse (!) 57, temperature 98.1 ?F (36.7 ?C), temperature source Oral, resp. rate 20, height _0  (1.676 m), weight 73.2 kg, SpO2 100 %. ?   ?   ? ?Intake/Output Summary (Last 24 hours) at 02/15/2022 1027 ?Last data filed at 02/15/2022 0500 ?Gross per 24 hour  ?Intake --  ?Output 800 ml  ?Net -800 ml  ? ?Filed Weights  ? 02/13/22 0605 02/14/22 0400 02/15/22 0530  ?Weight: 71.5 kg 73.1 kg 73.2 kg  ? ? ?Examination: ?General:  Chronically ill appearing, inclined in bed ?HENT: NCAT OP clear ?PULM: crackles bl, rhonchi, mildly increased effort ?CV: RRR, +systolic murmur ?GI: soft, nontender ?Derm: minimal edema in legs ?Neuro: awake, alert, no distress, MAEW ? ? ?Labs, imaging reviewed ? ?Resolved Hospital Problem list   ? ? ?Assessment & Plan:  ? ?Acute on chronic hypoxemic respiratory failure ?COPD with exacerbation ?COVID -19 pneumonia ?IPF ?pHTN ?Diastolic HF ?AKI ?OSA ?Unvaccinated against COVID 19 ? ?Discussion: ?Most likely she is having COPD exacerbation secondary to COVID-19 infection, covid-19 pneumonia. This likely lead to the atrial fibrillation.  Does not seem to have extent of widespread ggo that I would expect for ILD flare and timing doesn't necessarily fit amiodarone tox. ? ?Plan: ?- would probably hold torsemide tomorrow but defer to cardiology, would also check UA,  renal US in setting AKI ?- PT/mobilize ?- Solumedrol 60 mg daily IV, start solumedrol 40 mg IV tomorrow, will need at least 10d course of steroids for covid-19 ?- Brovana, budesonide, yupelri ?- completed course of molnupiravir ?- Guaifenesin 1218m bid ?- Flutter/incentive to continue at least 10 slow puffs BID ?- Wean off O2 for O2 saturation > 92%. Currently on 7.5L for sats 91 while eating. ?- Amiodarone, anticoagulation,  diuresis per cardiology ?- Would hold off on starting inhaled pulmonary vasodilators as of yet  ?- she has not been approved for this as an outpatient ?- Appreciate palliative evaluation ?- On discharge continue Breztri 2 puffs twice daily for inhaled therapies. Stop Stiolto per pulmonary clinic note.  ? ?N[place] ?[place]? ?See Amion for personal pager ?PCCM on call pager ([phone] 7pm. ?Please call Elink 7p-7a. [phone]? ?02/15/2022 10:27 AM ? ? ? ? ?

## 2022-02-15 NOTE — Progress Notes (Signed)
? ?Progress Note ? ?Patient Name: Elizabeth Melton ?Date of Encounter: 02/15/2022 ? ?[facility] Cardiologist: [name], MD  ? ?Subjective  ? ?Patient states she overall feels okay. No chest pain. Breathing stable.  ? ?Cr 1.7>1.79 (received torsemide 34m yesterday) ? ? ?Inpatient Medications  ?  ?Scheduled Meds: ? amiodarone  400 mg Oral BID  ? apixaban  5 mg Oral BID  ? arformoterol  15 mcg Nebulization BID  ? vitamin C  500 mg Oral Daily  ? budesonide (PULMICORT) nebulizer solution  0.5 mg Nebulization BID  ? buPROPion  150 mg Oral Daily  ? DULoxetine  30 mg Oral Daily  ? guaiFENesin  1,200 mg Oral BID  ? insulin aspart  0-15 Units Subcutaneous TID WC  ? insulin aspart  0-5 Units Subcutaneous QHS  ? insulin aspart  3 Units Subcutaneous TID WC  ? insulin glargine-yfgn  10 Units Subcutaneous Daily  ? levothyroxine  75 mcg Oral QAC breakfast  ? [START ON 02/16/2022] methylPREDNISolone (SOLU-MEDROL) injection  40 mg Intravenous Daily  ? metoprolol succinate  25 mg Oral Daily  ? nicotine  14 mg Transdermal Daily  ? pantoprazole  40 mg Oral Daily  ? pravastatin  40 mg Oral q1800  ? revefenacin  175 mcg Nebulization Daily  ? sodium chloride flush  3 mL Intravenous Q12H  ? zinc sulfate  220 mg Oral Daily  ? ?Continuous Infusions: ? ? ?PRN Meds: ?acetaminophen **OR** acetaminophen, albuterol, hydrALAZINE, methocarbamol, ondansetron **OR** ondansetron (ZOFRAN) IV, traMADol  ? ?Vital Signs  ?  ?Vitals:  ? 02/15/22 1240 02/15/22 1700 02/15/22 1745 02/15/22 2009  ?BP: (!) 141/54 (!) 147/52 (!) 147/52   ?Pulse: 64 (!) 59 61   ?Resp: _0 ?Temp: 98 ?F (36.7 ?C) 97.6 ?F (36.4 ?C) 98.7 ?F (37.1 ?C)   ?TempSrc: Oral  Oral   ?SpO2: 90% 90% 95% 98%  ?Weight:      ?Height:      ? ? ?Intake/Output Summary (Last 24 hours) at 02/15/2022 2010 ?Last data filed at 02/15/2022 1300 ?Gross per 24 hour  ?Intake 560 ml  ?Output 700 ml  ?Net -140 ml  ? ? ?Last 3 Weights 02/15/2022 02/14/2022 02/13/2022  ?Weight (lbs) 161 lb 6 oz 161  lb 2.5 oz 157 lb 11.2 oz  ?Weight (kg) 73.2 kg 73.1 kg 71.532 kg  ?   ?EXAM ?GEN: Elderly female, sitting up in a chair  ?Neck: No JVD ?Cardiac: RRR, 2/6 systolic murmur  ?Respiratory: Diminished throughout ?GI: Soft, nontender, non-distended  ?MS: No edema; No deformity. ?Neuro:  Nonfocal  ?Psych: Normal affect   ? ?Telemetry  ?  ?NSR- Personally Reviewed ? ?ECG  ?  ?No new tracing today- Personally Reviewed ? ? ?Labs  ?  ?High Sensitivity Troponin:   ?Recent Labs  ?Lab 01/25/22 ?2356 01/26/22 ?0320  ?TROPONINIHS 29* 31*  ? ?   ?Chemistry ?Recent Labs  ?Lab 02/09/22 ?1522 02/09/22 ?1546 02/10/22 ?0099803/04/23 ?0338203/05/23 ?0221 02/13/22 ?0505303/07/23 ?0976703/08/23 ?0425  ?NA  --    < > 139 135   < > 131* 133* 135  ?K  --    < > 4.4 3.2*   < > 4.5 4.8 5.2*  ?CL  --    < > 103 102   < > 99 99 102  ?CO2  --    < > 27 25   < > 19* 23 22  ?GLUCOSE  --    < >  190* 140*   < > 177* 198* 214*  ?BUN  --    < > 25* 20   < > 31* 50* 59*  ?CREATININE  --    < > 1.18* 1.06*   < > 1.40* 1.65* 1.70*  ?CALCIUM  --    < > 8.8* 8.3*   < > 8.5* 9.1 9.1  ?MG 2.1  --   --   --   --   --   --   --   ?PROT  --   --  6.2* 6.0*  --   --   --   --   ?ALBUMIN  --   --  3.1* 2.7*  --   --   --   --   ?AST  --   --  32 23  --   --   --   --   ?ALT  --   --  20 19  --   --   --   --   ?ALKPHOS  --   --  35 58  --   --   --   --   ?BILITOT  --   --  1.2 0.7  --   --   --   --   ?GFRNONAA  --    < > 47* 53*   < > 38* 31* 30*  ?ANIONGAP  --    < > 9 8   < > _0 ? < > = values in this interval not displayed.  ? ?  ?Lipids No results for input(s): CHOL, TRIG, HDL, LABVLDL, LDLCALC, CHOLHDL in the last 168 hours.  ?Hematology ?Recent Labs  ?Lab 02/12/22 ?0221 02/13/22 ?0359 02/15/22 ?0425  ?WBC 8.0 8.0 10.4  ?RBC 4.30 4.41 4.42  ?HGB 14.4 14.8 14.6  ?HCT 42.8 43.6 45.0  ?MCV 99.5 98.9 101.8*  ?MCH 33.5 33.6 33.0  ?MCHC 33.6 33.9 32.4  ?RDW 17.3* 16.8* 16.8*  ?PLT 156 134* 196  ? ? ?Thyroid No results for input(s): TSH, FREET4 in the last  168 hours.  ?BNPNo results for input(s): BNP, PROBNP in the last 168 hours.  ?DDimer No results for input(s): DDIMER in the last 168 hours.  ? ?Radiology  ?  ?No results found. ? ?Cardiac Studies  ? ?TTE 11/04/21: ?IMPRESSIONS  ? 1. Left ventricular ejection fraction, by estimation, is 60 to 65%. The  ?left ventricle has normal function. The left ventricle has no regional  ?wall motion abnormalities. Left ventricular diastolic parameters were  ?normal.  ? 2. Right ventricular systolic function is mildly reduced. The right  ?ventricular size is mildly enlarged. There is severely elevated pulmonary  ?artery systolic pressure.  ? 3. Left atrial size was mildly dilated.  ? 4. Right atrial size was moderately dilated.  ? 5. The pericardial effusion is posterior to the left ventricle.  ? 6. The mitral valve is abnormal. Mild mitral valve regurgitation. No  ?evidence of mitral stenosis. Moderate mitral annular calcification.  ? 7. Significant pulmonary HTN based on TR velocity . Tricuspid valve  ?regurgitation is moderate.  ? 8. CW across AV not done Manual trace from AR tracing shows stable mean  ?gradient of 9 mmHg . The aortic valve is normal in structure. Aortic valve  ?regurgitation is mild. Mild aortic valve stenosis.  ? 9. The inferior vena cava is dilated in size with <50% respiratory  ?variability, suggesting right atrial pressure of 15 mmHg. ?  ?[place] 01/06/22: ?Hemodynamics (  mmHg) ?RA 5 ?RV 72/7 ?PA 68/27, mean 42 ?PCWP mean 9 ?Oxygen saturations: ?PA 62% ?AO 96% ?Cardiac Output (Fick) 3.38  ?Cardiac Index (Fick) 1.83 ?PVR 9.7 WU ?  ?CARDIAC CATH: 11/23/2021 ?1. Normal filling pressures.  ?2. Severe pulmonary arterial hypertension.  ?3. Low cardiac output (though oxygen saturation was also low) ?4. Nonobstructive mild CAD.  ? ?Patient Profile  ?   ?81 y.o. female with history of chronic hypoxic respiratory failure on 4-6L at home, IPD, chronic diastolic HF, Afib, history of submassive PE, HTN and prior tobacco use  who presented to the ER with worsening hypoxia and Afib with RVR with 5 second post-conversion pause for which Cardiology was consulted. Also notably COVID positive on admission. ? ?Assessment & Plan  ?  ?#Paroxysmal atrial fibrillation: ?Very limited medication options. Discussions held previously with EP and primary Cardiologist with the decision made to load with amiodarone despite known lung disease in order to try to maintain sinus rhythm.  ?-Continue amiodarone 400 mg twice a day for 1 week, then 200 mg twice a day for 1 week, and then 200 mg a day thereafter ?- At some point consider 100 mg of amiodarone, lowest dose possible to help maintain her normal rhythm. ?- Continue metop 80m XL daily ?- Continue apixabam 568mbID ? 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 RHC with severe pulmonary HTN with mPAP 4273m, PCWP 9. On chronic 4-6 L O2 at home that has been steadily increasing to 9-12L. Has been seen by palliative and remains full code with full scope of medical care. Pulm following. ?-Management per Pulm and primary ?-Palliative consulted on multiple admissions and patient remains full code with full scope of care ? ?#Chronic diastolic heart failure ?EF 60-65%. Mainly with cor pulmonale with severe pulmonary HTN. Was diuresed with lasix IV now holding given rising Cr. ?-Hold torsemide given AKI ?-Will repeat BNP; does not appear acutely decompensated on exam ?-Low Na diet ?-Monitor I/Os and daily weights ? ?#COVID ?- On molnupiravir per primary team ? ?#AKI on CKD IIIa: ?Cr began to increase with resuming torsemide. Will hold for now. ?-Hold torsemide for now ? ? ?For questions or updates, please contact [facility] consult [email or web address] for contact info under  ? ?  ?   ?Signed, ?HeaFreada BergeronD  ?02/15/2022, 8:10 PM   ? ?

## 2022-02-15 NOTE — Assessment & Plan Note (Addendum)
Hyperkalemia, hyponatremia   Patient responded well to diuresis with improvement in volume status. At discharge her renal function has a serum cr of 1,20 with K at 4,9 and serum bicarbonate at 24.  Plan to continue diuresis with furosemide and follow up renal function as outpatient.

## 2022-02-16 DIAGNOSIS — N179 Acute kidney failure, unspecified: Secondary | ICD-10-CM | POA: Diagnosis not present

## 2022-02-16 DIAGNOSIS — J849 Interstitial pulmonary disease, unspecified: Secondary | ICD-10-CM | POA: Diagnosis not present

## 2022-02-16 DIAGNOSIS — U071 COVID-19: Secondary | ICD-10-CM | POA: Diagnosis not present

## 2022-02-16 DIAGNOSIS — I455 Other specified heart block: Secondary | ICD-10-CM | POA: Diagnosis not present

## 2022-02-16 DIAGNOSIS — I4891 Unspecified atrial fibrillation: Secondary | ICD-10-CM | POA: Diagnosis not present

## 2022-02-16 DIAGNOSIS — I10 Essential (primary) hypertension: Secondary | ICD-10-CM | POA: Diagnosis not present

## 2022-02-16 DIAGNOSIS — I480 Paroxysmal atrial fibrillation: Secondary | ICD-10-CM | POA: Diagnosis not present

## 2022-02-16 LAB — BASIC METABOLIC PANEL
Anion gap: 9 (ref 5–15)
BUN: 67 mg/dL — ABNORMAL HIGH (ref 8–23)
CO2: 23 mmol/L (ref 22–32)
Calcium: 9 mg/dL (ref 8.9–10.3)
Chloride: 104 mmol/L (ref 98–111)
Creatinine, Ser: 1.79 mg/dL — ABNORMAL HIGH (ref 0.44–1.00)
GFR, Estimated: 28 mL/min — ABNORMAL LOW (ref >60)
Glucose, Bld: 252 mg/dL — ABNORMAL HIGH (ref 70–99)
Potassium: 5 mmol/L (ref 3.5–5.1)
Sodium: 136 mmol/L (ref 135–145)

## 2022-02-16 LAB — GLUCOSE, CAPILLARY
Glucose-Capillary: 257 mg/dL — ABNORMAL HIGH (ref 70–99)
Glucose-Capillary: 177 mg/dL — ABNORMAL HIGH (ref 70–99)
Glucose-Capillary: 174 mg/dL — ABNORMAL HIGH (ref 70–99)
Glucose-Capillary: 185 mg/dL — ABNORMAL HIGH (ref 70–99)

## 2022-02-16 LAB — C-REACTIVE PROTEIN: CRP: 1.6 mg/dL — ABNORMAL HIGH (ref <1.0)

## 2022-02-16 LAB — FERRITIN: Ferritin: 513 ng/mL — ABNORMAL HIGH (ref 11–307)

## 2022-02-16 LAB — D-DIMER, QUANTITATIVE: D-Dimer, Quant: 0.6 ug/mL-FEU — ABNORMAL HIGH (ref 0.00–0.50)

## 2022-02-16 LAB — BRAIN NATRIURETIC PEPTIDE: B Natriuretic Peptide: 1083.1 pg/mL — ABNORMAL HIGH (ref 0.0–100.0)

## 2022-02-16 MED ORDER — PREDNISONE 20 MG PO TABS
40.0000 mg | ORAL_TABLET | Freq: Every day | ORAL | Status: DC
  Administered 2022-02-17: 40 mg via ORAL
  Filled 2022-02-16: qty 2

## 2022-02-16 NOTE — Progress Notes (Signed)
Physical Therapy Treatment ?Patient Details ?Name: Elizabeth Melton ?MRN: [id number] ?DOB: 11-04-1941 ?Today's Date: 02/16/2022 ? ? ?History of Present Illness 81 y.o. female admitted 3/2 with SOB. Pt with AFib, COPD exacerbation and Covid (+). PMhx: interstitial pulmonary fibrosis, pulm HTN, chronic respiratory failure on 4 -5 L home O2, dCHF, DM, CKD, PAD, HTN, hypothyroidism, OSA on CPAP, paroxysmal A-fib, PE on Eliquis ? ?  ?PT Comments  ? ? Pt progressing with gait tolerance with decreased O2 demand on 8L and tolerating seated HEP. Pt encouraged to utilize [facility] over purewick and continue mobility OOB to chair daily. Pt reports she has discussed ambulance transport home for ascending stairs if not strong enough to complete prior to D/C.  ?   ?Recommendations for follow up therapy are one component of a multi-disciplinary discharge planning process, led by the attending physician.  Recommendations may be updated based on patient status, additional functional criteria and insurance authorization. ? ?Follow Up Recommendations ? Home health PT ?  ?  ?Assistance Recommended at Discharge Frequent or constant Supervision/Assistance  ?Patient can return home with the following A little help with walking and/or transfers;A little help with bathing/dressing/bathroom;Assistance with cooking/housework;Help with stairs or ramp for entrance;Assist for transportation ?  ?Equipment Recommendations ? None recommended by PT  ?  ?Recommendations for Other Services   ? ? ?  ?Precautions / Restrictions Precautions ?Precautions: Fall ?Precaution Comments: monitor O2-  ?  ? ?Mobility ? Bed Mobility ?Overal bed mobility: Modified Independent ?Bed Mobility: Sit to Supine ?  ?  ?  ?  ?  ?  ?  ? ?Transfers ?Overall transfer level: Needs assistance ?  ?Transfers: Sit to/from Stand ?Sit to Stand: Supervision ?  ?  ?  ?  ?  ?General transfer comment: supervision for lines and safety ?  ? ?Ambulation/Gait ?Ambulation/Gait assistance: Min  guard ?Gait Distance (Feet): 72 Feet ?Assistive device: Rolling walker (2 wheels) ?Gait Pattern/deviations: Step-through pattern, Decreased stride length, Trunk flexed ?  ?Gait velocity interpretation: <1.8 ft/sec, indicate of risk for recurrent falls ?  ?General Gait Details: cue for posture and breathing technique. brief standing rest half way through gait with pt able to direct distance. Pt on 8L with sats 87-93% during gait ? ? ?Stairs ?  ?  ?  ?  ?  ? ? ?Wheelchair Mobility ?  ? ?Modified Rankin (Stroke Patients Only) ?  ? ? ?  ?Balance Overall balance assessment: Needs assistance ?Sitting-balance support: Feet supported ?Sitting balance-Leahy Scale: Good ?  ?  ?Standing balance support: During functional activity, Bilateral upper extremity supported ?Standing balance-Leahy Scale: Fair ?Standing balance comment: RW for gait ?  ?  ?  ?  ?  ?  ?  ?  ?  ?  ?  ?  ? ?  ?Cognition Arousal/Alertness: Awake/alert ?Behavior During Therapy: [facility] for tasks assessed/performed ?Overall Cognitive Status: Within Functional Limits for tasks assessed ?  ?  ?  ?  ?  ?  ?  ?  ?  ?  ?  ?  ?  ?  ?  ?  ?  ?  ?  ? ?  ?Exercises General Exercises - Lower Extremity ?Long Arc Quad: AROM, Both, 15 reps, Seated ?Hip Flexion/Marching: AROM, Both, Seated, 15 reps ? ?  ?General Comments   ?  ?  ? ?Pertinent Vitals/Pain Pain Assessment ?Pain Assessment: No/denies pain  ? ? ?Home Living   ?  ?  ?  ?  ?  ?  ?  ?  ?  ?   ?  ?  Prior Function    ?  ?  ?   ? ?PT Goals (current goals can now be found in the care plan section) Progress towards PT goals: Progressing toward goals ? ?  ?Frequency ? ? ? Min 3X/week ? ? ? ?  ?PT Plan Current plan remains appropriate  ? ? ?Co-evaluation   ?  ?  ?  ?  ? ?  ?AM-PAC PT "6 Clicks" Mobility   ?Outcome Measure ? Help needed turning from your back to your side while in a flat bed without using bedrails?: None ?Help needed moving from lying on your back to sitting on the side of a flat bed without using bedrails?: A  Little ?Help needed moving to and from a bed to a chair (including a wheelchair)?: A Little ?Help needed standing up from a chair using your arms (e.g., wheelchair or bedside chair)?: A Little ?Help needed to walk in hospital room?: A Little ?Help needed climbing 3-5 steps with a railing? : A Lot ?6 Click Score: 18 ? ?  ?End of Session Equipment Utilized During Treatment: Oxygen ?Activity Tolerance: Patient tolerated treatment well ?Patient left: in bed;with call bell/phone within reach ?Nurse Communication: Mobility status ?PT Visit Diagnosis: Muscle weakness (generalized) (M62.81);Difficulty in walking, not elsewhere classified (R26.2);Other abnormalities of gait and mobility (R26.89) ?  ? ? ?Time: 8347-5830 ?PT Time Calculation (min) (ACUTE ONLY): 29 min ? ?Charges:  $Gait Training: 8-22 mins ?$Therapeutic Activity: 8-22 mins          ?          ? ?Letricia Krinsky P, PT ?Acute Rehabilitation Services ?Pager: [phone] ?Office: [phone] ? ? ? ?[name] ?02/16/2022, 1:59 PM ? ?

## 2022-02-16 NOTE — Care Management Important Message (Signed)
Important Message ? ?Patient Details  ?Name: Elizabeth Melton ?MRN: [id number] ?Date of Birth: [birth year] ? ? ?Medicare Important Message Given:  Yes ? ? ? ? ?[name] ?02/16/2022, 8:29 AM ?

## 2022-02-16 NOTE — Progress Notes (Addendum)
?Progress Note ? ? ?Patient: Elizabeth Melton LFY:[id number] DOB: 11-29-1941 DOA: 02/09/2022     6 ?DOS: the patient was seen and examined on 02/16/2022 ?  ?Brief hospital course: ?Elizabeth Melton was admitted to the hospital with the working diagnosis of acute on chronic hypoxemic respiratory failure due to decompensated heart failure in the setting of pulmonary fibrosis.  ? ?81 yo female with the past medical history of IPF, COPD, heart failure, pulmonary hypertension, atrial fibrillation, T2DM, pulmonary embolism and chronic hypoxemic respiratory failure who presented with dyspnea. At home patient had worsening dyspnea, worsening oxygenation despite supplemental 02. Home nurse called EMS, his heart rate was greater than 200 (atrial fibrillation) and patient was transported to the hospital. At the time of his arrival his heart rate improved to 90, blood pressure 112/51, rr 10 and 02 saturation 96%, lungs with diffuse rhonchi but no wheezing, heart with S1 and S2 present and rhythmic, no gallops, abdomen soft and no lower extremity edema. ? ?Na 140, K 4,3 Cl 111, bicarb 24 glucose 126, bun 55 and cr 2,8 ?Ca I 1,43  ?Wbc 10,2 hgb 13,9 hct 43, 5 plt 162  ?SARS covid 19 positive  ? ?Chest radiograph with bilateral increased lung markings, enlarged right ventricle/ right atrium.  ? ?CT chest with emphysema and interseptal thickening, with honey combing, consistent with pulmonary fibrosis.  ?9 mm right upper outer breat mass.  ?Similar multiple pulmonary nodules.  ? ?EKG 91 bpm, normal axis, normal intervals, sinus rhythm with right atrial enlargement, no significant ST segment or T wave changes.  ? ?Started on steroids and malnupiravir for COVID 19 infection ? ?03/03 patient had recurrent atrial fibrillation with RVR and was placed on amiodarone infusion. ? ? ? ? ?Assessment and Plan: ?* PAF (paroxysmal atrial fibrillation) ([place]) ?Her heart rate is down to 40 to 50 atrial fibrillation.  ? ?Continue with amiodarone  ?Close  monitoring of pulmonary function.  ?Continue telemetry monitoring. ?Anticoagulation with apixaban.  ?Hold on metoprolol to prevent further worsening bradycardia, heart rate has been in the 40's ? ?Pneumonia due to COVID-19 virus ?Patient with viral pneumonia and worsening respiratory failure with hypoxemia. ?Acute on chronic hypoxemic respiratory failure.  ? ?RR: 19  ?Pulse oxymetry: 92 to 95  ?Fi02: on 7 L/min per HFNC  ? ?COVID-19 Labs ? ?Recent Labs  ?  02/14/22 ?0749 02/15/22 ?0425 02/16/22 ?0450  ?DDIMER  --   --  0.60*  ?FERRITIN 498* 490* 513*  ?CRP 5.1* 2.5* 1.6*  ? ? ?Lab Results  ?Component Value Date  ? SARSCOV2NAA POSITIVE (A) 02/09/2022  ? Elizabeth Melton NEGATIVE 01/25/2022  ? Elizabeth Melton NEGATIVE 01/12/2022  ? Elizabeth Melton NEGATIVE 12/28/2021  ? ? ?CRP is trending down.  ?Patient has completed antiviral therapy. ?Plan to continue with systemic steroids with long taper. ?Continue with bronchodilator therapy. ?Out of bed to chair tid with meals. ?Patient at home on 6 L/min per [place] supplemental 02.  ? ? ?ILD (interstitial lung disease) ([place]) ?Acute on chronic hypoxemic respiratory failure. ?Oxygenation is 91% on 8 L /min per HFNC. ? ?Systemic and inhaled corticosteroids.  ? ?On bronchodilator therapy and airway clearing techniques.  ? ?Continue with Tyvaso (family asked to bring that in) ? ? ?Acute kidney injury superimposed on chronic kidney disease ([place]) ?hyperkalemia ?Worsening renal function with serum cr at 1,79, K is 5,0 and serum bicarbonate at 23. ? ?Plan to hold on diuresis for now and follow up renal function in am ?Avoid hypotension and nephrotoxic medications.  ? ?Essential (  primary) hypertension ?Blood pressure has been stable, ?Noted bradycardia.  ? ?Type 2 diabetes mellitus with hyperlipidemia ([place]) ?Uncontrolled hyperglycemia.  ? ?Fasting glucose this am 252  ?Plan to continue glucose cover and monitoring with insulin sliding scale. ?Continue pre- meal and basal insulin.  ?Continue statin  therapy.  ? ?Mood disorder ([place]) ?On Wellbutrin, Cymbalta ? ?Hypothyroidism ?On levothyroxine  ? ?Hyperlipidemia/PAD ?-Continue Mevacor (pravastatin per formulary) ? ?Tobacco abuse ?Continue with nicotine patch. ? ? ?Breast mass ?Incidentally noted on CT chest, will need mammogram as outpatient when stable ? ? ? ? ?  ? ?Subjective: Patient with improvement of dyspnea but not back to baseline, no chest pain and tolerating po well.  ? ?Physical Exam: ?Vitals:  ? 02/16/22 0500 02/16/22 0510 02/16/22 0700 02/16/22 1110  ?BP:  (!) 121/56 125/65 (!) 142/60  ?Pulse:  (!) 51  (!) 54  ?Resp:  _0 ?Temp:  98.2 ?F (36.8 ?C) 97.7 ?F (36.5 ?C) 97.9 ?F (36.6 ?C)  ?TempSrc:  Oral Oral Oral  ?SpO2:  94% 97% 99%  ?Weight: 73.5 kg     ?Height:      ? ?Neurology awake and alert ?ENT with no pallor ?Cardiovascular with S1 and S2 present irregularly irregular, with positive bradycardia, no rubs or murmurs ?Positive JVD ?Trace lower extremity edema ?Respiratory with scattered rales bilaterally, no wheezing or rhonchi ?Abdomen soft  ?Data Reviewed: ? ? ? ?Family Communication: no family at the bedside  ? ?Disposition: ?Status is: Inpatient ?Remains inpatient appropriate because: respiratory failure  ? Planned Discharge Destination: Home ? ? ? ?Author: ?Tawni Millers, MD ?02/16/2022 4:28 PM ? ?For on call review [email or web address].  ?

## 2022-02-16 NOTE — Care Management Important Message (Signed)
Important Message ? ?Patient Details  ?Name: Elizabeth Melton ?MRN: [id number] ?Date of Birth: February 17, 1941 ? ? ?Medicare Important Message Given:  Yes ? ? ? ? ?[name] ?02/16/2022, 8:17 AM ?

## 2022-02-16 NOTE — Progress Notes (Signed)
? ?NAME:  Elizabeth Melton, MRN:  [id number], DOB:  [birth year], LOS: 6 ?ADMISSION DATE:  02/09/2022, CONSULTATION DATE:  02/12/22 ?REFERRING MD:  Broadus John - [facility], CHIEF COMPLAINT:  Acute on chronic hypoxia   ? ?History of Present Illness:  ?81 yo F PMH chronic hypoxic respiratory failure (uses 4-6L at baseline), IPF, chronic diastolic HF, pulmonary hypertension, Afib, submassive PE, HTN, prior tobacco use who presented to ED 3/2 with increased HR and hypoxia after being seen by home health nurse. In ED, had converted out of Afib RVR to NSR, after a 5 second pause. She was hypoxic in ED requiring NRB. Admitted to [facility].  ?Hospital course marked by recurrent Afib RVR for which cardiology has been consulted, as well as increasing O2 requirement. She has been diuresed per cardiology guidance, and a CT chest which did not show significant progression of fibrosis compared to most recent imaging -- O2 requirement remains elevated, requiring 10L HFNC 3/5 ? ?PCCM is consulted in this setting ? ?Of note this is the patient's 3rd admission in 2023.  Palliative medicine saw her on 2/17 and the patient's family requested full scope of practice.  ? ?She tells me that she has had worsening cough and mucus production over the last few days.  Typically she will cough up a small amount of sputum in the mornings, but lately it has been more voluminous, darker, and bloodier than normal.  Also with increased dyspnea and wheezing.  Minimal leg swelling.  ? ?She is fully unvaccinated against COVID-19.  ? ?Pertinent  Medical History  ?IPF ?Chronic diastolic HF ?COPD ?Pulmonary hypertension  ?Submassive PE  ?DM2 ?Afib ?Hypothyroidism  ?CKD IIIA ?OSA ? ?Significant Hospital Events: ?Including procedures, antibiotic start and stop dates in addition to other pertinent events   ?3/2 to ED with incr HR and hypoxia after [facility] visit. Admit to [facility]. Cards consulted. Metop dc ?3/3 back in Afib -- started on IV amio. Restarted metop. Palli consult, full code   ?3/4 worse resp effort. Diuresed. Back in sinus ?3/5 pulm consult. Started demadex ? ?Interim History / Subjective:  ?No acute issues, eager to go home. She claims she uses 7-8L O2 per [place] at home continuously at rest and with exertion and that she's been using this amount for months with home concentrator. I can't verify this in the chart though where 4-6L is documented. ? ?Objective   ?Blood pressure (!) 142/60, pulse (!) 54, temperature 97.9 ?F (36.6 ?C), temperature source Oral, resp. rate 19, height _0  (1.676 m), weight 73.5 kg, SpO2 99 %. ?   ?   ? ?Intake/Output Summary (Last 24 hours) at 02/16/2022 1215 ?Last data filed at 02/16/2022 1047 ?Gross per 24 hour  ?Intake 1200 ml  ?Output 400 ml  ?Net 800 ml  ? ?Filed Weights  ? 02/14/22 0400 02/15/22 0530 02/16/22 0500  ?Weight: 73.1 kg 73.2 kg 73.5 kg  ? ? ?Examination: ?General:  Chronically ill appearing, sitting up in chair ?HENT: NCAT OP clear ?PULM: crackles bl, rhonchi, mildly increased effort ?CV: RRR, +systolic murmur ?GI: soft, nontender ?Derm: minimal edema in legs ?Neuro: awake, alert, no distress, grossly nonfocal ? ? ?Labs, imaging reviewed ? ?Resolved Hospital Problem list   ? ? ?Assessment & Plan:  ? ?Acute on chronic hypoxemic respiratory failure ?COPD with exacerbation ?COVID -19 pneumonia ?IPF ?pHTN ?Diastolic HF ?AKI ?OSA ?Unvaccinated against COVID 19 ? ?Discussion: ?Most likely she is having COPD exacerbation secondary to COVID-19 infection, covid-19 pneumonia. This likely lead to the  atrial fibrillation.  Does not seem to have extent of widespread ggo that I would expect for ILD flare and timing doesn't necessarily fit amiodarone tox. ? ?Plan: ?- holding torsemide tomorrow ?- PT/mobilize ?- prednisone 40 mg daily, will need at least 10d course of steroids for covid-19 (ordered with end date) ?- Brovana, budesonide, yupelri ?- completed course of molnupiravir ?- Guaifenesin 1269m bid ?- Flutter/incentive to continue at least 10 slow puffs  BID ?- Wean off O2 for O2 saturation > 92%. Currently on 7.5L for sats 91 while eating. ?- Amiodarone, anticoagulation, diuresis per cardiology ?- Would hold off on starting inhaled pulmonary vasodilators as of yet  ?- she has not been approved for this as an outpatient ?- Appreciate palliative evaluation ?- On discharge continue Breztri 2 puffs twice daily for inhaled therapies. Stop Stiolto per pulmonary clinic note.  ? ?Will follow peripherally over the weekend but available if any questions/concerns and can facilitate pulm follow up if she is discharged soon ? ?[place] ?[place]? ?See Amion for personal pager ?PCCM on call pager ([phone] 7pm. ?Please call Elink 7p-7a. [phone]? ?02/16/2022 12:15 PM ? ? ? ? ?

## 2022-02-16 NOTE — Plan of Care (Signed)
?  Problem: Education: ?Goal: Knowledge of disease or condition will improve ?Outcome: Progressing ?  ?Problem: Activity: ?Goal: Ability to tolerate increased activity will improve ?Outcome: Progressing ?  ?Problem: Clinical Measurements: ?Goal: Ability to maintain clinical measurements within normal limits will improve ?Outcome: Progressing ?  ?

## 2022-02-16 NOTE — Progress Notes (Signed)
? ?Progress Note ? ?Patient Name: Elizabeth Melton ?Date of Encounter: 02/17/2022 ? ?[facility] Cardiologist: [name], MD  ? ?Subjective  ? ?Feeling better today. Breathing stable. States she is constipated. ? ?Cr improved to 1.37 after holding torsemide ?BNP elevated at 1083; will need maintenance diuretic  ? ?Inpatient Medications  ?  ?Scheduled Meds: ? [START ON 02/19/2022] amiodarone  200 mg Oral Daily  ? amiodarone  400 mg Oral BID  ? apixaban  5 mg Oral BID  ? arformoterol  15 mcg Nebulization BID  ? vitamin C  500 mg Oral Daily  ? budesonide (PULMICORT) nebulizer solution  0.5 mg Nebulization BID  ? buPROPion  150 mg Oral Daily  ? DULoxetine  30 mg Oral Daily  ? guaiFENesin  1,200 mg Oral BID  ? insulin aspart  0-15 Units Subcutaneous TID WC  ? insulin aspart  0-5 Units Subcutaneous QHS  ? insulin aspart  3 Units Subcutaneous TID WC  ? insulin glargine-yfgn  10 Units Subcutaneous Daily  ? levothyroxine  75 mcg Oral QAC breakfast  ? nicotine  14 mg Transdermal Daily  ? pantoprazole  40 mg Oral Daily  ? pravastatin  40 mg Oral q1800  ? predniSONE  40 mg Oral Q breakfast  ? revefenacin  175 mcg Nebulization Daily  ? sodium chloride flush  3 mL Intravenous Q12H  ? zinc sulfate  220 mg Oral Daily  ? ?Continuous Infusions: ? ? ?PRN Meds: ?acetaminophen **OR** acetaminophen, albuterol, hydrALAZINE, methocarbamol, ondansetron **OR** ondansetron (ZOFRAN) IV, traMADol  ? ?Vital Signs  ?  ?Vitals:  ? 02/16/22 2102 02/17/22 0300 02/17/22 0629 02/17/22 0754  ?BP:   (!) 133/49 128/72  ?Pulse:   (!) 51 (!) 48  ?Resp: 17  16 (!) 21  ?Temp:   98.5 ?F (36.9 ?C) (!) 97.5 ?F (36.4 ?C)  ?TempSrc:   Oral   ?SpO2:  95% 94% 91%  ?Weight:   73 kg   ?Height:      ? ? ?Intake/Output Summary (Last 24 hours) at 02/17/2022 0803 ?Last data filed at 02/17/2022 3435 ?Gross per 24 hour  ?Intake 490 ml  ?Output 500 ml  ?Net -10 ml  ? ?Last 3 Weights 02/17/2022 02/16/2022 02/15/2022  ?Weight (lbs) 160 lb 15 oz 162 lb 0.6 oz 161 lb  6 oz  ?Weight (kg) 73 kg 73.5 kg 73.2 kg  ?   ?EXAM ?GEN: Elderly female, sitting up in a chair  ?Neck: No JVD ?Cardiac: RRR, 2/6 systolic murmur  ?Respiratory: Diminished throughout ?GI: Soft, nontender, non-distended  ?MS: No edema; No deformity. ?Neuro:  Nonfocal  ?Psych: Normal affect   ? ?Telemetry  ?  ?NSR/SB- Personally Reviewed ? ?ECG  ?  ?No new tracing today- Personally Reviewed ? ? ?Labs  ?  ?High Sensitivity Troponin:   ?Recent Labs  ?Lab 01/25/22 ?2356 01/26/22 ?0320  ?TROPONINIHS 29* 31*  ?   ?Chemistry ?Recent Labs  ?Lab 02/10/22 ?0942 02/11/22 ?6861 02/12/22 ?0221 02/15/22 ?0425 02/16/22 ?0450 02/17/22 ?6837  ?NA 139 135   < > 135 136 134*  ?K 4.4 3.2*   < > 5.2* 5.0 4.8  ?CL 103 102   < > 102 104 101  ?CO2 27 25   < > _0 ?GLUCOSE 190* 140*   < > 214* 252* 141*  ?BUN 25* 20   < > 59* 67* 52*  ?CREATININE 1.18* 1.06*   < > 1.70* 1.79* 1.37*  ?CALCIUM 8.8* 8.3*   < >  9.1 9.0 9.2  ?MG  --   --   --   --   --  2.5*  ?PROT 6.2* 6.0*  --   --   --   --   ?ALBUMIN 3.1* 2.7*  --   --   --   --   ?AST 32 23  --   --   --   --   ?ALT 20 19  --   --   --   --   ?ALKPHOS 62 58  --   --   --   --   ?BILITOT 1.2 0.7  --   --   --   --   ?GFRNONAA 47* 53*   < > 30* 28* 39*  ?ANIONGAP 9 8   < > _0 ? < > = values in this interval not displayed.  ?  ?Lipids No results for input(s): CHOL, TRIG, HDL, LABVLDL, LDLCALC, CHOLHDL in the last 168 hours.  ?Hematology ?Recent Labs  ?Lab 02/12/22 ?0221 02/13/22 ?0359 02/15/22 ?0425  ?WBC 8.0 8.0 10.4  ?RBC 4.30 4.41 4.42  ?HGB 14.4 14.8 14.6  ?HCT 42.8 43.6 45.0  ?MCV 99.5 98.9 101.8*  ?MCH 33.5 33.6 33.0  ?MCHC 33.6 33.9 32.4  ?RDW 17.3* 16.8* 16.8*  ?PLT 156 134* 196  ? ?Thyroid No results for input(s): TSH, FREET4 in the last 168 hours.  ?BNP ?Recent Labs  ?Lab 02/16/22 ?1046  ?BNP 1,083.1*  ?  ?DDimer  ?Recent Labs  ?Lab 02/16/22 ?0450 02/17/22 ?3734  ?DDIMER 0.60* 0.56*  ?  ? ?Radiology  ?  ?No results found. ? ?Cardiac Studies  ? ?TTE 11/04/21: ?IMPRESSIONS  ?  1. Left ventricular ejection fraction, by estimation, is 60 to 65%. The  ?left ventricle has normal function. The left ventricle has no regional  ?wall motion abnormalities. Left ventricular diastolic parameters were  ?normal.  ? 2. Right ventricular systolic function is mildly reduced. The right  ?ventricular size is mildly enlarged. There is severely elevated pulmonary  ?artery systolic pressure.  ? 3. Left atrial size was mildly dilated.  ? 4. Right atrial size was moderately dilated.  ? 5. The pericardial effusion is posterior to the left ventricle.  ? 6. The mitral valve is abnormal. Mild mitral valve regurgitation. No  ?evidence of mitral stenosis. Moderate mitral annular calcification.  ? 7. Significant pulmonary HTN based on TR velocity . Tricuspid valve  ?regurgitation is moderate.  ? 8. CW across AV not done Manual trace from AR tracing shows stable mean  ?gradient of 9 mmHg . The aortic valve is normal in structure. Aortic valve  ?regurgitation is mild. Mild aortic valve stenosis.  ? 9. The inferior vena cava is dilated in size with <50% respiratory  ?variability, suggesting right atrial pressure of 15 mmHg. ?  ?[place] 01/06/22: ?Hemodynamics (mmHg) ?RA 5 ?RV 72/7 ?PA 68/27, mean 42 ?PCWP mean 9 ?Oxygen saturations: ?PA 62% ?AO 96% ?Cardiac Output (Fick) 3.38  ?Cardiac Index (Fick) 1.83 ?PVR 9.7 WU ?  ?CARDIAC CATH: 11/23/2021 ?1. Normal filling pressures.  ?2. Severe pulmonary arterial hypertension.  ?3. Low cardiac output (though oxygen saturation was also low) ?4. Nonobstructive mild CAD.  ? ?Patient Profile  ?   ?81 y.o. female with history of chronic hypoxic respiratory failure on 4-6L at home, IPD, chronic diastolic HF, Afib, history of submassive PE, HTN and prior tobacco use who presented to the ER with worsening hypoxia and Afib with RVR with 5 second post-conversion  pause for which Cardiology was consulted. Also notably COVID positive on admission. ? ?Assessment & Plan  ?  ?#Paroxysmal atrial  fibrillation: ?Very limited medication options. Discussions held previously with EP and primary Cardiologist with the decision made to load with amiodarone despite known lung disease in order to try to maintain sinus rhythm.  ?-Continue amiodarone 400 mg twice a day for 1 week, then 200 mg twice a day for 1 week, and then 200 mg a day thereafter ?- At some point consider 100 mg of amiodarone, lowest dose possible to help maintain her normal rhythm. ?- Continue metop 34m XL daily ?- Continue apixaban 571mID ? 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 RHC with severe pulmonary HTN with mPAP 4252m, PCWP 9. On chronic 4-6 L O2 at home that has been steadily increasing to 9-12L. Has been seen by palliative and remains full code with full scope of medical care. Pulm following. ?-Management per Pulm and primary ?-Palliative consulted on multiple admissions and patient remains full code with full scope of care ? ?#Chronic diastolic heart failure ?EF 60-65%. Mainly with cor pulmonale with severe pulmonary HTN. Was diuresed with lasix IV now holding given rising Cr. ?-Cr improved with holding torsemide ?-Will likely need maintenance diuretic once Cr stabilizes ?-Low Na diet ?-Monitor I/Os and daily weights ? ?#COVID ?- On molnupiravir per primary team ? ?#AKI on CKD IIIa: ?Improved with holding diuretics. ?-Continue to hold torsemide for now ?-Will likely need maintenance diuretic once Cr stabilizes ? ? ?For questions or updates, please contact [place] consult [email or web address] for contact info under  ? ?  ?   ?Signed, ?HeaFreada BergeronD  ?02/17/2022, 8:03 AM   ? ?

## 2022-02-17 DIAGNOSIS — I480 Paroxysmal atrial fibrillation: Secondary | ICD-10-CM | POA: Diagnosis not present

## 2022-02-17 DIAGNOSIS — I4891 Unspecified atrial fibrillation: Secondary | ICD-10-CM | POA: Diagnosis not present

## 2022-02-17 DIAGNOSIS — I455 Other specified heart block: Secondary | ICD-10-CM | POA: Diagnosis not present

## 2022-02-17 DIAGNOSIS — J849 Interstitial pulmonary disease, unspecified: Secondary | ICD-10-CM | POA: Diagnosis not present

## 2022-02-17 DIAGNOSIS — I10 Essential (primary) hypertension: Secondary | ICD-10-CM | POA: Diagnosis not present

## 2022-02-17 DIAGNOSIS — U071 COVID-19: Secondary | ICD-10-CM | POA: Diagnosis not present

## 2022-02-17 DIAGNOSIS — N179 Acute kidney failure, unspecified: Secondary | ICD-10-CM | POA: Diagnosis not present

## 2022-02-17 LAB — GLUCOSE, CAPILLARY
Glucose-Capillary: 123 mg/dL — ABNORMAL HIGH (ref 70–99)
Glucose-Capillary: 273 mg/dL — ABNORMAL HIGH (ref 70–99)
Glucose-Capillary: 182 mg/dL — ABNORMAL HIGH (ref 70–99)
Glucose-Capillary: 224 mg/dL — ABNORMAL HIGH (ref 70–99)

## 2022-02-17 LAB — BASIC METABOLIC PANEL
Anion gap: 8 (ref 5–15)
BUN: 52 mg/dL — ABNORMAL HIGH (ref 8–23)
CO2: 25 mmol/L (ref 22–32)
Calcium: 9.2 mg/dL (ref 8.9–10.3)
Chloride: 101 mmol/L (ref 98–111)
Creatinine, Ser: 1.37 mg/dL — ABNORMAL HIGH (ref 0.44–1.00)
GFR, Estimated: 39 mL/min — ABNORMAL LOW (ref >60)
Glucose, Bld: 141 mg/dL — ABNORMAL HIGH (ref 70–99)
Potassium: 4.8 mmol/L (ref 3.5–5.1)
Sodium: 134 mmol/L — ABNORMAL LOW (ref 135–145)

## 2022-02-17 LAB — FERRITIN: Ferritin: 454 ng/mL — ABNORMAL HIGH (ref 11–307)

## 2022-02-17 LAB — D-DIMER, QUANTITATIVE: D-Dimer, Quant: 0.56 ug/mL-FEU — ABNORMAL HIGH (ref 0.00–0.50)

## 2022-02-17 LAB — MAGNESIUM: Magnesium: 2.5 mg/dL — ABNORMAL HIGH (ref 1.7–2.4)

## 2022-02-17 LAB — C-REACTIVE PROTEIN: CRP: 0.9 mg/dL (ref <1.0)

## 2022-02-17 MED ORDER — AMIODARONE HCL 200 MG PO TABS
200.0000 mg | ORAL_TABLET | Freq: Every day | ORAL | Status: DC
Start: 2022-02-19 — End: 2022-02-18

## 2022-02-17 MED ORDER — METHYLPREDNISOLONE SODIUM SUCC 40 MG IJ SOLR
40.0000 mg | INTRAMUSCULAR | Status: DC
  Administered 2022-02-18 – 2022-02-21 (×4): 40 mg via INTRAVENOUS
  Filled 2022-02-17 (×4): qty 1

## 2022-02-17 MED ORDER — POLYETHYLENE GLYCOL 3350 17 G PO PACK
17.0000 g | PACK | Freq: Two times a day (BID) | ORAL | Status: DC
  Administered 2022-02-17 – 2022-02-21 (×7): 17 g via ORAL
  Filled 2022-02-17 (×8): qty 1

## 2022-02-17 NOTE — Progress Notes (Signed)
Pt switched to nasal cannula - 6L ?

## 2022-02-17 NOTE — Progress Notes (Signed)
Pt's O2 sats started falling on nasal cannula. Pt placed back on HFNC. ?

## 2022-02-17 NOTE — Progress Notes (Addendum)
Physical Therapy Treatment ?Patient Details ?Name: Elizabeth Melton ?MRN: [id number] ?DOB: 07/26/1941 ?Today's Date: 02/17/2022 ? ? ?History of Present Illness 81 y.o. female admitted 3/2 with SOB. Pt with AFib, COPD exacerbation and Covid (+). PMhx: interstitial pulmonary fibrosis, pulm HTN, chronic respiratory failure on 4 -5 L home O2, dCHF, DM, CKD, PAD, HTN, hypothyroidism, OSA on CPAP, paroxysmal A-fib, PE on Eliquis ? ?  ?PT Comments  ? ? Pt with decreased activity tolerance today with increased supplemental O2 demand. Pt with sats 85% on 6L on arrival and required 10L for sats 83-93% during gait. End of session on 8L HFNC 88%. Pt with decreased ability to walk in room today and requiring assist to rise from surfaces. Pt concerned with return home and with lack of progress and continued high O2 demand pt may warrant further therapy at level such as LTACH prior to home if she and family cannot maintain appropriate level of assist at home.  ?  ?HR 56-63  ?  ?Recommendations for follow up therapy are one component of a multi-disciplinary discharge planning process, led by the attending physician.  Recommendations may be updated based on patient status, additional functional criteria and insurance authorization. ? ?Follow Up Recommendations ? Home health PT ?  ?  ?Assistance Recommended at Discharge Frequent or constant Supervision/Assistance  ?Patient can return home with the following A little help with walking and/or transfers;A little help with bathing/dressing/bathroom;Assistance with cooking/housework;Direct supervision/assist for financial management;Assist for transportation;Help with stairs or ramp for entrance ?  ?Equipment Recommendations ? None recommended by PT  ?  ?Recommendations for Other Services   ? ? ?  ?Precautions / Restrictions Precautions ?Precautions: Fall ?Precaution Comments: monitor O2-  ?  ? ?Mobility ? Bed Mobility ?Overal bed mobility: Modified Independent ?  ?  ?  ?  ?  ?  ?General  bed mobility comments: increased time with reliance on rail and HOB 35 degrees ?  ? ?Transfers ?Overall transfer level: Needs assistance ?  ?Transfers: Sit to/from Stand, Bed to chair/wheelchair/BSC ?Sit to Stand: Min assist ?Stand pivot transfers: Min guard ?  ?  ?  ?  ?General transfer comment: min assist to stand from all surfaces today (bed, chair, BSC). guarding with RW for pivot from bed>BSC and end of session from recliner>BSC ?  ? ?Ambulation/Gait ?Ambulation/Gait assistance: Min guard ?Gait Distance (Feet): 30 Feet ?Assistive device: Rolling walker (2 wheels) ?Gait Pattern/deviations: Step-through pattern, Decreased stride length, Trunk flexed ?  ?Gait velocity interpretation: <1.8 ft/sec, indicate of risk for recurrent falls ?  ?General Gait Details: cue for posture and breathing technique. sats 83-93% on 10L with gait limited by fatigue and SOB this session and unable to perform additional trials ? ? ?Stairs ?  ?  ?  ?  ?  ? ? ?Wheelchair Mobility ?  ? ?Modified Rankin (Stroke Patients Only) ?  ? ? ?  ?Balance Overall balance assessment: Needs assistance ?  ?Sitting balance-Leahy Scale: Fair ?Sitting balance - Comments: EOb without support ?  ?Standing balance support: During functional activity, Bilateral upper extremity supported ?Standing balance-Leahy Scale: Poor ?Standing balance comment: RW for gait ?  ?  ?  ?  ?  ?  ?  ?  ?  ?  ?  ?  ? ?  ?Cognition Arousal/Alertness: Awake/alert ?Behavior During Therapy: [facility] for tasks assessed/performed ?Overall Cognitive Status: Within Functional Limits for tasks assessed ?  ?  ?  ?  ?  ?  ?  ?  ?  ?  ?  ?  ?  ?  ?  ?  ?  ?  ?  ? ?  ?  Exercises   ? ?  ?General Comments   ?  ?  ? ?Pertinent Vitals/Pain Pain Assessment ?Pain Assessment: No/denies pain  ? ? ?Home Living   ?  ?  ?  ?  ?  ?  ?  ?  ?  ?   ?  ?Prior Function    ?  ?  ?   ? ?PT Goals (current goals can now be found in the care plan section) Progress towards PT goals: Progressing toward goals ? ?   ?Frequency ? ? ? Min 3X/week ? ? ? ?  ?PT Plan Current plan remains appropriate  ? ? ?Co-evaluation   ?  ?  ?  ?  ? ?  ?AM-PAC PT "6 Clicks" Mobility   ?Outcome Measure ? Help needed turning from your back to your side while in a flat bed without using bedrails?: A Little ?Help needed moving from lying on your back to sitting on the side of a flat bed without using bedrails?: A Little ?Help needed moving to and from a bed to a chair (including a wheelchair)?: A Little ?Help needed standing up from a chair using your arms (e.g., wheelchair or bedside chair)?: A Little ?Help needed to walk in hospital room?: A Little ?Help needed climbing 3-5 steps with a railing? : Total ?6 Click Score: 16 ? ?  ?End of Session Equipment Utilized During Treatment: Oxygen ?Activity Tolerance: Patient limited by fatigue ?Patient left: with call bell/phone within reach;Other (comment) (on [facility] with call bell and RN aware) ?Nurse Communication: Mobility status ?PT Visit Diagnosis: Muscle weakness (generalized) (M62.81);Difficulty in walking, not elsewhere classified (R26.2);Other abnormalities of gait and mobility (R26.89) ?  ? ? ?Time: 4862-8241 ?PT Time Calculation (min) (ACUTE ONLY): 29 min ? ?Charges:  $Gait Training: 8-22 mins ?$Therapeutic Activity: 8-22 mins          ?          ? ?Demonte Dobratz P, PT ?Acute Rehabilitation Services ?Pager: [phone] ?Office: [phone] ? ? ? ?Huxton Glaus B Haim Hansson ?02/17/2022, 1:58 PM ? ?

## 2022-02-17 NOTE — Progress Notes (Signed)
?Progress Note ? ? ?Patient: Elizabeth Melton QBV:[id number] DOB: 11-Mar-1941 DOA: 02/09/2022     7 ?DOS: the patient was seen and examined on 02/17/2022 ?  ?Brief hospital course: ?Mrs. Elizabeth Melton was admitted to the hospital with the working diagnosis of acute on chronic hypoxemic respiratory failure due to decompensated heart failure in the setting of pulmonary fibrosis.  ? ?81 yo female with the past medical history of IPF, COPD, heart failure, pulmonary hypertension, atrial fibrillation, T2DM, pulmonary embolism and chronic hypoxemic respiratory failure who presented with dyspnea. At home patient had worsening dyspnea, worsening oxygenation despite supplemental 02. Home nurse called EMS, his heart rate was greater than 200 (atrial fibrillation) and patient was transported to the hospital. At the time of his arrival his heart rate improved to 90, blood pressure 112/51, rr 10 and 02 saturation 96%, lungs with diffuse rhonchi but no wheezing, heart with S1 and S2 present and rhythmic, no gallops, abdomen soft and no lower extremity edema. ? ?Na 140, K 4,3 Cl 111, bicarb 24 glucose 126, bun 55 and cr 2,8 ?Ca I 1,43  ?Wbc 10,2 hgb 13,9 hct 43, 5 plt 162  ?SARS covid 19 positive  ? ?Chest radiograph with bilateral increased lung markings, enlarged right ventricle/ right atrium.  ? ?CT chest with emphysema and interseptal thickening, with honey combing, consistent with pulmonary fibrosis.  ?9 mm right upper outer breat mass.  ?Similar multiple pulmonary nodules.  ? ?EKG 91 bpm, normal axis, normal intervals, sinus rhythm with right atrial enlargement, no significant ST segment or T wave changes.  ? ?Started on steroids and malnupiravir for COVID 19 infection ? ?03/03 patient had recurrent atrial fibrillation with RVR and was placed on amiodarone infusion. ? ? ? ? ?Assessment and Plan: ?* PAF (paroxysmal atrial fibrillation) ([place]) ?Continue rate control with good toleration.  ? ?Continue with amiodarone  ?Close monitoring of  pulmonary function.  ?Continue telemetry monitoring. ?Anticoagulation with apixaban.  ?Continue holding metoprolol  ? ?Pneumonia due to COVID-19 virus ?Patient with viral pneumonia and worsening respiratory failure with hypoxemia. ?Acute on chronic hypoxemic respiratory failure.  ?Completed antiviral therapy  ? ?RR: 19 ?Pulse oxymetry: 92 to 95  ?Fi02: on 7 L/min per HFNC  ? ?COVID-19 Labs ? ?Recent Labs  ?  02/15/22 ?0425 02/16/22 ?0450 02/17/22 ?2800  ?DDIMER  --  0.60* 0.56*  ?FERRITIN [facility]*  ?CRP 2.5* 1.6* 0.9  ? ? ?Lab Results  ?Component Value Date  ? SARSCOV2NAA POSITIVE (A) 02/09/2022  ? [place] NEGATIVE 01/25/2022  ? [place] NEGATIVE 01/12/2022  ? [place] NEGATIVE 12/28/2021  ? ? ?Improving inflammatory markers. ?Her dyspnea and wheezing are improving,   ? ?Continue with systemic steroids with long taper. ?Continue with bronchodilator therapy. ?Out of bed to chair tid with meals. ?At home on 6 L/min per [place] supplemental 02. ?Out of bed to chair tid with meals,   ? ? ?ILD (interstitial lung disease) ([place]) ?Acute on chronic hypoxemic respiratory failure. ?Oxygenation is 91% on 7 L /min per HFNC. ? ?Systemic and inhaled corticosteroids.  ? ?On bronchodilator therapy and airway clearing techniques.  ? ?Continue with Tyvaso (family asked to bring that in) ? ?Patient did not tolerate regular nasal cannula today, will continue with high flow nasal cannula and continue to wean as tolerated keep 02 saturation 88% or greater.  ? ? ?Acute kidney injury superimposed on chronic kidney disease ([place]) ?Hyperkalemia, hyponatremia  ?Diuresis now on hold with improvement in renal function with serum cr at 1,37, with  K at 4,8 and serum bicarbonate at 25. Na 134.  ? ?Plan to continue to hold on diuresis and follow up renal function in am.  ? ?Essential (primary) hypertension ?Blood pressure has been stable. ?Metoprolol has been held due to bradycardia.  ?Continue blood pressure monitoring  ? ? ?Type 2  diabetes mellitus with hyperlipidemia ([place]) ?Uncontrolled hyperglycemia.  ? ?Fasting glucose this am 142  ?Plan to continue glucose cover and monitoring with insulin sliding scale. ?Continue pre- meal and basal insulin.  ?Continue statin therapy.  ?Patient is tolerating po well  ? ?Mood disorder ([place]) ?On Wellbutrin, Cymbalta ? ?Hypothyroidism ?On levothyroxine  ? ?Hyperlipidemia/PAD ?-Continue Mevacor (pravastatin per formulary) ? ?Tobacco abuse ?Continue with nicotine patch. ? ? ?Breast mass ?Incidentally noted on CT chest, will need mammogram as outpatient when stable ? ? ? ? ?  ? ?Subjective: Patient with improvement in dyspnea, did not tolerate regular nasal cannula  ? ?Physical Exam: ?Vitals:  ? 02/17/22 1943 02/17/22 1944 02/17/22 1945 02/17/22 1950  ?BP:    (!) 156/66  ?Pulse: 65 62 60 62  ?Resp: (!) 30 (!) 31 (!) 28 19  ?Temp:    97.9 ?F (36.6 ?C)  ?TempSrc:    Oral  ?SpO2: (!) 67% (!) 62% (!) 67%   ?Weight:      ?Height:      ? ?Neurology awake and alert ?ENT with no pallor ?Cardiovascular with S1 and S2 present, with no gallops or murmurs ?No JVD ?No lower extremity edema ?Respiratory with scattered rales but no wheezing or rhonchi ?Abdomen soft and not distended  ?Data Reviewed: ? ? ? ?Family Communication: no family at the bedside  ? ?Disposition: ?Status is: Inpatient ?Remains inpatient appropriate because: respiratory failure  ? Planned Discharge Destination: Home ? ?Author: ?Tawni Millers, MD ?02/17/2022 8:39 PM ? ?For on call review [email or web address].  ?

## 2022-02-17 NOTE — Progress Notes (Signed)
? ?NAME:  Elizabeth Melton, MRN:  [id number], DOB:  1941/09/29, LOS: 7 ?ADMISSION DATE:  02/09/2022, CONSULTATION DATE:  02/12/22 ?REFERRING MD:  Broadus John - [facility], CHIEF COMPLAINT:  Acute on chronic hypoxia   ? ?History of Present Illness:  ?81 yo F PMH chronic hypoxic respiratory failure (uses 4-6L at baseline), IPF, chronic diastolic HF, pulmonary hypertension, Afib, submassive PE, HTN, prior tobacco use who presented to ED 3/2 with increased HR and hypoxia after being seen by home health nurse. In ED, had converted out of Afib RVR to NSR, after a 5 second pause. She was hypoxic in ED requiring NRB. Admitted to [facility].  ?Hospital course marked by recurrent Afib RVR for which cardiology has been consulted, as well as increasing O2 requirement. She has been diuresed per cardiology guidance, and a CT chest which did not show significant progression of fibrosis compared to most recent imaging -- O2 requirement remains elevated, requiring 10L HFNC 3/5 ? ?PCCM is consulted in this setting ? ?Of note this is the patient's 3rd admission in 2023.  Palliative medicine saw her on 2/17 and the patient's family requested full scope of practice.  ? ?She tells me that she has had worsening cough and mucus production over the last few days.  Typically she will cough up a small amount of sputum in the mornings, but lately it has been more voluminous, darker, and bloodier than normal.  Also with increased dyspnea and wheezing.  Minimal leg swelling.  ? ?She is fully unvaccinated against COVID-19.  ? ?Pertinent  Medical History  ?IPF ?Chronic diastolic HF ?COPD ?Pulmonary hypertension  ?Submassive PE  ?DM2 ?Afib ?Hypothyroidism  ?CKD IIIA ?OSA ? ?Significant Hospital Events: ?Including procedures, antibiotic start and stop dates in addition to other pertinent events   ?3/2 to ED with incr HR and hypoxia after [facility] visit. Admit to [facility]. Cards consulted. Metop dc ?3/3 back in Afib -- started on IV amio. Restarted metop. Palli consult, full code   ?3/4 worse resp effort. Diuresed. Back in sinus ?3/5 pulm consult. Started demadex. Steroids  ? ?Interim History / Subjective:  ? ?Remains eager to go home. On 7L  ?Cr downt o 1.37 from 1.79 ? ? ?Objective   ?Blood pressure 128/72, pulse (!) 48, temperature (!) 97.5 ?F (36.4 ?C), temperature source Oral, resp. rate 20, height _0  (1.676 m), weight 73 kg, SpO2 99 %. ?   ?   ? ?Intake/Output Summary (Last 24 hours) at 02/17/2022 0959 ?Last data filed at 02/17/2022 9767 ?Gross per 24 hour  ?Intake 490 ml  ?Output 500 ml  ?Net -10 ml  ? ?Filed Weights  ? 02/15/22 0530 02/16/22 0500 02/17/22 0629  ?Weight: 73.2 kg 73.5 kg 73 kg  ? ? ?Examination: ?General:  chronically ill appearing elderly F seated NAD  ?HENT: NCAT pink mm ?PULM: Even unlabored on 7L. Fine crackles  ?CV: rrr + systolic murmur  ?GI: ndnt  ?MSK: no acute joint deformity  ?Neuro: AAOx3 following commands ?Psych: calm, cooperative  ? ?Resolved Hospital Problem list   ? ? ?Assessment & Plan:  ? ? ?Acute on chronic hypoxemic respiratory failure ?AECOPD ?COVID-19 infection with PNA ?IPF ?pHTN ?Diastolic HR ?OSA ?Discussion: ?-Likely AECOPD 2/2 covid infection, which likely catalyzed afib. Less likely ILD flare. Timing not c/w amio tox  ?-Feels like she is improving  ?-s/p course of molnupiravir  ?Plan: ?-cont pred 32m (stop date ordered)  ?-brovana, budesonide, yupelri ?-mucinex ?-IS / flutter / mobility ?-wean O2 as able for  goal >92 ?-amio, anticoag, diuresis per cards  ?- On discharge continue Breztri 2 puffs twice daily for inhaled therapies. Stop Stiolto per pulmonary clinic note.  ? ? ? ?Will follow peripherally over the weekend but available if any questions/concerns and can facilitate pulm follow up if she is discharged soon ? ?Eliseo Gum MSN, AGACNP-BC ?[facility] ?Amion for pager  ?02/17/2022, 10:25 AM ? ? ? ? ? ?

## 2022-02-18 DIAGNOSIS — I4891 Unspecified atrial fibrillation: Secondary | ICD-10-CM | POA: Diagnosis not present

## 2022-02-18 DIAGNOSIS — J849 Interstitial pulmonary disease, unspecified: Secondary | ICD-10-CM | POA: Diagnosis not present

## 2022-02-18 DIAGNOSIS — N179 Acute kidney failure, unspecified: Secondary | ICD-10-CM | POA: Diagnosis not present

## 2022-02-18 DIAGNOSIS — I10 Essential (primary) hypertension: Secondary | ICD-10-CM | POA: Diagnosis not present

## 2022-02-18 DIAGNOSIS — U071 COVID-19: Secondary | ICD-10-CM | POA: Diagnosis not present

## 2022-02-18 DIAGNOSIS — I455 Other specified heart block: Secondary | ICD-10-CM | POA: Diagnosis not present

## 2022-02-18 DIAGNOSIS — I480 Paroxysmal atrial fibrillation: Secondary | ICD-10-CM | POA: Diagnosis not present

## 2022-02-18 LAB — BASIC METABOLIC PANEL
Anion gap: 7 (ref 5–15)
BUN: 44 mg/dL — ABNORMAL HIGH (ref 8–23)
CO2: 24 mmol/L (ref 22–32)
Calcium: 8.9 mg/dL (ref 8.9–10.3)
Chloride: 104 mmol/L (ref 98–111)
Creatinine, Ser: 1.23 mg/dL — ABNORMAL HIGH (ref 0.44–1.00)
GFR, Estimated: 45 mL/min — ABNORMAL LOW (ref >60)
Glucose, Bld: 129 mg/dL — ABNORMAL HIGH (ref 70–99)
Potassium: 4.5 mmol/L (ref 3.5–5.1)
Sodium: 135 mmol/L (ref 135–145)

## 2022-02-18 LAB — GLUCOSE, CAPILLARY
Glucose-Capillary: 243 mg/dL — ABNORMAL HIGH (ref 70–99)
Glucose-Capillary: 230 mg/dL — ABNORMAL HIGH (ref 70–99)
Glucose-Capillary: 170 mg/dL — ABNORMAL HIGH (ref 70–99)
Glucose-Capillary: 111 mg/dL — ABNORMAL HIGH (ref 70–99)

## 2022-02-18 LAB — D-DIMER, QUANTITATIVE: D-Dimer, Quant: 0.94 ug/mL-FEU — ABNORMAL HIGH (ref 0.00–0.50)

## 2022-02-18 LAB — C-REACTIVE PROTEIN: CRP: 0.7 mg/dL (ref <1.0)

## 2022-02-18 LAB — FERRITIN: Ferritin: 444 ng/mL — ABNORMAL HIGH (ref 11–307)

## 2022-02-18 MED ORDER — FUROSEMIDE 20 MG PO TABS
20.0000 mg | ORAL_TABLET | Freq: Every day | ORAL | Status: DC
  Administered 2022-02-19 – 2022-02-21 (×3): 20 mg via ORAL
  Filled 2022-02-18 (×3): qty 1

## 2022-02-18 MED ORDER — AMIODARONE HCL 200 MG PO TABS
200.0000 mg | ORAL_TABLET | Freq: Two times a day (BID) | ORAL | Status: DC
  Administered 2022-02-19 – 2022-02-21 (×5): 200 mg via ORAL
  Filled 2022-02-18 (×5): qty 1

## 2022-02-18 NOTE — Progress Notes (Signed)
? ?Progress Note ? ?Patient Name: Elizabeth Melton ?Date of Encounter: 02/18/2022 ? ?[facility] Cardiologist: [name], MD  ? ?Subjective  ? ?Feeling better. Cough is less frequent but still productive. Breathing stable. ? ?Weaned to 6L [place] ?Cr continues to improve 1.37>1.23 ? ?Inpatient Medications  ?  ?Scheduled Meds: ? [START ON 02/19/2022] amiodarone  200 mg Oral Daily  ? amiodarone  400 mg Oral BID  ? apixaban  5 mg Oral BID  ? arformoterol  15 mcg Nebulization BID  ? vitamin C  500 mg Oral Daily  ? budesonide (PULMICORT) nebulizer solution  0.5 mg Nebulization BID  ? buPROPion  150 mg Oral Daily  ? DULoxetine  30 mg Oral Daily  ? guaiFENesin  1,200 mg Oral BID  ? insulin aspart  0-15 Units Subcutaneous TID WC  ? insulin aspart  0-5 Units Subcutaneous QHS  ? insulin aspart  3 Units Subcutaneous TID WC  ? insulin glargine-yfgn  10 Units Subcutaneous Daily  ? levothyroxine  75 mcg Oral QAC breakfast  ? methylPREDNISolone (SOLU-MEDROL) injection  40 mg Intravenous Q24H  ? nicotine  14 mg Transdermal Daily  ? pantoprazole  40 mg Oral Daily  ? polyethylene glycol  17 g Oral BID  ? pravastatin  40 mg Oral q1800  ? revefenacin  175 mcg Nebulization Daily  ? sodium chloride flush  3 mL Intravenous Q12H  ? zinc sulfate  220 mg Oral Daily  ? ?Continuous Infusions: ? ? ?PRN Meds: ?acetaminophen **OR** acetaminophen, albuterol, hydrALAZINE, methocarbamol, ondansetron **OR** ondansetron (ZOFRAN) IV, traMADol  ? ?Vital Signs  ?  ?Vitals:  ? 02/17/22 2200 02/18/22 0003 02/18/22 0606 02/18/22 0750  ?BP: (!) 137/111 (!) 143/107 136/62 (!) 126/96  ?Pulse:  (!) 57 (!) 52 (!) 55  ?Resp:  20 19 (!) 21  ?Temp:  97.8 ?F (36.6 ?C) 97.8 ?F (36.6 ?C) 97.9 ?F (36.6 ?C)  ?TempSrc:  Oral Oral Oral  ?SpO2: 91% (!) 86% 94% 96%  ?Weight:   72.7 kg   ?Height:      ? ? ?Intake/Output Summary (Last 24 hours) at 02/18/2022 0820 ?Last data filed at 02/18/2022 5749 ?Gross per 24 hour  ?Intake 485 ml  ?Output 700 ml  ?Net -215 ml   ? ? ?Last 3 Weights 02/18/2022 02/17/2022 02/16/2022  ?Weight (lbs) 160 lb 3.2 oz 160 lb 15 oz 162 lb 0.6 oz  ?Weight (kg) 72.666 kg 73 kg 73.5 kg  ?   ?EXAM ?GEN: Elderly female, sitting up in bed ?Neck: No JVD ?Cardiac: RRR, 2/6 systolic murmur  ?Respiratory: Diminished throughout ?GI: Soft, nontender, non-distended  ?MS: No edema; No deformity. ?Neuro:  Nonfocal  ?Psych: Normal affect   ? ?Telemetry  ?  ?NSR/Sinus brady- Personally Reviewed ? ?ECG  ?  ?No new tracing- Personally Reviewed ? ? ?Labs  ?  ?High Sensitivity Troponin:   ?Recent Labs  ?Lab 01/25/22 ?2356 01/26/22 ?0320  ?TROPONINIHS 29* 31*  ? ?   ?Chemistry ?Recent Labs  ?Lab 02/16/22 ?0450 02/17/22 ?3552 02/18/22 ?0245  ?NA 136 134* 135  ?K 5.0 4.8 4.5  ?CL 104 101 104  ?CO2 _0 ?GLUCOSE 252* 141* 129*  ?BUN 67* 52* 44*  ?CREATININE 1.79* 1.37* 1.23*  ?CALCIUM 9.0 9.2 8.9  ?MG  --  2.5*  --   ?GFRNONAA 28* 39* 45*  ?ANIONGAP _1 ? ?  ?Lipids No results for input(s): CHOL, TRIG, HDL, LABVLDL, LDLCALC, CHOLHDL in the last 168  hours.  ?Hematology ?Recent Labs  ?Lab 02/12/22 ?0221 02/13/22 ?0359 02/15/22 ?0425  ?WBC 8.0 8.0 10.4  ?RBC 4.30 4.41 4.42  ?HGB 14.4 14.8 14.6  ?HCT 42.8 43.6 45.0  ?MCV 99.5 98.9 101.8*  ?MCH 33.5 33.6 33.0  ?MCHC 33.6 33.9 32.4  ?RDW 17.3* 16.8* 16.8*  ?PLT 156 134* 196  ? ? ?Thyroid No results for input(s): TSH, FREET4 in the last 168 hours.  ?BNP ?Recent Labs  ?Lab 02/16/22 ?1046  ?BNP 1,083.1*  ? ?  ?DDimer  ?Recent Labs  ?Lab 02/16/22 ?0450 02/17/22 ?4315 02/18/22 ?0245  ?DDIMER 0.60* 0.56* 0.94*  ? ?  ? ?Radiology  ?  ?No results found. ? ?Cardiac Studies  ? ?TTE 11/04/21: ?IMPRESSIONS  ? 1. Left ventricular ejection fraction, by estimation, is 60 to 65%. The  ?left ventricle has normal function. The left ventricle has no regional  ?wall motion abnormalities. Left ventricular diastolic parameters were  ?normal.  ? 2. Right ventricular systolic function is mildly reduced. The right  ?ventricular size is mildly  enlarged. There is severely elevated pulmonary  ?artery systolic pressure.  ? 3. Left atrial size was mildly dilated.  ? 4. Right atrial size was moderately dilated.  ? 5. The pericardial effusion is posterior to the left ventricle.  ? 6. The mitral valve is abnormal. Mild mitral valve regurgitation. No  ?evidence of mitral stenosis. Moderate mitral annular calcification.  ? 7. Significant pulmonary HTN based on TR velocity . Tricuspid valve  ?regurgitation is moderate.  ? 8. CW across AV not done Manual trace from AR tracing shows stable mean  ?gradient of 9 mmHg . The aortic valve is normal in structure. Aortic valve  ?regurgitation is mild. Mild aortic valve stenosis.  ? 9. The inferior vena cava is dilated in size with <50% respiratory  ?variability, suggesting right atrial pressure of 15 mmHg. ?  ?[place] 01/06/22: ?Hemodynamics (mmHg) ?RA 5 ?RV 72/7 ?PA 68/27, mean 42 ?PCWP mean 9 ?Oxygen saturations: ?PA 62% ?AO 96% ?Cardiac Output (Fick) 3.38  ?Cardiac Index (Fick) 1.83 ?PVR 9.7 WU ?  ?CARDIAC CATH: 11/23/2021 ?1. Normal filling pressures.  ?2. Severe pulmonary arterial hypertension.  ?3. Low cardiac output (though oxygen saturation was also low) ?4. Nonobstructive mild CAD.  ? ?Patient Profile  ?   ?81 y.o. 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 Also notably COVID positive on admission. ? ?Assessment & Plan  ?  ?#Paroxysmal atrial fibrillation: ?Very limited medication options. Discussions held previously with EP and primary Cardiologist with the decision made to load with amiodarone despite known lung disease in order to try to maintain sinus rhythm.  ?-S/p amiodarone 412m BID x2 days; will continue amio 200 mg twice a day for 7 days, and then 200 mg a day  thereafter ?- At some point consider 100 mg of amiodarone, lowest dose possible to help maintain her normal rhythm. ?- Continue metop 24mXL daily ?- Continue apixaban 83m27mD ? 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 RHC with severe pulmonary HTN with mPAP 10m60m PCWP 9. On chronic 4-6 L O2 at home that has been steadily increasing to 9-12L. Has been seen  by palliative and remains full code with full scope of medical care. ?-Management per Pulm and primary ?-Palliative consulted on multiple admissions and patient remains full code with full scope of care ? ?#Chronic diastolic heart failure ?EF 60-65%. Mainly with cor pulmonale with severe pulmonary HTN. Was diuresed with lasix IV now holding given rising Cr. ?-Cr improved with holding torsemide ?-Will likely need maintenance diuretic once Cr stabilizes; plan for lasix 29m daily to start tomorrow ?-Will need close follow-up as outpatient as may need more diuretic at home ?-Low Na diet ?-Monitor I/Os and daily weights ? ?#COVID ?- On molnupiravir per primary team ? ?#AKI on CKD IIIa: ?Improved with holding diuretics. ?-Continue to hold torsemide for now; likely plan for lasix 239mdaily given rise in Cr to torsemide 2030maily ? ? ?For questions or updates, please contact [facility] consult [email or web address] for contact info under  ? ?  ?   ?Signed, ?HeaFreada BergeronD  ?02/18/2022, 8:20 AM   ? ?

## 2022-02-18 NOTE — Progress Notes (Signed)
?Progress Note ? ? ?Patient: Elizabeth Melton [id number] DOB: 1940-12-12 DOA: 02/09/2022     8 ?DOS: the patient was seen and examined on 02/18/2022 ?  ?Brief hospital course: ?Mrs. Chevez was admitted to the hospital with the working diagnosis of acute on chronic hypoxemic respiratory failure due to decompensated heart failure in the setting of pulmonary fibrosis.  ? ?81 yo female with the past medical history of IPF, COPD, heart failure, pulmonary hypertension, atrial fibrillation, T2DM, pulmonary embolism and chronic hypoxemic respiratory failure who presented with dyspnea. At home patient had worsening dyspnea, worsening oxygenation despite supplemental 02. Home nurse called EMS, his heart rate was greater than 200 (atrial fibrillation) and patient was transported to the hospital. At the time of his arrival his heart rate improved to 90, blood pressure 112/51, rr 10 and 02 saturation 96%, lungs with diffuse rhonchi but no wheezing, heart with S1 and S2 present and rhythmic, no gallops, abdomen soft and no lower extremity edema. ? ?Na 140, K 4,3 Cl 111, bicarb 24 glucose 126, bun 55 and cr 2,8 ?Ca I 1,43  ?Wbc 10,2 hgb 13,9 hct 43, 5 plt 162  ?SARS covid 19 positive  ? ?Chest radiograph with bilateral increased lung markings, enlarged right ventricle/ right atrium.  ? ?CT chest with emphysema and interseptal thickening, with honey combing, consistent with pulmonary fibrosis.  ?9 mm right upper outer breat mass.  ?Similar multiple pulmonary nodules.  ? ?EKG 91 bpm, normal axis, normal intervals, sinus rhythm with right atrial enlargement, no significant ST segment or T wave changes.  ? ?Started on steroids and malnupiravir for COVID 19 infection ? ?03/03 patient had recurrent atrial fibrillation with RVR and was placed on amiodarone infusion. ? ? ? ? ?Assessment and Plan: ?* PAF (paroxysmal atrial fibrillation) ([place]) ?Rate controlled with HR in the 60 ? ?Tolerating well amiodarone  ?Will need a close monitoring  of pulmonary function.  ? ?Anticoagulation with apixaban.  ?Holding metoprolol due to bradycardia.  ?Continue telemetry monitoring.  ? ?Pneumonia due to COVID-19 virus ?Patient with viral pneumonia and worsening respiratory failure with hypoxemia. ?Acute on chronic hypoxemic respiratory failure.  ?Completed antiviral therapy  ? ?RR: 20 ?Pulse oxymetry: 96 ?Fi02: on 5 L/min per HFNC  ? ?COVID-19 Labs ? ?Recent Labs  ?  02/16/22 ?0450 02/17/22 ?7893 02/18/22 ?0245  ?DDIMER 0.60* 0.56* 0.94*  ?FERRITIN 513* 454* 444*  ?CRP 1.6* 0.9 0.7  ? ? ?Lab Results  ?Component Value Date  ? SARSCOV2NAA POSITIVE (A) 02/09/2022  ? [place] NEGATIVE 01/25/2022  ? [place] NEGATIVE 01/12/2022  ? [place] NEGATIVE 12/28/2021  ? ? ?Continue with improving inflammatory markers. ? ?Continue with IV systemic steroids 40 mg methylprednisolone.  ?Inhaled corticosteroids.  ?Continue with bronchodilator therapy. ?Out of bed to chair tid with meals. ? ? ? ?ILD (interstitial lung disease) ([place]) ?Acute on chronic hypoxemic respiratory failure. ?Oxygenation is 96% on 5 L /min per HFNC. ? ?Continue with systemic IV and inhaled corticosteroids.  ? ?Continue with bronchodilator therapy and airway clearing techniques.  ? ?Continue with Tyvaso (family asked to bring that in) ? ? ? ?Acute kidney injury superimposed on chronic kidney disease ([place]) ?Hyperkalemia, hyponatremia  ?Renal function continue to improve with serum cr at 1.23 with K at 4,5 and serum bicarbonate at 24. ?Continue close follow up renal function and electrolytes.  ? ?Holding diuretic therapy.  ? ?Essential (primary) hypertension ?Blood pressure continue to be stable with systolic at 810 mmHg.  ?Continue rate control atrial fibrillation with amiodarone.  ? ? ?  Type 2 diabetes mellitus with hyperlipidemia ([place]) ?Uncontrolled hyperglycemia.  ? ?Fasting glucose this am 129  ?Plan to continue glucose cover and monitoring with insulin sliding scale. ?Continue pre- meal and basal  insulin.  ?Continue statin therapy.  ?Patient is tolerating po well  ? ?Mood disorder ([place]) ?On Wellbutrin, Cymbalta ? ?Hypothyroidism ?On levothyroxine  ? ?Hyperlipidemia/PAD ?-Continue Mevacor (pravastatin per formulary) ? ?Tobacco abuse ?Continue with nicotine patch. ? ? ?Breast mass ?Incidentally noted on CT chest, will need mammogram as outpatient when stable ? ? ? ? ?  ? ?Subjective: patient with improvement in her dyspnea, no chest pain,. Tolerating po well.  ? ?Physical Exam: ?Vitals:  ? 02/18/22 0830 02/18/22 1207 02/18/22 1208 02/18/22 1458  ?BP:   (!) 143/77 (!) 120/49  ?Pulse:  62 (!) 59 (!) 58  ?Resp:   19 20  ?Temp:   97.9 ?F (36.6 ?C) 97.9 ?F (36.6 ?C)  ?TempSrc:   Oral Oral  ?SpO2: 98%  90% 91%  ?Weight:      ?Height:      ? ?Neurology awake and alert ?ENT with no pallor ?Cardiovascular with S1 and S2 present with no gallops or rubs,  ?Mid JVD ?No lower extremity edema ?Respiratory with rales bilaterally, no wheezing or rhonchi ?Abdomen soft ?Data Reviewed: ? ? ? ?Family Communication: no family at the bedside  ? ?Disposition: ?Status is: Inpatient ?Remains inpatient appropriate because: respiratory failure  ? Planned Discharge Destination: Home ? ?Author: ?Tawni Millers, MD ?02/18/2022 5:21 PM ? ?For on call review [email or web address]  ?

## 2022-02-19 DIAGNOSIS — N179 Acute kidney failure, unspecified: Secondary | ICD-10-CM | POA: Diagnosis not present

## 2022-02-19 DIAGNOSIS — I480 Paroxysmal atrial fibrillation: Secondary | ICD-10-CM | POA: Diagnosis not present

## 2022-02-19 DIAGNOSIS — U071 COVID-19: Secondary | ICD-10-CM | POA: Diagnosis not present

## 2022-02-19 DIAGNOSIS — J849 Interstitial pulmonary disease, unspecified: Secondary | ICD-10-CM | POA: Diagnosis not present

## 2022-02-19 LAB — GLUCOSE, CAPILLARY
Glucose-Capillary: 170 mg/dL — ABNORMAL HIGH (ref 70–99)
Glucose-Capillary: 165 mg/dL — ABNORMAL HIGH (ref 70–99)
Glucose-Capillary: 126 mg/dL — ABNORMAL HIGH (ref 70–99)
Glucose-Capillary: 87 mg/dL (ref 70–99)

## 2022-02-19 NOTE — Progress Notes (Signed)
Cardiology will sign off. Will arrange for CV follow-up. ? ?Gwyndolyn Kaufman, MD ?

## 2022-02-19 NOTE — Progress Notes (Signed)
Attempted to decrease pt's oxygen to 6 L HFNC. Pt started desating in the 58s. Increased oxygen back up to 7 L. ?

## 2022-02-19 NOTE — Progress Notes (Addendum)
?Progress Note ? ? ?Patient: Elizabeth Melton [id number]:[id number] DOB: Jun 06, 1941 DOA: 02/09/2022     9 ?DOS: the patient was seen and examined on 02/19/2022 ?  ?Brief hospital course: ?Mrs. Fetty was admitted to the hospital with the working diagnosis of acute on chronic hypoxemic respiratory failure due to decompensated heart failure in the setting of pulmonary fibrosis.  ? ?81 yo female with the past medical history of IPF, COPD, heart failure, pulmonary hypertension, atrial fibrillation, T2DM, pulmonary embolism and chronic hypoxemic respiratory failure who presented with dyspnea. At home patient had worsening dyspnea, worsening oxygenation despite supplemental 02. Home nurse called EMS, his heart rate was greater than 200 (atrial fibrillation) and patient was transported to the hospital. At the time of his arrival his heart rate improved to 90, blood pressure 112/51, rr 10 and 02 saturation 96%, lungs with diffuse rhonchi but no wheezing, heart with S1 and S2 present and rhythmic, no gallops, abdomen soft and no lower extremity edema. ? ?Na 140, K 4,3 Cl 111, bicarb 24 glucose 126, bun 55 and cr 2,8 ?Ca I 1,43  ?Wbc 10,2 hgb 13,9 hct 43, 5 plt 162  ?SARS covid 19 positive  ? ?Chest radiograph with bilateral increased lung markings, enlarged right ventricle/ right atrium.  ? ?CT chest with emphysema and interseptal thickening, with honey combing, consistent with pulmonary fibrosis.  ?9 mm right upper outer breat mass.  ?Similar multiple pulmonary nodules.  ? ?EKG 91 bpm, normal axis, normal intervals, sinus rhythm with right atrial enlargement, no significant ST segment or T wave changes.  ? ?Started on steroids and malnupiravir for COVID 19 infection ? ?03/03 patient had recurrent atrial fibrillation with RVR and was placed on amiodarone infusion. ? ? ? ? ?Assessment and Plan: ?* PAF (paroxysmal atrial fibrillation) ([place]) ?Rate controlled with HR in the 60 ? ?Tolerating well amiodarone  ?Will need a close monitoring  of pulmonary function.  ? ?Continue anticoagulation with apixaban.  ?Holding metoprolol due to bradycardia.  ?Continue telemetry monitoring.  ? ?Pneumonia due to COVID-19 virus ?Patient with viral pneumonia and worsening respiratory failure with hypoxemia. ?Acute on chronic hypoxemic respiratory failure.  ?Completed antiviral therapy  ? ?RR: 20 ?Pulse oxymetry: 91 ?Fi02: on 7 to 8 L/min per HFNC  ? ?COVID-19 Labs ? ?Recent Labs  ?  02/17/22 ?8469 02/18/22 ?0245  ?DDIMER 0.56* 0.94*  ?FERRITIN 454* 444*  ?CRP 0.9 0.7  ? ? ?Lab Results  ?Component Value Date  ? SARSCOV2NAA POSITIVE (A) 02/09/2022  ? [place] NEGATIVE 01/25/2022  ? [place] NEGATIVE 01/12/2022  ? [place] NEGATIVE 12/28/2021  ? ? ? ?Plan to continue with IV systemic steroids 40 mg methylprednisolone.  ?On inhaled corticosteroids.  ?Continue with bronchodilator therapy. ?Out of bed to chair tid with meals. ?Add airway clearing techniques with flutter valve and incentive spirometer.  ?Today is day 10 of positive COVID 19 test will evaluate discontinue respiratory isolation tomorrow.  ? ? ?ILD (interstitial lung disease) ([place]) ?Acute on chronic hypoxemic respiratory failure. ?Oxygenation is 96% on 5 L /min per HFNC. ? ?Continue with systemic IV and inhaled corticosteroids.  ? ?Continue with bronchodilator therapy and airway clearing techniques.  ? ?Continue with Tyvaso (family asked to bring that in) ? ? ? ?Acute kidney injury superimposed on chronic kidney disease ([place]) ?Hyperkalemia, hyponatremia  ? ?Holding diuretic therapy.  ?Follow up renal function in am  ? ?Essential (primary) hypertension ?Blood pressure continue to be stable with systolic at 629 mmHg .  ?Continue rate control atrial fibrillation  with amiodarone.  ? ? ?Type 2 diabetes mellitus with hyperlipidemia ([place]) ?Uncontrolled hyperglycemia.  ? ?Plan to continue glucose cover and monitoring with insulin sliding scale. ?Capillary glucose has been controlled, 111, 170, 164  ?Continue  pre- meal and basal insulin.  ?Continue statin therapy.  ?Patient is tolerating po well  ? ?Mood disorder ([place]) ?On Wellbutrin, Cymbalta ? ?Hypothyroidism ?On levothyroxine  ? ?Hyperlipidemia/PAD ?-Continue Mevacor (pravastatin per formulary) ? ?Tobacco abuse ?Continue with nicotine patch. ? ? ?Breast mass ?Incidentally noted on CT chest, will need mammogram as outpatient when stable ? ? ? ? ?  ? ?Subjective: patient with stable dyspnea, not worsening but not back to baseline, no chest pain, no nausea or vomiting, continue with no bowel movement  ? ?Physical Exam: ?Vitals:  ? 02/19/22 0600 02/19/22 0609 02/19/22 0745 02/19/22 0915  ?BP:  (!) 145/78 (!) 161/60   ?Pulse:  (!) 56 (!) 50   ?Resp: _0 ?Temp:  (!) 97.5 ?F (36.4 ?C) 97.7 ?F (36.5 ?C)   ?TempSrc:  Oral Oral   ?SpO2: 97% 94% 97% 91%  ?Weight:  72.9 kg    ?Height:      ? ?Neurology awake and alert ?ENT with no pallor ?Cardiovascular with S1 and S2 present with no gallops or murmurs ?No JVD ?No lower extremity edema ?Respiratory with rales bilaterally but not wheezing ?Abdomen soft  ?Data Reviewed: ? ? ? ?Family Communication: no family at the bedside  ? ?Disposition: ?Status is: Inpatient ?Remains inpatient appropriate because: respiratory failure  ? Planned Discharge Destination: Home ? ? ?Author: ?Tawni Millers, MD ?02/19/2022 3:14 PM ? ?For on call review [email or web address]  ?

## 2022-02-19 NOTE — Progress Notes (Signed)
Pt provided with flutter valve and incentive spirometer and instructed on how to use it.  ?

## 2022-02-20 DIAGNOSIS — J849 Interstitial pulmonary disease, unspecified: Secondary | ICD-10-CM | POA: Diagnosis not present

## 2022-02-20 DIAGNOSIS — I480 Paroxysmal atrial fibrillation: Secondary | ICD-10-CM | POA: Diagnosis not present

## 2022-02-20 DIAGNOSIS — N179 Acute kidney failure, unspecified: Secondary | ICD-10-CM | POA: Diagnosis not present

## 2022-02-20 DIAGNOSIS — U071 COVID-19: Secondary | ICD-10-CM | POA: Diagnosis not present

## 2022-02-20 LAB — BASIC METABOLIC PANEL
Anion gap: 8 (ref 5–15)
BUN: 28 mg/dL — ABNORMAL HIGH (ref 8–23)
CO2: 22 mmol/L (ref 22–32)
Calcium: 9 mg/dL (ref 8.9–10.3)
Chloride: 103 mmol/L (ref 98–111)
Creatinine, Ser: 1.14 mg/dL — ABNORMAL HIGH (ref 0.44–1.00)
GFR, Estimated: 49 mL/min — ABNORMAL LOW (ref >60)
Glucose, Bld: 145 mg/dL — ABNORMAL HIGH (ref 70–99)
Potassium: 5.3 mmol/L — ABNORMAL HIGH (ref 3.5–5.1)
Sodium: 133 mmol/L — ABNORMAL LOW (ref 135–145)

## 2022-02-20 LAB — GLUCOSE, CAPILLARY
Glucose-Capillary: 178 mg/dL — ABNORMAL HIGH (ref 70–99)
Glucose-Capillary: 269 mg/dL — ABNORMAL HIGH (ref 70–99)
Glucose-Capillary: 93 mg/dL (ref 70–99)
Glucose-Capillary: 168 mg/dL — ABNORMAL HIGH (ref 70–99)

## 2022-02-20 MED ORDER — GUAIFENESIN-DM 100-10 MG/5ML PO SYRP
5.0000 mL | ORAL_SOLUTION | Freq: Three times a day (TID) | ORAL | Status: DC
Start: 2022-02-20 — End: 2022-02-21
  Administered 2022-02-20 – 2022-02-21 (×3): 5 mL via ORAL
  Filled 2022-02-20 (×3): qty 5

## 2022-02-20 NOTE — Progress Notes (Signed)
?Progress Note ? ? ?Patient: Elizabeth Melton YKZ:[id number] DOB: 15-Apr-1941 DOA: 02/09/2022     10 ?DOS: the patient was seen and examined on 02/20/2022 ?  ?Brief hospital course: ?Mrs. Mangino was admitted to the hospital with the working diagnosis of acute on chronic hypoxemic respiratory failure due to decompensated heart failure in the setting of pulmonary fibrosis.  ? ?81 yo female with the past medical history of IPF, COPD, heart failure, pulmonary hypertension, atrial fibrillation, T2DM, pulmonary embolism and chronic hypoxemic respiratory failure who presented with dyspnea. At home patient had worsening dyspnea, worsening oxygenation despite supplemental 02. Home nurse called EMS, his heart rate was greater than 200 (atrial fibrillation) and patient was transported to the hospital. At the time of his arrival his heart rate improved to 90, blood pressure 112/51, rr 10 and 02 saturation 96%, lungs with diffuse rhonchi but no wheezing, heart with S1 and S2 present and rhythmic, no gallops, abdomen soft and no lower extremity edema. ? ?Na 140, K 4,3 Cl 111, bicarb 24 glucose 126, bun 55 and cr 2,8 ?Ca I 1,43  ?Wbc 10,2 hgb 13,9 hct 43, 5 plt 162  ?SARS covid 19 positive  ? ?Chest radiograph with bilateral increased lung markings, enlarged right ventricle/ right atrium.  ? ?CT chest with emphysema and interseptal thickening, with honey combing, consistent with pulmonary fibrosis.  ?9 mm right upper outer breat mass.  ?Similar multiple pulmonary nodules.  ? ?EKG 91 bpm, normal axis, normal intervals, sinus rhythm with right atrial enlargement, no significant ST segment or T wave changes.  ? ?Started on steroids and malnupiravir for COVID 19 infection ? ?03/03 patient had recurrent atrial fibrillation with RVR and was placed on amiodarone infusion. ? ?03/13 heart rate is controlled, sinus rhythm. ?Slowly improving oxygenation but continue with high oxygen requirements. ?Discontinue respiratory isolation, more than  10 days since positive test and no signs of active viral infection  ? ? ? ? ?Assessment and Plan: ?* PAF (paroxysmal atrial fibrillation) ([place]) ?Rate controlled with HR in the 60 ? ?Continue rate control with amiodarone  ?Close monitoring of pulmonary function.  ? ?Anticoagulation with apixaban.  ?Metoprolol has been discontinued due to bradycardia.  ?Continue telemetry monitoring.  ? ?Pneumonia due to COVID-19 virus ?Patient with viral pneumonia and worsening respiratory failure with hypoxemia. ?Acute on chronic hypoxemic respiratory failure.  ?Completed antiviral therapy  ? ?On IV systemic steroids 40 mg methylprednisolone.  ?Continue with inhaled corticosteroids.  ?Continue with bronchodilator therapy and will add mucolytic therapy.  ?Out of bed to chair tid with meals. ? ?Continue with airway clearing techniques with flutter valve and incentive spirometer.  ? ?Now patient of respiratory isolation, more than 10 days with no signs of active viral activity.  ? ?ILD (interstitial lung disease) ([place]) ?Acute on chronic hypoxemic respiratory failure. ?Oxygenation is 96% on 6 L /min per HFNC. ? ?Bronchodilator therapy and airway clearing techniques.  ?Patient on revenfenacin  ? ? ? ? ?Acute kidney injury superimposed on chronic kidney disease ([place]) ?Hyperkalemia, hyponatremia  ? ?Resumed diuresis with furosemide 20 mg daily ?Renal function today with serum cr at 1,14 with k at 5,3 and serum bicarbonate at 22. ? ?Continue close follow up on renal function and electrolytes.  ?Avoid hypotension and nephrotoxic medications.  ? ? ? ?Essential (primary) hypertension ?Blood pressure has been stable.  ?Continue rate control atrial fibrillation with amiodarone.  ? ? ?Type 2 diabetes mellitus with hyperlipidemia ([place]) ?Uncontrolled hyperglycemia.  ? ?Fasting glucose this am is 145.  ?  Continue pre- meal and basal insulin.  ?Continue statin therapy.  ?Patient is tolerating po well  ? ?Mood disorder ([place]) ?On Wellbutrin,  Cymbalta ? ?Hypothyroidism ?On levothyroxine  ? ?Hyperlipidemia/PAD ?-Continue Mevacor (pravastatin per formulary) ? ?Tobacco abuse ?Continue with nicotine patch. ? ? ?Breast mass ?Incidentally noted on CT chest, will need mammogram as outpatient when stable ? ? ? ? ?  ? ?Subjective: patient with improvement in dyspnea but no back to baseline, no chest pain and tolerating po well.  ? ?Physical Exam: ?Vitals:  ? 02/20/22 0407 02/20/22 0751 02/20/22 0846 02/20/22 1510  ?BP:  (!) 141/66    ?Pulse: (!) 58 65    ?Resp: 12 18    ?Temp:  (!) 97.5 ?F (36.4 ?C)    ?TempSrc:  Oral    ?SpO2: 100% 93% 95% 93%  ?Weight:      ?Height:      ? ?Neurology awake and alert ?ENT with no pallor ?Cardiovascular with S1 and S2 present and rhythmic with no gallops, positive murmur at the right sternal border, systolic ?No JVD ?No lower extremity edema ?Respiratory with bilateral rales, with no wheezing or rhonchi ?Abdomen soft  ?Data Reviewed: ? ? ? ?Family Communication: no family at the bedside  ? ?Disposition: ?Status is: Inpatient ?Remains inpatient appropriate because: respiratory failure  ? Planned Discharge Destination: Home ? ? ? ? ? ?Author: ?Tawni Millers, MD ?02/20/2022 3:14 PM ? ?For on call review [email or web address]  ?

## 2022-02-20 NOTE — Progress Notes (Signed)
Physical Therapy Treatment ?Patient Details ?Name: Elizabeth Melton ?MRN: [id number] ?DOB: August 25, 1941 ?Today's Date: 02/20/2022 ? ? ?History of Present Illness 81 y.o. female admitted 3/2 with SOB. Pt with AFib, COPD exacerbation and Covid (+). PMhx: interstitial pulmonary fibrosis, pulm HTN, chronic respiratory failure on 4 -5 L home O2, dCHF, DM, CKD, PAD, HTN, hypothyroidism, OSA on CPAP, paroxysmal A-fib, PE on Eliquis ? ?  ?PT Comments  ? ? Patient received in bed, she is agreeable to PT session. Patient with Nasal canula not properly in nose and O2 sats in 80%s on arrival. Patient is mod independent with bed mobility, needs increased time and effort to complete. Patient is very weak and limited by sob, O2 sats with mobility. She would benefit from SNF at discharge to improve strength and safety with mobility. Discharge rec. Updated to reflect. She will continue to benefit from skilled PT while here to improve strength, endurance and safety with mobility.  ?     ?Recommendations for follow up therapy are one component of a multi-disciplinary discharge planning process, led by the attending physician.  Recommendations may be updated based on patient status, additional functional criteria and insurance authorization. ? ?Follow Up Recommendations ? Skilled nursing-short term rehab (<3 hours/day) ?  ?  ?Assistance Recommended at Discharge Frequent or constant Supervision/Assistance  ?Patient can return home with the following A lot of help with walking and/or transfers;A lot of help with bathing/dressing/bathroom;Assist for transportation;Help with stairs or ramp for entrance;Assistance with cooking/housework ?  ?Equipment Recommendations ? None recommended by PT  ?  ?Recommendations for Other Services   ? ? ?  ?Precautions / Restrictions Precautions ?Precautions: Fall ?Precaution Comments: monitor O2- ?Restrictions ?Weight Bearing Restrictions: No  ?  ? ?Mobility ? Bed Mobility ?Overal bed mobility: Modified  Independent ?Bed Mobility: Supine to Sit ?  ?  ?Supine to sit: Modified independent (Device/Increase time) ?  ?  ?General bed mobility comments: increased time with reliance on rail and HOB 35 degrees ?  ? ?Transfers ?Overall transfer level: Needs assistance ?Equipment used: Rolling walker (2 wheels) ?Transfers: Sit to/from Stand, Bed to chair/wheelchair/BSC ?Sit to Stand: Min assist ?Stand pivot transfers: Min assist ?  ?Squat pivot transfers: Min assist ?  ?  ?General transfer comment: patient needs min A for transfers. Weak, increased time needed. Able to pivot to [facility] from bed without AD ?  ? ?Ambulation/Gait ?Ambulation/Gait assistance: Min guard ?Gait Distance (Feet): 20 Feet ?Assistive device: Rolling walker (2 wheels) ?Gait Pattern/deviations: Step-through pattern, Decreased step length - right, Decreased step length - left, Decreased stride length ?Gait velocity: decreased ?  ?  ?General Gait Details: patient needs cues for inhaling trhough nose. Ambulated on 6 liters initially, bumped up to 8 liters with sats down to 85% on 6 liters. Very slow, labored cadence with ambulation.  Patient sat unsafely, reaching over to chair prematurely needed mod/max assist to get bottom safely to recliner ? ? ?Stairs ?  ?  ?  ?  ?  ? ? ?Wheelchair Mobility ?  ? ?Modified Rankin (Stroke Patients Only) ?  ? ? ?  ?Balance Overall balance assessment: Needs assistance ?Sitting-balance support: Feet supported ?Sitting balance-Leahy Scale: Fair ?Sitting balance - Comments: EOb without support ?  ?Standing balance support: Bilateral upper extremity supported, During functional activity, Reliant on assistive device for balance ?Standing balance-Leahy Scale: Poor ?Standing balance comment: RW for gait ?  ?  ?  ?  ?  ?  ?  ?  ?  ?  ?  ?  ? ?  ?  Cognition Arousal/Alertness: Awake/alert ?Behavior During Therapy: [facility] for tasks assessed/performed ?Overall Cognitive Status: Within Functional Limits for tasks assessed ?  ?  ?  ?  ?  ?  ?  ?   ?  ?  ?  ?  ?  ?  ?  ?  ?  ?  ?  ? ?  ?Exercises   ? ?  ?General Comments   ?  ?  ? ?Pertinent Vitals/Pain Pain Assessment ?Pain Assessment: Faces ?Faces Pain Scale: Hurts even more ?Pain Location: groin area ?Pain Descriptors / Indicators: Discomfort, Sore, Tender  ? ? ?Home Living   ?  ?  ?  ?  ?  ?  ?  ?  ?  ?   ?  ?Prior Function    ?  ?  ?   ? ?PT Goals (current goals can now be found in the care plan section) Acute Rehab PT Goals ?Patient Stated Goal: return home ?PT Goal Formulation: With patient ?Time For Goal Achievement: 02/28/22 ?Potential to Achieve Goals: Fair ?Progress towards PT goals: Not progressing toward goals - comment (patient is very weak, limited by SOB, endurance) ? ?  ?Frequency ? ? ? Min 3X/week ? ? ? ?  ?PT Plan Discharge plan needs to be updated  ? ? ?Co-evaluation   ?  ?  ?  ?  ? ?  ?AM-PAC PT "6 Clicks" Mobility   ?Outcome Measure ? Help needed turning from your back to your side while in a flat bed without using bedrails?: A Little ?Help needed moving from lying on your back to sitting on the side of a flat bed without using bedrails?: A Little ?Help needed moving to and from a bed to a chair (including a wheelchair)?: A Lot ?Help needed standing up from a chair using your arms (e.g., wheelchair or bedside chair)?: A Lot ?Help needed to walk in hospital room?: A Lot ?Help needed climbing 3-5 steps with a railing? : Total ?6 Click Score: 13 ? ?  ?End of Session Equipment Utilized During Treatment: Gait belt;Oxygen ?Activity Tolerance: Patient limited by fatigue ?Patient left: in chair;with call bell/phone within reach;with chair alarm set ?Nurse Communication: Mobility status ?PT Visit Diagnosis: Muscle weakness (generalized) (M62.81);Difficulty in walking, not elsewhere classified (R26.2);Other abnormalities of gait and mobility (R26.89) ?  ? ? ?Time: 8875-7972 ?PT Time Calculation (min) (ACUTE ONLY): 32 min ? ?Charges:  $Gait Training: 8-22 mins ?$Therapeutic Activity: 8-22 mins           ?          ? ?Amanda Cockayne, PT, GCS ?02/20/22,3:20 PM ? ? ?

## 2022-02-20 NOTE — Progress Notes (Signed)
? ?NAME:  Elizabeth Melton, MRN:  [id number], DOB:  05/18/1941, LOS: 10 ?ADMISSION DATE:  02/09/2022, CONSULTATION DATE:  02/12/22 ?REFERRING MD:  Broadus John - [facility], CHIEF COMPLAINT:  Acute on chronic hypoxia   ? ?History of Present Illness:  ?81 yo F PMH chronic hypoxic respiratory failure (uses 4-6L at baseline), IPF, chronic diastolic HF, pulmonary hypertension, Afib, submassive PE, HTN, prior tobacco use who presented to ED 3/2 with increased HR and hypoxia after being seen by home health nurse. In ED, had converted out of Afib RVR to NSR, after a 5 second pause. She was hypoxic in ED requiring NRB. Admitted to [facility].  ?Hospital course marked by recurrent Afib RVR for which cardiology has been consulted, as well as increasing O2 requirement. She has been diuresed per cardiology guidance, and a CT chest which did not show significant progression of fibrosis compared to most recent imaging -- O2 requirement remains elevated, requiring 10L HFNC 3/5 ? ?PCCM is consulted in this setting ? ?Of note this is the patient's 3rd admission in 2023.  Palliative medicine saw her on 2/17 and the patient's family requested full scope of practice.  ? ?She tells me that she has had worsening cough and mucus production over the last few days.  Typically she will cough up a small amount of sputum in the mornings, but lately it has been more voluminous, darker, and bloodier than normal.  Also with increased dyspnea and wheezing.  Minimal leg swelling.  ? ?She is fully unvaccinated against COVID-19.  ? ?Pertinent  Medical History  ?IPF ?Chronic diastolic HF ?COPD ?Pulmonary hypertension  ?Submassive PE  ?DM2 ?Afib ?Hypothyroidism  ?CKD IIIA ?OSA ? ?Significant Hospital Events: ?Including procedures, antibiotic start and stop dates in addition to other pertinent events   ?3/2 to ED with incr HR and hypoxia after [facility] visit. Admit to [facility]. Cards consulted. Metop dc ?3/3 back in Afib -- started on IV amio. Restarted metop. Palli consult, full code   ?3/4 worse resp effort. Diuresed. Back in sinus ?3/5 pulm consult. Started demadex. Steroids  ? ?Interim History / Subjective:  ? ?No change, wants to go home. ? ? ?Objective   ?Blood pressure (!) 141/66, pulse 65, temperature (!) 97.5 ?F (36.4 ?C), temperature source Oral, resp. rate 18, height _0  (1.676 m), weight 72.9 kg, SpO2 95 %. ?   ?   ? ?Intake/Output Summary (Last 24 hours) at 02/20/2022 0911 ?Last data filed at 02/20/[phone] [id number] ?Gross per 24 hour  ?Intake 590 ml  ?Output 1100 ml  ?Net -510 ml  ? ?Filed Weights  ? 02/17/22 0629 02/18/22 0606 02/19/22 0609  ?Weight: 73 kg 72.7 kg 72.9 kg  ? ? ?Examination: ?General:  chronically ill appearing elderly F seated NAD  ?HENT: NCAT pink mm ?PULM: Even unlabored on 7L. Fine crackles  ?CV: rrr + systolic murmur  ?GI: ndnt  ?MSK: no acute joint deformity  ?Neuro: AAOx3 following commands ?Psych: calm, cooperative  ? ?Mild hyperkalemia otherwise BMP stable, no new imaging ? ?Resolved Hospital Problem list   ? ? ?Assessment & Plan:  ? ? ?Acute on chronic hypoxemic respiratory failure ?AECOPD ?COVID-19 infection with PNA ?IPF ?pHTN ?Diastolic HR ?OSA ?Discussion: ?-Likely AECOPD 2/2 covid infection. ?-Feels like she is improving  ?-s/p course of molnupiravir  ?Plan: ?-cont pred 62m (stop date ordered)  ?-brovana, budesonide, yupelri ?-mucinex ?-IS / flutter / mobility ?-wean O2 as able for goal >92 ?-amio, anticoag, diuresis per cards  ?- [facility]  2 puffs twice daily for inhaled therapies. Stop Stiolto per pulmonary clinic note.  ?- I have asked CM to verify that her home concentrator is capable of 6-8L continuous flow. If not we can set up with oxygen conserving device to support her O2 needs during exertion. ? ? ? ?Will follow while here and can facilitate pulm follow up if she is discharged soon ? ?Walker Shadow  ?[place] ? ?02/20/2022, 9:11 AM ? ? ? ? ? ?

## 2022-02-20 NOTE — Progress Notes (Signed)
Mobility Specialist Progress Note: ? ? 02/20/22 1640  ?Mobility  ?Activity Transferred to/from [facility]  ?Level of Assistance Minimal assist, patient does 75% or more  ?Assistive Device None  ?Distance Ambulated (ft) 4 ft  ?Activity Response Tolerated well  ?$Mobility charge 1 Mobility  ? ?Pt requesting to go to [facility] to void. Required minA to stand and pivot to [facility]. No assist required with pericare. Pt back in chair with all needs met.  ? ?Nelta Numbers ?Acute Rehab ?Phone: [phone] ?Office Phone: [phone] ? ?

## 2022-02-20 NOTE — Consult Note (Signed)
? ?  [facility] CM Inpatient Consult ? ? ?02/20/2022 ? ?Tisheena L Waltrip ?01/16/1941 ?[id number] ? ?[place] [facility] [ACO] Patient: [name] ? ? ?Primary Care Provider:  Kathyrn Lass, MD, is an Embedded provider at [facility] at [facility], which has a Chronic Care Management program with pharmacist, this provider is listed for the [facility] follow up and appointments ? ?Patient is currently active with [place] Management for chronic disease management services.  Patient has been engaged by a [place].  Our community based plan of care has focused on disease management and community resource support.  Patient was on droplet/ariborne precautions - COVID. Chart reviewed for needs for post hospital follow up. ? ?Plan: Will follow up with [place] regarding the patient's has an Embedded provider and pharmacist for post hospital needs. Patient's care may be managed by the Embedded CCM team for post hospital follow up. ? ?Of note, [facility] Care Management services does not replace or interfere with any services that are needed or arranged by inpatient [facility] care management team.  For additional questions or referrals please contact: ? ?Natividad Brood, RN BSN CCM ?[facility] Liaison ? [phone] business mobile phone ?Toll free office [phone]  ?Fax number: [phone] ?[email or web address] ? ?  ? ? ? ?

## 2022-02-20 NOTE — Care Management Important Message (Signed)
Important Message ? ?Patient Details  ?Name: Elizabeth Melton ?MRN: [id number] ?Date of Birth: [birth year] ? ? ?Medicare Important Message Given:  Yes ? ? ? ? ?[name] ?02/20/2022, 11:16 AM ?

## 2022-02-21 ENCOUNTER — Other Ambulatory Visit (HOSPITAL_COMMUNITY): Payer: Self-pay

## 2022-02-21 ENCOUNTER — Telehealth: Payer: Self-pay | Admitting: Student

## 2022-02-21 DIAGNOSIS — N179 Acute kidney failure, unspecified: Secondary | ICD-10-CM | POA: Diagnosis not present

## 2022-02-21 DIAGNOSIS — J849 Interstitial pulmonary disease, unspecified: Secondary | ICD-10-CM | POA: Diagnosis not present

## 2022-02-21 DIAGNOSIS — I480 Paroxysmal atrial fibrillation: Secondary | ICD-10-CM | POA: Diagnosis not present

## 2022-02-21 DIAGNOSIS — U071 COVID-19: Secondary | ICD-10-CM | POA: Diagnosis not present

## 2022-02-21 LAB — BASIC METABOLIC PANEL
Anion gap: 6 (ref 5–15)
BUN: 32 mg/dL — ABNORMAL HIGH (ref 8–23)
CO2: 24 mmol/L (ref 22–32)
Calcium: 8.6 mg/dL — ABNORMAL LOW (ref 8.9–10.3)
Chloride: 105 mmol/L (ref 98–111)
Creatinine, Ser: 1.2 mg/dL — ABNORMAL HIGH (ref 0.44–1.00)
GFR, Estimated: 46 mL/min — ABNORMAL LOW (ref >60)
Glucose, Bld: 261 mg/dL — ABNORMAL HIGH (ref 70–99)
Potassium: 4.9 mmol/L (ref 3.5–5.1)
Sodium: 135 mmol/L (ref 135–145)

## 2022-02-21 LAB — GLUCOSE, CAPILLARY
Glucose-Capillary: 281 mg/dL — ABNORMAL HIGH (ref 70–99)
Glucose-Capillary: 298 mg/dL — ABNORMAL HIGH (ref 70–99)
Glucose-Capillary: 260 mg/dL — ABNORMAL HIGH (ref 70–99)

## 2022-02-21 MED ORDER — FUROSEMIDE 20 MG PO TABS
20.0000 mg | ORAL_TABLET | Freq: Every day | ORAL | 0 refills | Status: DC
  Filled 2022-02-21: qty 30, 30d supply, fill #0

## 2022-02-21 MED ORDER — NYSTATIN 100000 UNIT/GM EX CREA
TOPICAL_CREAM | Freq: Two times a day (BID) | CUTANEOUS | Status: DC
  Filled 2022-02-21: qty 30

## 2022-02-21 MED ORDER — ALBUTEROL SULFATE (2.5 MG/3ML) 0.083% IN NEBU
3.0000 mL | INHALATION_SOLUTION | Freq: Four times a day (QID) | RESPIRATORY_TRACT | 0 refills | Status: AC | PRN
  Filled 2022-02-21: qty 180, 15d supply, fill #0

## 2022-02-21 MED ORDER — IPRATROPIUM-ALBUTEROL 0.5-2.5 (3) MG/3ML IN SOLN: 3.0000 mL | Freq: Four times a day (QID) | RESPIRATORY_TRACT | Status: DC | PRN

## 2022-02-21 MED ORDER — NYSTATIN 100000 UNIT/GM EX CREA
TOPICAL_CREAM | Freq: Two times a day (BID) | CUTANEOUS | 0 refills | Status: AC
  Filled 2022-02-21: qty 30, 14d supply, fill #0

## 2022-02-21 MED ORDER — POTASSIUM CHLORIDE ER 8 MEQ PO TBCR
8.0000 meq | EXTENDED_RELEASE_TABLET | Freq: Every day | ORAL | 0 refills | Status: DC
  Filled 2022-02-21: qty 30, 30d supply, fill #0

## 2022-02-21 MED ORDER — PREDNISONE 20 MG PO TABS: 40.0000 mg | ORAL_TABLET | Freq: Every day | ORAL | Status: DC

## 2022-02-21 MED ORDER — PREDNISONE 20 MG PO TABS
40.0000 mg | ORAL_TABLET | Freq: Every day | ORAL | 0 refills | Status: AC
  Filled 2022-02-21: qty 4, 2d supply, fill #0

## 2022-02-21 MED ORDER — AMIODARONE HCL 200 MG PO TABS
ORAL_TABLET | ORAL | 0 refills | Status: AC
  Filled 2022-02-21: qty 35, 30d supply, fill #0

## 2022-02-21 NOTE — Progress Notes (Signed)
Mobility Specialist Progress Note: ? ? 02/21/22 1130  ?Mobility  ?Activity Ambulated with assistance in hallway;Ambulated with assistance in room  ?Level of Assistance Contact guard assist, steadying assist  ?Assistive Device Front wheel walker  ?Distance Ambulated (ft) 50 ft  ?Activity Response Tolerated well  ?$Mobility charge 1 Mobility  ? ?Pt agreeable to mobility session this am. Required minA throughout session, VSS on 8LO2 (d/t tank settings). Pt left in chair with all needs met.  ? ?Nelta Numbers ?Acute Rehab ?Phone: [phone] ?Office Phone: [phone] ? ?

## 2022-02-21 NOTE — Progress Notes (Signed)
Discharge instructions given. Patient verbalized understanding and all questions were answered.  ?

## 2022-02-21 NOTE — Progress Notes (Signed)
Brief PCCM Progress Note ? ?# Acute on chronic hypoxemic respiratory failure ?# AECOPD in setting of covid-19 infection ?# Pulmonary hypertension ?# Moderate OSA on WatchPAT 12/27/21 awaiting CPAP titration ?- ok for discharge from our standpoint now that we have confirmed [employer] will change to concentrator capable of 10L O2 flow. I have updated O2 order to 6-8L per high flow nasal cannula continuous ?- last day of prednisone 40 mg is tomorrow ?- brovana, budesonide, yupelri while here ?- flutter valve 10 slow but firm puffs twice daily after bronchodilators ?- on discharge continue breztri 2 puffs twice daily, rinse mouth after use ?- lasix 20 per cardiology, application pending for tyvaso ?- consideration of antifibrotics on outpatient basis per Dr. [name] ?- will ensure she has pulmonary clinic follow up ? ?Will sign off, glad to be reinvolved as condition changes ? ?Walker Shadow  ?[place] ?  ?

## 2022-02-21 NOTE — TOC Progression Note (Signed)
Transition of Care (TOC) - Progression Note  ? ? ?Patient Details  ?Name: Elizabeth Melton ?MRN: [id number] ?Date of Birth: [birth year] ? ?Transition of Care (TOC) CM/SW Contact  ?Ninfa Meeker, RN ?Phone Number: ?02/21/2022, 9:12 AM ? ?Clinical Narrative:  Case manager contacted [employer] per MD request to confirm if her present home concentrator would be adequate for 6-8L continous flow. Per Fredrich Birks , her current concentrator only supports 5L, they will swap out for a 10L concentrator. New oxygen order is needed, MD has been notified. TOC Team will continue to monitor.   ? ? ? ?Expected Discharge Plan: [place] ?Barriers to Discharge: Continued Medical Work up ? ?Expected Discharge Plan and Services ?Expected Discharge Plan: [facility] ?In-house Referral: NA ?Discharge Planning Services: CM Consult ?Post Acute Care Choice: Home Health ?Living arrangements for the past 2 months: [place] ?                ?  ?DME Agency: NA ?  ?  ?  ?HH Arranged: RN, PT, OT, Disease Management ?[place] Agency: [place] ?Date HH Agency Contacted: 02/15/22 ?Time [place]: 1962 ?Representative spoke with at [place]: Tommi Rumps ? ? ?Social Determinants of Health (SDOH) Interventions ?  ? ?Readmission Risk Interventions ?Readmission Risk Prevention Plan 02/15/2022 09/10/2020  ?Post Dischage Appt - Complete  ?Medication Screening - Complete  ?Transportation Screening Complete Complete  ?Medication Review Press photographer) Complete -  ?PCP or Specialist appointment within 3-5 days of discharge Complete -  ?[place] or Home Care Consult Complete -  ?SW Recovery Care/Counseling Consult Complete -  ?Palliative Care Screening Not Applicable -  ?[place] Not Applicable -  ?Some recent data might be hidden  ? ? ?

## 2022-02-21 NOTE — Discharge Summary (Addendum)
Physician Discharge Summary   Patient: Elizabeth Melton MRN: [id number] DOB: January 15, 1941  Admit date:     02/09/2022  Discharge date: 02/21/22  Discharge Physician: Elizabeth Melton   PCP: Elizabeth Hazel, MD   Recommendations at discharge:    Patient will continue with one more day of oral prednisone. Continue bronchodilator therapy.  Airway clearing techniques with flutter valve and incentive spirometer. Continue with Breztri 2 puffs bid Continue furosemide 20 mg daily to keep negative fluid balance. Follow up with outpatient mammogram for left breast 9 mm mass   Discharge Diagnoses: Principal Problem:   PAF (paroxysmal atrial fibrillation) (HCC) Active Problems:   ILD (interstitial lung disease) (HCC)   Pneumonia due to COVID-19 virus   Acute kidney injury superimposed on chronic kidney disease (HCC)   Type 2 diabetes mellitus with hyperlipidemia (HCC)   Essential (primary) hypertension   Mood disorder (HCC)   Hypothyroidism   Tobacco abuse   Breast mass  Resolved Problems:   * No resolved hospital problems. [facility] Course: [name] was admitted to the hospital with the working diagnosis of acute on chronic hypoxemic respiratory failure due to decompensated heart failure in the setting of pulmonary fibrosis and SARS covid 19 viral pneumonia.   81 yo female with the past medical history of IPF, COPD, heart failure, pulmonary hypertension, atrial fibrillation, T2DM, pulmonary embolism and chronic hypoxemic respiratory failure who presented with dyspnea. At home patient had worsening dyspnea, worsening oxygenation despite supplemental 02. Home nurse called EMS, her heart rate was greater than 200 (atrial fibrillation) and patient was transported to the hospital. At the time of her arrival her heart rate improved to 90, blood pressure 112/51, rr 10 and 02 saturation 96%, lungs with diffuse rhonchi but no wheezing, heart with S1 and S2 present and rhythmic, no gallops,  abdomen soft and no lower extremity edema.  Na 140, K 4,3 Cl 111, bicarb 24 glucose 126, bun 55 and cr 2,8 Ca I 1,43  Wbc 10,2 hgb 13,9 hct 43, 5 plt 162  SARS covid 19 positive   Chest radiograph with bilateral increased lung markings, enlarged right ventricle/ right atrium.   CT chest with emphysema and interseptal thickening, with honey combing, consistent with pulmonary fibrosis.  9 mm right upper outer breat mass.  Similar multiple pulmonary nodules.   EKG 91 bpm, normal axis, normal intervals, sinus rhythm with right atrial enlargement, no significant ST segment or T wave changes.   Started on steroids and malnupiravir for COVID 19 infection  03/03 patient had recurrent atrial fibrillation with RVR and was placed on amiodarone infusion.  03/13 heart rate is controlled, sinus rhythm. Slowly improving oxygenation but continue with high oxygen requirements. Discontinue respiratory isolation, more than 10 days since positive test and no signs of active viral infection   03/14 patient with improvement in dyspnea, plan to discharge home on high flow oxygen, and follow up as outpatient with pulmonary and cardiology.      Assessment and Plan: * PAF (paroxysmal atrial fibrillation) (HCC) Patient was admitted to the cardiac unit. She required amiodarone and metoprolol for rate control. Patient converted to sinus rhythm with bradycardia. Metoprolol was discontinued with good toleration.   Continue anticoagulation with apixaban.  Considering pulmonary fibrosis, patient will need close outpatient follow up with ILD clinic.   Acute diastolic heart failure (present on admission), responded well to diuresis.  Echocardiogram from last year with preserved LV systolic function.    Pneumonia due to COVID-19 virus  Patient with viral pneumonia and worsening respiratory failure with hypoxemia. Acute on chronic hypoxemic respiratory failure.  Completed antiviral therapy during her  hospitalization   Patient was placed on systemic and inhaled corticosteroids with good toleration.  Bronchodilator therapy and mucolytic therapy.  Airway clearing techniques with flutter valve and incentive spirometer.   Patient will complete steroid therapy on 03/15 and plan to follow up as outpatient. Today she is more than 10 days of her initial positive test, respiratory isolation has been discontinued.   ILD (interstitial lung disease) (HCC) Acute on chronic hypoxemic respiratory failure. Patient was placed on high flow nasal cannula. High oxygen requirements that have been improving.  At the time of her discharge she is on 6L HFNC with 02 saturation 99%.   Plan to continue oxymetry monitoring and supplemental 02 per [place].  Keep 02 saturation 88% or greater.     Acute kidney injury superimposed on chronic kidney disease (HCC) Hyperkalemia, hyponatremia   Patient responded well to diuresis with improvement in volume status. At discharge her renal function has a serum cr of 1,20 with K at 4,9 and serum bicarbonate at 24.  Plan to continue diuresis with furosemide and follow up renal function as outpatient.      Essential (primary) hypertension Blood pressure has been stable. Currently off antihypertensive medications.  Continue diuresis with low dose furosemide.    Type 2 diabetes mellitus with hyperlipidemia (HCC) Uncontrolled hyperglycemia. Steroid induced hyperglycemia.   At the time of her discharge her fasting glucose was 261. Patient received insulin therapy, basal and pre-meal. Sliding scale.  Tolerating well. Continue to wean off steroids.   Mood disorder (HCC) On Wellbutrin, Cymbalta  Hypothyroidism On levothyroxine   Hyperlipidemia/PAD -Continue Mevacor (pravastatin per formulary)  Tobacco abuse Continue with nicotine patch.   Breast mass Incidentally noted on CT chest, will need mammogram as outpatient when stable         Consultants:  pulmonary and cardiology  Procedures performed: none   Disposition: Home Diet recommendation:  Cardiac diet DISCHARGE MEDICATION: Allergies as of 02/21/2022       Reactions   Lisinopril Other (See Comments)   Other reaction(s): cough 09/20/17        Medication List     STOP taking these medications    buPROPion 150 MG 24 hr tablet Commonly known as: WELLBUTRIN XL   Calcium + D3 600-200 MG-UNIT Tabs   Fish Oil 1000 MG Caps   KLOR-CON PO   metoprolol succinate 25 MG 24 hr tablet Commonly known as: TOPROL-XL   OXYGEN   predniSONE 10 MG (21) Tbpk tablet Commonly known as: STERAPRED UNI-PAK 21 TAB Replaced by: predniSONE 20 MG tablet   torsemide 20 MG tablet Commonly known as: DEMADEX   Tyvaso DPI Titration Kit 16 & 32 & 48 MCG Powd Generic drug: Treprostinil   VITAMIN D PO       TAKE these medications    acetaminophen 500 MG tablet Commonly known as: TYLENOL Take 1,000 mg by mouth every 8 (eight) hours as needed for headache or mild pain (pain).   albuterol 108 (90 Base) MCG/ACT inhaler Commonly known as: VENTOLIN HFA Inhale 2 puffs into the lungs every 6 (six) hours as needed for wheezing or shortness of breath. What changed: Another medication with the same name was changed. Make sure you understand how and when to take each.   albuterol (2.5 MG/3ML) 0.083% nebulizer solution Commonly known as: PROVENTIL Take 3 mLs by nebulization every 6 (six) hours  as needed. What changed:  when to take this reasons to take this   amiodarone 200 MG tablet Commonly known as: PACERONE Take 1 tablet by mouth twice daily until 02/26/22, then on 02/27/22 start taking 1 tablet daily.   apixaban 5 MG Tabs tablet Commonly known as: ELIQUIS Take 1 tablet (5 mg total) by mouth 2 (two) times daily.   b complex vitamins tablet Take 1 tablet by mouth daily.   bisacodyl 10 MG suppository Commonly known as: DULCOLAX Place 10 mg rectally daily as needed for moderate  constipation.   Breztri Aerosphere 160-9-4.8 MCG/ACT Aero Generic drug: Budeson-Glycopyrrol-Formoterol Inhale 2 puffs into the lungs in the morning and at bedtime.   diphenhydrAMINE 25 MG tablet Commonly known as: BENADRYL Take 25 mg by mouth every 6 (six) hours as needed for allergies.   DULoxetine 30 MG capsule Commonly known as: CYMBALTA Take 1 capsule (30 mg total) by mouth daily.   furosemide 20 MG tablet Commonly known as: LASIX Take 1 tablet (20 mg total) by mouth daily. Start taking on: February 22, 2022   glucose blood test strip 1 each by Other route as needed for other (blood sugar).   guaiFENesin 600 MG 12 hr tablet Commonly known as: MUCINEX Take 1 tablet (600 mg total) by mouth 2 (two) times daily. What changed: additional instructions   insulin glargine 100 UNIT/ML injection Commonly known as: LANTUS Inject 10 Units into the skin daily.   levothyroxine 75 MCG tablet Commonly known as: SYNTHROID Take 75 mcg by mouth daily before breakfast.   lidocaine 5 % Commonly known as: LIDODERM Place 1 patch onto the skin at bedtime. Remove & Discard patch within 12 hours or as directed by MD   lovastatin 40 MG tablet Commonly known as: MEVACOR Take 40 mg by mouth at bedtime.   Methocarbamol 1000 MG Tabs Take 1,000 mg by mouth every 8 (eight) hours as needed for muscle spasms.   nicotine 14 mg/24hr patch Commonly known as: NICODERM CQ - dosed in mg/24 hours Place 14 mg onto the skin daily.   nystatin cream Commonly known as: MYCOSTATIN Apply topically 2 (two) times daily.   pantoprazole 40 MG tablet Commonly known as: PROTONIX Take 1 tablet (40 mg total) by mouth daily.   PEN NEEDLES 29GX1/2" 29G X Misc For insulin injection   predniSONE 20 MG tablet Commonly known as: DELTASONE Take 2 tablets (40 mg total) by mouth daily with breakfast for 2 days. Start taking on: February 22, 2022 Replaces: predniSONE 10 MG (21) Tbpk tablet   senna-docusate 8.6-50  MG tablet Commonly known as: Senokot-S Take 2 tablets by mouth at bedtime. What changed: additional instructions   Smart Sense Thin Lancets 26G Misc 1 each by Does not apply route 2 (two) times daily.   traMADol 50 MG tablet Commonly known as: ULTRAM Take 1 tablet (50 mg total) by mouth every 6 (six) hours as needed for moderate pain.   True Metrix Meter w/Device Kit 1 each by Other route in the morning and at bedtime.               Durable Medical Equipment  (From admission, onward)           Start     Ordered   02/21/22 0928  For home use only DME oxygen  Once       Comments: Needs O2 per high flow nasal cannula 6-8L continuous  Question Answer Comment  Length of Need Lifetime  Mode or (Route) Nasal cannula   Frequency Continuous (stationary and portable oxygen unit needed)   Oxygen delivery system Gas      02/21/22 0928   02/21/22 0000  For home use only DME Nebulizer machine       Question Answer Comment  Patient needs a nebulizer to treat with the following condition ILD (interstitial lung disease) (HCC)   Length of Need 12 Months      02/21/22 1204            Follow-up Information     [facility], [facility] Follow up.   Specialty: Home Health Services Why: HHRN,HHPT,HHOT - resume services Contact information: [address] [place] [address] [phone]         Elizabeth Hazel, MD. Go on 02/27/2022.   Specialty: Family Medicine Why: 10:45am please arrive @10 :30am,bring insurance card,and wear a mask. Contact information: [address] [place] [address] [phone]         Newman Nip, NP Follow up on 03/01/2022.   Specialties: Nurse Practitioner, Cardiology Why: in our a fib clinic, this is same area as Dr. Alford Highland office.  please call for parking code.   this if for your atrial fib Contact information: [address] [place] [address] [phone]         Laurey Morale, MD Follow up on  03/28/2022.   Specialty: Cardiology Why: at 10:40 AM  call for parking code. Contact information: [address] [place] [address] [phone]                Discharge Exam: Ceasar Mons Weights   02/18/22 0606 02/19/22 0609 02/21/22 0348  Weight: 72.7 kg 72.9 kg 71.8 kg   BP (!) 145/53 (BP Location: Left Arm)   Pulse 68   Temp 97.8 F (36.6 C) (Oral)   Resp 19   Ht 5\' 6"  (1.676 m)   Wt 71.8 kg Comment: scale c  SpO2 99%   BMI 25.55 kg/m   Patient is feeling better, dyspnea is improving.  Neurology awake and alert ENT with no pallor Cardiovascular with S1 and S2 present and rhythmic with no gallops or murmurs, no rubs No JVD No lower extremity edema Respiratory with scattered rales but not wheezing  Abdomen soft and non tender    Condition at discharge: stable  The results of significant diagnostics from this hospitalization (including imaging, microbiology, ancillary and laboratory) are listed below for reference.   Imaging Studies: DG Chest 2 View  Result Date: 01/25/2022 CLINICAL DATA:  Pulmonary fibrosis EXAM: CHEST - 2 VIEW COMPARISON:  01/09/2022 FINDINGS: Frontal and lateral views of the chest demonstrates stable enlargement of the cardiac silhouette. Stable basilar predominant pulmonary fibrosis. No acute airspace disease, effusion, or pneumothorax. No acute bony abnormalities. IMPRESSION: 1. Stable pulmonary fibrosis.  No acute airspace disease. Electronically Signed   By: Sharlet Salina M.D.   On: 01/25/2022 23:59   CT CHEST WO CONTRAST  Result Date: 02/11/2022 CLINICAL DATA:  Dyspnea, chronic, unclear etiology EXAM: CT CHEST WITHOUT CONTRAST TECHNIQUE: Multidetector CT imaging of the chest was performed following the standard protocol without IV contrast. RADIATION DOSE REDUCTION: This exam was performed according to the departmental dose-optimization program which includes automated exposure control, adjustment of the mA and/or kV according to patient  size and/or use of iterative reconstruction technique. COMPARISON:  July 13, 2021. FINDINGS: Cardiovascular: Cardiomegaly. Dense calcifications of the aortic valve. Severe atherosclerotic calcifications of the aorta. Predominately LEFT-sided  coronary artery atherosclerotic calcifications. No pericardial effusion. Enlargement of the main pulmonary artery in relation to the ascending thoracic aorta likely reflects underlying pulmonary arterial hypertension. Mediastinum/Nodes: Similar appearance of mediastinal adenopathy. This is similar comparison to prior with representative pretracheal lymph node measuring 25 mm in the short axis, unchanged (series 4, image 52). Coarsely calcified mediastinal lymph nodes. No axillary adenopathy. Lungs/Pleura: No pleural effusion or pneumothorax. Revisualization of subpleural reticulation, traction bronchiectasis, architectural distortion and widespread areas of ground-glass attenuation. There is a favored upper lobe predominant distribution. There is superimposed centrilobular and likely paraseptal emphysema. There are scattered more focal nodular opacities, stable in comparison to prior. Representative 7 mm pulmonary nodule of the RIGHT upper lobe is stable in comparison to prior (series 8, image 76). Representative RIGHT lower lobe subpleural nodule opacity is stable in comparison to prior (series 4, image 83). Overall extent of pulmonary fibrosis is not significantly changed in comparison to most recent prior but appears overall progressed since 2020. Upper Abdomen: Cholelithiasis. Subcentimeter hypodense lesions are too small to accurately characterize. No acute abnormality visualized. Musculoskeletal: Possible 9 mm RIGHT upper outer breast mass (series 3, image 25). New mild wedging of T12 with mild retropulsion of the superior endplate into the spinal column. IMPRESSION: 1. Revisualization of pulmonary fibrosis, similar in comparison most recent prior but overall progressed  since 2020. 2. Age-indeterminate wedging of T12 with mild retropulsion of the superior endplate into the spinal canal. Recommend correlation with point tenderness. 3. There is a possible 9 mm RIGHT upper outer breast mass. Recommend correlation with mammographic history. 4. Similar appearance of multiple pulmonary nodules. Recommend continued close attention on follow-up, due August 2023 5. Enlargement of the main pulmonary artery in relation to the ascending thoracic aorta likely reflects underlying pulmonary arterial hypertension. Aortic Atherosclerosis (ICD10-I70.0) and Emphysema (ICD10-J43.9). Electronically Signed   By: Meda Klinefelter M.D.   On: 02/11/2022 14:24   DG Chest Port 1 View  Result Date: 02/09/2022 CLINICAL DATA:  Cardiac arrhythmia. EXAM: PORTABLE CHEST 1 VIEW COMPARISON:  January 25, 2022. FINDINGS: Stable cardiomegaly. Stable interstitial densities are noted throughout both lungs concerning for fibrosis or chronic scarring. Bony thorax is unremarkable. IMPRESSION: Stable cardiomegaly. Stable bilateral interstitial densities are noted concerning for fibrosis or chronic scarring. Electronically Signed   By: Lupita Raider M.D.   On: 02/09/2022 15:55   DG Chest Port 1 View  Result Date: 01/25/2022 CLINICAL DATA:  Shortness of breath, respiratory distress EXAM: PORTABLE CHEST 1 VIEW COMPARISON:  01/09/2022 FINDINGS: Single frontal view of the chest demonstrates an enlarged cardiac silhouette. There are chronic changes of pulmonary fibrosis again noted, basilar predominant. No acute airspace disease, effusion, or pneumothorax. No acute bony abnormalities. IMPRESSION: 1. Stable pulmonary fibrosis.  No acute airspace disease. Electronically Signed   By: Sharlet Salina M.D.   On: 01/25/2022 23:58    Microbiology: Results for orders placed or performed during the hospital encounter of 02/09/22  Resp Panel by RT-PCR (Flu A&B, Covid) Nasopharyngeal Swab     Status: Abnormal   Collection  Time: 02/09/22  2:59 PM   Specimen: Nasopharyngeal Swab; Nasopharyngeal(NP) swabs in vial transport medium  Result Value Ref Range Status   SARS Coronavirus 2 by RT PCR POSITIVE (A) NEGATIVE Final    Comment: (NOTE) SARS-CoV-2 target nucleic acids are DETECTED.  The SARS-CoV-2 RNA is generally detectable in upper respiratory specimens during the acute phase of infection. Positive results are indicative of the presence of the identified virus, but do not  rule out bacterial infection or co-infection with other pathogens not detected by the test. Clinical correlation with patient history and other diagnostic information is necessary to determine patient infection status. The expected result is Negative.  Fact Sheet for Patients: BloggerCourse.com  Fact Sheet for Healthcare Providers: SeriousBroker.it  This test is not yet approved or cleared by the [place] FDA and  has been authorized for detection and/or diagnosis of SARS-CoV-2 by FDA under an Emergency Use Authorization (EUA).  This EUA will remain in effect (meaning this test can be used) for the duration of  the COVID-19 declaration under Section 564(b)(1) of the A ct, 21 U.S.C. section 360bbb-3(b)(1), unless the authorization is terminated or revoked sooner.     Influenza A by PCR NEGATIVE NEGATIVE Final   Influenza B by PCR NEGATIVE NEGATIVE Final    Comment: (NOTE) The Xpert Xpress SARS-CoV-2/FLU/RSV plus assay is intended as an aid in the diagnosis of influenza from Nasopharyngeal swab specimens and should not be used as a sole basis for treatment. Nasal washings and aspirates are unacceptable for Xpert Xpress SARS-CoV-2/FLU/RSV testing.  Fact Sheet for Patients: BloggerCourse.com  Fact Sheet for Healthcare Providers: SeriousBroker.it  This test is not yet approved or cleared by the [place] FDA and has been  authorized for detection and/or diagnosis of SARS-CoV-2 by FDA under an Emergency Use Authorization (EUA). This EUA will remain in effect (meaning this test can be used) for the duration of the COVID-19 declaration under Section 564(b)(1) of the Act, 21 U.S.C. section 360bbb-3(b)(1), unless the authorization is terminated or revoked.  Performed at [facility], [address], [place], [place] [address]     Labs: CBC: Recent Labs  Lab 02/15/22 0425  WBC 10.4  HGB 14.6  HCT 45.0  MCV 101.8*  PLT 196   Basic Metabolic Panel: Recent Labs  Lab 02/16/22 0450 02/17/22 0331 02/18/22 0245 02/20/22 0428 02/21/22 0336  NA 136 134* 135 133* 135  K 5.0 4.8 4.5 5.3* 4.9  CL 104 101 104 103 105  CO2 23 25 24 22 24   GLUCOSE 252* 141* 129* 145* 261*  BUN 67* 52* 44* 28* 32*  CREATININE 1.79* 1.37* 1.23* 1.14* 1.20*  CALCIUM 9.0 9.2 8.9 9.0 8.6*  MG  --  2.5*  --   --   --    Liver Function Tests: No results for input(s): AST, ALT, ALKPHOS, BILITOT, PROT, ALBUMIN in the last 168 hours. CBG: Recent Labs  Lab 02/20/22 1158 02/20/22 1551 02/20/22 2104 02/21/22 0442 02/21/22 1142  GLUCAP 269* 93 168* 281* 298*    Discharge time spent: greater than 30 minutes.  Signed: Coralie Keens, MD [facility] 02/21/2022

## 2022-02-21 NOTE — Telephone Encounter (Signed)
Can we set up pulmonary clinic follow up for Elizabeth Melton with either APP or Ramaswamy, hospital follow up in 2 weeks or so? ? ?Thanks! ?

## 2022-02-21 NOTE — Telephone Encounter (Signed)
Redirecting this to front desk ?

## 2022-02-21 NOTE — Telephone Encounter (Signed)
Patient has pending appt 02/28/2022. ?Spoke to patient and she stated that she plans to keep that appt. ?Nothing further needed. ?  ?

## 2022-02-24 ENCOUNTER — Other Ambulatory Visit: Payer: Self-pay

## 2022-02-24 NOTE — Patient Outreach (Signed)
[facility]) Care Management ? ?02/24/2022 ? ?Elizabeth Melton ?1941-04-08 ?[id number] ? ? ?Telephone Assessment ? ? ?Unsuccessful outreach attempt to patient. No answer after several rings and voicemail box full.  ? ? ? ?Plan: ?RN CM will make outreach attempt within 4 business days. ? ?Enzo Montgomery, RN,BSN,CCM ?[facility] Care Management ?Telephonic Care Management Coordinator ?Direct Phone: [phone] ?Toll Free: [phone] ?Fax: [phone] ? ?

## 2022-02-26 ENCOUNTER — Emergency Department (HOSPITAL_COMMUNITY): Payer: Medicare HMO

## 2022-02-26 ENCOUNTER — Other Ambulatory Visit: Payer: Self-pay

## 2022-02-26 ENCOUNTER — Inpatient Hospital Stay (HOSPITAL_COMMUNITY)
Admission: EM | Admit: 2022-02-26 | Discharge: 2022-03-02 | DRG: 189 | Disposition: A | Discharge status: Hospice | Payer: Medicare HMO | Attending: Internal Medicine | Admitting: Internal Medicine

## 2022-02-26 ENCOUNTER — Encounter (HOSPITAL_BASED_OUTPATIENT_CLINIC_OR_DEPARTMENT_OTHER): Payer: Medicare HMO | Admitting: Cardiology

## 2022-02-26 ENCOUNTER — Encounter (HOSPITAL_COMMUNITY): Payer: Self-pay

## 2022-02-26 DIAGNOSIS — N6311 Unspecified lump in the right breast, upper outer quadrant: Secondary | ICD-10-CM | POA: Diagnosis present

## 2022-02-26 DIAGNOSIS — Z888 Allergy status to other drugs, medicaments and biological substances status: Secondary | ICD-10-CM

## 2022-02-26 DIAGNOSIS — Z8249 Family history of ischemic heart disease and other diseases of the circulatory system: Secondary | ICD-10-CM

## 2022-02-26 DIAGNOSIS — I161 Hypertensive emergency: Secondary | ICD-10-CM | POA: Diagnosis present

## 2022-02-26 DIAGNOSIS — E1151 Type 2 diabetes mellitus with diabetic peripheral angiopathy without gangrene: Secondary | ICD-10-CM | POA: Diagnosis present

## 2022-02-26 DIAGNOSIS — N63 Unspecified lump in unspecified breast: Secondary | ICD-10-CM | POA: Diagnosis not present

## 2022-02-26 DIAGNOSIS — Z515 Encounter for palliative care: Secondary | ICD-10-CM | POA: Diagnosis not present

## 2022-02-26 DIAGNOSIS — L89151 Pressure ulcer of sacral region, stage 1: Secondary | ICD-10-CM | POA: Diagnosis present

## 2022-02-26 DIAGNOSIS — Z86711 Personal history of pulmonary embolism: Secondary | ICD-10-CM | POA: Diagnosis not present

## 2022-02-26 DIAGNOSIS — R54 Age-related physical debility: Secondary | ICD-10-CM | POA: Diagnosis present

## 2022-02-26 DIAGNOSIS — E1169 Type 2 diabetes mellitus with other specified complication: Secondary | ICD-10-CM | POA: Diagnosis present

## 2022-02-26 DIAGNOSIS — Z7901 Long term (current) use of anticoagulants: Secondary | ICD-10-CM

## 2022-02-26 DIAGNOSIS — Z79890 Hormone replacement therapy (postmenopausal): Secondary | ICD-10-CM

## 2022-02-26 DIAGNOSIS — Z83438 Family history of other disorder of lipoprotein metabolism and other lipidemia: Secondary | ICD-10-CM

## 2022-02-26 DIAGNOSIS — Z720 Tobacco use: Secondary | ICD-10-CM | POA: Diagnosis present

## 2022-02-26 DIAGNOSIS — Z841 Family history of disorders of kidney and ureter: Secondary | ICD-10-CM

## 2022-02-26 DIAGNOSIS — Z20822 Contact with and (suspected) exposure to covid-19: Secondary | ICD-10-CM | POA: Diagnosis present

## 2022-02-26 DIAGNOSIS — Z7189 Other specified counseling: Secondary | ICD-10-CM | POA: Diagnosis not present

## 2022-02-26 DIAGNOSIS — I50811 Acute right heart failure: Secondary | ICD-10-CM | POA: Diagnosis present

## 2022-02-26 DIAGNOSIS — R0602 Shortness of breath: Secondary | ICD-10-CM | POA: Diagnosis not present

## 2022-02-26 DIAGNOSIS — I10 Essential (primary) hypertension: Secondary | ICD-10-CM | POA: Diagnosis present

## 2022-02-26 DIAGNOSIS — N1832 Chronic kidney disease, stage 3b: Secondary | ICD-10-CM | POA: Diagnosis present

## 2022-02-26 DIAGNOSIS — Z833 Family history of diabetes mellitus: Secondary | ICD-10-CM

## 2022-02-26 DIAGNOSIS — I2721 Secondary pulmonary arterial hypertension: Secondary | ICD-10-CM | POA: Diagnosis present

## 2022-02-26 DIAGNOSIS — J9621 Acute and chronic respiratory failure with hypoxia: Secondary | ICD-10-CM | POA: Diagnosis present

## 2022-02-26 DIAGNOSIS — I480 Paroxysmal atrial fibrillation: Secondary | ICD-10-CM | POA: Diagnosis present

## 2022-02-26 DIAGNOSIS — Z66 Do not resuscitate: Secondary | ICD-10-CM | POA: Diagnosis not present

## 2022-02-26 DIAGNOSIS — L89159 Pressure ulcer of sacral region, unspecified stage: Secondary | ICD-10-CM | POA: Diagnosis present

## 2022-02-26 DIAGNOSIS — Z794 Long term (current) use of insulin: Secondary | ICD-10-CM

## 2022-02-26 DIAGNOSIS — J84112 Idiopathic pulmonary fibrosis: Secondary | ICD-10-CM | POA: Diagnosis present

## 2022-02-26 DIAGNOSIS — F17210 Nicotine dependence, cigarettes, uncomplicated: Secondary | ICD-10-CM | POA: Diagnosis present

## 2022-02-26 DIAGNOSIS — J90 Pleural effusion, not elsewhere classified: Secondary | ICD-10-CM | POA: Diagnosis not present

## 2022-02-26 DIAGNOSIS — I130 Hypertensive heart and chronic kidney disease with heart failure and stage 1 through stage 4 chronic kidney disease, or unspecified chronic kidney disease: Secondary | ICD-10-CM | POA: Diagnosis present

## 2022-02-26 DIAGNOSIS — Z79899 Other long term (current) drug therapy: Secondary | ICD-10-CM

## 2022-02-26 DIAGNOSIS — Z8616 Personal history of COVID-19: Secondary | ICD-10-CM | POA: Diagnosis not present

## 2022-02-26 DIAGNOSIS — J849 Interstitial pulmonary disease, unspecified: Secondary | ICD-10-CM | POA: Diagnosis not present

## 2022-02-26 DIAGNOSIS — Q211 Atrial septal defect, unspecified: Secondary | ICD-10-CM | POA: Diagnosis not present

## 2022-02-26 DIAGNOSIS — J8410 Pulmonary fibrosis, unspecified: Secondary | ICD-10-CM | POA: Diagnosis not present

## 2022-02-26 DIAGNOSIS — Z7951 Long term (current) use of inhaled steroids: Secondary | ICD-10-CM

## 2022-02-26 DIAGNOSIS — E039 Hypothyroidism, unspecified: Secondary | ICD-10-CM | POA: Diagnosis present

## 2022-02-26 DIAGNOSIS — E785 Hyperlipidemia, unspecified: Secondary | ICD-10-CM | POA: Diagnosis present

## 2022-02-26 DIAGNOSIS — I5032 Chronic diastolic (congestive) heart failure: Secondary | ICD-10-CM | POA: Diagnosis present

## 2022-02-26 DIAGNOSIS — R0902 Hypoxemia: Principal | ICD-10-CM

## 2022-02-26 DIAGNOSIS — J80 Acute respiratory distress syndrome: Secondary | ICD-10-CM | POA: Diagnosis not present

## 2022-02-26 DIAGNOSIS — J432 Centrilobular emphysema: Secondary | ICD-10-CM | POA: Diagnosis not present

## 2022-02-26 DIAGNOSIS — R918 Other nonspecific abnormal finding of lung field: Secondary | ICD-10-CM | POA: Diagnosis not present

## 2022-02-26 DIAGNOSIS — R0689 Other abnormalities of breathing: Secondary | ICD-10-CM | POA: Diagnosis not present

## 2022-02-26 DIAGNOSIS — J439 Emphysema, unspecified: Secondary | ICD-10-CM | POA: Diagnosis present

## 2022-02-26 DIAGNOSIS — J479 Bronchiectasis, uncomplicated: Secondary | ICD-10-CM | POA: Diagnosis not present

## 2022-02-26 LAB — CBC WITH DIFFERENTIAL/PLATELET
Abs Immature Granulocytes: 0.14 10*3/uL — ABNORMAL HIGH (ref 0.00–0.07)
Basophils Absolute: 0 10*3/uL (ref 0.0–0.1)
Basophils Relative: 0 %
Eosinophils Absolute: 0 10*3/uL (ref 0.0–0.5)
Eosinophils Relative: 0 %
HCT: 43.1 % (ref 36.0–46.0)
Hemoglobin: 14.2 g/dL (ref 12.0–15.0)
Immature Granulocytes: 1 %
Lymphocytes Relative: 6 %
Lymphs Abs: 0.8 10*3/uL (ref 0.7–4.0)
MCH: 34.1 pg — ABNORMAL HIGH (ref 26.0–34.0)
MCHC: 32.9 g/dL (ref 30.0–36.0)
MCV: 103.4 fL — ABNORMAL HIGH (ref 80.0–100.0)
Monocytes Absolute: 0.5 10*3/uL (ref 0.1–1.0)
Monocytes Relative: 3 %
Neutro Abs: 12.1 10*3/uL — ABNORMAL HIGH (ref 1.7–7.7)
Neutrophils Relative %: 90 %
Platelets: 145 10*3/uL — ABNORMAL LOW (ref 150–400)
RBC: 4.17 MIL/uL (ref 3.87–5.11)
RDW: 18.9 % — ABNORMAL HIGH (ref 11.5–15.5)
WBC: 13.4 10*3/uL — ABNORMAL HIGH (ref 4.0–10.5)
nRBC: 0 % (ref 0.0–0.2)

## 2022-02-26 LAB — BLOOD GAS, VENOUS
Acid-base deficit: 4.5 mmol/L — ABNORMAL HIGH (ref 0.0–2.0)
Bicarbonate: 21.7 mmol/L (ref 20.0–28.0)
O2 Saturation: 62.1 %
Patient temperature: 36.9
pCO2, Ven: 43 mmHg — ABNORMAL LOW (ref 44–60)
pH, Ven: 7.31 (ref 7.25–7.43)
pO2, Ven: 40 mmHg (ref 32–45)

## 2022-02-26 LAB — COMPREHENSIVE METABOLIC PANEL
ALT: 25 U/L (ref 0–44)
AST: 35 U/L (ref 15–41)
Albumin: 2.7 g/dL — ABNORMAL LOW (ref 3.5–5.0)
Alkaline Phosphatase: 58 U/L (ref 38–126)
Anion gap: 9 (ref 5–15)
BUN: 21 mg/dL (ref 8–23)
CO2: 21 mmol/L — ABNORMAL LOW (ref 22–32)
Calcium: 8.1 mg/dL — ABNORMAL LOW (ref 8.9–10.3)
Chloride: 106 mmol/L (ref 98–111)
Creatinine, Ser: 1.1 mg/dL — ABNORMAL HIGH (ref 0.44–1.00)
GFR, Estimated: 51 mL/min — ABNORMAL LOW (ref >60)
Glucose, Bld: 273 mg/dL — ABNORMAL HIGH (ref 70–99)
Potassium: 4.4 mmol/L (ref 3.5–5.1)
Sodium: 136 mmol/L (ref 135–145)
Total Bilirubin: 1.1 mg/dL (ref 0.3–1.2)
Total Protein: 5.6 g/dL — ABNORMAL LOW (ref 6.5–8.1)

## 2022-02-26 LAB — BRAIN NATRIURETIC PEPTIDE: B Natriuretic Peptide: 592.3 pg/mL — ABNORMAL HIGH (ref 0.0–100.0)

## 2022-02-26 LAB — RESP PANEL BY RT-PCR (FLU A&B, COVID) ARPGX2
Influenza A by PCR: NEGATIVE
Influenza B by PCR: NEGATIVE
SARS Coronavirus 2 by RT PCR: NEGATIVE

## 2022-02-26 LAB — TROPONIN I (HIGH SENSITIVITY): Troponin I (High Sensitivity): 32 ng/L — ABNORMAL HIGH (ref <18)

## 2022-02-26 MED ORDER — INSULIN ASPART 100 UNIT/ML IJ SOLN
0.0000 [IU] | INTRAMUSCULAR | Status: DC
  Administered 2022-02-27 (×2): 1 [IU] via SUBCUTANEOUS
  Administered 2022-02-27: 2 [IU] via SUBCUTANEOUS
  Administered 2022-02-27: 3 [IU] via SUBCUTANEOUS
  Administered 2022-02-27: 1 [IU] via SUBCUTANEOUS
  Administered 2022-02-28: 3 [IU] via SUBCUTANEOUS
  Administered 2022-02-28: 1 [IU] via SUBCUTANEOUS
  Administered 2022-02-28: 2 [IU] via SUBCUTANEOUS
  Administered 2022-03-01 (×2): 1 [IU] via SUBCUTANEOUS
  Administered 2022-03-01: 3 [IU] via SUBCUTANEOUS
  Administered 2022-03-01: 1 [IU] via SUBCUTANEOUS
  Administered 2022-03-01 – 2022-03-02 (×2): 2 [IU] via SUBCUTANEOUS

## 2022-02-26 MED ORDER — NICOTINE 14 MG/24HR TD PT24
14.0000 mg | MEDICATED_PATCH | Freq: Every day | TRANSDERMAL | Status: DC
  Filled 2022-02-26 (×3): qty 1

## 2022-02-26 MED ORDER — INSULIN GLARGINE-YFGN 100 UNIT/ML ~~LOC~~ SOLN
10.0000 [IU] | Freq: Every day | SUBCUTANEOUS | Status: DC
  Filled 2022-02-26: qty 0.1

## 2022-02-26 MED ORDER — INSULIN GLARGINE-YFGN 100 UNIT/ML ~~LOC~~ SOLN
5.0000 [IU] | Freq: Every day | SUBCUTANEOUS | Status: DC
Start: 2022-02-27 — End: 2022-03-02
  Administered 2022-02-27 – 2022-03-01 (×4): 5 [IU] via SUBCUTANEOUS
  Filled 2022-02-26 (×5): qty 0.05

## 2022-02-26 MED ORDER — FUROSEMIDE 10 MG/ML IJ SOLN
40.0000 mg | Freq: Two times a day (BID) | INTRAMUSCULAR | Status: DC
  Administered 2022-02-27 – 2022-03-01 (×5): 40 mg via INTRAVENOUS
  Filled 2022-02-26 (×6): qty 4

## 2022-02-26 MED ORDER — FUROSEMIDE 10 MG/ML IJ SOLN
40.0000 mg | Freq: Once | INTRAMUSCULAR | Status: AC
  Administered 2022-02-26: 40 mg via INTRAVENOUS
  Filled 2022-02-26: qty 4

## 2022-02-26 MED ORDER — FUROSEMIDE 10 MG/ML IJ SOLN: 20.0000 mg | Freq: Two times a day (BID) | INTRAMUSCULAR | Status: DC

## 2022-02-26 NOTE — Assessment & Plan Note (Addendum)
On insulin regimen at home ?

## 2022-02-26 NOTE — ED Triage Notes (Signed)
Pt found at home short of breath diaphoretic, o2 sats were 60% on 6 ltrs [place]. Placed patient on cpap and gave 3 nitro and patients o2 came up to 95%, bp lowered to 140/88.  Denies any pain just cant get her breath. ?

## 2022-02-26 NOTE — ED Provider Notes (Signed)
?[facility] ?Provider Note ? ?CSN: [id number] ?Arrival date & time: 02/26/22 2138 ? ?Chief Complaint(s) ?Shortness of Breath ? ?HPI ?Elizabeth Melton is a 81 y.o. female with PMH idiopathic pulmonary fibrosis, CHF, A-fib with RVR on amiodarone and Eliquis who presents emergency department for evaluation of shortness of breath.  Patient wears 6 L chronically at home and was found to be hypoxic and diaphoretic, hypertensive by EMS.  EMS placed the patient on CPAP and gave the patient 3 sublingual nitroglycerin tabs which improved her respiratory status and her diaphoresis.  On arrival, patient saturating 57% on home 6 L and placed back on BiPAP.  She endorses shortness of breath but denies chest pain, abdominal pain, nausea, vomiting or other systemic symptoms. ? ? ?Shortness of Breath ? ?Past Medical History ?Past Medical History:  ?Diagnosis Date  ? Acute on chronic diastolic (congestive) heart failure ([place]) 05/19/2021  ? Acute respiratory failure with hypoxia ([place]) 09/06/2020  ? Atrial fibrillation with RVR ([place])   ? Diabetes mellitus without complication ([place])   ? Hyperlipidemia   ? Hypertension   ? Hypothyroidism   ? IPF (idiopathic pulmonary fibrosis) ([place])   ? Pneumonia due to COVID-19 virus 01/16/2021  ? Thrombocytopenia ([place])   ? ?Patient Active Problem List  ? Diagnosis Date Noted  ? Acute kidney injury superimposed on chronic kidney disease ([place]) 02/15/2022  ? Breast mass 02/12/2022  ? Atrial flutter with rapid ventricular response ([place]) 02/10/2022  ? PAF (paroxysmal atrial fibrillation) ([place] ) 02/09/2022  ? COVID-19 virus infection 02/09/2022  ? Mood disorder ([place]) 02/09/2022  ? Chronic obstructive pulmonary disease with acute exacerbation ([place]) 01/26/2022  ? Acute renal failure superimposed on stage 3b chronic kidney disease ([place]) 01/26/2022  ? Thrombocytopenia ([place])   ? Elevated lactic acid level   ? Respiratory distress   ? Hypoxia   ? Low back pain 01/11/2022  ?  Acute respiratory failure ([place]) 12/29/2021  ? COPD with chronic bronchitis and emphysema ([place])   ? 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  12/28/2021  ? Hyperkalemia 12/28/2021  ? CKD (chronic kidney disease) 12/28/2021  ? Acute right-sided CHF (congestive heart failure) ([place]) 11/06/2021  ? Elevated troponin 11/03/2021  ? Constipation 11/03/2021  ? Pulmonary HTN ([place])   ? Acute on chronic systolic (congestive) heart failure ([place]) 05/19/2021  ? Acute on chronic respiratory failure with hypoxia ([place]) 05/19/2021  ? SOB (shortness of breath) 05/19/2021  ? AF (paroxysmal atrial fibrillation) ([place]) 05/19/2021  ? Acute on chronic congestive heart failure ([place])   ? Demand ischemia ([place])   ? Pneumonia due to COVID-19 virus 01/16/2021  ? Acute respiratory failure with hypoxia ([place]) 09/06/2020  ? 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 ILD (interstitial lung disease) ([place]) 10/23/2019  ? Chronic respiratory failure with hypoxia ([place]) 10/23/2019  ? Essential (primary) hypertension 09/18/2018  ? PAD (peripheral artery disease) ([place]) 09/18/2018  ? Tobacco abuse 09/18/2018  ? Aortic regurgitation 09/18/2018  ? Hypothyroidism   ? Hyperlipidemia/PAD   ? Type 2 diabetes mellitus with hyperlipidemia ([place])   ? ?Home Medication(s) ?Prior to Admission medications   ?Medication Sig Start Date End Date Taking? Authorizing Provider  ?acetaminophen (TYLENOL) 500 MG tablet Take 1,000 mg by mouth every 8 (eight) hours as needed for headache or mild pain (pain).    [provider]  ?albuterol (VENTOLIN HFA) 108 (90 Base) MCG/ACT inhaler Inhale 2 puffs into the lungs every 6 (six) hours as  needed for wheezing or shortness of breath. 02/08/22   Brand Males, MD  ?amiodarone (PACERONE) 200 MG tablet Take 1 tablet by mouth twice daily until 02/26/22, then on 02/27/22 start taking 1 tablet daily. 02/21/22   Arrien, Jimmy Picket, MD  ?apixaban (ELIQUIS) 5 MG TABS tablet Take 1 tablet (5  mg total) by mouth 2 (two) times daily. 11/24/21   Larey Dresser, MD  ?b complex vitamins tablet Take 1 tablet by mouth daily.    [provider]  ?bisacodyl (DULCOLAX) 10 MG suppository Place 10 mg rectally daily as needed for moderate constipation.    [provider]  ?Blood Glucose Monitoring Suppl (TRUE METRIX METER) w/Device KIT 1 each by Other route in the morning and at bedtime. 12/25/20   [provider]  ?Budeson-Glycopyrrol-Formoterol (BREZTRI AEROSPHERE) 160-9-4.8 MCG/ACT AERO Inhale 2 puffs into the lungs in the morning and at bedtime. 01/25/22   Cobb, Karie Schwalbe, NP  ?diphenhydrAMINE (BENADRYL) 25 MG tablet Take 25 mg by mouth every 6 (six) hours as needed for allergies.    [provider]  ?DULoxetine (CYMBALTA) 30 MG capsule Take 1 capsule (30 mg total) by mouth daily. 01/14/22   Domenic Polite, MD  ?furosemide (LASIX) 20 MG tablet Take 1 tablet (20 mg total) by mouth daily. 02/22/22 03/24/22  Arrien, Jimmy Picket, MD  ?glucose blood test strip 1 each by Other route as needed for other (blood sugar).    [provider]  ?guaiFENesin (MUCINEX) 600 MG 12 hr tablet Take 1 tablet (600 mg total) by mouth 2 (two) times daily. ?Patient taking differently: Take 600 mg by mouth 2 (two) times daily. As needed 01/13/22   Domenic Polite, MD  ?insulin glargine (LANTUS) 100 UNIT/ML injection Inject 10 Units into the skin daily.    [provider]  ?Insulin Pen Needle (PEN NEEDLES 29GX1/2") 29G X 12MM MISC For insulin injection 05/22/21   [name], MD  ?albuterol (PROVENTIL) (2.5 MG/3ML) 0.083% nebulizer solution Take 3 mLs by nebulization every 6 (six) hours as needed. 02/21/22 03/23/22  Arrien, Jimmy Picket, MD  ?levothyroxine (SYNTHROID, LEVOTHROID) 75 MCG tablet Take 75 mcg by mouth daily before breakfast.     [provider]  ?lidocaine (LIDODERM) 5 % Place 1 patch onto the skin at bedtime. Remove & Discard patch within 12 hours or as directed by  MD 01/13/22   Domenic Polite, MD  ?lovastatin (MEVACOR) 40 MG tablet Take 40 mg by mouth at bedtime.    [provider]  ?methocarbamol 1000 MG TABS Take 1,000 mg by mouth every 8 (eight) hours as needed for muscle spasms. 01/13/22   Domenic Polite, MD  ?nicotine (NICODERM CQ - DOSED IN MG/24 HOURS) 14 mg/24hr patch Place 14 mg onto the skin daily.    [provider]  ?nystatin cream (MYCOSTATIN) Apply topically 2 (two) times daily. 02/21/22   Arrien, Jimmy Picket, MD  ?pantoprazole (PROTONIX) 40 MG tablet Take 1 tablet (40 mg total) by mouth daily. 01/14/22   Domenic Polite, MD  ?senna-docusate (SENOKOT-S) 8.6-50 MG tablet Take 2 tablets by mouth at bedtime. ?Patient taking differently: Take 2 tablets by mouth at bedtime. As needed 01/13/22   Domenic Polite, MD  ?SMART SENSE THIN LANCETS 26G MISC 1 each by Does not apply route 2 (two) times daily.    [provider]  ?traMADol (ULTRAM) 50 MG tablet Take 1 tablet (50 mg total) by mouth every 6 (six) hours as needed for moderate pain. 01/13/22  Domenic Polite, MD  ?                                                                                                                                  ?Past Surgical History ?Past Surgical History:  ?Procedure Laterality Date  ? RIGHT HEART CATH N/A 07/05/2021  ? Procedure: RIGHT HEART CATH;  Surgeon: [name], MD;  Location: [facility];  Service: Cardiovascular;  Laterality: N/A;  ? RIGHT HEART CATH N/A 01/06/2022  ? Procedure: RIGHT HEART CATH;  Surgeon: Larey Dresser, MD;  Location: [facility];  Service: Cardiovascular;  Laterality: N/A;  ? RIGHT/LEFT HEART CATH AND CORONARY ANGIOGRAPHY N/A 11/23/2021  ? Procedure: RIGHT/LEFT HEART CATH AND CORONARY ANGIOGRAPHY;  Surgeon: Larey Dresser, MD;  Location: [facility];  Service: Cardiovascular;  Laterality: N/A;  ? TUBAL LIGATION    ? ?Family History ?Family History  ?Problem Relation Age of Onset  ? Diabetes Mother   ?  Kidney disease Mother   ? Hypertension Mother   ? Other Father   ?     tuberculosis  ? Hypertension Sister   ? Hyperlipidemia Sister   ? ? ?Social History ?Social History  ? ?Tobacco Use  ? Smoking status:

## 2022-02-26 NOTE — Assessment & Plan Note (Addendum)
Possibility of right-sided heart failure with elevated BNP and underlying pulmonary fibrosis.  Patient received IV Lasix during hospitalization.  Will transition to oral Lasix twice daily on discharge. ?

## 2022-02-26 NOTE — Assessment & Plan Note (Addendum)
Recurrent admission in the past for the same..  Diagnosis likely acute exacerbation of pulmonary fibrosis with congestive heart failure.  Received BiPAP during hospitalization and persist to remain on high flow nasal cannula 25 L or more.  Patient is extremely hypoxic even on minimal exertion.  Pulmonary also followed the patient during hospitalization.  At this time patient has overall declined. CT chest on 03/01/2022 showed emphysema and fibrosis with bilateral trace effusion and pulmonary nodular densities.  BNP elevated at 500.  Patient received IV steroids during hospitalization.  Palliative care was also consulted.  At this time plan is to transition to residential hospice. ?

## 2022-02-26 NOTE — Assessment & Plan Note (Addendum)
on nicotine patch. ?

## 2022-02-26 NOTE — Assessment & Plan Note (Addendum)
Continue Lasix on discharge ?

## 2022-02-26 NOTE — Assessment & Plan Note (Addendum)
On amiodarone and Eliquis as outpatient. ?

## 2022-02-26 NOTE — ED Notes (Signed)
Sent recollects ?

## 2022-02-26 NOTE — Assessment & Plan Note (Addendum)
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  Advanced disease with poor cardiopulmonary functioning.  Pulmonary saw the patient during hospitalization.  On high flow nasal cannula 25 L or so with extreme dyspnea on exertion in hypoxia.  Plans to transition to hospice level of care.   ?

## 2022-02-26 NOTE — Assessment & Plan Note (Addendum)
mammogram with 46m mass plan for hospice care ?

## 2022-02-27 ENCOUNTER — Inpatient Hospital Stay (HOSPITAL_COMMUNITY): Payer: Medicare HMO

## 2022-02-27 ENCOUNTER — Emergency Department (HOSPITAL_COMMUNITY): Payer: Medicare HMO

## 2022-02-27 ENCOUNTER — Other Ambulatory Visit: Payer: Self-pay

## 2022-02-27 DIAGNOSIS — J84112 Idiopathic pulmonary fibrosis: Secondary | ICD-10-CM | POA: Diagnosis present

## 2022-02-27 DIAGNOSIS — N1832 Chronic kidney disease, stage 3b: Secondary | ICD-10-CM | POA: Diagnosis present

## 2022-02-27 DIAGNOSIS — Z66 Do not resuscitate: Secondary | ICD-10-CM | POA: Diagnosis not present

## 2022-02-27 DIAGNOSIS — I5032 Chronic diastolic (congestive) heart failure: Secondary | ICD-10-CM | POA: Diagnosis present

## 2022-02-27 DIAGNOSIS — I50811 Acute right heart failure: Secondary | ICD-10-CM | POA: Diagnosis present

## 2022-02-27 DIAGNOSIS — I161 Hypertensive emergency: Secondary | ICD-10-CM | POA: Diagnosis present

## 2022-02-27 DIAGNOSIS — Z86711 Personal history of pulmonary embolism: Secondary | ICD-10-CM | POA: Diagnosis not present

## 2022-02-27 DIAGNOSIS — F17210 Nicotine dependence, cigarettes, uncomplicated: Secondary | ICD-10-CM | POA: Diagnosis present

## 2022-02-27 DIAGNOSIS — N63 Unspecified lump in unspecified breast: Secondary | ICD-10-CM | POA: Diagnosis not present

## 2022-02-27 DIAGNOSIS — J9621 Acute and chronic respiratory failure with hypoxia: Secondary | ICD-10-CM | POA: Diagnosis present

## 2022-02-27 DIAGNOSIS — Q211 Atrial septal defect, unspecified: Secondary | ICD-10-CM | POA: Diagnosis not present

## 2022-02-27 DIAGNOSIS — Z888 Allergy status to other drugs, medicaments and biological substances status: Secondary | ICD-10-CM | POA: Diagnosis not present

## 2022-02-27 DIAGNOSIS — J439 Emphysema, unspecified: Secondary | ICD-10-CM | POA: Diagnosis present

## 2022-02-27 DIAGNOSIS — Z8249 Family history of ischemic heart disease and other diseases of the circulatory system: Secondary | ICD-10-CM | POA: Diagnosis not present

## 2022-02-27 DIAGNOSIS — Z20822 Contact with and (suspected) exposure to covid-19: Secondary | ICD-10-CM | POA: Diagnosis present

## 2022-02-27 DIAGNOSIS — Z7189 Other specified counseling: Secondary | ICD-10-CM | POA: Diagnosis not present

## 2022-02-27 DIAGNOSIS — I480 Paroxysmal atrial fibrillation: Secondary | ICD-10-CM | POA: Diagnosis present

## 2022-02-27 DIAGNOSIS — Z515 Encounter for palliative care: Secondary | ICD-10-CM | POA: Diagnosis not present

## 2022-02-27 DIAGNOSIS — Z8616 Personal history of COVID-19: Secondary | ICD-10-CM | POA: Diagnosis not present

## 2022-02-27 DIAGNOSIS — N6311 Unspecified lump in the right breast, upper outer quadrant: Secondary | ICD-10-CM | POA: Diagnosis present

## 2022-02-27 DIAGNOSIS — E785 Hyperlipidemia, unspecified: Secondary | ICD-10-CM

## 2022-02-27 DIAGNOSIS — I10 Essential (primary) hypertension: Secondary | ICD-10-CM | POA: Diagnosis not present

## 2022-02-27 DIAGNOSIS — J849 Interstitial pulmonary disease, unspecified: Secondary | ICD-10-CM

## 2022-02-27 DIAGNOSIS — L89159 Pressure ulcer of sacral region, unspecified stage: Secondary | ICD-10-CM | POA: Diagnosis present

## 2022-02-27 DIAGNOSIS — Z720 Tobacco use: Secondary | ICD-10-CM

## 2022-02-27 DIAGNOSIS — L89151 Pressure ulcer of sacral region, stage 1: Secondary | ICD-10-CM | POA: Diagnosis present

## 2022-02-27 DIAGNOSIS — Z83438 Family history of other disorder of lipoprotein metabolism and other lipidemia: Secondary | ICD-10-CM | POA: Diagnosis not present

## 2022-02-27 DIAGNOSIS — E1169 Type 2 diabetes mellitus with other specified complication: Secondary | ICD-10-CM

## 2022-02-27 DIAGNOSIS — I130 Hypertensive heart and chronic kidney disease with heart failure and stage 1 through stage 4 chronic kidney disease, or unspecified chronic kidney disease: Secondary | ICD-10-CM | POA: Diagnosis present

## 2022-02-27 DIAGNOSIS — I2721 Secondary pulmonary arterial hypertension: Secondary | ICD-10-CM | POA: Diagnosis present

## 2022-02-27 DIAGNOSIS — E039 Hypothyroidism, unspecified: Secondary | ICD-10-CM | POA: Diagnosis present

## 2022-02-27 LAB — RESPIRATORY PANEL BY PCR

## 2022-02-27 LAB — CBC
HCT: 40 % (ref 36.0–46.0)
Hemoglobin: 13.4 g/dL (ref 12.0–15.0)
MCH: 33.8 pg (ref 26.0–34.0)
MCHC: 33.5 g/dL (ref 30.0–36.0)
MCV: 100.8 fL — ABNORMAL HIGH (ref 80.0–100.0)
Platelets: 117 10*3/uL — ABNORMAL LOW (ref 150–400)
RBC: 3.97 MIL/uL (ref 3.87–5.11)
RDW: 17.1 % — ABNORMAL HIGH (ref 11.5–15.5)
WBC: 14.4 10*3/uL — ABNORMAL HIGH (ref 4.0–10.5)
nRBC: 0 % (ref 0.0–0.2)

## 2022-02-27 LAB — I-STAT ARTERIAL BLOOD GAS, ED
Acid-base deficit: 1 mmol/L (ref 0.0–2.0)
Bicarbonate: 22.3 mmol/L (ref 20.0–28.0)
Calcium, Ion: 1.19 mmol/L (ref 1.15–1.40)
HCT: 40 % (ref 36.0–46.0)
Hemoglobin: 13.6 g/dL (ref 12.0–15.0)
O2 Saturation: 96 %
Patient temperature: 98.6
Potassium: 4.3 mmol/L (ref 3.5–5.1)
Sodium: 137 mmol/L (ref 135–145)
TCO2: 23 mmol/L (ref 22–32)
pCO2 arterial: 34 mmHg (ref 32–48)
pH, Arterial: 7.425 (ref 7.35–7.45)
pO2, Arterial: 78 mmHg — ABNORMAL LOW (ref 83–108)

## 2022-02-27 LAB — HEMOGLOBIN A1C
Hgb A1c MFr Bld: 7.8 % — ABNORMAL HIGH (ref 4.8–5.6)
Mean Plasma Glucose: 177.16 mg/dL

## 2022-02-27 LAB — PROCALCITONIN: Procalcitonin: <0.1 ng/mL

## 2022-02-27 LAB — CBG MONITORING, ED: Glucose-Capillary: 227 mg/dL — ABNORMAL HIGH (ref 70–99)

## 2022-02-27 LAB — BASIC METABOLIC PANEL
Anion gap: 10 (ref 5–15)
BUN: 22 mg/dL (ref 8–23)
CO2: 19 mmol/L — ABNORMAL LOW (ref 22–32)
Calcium: 8 mg/dL — ABNORMAL LOW (ref 8.9–10.3)
Chloride: 107 mmol/L (ref 98–111)
Creatinine, Ser: 1.01 mg/dL — ABNORMAL HIGH (ref 0.44–1.00)
GFR, Estimated: 57 mL/min — ABNORMAL LOW (ref >60)
Glucose, Bld: 237 mg/dL — ABNORMAL HIGH (ref 70–99)
Potassium: 4.4 mmol/L (ref 3.5–5.1)
Sodium: 136 mmol/L (ref 135–145)

## 2022-02-27 LAB — D-DIMER, QUANTITATIVE: D-Dimer, Quant: 0.73 ug/mL-FEU — ABNORMAL HIGH (ref 0.00–0.50)

## 2022-02-27 LAB — GLUCOSE, CAPILLARY
Glucose-Capillary: 121 mg/dL — ABNORMAL HIGH (ref 70–99)
Glucose-Capillary: 141 mg/dL — ABNORMAL HIGH (ref 70–99)
Glucose-Capillary: 142 mg/dL — ABNORMAL HIGH (ref 70–99)
Glucose-Capillary: 193 mg/dL — ABNORMAL HIGH (ref 70–99)
Glucose-Capillary: 204 mg/dL — ABNORMAL HIGH (ref 70–99)
Glucose-Capillary: 94 mg/dL (ref 70–99)

## 2022-02-27 LAB — CK: Total CK: 39 U/L (ref 38–234)

## 2022-02-27 LAB — TROPONIN I (HIGH SENSITIVITY)
Troponin I (High Sensitivity): 64 ng/L — ABNORMAL HIGH (ref <18)
Troponin I (High Sensitivity): 56 ng/L — ABNORMAL HIGH (ref <18)
Troponin I (High Sensitivity): 45 ng/L — ABNORMAL HIGH (ref <18)

## 2022-02-27 MED ORDER — PANTOPRAZOLE SODIUM 40 MG PO TBEC
40.0000 mg | DELAYED_RELEASE_TABLET | Freq: Every day | ORAL | Status: DC
  Administered 2022-02-27 – 2022-03-02 (×4): 40 mg via ORAL
  Filled 2022-02-27 (×4): qty 1

## 2022-02-27 MED ORDER — UMECLIDINIUM BROMIDE 62.5 MCG/ACT IN AEPB
1.0000 | INHALATION_SPRAY | Freq: Every day | RESPIRATORY_TRACT | Status: DC
  Filled 2022-02-27: qty 7

## 2022-02-27 MED ORDER — BUDESON-GLYCOPYRROL-FORMOTEROL 160-9-4.8 MCG/ACT IN AERO: 2.0000 | INHALATION_SPRAY | Freq: Two times a day (BID) | RESPIRATORY_TRACT | Status: DC

## 2022-02-27 MED ORDER — SODIUM CHLORIDE 0.9% FLUSH
3.0000 mL | Freq: Two times a day (BID) | INTRAVENOUS | Status: DC
  Administered 2022-02-27 – 2022-03-02 (×8): 3 mL via INTRAVENOUS

## 2022-02-27 MED ORDER — REVEFENACIN 175 MCG/3ML IN SOLN
175.0000 ug | Freq: Every day | RESPIRATORY_TRACT | Status: DC
  Administered 2022-02-28 – 2022-03-02 (×3): 175 ug via RESPIRATORY_TRACT
  Filled 2022-02-27 (×5): qty 3

## 2022-02-27 MED ORDER — ARFORMOTEROL TARTRATE 15 MCG/2ML IN NEBU: 15.0000 ug | INHALATION_SOLUTION | Freq: Two times a day (BID) | RESPIRATORY_TRACT | Status: DC

## 2022-02-27 MED ORDER — APIXABAN 5 MG PO TABS
5.0000 mg | ORAL_TABLET | Freq: Two times a day (BID) | ORAL | Status: DC
  Administered 2022-02-27 – 2022-03-02 (×6): 5 mg via ORAL
  Filled 2022-02-27 (×6): qty 1

## 2022-02-27 MED ORDER — LIDOCAINE 5 % EX PTCH
1.0000 | MEDICATED_PATCH | Freq: Every day | CUTANEOUS | Status: DC
  Administered 2022-02-28 – 2022-03-01 (×2): 1 via TRANSDERMAL
  Filled 2022-02-27 (×3): qty 1

## 2022-02-27 MED ORDER — BUDESONIDE 0.25 MG/2ML IN SUSP
0.2500 mg | Freq: Two times a day (BID) | RESPIRATORY_TRACT | Status: DC
  Administered 2022-02-27 – 2022-03-02 (×6): 0.25 mg via RESPIRATORY_TRACT
  Filled 2022-02-27 (×6): qty 2

## 2022-02-27 MED ORDER — ALBUTEROL SULFATE HFA 108 (90 BASE) MCG/ACT IN AERS
2.0000 | INHALATION_SPRAY | Freq: Four times a day (QID) | RESPIRATORY_TRACT | Status: DC | PRN
  Filled 2022-02-27: qty 6.7

## 2022-02-27 MED ORDER — ARFORMOTEROL TARTRATE 15 MCG/2ML IN NEBU
15.0000 ug | INHALATION_SOLUTION | Freq: Two times a day (BID) | RESPIRATORY_TRACT | Status: DC
  Administered 2022-02-27 – 2022-03-02 (×6): 15 ug via RESPIRATORY_TRACT
  Filled 2022-02-27 (×6): qty 2

## 2022-02-27 MED ORDER — SENNOSIDES-DOCUSATE SODIUM 8.6-50 MG PO TABS
2.0000 | ORAL_TABLET | Freq: Every day | ORAL | Status: DC
  Administered 2022-02-27 – 2022-03-01 (×3): 2 via ORAL
  Filled 2022-02-27 (×3): qty 2

## 2022-02-27 MED ORDER — BISACODYL 10 MG RE SUPP: 10.0000 mg | Freq: Every day | RECTAL | Status: DC | PRN

## 2022-02-27 MED ORDER — LEVOTHYROXINE SODIUM 75 MCG PO TABS
75.0000 ug | ORAL_TABLET | Freq: Every day | ORAL | Status: DC
  Administered 2022-02-28 – 2022-03-02 (×3): 75 ug via ORAL
  Filled 2022-02-27 (×3): qty 1

## 2022-02-27 MED ORDER — GUAIFENESIN ER 600 MG PO TB12
600.0000 mg | ORAL_TABLET | Freq: Two times a day (BID) | ORAL | Status: DC
  Administered 2022-02-27 – 2022-03-02 (×6): 600 mg via ORAL
  Filled 2022-02-27 (×6): qty 1

## 2022-02-27 MED ORDER — FLUTICASONE FUROATE-VILANTEROL 200-25 MCG/ACT IN AEPB
1.0000 | INHALATION_SPRAY | Freq: Every day | RESPIRATORY_TRACT | Status: DC
  Filled 2022-02-27: qty 28

## 2022-02-27 MED ORDER — TRAMADOL HCL 50 MG PO TABS
50.0000 mg | ORAL_TABLET | Freq: Four times a day (QID) | ORAL | Status: DC | PRN
  Administered 2022-02-28 – 2022-03-02 (×4): 50 mg via ORAL
  Filled 2022-02-27 (×4): qty 1

## 2022-02-27 MED ORDER — ZINC OXIDE 12.8 % EX OINT
TOPICAL_OINTMENT | Freq: Three times a day (TID) | CUTANEOUS | Status: DC
  Administered 2022-03-02: 1 via TOPICAL
  Filled 2022-02-27 (×2): qty 56.7

## 2022-02-27 MED ORDER — IPRATROPIUM-ALBUTEROL 0.5-2.5 (3) MG/3ML IN SOLN
3.0000 mL | Freq: Four times a day (QID) | RESPIRATORY_TRACT | Status: DC
Start: 2022-02-27 — End: 2022-02-27

## 2022-02-27 MED ORDER — ALBUTEROL SULFATE (2.5 MG/3ML) 0.083% IN NEBU: 3.0000 mL | INHALATION_SOLUTION | Freq: Four times a day (QID) | RESPIRATORY_TRACT | Status: DC | PRN

## 2022-02-27 MED ORDER — SODIUM CHLORIDE 0.9 % IV SOLN: 250.0000 mL | INTRAVENOUS | Status: DC | PRN

## 2022-02-27 MED ORDER — ACETAMINOPHEN 325 MG PO TABS
650.0000 mg | ORAL_TABLET | ORAL | Status: DC | PRN
  Administered 2022-02-27: 650 mg via ORAL
  Filled 2022-02-27: qty 2

## 2022-02-27 MED ORDER — IPRATROPIUM-ALBUTEROL 0.5-2.5 (3) MG/3ML IN SOLN: 3.0000 mL | RESPIRATORY_TRACT | Status: DC | PRN

## 2022-02-27 MED ORDER — DULOXETINE HCL 30 MG PO CPEP
30.0000 mg | ORAL_CAPSULE | Freq: Every day | ORAL | Status: DC
  Administered 2022-02-27 – 2022-03-02 (×3): 30 mg via ORAL
  Filled 2022-02-27 (×4): qty 1

## 2022-02-27 MED ORDER — AMIODARONE HCL 200 MG PO TABS
200.0000 mg | ORAL_TABLET | Freq: Every day | ORAL | Status: DC
  Administered 2022-02-27 – 2022-03-02 (×4): 200 mg via ORAL
  Filled 2022-02-27 (×4): qty 1

## 2022-02-27 MED ORDER — ONDANSETRON HCL 4 MG/2ML IJ SOLN: 4.0000 mg | Freq: Four times a day (QID) | INTRAMUSCULAR | Status: DC | PRN

## 2022-02-27 MED ORDER — SODIUM CHLORIDE 0.9% FLUSH
3.0000 mL | INTRAVENOUS | Status: DC | PRN
Start: 2022-02-26 — End: 2022-03-02
  Administered 2022-03-01: 3 mL via INTRAVENOUS

## 2022-02-27 NOTE — Patient Outreach (Signed)
[facility]) Care Management ? ?02/27/2022 ? ?Elizabeth Melton ?[birth year] ?[id number] ? ? ?Telephone Assessment ? ? ? ?Upon chart review noted that patient currently inpatient.  ? ? ? ?Plan: ?RN CM will continue to follow for discharge plan/disposition.  ? ?Enzo Montgomery, RN,BSN,CCM ?[facility] Care Management ?Telephonic Care Management Coordinator ?Direct Phone: [phone] ?Toll Free: [phone] ?Fax: [phone] ? ?

## 2022-02-27 NOTE — Consult Note (Signed)
WOC Nurse Consult Note: ?Patient receiving care in [place]. Primary RN in with me for assessment. ?Reason for Consult: sacral wound ?Wound type: Intergluteal and bilateral scattered MASD-ITD,IAD ?Pressure Injury POA: Yes/No/NA ?Measurement: na ?Wound bed: pink, shallow ?Drainage (amount, consistency, odor) none ?Periwound: intact ?Dressing procedure/placement/frequency: ?Apply Triple Paste to buttocks, sacrum, coccyx areas AFTER cleansing with soap and water. ? ?Monitor the wound area(s) for worsening of condition such as: ?Signs/symptoms of infection,  ?Increase in size,  ?Development of or worsening of odor, ?Development of pain, or increased pain at the affected locations.  Notify the medical team if any of these develop. ? ?Thank you for the consult.  Discussed plan of care with the patient and bedside nurse.  [place] nurse will not follow at this time.  Please re-consult the [facility] team if needed. ? ?Val Riles, RN, MSN, CWOCN, CNS-BC, pager [phone]  ? ?  ?

## 2022-02-27 NOTE — Plan of Care (Signed)
?  Problem: Education: ?Goal: Knowledge of General Education information will improve ?Description: Including pain rating scale, medication(s)/side effects and non-pharmacologic comfort measures ?Outcome: Progressing ?  ?Problem: Health Behavior/Discharge Planning: ?Goal: Ability to manage health-related needs will improve ?Outcome: Progressing ?  ?Problem: Clinical Measurements: ?Goal: Ability to maintain clinical measurements within normal limits will improve ?Outcome: Progressing ?Goal: Will remain free from infection ?Outcome: Progressing ?Goal: Diagnostic test results will improve ?Outcome: Progressing ?Goal: Respiratory complications will improve ?Outcome: Progressing ?Goal: Cardiovascular complication will be avoided ?Outcome: Progressing ?  ?Problem: Activity: ?Goal: Risk for activity intolerance will decrease ?Outcome: Progressing ?  ?Problem: Nutrition: ?Goal: Adequate nutrition will be maintained ?Outcome: Progressing ?  ?Problem: Coping: ?Goal: Level of anxiety will decrease ?Outcome: Progressing ?  ?Problem: Elimination: ?Goal: Will not experience complications related to bowel motility ?Outcome: Progressing ?Goal: Will not experience complications related to urinary retention ?Outcome: Progressing ?  ?Problem: Pain Managment: ?Goal: General experience of comfort will improve ?Outcome: Progressing ?  ?Problem: Safety: ?Goal: Ability to remain free from injury will improve ?Outcome: Progressing ?  ?Problem: Skin Integrity: ?Goal: Risk for impaired skin integrity will decrease ?Outcome: Progressing ?  ?

## 2022-02-27 NOTE — Progress Notes (Signed)
Heart Failure Navigator Progress Note ? ?Assessed for Heart & Vascular TOC clinic readiness.  ?Patient does not meet criteria due to prior to hospitalization patient followed by AHF clinic, Dr. Aundra Dubin.  ? ?Navigator available for educational resources. ? ?Pricilla Holm, MSN, RN ?Heart Failure Nurse Navigator ?[phone] ? ? ?

## 2022-02-27 NOTE — Progress Notes (Signed)
[facility]) Hospital Liaison note: ? ?This patient is currently enrolled in [facility] outpatient-based Palliative Care. Will continue to follow for disposition. ?\ ?Please call with any outpatient palliative questions or concerns. ? ?Thank you, ?Lorelee Market, LPN ?[facility] Hospital Liaison ?[phone] ?

## 2022-02-27 NOTE — Assessment & Plan Note (Addendum)
Continue wound care. ?

## 2022-02-27 NOTE — Progress Notes (Signed)
Pt transported by RT and RN from ED 34 to CT 1 w/ no complications ?

## 2022-02-27 NOTE — Significant Event (Addendum)
CT with findings concerning for possible AEIPF, specifically: "interval increase in patchy consolidative airspace opacities ?within the peripheral lower lobes and lower segments of the upper ?Lobes" ? ?Sending message to pulm for AM consult and to see if they want me to start steroids at this time. ? ?Update: spoke with Dr. Burnard Leigh with PCCM: no steroids for the moment since shes doing okay on BIPAP, not really sure that theres much difference in the CT today to suggest AEIPF. ?

## 2022-02-27 NOTE — Consult Note (Addendum)
? ?NAME:  Elizabeth Melton, MRN:  [id number], DOB:  11-01-1941, LOS: 0 ?ADMISSION DATE:  02/26/2022, CONSULTATION DATE:  02/27/2022 ?REFERRING MD:  Pokhrel, CHIEF COMPLAINT:  IPF flare, dyspnea  ? ?History of Present Illness:  ?Elizabeth Melton is a 81 y.o. female current some day smoker with medical history significant of A.Fib, PE, on eliquis, HTN, IPF with UIP, dCHF, PAH recently admitted earlier in March for acute on chronic dCHF, COVID-19, and A.Fib RVR rate controlled with amiodarone.  She was discharged on 6 L [place].  She was brought to the ED by EMS on 02/26/2022 with severe hypoxic respiratory failure , HTN with SBP in the 200's, and falls. She was given NTG x 3 and placed on CPAP by EMS.  ?In the ED BP was better controlled after NTG. She was also given 40 mg Lasix for suspected decompensated CHF . She is currently on BiPAP. PCCM have been asked to see as a pulmonary consult for suggestions to manage IPF. She is followed in the pulmonary clinic by Dr. [name]. She has had multiple admissions for acute on chronic Respiratory Failure over the last 2 months . ?Pertinent  Medical History  ? ?Past Medical History:  ?Diagnosis Date  ? Acute on chronic diastolic (congestive) heart failure ([place]) 05/19/2021  ? Acute respiratory failure with hypoxia ([place]) 09/06/2020  ? Atrial fibrillation with RVR ([place])   ? Diabetes mellitus without complication ([place])   ? Hyperlipidemia   ? Hypertension   ? Hypothyroidism   ? IPF (idiopathic pulmonary fibrosis) ([place])   ? Pneumonia due to COVID-19 virus 01/16/2021  ? Thrombocytopenia ([place])   ?  ? ?Significant Hospital Events: ?Including procedures, antibiotic start and stop dates in addition to other pertinent events   ?02/27/2022 Admission to [facility]  ?CT Chest 02/27/2022>> Emphysema and fibrosis with likely developing superimposed infection/inflammation. Bilateral trace pleural effusions, pulmonary nodules measuring 7 mm RUL, Persistent nodular-like subpleural densities within the  inferior aspect of the right lower lobe and superior aspect of the right lower lobe measuring up to 21 x 13 mm. Stable mediastinal lymphadenopathy, cardiomegaly, Enlarged main pulmonary artery suggestive of pulmonary hypertension, Persistent 13 x 8 mm right breast soft tissue density. Recommend correlation with mammography ?Right Cardiac Cath >> Optimized filling pressures.  ?Severe pulmonary arterial hypertension ? ?Interim History / Subjective:  ?States she is breathing better but she is hungry and would like to come off  BiPAP. ?Labs Reviewed: ?D dimer 0.73/ Troponin 64/ PCT 0.10/ HGB A1C 7.8/ Creatinine 1.01/ WBC 14.4/ HGB 13.4/ Platelets 117/  ?Net negative 1550 since admission  ?Last PFT 07/2021 with DLCO of 30 % ? ?Objective   ?Blood pressure (!) 130/46, pulse (!) 52, temperature 97.9 ?F (36.6 ?C), temperature source Axillary, resp. rate 13, height _0  (1.676 m), weight 73.8 kg, SpO2 95 %. ?   ?FiO2 (%):  [40 %-100 %] 40 %  ? ?Intake/Output Summary (Last 24 hours) at 02/27/2022 1339 ?Last data filed at 02/27/2022 1053 ?Gross per 24 hour  ?Intake --  ?Output 1550 ml  ?Net -1550 ml  ? ?Filed Weights  ? 02/26/22 2156 02/27/22 0219  ?Weight: (!) 159 kg 73.8 kg  ? ? ?Examination: ?General: Awake, and alert, On BiPAP , In NAD ?HENT:  NCAT, No LAD, + JVD ?Lungs: Bilateral chest excursion , crackles per bases,  ?Cardiovascular: SB per tele, RRR, No RMG ?Abdomen:  Soft, NT, ND,BS +, Body mass index is 26.26 kg/m?.  ?Extremities: Nop obvious deformities, 1+ BLE  edema ?Neuro:  Awake , alert and oriented x 3, MAE x 4 ?GU:  No Foley cath ? ?Resolved Hospital Problem list   ? None  ? ?Assessment & Plan:  ?Acute Chronic Respiratory Failure 2/2 IPF Flare vs Flash Pulmonary Edema vs PAH  vs infection   ?Recent Covid 19 and  ? Progressive New scarring  ?6 L [place] at home prior to Admission  ?? Medication Compliance  ?On Amiodarone with IPF ?Current Come Day smoker ?Plan ?ILD panel, follow for results ?Viral Respiratory Panel  follow results ?Continue Diuresis as renal function allows  ?Consult cardiology and evaluate other options for rate control  other than amiodarone ?Maintain Mag > 2 ?Needs to be evaluated in the OP setting for vasodilator therapy for PAH  ?Low Threshold for initiating antibiotics ( Leukocytosis but afebrile) ?Start Solu Medrol 80 mg BID x 4 doses then titrate down , prednisone taper once able to take po ?Try off BiPAP for short intervals ( High Flow [place]) , but BiPap at HS until more stable ?Titrate oxygen for saturations of > 90% ?Change Breo and Incruse  to Scheduled Nebs Garlon Hatchet, Yupelri and Pulmicort) ?Please consider Palliation as patient has been re-admitted multiple times over the last month.   ? ?Current Some day smoker  ?Pulmonary Nodules ?? Breast mass ?Plan ?Smoking cessation Counseling  ?Will need a CT Chest in 6 months to re-evaluate pulmonary nodules ? ?Breast Nodule  ?Plan  ?Needs follow up Mammogram as OP ? ? ? ?Best Practice (right click and "Reselect all SmartList Selections" daily)  ? ?Per [facility] Team  ?Labs   ?CBC: ?Recent Labs  ?Lab 02/26/22 ?2145 02/27/22 ?4259 02/27/22 ?5638  ?WBC 13.4*  --  14.4*  ?NEUTROABS 12.1*  --   --   ?HGB 14.2 13.6 13.4  ?HCT 43.1 40.0 40.0  ?MCV 103.4*  --  100.8*  ?PLT 145*  --  117*  ? ? ?Basic Metabolic Panel: ?Recent Labs  ?Lab 02/21/22 ?7564 02/26/22 ?2300 02/27/22 ?3329 02/27/22 ?5188  ?NA 135 136 137 136  ?K 4.9 4.4 4.3 4.4  ?CL 105 106  --  107  ?CO2 24 21*  --  19*  ?GLUCOSE 261* 273*  --  237*  ?BUN 32* 21  --  22  ?CREATININE 1.20* 1.10*  --  1.01*  ?CALCIUM 8.6* 8.1*  --  8.0*  ? ?GFR: ?Estimated Creatinine Clearance: 46.4 mL/min (A) (by C-G formula based on SCr of 1.01 mg/dL (H)). ?Recent Labs  ?Lab 02/26/22 ?2145 02/27/22 ?4166 02/27/22 ?0630  ?PROCALCITON  --  <0.10  --   ?WBC 13.4*  --  14.4*  ? ? ?Liver Function Tests: ?Recent Labs  ?Lab 02/26/22 ?2300  ?AST 35  ?ALT 25  ?ALKPHOS 58  ?BILITOT 1.1  ?PROT 5.6*  ?ALBUMIN 2.7*  ? ?No results for  input(s): LIPASE, AMYLASE in the last 168 hours. ?No results for input(s): AMMONIA in the last 168 hours. ? ?ABG ?   ?Component Value Date/Time  ? PHART 7.425 02/27/2022 0047  ? PCO2ART 34.0 02/27/2022 0047  ? PO2ART 78 (L) 02/27/2022 0047  ? HCO3 22.3 02/27/2022 0047  ? TCO2 23 02/27/2022 0047  ? ACIDBASEDEF 1.0 02/27/2022 0047  ? O2SAT 96 02/27/2022 0047  ?  ? ?Coagulation Profile: ?No results for input(s): INR, PROTIME in the last 168 hours. ? ?Cardiac Enzymes: ?No results for input(s): CKTOTAL, CKMB, CKMBINDEX, TROPONINI in the last 168 hours. ? ?HbA1C: ?Hgb A1c MFr Bld  ?Date/Time Value Ref  Range Status  ?02/27/2022 12:55 AM 7.8 (H) 4.8 - 5.6 % Final  ?  Comment:  ?  (NOTE) ?Pre diabetes:          5.7%-6.4% ? ?Diabetes:              >6.4% ? ?Glycemic control for   <7.0% ?adults with diabetes ?  ?11/05/2021 01:22 AM 7.6 (H) 4.8 - 5.6 % Final  ?  Comment:  ?  (NOTE) ?        Prediabetes: 5.7 - 6.4 ?        Diabetes: >6.4 ?        Glycemic control for adults with diabetes: <7.0 ?  ? ? ?CBG: ?Recent Labs  ?Lab 02/21/22 ?1633 02/27/22 ?7517 02/27/22 ?0017 02/27/22 ?0758 02/27/22 ?1057  ?GLUCAP 260* 227* 141* 142* 121*  ? ? ?Review of Systems:   ?+ Shortness of breath at rest and with exertion ?Lower extremity edema  ?Falls, generalized weakness ? ? ?Past Medical History:  ?She,  has a past medical history of Acute on chronic diastolic (congestive) heart failure ([place]) (05/19/2021), Acute respiratory failure with hypoxia ([place]) (09/06/2020), Atrial fibrillation with RVR ([place]), Diabetes mellitus without complication ([place]), Hyperlipidemia, Hypertension, Hypothyroidism, IPF (idiopathic pulmonary fibrosis) ([place]), Pneumonia due to COVID-19 virus (01/16/2021), and Thrombocytopenia ([place]).  ? ?Surgical History:  ? ?Past Surgical History:  ?Procedure Laterality Date  ? RIGHT HEART CATH N/A 07/05/2021  ? Procedure: RIGHT HEART CATH;  Surgeon: [name], MD;  Location: [facility];  Service: Cardiovascular;  Laterality:  N/A;  ? RIGHT HEART CATH N/A 01/06/2022  ? Procedure: RIGHT HEART CATH;  Surgeon: Larey Dresser, MD;  Location: [facility];  Service: Cardiovascular;  Laterality: N/A;  ? RIGHT/LEFT HEART CATH AND COR

## 2022-02-27 NOTE — TOC Initial Note (Signed)
Transition of Care (TOC) - Initial/Assessment Note  ? ? ?Patient Details  ?Name: TYNLEIGH BIRT ?MRN: [id number] ?Date of Birth: June 18, 1941 ? ?Transition of Care [facility]) CM/SW Contact:    ?Ninfa Meeker, RN ?Phone Number: ?02/27/2022, 2:32 PM ? ?Clinical Narrative:   Case manager contacted patient's daughter, [name] [phone], as requested. [name] had questions about care for her mom at home. Concerned that she is getting weaker, has had a couple of falls and they want to know what else can be done on their end to help her. She states they are agreeable to having Home Hospice at this time. CM contacted Lorelee Market, LPN Liaison with [facility] and updated her, she will contact Social Worker to reach out top [employer]. CM messaged MD as well. Family still has to understand that Hospice is not going to provide 24/7 in home care for patient. They will have to private pay for that. TOC Team will continue to monitor.    ? ? ?Expected Discharge Plan: [place] ?  ? ? ?Patient Goals and CMS Choice ?  ?  ?  ? ?Expected Discharge Plan and Services ?Expected Discharge Plan: [place] ?Prior Living Arrangements/Services ?  ?  ?  ?       ?  ?  ?  ?  ? ?Activities of Daily Living ?  ?  ? ?Permission Sought/Granted ?  ?  ?   ?   ?   ?   ? ?Emotional Assessment ?  ?  ?  ?  ?  ?  ? ?Admission diagnosis:  Acute on chronic respiratory failure with hypoxia ([place]) [J96.21] ?Patient Active Problem List  ? Diagnosis Date Noted  ? Sacral decubitus ulcer 02/27/2022  ? Breast mass 02/12/2022  ? Atrial flutter with rapid ventricular response ([place]) 02/10/2022  ? PAF (paroxysmal atrial fibrillation) ([place]) 02/09/2022  ? Mood disorder ([place]) 02/09/2022  ? Chronic obstructive pulmonary disease with acute exacerbation ([place]) 01/26/2022  ? Acute renal failure superimposed on stage 3b chronic kidney disease ([place]) 01/26/2022  ? Thrombocytopenia ([place])   ? Elevated lactic  acid level   ? Respiratory distress   ? Hypoxia   ? Low back pain 01/11/2022  ? Acute respiratory failure ([place]) 12/29/2021  ? COPD with chronic bronchitis and emphysema ([place])   ? 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  12/28/2021  ? Hyperkalemia 12/28/2021  ? CKD (chronic kidney disease) 12/28/2021  ? Acute right-sided CHF (congestive heart failure) ([place]) 11/06/2021  ? Elevated troponin 11/03/2021  ? Constipation 11/03/2021  ? Pulmonary HTN ([place])   ? Acute on chronic systolic (congestive) heart failure ([place]) 05/19/2021  ? Acute on chronic respiratory failure with hypoxia ([place]) 05/19/2021  ? SOB (shortness of breath) 05/19/2021  ? AF (paroxysmal atrial fibrillation) ([place]) 05/19/2021  ? Acute on chronic congestive heart failure ([place])   ? Demand ischemia ([place])   ? Acute respiratory failure with hypoxia ([place]) 09/06/2020  ? 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[place]) 09/05/2020  ? ILD (interstitial lung disease) ([place]) 10/23/2019  ? Chronic respiratory failure with hypoxia ([place]) 10/23/2019  ? Essential (primary) hypertension 09/18/2018  ? PAD (peripheral artery disease) ([place]) 09/18/2018  ? Tobacco abuse  09/18/2018  ? Aortic regurgitation 09/18/2018  ? Hypothyroidism   ? Hyperlipidemia/PAD   ? Type 2 diabetes mellitus with hyperlipidemia ([place])   ? ?PCP:  Kathyrn Lass, MD ?Pharmacy:   ?[id number] [place], [place] - [address] AT [address]?Phone: [phone]: [phone]? ?[facility]?Phone: [phone]: [phone] ? ? ? ? ?Social Determinants of Health (SDOH) Interventions ?  ? ?Readmission Risk Interventions ?Readmission Risk Prevention Plan 02/15/2022 09/10/2020  ?Post Dischage Appt - Complete  ?Medication Screening - Complete  ?Transportation Screening Complete Complete  ?Medication Review (Press photographer Complete -   ?PCP or Specialist appointment within 3-5 days of discharge Complete -  ?[facility] Consult Complete -  ?SW Recovery Care/Counseling Consult Complete -  ?Palliative Care Screening Not Applicable -  ?SLibbyNot Applicable -  ?Some recent data might be hidden  ? ? ? ?

## 2022-02-27 NOTE — Progress Notes (Signed)
TOC acknowledges consult for patient. TOC awaiting PT/OT recommendations for patient. TOC will continue to follow and assist with patients dc planning needs. ?

## 2022-02-27 NOTE — Progress Notes (Signed)
RT removed pt from BiPAP, per MD verbal order, and placed pt on heated high flow nasal cannula. Pt tolerating well at this time with SVS. RT will continue to monitor pt. ?

## 2022-02-27 NOTE — H&P (Signed)
?History and Physical  ? ? ?Patient: Elizabeth Melton QMG:[id number] DOB: 11/09/1941 ?DOA: 02/26/2022 ?DOS: the patient was seen and examined on 02/27/2022 ?PCP: Kathyrn Lass, MD  ?Patient coming from: Home ? ?Chief Complaint:  ?Chief Complaint  ?Patient presents with  ? Shortness of Breath  ? ?HPI: Elizabeth Melton is a 81 y.o. female with medical history significant of A.Fib, PE, on eliquis, HTN, IPF with UIP, dCHF, PAH. ? ?Pt recently admitted to our service earlier this month for acute on chronic dCHF, COVID-19, and A.Fib RVR. ? ?Pt rhythm controlled with amiodarone. ? ?Pt discharged on 6L O2 on 3/16. ? ?EMS called out for respiratory distress today.  Pt with severe hypoxic resp failure, HTN with SBPs in the 200s per EMS.  Given NTG x3 and put on CPAP with marked improvement. ? ?In ED:  ?Pt improved with SBPs now in the 140s, still BIPAP dependent however (desats when they tried to put her back on her home 6L). ? ?Given 51m IV lasix for suspected decompensated CHF as cause of presentation. ?  ?Review of Systems: As mentioned in the history of present illness. All other systems reviewed and are negative. ?Past Medical History:  ?Diagnosis Date  ? Acute on chronic diastolic (congestive) heart failure ([place] 05/19/2021  ? Acute respiratory failure with hypoxia ([place] 09/06/2020  ? Atrial fibrillation with RVR (H[place]out[place]   ? Diabetes mellitus without complication (HLake Villa   ? Hyperlipidemia   ? Hypertension   ? Hypothyroidism   ? IPF (idiopathic pulmonary fibrosis) ([place]Frank[place]   ? Pneumonia due to COVID-19 virus 01/16/2021  ? Thrombocytopenia ([place]kes[place]   ? ?Past Surgical History:  ?Procedure Laterality Date  ? RIGHT HEART CATH N/A 07/05/2021  ? Procedure: RIGHT HEART CATH;  Surgeon: HLeonie Man MD;  [facility];  Service: Cardiovascular;  Laterality: N/A;  ? RIGHT HEART CATH N/A 01/06/2022  ? Procedure: RIGHT HEART CATH;  Surgeon: MLarey Dresser MD;  [facility];  Service: Cardiovascular;   Laterality: N/A;  ? RIGHT/LEFT HEART CATH AND CORONARY ANGIOGRAPHY N/A 11/23/2021  ? Procedure: RIGHT/LEFT HEART CATH AND CORONARY ANGIOGRAPHY;  Surgeon: MLarey Dresser MD;  [facility];  Service: Cardiovascular;  Laterality: N/A;  ? TUBAL LIGATION    ? ?Social History:  reports that she has been smoking cigarettes. She started smoking about 63 years ago. She has a 60.00 pack-year smoking history. She quit smokeless tobacco use about 59 years ago. She reports current alcohol use. She reports that she does not use drugs. ? ?Allergies  ?Allergen Reactions  ? Lisinopril Other (See Comments)  ?  Other reaction(s): cough 09/20/17  ? ? ?Family History  ?Problem Relation Age of Onset  ? Diabetes Mother   ? Kidney disease Mother   ? Hypertension Mother   ? Other Father   ?     tuberculosis  ? Hypertension Sister   ? Hyperlipidemia Sister   ? ? ?Prior to Admission medications   ?Medication Sig Start Date End Date Taking? Authorizing Provider  ?acetaminophen (TYLENOL) 500 MG tablet Take 1,000 mg by mouth every 8 (eight) hours as needed for headache or mild pain (pain).    [provider]  ?albuterol (VENTOLIN HFA) 108 (90 Base) MCG/ACT inhaler Inhale 2 puffs into the lungs every 6 (six) hours as needed for wheezing or shortness of breath. 02/08/22   RBrand Males MD  ?amiodarone (PACERONE) 200 MG tablet Take 1 tablet by mouth twice daily  until 02/26/22, then on 02/27/22 start taking 1 tablet daily. 02/21/22   Arrien, Jimmy Picket, MD  ?apixaban (ELIQUIS) 5 MG TABS tablet Take 1 tablet (5 mg total) by mouth 2 (two) times daily. 11/24/21   Larey Dresser, MD  ?b complex vitamins tablet Take 1 tablet by mouth daily.    [provider]  ?bisacodyl (DULCOLAX) 10 MG suppository Place 10 mg rectally daily as needed for moderate constipation.    [provider]  ?Blood Glucose Monitoring Suppl (TRUE METRIX METER) w/Device KIT 1 each by Other route in the morning and at bedtime.  12/25/20   [provider]  ?Budeson-Glycopyrrol-Formoterol (BREZTRI AEROSPHERE) 160-9-4.8 MCG/ACT AERO Inhale 2 puffs into the lungs in the morning and at bedtime. 01/25/22   Cobb, Karie Schwalbe, NP  ?diphenhydrAMINE (BENADRYL) 25 MG tablet Take 25 mg by mouth every 6 (six) hours as needed for allergies.    [provider]  ?DULoxetine (CYMBALTA) 30 MG capsule Take 1 capsule (30 mg total) by mouth daily. 01/14/22   Domenic Polite, MD  ?furosemide (LASIX) 20 MG tablet Take 1 tablet (20 mg total) by mouth daily. 02/22/22 03/24/22  Arrien, Jimmy Picket, MD  ?glucose blood test strip 1 each by Other route as needed for other (blood sugar).    [provider]  ?guaiFENesin (MUCINEX) 600 MG 12 hr tablet Take 1 tablet (600 mg total) by mouth 2 (two) times daily. ?Patient taking differently: Take 600 mg by mouth 2 (two) times daily. As needed 01/13/22   Domenic Polite, MD  ?insulin glargine (LANTUS) 100 UNIT/ML injection Inject 10 Units into the skin daily.    [provider]  ?Insulin Pen Needle (PEN NEEDLES 29GX1/2") 29G X 12MM MISC For insulin injection 05/22/21   [name], MD  ?albuterol (PROVENTIL) (2.5 MG/3ML) 0.083% nebulizer solution Take 3 mLs by nebulization every 6 (six) hours as needed. 02/21/22 03/23/22  Arrien, Jimmy Picket, MD  ?levothyroxine (SYNTHROID, LEVOTHROID) 75 MCG tablet Take 75 mcg by mouth daily before breakfast.     [provider]  ?lidocaine (LIDODERM) 5 % Place 1 patch onto the skin at bedtime. Remove & Discard patch within 12 hours or as directed by MD 01/13/22   Domenic Polite, MD  ?lovastatin (MEVACOR) 40 MG tablet Take 40 mg by mouth at bedtime.    [provider]  ?methocarbamol 1000 MG TABS Take 1,000 mg by mouth every 8 (eight) hours as needed for muscle spasms. 01/13/22   Domenic Polite, MD  ?nicotine (NICODERM CQ - DOSED IN MG/24 HOURS) 14 mg/24hr patch Place 14 mg onto the skin daily.    [provider]  ?nystatin cream  (MYCOSTATIN) Apply topically 2 (two) times daily. 02/21/22   Arrien, Jimmy Picket, MD  ?pantoprazole (PROTONIX) 40 MG tablet Take 1 tablet (40 mg total) by mouth daily. 01/14/22   Domenic Polite, MD  ?senna-docusate (SENOKOT-S) 8.6-50 MG tablet Take 2 tablets by mouth at bedtime. ?Patient taking differently: Take 2 tablets by mouth at bedtime. As needed 01/13/22   Domenic Polite, MD  ?SMART SENSE THIN LANCETS 26G MISC 1 each by Does not apply route 2 (two) times daily.    [provider]  ?traMADol (ULTRAM) 50 MG tablet Take 1 tablet (50 mg total) by mouth every 6 (six) hours as needed for moderate pain. 01/13/22   Domenic Polite, MD  ? ? ?Physical Exam: ?Vitals:  ? 02/26/22 2153 02/26/22 2156 02/26/22 2200 02/26/22 2245  ?BP: (!) 139/49  (!) 139/50 Marland Kitchen)  140/51  ?Pulse: 81  61 (!) 57  ?Resp: _0 ?Temp: 98.4 ?F (36.9 ?C)     ?TempSrc: Oral     ?SpO2: 100%  100% 92%  ?Weight:  (!) 159 kg    ?Height:  _1  (1.702 m)    ? ?Constitutional: NAD, calm, comfortable, on BIPAP ?Eyes: PERRL, lids and conjunctivae normal ?ENMT: Mucous membranes are moist. Posterior pharynx clear of any exudate or lesions.Normal dentition.  ?Neck: normal, supple, no masses, no thyromegaly ?Respiratory: Diffuse crackles bilaterally, no accessory muscle use, on BIPAP ?Cardiovascular: Regular rate and rhythm, no murmurs / rubs / gallops. 2-3+ BLE edema. 2+ pedal pulses. No carotid bruits.  ?Abdomen: no tenderness, no masses palpated. No hepatosplenomegaly. Bowel sounds positive.  ?Musculoskeletal: no clubbing / cyanosis. No joint deformity upper and lower extremities. Good ROM, no contractures. Normal muscle tone.  ?Skin: Sacral decubitus, looks stage 1 to me. ?Neurologic: CN 2-12 grossly intact. Sensation intact, DTR normal. Strength 5/5 in all 4.  ?Psychiatric: Normal judgment and insight. Alert and oriented x 3. Normal mood.  ? ?Data Reviewed: ? ?BNP = 592 ? ?Trop 32 ? ?CBC ?   ?Component Value Date/Time  ? WBC 13.4 (H) 02/26/2022  2145  ? RBC 4.17 02/26/2022 2145  ? HGB 14.2 02/26/2022 2145  ? HGB 15.6 07/01/2021 1205  ? HGB 15.8 10/09/2008 1107  ? HCT 43.1 02/26/2022 2145  ? HCT 47.2 (H) 07/01/2021 1205  ? HCT 45.9 10/09/2008 1107  ? PLT 145

## 2022-02-27 NOTE — Progress Notes (Addendum)
Pt transported by RN and RT from ED34 to 5K53 w/ no complications ?

## 2022-02-27 NOTE — Hospital Course (Addendum)
Elizabeth Melton is a 81 y.o. female with medical history significant of atrial fibrillation, pulmonary embolism, hypertension, diastolic congestive heart failure, pulmonary hypertension, history of idiopathic pulmonary fibrosis with UIP who was recently admitted to earlier this month presented to the hospital for acute respiratory distress.  She was discharged home on 6 L of oxygen on 02/23/2022.  This time, patient presented with severe hypoxic respiratory failure with elevated blood pressure.  Patient received nitroglycerin and CPAP with mild improvement.  In the ED patient remained BiPAP dependent and was given 40 mg of Lasix and was admitted hospital for further evaluation and treatment.  During hospitalization, patient was also seen by pulmonary and patient was initiated on IV steroids and required high flow nasal cannula at 25 L/min.  Palliative care also saw the patient during hospitalization.  At this time plan is transfer to residential hospice. ?

## 2022-02-27 NOTE — Progress Notes (Signed)
RT at bedside to find pt on BiPAP tolerating well with SVS. ?

## 2022-02-27 NOTE — Progress Notes (Addendum)
Same day note ? ?Patient seen and examined at bedside. ? ?Patient was admitted to the hospital for shortness of breath. ? ?At the time of my evaluation, patient complains of shortness of breath cough.  Patient still on BiPAP at the time of my evaluation.  Complains of phlegm production. ? ?Physical examination reveals elderly female on BiPAP.  Decreased breath sounds bilaterally with coarse breath sounds, peripheral edema noted. ? ?Laboratory data and imaging was reviewed ?  ?Assessment and Plan: ?* Acute on chronic respiratory failure with hypoxia (HCC) ?Recurrent admission at this time.  Diagnosis likely acute exacerbation of pulmonary fibrosis versus congestive heart failure.  Received BiPAP.  CT chest on 03/01/2022 showed emphysema and fibrosis with bilateral trace effusion and pulmonary nodular densities.  BNP elevated at 500.  Eliquis at home for PE.  Medicated with pulmonary team for consultation due to recurrent admission, need for BiPAP ? ?Acute right-sided CHF (congestive heart failure) ([place]) ?Possibility of right-sided heart failure with elevated BNP and underlying pulmonary fibrosis.  Strict intake and output charting, daily weights.  Check BMP in AM.  Continue Lasix 40 twice daily for now. ? ? ?PAF (paroxysmal atrial fibrillation) ([place]) ?On amiodarone and Eliquis at home.  Resume when p.o. able. ? ?ILD (interstitial lung disease) ([place]) ?Does have history of IPF with UIP on CT scan recently.  Also severe PAH.  Spoke with the pulmonary team.  We will defer the need for steroids at this time to pulmonary ? ? ?Sacral decubitus ulcer ?Continue wound care. ? ?Breast mass ?Needs follow up mammogram as outpt for 47m mass ? ?Tobacco abuse ?Counseling was done.  Continue nicotine patch. ? ?Essential (primary) hypertension ?On Lasix at home.  On IV Lasix at this time.  We will continue to monitor blood pressure. ? ?Type 2 diabetes mellitus with hyperlipidemia (HPitkas Point ?Continue sliding scale insulin, Lantus dose has  been decreased to have on this admission.  We will continue to monitor closely. ? ? ?I also spoke with the patient's daughter on the phone and updated her about the clinical condition of the patient.  Patient's daughter is motivated for hospice services on discharge.  She states that her mom has been suffering quite a bit in would like her to be at home so that the family can visit her.  She  understands her poor prognosis ? ?No Charge ? ?Signed, ? ?LDelila Pereyra MD ?[facility] ? ?

## 2022-02-28 ENCOUNTER — Ambulatory Visit: Payer: Medicare HMO | Admitting: Internal Medicine

## 2022-02-28 ENCOUNTER — Inpatient Hospital Stay (HOSPITAL_COMMUNITY): Payer: Medicare HMO

## 2022-02-28 DIAGNOSIS — Z7189 Other specified counseling: Secondary | ICD-10-CM

## 2022-02-28 DIAGNOSIS — I50811 Acute right heart failure: Secondary | ICD-10-CM | POA: Diagnosis not present

## 2022-02-28 DIAGNOSIS — N63 Unspecified lump in unspecified breast: Secondary | ICD-10-CM | POA: Diagnosis not present

## 2022-02-28 DIAGNOSIS — J9621 Acute and chronic respiratory failure with hypoxia: Secondary | ICD-10-CM | POA: Diagnosis not present

## 2022-02-28 DIAGNOSIS — I10 Essential (primary) hypertension: Secondary | ICD-10-CM | POA: Diagnosis not present

## 2022-02-28 LAB — GLUCOSE, CAPILLARY
Glucose-Capillary: 135 mg/dL — ABNORMAL HIGH (ref 70–99)
Glucose-Capillary: 138 mg/dL — ABNORMAL HIGH (ref 70–99)
Glucose-Capillary: 227 mg/dL — ABNORMAL HIGH (ref 70–99)
Glucose-Capillary: 168 mg/dL — ABNORMAL HIGH (ref 70–99)
Glucose-Capillary: 143 mg/dL — ABNORMAL HIGH (ref 70–99)

## 2022-02-28 LAB — BASIC METABOLIC PANEL
Anion gap: 6 (ref 5–15)
BUN: 19 mg/dL (ref 8–23)
CO2: 25 mmol/L (ref 22–32)
Calcium: 8 mg/dL — ABNORMAL LOW (ref 8.9–10.3)
Chloride: 107 mmol/L (ref 98–111)
Creatinine, Ser: 1.08 mg/dL — ABNORMAL HIGH (ref 0.44–1.00)
GFR, Estimated: 52 mL/min — ABNORMAL LOW (ref >60)
Glucose, Bld: 173 mg/dL — ABNORMAL HIGH (ref 70–99)
Potassium: 3.9 mmol/L (ref 3.5–5.1)
Sodium: 138 mmol/L (ref 135–145)

## 2022-02-28 LAB — ANTI-SCLERODERMA ANTIBODY: Scleroderma (Scl-70) (ENA) Antibody, IgG: <0.2 AI (ref 0.0–0.9)

## 2022-02-28 LAB — RHEUMATOID FACTOR: Rheumatoid fact SerPl-aCnc: 38.3 IU/mL — ABNORMAL HIGH (ref <14.0)

## 2022-02-28 LAB — ANA W/REFLEX IF POSITIVE: Anti Nuclear Antibody (ANA): NEGATIVE

## 2022-02-28 LAB — ALDOLASE: Aldolase: 9.4 U/L (ref 3.3–10.3)

## 2022-02-28 MED ORDER — POTASSIUM CHLORIDE CRYS ER 20 MEQ PO TBCR
40.0000 meq | EXTENDED_RELEASE_TABLET | Freq: Once | ORAL | Status: AC
  Administered 2022-02-28: 40 meq via ORAL
  Filled 2022-02-28: qty 2

## 2022-02-28 NOTE — Plan of Care (Signed)

## 2022-02-28 NOTE — Progress Notes (Signed)
02/28/2022 ? ? ?I have seen and evaluated the patient for advancing fibrotic lung disease. ? ?S:  ?No events, feels mildly better.  On 25L 100% saturating mid 90s. ? ?O: ?Blood pressure (!) 134/56, pulse (!) 55, temperature 98.3 ?F (36.8 ?C), resp. rate (!) 22, height _0  (1.676 m), weight 73 kg, SpO2 97 %.  ?No distress ?Nonlabored breathing pattern ?Crackles at bases ?Ext with trace edema ?Moves all 4 ext to command ? 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  Family would like her to come home with hospice if able. ? ?P:  ?- Switch diuretics to oral ?- Start PRN oral morphine ?- Discussed with RT: see if we can wean to 10LPM or less of supplemental O2, use 85% goal.  Think much of her hypoxemia is shunt so this may be achievable today. ?- Will reach out to primary to determine if they or we should have code status discussion: she should not go on home hospice without being DNR ?- Will let Drs. Aundra Dubin and [place] know of change in goals of care ?- Will be available PRN ? ?Erskine Emery MD ?[facility] ?Prefer epic messenger for cross cover needs ?If after hours, please call E-link ? ?

## 2022-02-28 NOTE — Progress Notes (Signed)
?PROGRESS NOTE ? ? ? ?Elizabeth Melton  SJG:[id number] DOB: November 13, 1941 DOA: 02/26/2022 ?PCP: Kathyrn Lass, MD  ? ? ?Brief Narrative:  ?Elizabeth Melton is a 81 y.o. female with medical history significant of atrial fibrillation, pulmonary embolism, hypertension, diastolic congestive heart failure, pulmonary hypertension, history of idiopathic pulmonary fibrosis with UIP who was recently admitted to earlier this month presented to the hospital for acute respiratory distress.  She was discharged home on 6 L of oxygen on 02/23/2022.  This time patient presented with severe hypoxic respiratory failure with elevated blood pressure.  Patient received nitroglycerin and CPAP with improvement.  In the ED patient remained BiPAP dependent and was given 40 mg of Lasix and was admitted hospital for further evaluation and treatment.    ? ?  ?Assessment and Plan: ?* Acute on chronic respiratory failure with hypoxia (HCC) ?Recurrent admission.  Diagnosis likely acute exacerbation of pulmonary fibrosis versus congestive heart failure.  Received BiPAP.  CT chest on 03/01/2022 showed emphysema and fibrosis with bilateral trace effusion and pulmonary nodular densities.  BNP elevated at 500.  Eliquis at home for PE.  Seen by pulmonary and currently on IV steroids.  Autoimmune work-up was sent by pulmonary as well.  Currently on high flow nasal cannula.  Patient was initially on BiPAP. ? ?Acute right-sided CHF (congestive heart failure) ([place]) ?Possibility of right-sided heart failure with elevated BNP and underlying pulmonary fibrosis.  Strict intake and output charting, daily weights.  Check BMP in AM.  Continue Lasix 40 milligram twice daily for now.  Patient is negative balance for 3010 mL. ? ? ?PAF (paroxysmal atrial fibrillation) ([place]) ?On amiodarone and Eliquis at home.  Has been resumed. ? ?ILD (interstitial lung disease) ([place]) ?Does have history of IPF with UIP on CT scan recently.  Also severe PAH.  Seen by pulmonary.  On IV  steroids.  Autoimmune work-up has been sent ? ?Sacral decubitus ulcer ?Continue wound care. ? ?Breast mass ?Needs follow up mammogram as outpt for 64m mass ? ?Tobacco abuse ?Counseling was done.  Continue nicotine patch. ? ?Essential (primary) hypertension ?On Lasix at home.  On IV Lasix at this time.  We will continue to monitor blood pressure. ? ?Type 2 diabetes mellitus with hyperlipidemia ([place] ?Continue sliding scale insulin, Lantus dose has been decreased to have on this admission.  We will continue to monitor closely. ? ?Goals of care, counseling/discussion ?Had a prolonged discussion with the patient's family history.  Patient's family wishes palliative care/hospice level of care on discharge.  Patient wishes to be at home with family and be comfortable.  TOC has been consulted for home hospice evaluation. ? ? ? DVT prophylaxis:  ?apixaban (ELIQUIS) tablet 5 mg  ? ?Code Status:   ?  Code Status: Full Code ? ?Disposition: Home with home hospice pending arrangements ? ?Status is: Inpatient ?Remains inpatient appropriate because: Respiratory failure on high flow nasal cannula ? ? Family Communication: Spoke with the patient's family on 02/27/2022 ? ?Consultants:  ?Pulmonary ? ? ?Procedures:  ?BiPAP placement ? ?Antimicrobials:  ?None ? ?Anti-infectives (From admission, onward)  ? ? None  ? ?  ? ? ?Subjective: ?Today, patient was seen and examined at bedside.  Patient still complains of shortness of breath and dyspnea but denies any pain nausea vomiting fever or chills.  On high flow nasal cannula.  Has cough. ? ?Objective: ?Vitals:  ? 02/28/22 0705 02/28/22 0838 02/28/22 1113 02/28/22 1200  ?BP: (!) 150/54  114/64 (!) 135/46  ?Pulse: (Marland Kitchen  58 (!) 59 66 63  ?Resp: _0 (!) 23  ?Temp: 97.8 ?F (36.6 ?C)  97.9 ?F (36.6 ?C) 98.3 ?F (36.8 ?C)  ?TempSrc: Oral  Oral   ?SpO2: 94% 94% (!) 89% (!) 86%  ?Weight:      ?Height:      ? ? ?Intake/Output Summary (Last 24 hours) at 02/28/2022 1245 ?Last data filed at 02/28/2022  1113 ?Gross per 24 hour  ?Intake 240 ml  ?Output 1700 ml  ?Net -1460 ml  ? ?Filed Weights  ? 02/26/22 2156 02/27/22 0219 02/28/22 0435  ?Weight: (!) 159 kg 73.8 kg 73 kg  ? ? ?Physical Examination: ? ?General:  Average built, not in obvious distress, on high flow nasal cannula, elderly female deconditioned and weak. ?HENT:   No scleral pallor or icterus noted. Oral mucosa is moist.  ?Chest:  Diminished breath sounds bilaterally. ?CVS: S1 &S2 heard. No murmur.  Regular rate and rhythm. ?Abdomen: Soft, nontender, nondistended.  Bowel sounds are heard.   ?Extremities: No cyanosis, clubbing or edema.  Peripheral pulses are palpable. ?Psych: Alert, awake and oriented, communicated. ?CNS:  No cranial nerve deficits.  Power equal in all extremities.   ?Skin: Warm and dry.  No rashes noted. ? ? ?Data Reviewed:  ? ?CBC: ?Recent Labs  ?Lab 02/26/22 ?2145 02/27/22 ?3192 02/27/22 ?4383  ?WBC 13.4*  --  14.4*  ?NEUTROABS 12.1*  --   --   ?HGB 14.2 13.6 13.4  ?HCT 43.1 40.0 40.0  ?MCV 103.4*  --  100.8*  ?PLT 145*  --  117*  ? ? ?Basic Metabolic Panel: ?Recent Labs  ?Lab 02/26/22 ?2300 02/27/22 ?6542 02/27/22 ?7156 02/28/22 ?0310  ?NA 136 137 136 138  ?K 4.4 4.3 4.4 3.9  ?CL 106  --  107 107  ?CO2 21*  --  19* 25  ?GLUCOSE 273*  --  237* 173*  ?BUN 21  --  22 19  ?CREATININE 1.10*  --  1.01* 1.08*  ?CALCIUM 8.1*  --  8.0* 8.0*  ? ? ?Liver Function Tests: ?Recent Labs  ?Lab 02/26/22 ?2300  ?AST 35  ?ALT 25  ?ALKPHOS 58  ?BILITOT 1.1  ?PROT 5.6*  ?ALBUMIN 2.7*  ? ? ? ?Radiology Studies: ?CT CHEST WO CONTRAST ? ?Result Date: 02/27/2022 ?CLINICAL DATA:  Hypoxia: CHF vs AEIPF EXAM: CT CHEST WITHOUT CONTRAST TECHNIQUE: Multidetector CT imaging of the chest was performed following the standard protocol without IV contrast. RADIATION DOSE REDUCTION: This exam was performed according to the departmental dose-optimization program which includes automated exposure control, adjustment of the mA and/or kV according to patient size and/or use  of iterative reconstruction technique. COMPARISON:  CT chest 02/11/2022, CT chest 08/28/2018, CT angiography chest 09/05/2020, CT chest 07/13/2020 FINDINGS: Cardiovascular: Enlarged heart size. No significant pericardial effusion. The thoracic aorta is normal in caliber. Severe atherosclerotic plaque of the thoracic aorta. Three-vessel coronary artery calcifications. Enlarged main pulmonary artery measuring up to 3.6 cm. Mediastinum/Nodes: No gross hilar adenopathy, noting limited sensitivity for the detection of hilar adenopathy on this noncontrast study. Prominent mediastinal lymph nodes and stable enlarged precarinal lymph node measuring up to 2.7 cm (3:52). No enlarged axillary lymph nodes. Thyroid gland, trachea, and esophagus demonstrate no significant findings. Lungs/Pleura: Severe paraseptal and centrilobular emphysematous changes. Similar-appearing moderate diffuse peripheral reticulations, traction bronchiectasis, architectural distortion. No focal consolidation. Redemonstration of a 7 x 6 mm right upper lobe pulmonary nodule (4:73). Similar-appearing slightly more superiorly couple of 6 x 6 mm right upper lobe pulmonary  nodule (48, 4:52). Redemonstration of a nodular-like subpleural density measuring 21 x 13 mm within the inferior portion of the right upper lobe interval increase in patchy consolidative airspace opacities within the peripheral lower lobes and lower segments of the upper lobes (4:72). Subpleural nodule like density within the superior aspect of right lower lobe measuring 18 x 7 mm (4:61). No pulmonary mass. No pleural effusion. No pneumothorax. Upper Abdomen: Cholelithiasis.  No acute abnormality. Musculoskeletal: Redemonstration of a 13 x 8 mm right breast soft tissue density. No suspicious lytic or blastic osseous lesions stable chronic T12 compression fracture. Multilevel degenerative changes of the spine. IMPRESSION: 1. Emphysema and fibrosis with likely developing superimposed  infection/inflammation. 2. Bilateral trace pleural effusions. 3. Stable right upper lobe pulmonary nodules measuring up to 7 mm. Persistent nodular-like subpleural densities within the inferior aspect of the righ

## 2022-02-28 NOTE — Progress Notes (Signed)
Changed H2O bottle on HHFNC. Placed spare in room. ?

## 2022-02-28 NOTE — Progress Notes (Signed)
Full note to follow ?Continue diuresis and steroids ?Check echo/bubble ?Will take a while to get better (if she is able to), very frail baseline cardiopulmonary status. ?

## 2022-02-28 NOTE — Discharge Instructions (Signed)

## 2022-02-28 NOTE — Assessment & Plan Note (Addendum)
DNR/DNI.  Patient has been seen by palliative care.  Poor prognosis.  Will be transitioned to residential hospice. ?

## 2022-03-01 ENCOUNTER — Ambulatory Visit (HOSPITAL_COMMUNITY): Payer: Medicare HMO | Admitting: Nurse Practitioner

## 2022-03-01 ENCOUNTER — Inpatient Hospital Stay (HOSPITAL_COMMUNITY): Payer: Medicare HMO

## 2022-03-01 DIAGNOSIS — I50811 Acute right heart failure: Secondary | ICD-10-CM | POA: Diagnosis not present

## 2022-03-01 DIAGNOSIS — N63 Unspecified lump in unspecified breast: Secondary | ICD-10-CM | POA: Diagnosis not present

## 2022-03-01 DIAGNOSIS — Z66 Do not resuscitate: Secondary | ICD-10-CM

## 2022-03-01 DIAGNOSIS — Q211 Atrial septal defect, unspecified: Secondary | ICD-10-CM

## 2022-03-01 DIAGNOSIS — Z515 Encounter for palliative care: Secondary | ICD-10-CM

## 2022-03-01 DIAGNOSIS — J9621 Acute and chronic respiratory failure with hypoxia: Secondary | ICD-10-CM | POA: Diagnosis not present

## 2022-03-01 DIAGNOSIS — Z7189 Other specified counseling: Secondary | ICD-10-CM | POA: Diagnosis not present

## 2022-03-01 DIAGNOSIS — I10 Essential (primary) hypertension: Secondary | ICD-10-CM | POA: Diagnosis not present

## 2022-03-01 LAB — BASIC METABOLIC PANEL
Anion gap: 8 (ref 5–15)
BUN: 11 mg/dL (ref 8–23)
CO2: 25 mmol/L (ref 22–32)
Calcium: 8.3 mg/dL — ABNORMAL LOW (ref 8.9–10.3)
Chloride: 103 mmol/L (ref 98–111)
Creatinine, Ser: 0.79 mg/dL (ref 0.44–1.00)
GFR, Estimated: >60 mL/min (ref >60)
Glucose, Bld: 183 mg/dL — ABNORMAL HIGH (ref 70–99)
Potassium: 4.5 mmol/L (ref 3.5–5.1)
Sodium: 136 mmol/L (ref 135–145)

## 2022-03-01 LAB — ANCA PROFILE
Anti-MPO Antibodies: <0.2 units (ref 0.0–0.9)
Anti-PR3 Antibodies: <0.2 units (ref 0.0–0.9)
Atypical P-ANCA titer: <1:20 {titer}
C-ANCA: <1:20 {titer}
P-ANCA: <1:20 {titer}

## 2022-03-01 LAB — CBC
HCT: 42 % (ref 36.0–46.0)
Hemoglobin: 14.1 g/dL (ref 12.0–15.0)
MCH: 33.7 pg (ref 26.0–34.0)
MCHC: 33.6 g/dL (ref 30.0–36.0)
MCV: 100.5 fL — ABNORMAL HIGH (ref 80.0–100.0)
Platelets: 114 10*3/uL — ABNORMAL LOW (ref 150–400)
RBC: 4.18 MIL/uL (ref 3.87–5.11)
RDW: 16.9 % — ABNORMAL HIGH (ref 11.5–15.5)
WBC: 10.1 10*3/uL (ref 4.0–10.5)
nRBC: 0 % (ref 0.0–0.2)

## 2022-03-01 LAB — SJOGRENS SYNDROME-A EXTRACTABLE NUCLEAR ANTIBODY: SSA (Ro) (ENA) Antibody, IgG: <0.2 AI (ref 0.0–0.9)

## 2022-03-01 LAB — ECHOCARDIOGRAM LIMITED BUBBLE STUDY
AR max vel: 0.95 cm2
AV Area VTI: 0.89 cm2
AV Area mean vel: 0.86 cm2
AV Mean grad: 10 mmHg
AV Peak grad: 20.3 mmHg
Ao pk vel: 2.25 m/s
Area-P 1/2: 2.83 cm2
MV M vel: 5.77 m/s
MV Peak grad: 133.2 mmHg
P 1/2 time: 765 msec
Single Plane A4C EF: 71.7 %

## 2022-03-01 LAB — SJOGRENS SYNDROME-B EXTRACTABLE NUCLEAR ANTIBODY: SSB (La) (ENA) Antibody, IgG: <0.2 AI (ref 0.0–0.9)

## 2022-03-01 LAB — GLUCOSE, CAPILLARY
Glucose-Capillary: 125 mg/dL — ABNORMAL HIGH (ref 70–99)
Glucose-Capillary: 137 mg/dL — ABNORMAL HIGH (ref 70–99)
Glucose-Capillary: 139 mg/dL — ABNORMAL HIGH (ref 70–99)
Glucose-Capillary: 159 mg/dL — ABNORMAL HIGH (ref 70–99)
Glucose-Capillary: 182 mg/dL — ABNORMAL HIGH (ref 70–99)
Glucose-Capillary: 202 mg/dL — ABNORMAL HIGH (ref 70–99)

## 2022-03-01 LAB — ANTI-DNA ANTIBODY, DOUBLE-STRANDED: ds DNA Ab: 1 IU/mL (ref 0–9)

## 2022-03-01 LAB — CYCLIC CITRUL PEPTIDE ANTIBODY, IGG/IGA: CCP Antibodies IgG/IgA: 3 units (ref 0–19)

## 2022-03-01 LAB — MAGNESIUM: Magnesium: 2 mg/dL (ref 1.7–2.4)

## 2022-03-01 MED ORDER — MORPHINE SULFATE 15 MG PO TABS: 15.0000 mg | ORAL_TABLET | ORAL | Status: DC | PRN

## 2022-03-01 MED ORDER — FUROSEMIDE 40 MG PO TABS
40.0000 mg | ORAL_TABLET | Freq: Two times a day (BID) | ORAL | Status: DC
Start: 2022-03-01 — End: 2022-03-02
  Administered 2022-03-01 – 2022-03-02 (×2): 40 mg via ORAL
  Filled 2022-03-01 (×2): qty 1

## 2022-03-01 MED ORDER — MORPHINE SULFATE (PF) 2 MG/ML IV SOLN: 2.0000 mg | INTRAVENOUS | Status: DC | PRN

## 2022-03-01 NOTE — Progress Notes (Addendum)
[facility]) Hospital Liaison note: ? ?Received request from Villa Herb, PMT NP for patient interest in [facility] Chart reviewed and spoke with patient/daughter [name] to acknowledge referral. Unfortunately, [facility] is not able to offer a room today. Pt/daughter and Kendal Hymen aware, hospital liaison will follow up tomorrow if room becomes available. Hospice eligibility confirmed. Please do not hesitate to call with questions. Thank you. ? ?Lorelee Market, LPN ?[facility] Hospital Liaison  ?[phone] ?

## 2022-03-01 NOTE — Progress Notes (Signed)
? ?  Echocardiogram ?2D Echocardiogram has been performed. ? ?Elizabeth Melton ?03/01/2022, 9:01 AM ?

## 2022-03-01 NOTE — Telephone Encounter (Addendum)
She states at this time she would like to cancel this appointment and continue to just use oxygen. She was recently discharged from the hospital and she is in the process of trying to obtain a larger oxygen concentrator. She will let us know if she changes her mind and wises to proceed with the titration. ? ?Please let her know that she needs PAP along with her O2 or she will not be adequately treated  ? ?Patient notified of dr Landis Gandy recommendations on her MyChart  ?

## 2022-03-01 NOTE — Consult Note (Signed)
? ?  [facility] CM Inpatient Consult ? ? ?03/01/2022 ? ?Elizabeth Melton ?02-05-1941 ?[id number] ? ?[place] Organization [ACO] Patient: [name] ? ?Primary Care Provider:  Kathyrn Lass, MD is with Sadie Haber at [facility],  is an embedded provider with a Chronic Care Management team and program, and is listed for the transition of care follow up and appointments. ? ?Patient continues to be active with [facility]. Progress notes reviewed from inpatient [facility] RNCM and reviewed orders for palliative consult.. ? ?Plan: Continue to follow for appropriate disposition and update [facility] RNCM of changes. ? ?Please contact for further questions, ? ?Natividad Brood, RN BSN CCM ?[facility] Liaison ? [phone] business mobile phone ?Toll free office [phone]  ?Fax number: [phone] ?[email or web address] ?  ? ?

## 2022-03-01 NOTE — Progress Notes (Signed)
?PROGRESS NOTE ? ? ? ?Elizabeth Melton  HQR:[id number] DOB: 1941/08/10 DOA: 02/26/2022 ?PCP: Kathyrn Lass, MD  ? ? ?Brief Narrative:  ?Elizabeth Melton is a 81 y.o. female with medical history significant of atrial fibrillation, pulmonary embolism, hypertension, diastolic congestive heart failure, pulmonary hypertension, history of idiopathic pulmonary fibrosis with UIP who was recently admitted to earlier this month presented to the hospital for acute respiratory distress.  She was discharged home on 6 L of oxygen on 02/23/2022.  This time, patient presented with severe hypoxic respiratory failure with elevated blood pressure.  Patient received nitroglycerin and CPAP with mild improvement.  In the ED patient remained BiPAP dependent and was given 40 mg of Lasix and was admitted hospital for further evaluation and treatment.  During hospitalization patient was also seen by pulmonary and patient has been initiated on IV steroids.  ? ? Assessment and Plan: ?* Acute on chronic respiratory failure with hypoxia (HCC) ?Recurrent admission.  Diagnosis likely acute exacerbation of pulmonary fibrosis versus congestive heart failure.  Received BiPAP during hospitalization..  CT chest on 03/01/2022 showed emphysema and fibrosis with bilateral trace effusion and pulmonary nodular densities.  BNP elevated at 500.  Patient was then eliquis at home for PE.  Seen by pulmonary during hospitalization and currently on IV steroids.  Autoimmune work-up was sent. Currently on high flow nasal cannula.  Patient is a poor baseline cardiopulmonary functioning and is status.  Likely to be a prolonged course in the hospital if she were to improve.  Patient's family wishing for hospice care at home. ? ?Acute right-sided CHF (congestive heart failure) ([place]) ?Possibility of right-sided heart failure with elevated BNP and underlying pulmonary fibrosis.  Strict intake and output charting, daily weights.    Continue Lasix 40 milligram twice daily  for now.  Patient is negative balance for 5240 mL. ? ? ?PAF (paroxysmal atrial fibrillation) ([place]) ?On amiodarone and Eliquis.  Rate controlled ? ?ILD (interstitial lung disease) ([place]) ?Does have history of IPF with UIP on CT scan recently.  Also severe PAH.  Seen by pulmonary.  On IV steroids.  Autoimmune work-up has been sent.  We will follow pulmonary recommendation.  Likely to be a prolonged course in the hospital. ? ?Sacral decubitus ulcer ?Continue wound care. ? ?Breast mass ?Needs follow up mammogram as outpt for 48m mass ? ?Tobacco abuse ?Counseling was done.  Continue nicotine patch. ? ?Essential (primary) hypertension ?On Lasix at home.  On IV Lasix at this time.  We will continue to monitor blood pressure. ? ?Type 2 diabetes mellitus with hyperlipidemia ([place] ?Continue sliding scale insulin, Lantus dose has been decreased to have on this admission.  We will continue to monitor closely. ? ?Goals of care, counseling/discussion ?Had a prolonged discussion with the patient's family.  Patient's family wishes palliative care/hospice level of care on discharge.  Patient wishes to be at home with family and be comfortable.  TOC has been consulted for home hospice evaluation but patient continues to have significant oxygen requirement.. ? ? ? DVT prophylaxis:  ?apixaban (ELIQUIS) tablet 5 mg  ? ?Code Status:   ?  Code Status: Full Code.  ?I spoke with the patient's daughter on the phone at length.  Currently full code ? ?Disposition: Home with home hospice but on very high levels of oxygen., patient's daughter still wishing for full code. ? ?Status is: Inpatient ? ?Remains inpatient appropriate because: Respiratory failure on high flow nasal cannula ? ? Family Communication:  ?Spoke with the patient's  family on 03/01/2022 ? ?Consultants:  ?Pulmonary ? ?Procedures:  ?BiPAP placement ? ?Antimicrobials:  ?None ? ?Anti-infectives (From admission, onward)  ? ? None  ? ?  ? ?Subjective: ?Today, patient was seen and  examined at bedside.  Patient denies any pain in the chest mild back pain.  Patient denies any nausea vomiting fever or chills.  Feels little better with breathing but on high flow nasal cannula 25 L/min.   ? ?Objective: ?Vitals:  ? 03/01/22 0204 03/01/22 0431 03/01/22 0800 03/01/22 0813  ?BP:  (!) 132/49 (!) 131/48   ?Pulse:  (!) 58 (!) 59   ?Resp:  16 19   ?Temp:  98.3 ?F (36.8 ?C) 98.4 ?F (36.9 ?C)   ?TempSrc:  Oral Oral   ?SpO2: 90% 93% 93% 94%  ?Weight:  74.9 kg    ?Height:      ? ? ?Intake/Output Summary (Last 24 hours) at 03/01/2022 0950 ?Last data filed at 03/01/2022 0436 ?Gross per 24 hour  ?Intake 120 ml  ?Output 2800 ml  ?Net -2680 ml  ? ?Filed Weights  ? 02/27/22 0219 02/28/22 0435 03/01/22 0431  ?Weight: 73.8 kg 73 kg 74.9 kg  ? ? ?Physical Examination: ?General:  Average built, not in obvious distress, on high flow nasal cannula oxygen, elderly female, deconditioned and weak. ?HENT:   No scleral pallor or icterus noted. Oral mucosa is moist.  ?Chest:   Diminished breath sounds bilaterally.  ?CVS: S1 &S2 heard. No murmur.  Regular rate and rhythm. ?Abdomen: Soft, nontender, nondistended.  Bowel sounds are heard.   ?Extremities: No cyanosis, clubbing or edema.  Peripheral pulses are palpable. ?Psych: Alert, awake and oriented, communicative  ?CNS:  No cranial nerve deficits.  Power equal in all extremities.   ?Skin: Warm and dry.  No rashes noted. ? ? ?Data Reviewed:  ? ?CBC: ?Recent Labs  ?Lab 02/26/22 ?2145 02/27/22 ?4628 02/27/22 ?6381 03/01/22 ?0238  ?WBC 13.4*  --  14.4* 10.1  ?NEUTROABS 12.1*  --   --   --   ?HGB 14.2 13.6 13.4 14.1  ?HCT 43.1 40.0 40.0 42.0  ?MCV 103.4*  --  100.8* 100.5*  ?PLT 145*  --  117* 114*  ? ? ?Basic Metabolic Panel: ?Recent Labs  ?Lab 02/26/22 ?2300 02/27/22 ?7711 02/27/22 ?6579 02/28/22 ?0310 03/01/22 ?0238  ?NA 136 137 136 138 136  ?K 4.4 4.3 4.4 3.9 4.5  ?CL 106  --  107 107 103  ?CO2 21*  --  19* 25 25  ?GLUCOSE 273*  --  237* 173* 183*  ?BUN 21  --  _0 ?CREATININE 1.10*  --  1.01* 1.08* 0.79  ?CALCIUM 8.1*  --  8.0* 8.0* 8.3*  ?MG  --   --   --   --  2.0  ? ? ?Liver Function Tests: ?Recent Labs  ?Lab 02/26/22 ?2300  ?AST 35  ?ALT 25  ?ALKPHOS 58  ?BILITOT 1.1  ?PROT 5.6*  ?ALBUMIN 2.7*  ? ? ? ?Radiology Studies: ?DG CHEST PORT 1 VIEW ? ?Result Date: 02/28/2022 ?CLINICAL DATA:  Encounter for shortness of breath. EXAM: PORTABLE CHEST 1 VIEW COMPARISON:  Portable chest 02/26/2022. FINDINGS: 4:57 a.m., 02/28/2022. The lungs are emphysematous with coarse chronic basal-predominant subpleural fibrosis. Density of the fibrotic change limits evaluation for new infiltrates but no new increased opacity is seen. The heart is enlarged. The central vessels are normal caliber. No pleural effusion is seen. The aorta is tortuous with calcifications with stable mediastinal configuration. Degenerative  disc disease thoracic spine. IMPRESSION: Advanced chronic changes with COPD. No new infiltrate is suspected but assessment limited due to the density of the fibrosis. Stable cardiomegaly. Aortic atherosclerosis. Electronically Signed   By: Telford Nab M.D.   On: 02/28/2022 07:21   ? ? ? LOS: 2 days  ? ? ?Flora Lipps, MD ?[facility] ?03/01/2022, 9:50 AM  ? ? ?

## 2022-03-01 NOTE — Plan of Care (Signed)

## 2022-03-01 NOTE — TOC Progression Note (Signed)
Transition of Care (TOC) - Progression Note  ? ? ?Patient Details  ?Name: Elizabeth Melton ?MRN: [id number] ?Date of Birth: 1941/09/13 ? ?Transition of Care (TOC) CM/SW Contact  ?Ninfa Meeker, RN ?Phone Number: ?03/01/2022, 2:23 PM ? ?Clinical Narrative:   Case manager received call from Ramsey with [facility], she has spoken with patient and contacting daughter. Patient has decided to go to inpatient [place], and has requested to be DNR. There are no beds available at [facility] today.  TOC Team will continue to monitor.  ? ? ?Expected Discharge Plan: [place] ?  ? ?Expected Discharge Plan and Services ?Expected Discharge Plan: [place] ?Social Determinants of Health (SDOH) Interventions ?  ? ?Readmission Risk Interventions ? ?  02/15/2022  ?  3:22 PM 09/10/2020  ? 12:21 PM  ?Readmission Risk Prevention Plan  ?Post Dischage Appt  Complete  ?Medication Screening  Complete  ?Transportation Screening Complete Complete  ?Medication Review Press photographer) Complete   ?PCP or Specialist appointment within 3-5 days of discharge Complete   ?[place] or Home Care Consult Complete   ?SW Recovery Care/Counseling Consult Complete   ?Palliative Care Screening Not Applicable   ?[place] Not Applicable   ? ? ?

## 2022-03-01 NOTE — Telephone Encounter (Signed)
Patient notified of dr Landis Gandy recommendations on here MyChart .  ?

## 2022-03-01 NOTE — Consult Note (Signed)
?Consultation Note ?Date: 03/01/2022  ? ?Patient Name: REMONA BOOM  ?DOB: Mar 31, 1941  MRN: [id number]  Age / Sex: 81 y.o., female  ?PCP: Kathyrn Lass, MD ?Referring Physician: Flora Lipps, MD ? ?Reason for Consultation: Establishing goals of care ? ?HPI/Patient Profile: 81 y.o. female  with past medical history of A-fib, PE, hypertension, CHF, pulmonary hypertension, pulmonary fibrosis, and multiple hospitalizations admitted on 02/26/2022 with acute respiratory distress.  Patient has had 5 hospital admissions in the last 6 months.  Patient was just discharged home March 16 and then readmitted on the 19th.  This admission respiratory failure thought to be exacerbation of pulmonary fibrosis versus congestive heart failure.  Patient requiring high flow nasal cannula.  PMT consulted to discuss goals of care. ? ?Clinical Assessment and Goals of Care: ?I have reviewed medical records including EPIC notes, labs and imaging, assessed the patient and then met with with patient to discuss diagnosis prognosis, GOC, EOL wishes, disposition and options. ? ?Patient is quite familiar with our team and recalls previous meetings. ? ?I asked patient her understanding of her disease and she tells me "I know I am not going to get better". ? ? We discussed patient's current illness and what it means in the larger context of patient's on-going co-morbidities.  Natural disease trajectory and expectations at EOL were discussed.  We discussed her lung disease and dependence on high amounts of oxygen.  Patient appears shocked when I tell her she is currently on 20 L of oxygen. ? ?I attempted to elicit values and goals of care important to the patient.  Patient tells me she wants to be at home and enjoy time with her family.  I explained to her my concern that this cannot be accommodated as she is requiring too much oxygen-more than can be provided in the home environment. ? ?I shared with  her the option of a hospice facility that feels more like a home environment in the hospital setting.  We discussed the focus that hospice would be on her comfort and quality of life and supporting her through the end of her life.  We discussed the use of medications to promote her comfort.  We discussed the use of medications as we attempt to titrate oxygen down.  Thus allowing her family to visit freely and spend more time with her as she nears end-of-life.  Patient tells me she is very interested in this option and would like for [place] to arrange transition to hospice facility. ? ?Encouraged patient to consider DNR/DNI status understanding evidenced based poor outcomes in similar hospitalized patients, as the cause of the arrest is likely associated with chronic/terminal disease rather than a reversible acute cardio-pulmonary event.  Patient agrees that DNR is appropriate. ? ?Patient tells me she feels that she is breathing well and is not experiencing any pain or shortness of breath on the high flow nasal cannula.  No symptom management needs at this time.  Discussed that I will add as needed IV medication for pain and shortness of breath. ? ?I asked patient if she feels her family will agree with her decisions and she tells me she believes her daughter [name] will.  She agrees that I may call [name] and discussed the above. ? ?Telephone call to [facility] later in the day.  [name] shares about the previous interactions with the palliative team telling me that patient has consistently requested full code.  She tells me of their previous experiences with hospice when patient's spouse passed  away. ? ?I shared with [name] patient's above-stated wishes; we discussed her desire to focus on her comfort and be with her family.  We discussed patient's desire to transition to the hospice facility.  We discussed that patient has agreed to DNR status.  Initially [name] seems shocked by the conversation but as we discussed further  she tells me that the patient has been tired and she has been expecting this for some time and trying to prepare herself and other family members for this.  We reviewed concept of honoring patient's wishes at this time and spending what ever time is left focusing on comfort and quality time with her.  I shared with [name] that I will change patient's CODE STATUS to DNR and she agrees.  I shared that we will start working on a transition to the hospice facility and she agrees with this as well.  [name] tells me she will be at patient's bedside tomorrow morning and I tell her I will follow-up with her then to address any lingering questions or concerns she may have. ? ?Discussed all of the above with hospice liaisons as they prepare to arrange patient's care at hospice facility ? ?Primary Decision Maker ?PATIENT ?  ? ?SUMMARY OF RECOMMENDATIONS   ?-Hospice facility when bed available ?-CODE STATUS changed to DNR ?-Added as needed medications IV for pain or dyspnea ?-Patient's daughter [name] has agreed to this plan though she does express concern that other family members may not agree however patient is able to make her own decisions independently at this time ? ?Code Status/Advance Care Planning: ?DNR ? ?Discharge Planning: Hospice facility  ? ?  ? ?Primary Diagnoses: ?Present on Admission: ? Acute on chronic respiratory failure with hypoxia ([place]) ? Acute right-sided CHF (congestive heart failure) ([place]) ? Essential (primary) hypertension ? Type 2 diabetes mellitus with hyperlipidemia ([place]) ? Breast mass ? Tobacco abuse ? PAF (paroxysmal atrial fibrillation) ([place]) ? ILD (interstitial lung disease) ([place]) ? Sacral decubitus ulcer ? ? ?I have reviewed the medical record, interviewed the patient and family, and examined the patient. The following aspects are pertinent. ? ?Past Medical History:  ?Diagnosis Date  ? Acute on chronic diastolic (congestive) heart failure ([place]) 05/19/2021  ? Acute respiratory failure with  hypoxia ([place]) 09/06/2020  ? Atrial fibrillation with RVR ([place])   ? Diabetes mellitus without complication ([place])   ? Hyperlipidemia   ? Hypertension   ? Hypothyroidism   ? IPF (idiopathic pulmonary fibrosis) ([place])   ? Pneumonia due to COVID-19 virus 01/16/2021  ? Thrombocytopenia ([place])   ? ?Social History  ? ?Socioeconomic History  ? Marital status: Widowed  ?  Spouse name: Not on file  ? Number of children: Not on file  ? Years of education: Not on file  ? Highest education level: Not on file  ?Occupational History  ? Occupation: retired  ?Tobacco Use  ? Smoking status: Some Days  ?  Packs/day: 1.00  ?  Years: 60.00  ?  Pack years: 60.00  ?  Types: Cigarettes  ?  Start date: 12/11/1958  ? Smokeless tobacco: Former  ?  Quit date: 09/24/1962  ? Tobacco comments:  ?  3cigs per day as of 12/15/21 ep  ?Vaping Use  ? Vaping Use: Never used  ?Substance and Sexual Activity  ? Alcohol use: Yes  ?  Comment: drinks beer 12 every 2 weeks  ? Drug use: No  ? Sexual activity: Not on file  ?Other Topics Concern  ? Not  on file  ?Social History Narrative  ? Not on file  ? ?Social Determinants of Health  ? ?Financial Resource Strain: Not on file  ?Food Insecurity: No Food Insecurity  ? Worried About Charity fundraiser in the Last Year: Never true  ? Ran Out of Food in the Last Year: Never true  ?Transportation Needs: No Transportation Needs  ? Lack of Transportation (Medical): No  ? Lack of Transportation (Non-Medical): No  ?Physical Activity: Not on file  ?Stress: Not on file  ?Social Connections: Not on file  ? ?Family History  ?Problem Relation Age of Onset  ? Diabetes Mother   ? Kidney disease Mother   ? Hypertension Mother   ? Other Father   ?     tuberculosis  ? Hypertension Sister   ? Hyperlipidemia Sister   ? ?Scheduled Meds: ? amiodarone  200 mg Oral Daily  ? apixaban  5 mg Oral BID  ? arformoterol  15 mcg Nebulization BID  ? budesonide (PULMICORT) nebulizer solution  0.25 mg Nebulization BID  ? DULoxetine  30 mg Oral Daily  ?  furosemide  40 mg Oral BID  ? guaiFENesin  600 mg Oral BID  ? insulin aspart  0-9 Units Subcutaneous Q4H  ? insulin glargine-yfgn  5 Units Subcutaneous QHS  ? levothyroxine  75 mcg Oral Q0600  ? lidocaine

## 2022-03-02 ENCOUNTER — Other Ambulatory Visit: Payer: Self-pay

## 2022-03-02 DIAGNOSIS — I50811 Acute right heart failure: Secondary | ICD-10-CM | POA: Diagnosis not present

## 2022-03-02 DIAGNOSIS — N63 Unspecified lump in unspecified breast: Secondary | ICD-10-CM | POA: Diagnosis not present

## 2022-03-02 DIAGNOSIS — Z515 Encounter for palliative care: Secondary | ICD-10-CM | POA: Diagnosis not present

## 2022-03-02 DIAGNOSIS — Z7189 Other specified counseling: Secondary | ICD-10-CM | POA: Diagnosis not present

## 2022-03-02 DIAGNOSIS — I10 Essential (primary) hypertension: Secondary | ICD-10-CM | POA: Diagnosis not present

## 2022-03-02 DIAGNOSIS — J9621 Acute and chronic respiratory failure with hypoxia: Secondary | ICD-10-CM | POA: Diagnosis not present

## 2022-03-02 LAB — CBC
HCT: 43.8 % (ref 36.0–46.0)
Hemoglobin: 14.6 g/dL (ref 12.0–15.0)
MCH: 33.5 pg (ref 26.0–34.0)
MCHC: 33.3 g/dL (ref 30.0–36.0)
MCV: 100.5 fL — ABNORMAL HIGH (ref 80.0–100.0)
Platelets: 117 10*3/uL — ABNORMAL LOW (ref 150–400)
RBC: 4.36 MIL/uL (ref 3.87–5.11)
RDW: 16.5 % — ABNORMAL HIGH (ref 11.5–15.5)
WBC: 9.7 10*3/uL (ref 4.0–10.5)
nRBC: 0 % (ref 0.0–0.2)

## 2022-03-02 LAB — BASIC METABOLIC PANEL
Anion gap: 8 (ref 5–15)
BUN: 17 mg/dL (ref 8–23)
CO2: 27 mmol/L (ref 22–32)
Calcium: 8.6 mg/dL — ABNORMAL LOW (ref 8.9–10.3)
Chloride: 102 mmol/L (ref 98–111)
Creatinine, Ser: 0.85 mg/dL (ref 0.44–1.00)
GFR, Estimated: >60 mL/min (ref >60)
Glucose, Bld: 114 mg/dL — ABNORMAL HIGH (ref 70–99)
Potassium: 4.1 mmol/L (ref 3.5–5.1)
Sodium: 137 mmol/L (ref 135–145)

## 2022-03-02 LAB — GLUCOSE, CAPILLARY
Glucose-Capillary: 108 mg/dL — ABNORMAL HIGH (ref 70–99)
Glucose-Capillary: 120 mg/dL — ABNORMAL HIGH (ref 70–99)
Glucose-Capillary: 128 mg/dL — ABNORMAL HIGH (ref 70–99)
Glucose-Capillary: 169 mg/dL — ABNORMAL HIGH (ref 70–99)

## 2022-03-02 LAB — MAGNESIUM: Magnesium: 2 mg/dL (ref 1.7–2.4)

## 2022-03-02 MED ORDER — MORPHINE SULFATE 15 MG PO TABS: 15.0000 mg | ORAL_TABLET | ORAL | Status: AC | PRN

## 2022-03-02 MED ORDER — FUROSEMIDE 20 MG PO TABS: 40.0000 mg | ORAL_TABLET | Freq: Two times a day (BID) | ORAL | Status: AC

## 2022-03-02 MED ORDER — IPRATROPIUM-ALBUTEROL 0.5-2.5 (3) MG/3ML IN SOLN: 3.0000 mL | RESPIRATORY_TRACT | Status: AC | PRN

## 2022-03-02 MED ORDER — BUDESONIDE 0.25 MG/2ML IN SUSP: 0.2500 mg | Freq: Two times a day (BID) | RESPIRATORY_TRACT | Status: AC

## 2022-03-02 MED ORDER — INSULIN ASPART 100 UNIT/ML IJ SOLN: 0.0000 [IU] | Freq: Three times a day (TID) | INTRAMUSCULAR | Status: DC

## 2022-03-02 MED ORDER — INSULIN GLARGINE 100 UNIT/ML ~~LOC~~ SOLN: 5.0000 [IU] | Freq: Every day | SUBCUTANEOUS | Status: AC

## 2022-03-02 NOTE — TOC Transition Note (Addendum)
Transition of Care (TOC) - CM/SW Discharge Note ? ? ?Patient Details  ?Name: Elizabeth Melton ?MRN: [id number] ?Date of Birth: 08/27/1941 ? ?Transition of Care (TOC) CM/SW Contact:  ?Milas Gain, LCSWA ?Phone Number: ?03/02/2022, 11:11 AM ? ? ?Clinical Narrative:    ? ? [facility] confirmed [employer] bed today for patient. MD,RN, and patients daughter was informed. ? ?Patient will DC to: [facility] ? ?Anticipated DC date: 03/02/2022 ? ?Family notified: Elizabeth Melton  ? ?Transport by: Corey Harold ? ?? ? ?Per MD patient ready for DC to [facility]. RN, patient, [facility],patient's family, and facility notified of DC. Discharge Summary sent to facility. RN given number for report [phone] . DC packet on chart. DNR signed by MD attached to patients hard chart.Ambulance transport requested for patient. ? ?CSW signing off.  ? ?Final next level of care: [place] ?Barriers to Discharge: No Barriers Identified ? ? ?Patient Goals and CMS Choice ?Patient states their goals for this hospitalization and ongoing recovery are:: [place] ?CMS Medicare.gov Compare Post Acute Care list provided to:: Patient Represenative (must comment) (daughter sylvia) ?Choice offered to / list presented to : Adult Children Elizabeth Melton) ? ?Discharge Placement ?  ?           ?Patient chooses bed at:  [facility]) ?Patient to be transferred to facility by: [facility] ?Name of family member notified: Elizabeth Melton ?Patient and family notified of of transfer: 03/02/22 ? ?Discharge Plan and Services ?  ?  ?           ?  ?  ?  ?  ?  ?  ?  ?  ?  ?  ? ?Social Determinants of Health (SDOH) Interventions ?  ? ? ?Readmission Risk Interventions ? ?  02/15/2022  ?  3:22 PM 09/10/2020  ? 12:21 PM  ?Readmission Risk Prevention Plan  ?Post Dischage Appt  Complete  ?Medication Screening  Complete  ?Transportation Screening Complete Complete  ?Medication Review Press photographer) Complete   ?PCP or Specialist appointment within 3-5 days of discharge Complete   ?[place]  or Home Care Consult Complete   ?SW Recovery Care/Counseling Consult Complete   ?Palliative Care Screening Not Applicable   ?[place] Not Applicable   ? ? ? ? ? ?

## 2022-03-02 NOTE — Discharge Summary (Addendum)
?Physician Discharge Summary ?  ?Patient: Elizabeth Melton MRN: [id number] DOB: [birth year]  ?Admit date:     02/26/2022  ?Discharge date: 03/02/22  ?Discharge Physician: Elizabeth Melton  ? ?PCP: Elizabeth Lass, MD  ? ?Recommendations at discharge:  ? ?Follow-up plan as per hospice care provider. ? ?Discharge Diagnoses: ?Principal Problem: ?  Acute on chronic respiratory failure with hypoxia (HCC) ?Active Problems: ?  Acute right-sided CHF (congestive heart failure) ([place]) ?  ILD (interstitial lung disease) ([place]) ?  PAF (paroxysmal atrial fibrillation) ([place]) ?  Type 2 diabetes mellitus with hyperlipidemia ([place]) ?  Essential (primary) hypertension ?  Tobacco abuse ?  Breast mass ?  Sacral decubitus ulcer ?  Goals of care, counseling/discussion ? ?Resolved Problems: ?  * No resolved hospital problems. * ? ?Hospital Course: ?Elizabeth Melton is a 81 y.o. female with medical history significant of atrial fibrillation, pulmonary embolism, hypertension, diastolic congestive heart failure, pulmonary hypertension, history of idiopathic pulmonary fibrosis with UIP who was recently admitted to earlier this month presented to the hospital for acute respiratory distress.  She was discharged home on 6 L of oxygen on 02/23/2022.  This time, patient presented with severe hypoxic respiratory failure with elevated blood pressure.  Patient received nitroglycerin and CPAP with mild improvement.  In the ED patient remained BiPAP dependent and was given 40 mg of Lasix and was admitted hospital for further evaluation and treatment.  During hospitalization, patient was also seen by pulmonary and patient was initiated on IV steroids and required high flow nasal cannula at 25 L/min.  Palliative care also saw the patient during hospitalization.  At this time plan is transfer to residential hospice. ? ?Assessment and Plan: ?* Acute on chronic respiratory failure with hypoxia (HCC) ?Recurrent admission in the past for the same..  Diagnosis likely  acute exacerbation of pulmonary fibrosis with congestive heart failure.  Received BiPAP during hospitalization and persist to remain on high flow nasal cannula 25 L or more.  Patient is extremely hypoxic even on minimal exertion.  Pulmonary also followed the patient during hospitalization.  At this time patient has overall declined. CT chest on 03/01/2022 showed emphysema and fibrosis with bilateral trace effusion and pulmonary nodular densities.  BNP elevated at 500.  Patient received IV steroids during hospitalization.  Palliative care was also consulted.  At this time plan is to transition to residential hospice. ? ?Acute right-sided CHF (congestive heart failure) ([place]) ?Chronic diastolic heart failure ?Possibility of right-sided heart failure with elevated BNP and underlying pulmonary fibrosis.  Patient received IV Lasix during hospitalization.  Will transition to oral Lasix twice daily on discharge. ? ?PAF (paroxysmal atrial fibrillation) ([place]) ?On amiodarone and Eliquis as outpatient. ? ?ILD (interstitial lung disease) ([place]) ?Does have history of IPF with UIP.  Advanced disease with poor cardiopulmonary functioning.  Pulmonary saw the patient during hospitalization.  On high flow nasal cannula 25 L or so with extreme dyspnea on exertion in hypoxia.  Plans to transition to hospice level of care.   ? ?Sacral decubitus ulcer ?Continue wound care. ? ?Breast mass ? mammogram with 64m mass plan for hospice care ? ?Tobacco abuse ?on nicotine patch. ? ?Essential (primary) hypertension ?Continue Lasix on discharge ? ?Type 2 diabetes mellitus with hyperlipidemia ([place] ?On insulin regimen at home ? ?Goals of care, counseling/discussion ?DNR/DNI.  Patient has been seen by palliative care.  Poor prognosis.  Will be transitioned to residential hospice. ? ? ?Consultants:  ?pulmonary,  ?palliative care ? ?Procedures performed: BiPAP, ?Disposition:  Hospice care ?Diet recommendation:  ?Discharge Diet Orders (From admission,  onward)  ? ?  Start     Ordered  ? 03/02/22 0000  Diet general       ? 03/02/22 1054  ? ?  ?  ? ?  ? ?Regular diet ?DISCHARGE MEDICATION: ?Allergies as of 03/02/2022   ? ?   Reactions  ? Lisinopril Other (See Comments)  ? Other reaction(s): cough 09/20/17  ? ?  ? ?  ?Medication List  ?  ? ?STOP taking these medications   ? ?lovastatin 40 MG tablet ?Commonly known as: MEVACOR ?  ?Methocarbamol 1000 MG Tabs ?  ?metoprolol succinate 25 MG 24 hr tablet ?Commonly known as: TOPROL-XL ?  ?OXYGEN ?  ? ?  ? ?TAKE these medications   ? ?acetaminophen 500 MG tablet ?Commonly known as: TYLENOL ?Take 1,000 mg by mouth every 8 (eight) hours as needed for headache or mild pain (pain). ?  ?albuterol 108 (90 Base) MCG/ACT inhaler ?Commonly known as: VENTOLIN HFA ?Inhale 2 puffs into the lungs every 6 (six) hours as needed for wheezing or shortness of breath. ?  ?albuterol (2.5 MG/3ML) 0.083% nebulizer solution ?Commonly known as: PROVENTIL ?Take 3 mLs by nebulization every 6 (six) hours as needed. ?  ?amiodarone 200 MG tablet ?Commonly known as: PACERONE ?Take 1 tablet by mouth twice daily until 02/26/22, then on 02/27/22 start taking 1 tablet daily. ?  ?apixaban 5 MG Tabs tablet ?Commonly known as: ELIQUIS ?Take 1 tablet (5 mg total) by mouth 2 (two) times daily. ?  ?bisacodyl 10 MG suppository ?Commonly known as: DULCOLAX ?Place 10 mg rectally daily as needed for moderate constipation. ?  ?Breztri Aerosphere 160-9-4.8 MCG/ACT Aero ?Generic drug: Budeson-Glycopyrrol-Formoterol ?Inhale 2 puffs into the lungs in the morning and at bedtime. ?  ?budesonide 0.25 MG/2ML nebulizer solution ?Commonly known as: PULMICORT ?Take 2 mLs (0.25 mg total) by nebulization 2 (two) times daily. ?  ?diphenhydrAMINE 25 MG tablet ?Commonly known as: BENADRYL ?Take 25 mg by mouth every 6 (six) hours as needed for allergies. ?  ?DULoxetine 30 MG capsule ?Commonly known as: CYMBALTA ?Take 1 capsule (30 mg total) by mouth daily. ?  ?furosemide 20 MG  tablet ?Commonly known as: LASIX ?Take 2 tablets (40 mg total) by mouth 2 (two) times daily. ?What changed:  ?how much to take ?when to take this ?  ?glucose blood test strip ?1 each by Other route as needed for other (blood sugar). ?  ?guaiFENesin 600 MG 12 hr tablet ?Commonly known as: [place] ?Take 1 tablet (600 mg total) by mouth 2 (two) times daily. ?What changed:  ?when to take this ?reasons to take this ?  ?insulin glargine 100 UNIT/ML injection ?Commonly known as: LANTUS ?Inject 0.05 mLs (5 Units total) into the skin at bedtime. ?What changed:  ?how much to take ?when to take this ?  ?ipratropium-albuterol 0.5-2.5 (3) MG/3ML Soln ?Commonly known as: DUONEB ?Take 3 mLs by nebulization every 4 (four) hours as needed (sob wheeze). ?  ?levothyroxine 75 MCG tablet ?Commonly known as: SYNTHROID ?Take 75 mcg by mouth daily before breakfast. ?  ?lidocaine 5 % ?Commonly known as: LIDODERM ?Place 1 patch onto the skin at bedtime. Remove & Discard patch within 12 hours or as directed by MD ?  ?morphine 15 MG tablet ?Commonly known as: MSIR ?Take 1 tablet (15 mg total) by mouth every 4 (four) hours as needed (SOB). ?  ?nicotine 14 mg/24hr patch ?Commonly known as: NICODERM CQ -  dosed in mg/24 hours ?Place 14 mg onto the skin daily. ?  ?nystatin cream ?Commonly known as: MYCOSTATIN ?Apply topically 2 (two) times daily. ?  ?pantoprazole 40 MG tablet ?Commonly known as: PROTONIX ?Take 1 tablet (40 mg total) by mouth daily. ?  ?PEN NEEDLES 29GX1/2" 29G X 12MM Misc ?For insulin injection ?  ?senna-docusate 8.6-50 MG tablet ?Commonly known as: Senokot-S ?Take 2 tablets by mouth at bedtime. ?What changed:  ?when to take this ?reasons to take this ?  ?Smart Sense Thin Lancets 26G Misc ?1 each by Does not apply route 2 (two) times daily. ?  ?traMADol 50 MG tablet ?Commonly known as: ULTRAM ?Take 1 tablet (50 mg total) by mouth every 6 (six) hours as needed for moderate pain. ?  ?True Metrix Meter w/Device Kit ?1 each by Other  route in the morning and at bedtime. ?  ? ?  ? ? ?Discharge Exam: ?Filed Weights  ? 02/28/22 0435 03/01/22 0431 03/02/22 0447  ?Weight: 73 kg 74.9 kg 68.5 kg  ? ? ?  03/02/2022  ?  8:31 AM 03/02/2022  ?  4:47 AM 3/23

## 2022-03-02 NOTE — Progress Notes (Signed)
?                                                   ?Daily Progress Note  ? ?Patient Name: Elizabeth Melton       Date: 03/02/2022 ?DOB: 09-Dec-1941  Age: 81 y.o. MRN#: [id number] ?Attending Physician: No att. providers found ?Primary Care Physician: Elizabeth Lass, MD ?Admit Date: 02/26/2022 ? ?Reason for Consultation/Follow-up: Establishing goals of care ? ?Subjective: ?Patient tells me her oxygen was increased but she still feels the same-does not feel short of breath.  No pain either.  Eating a biscuit from [employer] during my interaction with her-tolerating well. ? ?Length of Stay: 3 ? ?Current Medications: ?Scheduled Meds:  ? amiodarone  200 mg Oral Daily  ? apixaban  5 mg Oral BID  ? arformoterol  15 mcg Nebulization BID  ? budesonide (PULMICORT) nebulizer solution  0.25 mg Nebulization BID  ? DULoxetine  30 mg Oral Daily  ? furosemide  40 mg Oral BID  ? guaiFENesin  600 mg Oral BID  ? insulin aspart  0-9 Units Subcutaneous TID WC  ? insulin glargine-yfgn  5 Units Subcutaneous QHS  ? levothyroxine  75 mcg Oral Q0600  ? lidocaine  1 patch Transdermal QHS  ? nicotine  14 mg Transdermal Daily  ? pantoprazole  40 mg Oral Daily  ? revefenacin  175 mcg Nebulization Daily  ? senna-docusate  2 tablet Oral QHS  ? sodium chloride flush  3 mL Intravenous Q12H  ? Zinc Oxide   Topical TID  ? ? ?Continuous Infusions: ? sodium chloride    ? ? ?PRN Meds: ?sodium chloride, acetaminophen, bisacodyl, ipratropium-albuterol, morphine, morphine injection, ondansetron (ZOFRAN) IV, sodium chloride flush, traMADol ? ?Physical Exam ?Constitutional:   ?   General: She is not in acute distress. ?   Appearance: She is ill-appearing.  ?Pulmonary:  ?   Comments: High flow nasal cannula at 25 L ?Skin: ?   General: Skin is warm and dry.  ?Neurological:  ?   Mental Status: She is alert and oriented to  person, place, and time.  ?         ? ?Vital Signs: BP (!) 127/44 (BP Location: Right Arm)   Pulse (!) 58   Temp 97.8 ?F (36.6 ?C) (Oral)   Resp 14   Ht _0  (1.676 m)   Wt 68.5 kg   SpO2 91%   BMI 24.37 kg/m?  ?SpO2: SpO2: 91 % ?O2 Device: O2 Device: High Flow Nasal Cannula ?O2 Flow Rate: O2 Flow Rate (L/min): 25 L/min ? ?Intake/output summary:  ?Intake/Output Summary (Last 24 hours) at 03/02/2022 1422 ?Last data filed at 03/01/2022 2045 ?Gross per 24 hour  ?Intake 120 ml  ?Output --  ?Net 120 ml  ? ?LBM: Last BM Date :  (PTA) ?Baseline Weight: Weight: (!) 159 kg ?Most recent weight: Weight: 68.5 kg ? ?     ?Palliative Assessment/Data: ? ? ? ?Flowsheet Rows   ? ?Flowsheet Row Most Recent Value  ?Intake Tab   ?Referral Department Hospitalist  ?Unit at Time of Referral Intermediate Care Unit  ?Palliative Care Primary Diagnosis Pulmonary  ?Date Notified 03/01/22  ?Palliative Care Type Return patient Palliative Care  ?Reason for referral Clarify Goals of Care  ?Date of Admission 02/26/22  ?Date first seen by Palliative Care 03/01/22  ?#  of days Palliative referral response time 0 Day(s)  ?# of days IP prior to Palliative referral 3  ?Clinical Assessment   ?Psychosocial & Spiritual Assessment   ?Palliative Care Outcomes   ? ?  ? ? ?Patient Active Problem List  ? Diagnosis Date Noted  ? Goals of care, counseling/discussion 02/28/2022  ? Sacral decubitus ulcer 02/27/2022  ? Breast mass 02/12/2022  ? Atrial flutter with rapid ventricular response ([place]) 02/10/2022  ? PAF (paroxysmal atrial fibrillation) ([place]) 02/09/2022  ? Mood disorder ([place]) 02/09/2022  ? Chronic obstructive pulmonary disease with acute exacerbation ([place]) 01/26/2022  ? Acute renal failure superimposed on stage 3b chronic kidney disease ([place]) 01/26/2022  ? Thrombocytopenia ([place])   ? Elevated lactic acid level   ? Respiratory distress   ? Hypoxia   ? Low back pain 01/11/2022  ? Acute respiratory failure ([place]) 12/29/2021  ? COPD with chronic bronchitis  and emphysema ([place])   ? 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  12/28/2021  ? Hyperkalemia 12/28/2021  ? CKD (chronic kidney disease) 12/28/2021  ? Acute right-sided CHF (congestive heart failure) ([place]) 11/06/2021  ? Elevated troponin 11/03/2021  ? Constipation 11/03/2021  ? Pulmonary HTN ([place])   ? Acute on chronic systolic (congestive) heart failure ([place]) 05/19/2021  ? Acute on chronic respiratory failure with hypoxia ([place]) 05/19/2021  ? SOB (shortness of breath) 05/19/2021  ? AF (paroxysmal atrial fibrillation) ([place]) 05/19/2021  ? Acute on chronic congestive heart failure ([place])   ? Demand ischemia ([place])   ? Acute respiratory failure with hypoxia ([place]) 09/06/2020  ? 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 ILD (interstitial lung disease) ([place]) 10/23/2019  ? Chronic respiratory failure with hypoxia ([place]) 10/23/2019  ? Essential (primary) hypertension 09/18/2018  ? PAD (peripheral artery disease) ([place]) 09/18/2018  ? Tobacco abuse 09/18/2018  ? Aortic regurgitation 09/18/2018  ? Hypothyroidism   ? Hyperlipidemia/PAD   ? Type 2 diabetes mellitus with hyperlipidemia ([place])   ? ? ?Palliative Care Assessment & Plan  ? ?HPI: ?81 y.o. female  with past medical history of A-fib, PE, hypertension, CHF, pulmonary hypertension, pulmonary fibrosis, and multiple hospitalizations admitted on 02/26/2022 with acute respiratory distress.  Patient has had 5 hospital admissions in the last 6 months.  Patient was just discharged home March 16 and then readmitted on the 19th.  This admission respiratory failure thought to be exacerbation of pulmonary fibrosis versus congestive heart failure.  Patient requiring high flow nasal cannula.  PMT consulted to discuss goals of care. ? ?Assessment: ?Follow-up today at bedside with patient.  Her daughter Elizabeth Melton is at the bedside as well.  We reviewed conversation of yesterday to transition and focus on [name] comfort.  Patient and daughter both  agree to this plan.  They also both state they are agreement with the DNR.  We discussed her transition to hospice we discussed they can only provide 18 L.  We discussed minimizing effort as to preserve her energy and not decreased oxygen saturation.  Encouraged her to rely on staff to move her to EMS stretcher when they arrive.  We discussed discontinuing cardiac monitor and instead paying attention to patient instead of numbers on the screen.  Patient and daughter agree to this.  We again reviewed hospice philosophy of care and she agrees.  No symptom management needs at this time. ? ?Discussed plan of care with nurse and nursing later-to DC monitor.  Discussed careful transition to EMS stretcher on conserving  patient's energy.  Discussed transfer with hospice liaisons. ? ?Recommendations/Plan: ?Transition to hospice facility when bed available ?Maintain DNR ?As needed medications there is no symptom needs reported at this time ? ?Goals of Care and Additional Recommendations: ?Limitations on Scope of Treatment: Full Comfort Care ? ?Code Status: ?DNR ? ?Prognosis: ? < 2 weeks ? ?Discharge Planning: ?Hospice facility ? ?Care plan was discussed with patient, daughter, RN, hospice liaison's ? ?Thank you for allowing the Palliative Medicine Team to assist in the care of this patient. ? ?*Please note that this is a verbal dictation therefore any spelling or grammatical errors are due to the "[place] [facility]" system interpretation. ? ?Juel Burrow, DNP, AGNP-C ?Palliative Medicine Team ?Team Phone # [phone]  ?Pager [phone] ? ?

## 2022-03-02 NOTE — Progress Notes (Signed)
Report called to [facility] receiving RN,  Pam.  ?

## 2022-03-02 NOTE — Progress Notes (Signed)
[facility]) Hospital Liaison note: ? ?[facility] bed offered and accepted. Consents signed and completed. Bedside nurse asked for order to keep PIV in place. MD notified. [facility] called and 1pm transport arranged. RN please call report to [phone] prior to patient leaving the unit. Please send signed and completed DNR with patient at discharge. Please do not hesitate to call with questions. Thank you! ? ?Lorelee Market, LPN ?[facility] Hospital Liaison  ?[phone] ?

## 2022-03-02 NOTE — Care Management Important Message (Signed)
Important Message ? ?Patient Details  ?Name: Elizabeth Melton ?MRN: [id number] ?Date of Birth: [birth year] ? ? ?Medicare Important Message Given:  Yes ? ? ? ? ?[name] ?03/02/2022, 9:30 AM ?

## 2022-03-02 NOTE — Patient Outreach (Signed)
[facility]) Care Management ? ?03/02/2022 ? ?Taunya L Kolbe ?March 14, 1941 ?[id number] ? ? ?Case Closure ? ? ? ?Per inpatient note patient transferring to [facility] today. [facility] does not follow hospice patients. ? ? ? ?Plan: ?RN CM will close case at this time. ? ? ? ?Enzo Montgomery, RN,BSN,CCM ?[facility] Care Management ?Telephonic Care Management Coordinator ?Direct Phone: [phone] ?Toll Free: [phone] ?Fax: [phone] ? ?

## 2022-03-06 NOTE — Telephone Encounter (Addendum)
Patient is scheduled for CPAP Titration on 04/02/22. ?Left detailed message on voicemail with date and time of titration and informed patient to call back to confirm or reschedule.  ?

## 2022-03-09 ENCOUNTER — Other Ambulatory Visit: Payer: Self-pay

## 2022-03-09 NOTE — Patient Outreach (Signed)
[facility]) Care Management ? ?03/09/2022 ? ?Elizabeth Melton ?05/01/1941 ?[id number] ? ? ? ? ?Multidisciplinary Case Discussion ?Date of Review: 03/09/2022 ?Reason: 30 Day Readmission ?PCP:Dr. Kathyrn Lass ?Insurance:[name] ? ? ? ?Medical Info: Ms. Needle is a 81 yo female with the past medical history of COPD, heart failure, pulmonary hypertension, atrial fibrillation, DM, smoker, pulmonary embolism (on anti-coagulation therapy) and chronic hypoxemic respiratory failure due to pulmonary fibrosis. ? ? ?Admissions: She presented to the ED on 02/09/22 with worsening dyspnea after [place] called EMS while in the patient home due to low oxygen sats, SOB and elevated heart rate.   Patient tested positive for COVID-19 and PNA and was treated with appropriate therapy. CT scan showed emphysema and pulmonary fibrosis. Patient treated with resp meds, steroids and abx. Incidentally noted on CT scan was right breast mass and multiple pulmonary nodules and patient set up for outpatient workup.    ?She was discharged home 02/21/22 on high flow oxygen(6L) along with [facility] services (RN, PT, OT) Patient was also already receiving palliative care services in the home.  ?RN attempted to reach patient post discharge but was unsuccessful.  ?Patient then presented back to the ED on 02/26/22 for respiratory distress. Pt with severe hypoxic resp failure, HTN with SBPs in the 200s per EMS.  Given NTG x3 and put on CPAP with marked improvement. Patient remained on high level oxygen as well as treated with IV diuretics and steroids. ?Palliative care saw patient on 2/17. Patient/family wanted ?everything done?-full code. However, during most recent admission goals of care discussed with pt/family. They were now agreeable to hospice services and DNR status as she has poor prognosis. Patient on 20L of oxygen and unable to return home due to extent of medical care needs.  ? ? ?Disposition: Patient was discharged to [facility] on 03/28/22. Patient passed away on 03/31/23.  ? ?RN CM Follow Up: None. ? ? ?Enzo Montgomery, RN,BSN,CCM ?[name] Care Management ?Telephonic Care Management Coordinator ?Direct Phone: [phone] ?Toll Free: [phone] ?Fax: [phone]  ?

## 2022-03-11 DEATH — deceased

## 2022-03-14 ENCOUNTER — Ambulatory Visit (HOSPITAL_COMMUNITY): Payer: Medicare HMO | Admitting: Nurse Practitioner

## 2022-03-16 ENCOUNTER — Ambulatory Visit: Payer: No Typology Code available for payment source | Admitting: Internal Medicine

## 2022-03-28 ENCOUNTER — Encounter (HOSPITAL_COMMUNITY): Payer: No Typology Code available for payment source | Admitting: Cardiology

## 2022-04-02 ENCOUNTER — Encounter (HOSPITAL_BASED_OUTPATIENT_CLINIC_OR_DEPARTMENT_OTHER): Payer: Medicare HMO | Admitting: Cardiology

## 2022-10-02 NOTE — Telephone Encounter (Signed)
12/23/21  You  Douthitt, Yeni L [phone]  9 months ago   [place] Left vm requesting pt increase her O2 to 5 L [place] and that per Dr Golden Pop direction she needs to be emergently evaluated in the hospital. Left phone number for direct call back.   Outgoing call   Damaris Hippo FNP-C
